# Patient Record
Sex: Male | Born: 1935 | Race: Black or African American | Hispanic: No | Smoking: Former smoker
Health system: Southern US, Community
[De-identification: ages and names within clinical notes are randomized; demographics above are authoritative.]

## PROBLEM LIST (undated history)

## (undated) DIAGNOSIS — R339 Retention of urine, unspecified: Secondary | ICD-10-CM

## (undated) DIAGNOSIS — M1990 Unspecified osteoarthritis, unspecified site: Secondary | ICD-10-CM

## (undated) DIAGNOSIS — I10 Essential (primary) hypertension: Secondary | ICD-10-CM

## (undated) DIAGNOSIS — E0590 Thyrotoxicosis, unspecified without thyrotoxic crisis or storm: Secondary | ICD-10-CM

## (undated) HISTORY — PX: NO PAST SURGERIES: SHX2092

---

## 2009-04-27 ENCOUNTER — Emergency Department (HOSPITAL_COMMUNITY): Admission: EM | Admit: 2009-04-27 | Discharge: 2009-04-27 | Payer: Self-pay | Admitting: Emergency Medicine

## 2009-05-14 ENCOUNTER — Inpatient Hospital Stay (HOSPITAL_COMMUNITY): Admission: AD | Admit: 2009-05-14 | Discharge: 2009-05-20 | Payer: Self-pay | Admitting: Family Medicine

## 2009-05-17 ENCOUNTER — Encounter (INDEPENDENT_AMBULATORY_CARE_PROVIDER_SITE_OTHER): Payer: Self-pay | Admitting: Urology

## 2011-01-03 LAB — BASIC METABOLIC PANEL
BUN: 27 mg/dL — ABNORMAL HIGH (ref 6–23)
CO2: 23 mEq/L (ref 19–32)
CO2: 24 mEq/L (ref 19–32)
Calcium: 8 mg/dL — ABNORMAL LOW (ref 8.4–10.5)
Chloride: 111 mEq/L (ref 96–112)
Chloride: 123 mEq/L — ABNORMAL HIGH (ref 96–112)
GFR calc Af Amer: 37 mL/min — ABNORMAL LOW (ref >60)
GFR calc Af Amer: >60 mL/min (ref >60)
GFR calc Af Amer: >60 mL/min (ref >60)
GFR calc non Af Amer: 5 mL/min — ABNORMAL LOW (ref >60)
GFR calc non Af Amer: 55 mL/min — ABNORMAL LOW (ref >60)
Glucose, Bld: 113 mg/dL — ABNORMAL HIGH (ref 70–99)
Glucose, Bld: 89 mg/dL (ref 70–99)
Glucose, Bld: 94 mg/dL (ref 70–99)
Potassium: 3.7 mEq/L (ref 3.5–5.1)
Potassium: 3.7 mEq/L (ref 3.5–5.1)
Potassium: 3.9 mEq/L (ref 3.5–5.1)
Potassium: 4.1 mEq/L (ref 3.5–5.1)
Sodium: 137 mEq/L (ref 135–145)
Sodium: 137 mEq/L (ref 135–145)
Sodium: 149 mEq/L — ABNORMAL HIGH (ref 135–145)
Sodium: 150 mEq/L — ABNORMAL HIGH (ref 135–145)

## 2011-01-03 LAB — URINE MICROSCOPIC-ADD ON

## 2011-01-03 LAB — URINALYSIS, ROUTINE W REFLEX MICROSCOPIC
Glucose, UA: NEGATIVE mg/dL
Glucose, UA: NEGATIVE mg/dL
Leukocytes, UA: NEGATIVE
Specific Gravity, Urine: 1.025 (ref 1.005–1.030)
pH: 6 (ref 5.0–8.0)
pH: 6 (ref 5.0–8.0)

## 2011-01-03 LAB — CBC
HCT: 31.2 % — ABNORMAL LOW (ref 39.0–52.0)
Hemoglobin: 10.6 g/dL — ABNORMAL LOW (ref 13.0–17.0)
MCHC: 34.1 g/dL (ref 30.0–36.0)
RBC: 3.63 MIL/uL — ABNORMAL LOW (ref 4.22–5.81)
RDW: 15.5 % (ref 11.5–15.5)

## 2011-01-03 LAB — COMPREHENSIVE METABOLIC PANEL
Albumin: 2.4 g/dL — ABNORMAL LOW (ref 3.5–5.2)
BUN: 11 mg/dL (ref 6–23)
Creatinine, Ser: 1.35 mg/dL (ref 0.4–1.5)
Total Protein: 5.8 g/dL — ABNORMAL LOW (ref 6.0–8.3)

## 2011-01-03 LAB — DIFFERENTIAL
Basophils Relative: 0 % (ref 0–1)
Eosinophils Absolute: 0.5 10*3/uL (ref 0.0–0.7)
Eosinophils Relative: 9 % — ABNORMAL HIGH (ref 0–5)
Lymphocytes Relative: 22 % (ref 12–46)
Neutro Abs: 3 10*3/uL (ref 1.7–7.7)

## 2011-01-03 LAB — URINE CULTURE: Culture: NO GROWTH

## 2011-01-04 LAB — URINALYSIS, ROUTINE W REFLEX MICROSCOPIC
Bilirubin Urine: NEGATIVE
Glucose, UA: NEGATIVE mg/dL
Specific Gravity, Urine: 1.015 (ref 1.005–1.030)
pH: 7 (ref 5.0–8.0)

## 2011-01-04 LAB — URINE MICROSCOPIC-ADD ON

## 2011-02-10 NOTE — Discharge Summary (Signed)
NAMELACEY, CHAUSSEE NO.:  [id number]   MEDICAL RECORD NO.:  [id number]          PATIENT TYPE:  INP   LOCATION:  A209                          FACILITY:  [facility]   PHYSICIAN:  Unk Lightning, MDDATE OF BIRTH:  December 28, 1932   DATE OF ADMISSION:  05/14/2009  DATE OF DISCHARGE:  08/23/2010LH                               DISCHARGE SUMMARY   The patient is a 77 year old black male with a history of elevated PSA,  bladder outlet obstruction, hypertension, who had difficulty urinating.  He had a labs drawn, had elevated BUN, and creatinine.  Creatinine was  14 and was essentially bladder outlet obstruction and post obstructive  acute renal failure and Foley was inserted.  He was seen in consultation  by renal and by Urology.  A decompressive renal function returned to  normal over 5 day.  He subsequently had a TURP done by Dr. Michela Pitcher,  hemodynamically stable.  His only problem was hypotension which was  controlled on Norvasc 10 and clonidine 0.2 mg p.o. b.i.d.  He continued  to do well.  He was continued on Flomax 0.4 mg p.o. daily.  He had some  UTI symptoms.  In addition, his lisinopril was continued to 10 mg p.o.  daily.  The patient's renal function returned to normal with BUN of 10  and creatinine of 1.34 on the day of discharge and was subsequently able  to follow up in the office in 4 days time with a Foley inserted.      Unk Lightning, MD  Electronically Signed     RMD/MEDQ  D:  05/20/2009  T:  05/21/2009  Job:  [id number]

## 2011-02-10 NOTE — Op Note (Signed)
NAMEZADE, NORDHOFF NO.:  [id number]   MEDICAL RECORD NO.:  [id number]          PATIENT TYPE:  INP   LOCATION:  A209                          FACILITY:  [facility]   PHYSICIAN:  Marissa Nestle, M.D.DATE OF BIRTH:  [birth year]   DATE OF PROCEDURE:  DATE OF DISCHARGE:                               OPERATIVE REPORT   PREOPERATIVE DIAGNOSIS:  Acute urinary retention.   POSTOPERATIVE DIAGNOSIS:  Benign prostatic hypertrophy with bladder neck  obstruction.   PROCEDURE:  Cystoscopy.   PROCEDURE:  The patient placed in lithotomy position.  After usual prep  and drape, Xylocaine Jelly instilled into the urethra.  After waiting  adequate time, flexible cystoscope was introduced under direct vision.  Anterior urethra looks normal.  Prostatic urethra was completely  obstructed with a lateral lobe hypertrophy.  There was a lot of  inflammatory reaction in the bladder, posterior bladder wall, and the  trigone, but there was no gross tumor in the bladder.  Cystoscope was  removed.  Foley catheter was reinserted.  The patient left the operating  room in satisfactory condition.      Marissa Nestle, M.D.  Electronically Signed     MIJ/MEDQ  D:  05/15/2009  T:  05/16/2009  Job:  [id number]   cc:   Lorriane Shire, MD

## 2011-02-10 NOTE — Group Therapy Note (Signed)
NAMEALMANZO, REDLER NO.:  [id number]   MEDICAL RECORD NO.:  [id number]          PATIENT TYPE:  INP   LOCATION:  A209                          FACILITY:  [facility]   PHYSICIAN:  Estill Bamberg. Karie Kirks, M.D.DATE OF BIRTH:  1933/09/25   DATE OF PROCEDURE:  DATE OF DISCHARGE:                                 PROGRESS NOTE   SUBJECTIVE:  He is generally feeling well.  Did have a TURP for BPH done  2 days ago.   CURRENT MEDICATIONS:  1. Amlodipine 10 mg daily.  2. Cipro 200 mg daily.  3. Clonidine 0.2 mg b.i.d.  4. Lovenox 30 mg daily.  5. Lisinopril 10 mg daily.  6. KCl 40 mEq daily.  7. Tamsulosin 0.4 mg daily.   Admission 4 days ago, his BUN 78, creatinine 11.2,  PSA of 28, and TSH  8.9.  he suppose to be on levothyroxine 15 mcg daily and Azor 10/40 mg  daily, but there is a question of compliant.  Today, the patient really  feels much better and he has not some time.   OBJECTIVE:  VITAL SIGNS:  Temperature is 99.4, pulse of 72, respiratory  rate 16, blood pressure 140/70.  GENERAL:  Sitting up in a chair, having just done a shaving.  He is in  no acute distress.  Well developed, oriented, and alert.  LUNGS:  Are clear throughout moving air well.  HEART:  Regular rhythm, rate of about 70.  ABDOMEN:  Soft without organomegaly or mass or tenderness.  He is no  edema of his ankles.   His BUN today is 10 and creatinine 1.34.  Renal function gradually  improved since admission.  Urine C and S has been negative.   ASSESSMENT:  1. Chronic renal failure secondary to obstructive uropathy.  2. Obstructive uropathy, status post transurethral resection of      prostate 2 days ago.  3. Benign essential hypertension.  4. Hypothyroidism.  5. PSA elevation, which could be secondary to benign prostatic      hypertrophy were to prostate carcinoma.   PLAN:  Continue current medications.  Add back levothyroxine 50 mcg  daily.  Hopefully, his Foley catheter will be able to be  removed either  today or tomorrow.  Probably will not need tamsulosin long term, now he  has had his TURP.      Estill Bamberg. Karie Kirks, M.D.  Electronically Signed     SDK/MEDQ  D:  05/19/2009  T:  05/20/2009  Job:  [id number]

## 2011-02-10 NOTE — Consult Note (Signed)
[name], [name] NO.:  [id number]   MEDICAL RECORD NO.:  [id number]          PATIENT TYPE:  INP   LOCATION:  A209                          FACILITY:  [facility]   PHYSICIAN:  Alison Murray, M.D.DATE OF BIRTH:  01-18-1933   DATE OF CONSULTATION:  05/16/2009  DATE OF DISCHARGE:                                 CONSULTATION   REASON FOR CONSULT:  Renal failure and hypernatremia.   ATTENDING PHYSICIAN:  Unk Lightning, MD   [name] is a 77 years old gentleman who has a history of  hypertension and hypothyroidism.  Initially, he was seen in July of this  year for some abdominal discomfort and unable to go to the bathroom.  During that time, he was found to have urinary tract infection and put  on antibiotics.  Presently, he was brought back because of similar  problem and since the patient was found to have elevated PSA and urinary  obstruction and Dr. Delfino Lovett was called, he will a cystoscopy and put a  catheter and his renal function at this moment seems to be improving.  [name] denies any previous history of renal failure.  He does not  have any nausea or vomiting.   PAST MEDICAL HISTORY:  1. History of hypertension.  He has been on Azor 10/40 mg p.o. daily.  2. History of hypothyroidism.  He was on Synthroid.  3. History of erectile dysfunction.  4. Elevated PSA.  5. History of noncompliance with medications.   ALLERGIES:  No known allergy.   SOCIAL HISTORY:  He does not have any history of smoking.  He quit about  20 years ago.   His medications at this moment consist of:  1. Norvasc 10 mg p.o. daily.  2. Catapres 0.1 mg p.o. b.i.d.  3. Lovenox 30 mg subcu once a day.  4. Prinivil 10 mg p.o. daily.  5. He is getting normal saline at 10-20 mL per hour.  6. He is also on Flomax 0.4 mg p.o. daily.   REVIEW OF SYSTEMS:  At this moment, he offers no complaints.  He does  not have any nausea.  No vomiting.  No fever, chills, or  sweating.  Overall, the patient offers no complaints and he denies any swelling of  the legs.  At this moment, he does not have any urgency or frequency.  He does have any problems outright.   PHYSICAL EXAMINATION:  VITAL SIGNS:  His temperature is 97.1, blood  pressure 153/87, and pulse of 74.  HEENT:  No conjunctival pallor.  Non icterus.  NECK:  Supple.  No JVD.  CHEST:  Clear to auscultation.  No rales or rhonchi.  No egophony.  HEART:  Regular rate and rhythm.  No murmur.  No S3.  ABDOMEN:  Soft.  No palpable organomegaly and he does not have any  tenderness.   His urine output in the last 24 hours is about 4 L.  His sodium today is  150.  His sodium yesterday was 149, potassium 3.7, potassium was 4.1.  His BUN is 27, creatinine 2.15 today and yesterday it  was 78, 11.0, and  calcium of 8.6.  He has the urine done on May 14, 2009, with specific  gravity of 1.01, pH of 6, blood large trace protein, positive nitrite,  small leukocyte, few squamous white blood cell count is 21-50, and few  bacteria.  His CMET and CBC at this moment is not done.  He has  ultrasound which was done on May 14, 2009, the right kidney is 11.5.  He has some increased cortical echogenicity.  Left kidney 11.2, still  with increased echogenicity.  No solid mass.  During that time, the  patient had already a Foley catheter.   ASSESSMENT:  1. Renal failure.  At this moment seems to be acute since he has      increased echogenicity probably might have underlying chronic renal      failure associated with his high blood pressure.  The present      increase in BUN and creatinine seems to be possibly from      obstructive uropathy and seems to be his renal function has      improved significantly.  2. 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  3. History of hypertension.  Presently, the patient is on Norvasc 10      mg p.o. daily, Catapres 0.1 mg p.o. daily,  and Prinivil 10 mg p.o.      daily.  Blood pressure seems to be controlled very well.  4. History of benign prostatic hypertrophy, status post catheter      placement.  He is on Flomax.  5. History of hypothyroidism.  He is on Synthroid.  6. Erectile dysfunction.  7. History of urinary tract infection.  Presently, he is afebrile.   RECOMMENDATIONS:  Because of increasing sodium, we change his IV fluid  to half-normal saline at 175 mL per hour.  We will give him KCl 10 mEq  p.o. daily.  I would repeat his UA and we will do also CBC.  If his  renal function improved at normal level, probably the patient does not  have any further workup.  We will continue his other treatment and we  will follow the patient with you.      Alison Murray, M.D.  Electronically Signed     BB/MEDQ  D:  05/16/2009  T:  05/16/2009  Job:  [id number]

## 2011-02-10 NOTE — Op Note (Signed)
NAMEREA, PARCHEM NO.:  [id number]   MEDICAL RECORD NO.:  [id number]          PATIENT TYPE:  INP   LOCATION:  A209                          FACILITY:  [facility]   PHYSICIAN:  Marissa Nestle, M.D.DATE OF BIRTH:  10/21/1932   DATE OF PROCEDURE:  05/17/2009  DATE OF DISCHARGE:                               OPERATIVE REPORT   PREOPERATIVE DIAGNOSES:  Acute urinary retention, benign prostatic  hypertrophy.   POSTOPERATIVE DIAGNOSES:  Acute urinary retention, benign prostatic  hypertrophy.   PROCEDURE:  Transurethral resection of prostate.   ANESTHESIA:  Spinal.   DESCRIPTION OF PROCEDURE:  The patient was given spinal anesthesia in  lithotomy position.  After usual prep and drape, #28 Iglesias  resectoscope was introduced into the bladder.  The bladder was  inspected.  Resectoscope was pulled back in mid prostatic urethra.  The  bladder neck was circumferentially dissected down to the circular  fibers.  Resectoscope was pulled back at the level of the verumontanum,  rotated to 11 o'clock position.  Resection of the right lobe was done  between 11 and 7 o'clock position.  Similarly, the left lobe was  resected between 1 and 5 o'clock position.  There was a large amount of  tissue in the anterior midline which was resected next.  At the end, I  resected the posterior midline tissue along with the apical tissue, not  to injure the sphincter or the verumontanum.  Prostatic urethra was wide  open.  The bleeders were coagulated.  Chips were evacuated.  Resectoscope was removed and 22 three-way Foley catheter left in for  drainage.  CBI was started, which was clear.  The patient left the  operating room in satisfactory condition.      Marissa Nestle, M.D.  Electronically Signed     MIJ/MEDQ  D:  05/17/2009  T:  05/17/2009  Job:  [id number]   cc:   Unk Lightning, MD  Fax: [phone]

## 2011-02-10 NOTE — H&P (Signed)
[name], [name] NO.:  [id number]   MEDICAL RECORD NO.:  [id number]          PATIENT TYPE:  INP   LOCATION:  A209                          FACILITY:  [facility]   PHYSICIAN:  Unk Lightning, MDDATE OF BIRTH:  [birth year]   DATE OF ADMISSION:  05/14/2009  DATE OF DISCHARGE:  LH                              HISTORY & PHYSICAL   HISTORY OF PRESENT ILLNESS:  The patient is a 77 year old black male who  has been noncompliant with history of hypertension.  Basically he  presented to my office feeling well over several days ago, states he had  some frequency and dysuria and some difficulty voiding.  CMP, CBC and  PSA were taken.  Essentially, he had a grossly elevated BUN and  creatinine of 140 and 36 respectively.  He had symptoms consistent with  bladder obstruction, hyperkalemia 5.70 and a PSA of 28 with elevated TSH  of 8.9, uncontrolled hypertension.  The patient admitted for possible  bladder outlet obstruction, possible post obstructive uropathy, renal  failure, generalized nephrosclerosis and he will have a Foley inserted,  monitor fluid via nose, renal consult for possible dialysis if the renal  function does not improve and adequate control of hypertension.  The  patient denies angina, orthopnea, PND, dizziness or syncope.  He  specifically denies hematuria.   PAST MEDICAL HISTORY:  Significant for hypertension,  erectile  dysfunction, elevated PSA and chronic noncompliance.   CURRENT MEDICATIONS:  Azor 10/40 milligrams p.o. daily and Synthroid 50  mcg p.o. daily of which there is questionable compliance.   ALLERGIES:  He has no known allergies.   SOCIAL HISTORY:  He does not smoke at present.  He smoked for 20 years,  a pack a day.  He is retired.   PHYSICAL EXAMINATION:  Blood pressure is 170/84 pulses 82 and regular.  He is afebrile, respiratory rate is 16.  HEENT:  Eyes: PERRLA.  Extraocular muscles are intact.  NECK:  No JVD, no carotid  bruits, no thyromegaly or thyroid bruits.  LUNGS:  Show prolonged expiratory phase, no rales, wheezes or rhonchi  appreciable.  HEART: Regular rhythm.  No murmurs, gallops, heaves, thrills or rubs.  ABDOMEN:  Soft, nontender.  Bowel sounds are normoactive.  No guarding,  rebound, mass or organomegaly.  EXTREMITIES:  Trace to 1+ pedal edema.  NEUROLOGIC:  There is some mild lethargy.  Cranial nerves 2-12 grossly  intact. Patient moves all extremities, toes are downgoing.   IMPRESSION:  1. Acute on chronic renal failure.  2. Consider postobstructive uropathy, bladder outlet obstruction.  3. Consider renal failure secondary to hypertension.  4. Elevated PSA of 28, consider prostate carcinoma.  5. Hypothyroidism with elevated TSH.  6. Uncontrolled hypertension.   PLAN:  1. The plan is to admit, get renal and urology consults.  2. Insert Foley catheter.  3. Monitor strict I and O's, monitor serial renal functions.  4. Continue Flomax 0.4 mg p.o. daily.  5. Continue Norvasc 10.  6. Will stop Benicar.  7. Will stop the ARB aspect.  8. Get renal ultrasound, urinalysis and I will make further  recommendations as the database expands.      Unk Lightning, MD  Electronically Signed     RMD/MEDQ  D:  05/15/2009  T:  05/15/2009  Job:  [id number]

## 2014-03-14 ENCOUNTER — Other Ambulatory Visit (HOSPITAL_COMMUNITY): Payer: Self-pay | Admitting: Urology

## 2014-03-14 DIAGNOSIS — R3129 Other microscopic hematuria: Secondary | ICD-10-CM

## 2014-03-14 DIAGNOSIS — R972 Elevated prostate specific antigen [PSA]: Secondary | ICD-10-CM

## 2014-03-21 ENCOUNTER — Ambulatory Visit (HOSPITAL_COMMUNITY)
Admission: RE | Admit: 2014-03-21 | Discharge: 2014-03-21 | Disposition: A | Discharge status: Home / Self Care | Payer: PRIVATE HEALTH INSURANCE | Source: Ambulatory Visit | Attending: Urology | Admitting: Urology

## 2014-03-21 DIAGNOSIS — R972 Elevated prostate specific antigen [PSA]: Secondary | ICD-10-CM | POA: Insufficient documentation

## 2014-03-21 DIAGNOSIS — R3129 Other microscopic hematuria: Secondary | ICD-10-CM | POA: Insufficient documentation

## 2014-03-21 DIAGNOSIS — R944 Abnormal results of kidney function studies: Secondary | ICD-10-CM | POA: Insufficient documentation

## 2014-03-21 LAB — POCT I-STAT CREATININE: Creatinine, Ser: 2.3 mg/dL — ABNORMAL HIGH (ref 0.50–1.35)

## 2014-03-21 MED ORDER — SODIUM CHLORIDE 0.9 % IV SOLN
INTRAVENOUS | Status: AC
  Filled 2014-03-21: qty 500

## 2014-03-21 MED ORDER — SODIUM CHLORIDE 0.9 % IJ SOLN
INTRAMUSCULAR | Status: AC
  Filled 2014-03-21: qty 500

## 2014-03-21 MED ORDER — IOHEXOL 300 MG/ML  SOLN
125.0000 mL | Freq: Once | INTRAMUSCULAR | Status: AC | PRN
  Administered 2014-03-21: 125 mL via INTRAVENOUS

## 2014-03-22 ENCOUNTER — Other Ambulatory Visit (HOSPITAL_COMMUNITY): Payer: Self-pay | Admitting: Urology

## 2014-03-22 DIAGNOSIS — R3129 Other microscopic hematuria: Secondary | ICD-10-CM

## 2014-03-27 ENCOUNTER — Ambulatory Visit (HOSPITAL_COMMUNITY): Payer: PRIVATE HEALTH INSURANCE | Attending: Urology

## 2014-04-12 ENCOUNTER — Other Ambulatory Visit (HOSPITAL_COMMUNITY): Payer: PRIVATE HEALTH INSURANCE

## 2014-04-17 ENCOUNTER — Ambulatory Visit (HOSPITAL_COMMUNITY)
Admission: RE | Admit: 2014-04-17 | Discharge: 2014-04-17 | Disposition: A | Discharge status: Home / Self Care | Payer: PRIVATE HEALTH INSURANCE | Source: Ambulatory Visit | Attending: Urology | Admitting: Urology

## 2014-04-17 DIAGNOSIS — N400 Enlarged prostate without lower urinary tract symptoms: Secondary | ICD-10-CM | POA: Insufficient documentation

## 2014-04-17 DIAGNOSIS — R3129 Other microscopic hematuria: Secondary | ICD-10-CM | POA: Diagnosis present

## 2014-04-17 DIAGNOSIS — Q619 Cystic kidney disease, unspecified: Secondary | ICD-10-CM | POA: Diagnosis not present

## 2015-05-31 ENCOUNTER — Ambulatory Visit: Payer: Medicare Other | Admitting: Urology

## 2015-06-12 ENCOUNTER — Ambulatory Visit (INDEPENDENT_AMBULATORY_CARE_PROVIDER_SITE_OTHER): Payer: Medicare Other | Admitting: Urology

## 2015-06-12 ENCOUNTER — Other Ambulatory Visit: Payer: Self-pay | Admitting: Urology

## 2015-06-12 ENCOUNTER — Encounter: Payer: Self-pay | Admitting: Emergency Medicine

## 2015-06-12 DIAGNOSIS — R972 Elevated prostate specific antigen [PSA]: Secondary | ICD-10-CM | POA: Diagnosis not present

## 2015-06-25 ENCOUNTER — Other Ambulatory Visit: Payer: Self-pay | Admitting: Urology

## 2015-06-25 DIAGNOSIS — R972 Elevated prostate specific antigen [PSA]: Secondary | ICD-10-CM

## 2015-07-03 ENCOUNTER — Ambulatory Visit (HOSPITAL_COMMUNITY): Admission: RE | Admit: 2015-07-03 | Payer: Medicare Other | Source: Ambulatory Visit

## 2015-07-24 ENCOUNTER — Ambulatory Visit (HOSPITAL_COMMUNITY)
Admission: RE | Admit: 2015-07-24 | Discharge: 2015-07-24 | Disposition: A | Discharge status: Home / Self Care | Payer: Medicare Other | Source: Ambulatory Visit | Attending: Urology | Admitting: Urology

## 2015-07-24 DIAGNOSIS — R972 Elevated prostate specific antigen [PSA]: Secondary | ICD-10-CM

## 2015-07-24 DIAGNOSIS — C61 Malignant neoplasm of prostate: Secondary | ICD-10-CM | POA: Insufficient documentation

## 2015-07-24 MED ORDER — LIDOCAINE HCL (PF) 2 % IJ SOLN
INTRAMUSCULAR | Status: AC
  Administered 2015-07-24: 10 mL
  Filled 2015-07-24: qty 10

## 2015-07-24 MED ORDER — GENTAMICIN SULFATE 40 MG/ML IJ SOLN
80.0000 mg | Freq: Once | INTRAMUSCULAR | Status: AC
  Administered 2015-07-24: 80 mg via INTRAMUSCULAR

## 2015-07-24 MED ORDER — GENTAMICIN SULFATE 40 MG/ML IJ SOLN
INTRAMUSCULAR | Status: AC
  Filled 2015-07-24: qty 2

## 2015-07-24 NOTE — Discharge Instructions (Signed)
Transrectal Ultrasound-Guided Biopsy A transrectal ultrasound-guided biopsy is a procedure to take samples of tissue from your prostate. Ultrasound images are used to guide the procedure. It is usually done to check the prostate gland for cancer. BEFORE THE PROCEDURE  Do not eat or drink after midnight on the night before your procedure.  Take medicines as your doctor tells you.  Your doctor may have you stop taking some medicines 5-7 days before the procedure.  You will be given an enema before your procedure. During an enema, a liquid is put into your butt (rectum) to clear out waste.  You may have lab tests the day of your procedure.  Make plans to have someone drive you home. PROCEDURE  You will be given medicine to help you relax before the procedure. An IV tube will be put into one of your veins. It will be used to give fluids and medicine.  You will be given medicine to reduce the risk of infection (antibiotic).  You will be placed on your side.  A probe with gel will be put in your butt. This is used to take pictures of your prostate and the area around it.  A medicine to numb the area is put into your prostate.  A biopsy needle is then inserted and guided to your prostate.  Samples of prostate tissue are taken. The needle is removed.  The samples are sent to a lab to be checked. Results are usually back in 2-3 days. AFTER THE PROCEDURE  You will be taken to a room where you will be watched until you are doing okay.  You may have some pain in the area around your butt. You will be given medicines for this.  You may be able to go home the same day. Sometimes, an overnight stay in the hospital is needed.   This information is not intended to replace advice given to you by your health care provider. Make sure you discuss any questions you have with your health care provider.   Document Released: 09/02/2009 Document Revised: 09/19/2013 Document Reviewed:  05/03/2013 Elsevier Interactive Patient Education [employer].

## 2015-08-21 ENCOUNTER — Other Ambulatory Visit: Payer: Self-pay | Admitting: Urology

## 2015-08-21 ENCOUNTER — Ambulatory Visit (INDEPENDENT_AMBULATORY_CARE_PROVIDER_SITE_OTHER): Payer: Medicare Other | Admitting: Urology

## 2015-08-21 DIAGNOSIS — C61 Malignant neoplasm of prostate: Secondary | ICD-10-CM

## 2015-08-21 DIAGNOSIS — R972 Elevated prostate specific antigen [PSA]: Secondary | ICD-10-CM | POA: Diagnosis not present

## 2015-08-29 ENCOUNTER — Encounter (HOSPITAL_COMMUNITY)
Admission: RE | Admit: 2015-08-29 | Discharge: 2015-08-29 | Disposition: A | Discharge status: Home / Self Care | Payer: Medicare Other | Source: Ambulatory Visit | Attending: Urology | Admitting: Urology

## 2015-08-29 DIAGNOSIS — C61 Malignant neoplasm of prostate: Secondary | ICD-10-CM | POA: Insufficient documentation

## 2015-08-29 MED ORDER — TECHNETIUM TC 99M MEDRONATE IV KIT
25.0000 | PACK | Freq: Once | INTRAVENOUS | Status: AC | PRN
  Administered 2015-08-29: 27 via INTRAVENOUS

## 2015-09-03 ENCOUNTER — Ambulatory Visit (HOSPITAL_COMMUNITY)
Admission: RE | Admit: 2015-09-03 | Discharge: 2015-09-03 | Disposition: A | Discharge status: Home / Self Care | Payer: Medicare Other | Source: Ambulatory Visit | Attending: Urology | Admitting: Urology

## 2015-09-03 DIAGNOSIS — C61 Malignant neoplasm of prostate: Secondary | ICD-10-CM | POA: Insufficient documentation

## 2015-09-04 ENCOUNTER — Ambulatory Visit (INDEPENDENT_AMBULATORY_CARE_PROVIDER_SITE_OTHER): Payer: Medicare Other | Admitting: Urology

## 2015-09-04 DIAGNOSIS — R972 Elevated prostate specific antigen [PSA]: Secondary | ICD-10-CM | POA: Diagnosis not present

## 2015-09-04 DIAGNOSIS — C61 Malignant neoplasm of prostate: Secondary | ICD-10-CM | POA: Diagnosis not present

## 2015-09-25 ENCOUNTER — Other Ambulatory Visit: Payer: Self-pay | Admitting: Urology

## 2015-09-25 DIAGNOSIS — C61 Malignant neoplasm of prostate: Secondary | ICD-10-CM

## 2015-10-16 ENCOUNTER — Ambulatory Visit: Payer: Medicare Other | Admitting: Urology

## 2015-11-06 ENCOUNTER — Ambulatory Visit (INDEPENDENT_AMBULATORY_CARE_PROVIDER_SITE_OTHER): Payer: Medicare Other | Admitting: Urology

## 2015-11-06 DIAGNOSIS — C61 Malignant neoplasm of prostate: Secondary | ICD-10-CM

## 2015-11-15 ENCOUNTER — Other Ambulatory Visit: Payer: Self-pay | Admitting: Urology

## 2015-11-15 DIAGNOSIS — C61 Malignant neoplasm of prostate: Secondary | ICD-10-CM

## 2015-11-27 ENCOUNTER — Ambulatory Visit (HOSPITAL_COMMUNITY)
Admission: RE | Admit: 2015-11-27 | Discharge: 2015-11-27 | Disposition: A | Discharge status: Home / Self Care | Payer: Medicare Other | Source: Ambulatory Visit | Attending: Urology | Admitting: Urology

## 2015-11-27 ENCOUNTER — Ambulatory Visit (HOSPITAL_COMMUNITY): Admission: RE | Admit: 2015-11-27 | Payer: Medicare Other | Source: Ambulatory Visit

## 2015-11-27 DIAGNOSIS — C61 Malignant neoplasm of prostate: Secondary | ICD-10-CM

## 2015-11-27 MED ORDER — LIDOCAINE HCL (PF) 2 % IJ SOLN
INTRAMUSCULAR | Status: AC
  Filled 2015-11-27: qty 10

## 2015-11-27 MED ORDER — GENTAMICIN SULFATE 40 MG/ML IJ SOLN
INTRAMUSCULAR | Status: AC
  Administered 2015-11-27: 80 mg via INTRAMUSCULAR
  Filled 2015-11-27: qty 2

## 2015-11-27 MED ORDER — GENTAMICIN SULFATE 40 MG/ML IJ SOLN
80.0000 mg | Freq: Once | INTRAMUSCULAR | Status: AC
  Administered 2015-11-27: 80 mg via INTRAMUSCULAR

## 2015-12-04 ENCOUNTER — Ambulatory Visit: Payer: Medicare Other | Admitting: Urology

## 2015-12-06 ENCOUNTER — Ambulatory Visit (INDEPENDENT_AMBULATORY_CARE_PROVIDER_SITE_OTHER): Payer: Medicare Other | Admitting: Urology

## 2015-12-06 DIAGNOSIS — C61 Malignant neoplasm of prostate: Secondary | ICD-10-CM

## 2016-01-03 ENCOUNTER — Ambulatory Visit (INDEPENDENT_AMBULATORY_CARE_PROVIDER_SITE_OTHER): Payer: Medicare Other | Admitting: Urology

## 2016-01-03 DIAGNOSIS — C61 Malignant neoplasm of prostate: Secondary | ICD-10-CM | POA: Diagnosis not present

## 2016-04-01 ENCOUNTER — Ambulatory Visit: Payer: Medicare Other | Admitting: Urology

## 2016-04-15 ENCOUNTER — Encounter (INDEPENDENT_AMBULATORY_CARE_PROVIDER_SITE_OTHER): Payer: Self-pay | Admitting: *Deleted

## 2016-04-29 ENCOUNTER — Ambulatory Visit: Payer: Medicare Other | Admitting: Urology

## 2016-05-06 ENCOUNTER — Ambulatory Visit (INDEPENDENT_AMBULATORY_CARE_PROVIDER_SITE_OTHER): Payer: Medicare Other | Admitting: Urology

## 2016-05-06 DIAGNOSIS — C61 Malignant neoplasm of prostate: Secondary | ICD-10-CM

## 2016-05-06 DIAGNOSIS — R351 Nocturia: Secondary | ICD-10-CM | POA: Diagnosis not present

## 2016-05-07 ENCOUNTER — Other Ambulatory Visit (INDEPENDENT_AMBULATORY_CARE_PROVIDER_SITE_OTHER): Payer: Self-pay | Admitting: *Deleted

## 2016-05-07 ENCOUNTER — Encounter (INDEPENDENT_AMBULATORY_CARE_PROVIDER_SITE_OTHER): Payer: Self-pay | Admitting: *Deleted

## 2016-05-07 DIAGNOSIS — Z1211 Encounter for screening for malignant neoplasm of colon: Secondary | ICD-10-CM

## 2016-07-30 ENCOUNTER — Telehealth (INDEPENDENT_AMBULATORY_CARE_PROVIDER_SITE_OTHER): Payer: Self-pay | Admitting: *Deleted

## 2016-07-30 ENCOUNTER — Encounter (INDEPENDENT_AMBULATORY_CARE_PROVIDER_SITE_OTHER): Payer: Self-pay | Admitting: *Deleted

## 2016-07-30 MED ORDER — PEG 3350-KCL-NA BICARB-NACL 420 G PO SOLR: 4000.0000 mL | Freq: Once | ORAL | 0 refills | Status: AC

## 2016-07-30 NOTE — Telephone Encounter (Signed)
Patient needs trilyte 

## 2016-08-05 ENCOUNTER — Ambulatory Visit: Payer: Medicare Other | Admitting: Urology

## 2016-08-06 ENCOUNTER — Telehealth (INDEPENDENT_AMBULATORY_CARE_PROVIDER_SITE_OTHER): Payer: Self-pay | Admitting: *Deleted

## 2016-08-06 NOTE — Telephone Encounter (Signed)
agree

## 2016-08-06 NOTE — Telephone Encounter (Signed)
Referring MD/PCP: dondigeo   Procedure: tcs  Reason/Indication:  Screening, fam hx colon ca  Has patient had this procedure before?  no  If so, when, by whom and where?    Is there a family history of colon cancer?  Yes, grandfather  Who?  What age when diagnosed?    Is patient diabetic?   no      Does patient have prosthetic heart valve or mechanical valve?  no  Do you have a pacemaker?  no  Has patient ever had endocarditis? no  Has patient had joint replacement within last 12 months?  no  Does patient tend to be constipated or take laxatives? no  Does patient have a history of alcohol/drug use?  no  Is patient on Coumadin, Plavix and/or Aspirin? yes  Medications: asa 81 mg daily, olmesartan/amlodipine/hctz 40/10/12.5 mg daily, naproxen 500 mg bid, synthroid 125 mcg daily  Allergies: nkda  Medication Adjustment: asa 2 days  Procedure date & time: 09/02/16 at 830

## 2016-09-02 ENCOUNTER — Ambulatory Visit (HOSPITAL_COMMUNITY)
Admission: RE | Admit: 2016-09-02 | Discharge: 2016-09-02 | Disposition: A | Discharge status: Home / Self Care | Payer: Medicare Other | Source: Ambulatory Visit | Attending: Internal Medicine | Admitting: Internal Medicine

## 2016-09-02 ENCOUNTER — Encounter (HOSPITAL_COMMUNITY): Payer: Self-pay

## 2016-09-02 ENCOUNTER — Encounter (HOSPITAL_COMMUNITY)
Admission: RE | Disposition: A | Discharge status: Home / Self Care | Payer: Self-pay | Source: Ambulatory Visit | Attending: Internal Medicine

## 2016-09-02 DIAGNOSIS — Z79899 Other long term (current) drug therapy: Secondary | ICD-10-CM | POA: Insufficient documentation

## 2016-09-02 DIAGNOSIS — D120 Benign neoplasm of cecum: Secondary | ICD-10-CM | POA: Insufficient documentation

## 2016-09-02 DIAGNOSIS — E0590 Thyrotoxicosis, unspecified without thyrotoxic crisis or storm: Secondary | ICD-10-CM | POA: Insufficient documentation

## 2016-09-02 DIAGNOSIS — K5730 Diverticulosis of large intestine without perforation or abscess without bleeding: Secondary | ICD-10-CM | POA: Diagnosis not present

## 2016-09-02 DIAGNOSIS — K648 Other hemorrhoids: Secondary | ICD-10-CM | POA: Insufficient documentation

## 2016-09-02 DIAGNOSIS — K644 Residual hemorrhoidal skin tags: Secondary | ICD-10-CM | POA: Diagnosis not present

## 2016-09-02 DIAGNOSIS — I10 Essential (primary) hypertension: Secondary | ICD-10-CM | POA: Insufficient documentation

## 2016-09-02 DIAGNOSIS — Z1211 Encounter for screening for malignant neoplasm of colon: Secondary | ICD-10-CM | POA: Diagnosis not present

## 2016-09-02 HISTORY — PX: POLYPECTOMY: SHX5525

## 2016-09-02 HISTORY — DX: Essential (primary) hypertension: I10

## 2016-09-02 HISTORY — DX: Thyrotoxicosis, unspecified without thyrotoxic crisis or storm: E05.90

## 2016-09-02 HISTORY — DX: Unspecified osteoarthritis, unspecified site: M19.90

## 2016-09-02 HISTORY — PX: COLONOSCOPY: SHX5424

## 2016-09-02 SURGERY — COLONOSCOPY
Anesthesia: Moderate Sedation

## 2016-09-02 MED ORDER — MIDAZOLAM HCL 5 MG/5ML IJ SOLN
INTRAMUSCULAR | Status: AC
  Filled 2016-09-02: qty 10

## 2016-09-02 MED ORDER — LIDOCAINE VISCOUS 2 % MT SOLN
OROMUCOSAL | Status: AC
  Filled 2016-09-02: qty 15

## 2016-09-02 MED ORDER — SODIUM CHLORIDE 0.9 % IV SOLN
INTRAVENOUS | Status: DC
  Administered 2016-09-02: 08:00:00 via INTRAVENOUS

## 2016-09-02 MED ORDER — MIDAZOLAM HCL 5 MG/5ML IJ SOLN
INTRAMUSCULAR | Status: DC | PRN
  Administered 2016-09-02 (×2): 2 mg via INTRAVENOUS

## 2016-09-02 MED ORDER — MEPERIDINE HCL 100 MG/ML IJ SOLN
INTRAMUSCULAR | Status: AC
  Filled 2016-09-02: qty 2

## 2016-09-02 MED ORDER — MEPERIDINE HCL 50 MG/ML IJ SOLN
INTRAMUSCULAR | Status: AC
  Filled 2016-09-02: qty 1

## 2016-09-02 MED ORDER — ONDANSETRON HCL 4 MG/2ML IJ SOLN
INTRAMUSCULAR | Status: AC
  Filled 2016-09-02: qty 2

## 2016-09-02 MED ORDER — MEPERIDINE HCL 50 MG/ML IJ SOLN
INTRAMUSCULAR | Status: DC | PRN
  Administered 2016-09-02 (×2): 25 mg via INTRAVENOUS

## 2016-09-02 MED ORDER — SIMETHICONE 40 MG/0.6ML PO SUSP
ORAL | Status: DC | PRN
  Administered 2016-09-02: 08:00:00

## 2016-09-02 NOTE — Op Note (Signed)
[facility] Patient Name: [name] Procedure Date: 09/02/2016 8:09 AM MRN: [id number] Date of Birth: [birth year] Attending MD: Hildred Laser , MD CSN: [id number] Age: 82 Admit Type: Outpatient Procedure:                Colonoscopy Indications:              Screening for colorectal malignant neoplasm Providers:                Hildred Laser, MD, Otis Peak B. Sharon Seller, RN, Randa Spike, Technician Referring MD:             Ralene Bathe. Dondiego, MD Medicines:                Meperidine 50 mg IV, Midazolam 4 mg IV Complications:            No immediate complications. Estimated Blood Loss:     Estimated blood loss was minimal. Procedure:                Pre-Anesthesia Assessment:                           - Prior to the procedure, a History and Physical                            was performed, and patient medications and                            allergies were reviewed. The patient's tolerance of                            previous anesthesia was also reviewed. The risks                            and benefits of the procedure and the sedation                            options and risks were discussed with the patient.                            All questions were answered, and informed consent                            was obtained. Prior Anticoagulants: The patient has                            taken no previous anticoagulant or antiplatelet                            agents. ASA Grade Assessment: II - A patient with                            mild systemic disease. After reviewing the risks  and benefits, the patient was deemed in                            satisfactory condition to undergo the procedure.                           After obtaining informed consent, the colonoscope                            was passed under direct vision. Throughout the                            procedure, the patient's blood pressure,  pulse, and                            oxygen saturations were monitored continuously. The                            EC-[id number] ([id number]) scope was introduced through                            the anus and advanced to the the cecum, identified                            by appendiceal orifice and ileocecal valve. The                            colonoscopy was performed without difficulty. The                            patient tolerated the procedure well. The quality                            of the bowel preparation was excellent. The                            ileocecal valve, appendiceal orifice, and rectum                            were photographed. Scope In: 8:25:48 AM Scope Out: 8:44:24 AM Scope Withdrawal Time: 0 hours 8 minutes 8 seconds  Total Procedure Duration: 0 hours 18 minutes 36 seconds  Findings:      The perianal and digital rectal examinations were normal.      A 3 mm polyp was found in the cecum. The polyp was sessile. Biopsies       were taken with a cold forceps for histology.      Scattered medium-mouthed diverticula were found in the hepatic flexure       and ascending colon.      Internal hemorrhoids were found during retroflexion. The hemorrhoids       were medium-sized. Impression:               - One 3 mm polyp in the cecum. Biopsied.                           -  Diverticulosis at the hepatic flexure and in the                            ascending colon.                           - Internal hemorrhoids. Moderate Sedation:      Moderate (conscious) sedation was administered by the endoscopy nurse       and supervised by the endoscopist. The following parameters were       monitored: oxygen saturation, heart rate, blood pressure, CO2       capnography and response to care. Total physician intraservice time was       24 minutes. Recommendation:           - Patient has a contact number available for                            emergencies. The signs and  symptoms of potential                            delayed complications were discussed with the                            patient. Return to normal activities tomorrow.                            Written discharge instructions were provided to the                            patient.                           - Resume previous diet today.                           - Continue present medications.                           - Await pathology results.                           - No repeat colonoscopy due to age. Procedure Code(s):        --- Professional ---                           [phone], Colonoscopy, flexible; with biopsy, single                            or multiple                           99152, Moderate sedation services provided by the                            same physician or other qualified health care  professional performing the diagnostic or                            therapeutic service that the sedation supports,                            requiring the presence of an independent trained                            observer to assist in the monitoring of the                            patient's level of consciousness and physiological                            status; initial 15 minutes of intraservice time,                            patient age 13 years or older                           [phone], Moderate sedation services; each additional                            15 minutes intraservice time Diagnosis Code(s):        --- Professional ---                           Z12.11, Encounter for screening for malignant                            neoplasm of colon                           D12.0, Benign neoplasm of cecum                           K64.8, Other hemorrhoids                           K57.30, Diverticulosis of large intestine without                            perforation or abscess without bleeding CPT copyright 2016 American Medical  Association. All rights reserved. The codes documented in this report are preliminary and upon coder review may  be revised to meet current compliance requirements. Hildred Laser, MD Hildred Laser, MD 09/02/2016 8:50:16 AM This report has been signed electronically. Number of Addenda: 0

## 2016-09-02 NOTE — H&P (Signed)
[name] is an 82 y.o. male.   Chief Complaint: Patient is here for colonoscopy. HPI: Patient is 82 year old African-American male who is here for screening colonoscopy. This is patient's first exam. He denies abdominal pain change in bowel habits or rectal bleeding. Family history significant for CRC in maternal grandfather with T was around 87 years old. Patient has never been screened for CRC before.  Past Medical History:  Diagnosis Date  . Arthritis   . Hypertension   . Hyperthyroidism     Past Surgical History:  Procedure Laterality Date  . NO PAST SURGERIES      Family History  Problem Relation Age of Onset  . Colon cancer Other    Social History:  reports that he has never smoked. He has never used smokeless tobacco. He reports that he does not drink alcohol or use drugs.  Allergies: No Known Allergies  Medications Prior to Admission  Medication Sig Dispense Refill  . Olmesartan-Amlodipine-HCTZ 40-10-12.5 MG TABS Take 1 tablet by mouth daily.    Marland Kitchen SYNTHROID 125 MCG tablet Take 125 mcg by mouth daily.      No results found for this or any previous visit (from the past 48 hour(s)). No results found.  ROS  Blood pressure (!) 174/86, pulse 72, temperature 98.4 F (36.9 C), temperature source Oral, resp. rate (!) 21, height 6' 2.5" (1.892 m), weight 159 lb (72.1 kg), SpO2 99 %. Physical Exam  Constitutional:  Well-developed thin African-American male in NAD.  HENT:  Mouth/Throat: Oropharynx is clear and moist.  Eyes: Conjunctivae are normal. No scleral icterus.  Neck: No thyromegaly present.  Cardiovascular: Normal rate, regular rhythm and normal heart sounds.   No murmur heard. Respiratory: Effort normal and breath sounds normal.  GI:  Abdomen is flat soft and nontender without organomegaly or masses.  Musculoskeletal: He exhibits no edema.  Lymphadenopathy:    He has no cervical adenopathy.  Neurological: He is alert.  Skin: Skin is warm and dry.      Assessment/Plan Average risk screening colonoscopy. First exam.  Hildred Laser, MD 09/02/2016, 8:15 AM

## 2016-09-02 NOTE — Discharge Instructions (Signed)
Resume usual medications and diet. No driving for 24 hours. Physician will call with biopsy results.    Colonoscopy, Adult, Care After This sheet gives you information about how to care for yourself after your procedure. Your health care provider may also give you more specific instructions. If you have problems or questions, contact your health care provider. What can I expect after the procedure? After the procedure, it is common to have:  A small amount of blood in your stool for 24 hours after the procedure.  Some gas.  Mild abdominal cramping or bloating. Follow these instructions at home: General instructions  For the first 24 hours after the procedure:  Do not drive or use machinery.  Do not sign important documents.  Do not drink alcohol.  Do your regular daily activities at a slower pace than normal.  Eat soft, easy-to-digest foods.  Rest often.  Take over-the-counter or prescription medicines only as told by your health care provider.  It is up to you to get the results of your procedure. Ask your health care provider, or the department performing the procedure, when your results will be ready. Relieving cramping and bloating  Try walking around when you have cramps or feel bloated.  Apply heat to your abdomen as told by your health care provider. Use a heat source that your health care provider recommends, such as a moist heat pack or a heating pad.  Place a towel between your skin and the heat source.  Leave the heat on for 20-30 minutes.  Remove the heat if your skin turns bright red. This is especially important if you are unable to feel pain, heat, or cold. You may have a greater risk of getting burned. Eating and drinking  Drink enough fluid to keep your urine clear or pale yellow.  Resume your normal diet as instructed by your health care provider. Avoid heavy or fried foods that are hard to digest.  Avoid drinking alcohol for as long as  instructed by your health care provider. Contact a health care provider if:  You have blood in your stool 2-3 days after the procedure. Get help right away if:  You have more than a small spotting of blood in your stool.  You pass large blood clots in your stool.  Your abdomen is swollen.  You have nausea or vomiting.  You have a fever.  You have increasing abdominal pain that is not relieved with medicine. This information is not intended to replace advice given to you by your health care provider. Make sure you discuss any questions you have with your health care provider. Document Released: 04/28/2004 Document Revised: 06/08/2016 Document Reviewed: 11/26/2015 Elsevier Interactive Patient Education  2017 [place].   Colon Polyps Introduction Polyps are tissue growths inside the body. Polyps can grow in many places, including the large intestine (colon). A polyp may be a round bump or a mushroom-shaped growth. You could have one polyp or several. Most colon polyps are noncancerous (benign). However, some colon polyps can become cancerous over time. What are the causes? The exact cause of colon polyps is not known. What increases the risk? This condition is more likely to develop in people who:  Have a family history of colon cancer or colon polyps.  Are older than 28 or older than 45 if they are African American.  Have inflammatory bowel disease, such as ulcerative colitis or Crohn disease.  Are overweight.  Smoke cigarettes.  Do not get enough exercise.  Drink too much alcohol.  Eat a diet that is:  High in fat and red meat.  Low in fiber.  Had childhood cancer that was treated with abdominal radiation. What are the signs or symptoms? Most polyps do not cause symptoms. If you have symptoms, they may include:  Blood coming from your rectum when having a bowel movement.  Blood in your stool.The stool may look dark red or black.  A change in bowel  habits, such as constipation or diarrhea. How is this diagnosed? This condition is diagnosed with a colonoscopy. This is a procedure that uses a lighted, flexible scope to look at the inside of your colon. How is this treated? Treatment for this condition involves removing any polyps that are found. Those polyps will then be tested for cancer. If cancer is found, your health care provider will talk to you about options for colon cancer treatment. Follow these instructions at home: Diet  Eat plenty of fiber, such as fruits, vegetables, and whole grains.  Eat foods that are high in calcium and vitamin D, such as milk, cheese, yogurt, eggs, liver, fish, and broccoli.  Limit foods high in fat, red meats, and processed meats, such as hot dogs, sausage, bacon, and lunch meats.  Maintain a healthy weight, or lose weight if recommended by your health care provider. General instructions  Do not smoke cigarettes.  Do not drink alcohol excessively.  Keep all follow-up visits as told by your health care provider. This is important. This includes keeping regularly scheduled colonoscopies. Talk to your health care provider about when you need a colonoscopy.  Exercise every day or as told by your health care provider. Contact a health care provider if:  You have new or worsening bleeding during a bowel movement.  You have new or increased blood in your stool.  You have a change in bowel habits.  You unexpectedly lose weight. This information is not intended to replace advice given to you by your health care provider. Make sure you discuss any questions you have with your health care provider. Document Released: 06/10/2004 Document Revised: 02/20/2016 Document Reviewed: 08/05/2015  2017 Elsevier

## 2016-09-08 ENCOUNTER — Encounter (HOSPITAL_COMMUNITY): Payer: Self-pay | Admitting: Internal Medicine

## 2016-11-03 ENCOUNTER — Ambulatory Visit: Payer: Medicare Other | Admitting: Urology

## 2016-11-11 ENCOUNTER — Ambulatory Visit: Payer: Medicare Other | Admitting: Urology

## 2016-12-09 ENCOUNTER — Ambulatory Visit (INDEPENDENT_AMBULATORY_CARE_PROVIDER_SITE_OTHER): Payer: Medicare Other | Admitting: Urology

## 2016-12-09 DIAGNOSIS — R351 Nocturia: Secondary | ICD-10-CM | POA: Diagnosis not present

## 2016-12-09 DIAGNOSIS — C61 Malignant neoplasm of prostate: Secondary | ICD-10-CM

## 2017-06-30 ENCOUNTER — Ambulatory Visit (INDEPENDENT_AMBULATORY_CARE_PROVIDER_SITE_OTHER): Payer: Medicare Other | Admitting: Urology

## 2017-06-30 DIAGNOSIS — C61 Malignant neoplasm of prostate: Secondary | ICD-10-CM | POA: Diagnosis not present

## 2017-06-30 DIAGNOSIS — R351 Nocturia: Secondary | ICD-10-CM | POA: Diagnosis not present

## 2018-01-12 ENCOUNTER — Ambulatory Visit (INDEPENDENT_AMBULATORY_CARE_PROVIDER_SITE_OTHER): Payer: Medicare Other | Admitting: Urology

## 2018-01-12 DIAGNOSIS — R351 Nocturia: Secondary | ICD-10-CM | POA: Diagnosis not present

## 2018-01-12 DIAGNOSIS — C61 Malignant neoplasm of prostate: Secondary | ICD-10-CM

## 2018-02-23 ENCOUNTER — Ambulatory Visit: Payer: Medicare Other | Admitting: Urology

## 2018-07-13 ENCOUNTER — Ambulatory Visit: Payer: Medicare Other | Admitting: Urology

## 2020-07-09 DIAGNOSIS — E039 Hypothyroidism, unspecified: Secondary | ICD-10-CM | POA: Diagnosis not present

## 2020-07-09 DIAGNOSIS — R5382 Chronic fatigue, unspecified: Secondary | ICD-10-CM | POA: Diagnosis not present

## 2020-07-09 DIAGNOSIS — I110 Hypertensive heart disease with heart failure: Secondary | ICD-10-CM | POA: Diagnosis not present

## 2020-07-13 DIAGNOSIS — Z87891 Personal history of nicotine dependence: Secondary | ICD-10-CM | POA: Diagnosis not present

## 2020-07-13 DIAGNOSIS — G3184 Mild cognitive impairment, so stated: Secondary | ICD-10-CM | POA: Diagnosis not present

## 2020-07-13 DIAGNOSIS — E039 Hypothyroidism, unspecified: Secondary | ICD-10-CM | POA: Diagnosis not present

## 2020-07-13 DIAGNOSIS — N529 Male erectile dysfunction, unspecified: Secondary | ICD-10-CM | POA: Diagnosis not present

## 2020-07-13 DIAGNOSIS — N400 Enlarged prostate without lower urinary tract symptoms: Secondary | ICD-10-CM | POA: Diagnosis not present

## 2020-07-13 DIAGNOSIS — Z791 Long term (current) use of non-steroidal anti-inflammatories (NSAID): Secondary | ICD-10-CM | POA: Diagnosis not present

## 2020-07-13 DIAGNOSIS — G8929 Other chronic pain: Secondary | ICD-10-CM | POA: Diagnosis not present

## 2020-07-13 DIAGNOSIS — I10 Essential (primary) hypertension: Secondary | ICD-10-CM | POA: Diagnosis not present

## 2020-08-06 DIAGNOSIS — E039 Hypothyroidism, unspecified: Secondary | ICD-10-CM | POA: Diagnosis not present

## 2020-08-06 DIAGNOSIS — E7849 Other hyperlipidemia: Secondary | ICD-10-CM | POA: Diagnosis not present

## 2020-08-06 DIAGNOSIS — K219 Gastro-esophageal reflux disease without esophagitis: Secondary | ICD-10-CM | POA: Diagnosis not present

## 2020-10-21 ENCOUNTER — Other Ambulatory Visit (HOSPITAL_COMMUNITY): Payer: Self-pay | Admitting: Family Medicine

## 2020-10-21 ENCOUNTER — Ambulatory Visit (HOSPITAL_COMMUNITY)
Admission: RE | Admit: 2020-10-21 | Discharge: 2020-10-21 | Disposition: A | Discharge status: Home / Self Care | Payer: Medicare HMO | Source: Ambulatory Visit | Attending: Family Medicine | Admitting: Family Medicine

## 2020-10-21 ENCOUNTER — Other Ambulatory Visit: Payer: Self-pay

## 2020-10-21 DIAGNOSIS — R634 Abnormal weight loss: Secondary | ICD-10-CM | POA: Insufficient documentation

## 2020-10-21 DIAGNOSIS — D510 Vitamin B12 deficiency anemia due to intrinsic factor deficiency: Secondary | ICD-10-CM | POA: Diagnosis not present

## 2020-10-21 DIAGNOSIS — I10 Essential (primary) hypertension: Secondary | ICD-10-CM | POA: Diagnosis not present

## 2020-10-21 DIAGNOSIS — J439 Emphysema, unspecified: Secondary | ICD-10-CM | POA: Diagnosis not present

## 2020-10-21 DIAGNOSIS — R5383 Other fatigue: Secondary | ICD-10-CM | POA: Diagnosis not present

## 2020-10-21 DIAGNOSIS — R222 Localized swelling, mass and lump, trunk: Secondary | ICD-10-CM

## 2020-10-21 DIAGNOSIS — E559 Vitamin D deficiency, unspecified: Secondary | ICD-10-CM | POA: Diagnosis not present

## 2020-10-21 DIAGNOSIS — I119 Hypertensive heart disease without heart failure: Secondary | ICD-10-CM | POA: Diagnosis not present

## 2020-10-21 DIAGNOSIS — R972 Elevated prostate specific antigen [PSA]: Secondary | ICD-10-CM | POA: Diagnosis not present

## 2020-10-21 DIAGNOSIS — R059 Cough, unspecified: Secondary | ICD-10-CM | POA: Diagnosis not present

## 2020-10-21 DIAGNOSIS — E7849 Other hyperlipidemia: Secondary | ICD-10-CM | POA: Diagnosis not present

## 2020-10-21 DIAGNOSIS — E039 Hypothyroidism, unspecified: Secondary | ICD-10-CM | POA: Diagnosis not present

## 2020-10-22 ENCOUNTER — Other Ambulatory Visit (HOSPITAL_COMMUNITY): Payer: Self-pay | Admitting: Family Medicine

## 2020-10-22 ENCOUNTER — Ambulatory Visit (HOSPITAL_COMMUNITY)
Admission: RE | Admit: 2020-10-22 | Discharge: 2020-10-22 | Disposition: A | Discharge status: Home / Self Care | Payer: Medicare HMO | Source: Ambulatory Visit | Attending: Family Medicine | Admitting: Family Medicine

## 2020-10-22 DIAGNOSIS — R222 Localized swelling, mass and lump, trunk: Secondary | ICD-10-CM | POA: Diagnosis not present

## 2020-10-22 LAB — POCT I-STAT CREATININE: Creatinine, Ser: 2.3 mg/dL — ABNORMAL HIGH (ref 0.61–1.24)

## 2020-10-22 MED ORDER — IOHEXOL 300 MG/ML  SOLN: 75.0000 mL | Freq: Once | INTRAMUSCULAR | Status: DC | PRN

## 2020-10-23 ENCOUNTER — Ambulatory Visit (HOSPITAL_COMMUNITY)
Admission: RE | Admit: 2020-10-23 | Discharge: 2020-10-23 | Disposition: A | Discharge status: Home / Self Care | Payer: Medicare HMO | Source: Ambulatory Visit | Attending: Family Medicine | Admitting: Family Medicine

## 2020-10-23 ENCOUNTER — Other Ambulatory Visit: Payer: Self-pay

## 2020-10-23 DIAGNOSIS — R222 Localized swelling, mass and lump, trunk: Secondary | ICD-10-CM | POA: Diagnosis not present

## 2020-10-23 DIAGNOSIS — R0789 Other chest pain: Secondary | ICD-10-CM | POA: Diagnosis not present

## 2020-10-25 DIAGNOSIS — R5383 Other fatigue: Secondary | ICD-10-CM | POA: Diagnosis not present

## 2020-10-25 DIAGNOSIS — I110 Hypertensive heart disease with heart failure: Secondary | ICD-10-CM | POA: Diagnosis not present

## 2020-10-25 DIAGNOSIS — N400 Enlarged prostate without lower urinary tract symptoms: Secondary | ICD-10-CM | POA: Diagnosis not present

## 2020-10-25 DIAGNOSIS — E559 Vitamin D deficiency, unspecified: Secondary | ICD-10-CM | POA: Diagnosis not present

## 2020-10-25 DIAGNOSIS — D513 Other dietary vitamin B12 deficiency anemia: Secondary | ICD-10-CM | POA: Diagnosis not present

## 2020-10-25 DIAGNOSIS — E039 Hypothyroidism, unspecified: Secondary | ICD-10-CM | POA: Diagnosis not present

## 2020-10-25 DIAGNOSIS — D500 Iron deficiency anemia secondary to blood loss (chronic): Secondary | ICD-10-CM | POA: Diagnosis not present

## 2020-10-25 DIAGNOSIS — D510 Vitamin B12 deficiency anemia due to intrinsic factor deficiency: Secondary | ICD-10-CM | POA: Diagnosis not present

## 2020-10-25 DIAGNOSIS — E7849 Other hyperlipidemia: Secondary | ICD-10-CM | POA: Diagnosis not present

## 2020-10-25 DIAGNOSIS — C3490 Malignant neoplasm of unspecified part of unspecified bronchus or lung: Secondary | ICD-10-CM | POA: Diagnosis not present

## 2020-10-25 DIAGNOSIS — R972 Elevated prostate specific antigen [PSA]: Secondary | ICD-10-CM | POA: Diagnosis not present

## 2020-10-29 DIAGNOSIS — C3490 Malignant neoplasm of unspecified part of unspecified bronchus or lung: Secondary | ICD-10-CM | POA: Diagnosis not present

## 2020-10-29 DIAGNOSIS — E039 Hypothyroidism, unspecified: Secondary | ICD-10-CM | POA: Diagnosis not present

## 2020-11-07 ENCOUNTER — Other Ambulatory Visit: Payer: Self-pay

## 2020-11-07 ENCOUNTER — Ambulatory Visit (INDEPENDENT_AMBULATORY_CARE_PROVIDER_SITE_OTHER): Payer: Medicare HMO | Admitting: Internal Medicine

## 2020-11-07 ENCOUNTER — Institutional Professional Consult (permissible substitution): Payer: Medicare HMO | Admitting: Internal Medicine

## 2020-11-07 ENCOUNTER — Encounter: Payer: Self-pay | Admitting: Internal Medicine

## 2020-11-07 DIAGNOSIS — R918 Other nonspecific abnormal finding of lung field: Secondary | ICD-10-CM | POA: Diagnosis not present

## 2020-11-07 DIAGNOSIS — C3411 Malignant neoplasm of upper lobe, right bronchus or lung: Secondary | ICD-10-CM | POA: Insufficient documentation

## 2020-11-07 NOTE — Progress Notes (Signed)
[name], male    DOB: [birth year],     MRN: [id number]   Brief patient profile:  47 yobm quit smoking 2000 s resp limitations then abruptly mid to late Jan 2022 noted pain under R arm sob/tired and wt loss x 10 lbs so eval by Dr Cindie Laroche with dx of R lung mass and referred to pulmonary clinic in [place] for further eval.      History of Present Illness  11/07/2020  Pulmonary/ 1st office eval/[name]  Chief Complaint  Patient presents with  . Consult    Shortness of breath when working or walking long distances   Dyspnea: stopped shopping x oxsxxn  Cough: clear mucus never bloody Sleep: can't lie on R side due to rib pain / prefers on back flat bed 2 pillows  SABA use: none   No obvious day to day or daytime variability or assoc excess/ purulent sputum or mucus plugs or hemoptysis  or chest tightness, subjective wheeze or overt sinus or hb symptoms.   Sleeping as above without nocturnal  or early am exacerbation  of respiratory  c/o's or need for noct saba. Also denies any obvious fluctuation of symptoms with weather or environmental changes or other aggravating or alleviating factors except as outlined above   No unusual exposure hx or h/o childhood pna/ asthma or knowledge of premature birth.  Current Allergies, Complete Past Medical History, Past Surgical History, Family History, and Social History were reviewed in [employer] record.  ROS  The following are not active complaints unless bolded Hoarseness, sore throat, dysphagia, dental problems, itching, sneezing,  nasal congestion or discharge of excess mucus or purulent secretions, ear ache,   fever, chills, sweats, unintended wt loss or wt gain, classically pleuritic or exertional cp,  orthopnea pnd or arm/hand swelling  or leg swelling, presyncope, palpitations, abdominal pain, anorexia, nausea, vomiting, diarrhea  or change in bowel habits or change in bladder habits, change in stools or change in  urine, dysuria, hematuria,  rash, arthralgias, visual complaints, headache, numbness, weakness or ataxia or problems with walking or coordination,  change in mood or  memory.           Past Medical History:  Diagnosis Date  . Arthritis   . Hypertension   . Hyperthyroidism     Outpatient Medications Prior to Visit  Medication Sig Dispense Refill  . labetalol (NORMODYNE) 200 MG tablet Take 200 mg by mouth 2 (two) times daily.    Marland Kitchen levothyroxine (SYNTHROID) 150 MCG tablet Take 150 mcg by mouth daily before breakfast.    . naproxen (NAPROSYN) 500 MG tablet Take 500 mg by mouth 2 (two) times daily as needed.    . Olmesartan-Amlodipine-HCTZ 40-10-12.5 MG TABS Take 1 tablet by mouth daily.    . tadalafil (CIALIS) 5 MG tablet Take 5 mg by mouth daily.    . tamsulosin (FLOMAX) 0.4 MG CAPS capsule Take 0.4 mg by mouth.    . SYNTHROID 125 MCG tablet Take 125 mcg by mouth daily.     No facility-administered medications prior to visit.     Objective:     BP 136/74 (BP Location: Left Arm, Cuff Size: Normal)   Pulse 67   Temp 97.6 F (36.4 C) (Temporal)   Ht 6\' 2"  (1.88 m)   Wt 149 lb 3.2 oz (67.7 kg)   SpO2 97% Comment: Room air  BMI 19.16 kg/m   SpO2: 97 % (Room air)   Wt Readings from Last  3 Encounters:  11/07/20 149 lb 3.2 oz (67.7 kg)  09/02/16 159 lb (72.1 kg)     General appearance:   Elderly thin bm nad  HEENT : pt wearing mask not removed for exam due to covid - 19 concerns.   NECK :  without JVD/Nodes/TM/ nl carotid upstrokes bilaterally   LUNGS: no acc muscle use,  Min barrel  contour chest wall with bilateral  slightly decreased bs s audible wheeze and  without cough on insp or exp maneuvers and min  Hyperresonant  to  percussion bilaterally     CV:  RRR  no s3 or murmur or increase in P2, and no edema   ABD:  soft and nontender with pos end  insp Hoover's  in the supine position. No bruits or organomegaly appreciated, bowel sounds nl  MS:   Nl gait/  ext warm  without deformities, calf tenderness, cyanosis or clubbing No obvious joint restrictions   SKIN: warm and dry without lesions    NEURO:  alert, approp, nl sensorium with  no motor or cerebellar deficits apparent.         I personally reviewed images and agree with radiology impression as follows:   Chest CT w/o contrast:  10/23/20   1. 5.5 by 7.5 by 5.4 cm invasive right upper lobe chest wall invasive mass with destruction of the right lateral third rib and questionable erosion of the right second rib. Appearance is most compatible with lung cancer with chest wall invasion. 2. Scattered small but asymmetric right axillary and subpectoral lymph nodes, nonspecific. 3. Nonspecific 3.1 cm hypodense lesion of the upper spleen. Possibilities include benign lesion such as hemangioma, lymphoma, or metastatic disease. 4. Heterogeneous enlarged thyroid gland.  5. Coronary, aortic arch, and branch vessel atherosclerotic vascular disease. 6. Severe centrilobular emphysema. 7. Hypodense right renal lesions are likely cysts but technically nonspecific. 8. Foraminal impingement bilaterally at L1-2 and L2-3 primarily due to intervertebral and facet spurring. 9. Emphysema and aortic atherosclerosis.       Assessment   No problem-specific Assessment & Plan notes found for this encounter.     Christinia Gully, MD 11/07/2020

## 2020-11-07 NOTE — Assessment & Plan Note (Signed)
See CT chest  6/43/53  Almost certainly bronchogenic ca ? All limited to RUL/ chest wall - if so he may be resectable though at age 87 likely to do better with less aggressive rx > either way fist step is PET then decide which way to go  Discussed in detail all the  indications, usual  risks and alternatives  relative to the benefits with patient/daughter  who agree  to proceed with w/u as outlined.             Each maintenance medication was reviewed in detail including emphasizing most importantly the difference between maintenance and prns and under what circumstances the prns are to be triggered using an action plan format where appropriate.  Total time for H and P, chart review, counseling, and generating customized AVS unique to this office visit / same day charting = 36 min

## 2020-11-07 NOTE — Patient Instructions (Addendum)
We will to schedule you a PET scan to determine where the easiest spot may be to offer you a biopsy at [facility].

## 2020-11-08 DIAGNOSIS — E039 Hypothyroidism, unspecified: Secondary | ICD-10-CM | POA: Diagnosis not present

## 2020-11-08 DIAGNOSIS — C3490 Malignant neoplasm of unspecified part of unspecified bronchus or lung: Secondary | ICD-10-CM | POA: Diagnosis not present

## 2020-11-08 DIAGNOSIS — I119 Hypertensive heart disease without heart failure: Secondary | ICD-10-CM | POA: Diagnosis not present

## 2020-11-25 ENCOUNTER — Other Ambulatory Visit: Payer: Self-pay

## 2020-11-25 ENCOUNTER — Ambulatory Visit (HOSPITAL_COMMUNITY)
Admission: RE | Admit: 2020-11-25 | Discharge: 2020-11-25 | Disposition: A | Discharge status: Home / Self Care | Payer: Medicare HMO | Source: Ambulatory Visit | Attending: Internal Medicine | Admitting: Internal Medicine

## 2020-11-25 DIAGNOSIS — R918 Other nonspecific abnormal finding of lung field: Secondary | ICD-10-CM

## 2020-11-25 MED ORDER — FLUDEOXYGLUCOSE F - 18 (FDG) INJECTION
7.8900 | Freq: Once | INTRAVENOUS | Status: AC | PRN
  Administered 2020-11-25: 7.89 via INTRAVENOUS

## 2020-11-26 DIAGNOSIS — C801 Malignant (primary) neoplasm, unspecified: Secondary | ICD-10-CM

## 2020-11-26 HISTORY — DX: Malignant (primary) neoplasm, unspecified: C80.1

## 2020-11-29 ENCOUNTER — Institutional Professional Consult (permissible substitution): Payer: Medicare HMO | Admitting: Pulmonary Disease

## 2020-11-29 ENCOUNTER — Other Ambulatory Visit: Payer: Self-pay

## 2020-11-29 DIAGNOSIS — R918 Other nonspecific abnormal finding of lung field: Secondary | ICD-10-CM

## 2020-11-29 NOTE — Progress Notes (Signed)
Called and spoke with patient about Dr Gustavus Bryant recommendation. Patient stated he had talked to his daughter [name] and aware of CT Biopsy being ordered. Order placed per Dr Melvyn Novas. Patient stated to writer to call daughter [name] to schedule. Writer spoke with daughter [name] per patient request, and she is aware of order being placed and someone will call to schedule this. Nothing further needed at this time.

## 2020-12-02 NOTE — Progress Notes (Signed)
Spoke with the pt and he is aware CT bx next step. Order already placed.

## 2020-12-03 ENCOUNTER — Encounter (HOSPITAL_COMMUNITY): Payer: Self-pay

## 2020-12-03 ENCOUNTER — Telehealth: Payer: Self-pay | Admitting: Internal Medicine

## 2020-12-03 NOTE — Telephone Encounter (Signed)
Spoke with the pt's daughter, Ethlyn Daniels She is calling to get details on ct biopsy  It looks like this is scheduled for 12/12/20  PCC's can you please call daughter and give appt details? I could not tell if it was [facility] or [facility] bc notes said both. Thanks!

## 2020-12-03 NOTE — Progress Notes (Unsigned)
HJ    1       [name] Male, 87 y.o., [birth year]  MRN:  [id number] Phone:  [phone] (H)       PCP:  [name], MD [facility] [employer] Hmo/Ppo  Next Appt With Radiology ([facility]-CT 3) 12/12/2020 at 11:00 AM           RE: Biopsy Received: Today  Message Details  Arne Cleveland, MD  Ernestene Mention   CT core bx  R lung/chest wall mass   DDH    Previous Messages  ----- Message -----  From: Lenore Cordia  Sent: 12/02/2020  3:42 PM EST  To: [facility] Procedure Requests  Subject: Biopsy                      Procedure Requested: CT Biopsy    Reason for Procedure: RIght lung mass    Provider Requesting: Dr Christinia Gully  Provider Telephone: [phone]    Other Info:

## 2020-12-03 NOTE — Telephone Encounter (Signed)
I have called the pts daughter and LM on VM for her to call back.

## 2020-12-03 NOTE — Telephone Encounter (Signed)
Unfortunately we don't know the details on this due to these being scheduled by the hospital

## 2020-12-09 ENCOUNTER — Ambulatory Visit (HOSPITAL_COMMUNITY)
Admission: RE | Admit: 2020-12-09 | Discharge: 2020-12-09 | Disposition: A | Discharge status: Home / Self Care | Payer: Medicare HMO | Source: Ambulatory Visit | Attending: Internal Medicine | Admitting: Internal Medicine

## 2020-12-09 ENCOUNTER — Other Ambulatory Visit: Payer: Self-pay

## 2020-12-09 DIAGNOSIS — Z01812 Encounter for preprocedural laboratory examination: Secondary | ICD-10-CM | POA: Diagnosis not present

## 2020-12-09 DIAGNOSIS — Z20822 Contact with and (suspected) exposure to covid-19: Secondary | ICD-10-CM | POA: Diagnosis not present

## 2020-12-09 LAB — SARS CORONAVIRUS 2 (TAT 6-24 HRS): SARS Coronavirus 2: NEGATIVE

## 2020-12-10 ENCOUNTER — Encounter: Payer: Self-pay | Admitting: *Deleted

## 2020-12-10 NOTE — Telephone Encounter (Signed)
Lmtcb for pt's daughter.  

## 2020-12-11 ENCOUNTER — Other Ambulatory Visit: Payer: Self-pay | Admitting: Student

## 2020-12-11 ENCOUNTER — Telehealth: Payer: Self-pay | Admitting: General Practice

## 2020-12-11 NOTE — Telephone Encounter (Signed)
Patients daughter, Isidore Moos, called to get fathers COVID results. Made her aware they were negative.

## 2020-12-11 NOTE — Telephone Encounter (Signed)
Closing per protocol and looks like daughter was called by the hospital according to appt notes.

## 2020-12-12 ENCOUNTER — Ambulatory Visit (HOSPITAL_COMMUNITY)
Admission: RE | Admit: 2020-12-12 | Discharge: 2020-12-12 | Disposition: A | Discharge status: Home / Self Care | Payer: Medicare HMO | Source: Ambulatory Visit | Attending: Internal Medicine | Admitting: Internal Medicine

## 2020-12-12 ENCOUNTER — Encounter (HOSPITAL_COMMUNITY): Payer: Self-pay

## 2020-12-12 ENCOUNTER — Other Ambulatory Visit: Payer: Self-pay

## 2020-12-12 ENCOUNTER — Ambulatory Visit (HOSPITAL_COMMUNITY)
Admission: RE | Admit: 2020-12-12 | Discharge: 2020-12-12 | Disposition: A | Discharge status: Home / Self Care | Payer: Medicare HMO | Source: Ambulatory Visit | Attending: Diagnostic Radiology | Admitting: Diagnostic Radiology

## 2020-12-12 DIAGNOSIS — Z87891 Personal history of nicotine dependence: Secondary | ICD-10-CM | POA: Insufficient documentation

## 2020-12-12 DIAGNOSIS — Z800 Family history of malignant neoplasm of digestive organs: Secondary | ICD-10-CM | POA: Insufficient documentation

## 2020-12-12 DIAGNOSIS — Z79899 Other long term (current) drug therapy: Secondary | ICD-10-CM | POA: Diagnosis not present

## 2020-12-12 DIAGNOSIS — Z79890 Hormone replacement therapy (postmenopausal): Secondary | ICD-10-CM | POA: Diagnosis not present

## 2020-12-12 DIAGNOSIS — E039 Hypothyroidism, unspecified: Secondary | ICD-10-CM | POA: Insufficient documentation

## 2020-12-12 DIAGNOSIS — C3411 Malignant neoplasm of upper lobe, right bronchus or lung: Secondary | ICD-10-CM | POA: Diagnosis not present

## 2020-12-12 DIAGNOSIS — C761 Malignant neoplasm of thorax: Secondary | ICD-10-CM | POA: Diagnosis not present

## 2020-12-12 DIAGNOSIS — R222 Localized swelling, mass and lump, trunk: Secondary | ICD-10-CM | POA: Diagnosis not present

## 2020-12-12 DIAGNOSIS — I10 Essential (primary) hypertension: Secondary | ICD-10-CM | POA: Insufficient documentation

## 2020-12-12 DIAGNOSIS — R918 Other nonspecific abnormal finding of lung field: Secondary | ICD-10-CM | POA: Diagnosis not present

## 2020-12-12 DIAGNOSIS — C3492 Malignant neoplasm of unspecified part of left bronchus or lung: Secondary | ICD-10-CM | POA: Diagnosis not present

## 2020-12-12 DIAGNOSIS — Z9889 Other specified postprocedural states: Secondary | ICD-10-CM

## 2020-12-12 DIAGNOSIS — J439 Emphysema, unspecified: Secondary | ICD-10-CM | POA: Diagnosis not present

## 2020-12-12 LAB — CBC
HCT: 33.3 % — ABNORMAL LOW (ref 39.0–52.0)
Hemoglobin: 10.5 g/dL — ABNORMAL LOW (ref 13.0–17.0)
MCH: 27.9 pg (ref 26.0–34.0)
MCHC: 31.5 g/dL (ref 30.0–36.0)
MCV: 88.3 fL (ref 80.0–100.0)
Platelets: 351 10*3/uL (ref 150–400)
RBC: 3.77 MIL/uL — ABNORMAL LOW (ref 4.22–5.81)
RDW: 15.1 % (ref 11.5–15.5)
WBC: 8.4 10*3/uL (ref 4.0–10.5)
nRBC: 0 % (ref 0.0–0.2)

## 2020-12-12 LAB — PROTIME-INR
INR: 1.1 (ref 0.8–1.2)
Prothrombin Time: 14.1 seconds (ref 11.4–15.2)

## 2020-12-12 MED ORDER — SODIUM CHLORIDE 0.9 % IV SOLN: INTRAVENOUS | Status: DC

## 2020-12-12 MED ORDER — LIDOCAINE HCL 1 % IJ SOLN
INTRAMUSCULAR | Status: AC
  Filled 2020-12-12: qty 20

## 2020-12-12 MED ORDER — FENTANYL CITRATE (PF) 100 MCG/2ML IJ SOLN
INTRAMUSCULAR | Status: AC
  Filled 2020-12-12: qty 2

## 2020-12-12 MED ORDER — MIDAZOLAM HCL 2 MG/2ML IJ SOLN
INTRAMUSCULAR | Status: AC
  Filled 2020-12-12: qty 2

## 2020-12-12 MED ORDER — FENTANYL CITRATE (PF) 100 MCG/2ML IJ SOLN
INTRAMUSCULAR | Status: AC | PRN
  Administered 2020-12-12: 25 ug via INTRAVENOUS

## 2020-12-12 MED ORDER — HYDROCODONE-ACETAMINOPHEN 5-325 MG PO TABS
1.0000 | ORAL_TABLET | ORAL | Status: DC | PRN
Start: 2020-12-12 — End: 2020-12-13

## 2020-12-12 MED ORDER — MIDAZOLAM HCL 2 MG/2ML IJ SOLN
INTRAMUSCULAR | Status: AC | PRN
  Administered 2020-12-12: 0.5 mg via INTRAVENOUS

## 2020-12-12 NOTE — Procedures (Signed)
Interventional Radiology Procedure:   Indications:  Right lung / chest wall mass  Procedure: CT guided chest wall mass biopsy   Findings: Right chest wall mass.  3 cores.  Complications: None     EBL: less than 10 ml  Plan: Discharge in 2 hours   Min Tunnell R. Anselm Pancoast, MD  Pager: [phone]

## 2020-12-12 NOTE — Discharge Instructions (Addendum)
Lung Biopsy, Care After This information will help you take care of yourself after your procedure. Your health care provider may also give you more specific instructions depending on the type of biopsy you had. If you have problems or questions, contact your health care provider. What can I expect after the procedure? After the procedure, it is common to have:  A cough.  A sore throat.  Pain where a needle or incision was used to collect a biopsy sample (biopsy site). Follow these instructions at home: Medicines  Take over-the-counter and prescription medicines only as told by your health care provider.  Talk to your health care provider before you take any medicines that contain aspirin or NSAIDS, such as ibuprofen. These medicines can increase your risk of bleeding.  Ask your health care provider if the medicine prescribed to you: ? Requires you to avoid driving or using machinery. ? Can cause constipation. You may need to take these actions to prevent or treat constipation:  Drink enough fluid to keep your urine pale yellow.  Take over-the-counter or prescription medicines.  Eat foods that are high in fiber, such as beans, whole grains, fresh fruits and vegetables.  Limit foods that are high in fat and processed sugars, such as fried or sweet foods. Biopsy site care  If you had a needle or open biopsy, follow instructions from your health care provider about how to take care of your biopsy site. Make sure you: ? Wash your hands with soap and water for at least 20 seconds before and after you change your bandage (dressing). If soap and water are not available, use hand sanitizer. ? Change your dressing as told by your health care provider. ? Leave stitches (sutures), skin glue, or adhesive strips in place for as long as you are told. If adhesive strip edges start to loosen and curl up, you may trim the loose edges. Do not remove adhesive strips completely unless your health care  provider tells you to do that.  Do not take baths, swim, or use a hot tub until your health care provider approves. Ask your health care provider if you may take showers. You may only be allowed to take sponge baths.  Check your biopsy site every day for signs of infection. Check for: ? Redness, swelling, or more pain. ? Fluid or blood. ? Warmth. ? Pus or a bad smell.   General instructions  Do not drink alcohol if your health care provider tells you not to drink.  If you were given a sedative during the procedure, it can affect you for several hours. Do not drive or operate machinery until your health care provider says that it is safe.  Return to your normal activities as told by your health care provider. Ask your health care provider what activities are safe for you.  It is up to you to get the results of your procedure. Ask your health care provider, or the department that is doing the procedure, when your results will be ready.  Keep all follow-up visits as told by your health care provider. This is important. Contact a health care provider if:  You have a fever.  You have redness, swelling, or more pain around your biopsy site.  You have fluid or blood coming from your biopsy site.  Your biopsy site feels warm to the touch.  You have pus or a bad smell coming from your biopsy site.  You have pain that does not get better with medicine.  Get help right away if:  You cough up blood.  You have trouble breathing.  You have chest pain.  You lose consciousness. These symptoms may represent a serious problem that is an emergency. Do not wait to see if the symptoms will go away. Get medical help right away. Call your local emergency services (911 in the [place] Do not drive yourself to the hospital. Summary  It is common to have some pain where a needle or incision was used to collect a biopsy sample (biopsy site).  Return to your normal activities as told by your health  care provider. Ask your health care provider what activities are safe for you.  Take over-the-counter and prescription medicines only as told by your health care provider.  Report any unusual symptoms to your health care provider. This information is not intended to replace advice given to you by your health care provider. Make sure you discuss any questions you have with your health care provider. Document Revised: 08/26/2019 Document Reviewed: 08/26/2019 Elsevier Patient Education  2021 [employer]. Moderate Conscious Sedation, Adult Sedation is the use of medicines to promote relaxation and to relieve discomfort and anxiety. Moderate conscious sedation is a type of sedation. Under moderate conscious sedation, you are less alert than normal, but you are still able to respond to instructions, touch, or both. Moderate conscious sedation is used during short medical and dental procedures. It is milder than deep sedation, which is a type of sedation under which you cannot be easily woken up. It is also milder than general anesthesia, which is the use of medicines to make you unconscious. Moderate conscious sedation allows you to return to your regular activities sooner. Tell a health care provider about:  Any allergies you have.  All medicines you are taking, including vitamins, herbs, eye drops, creams, and over-the-counter medicines.  Any use of steroids. This includes steroids taken by mouth or as a cream.  Any problems you or family members have had with sedatives and anesthetic medicines.  Any blood disorders you have.  Any surgeries you have had.  Any medical conditions you have, such as sleep apnea.  Whether you are pregnant or may be pregnant.  Any use of cigarettes, alcohol, marijuana, or drugs. What are the risks? Generally, this is a safe procedure. However, problems may occur, including:  Getting too much medicine (oversedation).  Nausea.  Allergic reaction to  medicines.  Trouble breathing. If this happens, a breathing tube may be used. It will be removed when you are awake and breathing on your own.  Heart trouble.  Lung trouble.  Confusion that gets better with time (emergence delirium). What happens before the procedure? Staying hydrated Follow instructions from your health care provider about hydration, which may include:  Up to 2 hours before the procedure - you may continue to drink clear liquids, such as water, clear fruit juice, black coffee, and plain tea. Eating and drinking restrictions Follow instructions from your health care provider about eating and drinking, which may include:  8 hours before the procedure - stop eating heavy meals or foods, such as meat, fried foods, or fatty foods.  6 hours before the procedure - stop eating light meals or foods, such as toast or cereal.  6 hours before the procedure - stop drinking milk or drinks that contain milk.  2 hours before the procedure - stop drinking clear liquids. Medicines Ask your health care provider about:  Changing or stopping your regular medicines. This is especially  important if you are taking diabetes medicines or blood thinners.  Taking medicines such as aspirin and ibuprofen. These medicines can thin your blood. Do not take these medicines unless your health care provider tells you to take them.  Taking over-the-counter medicines, vitamins, herbs, and supplements. Tests and exams  You will have a physical exam.  You may have blood tests done to show how well: ? Your kidneys and liver work. ? Your blood clots. General instructions  Plan to have a responsible adult take you home from the hospital or clinic.  If you will be going home right after the procedure, plan to have a responsible adult care for you for the time you are told. This is important. What happens during the procedure?  You will be given the sedative. The sedative may be given: ? As a  pill that you will swallow. It can also be inserted into the rectum. ? As a spray through the nose. ? As an injection into the muscle. ? As an injection into the vein through an IV.  You may be given oxygen as needed.  Your breathing, heart rate, and blood pressure will be monitored during the procedure.  The medical or dental procedure will be done. The procedure may vary among health care providers and hospitals.   What happens after the procedure?  Your blood pressure, heart rate, breathing rate, and blood oxygen level will be monitored until you leave the hospital or clinic.  You will get fluids through your IV if needed.  Do not drive or operate machinery until your health care provider says that it is safe. Summary  Sedation is the use of medicines to promote relaxation and to relieve discomfort and anxiety. Moderate conscious sedation is a type of sedation that is used during short medical and dental procedures.  Tell the health care provider about any medical conditions that you have and about all the medicines that you are taking.  You will be given the sedative as a pill, a spray through the nose, an injection into the muscle, or an injection into the vein through an IV. Vital signs are monitored during the sedation.  Moderate conscious sedation allows you to return to your regular activities sooner. This information is not intended to replace advice given to you by your health care provider. Make sure you discuss any questions you have with your health care provider. Document Revised: 01/12/2020 Document Reviewed: 08/10/2019 Elsevier Patient Education  2021 [employer].

## 2020-12-12 NOTE — Progress Notes (Signed)
Discharge instructions reviewed with patient and family. Verbalized understanding. 

## 2020-12-12 NOTE — H&P (Addendum)
Chief Complaint: Right lung/chest wall mass  Referring Physician(s): Wert  Supervising Physician: Markus Daft  Patient Status: [facility] - Out-pt  History of Present Illness: [name] is a 87 y.o. male with medical history including arthritis, hypertension, and hypothyroidism. He is a former smoker and stopped smoking in 2000.  He was seen by Dr. Christinia Gully on November 07, 2020 for evaluation of a right chest wall mass seen on CT scan.  CT scan was obtained after the patient noticed pain under the right arm.  He also complained of a 10 pound weight loss.  The CT scan done on October 23, 2020 revealed a 7.5 x 5.5 right upper lobe chest wall invasive mass with destruction of the right lateral third rib and questionable erosion of the right second rib.  Appearance is most compatible with lung cancer with chest wall invasion.    PET scan done on November 26, 2020 showed intensely hypermetabolic 8 cm mass involving the right upper lobe and chest wall.      He is here today for image guided biopsy of the mass.  He is n.p.o. he does not take any blood thinners. No nausea/vomiting. No Fever/chills. ROS negative.   Past Medical History:  Diagnosis Date  . Arthritis   . Hypertension   . Hyperthyroidism     Past Surgical History:  Procedure Laterality Date  . COLONOSCOPY N/A 09/02/2016   Procedure: COLONOSCOPY;  Surgeon: Rogene Houston, MD;  Location: [facility] ENDO SUITE;  Service: Endoscopy;  Laterality: N/A;  830  . NO PAST SURGERIES    . POLYPECTOMY  09/02/2016   Procedure: POLYPECTOMY;  Surgeon: Rogene Houston, MD;  Location: [facility] ENDO SUITE;  Service: Endoscopy;;    Allergies: Patient has no known allergies.  Medications: Prior to Admission medications   Medication Sig Start Date End Date Taking? Authorizing Provider  labetalol (NORMODYNE) 200 MG tablet Take 200 mg by mouth 2 (two) times daily.   Yes [provider]  levothyroxine (SYNTHROID) 150 MCG tablet  Take 150 mcg by mouth daily before breakfast.   Yes [provider]  naproxen (NAPROSYN) 500 MG tablet Take 500 mg by mouth 2 (two) times daily as needed for moderate pain.   Yes [provider]  Olmesartan-Amlodipine-HCTZ 40-10-12.5 MG TABS Take 1 tablet by mouth daily. 07/11/16  Yes [provider]  tadalafil (CIALIS) 5 MG tablet Take 5 mg by mouth daily.   Yes [provider]  tamsulosin (FLOMAX) 0.4 MG CAPS capsule Take 0.4 mg by mouth daily.   Yes [provider]     Family History  Problem Relation Age of Onset  . Colon cancer Other     Social History   Socioeconomic History  . Marital status: Divorced    Spouse name: Not on file  . Number of children: Not on file  . Years of education: Not on file  . Highest education level: Not on file  Occupational History  . Not on file  Tobacco Use  . Smoking status: Former Smoker    Packs/day: 1.50    Years: 40.00    Pack years: 60.00    Types: Cigarettes    Quit date: 2000    Years since quitting: 22.2  . Smokeless tobacco: Never Used  Substance and Sexual Activity  . Alcohol use: No  . Drug use: No  . Sexual activity: Not on file  Other Topics Concern  . Not on file  Social History  Narrative  . Not on file   Social Determinants of Health   Financial Resource Strain: Not on file  Food Insecurity: Not on file  Transportation Needs: Not on file  Physical Activity: Not on file  Stress: Not on file  Social Connections: Not on file     Review of Systems: A 12 point ROS discussed and pertinent positives are indicated in the HPI above.  All other systems are negative.  Review of Systems  Vital Signs: BP 135/65   Pulse (!) 59   Temp 97.7 F (36.5 C) (Oral)   Ht 6\' 2"  (1.88 m)   Wt 65.8 kg   SpO2 100%   BMI 18.62 kg/m   Physical Exam Vitals reviewed.  Constitutional:      Appearance: Normal appearance.  HENT:     Head: Normocephalic and atraumatic.  Eyes:      Extraocular Movements: Extraocular movements intact.  Cardiovascular:     Rate and Rhythm: Normal rate and regular rhythm.  Pulmonary:     Effort: Pulmonary effort is normal. No respiratory distress.     Breath sounds: Normal breath sounds.  Abdominal:     General: There is no distension.     Palpations: Abdomen is soft.     Tenderness: There is no abdominal tenderness.  Musculoskeletal:        General: Normal range of motion.     Cervical back: Normal range of motion.  Skin:    General: Skin is warm and dry.  Neurological:     General: No focal deficit present.     Mental Status: He is alert and oriented to person, place, and time.  Psychiatric:        Mood and Affect: Mood normal.        Behavior: Behavior normal.        Thought Content: Thought content normal.        Judgment: Judgment normal.     Imaging: NM PET Image Initial (PI) Skull Base To Thigh  Result Date: 11/26/2020 CLINICAL DATA:  Initial treatment strategy for right lung mass. EXAM: NUCLEAR MEDICINE PET SKULL BASE TO THIGH TECHNIQUE: 7.9 mCi F-18 FDG was injected intravenously. Full-ring PET imaging was performed from the skull base to thigh after the radiotracer. CT data was obtained and used for attenuation correction and anatomic localization. Fasting blood glucose: 80 mg/dl COMPARISON:  Chest CT on 10/23/2020 FINDINGS: Mediastinal blood-pool activity (background): SUV max = 1.9 Liver activity (reference): SUV max = N/A NECK: Mild FDG uptake is seen within a sub-cm right supraclavicular lymph node, with SUV max of 2.6. Moderately goiter with substernal extension is seen which shows diffuse hypermetabolic activity, with SUV max of 12.4. Incidental CT findings:  None. CHEST: A large hypermetabolic mass is seen which involves the lateral right upper lobe and right chest wall soft tissues. This measures 7.8 x 6.9 cm on image 90/3, with SUV max of 38.0. A 9 mm right subpectoral lymph node is seen on image 79/3, which is  hypermetabolic with SUV max of 8.3. Mild hypermetabolic right hilar lymphadenopathy is seen with SUV max of 3.8. Sub-cm hypermetabolic mediastinal lymph nodes are seen in AP window, low right paratracheal region, and paraesophageal space the level of the carina. SUV max of index lymph nodes in the AP window measures 3.6. Incidental CT findings: Moderate to severe paraseptal emphysema. No evidence of pleural effusion. ABDOMEN/PELVIS: No abnormal hypermetabolic activity within the liver, pancreas, adrenal glands, or spleen. No hypermetabolic lymph nodes in  the abdomen or pelvis. Incidental CT findings: Aortic atherosclerotic calcification noted. Mild to moderately enlarged prostate gland. Diffuse bladder wall thickening, consistent with chronic bladder outlet obstruction. SKELETON: No focal hypermetabolic bone lesions to suggest skeletal metastasis. Incidental CT findings:  None. IMPRESSION: 8 cm hypermetabolic mass involving the lateral right upper lobe and right chest wall soft tissues, most likely representing primary bronchogenic carcinoma with chest wall invasion. Mild hypermetabolic lymphadenopathy in right hilum, mediastinum, and right subpectoral and supraclavicular regions, consistent with metastatic disease. No evidence of metastatic disease within the abdomen or pelvis. Moderate multinodular goiter with diffuse hypermetabolic activity. Recommend correlation with thyroid function tests. Electronically Signed   By: Marlaine Hind M.D.   On: 11/26/2020 11:56    Labs:  CBC: Recent Labs    12/12/20 0917  WBC 8.4  HGB 10.5*  HCT 33.3*  PLT 351    COAGS: Recent Labs    12/12/20 0917  INR 1.1    BMP: Recent Labs    10/22/20 0948  CREATININE 2.30*    LIVER FUNCTION TESTS: No results for input(s): BILITOT, AST, ALT, ALKPHOS, PROT, ALBUMIN in the last 8760 hours.  TUMOR MARKERS: No results for input(s): AFPTM, CEA, CA199, CHROMGRNA in the last 8760 hours.  Assessment and Plan:  Right  upper lobe chest wall mass.  We will proceed with image guided biopsy of the mass today by Dr. Anselm Pancoast.  Risks and benefits of biopsy of chest wall mass was discussed with the patient and/or patient's family including, but not limited to bleeding, infection, damage to adjacent structures or low yield requiring additional tests.  All of the questions were answered and there is agreement to proceed.  Consent signed and in chart.  Thank you for this interesting consult.  I greatly enjoyed meeting [name] and look forward to participating in their care.  A copy of this report was sent to the requesting provider on this date.  Electronically Signed: Murrell Redden, PA-C   12/12/2020, 10:48 AM  Obviously not    I spent a total of  30 Minutes  in face to face in clinical consultation, greater than 50% of which was counseling/coordinating care for image guided biopsy of right lung/chest wall mass

## 2020-12-16 ENCOUNTER — Telehealth: Payer: Self-pay | Admitting: Internal Medicine

## 2020-12-16 DIAGNOSIS — R918 Other nonspecific abnormal finding of lung field: Secondary | ICD-10-CM

## 2020-12-16 LAB — SURGICAL PATHOLOGY

## 2020-12-16 NOTE — Telephone Encounter (Signed)
Spoke with the [name] She states Dr Melvyn Novas called her to go over results and she could not answer  She wants to be called tomorrow am after 9:30  Will forward to Dr Melvyn Novas to call her [phone] (M)

## 2020-12-17 NOTE — Telephone Encounter (Signed)
Pt returning call . She states between 9:30-10 am is the best time to reach her.  please advise call back  [phone]

## 2020-12-17 NOTE — Telephone Encounter (Signed)
lmom > let's go ahead and refer to [facility]

## 2020-12-17 NOTE — Telephone Encounter (Signed)
Daughter called back- 9:30 - 10 am best call back time. Will wait until MD speaks with her before placing referral per protocol.

## 2020-12-18 NOTE — Telephone Encounter (Signed)
Referral made, marked urgent.

## 2020-12-18 NOTE — Telephone Encounter (Signed)
Discussed with daughter > ok to refer to [facility]

## 2020-12-18 NOTE — Telephone Encounter (Signed)
Daughter called back again and states called her this afternoon before 2 pm if possible  Let me know once you spoke with her so [place] referral can be made

## 2020-12-19 ENCOUNTER — Telehealth: Payer: Self-pay | Admitting: Internal Medicine

## 2020-12-19 ENCOUNTER — Other Ambulatory Visit: Payer: Self-pay | Admitting: *Deleted

## 2020-12-19 ENCOUNTER — Encounter: Payer: Self-pay | Admitting: *Deleted

## 2020-12-19 ENCOUNTER — Telehealth: Payer: Self-pay | Admitting: *Deleted

## 2020-12-19 DIAGNOSIS — R918 Other nonspecific abnormal finding of lung field: Secondary | ICD-10-CM

## 2020-12-19 NOTE — Telephone Encounter (Signed)
Molecular[facility] and PDL 1 requested for pathology to send [facility].

## 2020-12-19 NOTE — Progress Notes (Signed)
I received referral on [name] today.  I updated new patient coordinator to call and schedule him to be seen on 12/24/20 labs at 1:15 and to see Cassie at 1:45.

## 2020-12-19 NOTE — Telephone Encounter (Signed)
Received an urgent new pt referral from Dr. Melvyn Novas for mass of upper lobe of right lung.[name] has been scheduled to see Dr. Julien Nordmann on 3/28 at 3:15pm w/labs at 245pm. Appt date and time has been given to the pt's daughter. Aware to arrive 15 minutes early.

## 2020-12-19 NOTE — Progress Notes (Signed)
The proposed treatment discussed in cancer conference is for discussion purpose only and is not a binding recommendation. The patient was not physically examined nor present for their treatment options. Therefore, final treatment plans cannot be decided.  ?

## 2020-12-23 ENCOUNTER — Encounter: Payer: Self-pay | Admitting: Emergency Medicine

## 2020-12-23 ENCOUNTER — Encounter: Payer: Self-pay | Admitting: Internal Medicine

## 2020-12-23 ENCOUNTER — Ambulatory Visit
Admission: RE | Admit: 2020-12-23 | Discharge: 2020-12-23 | Disposition: A | Discharge status: Home / Self Care | Payer: Medicare HMO | Source: Ambulatory Visit | Attending: Radiation Oncology | Admitting: Radiation Oncology

## 2020-12-23 ENCOUNTER — Inpatient Hospital Stay: Payer: Medicare HMO

## 2020-12-23 ENCOUNTER — Other Ambulatory Visit: Payer: Self-pay

## 2020-12-23 ENCOUNTER — Inpatient Hospital Stay: Payer: Medicare HMO | Attending: Internal Medicine | Admitting: Internal Medicine

## 2020-12-23 DIAGNOSIS — C778 Secondary and unspecified malignant neoplasm of lymph nodes of multiple regions: Secondary | ICD-10-CM | POA: Diagnosis not present

## 2020-12-23 DIAGNOSIS — Z809 Family history of malignant neoplasm, unspecified: Secondary | ICD-10-CM

## 2020-12-23 DIAGNOSIS — Z5111 Encounter for antineoplastic chemotherapy: Secondary | ICD-10-CM | POA: Insufficient documentation

## 2020-12-23 DIAGNOSIS — R52 Pain, unspecified: Secondary | ICD-10-CM | POA: Diagnosis not present

## 2020-12-23 DIAGNOSIS — Z87891 Personal history of nicotine dependence: Secondary | ICD-10-CM | POA: Diagnosis not present

## 2020-12-23 DIAGNOSIS — M8588 Other specified disorders of bone density and structure, other site: Secondary | ICD-10-CM

## 2020-12-23 DIAGNOSIS — C3490 Malignant neoplasm of unspecified part of unspecified bronchus or lung: Secondary | ICD-10-CM

## 2020-12-23 DIAGNOSIS — E039 Hypothyroidism, unspecified: Secondary | ICD-10-CM

## 2020-12-23 DIAGNOSIS — R918 Other nonspecific abnormal finding of lung field: Secondary | ICD-10-CM

## 2020-12-23 DIAGNOSIS — C7989 Secondary malignant neoplasm of other specified sites: Secondary | ICD-10-CM | POA: Diagnosis not present

## 2020-12-23 DIAGNOSIS — I10 Essential (primary) hypertension: Secondary | ICD-10-CM

## 2020-12-23 DIAGNOSIS — C3411 Malignant neoplasm of upper lobe, right bronchus or lung: Secondary | ICD-10-CM | POA: Diagnosis not present

## 2020-12-23 DIAGNOSIS — G893 Neoplasm related pain (acute) (chronic): Secondary | ICD-10-CM

## 2020-12-23 LAB — CBC WITH DIFFERENTIAL (CANCER CENTER ONLY)
Abs Immature Granulocytes: 0.03 10*3/uL (ref 0.00–0.07)
Basophils Absolute: 0.1 10*3/uL (ref 0.0–0.1)
Basophils Relative: 1 %
Eosinophils Absolute: 0.3 10*3/uL (ref 0.0–0.5)
Eosinophils Relative: 3 %
HCT: 34.2 % — ABNORMAL LOW (ref 39.0–52.0)
Hemoglobin: 10.7 g/dL — ABNORMAL LOW (ref 13.0–17.0)
Immature Granulocytes: 0 %
Lymphocytes Relative: 11 %
Lymphs Abs: 1 10*3/uL (ref 0.7–4.0)
MCH: 27.8 pg (ref 26.0–34.0)
MCHC: 31.3 g/dL (ref 30.0–36.0)
MCV: 88.8 fL (ref 80.0–100.0)
Monocytes Absolute: 0.6 10*3/uL (ref 0.1–1.0)
Monocytes Relative: 6 %
Neutro Abs: 7.8 10*3/uL — ABNORMAL HIGH (ref 1.7–7.7)
Neutrophils Relative %: 79 %
Platelet Count: 283 10*3/uL (ref 150–400)
RBC: 3.85 MIL/uL — ABNORMAL LOW (ref 4.22–5.81)
RDW: 15.2 % (ref 11.5–15.5)
WBC Count: 9.8 10*3/uL (ref 4.0–10.5)
nRBC: 0 % (ref 0.0–0.2)

## 2020-12-23 LAB — CMP (CANCER CENTER ONLY)
ALT: 13 U/L (ref 0–44)
AST: 7 U/L — ABNORMAL LOW (ref 15–41)
Albumin: 2.6 g/dL — ABNORMAL LOW (ref 3.5–5.0)
Alkaline Phosphatase: 78 U/L (ref 38–126)
Anion gap: 10 (ref 5–15)
BUN: 36 mg/dL — ABNORMAL HIGH (ref 8–23)
CO2: 27 mmol/L (ref 22–32)
Calcium: 9.1 mg/dL (ref 8.9–10.3)
Chloride: 102 mmol/L (ref 98–111)
Creatinine: 1.84 mg/dL — ABNORMAL HIGH (ref 0.61–1.24)
GFR, Estimated: 35 mL/min — ABNORMAL LOW (ref >60)
Glucose, Bld: 108 mg/dL — ABNORMAL HIGH (ref 70–99)
Potassium: 4.8 mmol/L (ref 3.5–5.1)
Sodium: 139 mmol/L (ref 135–145)
Total Bilirubin: 0.4 mg/dL (ref 0.3–1.2)
Total Protein: 7.6 g/dL (ref 6.5–8.1)

## 2020-12-23 MED ORDER — PROCHLORPERAZINE MALEATE 10 MG PO TABS: 10.0000 mg | ORAL_TABLET | Freq: Four times a day (QID) | ORAL | 0 refills | Status: DC | PRN

## 2020-12-23 MED ORDER — OXYCODONE-ACETAMINOPHEN 5-325 MG PO TABS: 1.0000 | ORAL_TABLET | Freq: Three times a day (TID) | ORAL | 0 refills | Status: DC | PRN

## 2020-12-23 NOTE — Progress Notes (Signed)
[place] Telephone:([phone]   [phone]  CONSULT NOTE  REFERRING PHYSICIAN: Dr. Christinia Gully  REASON FOR CONSULTATION:  87 years old African-American male recently diagnosed with lung cancer.  HPI [name] is a 87 y.o. male with long history of smoking and past medical history significant for osteoarthritis, hypertension and hypothyroidism.  The patient was seen by his primary care physician in early January 2022 complaining of weight loss and right-sided chest pain. He had chest x-ray performed on October 21, 2020 and it showed 5.3 x 3.3 cm masslike opacity in the right upper lobe suspicious for malignancy.  This was followed by CT scan of the chest on October 23, 2020 and it showed 5.5 x 7.5 x 5.4 cm invasive right upper lobe chest wall invasive mass with destruction of the right lateral third rib and questionable erosion of the right second rib.  The appearance is most compatible with lung cancer with chest wall invasion.  There was scattered small but symmetric right axillary and subpectoral lymph node that were nonspecific.  There was also nonspecific 3.1 cm hypodense lesion of the upper spleen likely benign such as hemangioma, lymphoma or metastatic disease.  The patient was referred to Dr. Melvyn Novas and on November 25, 2020 he had a PET scan performed and it showed 8.0 cm hypermetabolic mass involving the lateral right upper lobe and the right chest wall soft tissue most likely representing primary bronchogenic carcinoma with chest wall invasion.  There was mild hypermetabolic lymphadenopathy in the right hilum, mediastinum and right subpectoral and supraclavicular regions consistent with metastatic disease.  There was no evidence of metastatic disease within the abdomen or pelvis.  On December 12, 2020 the patient underwent CT-guided core biopsy of the right chest mass by interventional radiology. The final pathology [id number]) was consistent with  non-small cell carcinoma. The tumor is poorly differentiated and is positive for TTF-1, cytokeratin 5/6, and p40. The cells are negative for NapsinA. 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  The patient was referred to me today for evaluation and recommendation regarding treatment of his condition. When seen today he continues to complain of pain on the right shoulder and right chest wall with a lump in the right axilla.  He denied having any shortness of breath, cough or hemoptysis.  He lost around 15 pounds in the last 3 months.  He denied having any nausea, vomiting, diarrhea or constipation.  He denied having any headache or visual changes. Family history significant for mother who died from cancer of unknown type.  Father died from old age. The patient is a widow and has 3 children.  He was accompanied by his son-in-law Ernst Spell.  He is currently retired and used to work part-time in a [employer].  He has a history of smoking for around 40 years and quit 30 years ago.  He drinks alcohol on the weekend and no history of drug abuse.  HPI  Past Medical History:  Diagnosis Date  . Arthritis   . Hypertension   . Hyperthyroidism     Past Surgical History:  Procedure Laterality Date  . COLONOSCOPY N/A 09/02/2016   Procedure: COLONOSCOPY;  Surgeon: Rogene Houston, MD;  Location: [facility];  Service: Endoscopy;  Laterality: N/A;  830  . NO PAST SURGERIES    . POLYPECTOMY  09/02/2016   Procedure: POLYPECTOMY;  Surgeon: Rogene Houston, MD;  Location: [facility];  Service: Endoscopy;;  Family History  Problem Relation Age of Onset  . Colon cancer Other     Social History Social History   Tobacco Use  . Smoking status: Former Smoker    Packs/day: 1.50    Years: 40.00    Pack years: 60.00    Types: Cigarettes    Quit date: 2000    Years since quitting: 22.2  . Smokeless tobacco: Never Used  Substance Use Topics  . Alcohol use: No  . Drug use:  No    No Known Allergies  Current Outpatient Medications  Medication Sig Dispense Refill  . labetalol (NORMODYNE) 200 MG tablet Take 200 mg by mouth 2 (two) times daily.    Marland Kitchen levothyroxine (SYNTHROID) 150 MCG tablet Take 150 mcg by mouth daily before breakfast.    . naproxen (NAPROSYN) 500 MG tablet Take 500 mg by mouth 2 (two) times daily as needed for moderate pain.    . Olmesartan-Amlodipine-HCTZ 40-10-12.5 MG TABS Take 1 tablet by mouth daily.    . tadalafil (CIALIS) 5 MG tablet Take 5 mg by mouth daily.    . tamsulosin (FLOMAX) 0.4 MG CAPS capsule Take 0.4 mg by mouth daily.     No current facility-administered medications for this visit.    Review of Systems  Constitutional: positive for anorexia, fatigue and weight loss Eyes: negative Ears, nose, mouth, throat, and face: negative Respiratory: positive for pleurisy/chest pain Cardiovascular: negative Gastrointestinal: negative Genitourinary:negative Integument/breast: negative Hematologic/lymphatic: negative Musculoskeletal:negative Neurological: negative Behavioral/Psych: negative Endocrine: negative Allergic/Immunologic: negative  Physical Exam  JAS:NKNLZ, healthy, no distress, well nourished and well developed SKIN: skin color, texture, turgor are normal, no rashes or significant lesions HEAD: Normocephalic, No masses, lesions, tenderness or abnormalities EYES: normal, PERRLA, Conjunctiva are pink and non-injected EARS: External ears normal, Canals clear OROPHARYNX:no exudate, no erythema and lips, buccal mucosa, and tongue normal  NECK: supple, no adenopathy, no JVD LYMPH:  no palpable lymphadenopathy, no hepatosplenomegaly LUNGS: clear to auscultation , and palpation HEART: regular rate & rhythm, no murmurs and no gallops ABDOMEN:abdomen soft, non-tender, normal bowel sounds and no masses or organomegaly BACK: Back symmetric, no curvature., No CVA tenderness EXTREMITIES:no joint deformities, effusion, or  inflammation, no edema  NEURO: alert & oriented x 3 with fluent speech, no focal motor/sensory deficits  PERFORMANCE STATUS: ECOG 1  LABORATORY DATA: Lab Results  Component Value Date   WBC 9.8 12/23/2020   HGB 10.7 (L) 12/23/2020   HCT 34.2 (L) 12/23/2020   MCV 88.8 12/23/2020   PLT 283 12/23/2020      Chemistry      Component Value Date/Time   NA 137 05/19/2009 0503   K 3.9 05/19/2009 0503   CL 111 05/19/2009 0503   CO2 23 05/19/2009 0503   BUN 10 05/19/2009 0503   CREATININE 2.30 (H) 10/22/2020 0948      Component Value Date/Time   CALCIUM 8.0 (L) 05/19/2009 0503   ALKPHOS 52 05/17/2009 0410   AST 22 05/17/2009 0410   ALT 29 05/17/2009 0410   BILITOT 0.5 05/17/2009 0410       RADIOGRAPHIC STUDIES: NM PET Image Initial (PI) Skull Base To Thigh  Result Date: 11/26/2020 CLINICAL DATA:  Initial treatment strategy for right lung mass. EXAM: NUCLEAR MEDICINE PET SKULL BASE TO THIGH TECHNIQUE: 7.9 mCi F-18 FDG was injected intravenously. Full-ring PET imaging was performed from the skull base to thigh after the radiotracer. CT data was obtained and used for attenuation correction and anatomic localization. Fasting blood glucose: 80 mg/dl  COMPARISON:  Chest CT on 10/23/2020 FINDINGS: Mediastinal blood-pool activity (background): SUV max = 1.9 Liver activity (reference): SUV max = N/A NECK: Mild FDG uptake is seen within a sub-cm right supraclavicular lymph node, with SUV max of 2.6. Moderately goiter with substernal extension is seen which shows diffuse hypermetabolic activity, with SUV max of 12.4. Incidental CT findings:  None. CHEST: A large hypermetabolic mass is seen which involves the lateral right upper lobe and right chest wall soft tissues. This measures 7.8 x 6.9 cm on image 90/3, with SUV max of 38.0. A 9 mm right subpectoral lymph node is seen on image 79/3, which is hypermetabolic with SUV max of 8.3. Mild hypermetabolic right hilar lymphadenopathy is seen with SUV max of  3.8. Sub-cm hypermetabolic mediastinal lymph nodes are seen in AP window, low right paratracheal region, and paraesophageal space the level of the carina. SUV max of index lymph nodes in the AP window measures 3.6. Incidental CT findings: Moderate to severe paraseptal emphysema. No evidence of pleural effusion. ABDOMEN/PELVIS: No abnormal hypermetabolic activity within the liver, pancreas, adrenal glands, or spleen. No hypermetabolic lymph nodes in the abdomen or pelvis. Incidental CT findings: Aortic atherosclerotic calcification noted. Mild to moderately enlarged prostate gland. Diffuse bladder wall thickening, consistent with chronic bladder outlet obstruction. SKELETON: No focal hypermetabolic bone lesions to suggest skeletal metastasis. Incidental CT findings:  None. IMPRESSION: 8 cm hypermetabolic mass involving the lateral right upper lobe and right chest wall soft tissues, most likely representing primary bronchogenic carcinoma with chest wall invasion. Mild hypermetabolic lymphadenopathy in right hilum, mediastinum, and right subpectoral and supraclavicular regions, consistent with metastatic disease. No evidence of metastatic disease within the abdomen or pelvis. Moderate multinodular goiter with diffuse hypermetabolic activity. Recommend correlation with thyroid function tests. Electronically Signed   By: Marlaine Hind M.D.   On: 11/26/2020 11:56   CT Biopsy  Result Date: 12/12/2020 INDICATION: 87 year old with a large destructive mass involving the right upper lung and right chest wall. Tissue diagnosis is needed. EXAM: CT-GUIDED CORE BIOPSY OF RIGHT CHEST MASS MEDICATIONS: None. ANESTHESIA/SEDATION: Moderate (conscious) sedation was employed during this procedure. A total of Versed 0.5 mg and Fentanyl 25 mcg was administered intravenously. Moderate Sedation Time: 10 minutes. The patient's level of consciousness and vital signs were monitored continuously by radiology nursing throughout the procedure  under my direct supervision. FLUOROSCOPY TIME:  None COMPLICATIONS: None immediate. PROCEDURE: Informed written consent was obtained from the patient after a thorough discussion of the procedural risks, benefits and alternatives. All questions were addressed. A timeout was performed prior to the initiation of the procedure. Patient was placed supine. CT images through the chest were obtained. The large mass in the right chest was identified and targeted. The overlying skin was prepped with chlorhexidine and sterile field was created. Skin was anesthetized using 1% lidocaine. Small incision was made. 17 gauge coaxial needle was directed into the mass and position was confirmed with CT. Total of 3 core biopsies were obtained with an 18 gauge core device. Specimens placed in formalin. Needle was removed without complication. Bandage placed over the puncture site. FINDINGS: Large mass in the right upper lobe that extends through the right chest wall with bony destruction of the right third rib. Severe emphysematous changes. Needle was directed along the lateral aspect of the chest wall mass. Adequate specimens obtained. IMPRESSION: CT-guided core biopsy of the right chest wall mass. Electronically Signed   By: Markus Daft M.D.   On: 12/12/2020 17:56  DG Chest Port 1 View  Result Date: 12/12/2020 CLINICAL DATA:  RIGHT lung mass, post biopsy. EXAM: PORTABLE CHEST 1 VIEW COMPARISON:  Biopsy images from December 12, 2020 and CT of the chest from October 23, 2020 FINDINGS: Trachea midline. Cardiomediastinal contours and hilar structures are unremarkable. Mass destroying RIGHT-sided ribs 2 and 3 measuring approximately 8 x 5 cm as on biopsy images and prior PET images. No visible pneumothorax. No lobar consolidation. Background pulmonary emphysema. Rib destruction as above. IMPRESSION: Chest wall mass destroying RIGHT-sided ribs along the upper chest as seen on prior images. No sign of pneumothorax. Electronically Signed    By: Zetta Bills M.D.   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   PLAN: I had a lengthy discussion with the patient and his son-in-law about his current disease stage, prognosis and treatment options. I personally and independently reviewed the scan images and discussed the result and showed the images to the patient and his son-in-law today. I recommended for the patient to complete the staging work-up by ordering MRI of the brain to rule out brain metastasis. I will also ask pathology to send his tissue for molecular studies and PD-L1 expression. I recommended for the patient a course of concurrent chemoradiation with weekly carboplatin for AUC of 2 and paclitaxel 45 mg/M2 for 6-7 weeks.  I understand the patient has stage IV lung cancer but Dr. Tammi Klippel from radiation oncology expect to include the right subpectoral lymph node in the radiation field. I discussed with the patient the adverse effect of this treatment including but not limited to alopecia, myelosuppression, nausea and vomiting, peripheral neuropathy, liver or renal dysfunction. He is expected to start the first dose of this treatment on December 30, 2020. The patient is expected to see Dr. Tammi Klippel later today for discussion of the radiotherapy option. He will have a chemotherapy education class before the first dose of treatment. I will call his pharmacy with prescription of Compazine 10 mg p.o. every 6 hours as needed for nausea in addition to Percocet 5/325 mg p.o. every 8 hours as needed for pain. The patient will come back for follow-up visit in 2 weeks for evaluation  and management of any adverse effect of his treatment. He was advised to call immediately if he has any other concerning symptoms in the interval. The patient voices understanding of current disease status and treatment options and is in agreement with the current care plan.  All questions were answered. The patient knows to call the clinic with any problems, questions or concerns. We can certainly see the patient much sooner if necessary.  Thank you so much for allowing me to participate in the care of [employer]. I will continue to follow up the patient with you and assist in his care.  The total time spent in the appointment was 90 minutes.  Disclaimer: This note was dictated with voice recognition software. Similar sounding words can inadvertently be transcribed and may not be corrected upon review.   Eilleen Kempf December 23, 2020, 2:51 PM

## 2020-12-23 NOTE — Research (Signed)
[id number] - Customer service manager for the Discovery and Validation of Biomarkers for the Prediction, Diagnosis, and Management of Disease  12/23/20  Patient [name] was identified by this Research officer, political party as a potential candidate for the above listed study.  This Clinical Research Coordinator met with [name], [id number], on 12/23/20 in a manner and location that ensures patient privacy to discuss participation in the above listed research study.  Patient is Accompanied by his son-in-law.  A copy of the informed consent document and separate HIPAA Authorization was provided to the patient.  Patient reads, speaks, and understands [place].    Patient was provided with the business card of this Coordinator and encouraged to contact the research team with any questions.  Approximately 10 minutes were spent with the patient reviewing the informed consent documents.  Patient was provided the option of taking informed consent documents home to review and was encouraged to review at their convenience with their support network, including other care providers. Patient took the consent documents home to review.    Clabe Seal Clinical Research Coordinator I  12/23/20 3:39 PM

## 2020-12-23 NOTE — Progress Notes (Signed)
START ON PATHWAY REGIMEN - Non-Small Cell Lung     Administer weekly:     Paclitaxel      Carboplatin   **Always confirm dose/schedule in your pharmacy ordering system**  Patient Characteristics: Preoperative or Nonsurgical Candidate (Clinical Staging), Stage III - Nonsurgical Candidate (Nonsquamous and Squamous), PS = 0, 1 Therapeutic Status: Preoperative or Nonsurgical Candidate (Clinical Staging) AJCC T Category: cT4 AJCC N Category: cN3 AJCC M Category: cM0 AJCC 8 Stage Grouping: IIIC ECOG Performance Status: 1 Intent of Therapy: Non-Curative / Palliative Intent, Discussed with Patient

## 2020-12-23 NOTE — Progress Notes (Signed)
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 Primary cancer of right upper lobe of lung ([place])  C34.11     HISTORY OF PRESENT ILLNESS::Kaydon E Lagos is a 87 y.o. male who underwent CT scan when he noticed pain under the right arm.  He also complained of a 10 pound weight loss.  The CT scan done on October 23, 2020 revealed a 7.5 x 5.5 right upper lobe chest wall invasive mass with destruction of the right lateral third rib and questionable erosion of the right second rib.  Appearance is most compatible with lung cancer with chest wall invasion.    PET scan done on November 26, 2020 showed intensely hypermetabolic 8 cm mass involving the right upper lobe and chest wall.       CT-Biopsy was performed on 12/12/20:    Pathology showed non-small cell carcinoma.  The tumor is poorly differentiated and is positive for TTF-1, cytokeratin 5/6, and p40. The cells are negative for NapsinA. The immunoprofile is consistent with an adenosquamous lung carcinoma.  The patient was referred today after presentation in the multidisciplinary conference last week.  Radiology studies and pathology slides were presented there for review and discussion of treatment options.  A consensus was discussed regarding potential next steps.  PREVIOUS RADIATION THERAPY: No  PAST MEDICAL HISTORY:  has a past medical history of Arthritis, Hypertension, and Hyperthyroidism.    PAST SURGICAL HISTORY: Past Surgical History:  Procedure Laterality Date  . COLONOSCOPY N/A 09/02/2016   Procedure: COLONOSCOPY;   Surgeon: Rogene Houston, MD;  Location: [facility];  Service: Endoscopy;  Laterality: N/A;  830  . NO PAST SURGERIES    . POLYPECTOMY  09/02/2016   Procedure: POLYPECTOMY;  Surgeon: Rogene Houston, MD;  Location: [facility];  Service: Endoscopy;;    FAMILY HISTORY: family history includes Colon cancer in an other family member.  SOCIAL HISTORY:  reports that he quit smoking about 22 years ago. His smoking use included cigarettes. He has a 60.00 pack-year smoking history. He has never used smokeless tobacco. He reports that he does not drink alcohol and does not use drugs.  ALLERGIES: Patient has no known allergies.  MEDICATIONS:  Current Outpatient Medications  Medication Sig Dispense Refill  . labetalol (NORMODYNE) 200 MG tablet Take 200 mg by mouth 2 (two) times daily.    Marland Kitchen levothyroxine (SYNTHROID) 150 MCG tablet Take 150 mcg by mouth daily before breakfast.    . naproxen (NAPROSYN) 500 MG tablet Take 500 mg by mouth 2 (two) times daily as needed for moderate pain.    . Olmesartan-Amlodipine-HCTZ 40-10-12.5 MG TABS Take 1 tablet by mouth daily.    . tadalafil (CIALIS) 5 MG tablet Take 5 mg by mouth daily.    . tamsulosin (FLOMAX) 0.4 MG CAPS capsule Take 0.4 mg by mouth daily.     No current facility-administered medications for this visit.    REVIEW OF SYSTEMS:  A 15 point review of systems is documented in the electronic medical record. This was obtained by the nursing staff. However, I reviewed this with the  patient to discuss relevant findings and make appropriate changes.  Pertinent items are noted in HPI.   PHYSICAL EXAM:   Per Med-Onc: CXK:GYJEH, healthy, no distress, well nourished and well developed SKIN: skin color, texture, turgor are normal, no rashes or significant lesions HEAD: Normocephalic, No masses, lesions, tenderness or abnormalities EYES: normal, PERRLA, Conjunctiva are pink and non-injected EARS: External ears normal, Canals clear OROPHARYNX:no  exudate, no erythema and lips, buccal mucosa, and tongue normal  NECK: supple, no adenopathy, no JVD LYMPH:  no palpable lymphadenopathy, no hepatosplenomegaly LUNGS: clear to auscultation , and palpation HEART: regular rate & rhythm, no murmurs and no gallops ABDOMEN:abdomen soft, non-tender, normal bowel sounds and no masses or organomegaly BACK: Back symmetric, no curvature., No CVA tenderness EXTREMITIES:no joint deformities, effusion, or inflammation, no edema  NEURO: alert & oriented x 3 with fluent speech, no focal motor/sensory deficits  KPS = 90  100 - Normal; no complaints; no evidence of disease. 90   - Able to carry on normal activity; minor signs or symptoms of disease. 80   - Normal activity with effort; some signs or symptoms of disease. 1   - Cares for self; unable to carry on normal activity or to do active work. 60   - Requires occasional assistance, but is able to care for most of his personal needs. 50   - Requires considerable assistance and frequent medical care. 37   - Disabled; requires special care and assistance. 87   - Severely disabled; hospital admission is indicated although death not imminent. 23   - Very sick; hospital admission necessary; active supportive treatment necessary. 10   - Moribund; fatal processes progressing rapidly. 0     - Dead  Karnofsky DA, [facility], Craver LS and [facility] [phone]) The use of the nitrogen mustards in the palliative treatment of carcinoma: with particular reference to bronchogenic carcinoma Cancer 1 [id number]  LABORATORY DATA:  Lab Results  Component Value Date   WBC 8.4 12/12/2020   HGB 10.5 (L) 12/12/2020   HCT 33.3 (L) 12/12/2020   MCV 88.3 12/12/2020   PLT 351 12/12/2020   Lab Results  Component Value Date   NA 137 05/19/2009   K 3.9 05/19/2009   CL 111 05/19/2009   CO2 23 05/19/2009   Lab Results  Component Value Date   ALT 29 05/17/2009   AST 22 05/17/2009   ALKPHOS 52 05/17/2009   BILITOT 0.5  05/17/2009    PULMONARY FUNCTION TEST:   Recent Review Flowsheet Data   There is no flowsheet data to display.     RADIOGRAPHY: NM PET Image Initial (PI) Skull Base To Thigh  Result Date: 11/26/2020 CLINICAL DATA:  Initial treatment strategy for right lung mass. EXAM: NUCLEAR MEDICINE PET SKULL BASE TO THIGH TECHNIQUE: 7.9 mCi F-18 FDG was injected intravenously. Full-ring PET imaging was performed from the skull base to thigh after the radiotracer. CT data was obtained and used for attenuation correction and anatomic localization. Fasting blood glucose: 80 mg/dl COMPARISON:  Chest CT on 10/23/2020 FINDINGS: Mediastinal blood-pool activity (background): SUV max = 1.9 Liver activity (reference): SUV max = N/A NECK: Mild FDG uptake is seen within a sub-cm right supraclavicular lymph node, with SUV max of 2.6. Moderately goiter with substernal extension is seen which shows diffuse hypermetabolic activity, with SUV max of 12.4. Incidental CT findings:  None. CHEST: A large hypermetabolic mass is seen which involves the lateral right upper lobe and right chest wall soft tissues. This  measures 7.8 x 6.9 cm on image 90/3, with SUV max of 38.0. A 9 mm right subpectoral lymph node is seen on image 79/3, which is hypermetabolic with SUV max of 8.3. Mild hypermetabolic right hilar lymphadenopathy is seen with SUV max of 3.8. Sub-cm hypermetabolic mediastinal lymph nodes are seen in AP window, low right paratracheal region, and paraesophageal space the level of the carina. SUV max of index lymph nodes in the AP window measures 3.6. Incidental CT findings: Moderate to severe paraseptal emphysema. No evidence of pleural effusion. ABDOMEN/PELVIS: No abnormal hypermetabolic activity within the liver, pancreas, adrenal glands, or spleen. No hypermetabolic lymph nodes in the abdomen or pelvis. Incidental CT findings: Aortic atherosclerotic calcification noted. Mild to moderately enlarged prostate gland. Diffuse bladder wall  thickening, consistent with chronic bladder outlet obstruction. SKELETON: No focal hypermetabolic bone lesions to suggest skeletal metastasis. Incidental CT findings:  None. IMPRESSION: 8 cm hypermetabolic mass involving the lateral right upper lobe and right chest wall soft tissues, most likely representing primary bronchogenic carcinoma with chest wall invasion. Mild hypermetabolic lymphadenopathy in right hilum, mediastinum, and right subpectoral and supraclavicular regions, consistent with metastatic disease. No evidence of metastatic disease within the abdomen or pelvis. Moderate multinodular goiter with diffuse hypermetabolic activity. Recommend correlation with thyroid function tests. Electronically Signed   By: Marlaine Hind M.D.   On: 11/26/2020 11:56   CT Biopsy  Result Date: 12/12/2020 INDICATION: 87 year old with a large destructive mass involving the right upper lung and right chest wall. Tissue diagnosis is needed. EXAM: CT-GUIDED CORE BIOPSY OF RIGHT CHEST MASS MEDICATIONS: None. ANESTHESIA/SEDATION: Moderate (conscious) sedation was employed during this procedure. A total of Versed 0.5 mg and Fentanyl 25 mcg was administered intravenously. Moderate Sedation Time: 10 minutes. The patient's level of consciousness and vital signs were monitored continuously by radiology nursing throughout the procedure under my direct supervision. FLUOROSCOPY TIME:  None COMPLICATIONS: None immediate. PROCEDURE: Informed written consent was obtained from the patient after a thorough discussion of the procedural risks, benefits and alternatives. All questions were addressed. A timeout was performed prior to the initiation of the procedure. Patient was placed supine. CT images through the chest were obtained. The large mass in the right chest was identified and targeted. The overlying skin was prepped with chlorhexidine and sterile field was created. Skin was anesthetized using 1% lidocaine. Small incision was made. 17  gauge coaxial needle was directed into the mass and position was confirmed with CT. Total of 3 core biopsies were obtained with an 18 gauge core device. Specimens placed in formalin. Needle was removed without complication. Bandage placed over the puncture site. FINDINGS: Large mass in the right upper lobe that extends through the right chest wall with bony destruction of the right third rib. Severe emphysematous changes. Needle was directed along the lateral aspect of the chest wall mass. Adequate specimens obtained. IMPRESSION: CT-guided core biopsy of the right chest wall mass. Electronically Signed   By: Markus Daft M.D.   On: 12/12/2020 17:56   DG Chest Port 1 View  Result Date: 12/12/2020 CLINICAL DATA:  RIGHT lung mass, post biopsy. EXAM: PORTABLE CHEST 1 VIEW COMPARISON:  Biopsy images from December 12, 2020 and CT of the chest from October 23, 2020 FINDINGS: Trachea midline. Cardiomediastinal contours and hilar structures are unremarkable. Mass destroying RIGHT-sided ribs 2 and 3 measuring approximately 8 x 5 cm as on biopsy images and prior PET images. No visible pneumothorax. No lobar consolidation. Background pulmonary emphysema. Rib destruction as above.  IMPRESSION: Chest wall mass destroying RIGHT-sided ribs along the upper chest as seen on prior images. No sign of pneumothorax. Electronically Signed   By: Zetta Bills M.D.   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  His isolated metastatic deposit is represented by a single subpectoral node regional to the chest wall invasion potentially representing lymphatic spread rather than hematogenous metastasis.  As such, his disease could potentially be characterized as locally advanced.  The patient may benefit from local thoracic radiotherapy with concurrent chemotherapy.  PLAN:Today, I talked to the patient and family about the findings and work-up thus far.   We discussed the natural history of locally advanced non-small cell lung cancer and general treatment, highlighting the role of radiotherapy in the management.  We discussed the available radiation techniques, and focused on the details of logistics and delivery.  We reviewed the anticipated acute and late sequelae associated with radiation in this setting.  The patient was encouraged to ask questions that I answered to the best of my ability.    The patient would like to proceed with radiation and will be scheduled for CT simulation on Friday 12/27/20 at 9:30 am, with plan to start radiation on Monday 12/30/20.  I spent 60 minutes total in this encounter.   ------------------------------------------------  Sheral Apley Tammi Klippel, M.D.

## 2020-12-25 ENCOUNTER — Telehealth: Payer: Self-pay | Admitting: Emergency Medicine

## 2020-12-25 ENCOUNTER — Telehealth: Payer: Self-pay | Admitting: *Deleted

## 2020-12-25 NOTE — Telephone Encounter (Signed)
I followed up on [name] schedule. His MRI brain has not been scheduled yet but is authorized. I called central scheduling and was given an appt for scan. I called [name] daughter with an update. She verbalized understanding of appt

## 2020-12-25 NOTE — Telephone Encounter (Signed)
[facility] - Customer service manager for the Discovery and Validation of Biomarkers for the Prediction, Diagnosis, and Management of Disease  12/25/20  Called patient to follow up concerning the [facility] research study.  Patient states he is still interested.  Scheduled appointment to review consent on Friday, 12/27/20, after his chemo education and CT simulation appointments.  Clabe Seal Clinical Research Coordinator I  12/25/20  2:25 PM

## 2020-12-25 NOTE — Telephone Encounter (Signed)
Called patient to ask if could be here on 12-27-20 @ 10:30 am instead of 9:30 am, lvm for a return call

## 2020-12-26 ENCOUNTER — Encounter: Payer: Self-pay | Admitting: *Deleted

## 2020-12-26 ENCOUNTER — Telehealth: Payer: Self-pay | Admitting: *Deleted

## 2020-12-27 ENCOUNTER — Other Ambulatory Visit: Payer: Self-pay

## 2020-12-27 ENCOUNTER — Other Ambulatory Visit: Payer: Self-pay | Admitting: Emergency Medicine

## 2020-12-27 ENCOUNTER — Inpatient Hospital Stay: Payer: Medicare HMO | Attending: Internal Medicine

## 2020-12-27 ENCOUNTER — Encounter: Payer: Medicare HMO | Admitting: Emergency Medicine

## 2020-12-27 ENCOUNTER — Encounter: Payer: Self-pay | Admitting: Emergency Medicine

## 2020-12-27 ENCOUNTER — Ambulatory Visit
Admission: RE | Admit: 2020-12-27 | Discharge: 2020-12-27 | Disposition: A | Discharge status: Home / Self Care | Payer: Medicare HMO | Source: Ambulatory Visit | Attending: Radiation Oncology | Admitting: Radiation Oncology

## 2020-12-27 DIAGNOSIS — C3411 Malignant neoplasm of upper lobe, right bronchus or lung: Secondary | ICD-10-CM | POA: Insufficient documentation

## 2020-12-27 DIAGNOSIS — C778 Secondary and unspecified malignant neoplasm of lymph nodes of multiple regions: Secondary | ICD-10-CM | POA: Insufficient documentation

## 2020-12-27 DIAGNOSIS — Z5111 Encounter for antineoplastic chemotherapy: Secondary | ICD-10-CM | POA: Insufficient documentation

## 2020-12-27 DIAGNOSIS — C3490 Malignant neoplasm of unspecified part of unspecified bronchus or lung: Secondary | ICD-10-CM

## 2020-12-27 DIAGNOSIS — Z510 Encounter for antineoplastic radiation therapy: Secondary | ICD-10-CM | POA: Insufficient documentation

## 2020-12-27 DIAGNOSIS — C7989 Secondary malignant neoplasm of other specified sites: Secondary | ICD-10-CM | POA: Insufficient documentation

## 2020-12-27 NOTE — Progress Notes (Signed)
  Radiation Oncology         (336) [phone] ________________________________  Name: [name] MRN: [id number]  Date: 12/27/2020  DOB: September 16, 1933  SIMULATION AND TREATMENT PLANNING NOTE    ICD-10-CM   1. 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  Identity was confirmed.  All relevant records and images related to the planned course of therapy were reviewed.  The patient freely provided informed written consent to proceed with treatment after reviewing the details related to the planned course of therapy. The consent form was witnessed and verified by the simulation staff.  Then, the patient was set-up in a stable reproducible  supine position for radiation therapy.  CT images were obtained.  Surface markings were placed.  The CT images were loaded into the planning software.  Then the target and avoidance structures were contoured.  Treatment planning then occurred.  The radiation prescription was entered and confirmed.  Then, I designed and supervised the construction of a total of 6 medically necessary complex treatment devices, including a BodyFix immobilization mold custom fitted to the patient along with 5 multileaf collimators conformally shaped radiation around the treatment target while shielding critical structures such as the heart and spinal cord maximally.  I have requested : 3D Simulation  I have requested a DVH of the following structures: Left lung, right lung, spinal cord, heart, esophagus, and target.  I have ordered:Nutrition Consult  SPECIAL TREATMENT PROCEDURE:  The planned course of therapy using radiation constitutes a special treatment procedure. Special care is required in the management of this patient for the following reasons.  The patient will be receiving concurrent  chemotherapy requiring careful monitoring for increased toxicities of treatment including periodic laboratory values.  The special nature of the planned course of radiotherapy will require increased physician supervision and oversight to ensure patient's safety with optimal treatment outcomes.  PLAN:  The patient will receive 66 Gy in 33 fractions.  ________________________________  Sheral Apley Tammi Klippel, M.D.

## 2020-12-27 NOTE — Research (Signed)
Encounter opened in error

## 2020-12-27 NOTE — Research (Signed)
[facility] - Customer service manager for the Discovery and Validation of Biomarkers for the Prediction, Diagnosis, and Management of Disease  12/27/20  Consent: Patient [name] was identified by this Research officer, political party as a potential candidate for the above listed study.  This Clinical Research Coordinator met with JAGGAR BENKO, MRN [id number] on 12/27/20 in a manner and location that ensures patient privacy to discuss participation in the above listed research study.  Patient is Accompanied by his Daughter.  Patient was previously provided with informed consent documents.  As outlined in the informed consent form, this Coordinator and WINSON EICHORN discussed the purpose of the research study, the investigational nature of the study, study procedures and requirements for study participation, potential risks and benefits of study participation, as well as alternatives to participation.  The patient understands participation is voluntary and they may withdraw from study participation at any time.  Patient understands enrollment is pending full eligibility review.   Confidentiality and how the patient's information will be used as part of study participation were discussed.  Patient was informed there is reimbursement provided for their time and effort spent on trial participation.  The patient is encouraged to discuss research study participation with their insurance provider to determine what costs they may incur as part of study participation, including research related injury.    All questions were answered to patient's satisfaction.  The informed consent and separate HIPAA Authorization was reviewed page by page.  The patient's mental and emotional status is appropriate to provide informed consent, and the patient verbalizes an understanding of study participation.  Patient has agreed to participate in the above listed research study and has voluntarily signed the  informed consent version 2 and separate HIPAA Authorization, version 5 on 12/27/20 at 1145 AM.  The patient was provided with a copy of the signed informed consent form and separate HIPAA Authorization for their reference.  No study specific procedures were obtained prior to the signing of the informed consent document.  Approximately 20 minutes were spent with the patient reviewing the informed consent documents.  Eligibility:  This Coordinator has reviewed this patient's inclusion and exclusion criteria and confirmed [place] is eligible for study participation.  Patient will continue with enrollment.  Plan:  Plan to collect research blood specimen on Monday, 12/30/20 at already scheduled lab appointment.   Clabe Seal Clinical Research Coordinator I  12/27/20 12:26 PM

## 2020-12-30 ENCOUNTER — Inpatient Hospital Stay: Payer: Medicare HMO

## 2020-12-30 ENCOUNTER — Other Ambulatory Visit: Payer: Self-pay | Admitting: Medical Oncology

## 2020-12-30 ENCOUNTER — Ambulatory Visit
Admission: RE | Admit: 2020-12-30 | Discharge: 2020-12-30 | Disposition: A | Discharge status: Home / Self Care | Payer: Medicare HMO | Source: Ambulatory Visit | Attending: Radiation Oncology | Admitting: Radiation Oncology

## 2020-12-30 ENCOUNTER — Other Ambulatory Visit: Payer: Self-pay

## 2020-12-30 ENCOUNTER — Encounter: Payer: Self-pay | Admitting: Emergency Medicine

## 2020-12-30 VITALS — BP 159/68 | HR 70 | Temp 98.5°F | Resp 18

## 2020-12-30 DIAGNOSIS — C3490 Malignant neoplasm of unspecified part of unspecified bronchus or lung: Secondary | ICD-10-CM

## 2020-12-30 DIAGNOSIS — C7989 Secondary malignant neoplasm of other specified sites: Secondary | ICD-10-CM | POA: Diagnosis not present

## 2020-12-30 DIAGNOSIS — C3411 Malignant neoplasm of upper lobe, right bronchus or lung: Secondary | ICD-10-CM

## 2020-12-30 DIAGNOSIS — Z5111 Encounter for antineoplastic chemotherapy: Secondary | ICD-10-CM | POA: Diagnosis not present

## 2020-12-30 DIAGNOSIS — Z510 Encounter for antineoplastic radiation therapy: Secondary | ICD-10-CM | POA: Diagnosis not present

## 2020-12-30 DIAGNOSIS — C778 Secondary and unspecified malignant neoplasm of lymph nodes of multiple regions: Secondary | ICD-10-CM | POA: Diagnosis not present

## 2020-12-30 LAB — CMP (CANCER CENTER ONLY)
ALT: 10 U/L (ref 0–44)
AST: 7 U/L — ABNORMAL LOW (ref 15–41)
Albumin: 2.4 g/dL — ABNORMAL LOW (ref 3.5–5.0)
Alkaline Phosphatase: 70 U/L (ref 38–126)
Anion gap: 11 (ref 5–15)
BUN: 36 mg/dL — ABNORMAL HIGH (ref 8–23)
CO2: 24 mmol/L (ref 22–32)
Calcium: 8.7 mg/dL — ABNORMAL LOW (ref 8.9–10.3)
Chloride: 103 mmol/L (ref 98–111)
Creatinine: 2.01 mg/dL — ABNORMAL HIGH (ref 0.61–1.24)
GFR, Estimated: 32 mL/min — ABNORMAL LOW (ref >60)
Glucose, Bld: 109 mg/dL — ABNORMAL HIGH (ref 70–99)
Potassium: 4.5 mmol/L (ref 3.5–5.1)
Sodium: 138 mmol/L (ref 135–145)
Total Bilirubin: 0.3 mg/dL (ref 0.3–1.2)
Total Protein: 7 g/dL (ref 6.5–8.1)

## 2020-12-30 LAB — CBC WITH DIFFERENTIAL (CANCER CENTER ONLY)
Abs Immature Granulocytes: 0.04 10*3/uL (ref 0.00–0.07)
Basophils Absolute: 0 10*3/uL (ref 0.0–0.1)
Basophils Relative: 0 %
Eosinophils Absolute: 0.3 10*3/uL (ref 0.0–0.5)
Eosinophils Relative: 3 %
HCT: 30.7 % — ABNORMAL LOW (ref 39.0–52.0)
Hemoglobin: 9.7 g/dL — ABNORMAL LOW (ref 13.0–17.0)
Immature Granulocytes: 0 %
Lymphocytes Relative: 9 %
Lymphs Abs: 1 10*3/uL (ref 0.7–4.0)
MCH: 27.6 pg (ref 26.0–34.0)
MCHC: 31.6 g/dL (ref 30.0–36.0)
MCV: 87.2 fL (ref 80.0–100.0)
Monocytes Absolute: 0.7 10*3/uL (ref 0.1–1.0)
Monocytes Relative: 6 %
Neutro Abs: 8.9 10*3/uL — ABNORMAL HIGH (ref 1.7–7.7)
Neutrophils Relative %: 82 %
Platelet Count: 291 10*3/uL (ref 150–400)
RBC: 3.52 MIL/uL — ABNORMAL LOW (ref 4.22–5.81)
RDW: 15 % (ref 11.5–15.5)
WBC Count: 10.9 10*3/uL — ABNORMAL HIGH (ref 4.0–10.5)
nRBC: 0 % (ref 0.0–0.2)

## 2020-12-30 MED ORDER — SODIUM CHLORIDE 0.9 % IV SOLN
105.6000 mg | Freq: Once | INTRAVENOUS | Status: AC
  Administered 2020-12-30: 110 mg via INTRAVENOUS
  Filled 2020-12-30: qty 11

## 2020-12-30 MED ORDER — SODIUM CHLORIDE 0.9 % IV SOLN
45.0000 mg/m2 | Freq: Once | INTRAVENOUS | Status: AC
  Administered 2020-12-30: 84 mg via INTRAVENOUS
  Filled 2020-12-30: qty 14

## 2020-12-30 MED ORDER — PALONOSETRON HCL INJECTION 0.25 MG/5ML
INTRAVENOUS | Status: AC
  Filled 2020-12-30: qty 5

## 2020-12-30 MED ORDER — DIPHENHYDRAMINE HCL 50 MG/ML IJ SOLN
INTRAMUSCULAR | Status: AC
  Filled 2020-12-30: qty 1

## 2020-12-30 MED ORDER — FAMOTIDINE IN NACL 20-0.9 MG/50ML-% IV SOLN
INTRAVENOUS | Status: AC
  Filled 2020-12-30: qty 50

## 2020-12-30 MED ORDER — SODIUM CHLORIDE 0.9 % IV SOLN
Freq: Once | INTRAVENOUS | Status: AC
  Filled 2020-12-30: qty 250

## 2020-12-30 MED ORDER — PALONOSETRON HCL INJECTION 0.25 MG/5ML
0.2500 mg | Freq: Once | INTRAVENOUS | Status: AC
  Administered 2020-12-30: 0.25 mg via INTRAVENOUS

## 2020-12-30 MED ORDER — FAMOTIDINE IN NACL 20-0.9 MG/50ML-% IV SOLN
20.0000 mg | Freq: Once | INTRAVENOUS | Status: AC
  Administered 2020-12-30: 20 mg via INTRAVENOUS

## 2020-12-30 MED ORDER — SODIUM CHLORIDE 0.9 % IV SOLN
20.0000 mg | Freq: Once | INTRAVENOUS | Status: AC
  Administered 2020-12-30: 20 mg via INTRAVENOUS
  Filled 2020-12-30: qty 20

## 2020-12-30 MED ORDER — DIPHENHYDRAMINE HCL 50 MG/ML IJ SOLN
50.0000 mg | Freq: Once | INTRAMUSCULAR | Status: AC
  Administered 2020-12-30: 50 mg via INTRAVENOUS

## 2020-12-30 NOTE — Research (Signed)
[facility] - Customer service manager for the Discovery and Validation of Biomarkers for the Prediction, Diagnosis, and Management of Disease  12/30/20  Blood collection: The patient presented to the clinic for his lab and infusion appointment.  Research blood specimen was collected at 0831.  Gift Card: Patient was given $50 visa gift card for participation in the study.  Patient was thanked for his time and participation in the study.  Clabe Seal Clinical Research Coordinator I  12/30/20 9:04 AM

## 2020-12-30 NOTE — Progress Notes (Signed)
Per Dr. Julien Nordmann - okay to proceed with treatment with elevated serum creatinine levels.

## 2020-12-30 NOTE — Research (Signed)
[facility] - Customer service manager for the Discovery and Validation of Biomarkers for the Prediction, Diagnosis, and Management of Disease This Nurse has reviewed this patient's inclusion and exclusion criteria and confirmed [name] is eligible for study participation.  Patient will continue with enrollment. Foye Spurling, BSN, [employer]in [employer]

## 2020-12-30 NOTE — Patient Instructions (Addendum)
[facility] Discharge Instructions for Patients Receiving Chemotherapy  Today you received the following chemotherapy agents: Paclitaxel (Taxol) and Carboplatin  To help prevent nausea and vomiting after your treatment, we encourage you to take your nausea medication  as prescribed.    If you develop nausea and vomiting that is not controlled by your nausea medication, call the clinic.   BELOW ARE SYMPTOMS THAT SHOULD BE REPORTED IMMEDIATELY:  *FEVER GREATER THAN 100.5 F  *CHILLS WITH OR WITHOUT FEVER  NAUSEA AND VOMITING THAT IS NOT CONTROLLED WITH YOUR NAUSEA MEDICATION  *UNUSUAL SHORTNESS OF BREATH  *UNUSUAL BRUISING OR BLEEDING  TENDERNESS IN MOUTH AND THROAT WITH OR WITHOUT PRESENCE OF ULCERS  *URINARY PROBLEMS  *BOWEL PROBLEMS  UNUSUAL RASH Items with * indicate a potential emergency and should be followed up as soon as possible.  Feel free to call the clinic should you have any questions or concerns. The clinic phone number is [phone].  Please show the [facility] at check-in to the Emergency Department and triage nurse.  Paclitaxel injection What is this medicine? PACLITAXEL (PAK li TAX el) is a chemotherapy drug. It targets fast dividing cells, like cancer cells, and causes these cells to die. This medicine is used to treat ovarian cancer, breast cancer, lung cancer, Kaposi's sarcoma, and other cancers. This medicine may be used for other purposes; ask your health care provider or pharmacist if you have questions. COMMON BRAND NAME(S): Onxol, Taxol What should I tell my health care provider before I take this medicine? They need to know if you have any of these conditions:  history of irregular heartbeat  liver disease  low blood counts, like low white cell, platelet, or red cell counts  lung or breathing disease, like asthma  tingling of the fingers or toes, or other nerve disorder  an unusual or allergic reaction to paclitaxel,  alcohol, polyoxyethylated castor oil, other chemotherapy, other medicines, foods, dyes, or preservatives  pregnant or trying to get pregnant  breast-feeding How should I use this medicine? This drug is given as an infusion into a vein. It is administered in a hospital or clinic by a specially trained health care professional. Talk to your pediatrician regarding the use of this medicine in children. Special care may be needed. Overdosage: If you think you have taken too much of this medicine contact a poison control center or emergency room at once. NOTE: This medicine is only for you. Do not share this medicine with others. What if I miss a dose? It is important not to miss your dose. Call your doctor or health care professional if you are unable to keep an appointment. What may interact with this medicine? Do not take this medicine with any of the following medications:  live virus vaccines This medicine may also interact with the following medications:  antiviral medicines for hepatitis, HIV or AIDS  certain antibiotics like erythromycin and clarithromycin  certain medicines for fungal infections like ketoconazole and itraconazole  certain medicines for seizures like carbamazepine, phenobarbital, phenytoin  gemfibrozil  nefazodone  rifampin  St. John's wort This list may not describe all possible interactions. Give your health care provider a list of all the medicines, herbs, non-prescription drugs, or dietary supplements you use. Also tell them if you smoke, drink alcohol, or use illegal drugs. Some items may interact with your medicine. What should I watch for while using this medicine? Your condition will be monitored carefully while you are receiving this medicine. You  will need important blood work done while you are taking this medicine. This medicine can cause serious allergic reactions. To reduce your risk you will need to take other medicine(s) before treatment with  this medicine. If you experience allergic reactions like skin rash, itching or hives, swelling of the face, lips, or tongue, tell your doctor or health care professional right away. In some cases, you may be given additional medicines to help with side effects. Follow all directions for their use. This drug may make you feel generally unwell. This is not uncommon, as chemotherapy can affect healthy cells as well as cancer cells. Report any side effects. Continue your course of treatment even though you feel ill unless your doctor tells you to stop. Call your doctor or health care professional for advice if you get a fever, chills or sore throat, or other symptoms of a cold or flu. Do not treat yourself. This drug decreases your body's ability to fight infections. Try to avoid being around people who are sick. This medicine may increase your risk to bruise or bleed. Call your doctor or health care professional if you notice any unusual bleeding. Be careful brushing and flossing your teeth or using a toothpick because you may get an infection or bleed more easily. If you have any dental work done, tell your dentist you are receiving this medicine. Avoid taking products that contain aspirin, acetaminophen, ibuprofen, naproxen, or ketoprofen unless instructed by your doctor. These medicines may hide a fever. Do not become pregnant while taking this medicine. Women should inform their doctor if they wish to become pregnant or think they might be pregnant. There is a potential for serious side effects to an unborn child. Talk to your health care professional or pharmacist for more information. Do not breast-feed an infant while taking this medicine. Men are advised not to father a child while receiving this medicine. This product may contain alcohol. Ask your pharmacist or healthcare provider if this medicine contains alcohol. Be sure to tell all healthcare providers you are taking this medicine. Certain  medicines, like metronidazole and disulfiram, can cause an unpleasant reaction when taken with alcohol. The reaction includes flushing, headache, nausea, vomiting, sweating, and increased thirst. The reaction can last from 30 minutes to several hours. What side effects may I notice from receiving this medicine? Side effects that you should report to your doctor or health care professional as soon as possible:  allergic reactions like skin rash, itching or hives, swelling of the face, lips, or tongue  breathing problems  changes in vision  fast, irregular heartbeat  high or low blood pressure  mouth sores  pain, tingling, numbness in the hands or feet  signs of decreased platelets or bleeding - bruising, pinpoint red spots on the skin, black, tarry stools, blood in the urine  signs of decreased red blood cells - unusually weak or tired, feeling faint or lightheaded, falls  signs of infection - fever or chills, cough, sore throat, pain or difficulty passing urine  signs and symptoms of liver injury like dark yellow or brown urine; general ill feeling or flu-like symptoms; light-colored stools; loss of appetite; nausea; right upper belly pain; unusually weak or tired; yellowing of the eyes or skin  swelling of the ankles, feet, hands  unusually slow heartbeat Side effects that usually do not require medical attention (report to your doctor or health care professional if they continue or are bothersome):  diarrhea  hair loss  loss of appetite  muscle or joint pain  nausea, vomiting  pain, redness, or irritation at site where injected  tiredness This list may not describe all possible side effects. Call your doctor for medical advice about side effects. You may report side effects to FDA at 1-800-FDA-1088. Where should I keep my medicine? This drug is given in a hospital or clinic and will not be stored at home. NOTE: This sheet is a summary. It may not cover all possible  information. If you have questions about this medicine, talk to your doctor, pharmacist, or health care provider.  2021 [employer]/Gold Standard (2019-08-16 13:37:23)  Carboplatin injection What is this medicine? CARBOPLATIN (KAR boe pla tin) is a chemotherapy drug. It targets fast dividing cells, like cancer cells, and causes these cells to die. This medicine is used to treat ovarian cancer and many other cancers. This medicine may be used for other purposes; ask your health care provider or pharmacist if you have questions. COMMON BRAND NAME(S): Paraplatin What should I tell my health care provider before I take this medicine? They need to know if you have any of these conditions:  blood disorders  hearing problems  kidney disease  recent or ongoing radiation therapy  an unusual or allergic reaction to carboplatin, cisplatin, other chemotherapy, other medicines, foods, dyes, or preservatives  pregnant or trying to get pregnant  breast-feeding How should I use this medicine? This drug is usually given as an infusion into a vein. It is administered in a hospital or clinic by a specially trained health care professional. Talk to your pediatrician regarding the use of this medicine in children. Special care may be needed. Overdosage: If you think you have taken too much of this medicine contact a poison control center or emergency room at once. NOTE: This medicine is only for you. Do not share this medicine with others. What if I miss a dose? It is important not to miss a dose. Call your doctor or health care professional if you are unable to keep an appointment. What may interact with this medicine?  medicines for seizures  medicines to increase blood counts like filgrastim, pegfilgrastim, sargramostim  some antibiotics like amikacin, gentamicin, neomycin, streptomycin, tobramycin  vaccines Talk to your doctor or health care professional before taking any of these  medicines:  acetaminophen  aspirin  ibuprofen  ketoprofen  naproxen This list may not describe all possible interactions. Give your health care provider a list of all the medicines, herbs, non-prescription drugs, or dietary supplements you use. Also tell them if you smoke, drink alcohol, or use illegal drugs. Some items may interact with your medicine. What should I watch for while using this medicine? Your condition will be monitored carefully while you are receiving this medicine. You will need important blood work done while you are taking this medicine. This drug may make you feel generally unwell. This is not uncommon, as chemotherapy can affect healthy cells as well as cancer cells. Report any side effects. Continue your course of treatment even though you feel ill unless your doctor tells you to stop. In some cases, you may be given additional medicines to help with side effects. Follow all directions for their use. Call your doctor or health care professional for advice if you get a fever, chills or sore throat, or other symptoms of a cold or flu. Do not treat yourself. This drug decreases your body's ability to fight infections. Try to avoid being around people who are sick. This medicine may increase your  risk to bruise or bleed. Call your doctor or health care professional if you notice any unusual bleeding. Be careful brushing and flossing your teeth or using a toothpick because you may get an infection or bleed more easily. If you have any dental work done, tell your dentist you are receiving this medicine. Avoid taking products that contain aspirin, acetaminophen, ibuprofen, naproxen, or ketoprofen unless instructed by your doctor. These medicines may hide a fever. Do not become pregnant while taking this medicine. Women should inform their doctor if they wish to become pregnant or think they might be pregnant. There is a potential for serious side effects to an unborn child. Talk  to your health care professional or pharmacist for more information. Do not breast-feed an infant while taking this medicine. What side effects may I notice from receiving this medicine? Side effects that you should report to your doctor or health care professional as soon as possible:  allergic reactions like skin rash, itching or hives, swelling of the face, lips, or tongue  signs of infection - fever or chills, cough, sore throat, pain or difficulty passing urine  signs of decreased platelets or bleeding - bruising, pinpoint red spots on the skin, black, tarry stools, nosebleeds  signs of decreased red blood cells - unusually weak or tired, fainting spells, lightheadedness  breathing problems  changes in hearing  changes in vision  chest pain  high blood pressure  low blood counts - This drug may decrease the number of white blood cells, red blood cells and platelets. You may be at increased risk for infections and bleeding.  nausea and vomiting  pain, swelling, redness or irritation at the injection site  pain, tingling, numbness in the hands or feet  problems with balance, talking, walking  trouble passing urine or change in the amount of urine Side effects that usually do not require medical attention (report to your doctor or health care professional if they continue or are bothersome):  hair loss  loss of appetite  metallic taste in the mouth or changes in taste This list may not describe all possible side effects. Call your doctor for medical advice about side effects. You may report side effects to FDA at 1-800-FDA-1088. Where should I keep my medicine? This drug is given in a hospital or clinic and will not be stored at home. NOTE: This sheet is a summary. It may not cover all possible information. If you have questions about this medicine, talk to your doctor, pharmacist, or health care provider.  2021 Elsevier/Gold Standard (2007-12-20 14:38:05)

## 2020-12-31 ENCOUNTER — Encounter: Payer: Self-pay | Admitting: Emergency Medicine

## 2020-12-31 ENCOUNTER — Ambulatory Visit
Admission: RE | Admit: 2020-12-31 | Discharge: 2020-12-31 | Disposition: A | Discharge status: Home / Self Care | Payer: Medicare HMO | Source: Ambulatory Visit | Attending: Radiation Oncology | Admitting: Radiation Oncology

## 2020-12-31 ENCOUNTER — Telehealth: Payer: Self-pay | Admitting: Internal Medicine

## 2020-12-31 ENCOUNTER — Encounter (HOSPITAL_COMMUNITY): Payer: Self-pay | Admitting: Internal Medicine

## 2020-12-31 ENCOUNTER — Encounter: Payer: Self-pay | Admitting: *Deleted

## 2020-12-31 ENCOUNTER — Telehealth: Payer: Self-pay | Admitting: *Deleted

## 2020-12-31 DIAGNOSIS — C3411 Malignant neoplasm of upper lobe, right bronchus or lung: Secondary | ICD-10-CM | POA: Diagnosis not present

## 2020-12-31 DIAGNOSIS — Z510 Encounter for antineoplastic radiation therapy: Secondary | ICD-10-CM | POA: Diagnosis not present

## 2020-12-31 LAB — RESEARCH LABS

## 2020-12-31 NOTE — Telephone Encounter (Signed)
Fine with me - note patient has likely terminal lung cancer in the letter.

## 2020-12-31 NOTE — Telephone Encounter (Signed)
Letter has been written and has been faxed to pt's daughter [name] at the provided fax number by her.  Confirmation was received that the letter went through. Attempted to call [name] but unable to reach. Left detailed message on machine (okay per DPR) letting her know that the letter had been written and faxed to her. Nothing further needed.

## 2020-12-31 NOTE — Telephone Encounter (Signed)
Called pt to see how he did with his treatment yesterday & her reports doing well & denies any side effects & states he knows how to reach [place] for concerns.

## 2020-12-31 NOTE — Telephone Encounter (Signed)
Called and spoke to pt's daughter, Ethlyn Daniels (on [place]). She is requesting a letter stating she cares for pt and takes pt to all his appointments for the foreseeable future. Ethlyn Daniels states this is for her FMLA since she cares for her father.  She wants a call when the form is faxed. Fax number verified (in 'contacts' tab).   Dr. Melvyn Novas, please advise if ok to write/send letter. Thanks.

## 2020-12-31 NOTE — Telephone Encounter (Signed)
-----   Message from Jolaine Click, RN sent at 12/30/2020  9:48 AM EDT ----- Regarding: First Time Taxol and Carboplatin - Dr. Julien Nordmann Patient First Time Taxol and Carboplatin - Dr. Julien Nordmann Patient

## 2021-01-01 ENCOUNTER — Ambulatory Visit
Admission: RE | Admit: 2021-01-01 | Discharge: 2021-01-01 | Disposition: A | Discharge status: Home / Self Care | Payer: Medicare HMO | Source: Ambulatory Visit | Attending: Radiation Oncology | Admitting: Radiation Oncology

## 2021-01-01 DIAGNOSIS — Z510 Encounter for antineoplastic radiation therapy: Secondary | ICD-10-CM | POA: Diagnosis not present

## 2021-01-01 DIAGNOSIS — C3411 Malignant neoplasm of upper lobe, right bronchus or lung: Secondary | ICD-10-CM | POA: Diagnosis not present

## 2021-01-01 DIAGNOSIS — C3492 Malignant neoplasm of unspecified part of left bronchus or lung: Secondary | ICD-10-CM | POA: Diagnosis not present

## 2021-01-01 NOTE — Progress Notes (Signed)
[facility] OFFICE PROGRESS NOTE  [name], MD [address] 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  PDL1: 90%  PRIOR THERAPY: None  CURRENT THERAPY: Weekly concurrent chemoradiation with carboplatin for an AUC of 2 and paclitaxel 45 mg/m.  First dose on 12/30/2020.  Status post 1 cycle.  INTERVAL HISTORY: [name] 87 y.o. male returns to the clinic today for a follow-up visit accompanied by his daughter.  The patient is feeling fairly well today without any concerning complaints.  The patient started his first cycle of chemotherapy last week on 12/31/2018 showed he tolerated well without any concerning adverse side effects. The patient is also currently undergoing radiotherapy under the care of Dr. Tammi Klippel.  His last dose of radiation is scheduled for 02/12/2021.  The patient denies any recent fever, chills, or night sweats. He lost about 10 lbs since his last appointment. He states he doesn't have a taste for anything. Denies thrush. He denies odynophagia/dysphagia. He denies shortness of breath and cough. He denies chest pain or hemoptysis.   He denies any nausea, vomiting, diarrhea, or constipation.  The patient also recently had his staging MRI performed which was negative for metastatic disease to the brain.  The patient is here today for evaluation and repeat blood work before starting cycle #2.  MEDICAL HISTORY: Past Medical History:  Diagnosis Date  . Arthritis   . Hypertension   . Hyperthyroidism     ALLERGIES:  has No Known Allergies.  MEDICATIONS:  Current Outpatient Medications  Medication Sig Dispense Refill  . labetalol (NORMODYNE) 200 MG tablet Take 200 mg  by mouth 2 (two) times daily.    Marland Kitchen levothyroxine (SYNTHROID) 150 MCG tablet Take 150 mcg by mouth daily before breakfast.    . naproxen (NAPROSYN) 500 MG tablet Take 500 mg by mouth 2 (two) times daily as needed for moderate pain. (Patient not taking: Reported on 12/23/2020)    . Olmesartan-Amlodipine-HCTZ 40-10-12.5 MG TABS Take 1 tablet by mouth daily.    Marland Kitchen oxyCODONE-acetaminophen (PERCOCET/ROXICET) 5-325 MG tablet Take 1 tablet by mouth every 8 (eight) hours as needed for severe pain. 30 tablet 0  . prochlorperazine (COMPAZINE) 10 MG tablet Take 1 tablet (10 mg total) by mouth every 6 (six) hours as needed for nausea or vomiting. 30 tablet 0  . tadalafil (CIALIS) 5 MG tablet Take 5 mg by mouth daily.    . tamsulosin (FLOMAX) 0.4 MG CAPS capsule Take 0.4 mg by mouth daily.     No current facility-administered medications for this visit.    SURGICAL HISTORY:  Past Surgical History:  Procedure Laterality Date  . COLONOSCOPY N/A 09/02/2016   Procedure: COLONOSCOPY;  Surgeon: Rogene Houston, MD;  Location: [facility];  Service: Endoscopy;  Laterality: N/A;  830  . NO PAST SURGERIES    . POLYPECTOMY  09/02/2016   Procedure: POLYPECTOMY;  Surgeon: Rogene Houston, MD;  Location: [facility];  Service: Endoscopy;;    REVIEW OF SYSTEMS:   Review of Systems  Constitutional: Positive for decreased appetite and weight loss. Negative for chills, fatigue, and fever. HENT: Negative for mouth sores, nosebleeds, sore throat and trouble swallowing.   Eyes: Negative for eye problems and icterus.  Respiratory:  Negative for cough, hemoptysis, shortness of breath and wheezing.   Cardiovascular: Negative for chest pain and leg swelling.  Gastrointestinal: Negative for abdominal pain, constipation, diarrhea, nausea and vomiting.  Genitourinary: Negative for bladder incontinence, difficulty urinating, dysuria, frequency and hematuria.   Musculoskeletal: Negative for back pain, gait problem, neck pain and  neck stiffness.  Skin: Negative for itching and rash.  Neurological: Negative for dizziness, extremity weakness, gait problem, headaches, light-headedness and seizures.  Hematological: Negative for adenopathy. Does not bruise/bleed easily.  Psychiatric/Behavioral: Negative for confusion, depression and sleep disturbance. The patient is not nervous/anxious.     PHYSICAL EXAMINATION:  Blood pressure 125/60, pulse 66, temperature 97.8 F (36.6 C), temperature source Oral, resp. rate 17, height _0  (1.88 m), weight 137 lb 4.8 oz (62.3 kg), SpO2 97 %.  ECOG PERFORMANCE STATUS: 1 - Symptomatic but completely ambulatory  Physical Exam  Constitutional: Oriented to person, place, and time and thin appearing male and in no distress. HENT:  Head: Normocephalic and atraumatic.  Mouth/Throat: Oropharynx is clear and moist. No oropharyngeal exudate.  Eyes: Conjunctivae are normal. Right eye exhibits no discharge. Left eye exhibits no discharge. No scleral icterus.  Neck: Normal range of motion. Neck supple.  Cardiovascular: Normal rate, regular rhythm, normal heart sounds and intact distal pulses.   Pulmonary/Chest: Effort normal. Quiet breath sounds in all lung fields. No respiratory distress. No wheezes. No rales.  Abdominal: Soft. Bowel sounds are normal. Exhibits no distension and no mass. There is no tenderness.  Musculoskeletal: Normal range of motion. Exhibits no edema.  Lymphadenopathy:    No cervical adenopathy.  Neurological: Alert and oriented to person, place, and time. Exhibits normal muscle tone. Gait normal. Coordination normal.  Skin: Skin is warm and dry. No rash noted. Not diaphoretic. No erythema. No pallor.  Psychiatric: Mood, memory and judgment normal.  Vitals reviewed.  LABORATORY DATA: Lab Results  Component Value Date   WBC 10.5 01/06/2021   HGB 9.5 (L) 01/06/2021   HCT 29.3 (L) 01/06/2021   MCV 85.9 01/06/2021   PLT 299 01/06/2021      Chemistry      Component  Value Date/Time   NA 136 01/06/2021 0931   K 4.3 01/06/2021 0931   CL 101 01/06/2021 0931   CO2 24 01/06/2021 0931   BUN 40 (H) 01/06/2021 0931   CREATININE 2.14 (H) 01/06/2021 0931      Component Value Date/Time   CALCIUM 8.8 (L) 01/06/2021 0931   ALKPHOS 61 01/06/2021 0931   AST 8 (L) 01/06/2021 0931   ALT 9 01/06/2021 0931   BILITOT 0.5 01/06/2021 0931       RADIOGRAPHIC STUDIES:  MR BRAIN W WO CONTRAST  Result Date: 01/03/2021 CLINICAL DATA:  Non-small cell lung cancer staging EXAM: MRI HEAD WITHOUT AND WITH CONTRAST TECHNIQUE: Multiplanar, multiecho pulse sequences of the brain and surrounding structures were obtained without and with intravenous contrast. CONTRAST:  36m GADAVIST GADOBUTROL 1 MMOL/ML IV SOLN COMPARISON:  None. FINDINGS: Brain: No swelling or enhancement to suggest metastatic disease. Prominence of dural thickness but smooth and overall benign appearing. No brain sagging. FLAIR hyperintensity that is confluent in the deep cerebral white matter, consistent with chronic small vessel ischemia. No infarct, hemorrhage, or hydrocephalus. Vascular: Normal flow voids and vascular enhancements. Skull and upper cervical spine: Decreased T1 signal throughout the visualized cervical spine with heterogeneity of marrow signal at the clivus. No hematogenous osseous metastatic disease by recent PET-CT. Findings may relate to anemia and treatment. Sinuses/Orbits:  Negative IMPRESSION: 1. Negative for intracranial metastatic disease. 2. Chronic small vessel ischemia. Electronically Signed   By: Monte Fantasia M.D.   On: 01/03/2021 07:51   CT Biopsy  Result Date: 12/12/2020 INDICATION: 87 year old with a large destructive mass involving the right upper lung and right chest wall. Tissue diagnosis is needed. EXAM: CT-GUIDED CORE BIOPSY OF RIGHT CHEST MASS MEDICATIONS: None. ANESTHESIA/SEDATION: Moderate (conscious) sedation was employed during this procedure. A total of Versed 0.5 mg and  Fentanyl 25 mcg was administered intravenously. Moderate Sedation Time: 10 minutes. The patient's level of consciousness and vital signs were monitored continuously by radiology nursing throughout the procedure under my direct supervision. FLUOROSCOPY TIME:  None COMPLICATIONS: None immediate. PROCEDURE: Informed written consent was obtained from the patient after a thorough discussion of the procedural risks, benefits and alternatives. All questions were addressed. A timeout was performed prior to the initiation of the procedure. Patient was placed supine. CT images through the chest were obtained. The large mass in the right chest was identified and targeted. The overlying skin was prepped with chlorhexidine and sterile field was created. Skin was anesthetized using 1% lidocaine. Small incision was made. 17 gauge coaxial needle was directed into the mass and position was confirmed with CT. Total of 3 core biopsies were obtained with an 18 gauge core device. Specimens placed in formalin. Needle was removed without complication. Bandage placed over the puncture site. FINDINGS: Large mass in the right upper lobe that extends through the right chest wall with bony destruction of the right third rib. Severe emphysematous changes. Needle was directed along the lateral aspect of the chest wall mass. Adequate specimens obtained. IMPRESSION: CT-guided core biopsy of the right chest wall mass. Electronically Signed   By: Markus Daft M.D.   On: 12/12/2020 17:56   DG Chest Port 1 View  Result Date: 12/12/2020 CLINICAL DATA:  RIGHT lung mass, post biopsy. EXAM: PORTABLE CHEST 1 VIEW COMPARISON:  Biopsy images from December 12, 2020 and CT of the chest from October 23, 2020 FINDINGS: Trachea midline. Cardiomediastinal contours and hilar structures are unremarkable. Mass destroying RIGHT-sided ribs 2 and 3 measuring approximately 8 x 5 cm as on biopsy images and prior PET images. No visible pneumothorax. No lobar consolidation.  Background pulmonary emphysema. Rib destruction as above. IMPRESSION: Chest wall mass destroying RIGHT-sided ribs along the upper chest as seen on prior images. No sign of pneumothorax. Electronically Signed   By: Zetta Bills M.D.   On: 12/12/2020 13:31     ASSESSMENT/PLAN:  This is a very pleasant 87 years old African-American male recently diagnosed with stage IVA (T4, N3, M1b) non-small cell lung cancer, adenosquamous carcinoma in March 2022 and presented with large mass involving the lateral right upper lobe and right chest wall soft tissue in addition to right hilar, mediastinal and right subpectoral and supraclavicular lymphadenopathy.  His PDL 1 expression is 90%.    The patient is currently undergoing weekly concurrent chemoradiation with carboplatin for an AUC of 2 and paclitaxel 45 mg per metered squared.  He is status post 1 cycle and tolerated treatment well.  He is currently undergoing radiation under the care of Dr. Tammi Klippel.   Labs were reviewed.  Recommend that he proceed with cycle #2 she is scheduled. He has baseline CKD.  We will see the patient back for follow-up visit in 2 weeks for evaluation before starting cycle #4.  I discussed with the patient that I am concerned about his weight loss. Especially since  he may develop odynophagia and dysphagia in the coming few weeks due to his concurrent radiation. I reinforced how important it is to get good nutrition while he still does not have any issues swallowing. Encouraged high protein-calorie diet. Additionally, he has hypoalbuminemia on labs. He was instructed to take boost/ensure. I have placed a referral to the nutritionist as well.   The patient was advised to call immediately if he has any concerning symptoms in the interval. The patient voices understanding of current disease status and treatment options and is in agreement with the current care plan. All questions were answered. The patient knows to call the clinic with  any problems, questions or concerns. We can certainly see the patient much sooner if necessary     Orders Placed This Encounter  Procedures  . Ambulatory Referral to [facility] Nutrition    Referral Priority:   Routine    Referral Type:   Consultation    Referral Reason:   Specialty Services Required    Number of Visits Requested:   1     I spent 20-29 minutes in this encounter.   Nethra Mehlberg L Kaylor Simenson, PA-C 01/06/21

## 2021-01-02 ENCOUNTER — Ambulatory Visit (HOSPITAL_COMMUNITY)
Admission: RE | Admit: 2021-01-02 | Discharge: 2021-01-02 | Disposition: A | Discharge status: Home / Self Care | Payer: Medicare HMO | Source: Ambulatory Visit | Attending: Internal Medicine | Admitting: Internal Medicine

## 2021-01-02 ENCOUNTER — Encounter (HOSPITAL_COMMUNITY): Payer: Self-pay | Admitting: Internal Medicine

## 2021-01-02 ENCOUNTER — Ambulatory Visit
Admission: RE | Admit: 2021-01-02 | Discharge: 2021-01-02 | Disposition: A | Discharge status: Home / Self Care | Payer: Medicare HMO | Source: Ambulatory Visit | Attending: Radiation Oncology | Admitting: Radiation Oncology

## 2021-01-02 ENCOUNTER — Other Ambulatory Visit: Payer: Self-pay

## 2021-01-02 DIAGNOSIS — C3490 Malignant neoplasm of unspecified part of unspecified bronchus or lung: Secondary | ICD-10-CM | POA: Diagnosis not present

## 2021-01-02 DIAGNOSIS — I6782 Cerebral ischemia: Secondary | ICD-10-CM | POA: Diagnosis not present

## 2021-01-02 DIAGNOSIS — Z510 Encounter for antineoplastic radiation therapy: Secondary | ICD-10-CM | POA: Diagnosis not present

## 2021-01-02 DIAGNOSIS — C3411 Malignant neoplasm of upper lobe, right bronchus or lung: Secondary | ICD-10-CM | POA: Diagnosis not present

## 2021-01-02 MED ORDER — GADOBUTROL 1 MMOL/ML IV SOLN
6.0000 mL | Freq: Once | INTRAVENOUS | Status: AC | PRN
  Administered 2021-01-02: 6 mL via INTRAVENOUS

## 2021-01-03 ENCOUNTER — Other Ambulatory Visit: Payer: Self-pay | Admitting: Physician Assistant

## 2021-01-03 ENCOUNTER — Ambulatory Visit
Admission: RE | Admit: 2021-01-03 | Discharge: 2021-01-03 | Disposition: A | Discharge status: Home / Self Care | Payer: Medicare HMO | Source: Ambulatory Visit | Attending: Radiation Oncology | Admitting: Radiation Oncology

## 2021-01-03 DIAGNOSIS — C3411 Malignant neoplasm of upper lobe, right bronchus or lung: Secondary | ICD-10-CM | POA: Diagnosis not present

## 2021-01-03 DIAGNOSIS — Z510 Encounter for antineoplastic radiation therapy: Secondary | ICD-10-CM | POA: Diagnosis not present

## 2021-01-03 MED ORDER — SONAFINE EX EMUL
1.0000 | Freq: Two times a day (BID) | CUTANEOUS | Status: DC
Start: 2021-01-03 — End: 2021-01-04
  Administered 2021-01-03: 1 via TOPICAL

## 2021-01-06 ENCOUNTER — Inpatient Hospital Stay: Payer: Medicare HMO

## 2021-01-06 ENCOUNTER — Other Ambulatory Visit: Payer: Self-pay

## 2021-01-06 ENCOUNTER — Inpatient Hospital Stay (HOSPITAL_BASED_OUTPATIENT_CLINIC_OR_DEPARTMENT_OTHER): Payer: Medicare HMO | Admitting: Physician Assistant

## 2021-01-06 ENCOUNTER — Ambulatory Visit
Admission: RE | Admit: 2021-01-06 | Discharge: 2021-01-06 | Disposition: A | Discharge status: Home / Self Care | Payer: Medicare HMO | Source: Ambulatory Visit | Attending: Radiation Oncology | Admitting: Radiation Oncology

## 2021-01-06 VITALS — BP 125/60 | HR 66 | Temp 97.8°F | Resp 17 | Ht 74.0 in | Wt 137.3 lb

## 2021-01-06 DIAGNOSIS — C3411 Malignant neoplasm of upper lobe, right bronchus or lung: Secondary | ICD-10-CM | POA: Diagnosis not present

## 2021-01-06 DIAGNOSIS — C778 Secondary and unspecified malignant neoplasm of lymph nodes of multiple regions: Secondary | ICD-10-CM | POA: Diagnosis not present

## 2021-01-06 DIAGNOSIS — C7989 Secondary malignant neoplasm of other specified sites: Secondary | ICD-10-CM | POA: Diagnosis not present

## 2021-01-06 DIAGNOSIS — Z5111 Encounter for antineoplastic chemotherapy: Secondary | ICD-10-CM

## 2021-01-06 DIAGNOSIS — Z510 Encounter for antineoplastic radiation therapy: Secondary | ICD-10-CM | POA: Diagnosis not present

## 2021-01-06 LAB — CBC WITH DIFFERENTIAL (CANCER CENTER ONLY)
Abs Immature Granulocytes: 0.14 10*3/uL — ABNORMAL HIGH (ref 0.00–0.07)
Basophils Absolute: 0 10*3/uL (ref 0.0–0.1)
Basophils Relative: 0 %
Eosinophils Absolute: 0.1 10*3/uL (ref 0.0–0.5)
Eosinophils Relative: 1 %
HCT: 29.3 % — ABNORMAL LOW (ref 39.0–52.0)
Hemoglobin: 9.5 g/dL — ABNORMAL LOW (ref 13.0–17.0)
Immature Granulocytes: 1 %
Lymphocytes Relative: 4 %
Lymphs Abs: 0.4 10*3/uL — ABNORMAL LOW (ref 0.7–4.0)
MCH: 27.9 pg (ref 26.0–34.0)
MCHC: 32.4 g/dL (ref 30.0–36.0)
MCV: 85.9 fL (ref 80.0–100.0)
Monocytes Absolute: 0.4 10*3/uL (ref 0.1–1.0)
Monocytes Relative: 3 %
Neutro Abs: 9.4 10*3/uL — ABNORMAL HIGH (ref 1.7–7.7)
Neutrophils Relative %: 91 %
Platelet Count: 299 10*3/uL (ref 150–400)
RBC: 3.41 MIL/uL — ABNORMAL LOW (ref 4.22–5.81)
RDW: 14.6 % (ref 11.5–15.5)
WBC Count: 10.5 10*3/uL (ref 4.0–10.5)
nRBC: 0 % (ref 0.0–0.2)

## 2021-01-06 LAB — CMP (CANCER CENTER ONLY)
ALT: 9 U/L (ref 0–44)
AST: 8 U/L — ABNORMAL LOW (ref 15–41)
Albumin: 2.5 g/dL — ABNORMAL LOW (ref 3.5–5.0)
Alkaline Phosphatase: 61 U/L (ref 38–126)
Anion gap: 11 (ref 5–15)
BUN: 40 mg/dL — ABNORMAL HIGH (ref 8–23)
CO2: 24 mmol/L (ref 22–32)
Calcium: 8.8 mg/dL — ABNORMAL LOW (ref 8.9–10.3)
Chloride: 101 mmol/L (ref 98–111)
Creatinine: 2.14 mg/dL — ABNORMAL HIGH (ref 0.61–1.24)
GFR, Estimated: 30 mL/min — ABNORMAL LOW (ref >60)
Glucose, Bld: 107 mg/dL — ABNORMAL HIGH (ref 70–99)
Potassium: 4.3 mmol/L (ref 3.5–5.1)
Sodium: 136 mmol/L (ref 135–145)
Total Bilirubin: 0.5 mg/dL (ref 0.3–1.2)
Total Protein: 6.9 g/dL (ref 6.5–8.1)

## 2021-01-06 MED ORDER — FAMOTIDINE IN NACL 20-0.9 MG/50ML-% IV SOLN
INTRAVENOUS | Status: AC
  Filled 2021-01-06: qty 50

## 2021-01-06 MED ORDER — DIPHENHYDRAMINE HCL 50 MG/ML IJ SOLN
INTRAMUSCULAR | Status: AC
  Filled 2021-01-06: qty 1

## 2021-01-06 MED ORDER — SODIUM CHLORIDE 0.9 % IV SOLN
45.0000 mg/m2 | Freq: Once | INTRAVENOUS | Status: AC
  Administered 2021-01-06: 84 mg via INTRAVENOUS
  Filled 2021-01-06: qty 14

## 2021-01-06 MED ORDER — SODIUM CHLORIDE 0.9 % IV SOLN
Freq: Once | INTRAVENOUS | Status: AC
Start: 2021-01-06 — End: 2021-01-06
  Filled 2021-01-06: qty 250

## 2021-01-06 MED ORDER — PALONOSETRON HCL INJECTION 0.25 MG/5ML
INTRAVENOUS | Status: AC
  Filled 2021-01-06: qty 5

## 2021-01-06 MED ORDER — SODIUM CHLORIDE 0.9 % IV SOLN
20.0000 mg | Freq: Once | INTRAVENOUS | Status: AC
  Administered 2021-01-06: 20 mg via INTRAVENOUS
  Filled 2021-01-06: qty 20

## 2021-01-06 MED ORDER — SODIUM CHLORIDE 0.9 % IV SOLN
100.0000 mg | Freq: Once | INTRAVENOUS | Status: AC
  Administered 2021-01-06: 100 mg via INTRAVENOUS
  Filled 2021-01-06: qty 10

## 2021-01-06 MED ORDER — PALONOSETRON HCL INJECTION 0.25 MG/5ML
0.2500 mg | Freq: Once | INTRAVENOUS | Status: AC
  Administered 2021-01-06: 0.25 mg via INTRAVENOUS

## 2021-01-06 MED ORDER — FAMOTIDINE IN NACL 20-0.9 MG/50ML-% IV SOLN
20.0000 mg | Freq: Once | INTRAVENOUS | Status: AC
  Administered 2021-01-06: 20 mg via INTRAVENOUS

## 2021-01-06 MED ORDER — DIPHENHYDRAMINE HCL 50 MG/ML IJ SOLN
50.0000 mg | Freq: Once | INTRAMUSCULAR | Status: AC
  Administered 2021-01-06: 50 mg via INTRAVENOUS

## 2021-01-06 NOTE — Patient Instructions (Signed)
[facility] Discharge Instructions for Patients Receiving Chemotherapy  Today you received the following chemotherapy agents: Paclitaxel, Carboplatin  To help prevent nausea and vomiting after your treatment, we encourage you to take your nausea medication as directed.   If you develop nausea and vomiting that is not controlled by your nausea medication, call the clinic.   BELOW ARE SYMPTOMS THAT SHOULD BE REPORTED IMMEDIATELY:  *FEVER GREATER THAN 100.5 F  *CHILLS WITH OR WITHOUT FEVER  NAUSEA AND VOMITING THAT IS NOT CONTROLLED WITH YOUR NAUSEA MEDICATION  *UNUSUAL SHORTNESS OF BREATH  *UNUSUAL BRUISING OR BLEEDING  TENDERNESS IN MOUTH AND THROAT WITH OR WITHOUT PRESENCE OF ULCERS  *URINARY PROBLEMS  *BOWEL PROBLEMS  UNUSUAL RASH Items with * indicate a potential emergency and should be followed up as soon as possible.  Feel free to call the clinic should you have any questions or concerns. The clinic phone number is [phone].  Please show the [facility] at check-in to the Emergency Department and triage nurse.

## 2021-01-07 ENCOUNTER — Ambulatory Visit
Admission: RE | Admit: 2021-01-07 | Discharge: 2021-01-07 | Disposition: A | Discharge status: Home / Self Care | Payer: Medicare HMO | Source: Ambulatory Visit | Attending: Radiation Oncology | Admitting: Radiation Oncology

## 2021-01-07 ENCOUNTER — Encounter (HOSPITAL_COMMUNITY): Payer: Self-pay

## 2021-01-07 DIAGNOSIS — Z510 Encounter for antineoplastic radiation therapy: Secondary | ICD-10-CM | POA: Diagnosis not present

## 2021-01-07 DIAGNOSIS — C3411 Malignant neoplasm of upper lobe, right bronchus or lung: Secondary | ICD-10-CM | POA: Diagnosis not present

## 2021-01-08 ENCOUNTER — Other Ambulatory Visit: Payer: Self-pay

## 2021-01-08 ENCOUNTER — Ambulatory Visit
Admission: RE | Admit: 2021-01-08 | Discharge: 2021-01-08 | Disposition: A | Discharge status: Home / Self Care | Payer: Medicare HMO | Source: Ambulatory Visit | Attending: Radiation Oncology | Admitting: Radiation Oncology

## 2021-01-08 DIAGNOSIS — C3411 Malignant neoplasm of upper lobe, right bronchus or lung: Secondary | ICD-10-CM | POA: Diagnosis not present

## 2021-01-08 DIAGNOSIS — Z510 Encounter for antineoplastic radiation therapy: Secondary | ICD-10-CM | POA: Diagnosis not present

## 2021-01-09 ENCOUNTER — Ambulatory Visit
Admission: RE | Admit: 2021-01-09 | Discharge: 2021-01-09 | Disposition: A | Discharge status: Home / Self Care | Payer: Medicare HMO | Source: Ambulatory Visit | Attending: Radiation Oncology | Admitting: Radiation Oncology

## 2021-01-09 DIAGNOSIS — C3411 Malignant neoplasm of upper lobe, right bronchus or lung: Secondary | ICD-10-CM | POA: Diagnosis not present

## 2021-01-09 DIAGNOSIS — Z510 Encounter for antineoplastic radiation therapy: Secondary | ICD-10-CM | POA: Diagnosis not present

## 2021-01-10 ENCOUNTER — Other Ambulatory Visit: Payer: Self-pay

## 2021-01-10 ENCOUNTER — Ambulatory Visit
Admission: RE | Admit: 2021-01-10 | Discharge: 2021-01-10 | Disposition: A | Discharge status: Home / Self Care | Payer: Medicare HMO | Source: Ambulatory Visit | Attending: Radiation Oncology | Admitting: Radiation Oncology

## 2021-01-10 DIAGNOSIS — C3411 Malignant neoplasm of upper lobe, right bronchus or lung: Secondary | ICD-10-CM | POA: Diagnosis not present

## 2021-01-10 DIAGNOSIS — Z510 Encounter for antineoplastic radiation therapy: Secondary | ICD-10-CM | POA: Diagnosis not present

## 2021-01-13 ENCOUNTER — Inpatient Hospital Stay: Payer: Medicare HMO

## 2021-01-13 ENCOUNTER — Other Ambulatory Visit: Payer: Self-pay

## 2021-01-13 ENCOUNTER — Other Ambulatory Visit: Payer: Self-pay | Admitting: Medical

## 2021-01-13 ENCOUNTER — Encounter: Payer: Medicare HMO | Admitting: Nutrition

## 2021-01-13 ENCOUNTER — Ambulatory Visit
Admission: RE | Admit: 2021-01-13 | Discharge: 2021-01-13 | Disposition: A | Discharge status: Home / Self Care | Payer: Medicare HMO | Source: Ambulatory Visit | Attending: Radiation Oncology | Admitting: Radiation Oncology

## 2021-01-13 ENCOUNTER — Inpatient Hospital Stay: Payer: Medicare HMO | Admitting: Nutrition

## 2021-01-13 VITALS — BP 114/64 | HR 69 | Temp 97.6°F | Resp 18

## 2021-01-13 DIAGNOSIS — C3411 Malignant neoplasm of upper lobe, right bronchus or lung: Secondary | ICD-10-CM | POA: Diagnosis not present

## 2021-01-13 DIAGNOSIS — C7989 Secondary malignant neoplasm of other specified sites: Secondary | ICD-10-CM | POA: Diagnosis not present

## 2021-01-13 DIAGNOSIS — Z5111 Encounter for antineoplastic chemotherapy: Secondary | ICD-10-CM | POA: Diagnosis not present

## 2021-01-13 DIAGNOSIS — C778 Secondary and unspecified malignant neoplasm of lymph nodes of multiple regions: Secondary | ICD-10-CM | POA: Diagnosis not present

## 2021-01-13 DIAGNOSIS — Z510 Encounter for antineoplastic radiation therapy: Secondary | ICD-10-CM | POA: Diagnosis not present

## 2021-01-13 LAB — CBC WITH DIFFERENTIAL (CANCER CENTER ONLY)
Abs Immature Granulocytes: 0.09 10*3/uL — ABNORMAL HIGH (ref 0.00–0.07)
Basophils Absolute: 0 10*3/uL (ref 0.0–0.1)
Basophils Relative: 0 %
Eosinophils Absolute: 0.1 10*3/uL (ref 0.0–0.5)
Eosinophils Relative: 1 %
HCT: 30.9 % — ABNORMAL LOW (ref 39.0–52.0)
Hemoglobin: 9.6 g/dL — ABNORMAL LOW (ref 13.0–17.0)
Immature Granulocytes: 1 %
Lymphocytes Relative: 5 %
Lymphs Abs: 0.4 10*3/uL — ABNORMAL LOW (ref 0.7–4.0)
MCH: 27.4 pg (ref 26.0–34.0)
MCHC: 31.1 g/dL (ref 30.0–36.0)
MCV: 88 fL (ref 80.0–100.0)
Monocytes Absolute: 0.4 10*3/uL (ref 0.1–1.0)
Monocytes Relative: 5 %
Neutro Abs: 5.9 10*3/uL (ref 1.7–7.7)
Neutrophils Relative %: 88 %
Platelet Count: 249 10*3/uL (ref 150–400)
RBC: 3.51 MIL/uL — ABNORMAL LOW (ref 4.22–5.81)
RDW: 15 % (ref 11.5–15.5)
WBC Count: 6.8 10*3/uL (ref 4.0–10.5)
nRBC: 0 % (ref 0.0–0.2)

## 2021-01-13 LAB — CMP (CANCER CENTER ONLY)
ALT: 7 U/L (ref 0–44)
AST: 7 U/L — ABNORMAL LOW (ref 15–41)
Albumin: 2.5 g/dL — ABNORMAL LOW (ref 3.5–5.0)
Alkaline Phosphatase: 60 U/L (ref 38–126)
Anion gap: 10 (ref 5–15)
BUN: 36 mg/dL — ABNORMAL HIGH (ref 8–23)
CO2: 26 mmol/L (ref 22–32)
Calcium: 8.7 mg/dL — ABNORMAL LOW (ref 8.9–10.3)
Chloride: 99 mmol/L (ref 98–111)
Creatinine: 1.78 mg/dL — ABNORMAL HIGH (ref 0.61–1.24)
GFR, Estimated: 37 mL/min — ABNORMAL LOW (ref >60)
Glucose, Bld: 103 mg/dL — ABNORMAL HIGH (ref 70–99)
Potassium: 5.3 mmol/L — ABNORMAL HIGH (ref 3.5–5.1)
Sodium: 135 mmol/L (ref 135–145)
Total Bilirubin: 0.4 mg/dL (ref 0.3–1.2)
Total Protein: 6.9 g/dL (ref 6.5–8.1)

## 2021-01-13 MED ORDER — DIPHENHYDRAMINE HCL 50 MG/ML IJ SOLN
INTRAMUSCULAR | Status: AC
  Filled 2021-01-13: qty 1

## 2021-01-13 MED ORDER — SODIUM CHLORIDE 0.9 % IV SOLN
45.0000 mg/m2 | Freq: Once | INTRAVENOUS | Status: AC
  Administered 2021-01-13: 84 mg via INTRAVENOUS
  Filled 2021-01-13: qty 14

## 2021-01-13 MED ORDER — PALONOSETRON HCL INJECTION 0.25 MG/5ML
INTRAVENOUS | Status: AC
  Filled 2021-01-13: qty 5

## 2021-01-13 MED ORDER — SODIUM CHLORIDE 0.9 % IV SOLN
20.0000 mg | Freq: Once | INTRAVENOUS | Status: AC
  Administered 2021-01-13: 20 mg via INTRAVENOUS
  Filled 2021-01-13: qty 20

## 2021-01-13 MED ORDER — PALONOSETRON HCL INJECTION 0.25 MG/5ML
0.2500 mg | Freq: Once | INTRAVENOUS | Status: AC
Start: 2021-01-13 — End: 2021-01-13
  Administered 2021-01-13: 0.25 mg via INTRAVENOUS

## 2021-01-13 MED ORDER — FAMOTIDINE IN NACL 20-0.9 MG/50ML-% IV SOLN
20.0000 mg | Freq: Once | INTRAVENOUS | Status: AC
  Administered 2021-01-13: 20 mg via INTRAVENOUS

## 2021-01-13 MED ORDER — DIPHENHYDRAMINE HCL 50 MG/ML IJ SOLN
50.0000 mg | Freq: Once | INTRAMUSCULAR | Status: AC
  Administered 2021-01-13: 50 mg via INTRAVENOUS

## 2021-01-13 MED ORDER — FAMOTIDINE IN NACL 20-0.9 MG/50ML-% IV SOLN
INTRAVENOUS | Status: AC
  Filled 2021-01-13: qty 50

## 2021-01-13 MED ORDER — SODIUM CHLORIDE 0.9 % IV SOLN
Freq: Once | INTRAVENOUS | Status: AC
  Filled 2021-01-13: qty 250

## 2021-01-13 MED ORDER — SODIUM CHLORIDE 0.9 % IV SOLN
105.6000 mg | Freq: Once | INTRAVENOUS | Status: AC
  Administered 2021-01-13: 110 mg via INTRAVENOUS
  Filled 2021-01-13: qty 11

## 2021-01-13 MED ORDER — GABAPENTIN 250 MG/5ML PO SOLN: 150.0000 mg | Freq: Every day | ORAL | 2 refills | Status: DC

## 2021-01-13 NOTE — Progress Notes (Signed)
87 year old male diagnosed with lung cancer followed by Dr. Julien Nordmann.  He is receiving Taxol/carbo.  Past medical history includes hypothyroidism and hypertension.  Medications include Synthroid and Compazine.  Labs include potassium 5.3, glucose 103, BUN 36, creatinine 1.78, and albumin 2.5 on April 18.  Height: 6 feet 2 inches. Weight: 137.3 pounds April 11. Usual body weight: 149.2 pounds February 2022. BMI: 17.63. (Underweight)  Patient reports he thinks he is doing pretty well.  He denies poor appetite but says he drinks 3-4 cartons of Ensure or boost every day.  He prefers [employer].  He denies any problems eating at this time including chewing and swallowing problems nausea and vomiting or diarrhea and constipation.  Upon further questioning, patient appears to have a decreased appetite.  Nutrition diagnosis: Unintended weight loss related to inadequate oral intake as evidenced by 8% weight loss in less than 3 months.  Intervention: Patient educated to try to increase calories and protein by choosing smaller amounts of food more often. Reviewed nutrition fact sheet providing additional information about high-calorie, high-protein meals and snacks. Recommended patient continue 3-4 cartons of Ensure plus or equivalent daily.  I provided coupons. Educated on strategies for appetite improvement. Questions were answered and teach back method used.  Monitoring, evaluation, goals: Patient will tolerate increased calories and protein to minimize weight loss.  Next visit: Monday, May 2 during infusion.  **Disclaimer: This note was dictated with voice recognition software. Similar sounding words can inadvertently be transcribed and this note may contain transcription errors which may not have been corrected upon publication of note.**

## 2021-01-13 NOTE — Patient Instructions (Signed)
[facility] Discharge Instructions for Patients Receiving Chemotherapy  Today you received the following chemotherapy agents: Paclitaxel, Carboplatin  To help prevent nausea and vomiting after your treatment, we encourage you to take your nausea medication as directed.   If you develop nausea and vomiting that is not controlled by your nausea medication, call the clinic.   BELOW ARE SYMPTOMS THAT SHOULD BE REPORTED IMMEDIATELY:  *FEVER GREATER THAN 100.5 F  *CHILLS WITH OR WITHOUT FEVER  NAUSEA AND VOMITING THAT IS NOT CONTROLLED WITH YOUR NAUSEA MEDICATION  *UNUSUAL SHORTNESS OF BREATH  *UNUSUAL BRUISING OR BLEEDING  TENDERNESS IN MOUTH AND THROAT WITH OR WITHOUT PRESENCE OF ULCERS  *URINARY PROBLEMS  *BOWEL PROBLEMS  UNUSUAL RASH Items with * indicate a potential emergency and should be followed up as soon as possible.  Feel free to call the clinic should you have any questions or concerns. The clinic phone number is [phone].  Please show the [facility] at check-in to the Emergency Department and triage nurse.

## 2021-01-13 NOTE — Progress Notes (Signed)
Per Dr. Julien Nordmann, ok to treat with Scr 1.78.  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 States "my toes feel like I got gout."   Dr. Julien Nordmann notified, per Dr. Julien Nordmann via secure chat "proceed. thank you. too early for chemo to make his neuropathy worse. very low dose of taxol. thank you"  Sandi Mealy, PA also made aware of patient's neuropathy complaints and will see patient in infusion area.

## 2021-01-14 ENCOUNTER — Ambulatory Visit
Admission: RE | Admit: 2021-01-14 | Discharge: 2021-01-14 | Disposition: A | Discharge status: Home / Self Care | Payer: Medicare HMO | Source: Ambulatory Visit | Attending: Radiation Oncology | Admitting: Radiation Oncology

## 2021-01-14 DIAGNOSIS — C3411 Malignant neoplasm of upper lobe, right bronchus or lung: Secondary | ICD-10-CM | POA: Diagnosis not present

## 2021-01-14 DIAGNOSIS — Z510 Encounter for antineoplastic radiation therapy: Secondary | ICD-10-CM | POA: Diagnosis not present

## 2021-01-15 ENCOUNTER — Ambulatory Visit
Admission: RE | Admit: 2021-01-15 | Discharge: 2021-01-15 | Disposition: A | Discharge status: Home / Self Care | Payer: Medicare HMO | Source: Ambulatory Visit | Attending: Radiation Oncology | Admitting: Radiation Oncology

## 2021-01-15 ENCOUNTER — Other Ambulatory Visit: Payer: Self-pay

## 2021-01-15 DIAGNOSIS — C3411 Malignant neoplasm of upper lobe, right bronchus or lung: Secondary | ICD-10-CM | POA: Diagnosis not present

## 2021-01-15 DIAGNOSIS — Z510 Encounter for antineoplastic radiation therapy: Secondary | ICD-10-CM | POA: Diagnosis not present

## 2021-01-16 ENCOUNTER — Ambulatory Visit
Admission: RE | Admit: 2021-01-16 | Discharge: 2021-01-16 | Disposition: A | Discharge status: Home / Self Care | Payer: Medicare HMO | Source: Ambulatory Visit | Attending: Radiation Oncology | Admitting: Radiation Oncology

## 2021-01-16 DIAGNOSIS — Z510 Encounter for antineoplastic radiation therapy: Secondary | ICD-10-CM | POA: Diagnosis not present

## 2021-01-16 DIAGNOSIS — C3411 Malignant neoplasm of upper lobe, right bronchus or lung: Secondary | ICD-10-CM | POA: Diagnosis not present

## 2021-01-17 ENCOUNTER — Other Ambulatory Visit: Payer: Self-pay

## 2021-01-17 ENCOUNTER — Ambulatory Visit
Admission: RE | Admit: 2021-01-17 | Discharge: 2021-01-17 | Disposition: A | Discharge status: Home / Self Care | Payer: Medicare HMO | Source: Ambulatory Visit | Attending: Radiation Oncology | Admitting: Radiation Oncology

## 2021-01-17 DIAGNOSIS — Z510 Encounter for antineoplastic radiation therapy: Secondary | ICD-10-CM | POA: Diagnosis not present

## 2021-01-17 DIAGNOSIS — C3411 Malignant neoplasm of upper lobe, right bronchus or lung: Secondary | ICD-10-CM | POA: Diagnosis not present

## 2021-01-20 ENCOUNTER — Other Ambulatory Visit: Payer: Self-pay | Admitting: Physician Assistant

## 2021-01-20 ENCOUNTER — Inpatient Hospital Stay: Payer: Medicare HMO

## 2021-01-20 ENCOUNTER — Other Ambulatory Visit: Payer: Self-pay

## 2021-01-20 ENCOUNTER — Ambulatory Visit
Admission: RE | Admit: 2021-01-20 | Discharge: 2021-01-20 | Disposition: A | Discharge status: Home / Self Care | Payer: Medicare HMO | Source: Ambulatory Visit | Attending: Radiation Oncology | Admitting: Radiation Oncology

## 2021-01-20 ENCOUNTER — Other Ambulatory Visit: Payer: Medicare HMO

## 2021-01-20 ENCOUNTER — Inpatient Hospital Stay (HOSPITAL_BASED_OUTPATIENT_CLINIC_OR_DEPARTMENT_OTHER): Payer: Medicare HMO | Admitting: Internal Medicine

## 2021-01-20 VITALS — BP 122/57 | HR 76 | Temp 98.2°F | Resp 19 | Ht 74.0 in | Wt 135.5 lb

## 2021-01-20 DIAGNOSIS — C778 Secondary and unspecified malignant neoplasm of lymph nodes of multiple regions: Secondary | ICD-10-CM | POA: Diagnosis not present

## 2021-01-20 DIAGNOSIS — Z5111 Encounter for antineoplastic chemotherapy: Secondary | ICD-10-CM

## 2021-01-20 DIAGNOSIS — C3411 Malignant neoplasm of upper lobe, right bronchus or lung: Secondary | ICD-10-CM

## 2021-01-20 DIAGNOSIS — Z510 Encounter for antineoplastic radiation therapy: Secondary | ICD-10-CM | POA: Diagnosis not present

## 2021-01-20 DIAGNOSIS — C7989 Secondary malignant neoplasm of other specified sites: Secondary | ICD-10-CM | POA: Diagnosis not present

## 2021-01-20 DIAGNOSIS — D649 Anemia, unspecified: Secondary | ICD-10-CM

## 2021-01-20 LAB — CMP (CANCER CENTER ONLY)
ALT: 10 U/L (ref 0–44)
AST: 7 U/L — ABNORMAL LOW (ref 15–41)
Albumin: 2.4 g/dL — ABNORMAL LOW (ref 3.5–5.0)
Alkaline Phosphatase: 53 U/L (ref 38–126)
Anion gap: 10 (ref 5–15)
BUN: 44 mg/dL — ABNORMAL HIGH (ref 8–23)
CO2: 28 mmol/L (ref 22–32)
Calcium: 8.4 mg/dL — ABNORMAL LOW (ref 8.9–10.3)
Chloride: 100 mmol/L (ref 98–111)
Creatinine: 2.06 mg/dL — ABNORMAL HIGH (ref 0.61–1.24)
GFR, Estimated: 31 mL/min — ABNORMAL LOW (ref >60)
Glucose, Bld: 117 mg/dL — ABNORMAL HIGH (ref 70–99)
Potassium: 4.5 mmol/L (ref 3.5–5.1)
Sodium: 138 mmol/L (ref 135–145)
Total Bilirubin: 0.4 mg/dL (ref 0.3–1.2)
Total Protein: 6.4 g/dL — ABNORMAL LOW (ref 6.5–8.1)

## 2021-01-20 LAB — CBC WITH DIFFERENTIAL (CANCER CENTER ONLY)
Abs Immature Granulocytes: 0.04 10*3/uL (ref 0.00–0.07)
Basophils Absolute: 0 10*3/uL (ref 0.0–0.1)
Basophils Relative: 0 %
Eosinophils Absolute: 0 10*3/uL (ref 0.0–0.5)
Eosinophils Relative: 1 %
HCT: 27.6 % — ABNORMAL LOW (ref 39.0–52.0)
Hemoglobin: 8.7 g/dL — ABNORMAL LOW (ref 13.0–17.0)
Immature Granulocytes: 1 %
Lymphocytes Relative: 4 %
Lymphs Abs: 0.2 10*3/uL — ABNORMAL LOW (ref 0.7–4.0)
MCH: 28.1 pg (ref 26.0–34.0)
MCHC: 31.5 g/dL (ref 30.0–36.0)
MCV: 89 fL (ref 80.0–100.0)
Monocytes Absolute: 0.3 10*3/uL (ref 0.1–1.0)
Monocytes Relative: 5 %
Neutro Abs: 4.3 10*3/uL (ref 1.7–7.7)
Neutrophils Relative %: 89 %
Platelet Count: 144 10*3/uL — ABNORMAL LOW (ref 150–400)
RBC: 3.1 MIL/uL — ABNORMAL LOW (ref 4.22–5.81)
RDW: 15 % (ref 11.5–15.5)
WBC Count: 4.9 10*3/uL (ref 4.0–10.5)
nRBC: 0 % (ref 0.0–0.2)

## 2021-01-20 MED ORDER — PALONOSETRON HCL INJECTION 0.25 MG/5ML
0.2500 mg | Freq: Once | INTRAVENOUS | Status: AC
  Administered 2021-01-20: 0.25 mg via INTRAVENOUS

## 2021-01-20 MED ORDER — DIPHENHYDRAMINE HCL 50 MG/ML IJ SOLN
50.0000 mg | Freq: Once | INTRAMUSCULAR | Status: AC
  Administered 2021-01-20: 50 mg via INTRAVENOUS

## 2021-01-20 MED ORDER — SODIUM CHLORIDE 0.9 % IV SOLN
105.6000 mg | Freq: Once | INTRAVENOUS | Status: AC
  Administered 2021-01-20: 110 mg via INTRAVENOUS
  Filled 2021-01-20: qty 11

## 2021-01-20 MED ORDER — PALONOSETRON HCL INJECTION 0.25 MG/5ML
INTRAVENOUS | Status: AC
  Filled 2021-01-20: qty 5

## 2021-01-20 MED ORDER — FAMOTIDINE IN NACL 20-0.9 MG/50ML-% IV SOLN
20.0000 mg | Freq: Once | INTRAVENOUS | Status: AC
  Administered 2021-01-20: 20 mg via INTRAVENOUS

## 2021-01-20 MED ORDER — FAMOTIDINE IN NACL 20-0.9 MG/50ML-% IV SOLN
INTRAVENOUS | Status: AC
  Filled 2021-01-20: qty 50

## 2021-01-20 MED ORDER — SODIUM CHLORIDE 0.9 % IV SOLN
20.0000 mg | Freq: Once | INTRAVENOUS | Status: AC
  Administered 2021-01-20: 20 mg via INTRAVENOUS
  Filled 2021-01-20: qty 20

## 2021-01-20 MED ORDER — SODIUM CHLORIDE 0.9 % IV SOLN
Freq: Once | INTRAVENOUS | Status: AC
  Filled 2021-01-20: qty 250

## 2021-01-20 MED ORDER — DIPHENHYDRAMINE HCL 50 MG/ML IJ SOLN
INTRAMUSCULAR | Status: AC
  Filled 2021-01-20: qty 1

## 2021-01-20 MED ORDER — SODIUM CHLORIDE 0.9 % IV SOLN
45.0000 mg/m2 | Freq: Once | INTRAVENOUS | Status: AC
  Administered 2021-01-20: 84 mg via INTRAVENOUS
  Filled 2021-01-20: qty 14

## 2021-01-20 NOTE — Patient Instructions (Signed)
[facility] ONCOLOGY  Discharge Instructions: Thank you for choosing [facility] to provide your oncology and hematology care.   If you have a lab appointment with the [facility], please go directly to the [facility] and check in at the registration area.  We strive to give you quality time with your provider. You may need to reschedule your appointment if you arrive late (15 or more minutes).  Arriving late affects you and other patients whose appointments are after yours.  Also, if you miss three or more appointments without notifying the office, you may be dismissed from the clinic at the provider's discretion.      For prescription refill requests, have your pharmacy contact our office and allow 72 hours for refills to be completed.    Today you received the following chemotherapy and/or immunotherapy agents: paclitaxel and carboplatin    To help prevent nausea and vomiting after your treatment, we encourage you to take your nausea medication as directed.  BELOW ARE SYMPTOMS THAT SHOULD BE REPORTED IMMEDIATELY: . *FEVER GREATER THAN 100.4 F (38 C) OR HIGHER . *CHILLS OR SWEATING . *NAUSEA AND VOMITING THAT IS NOT CONTROLLED WITH YOUR NAUSEA MEDICATION . *UNUSUAL SHORTNESS OF BREATH . *UNUSUAL BRUISING OR BLEEDING . *URINARY PROBLEMS (pain or burning when urinating, or frequent urination) . *BOWEL PROBLEMS (unusual diarrhea, constipation, pain near the anus) . TENDERNESS IN MOUTH AND THROAT WITH OR WITHOUT PRESENCE OF ULCERS (sore throat, sores in mouth, or a toothache) . UNUSUAL RASH, SWELLING OR PAIN  . UNUSUAL VAGINAL DISCHARGE OR ITCHING   Items with * indicate a potential emergency and should be followed up as soon as possible or go to the Emergency Department if any problems should occur.  Please show the CHEMOTHERAPY ALERT CARD or IMMUNOTHERAPY ALERT CARD at check-in to the Emergency Department and triage nurse.  Should you have  questions after your visit or need to cancel or reschedule your appointment, please contact [facility]  Dept: [phone]  and follow the prompts.  Office hours are 8:00 a.m. to 4:30 p.m. Monday - Friday. Please note that voicemails left after 4:00 p.m. may not be returned until the following business day.  We are closed weekends and major holidays. You have access to a nurse at all times for urgent questions. Please call the main number to the clinic Dept: [phone] and follow the prompts.   For any non-urgent questions, you may also contact your provider using MyChart. We now offer e-Visits for anyone 82 and older to request care online for non-urgent symptoms. For details visit [email or web address].   Also download the MyChart app! Go to the app store, search "MyChart", open the app, select [facility], and log in with your MyChart username and password.  Due to Covid, a mask is required upon entering the hospital/clinic. If you do not have a mask, one will be given to you upon arrival. For doctor visits, patients may have 1 support person aged 71 or older with them. For treatment visits, patients cannot have anyone with them due to current Covid guidelines and our immunocompromised population.

## 2021-01-20 NOTE — Progress Notes (Signed)
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  PDL1: 90%  PRIOR THERAPY: None  CURRENT THERAPY: Weekly concurrent chemoradiation with carboplatin for an AUC of 2 and paclitaxel 45 mg/m.  First dose on 12/30/2020.  Status post 3 cycles.  INTERVAL HISTORY: [name] 87 y.o. male returns to the clinic today for follow-up visit accompanied by his son.  The patient is feeling fine today with no concerning complaints.  The patient is feeling fine today with no concerning complaints except for cough.  He denied having any chest pain, shortness of breath or hemoptysis.  He denied having any recent weight loss or night sweats.  He has no nausea, vomiting, diarrhea or constipation.  He has no headache or visual changes.  He is here today for evaluation before starting cycle #4 of his treatment.  MEDICAL HISTORY: Past Medical History:  Diagnosis Date  . Arthritis   . Hypertension   . Hyperthyroidism     ALLERGIES:  has No Known Allergies.  MEDICATIONS:  Current Outpatient Medications  Medication Sig Dispense Refill  . gabapentin (NEURONTIN) 250 MG/5ML solution Take 3 mLs (150 mg total) by mouth at bedtime. 90 mL 2  . labetalol (NORMODYNE) 200 MG tablet Take 200 mg by mouth 2 (two) times daily.    Marland Kitchen levothyroxine (SYNTHROID) 150 MCG tablet Take 150 mcg by mouth daily before breakfast.    . naproxen (NAPROSYN) 500 MG tablet Take 500 mg by mouth 2 (two) times daily as needed for moderate pain. (Patient not taking: Reported on 12/23/2020)    . Olmesartan-Amlodipine-HCTZ  40-10-12.5 MG TABS Take 1 tablet by mouth daily.    Marland Kitchen oxyCODONE-acetaminophen (PERCOCET/ROXICET) 5-325 MG tablet Take 1 tablet by mouth every 8 (eight) hours as needed for severe pain. 30 tablet 0  . prochlorperazine (COMPAZINE) 10 MG tablet Take 1 tablet (10 mg total) by mouth every 6 (six) hours as needed for nausea or vomiting. 30 tablet 0  . tadalafil (CIALIS) 5 MG tablet Take 5 mg by mouth daily.    . tamsulosin (FLOMAX) 0.4 MG CAPS capsule Take 0.4 mg by mouth daily.     No current facility-administered medications for this visit.    SURGICAL HISTORY:  Past Surgical History:  Procedure Laterality Date  . COLONOSCOPY N/A 09/02/2016   Procedure: COLONOSCOPY;  Surgeon: Rogene Houston, MD;  Location: [facility];  Service: Endoscopy;  Laterality: N/A;  830  . NO PAST SURGERIES    . POLYPECTOMY  09/02/2016   Procedure: POLYPECTOMY;  Surgeon: Rogene Houston, MD;  Location: [facility];  Service: Endoscopy;;    REVIEW OF SYSTEMS:  A comprehensive review of systems was negative except for: Constitutional: positive for fatigue Respiratory: positive for cough   PHYSICAL EXAMINATION: General appearance: alert, cooperative, fatigued and no distress Head: Normocephalic, without obvious abnormality, atraumatic Neck: no adenopathy, no JVD, supple, symmetrical, trachea midline and thyroid not enlarged, symmetric, no tenderness/mass/nodules Lymph nodes: Cervical, supraclavicular, and axillary nodes normal. Resp: clear to auscultation bilaterally Back: symmetric, no curvature. ROM normal. No CVA tenderness. Cardio: regular rate and rhythm, S1, S2 normal, no murmur,  click, rub or gallop GI: soft, non-tender; bowel sounds normal; no masses,  no organomegaly Extremities: extremities normal, atraumatic, no cyanosis or edema  ECOG PERFORMANCE STATUS: 1 - Symptomatic but completely ambulatory  Blood pressure (!) 122/57, pulse 76, temperature 98.2 F (36.8 C), temperature source Tympanic, resp.  rate 19, height _0  (1.88 m), weight 135 lb 8 oz (61.5 kg), SpO2 94 %.  LABORATORY DATA: Lab Results  Component Value Date   WBC 4.9 01/20/2021   HGB 8.7 (L) 01/20/2021   HCT 27.6 (L) 01/20/2021   MCV 89.0 01/20/2021   PLT 144 (L) 01/20/2021      Chemistry      Component Value Date/Time   NA 135 01/13/2021 1120   K 5.3 (H) 01/13/2021 1120   CL 99 01/13/2021 1120   CO2 26 01/13/2021 1120   BUN 36 (H) 01/13/2021 1120   CREATININE 1.78 (H) 01/13/2021 1120      Component Value Date/Time   CALCIUM 8.7 (L) 01/13/2021 1120   ALKPHOS 60 01/13/2021 1120   AST 7 (L) 01/13/2021 1120   ALT 7 01/13/2021 1120   BILITOT 0.4 01/13/2021 1120       RADIOGRAPHIC STUDIES: MR BRAIN W WO CONTRAST  Result Date: 01/03/2021 CLINICAL DATA:  Non-small cell lung cancer staging EXAM: MRI HEAD WITHOUT AND WITH CONTRAST TECHNIQUE: Multiplanar, multiecho pulse sequences of the brain and surrounding structures were obtained without and with intravenous contrast. CONTRAST:  39m GADAVIST GADOBUTROL 1 MMOL/ML IV SOLN COMPARISON:  None. FINDINGS: Brain: No swelling or enhancement to suggest metastatic disease. Prominence of dural thickness but smooth and overall benign appearing. No brain sagging. FLAIR hyperintensity that is confluent in the deep cerebral white matter, consistent with chronic small vessel ischemia. No infarct, hemorrhage, or hydrocephalus. Vascular: Normal flow voids and vascular enhancements. Skull and upper cervical spine: Decreased T1 signal throughout the visualized cervical spine with heterogeneity of marrow signal at the clivus. No hematogenous osseous metastatic disease by recent PET-CT. Findings may relate to anemia and treatment. Sinuses/Orbits: Negative IMPRESSION: 1. Negative for intracranial metastatic disease. 2. Chronic small vessel ischemia. Electronically Signed   By: JMonte FantasiaM.D.   On: 01/03/2021 07:51    ASSESSMENT AND PLAN: This is a very pleasant 87years old  African-American male recently diagnosed with a stage IVa (T4, N3, M1 B) non-small cell lung cancer, adenosquamous carcinoma in March 2022 and presented with large mass involving the lateral right upper lobe and right chest wall soft tissue in addition to right hilar, mediastinal and right subpectoral and supraclavicular lymphadenopathy with PD-L1 expression of 90%.  The patient is currently undergoing a course of concurrent chemoradiation with weekly carboplatin for AUC of 2 and paclitaxel 45 Mg/M2 status post 3 cycles.  He has been tolerating his treatment well with no concerning adverse effects. I recommended for the patient to proceed with cycle #4 today as planned. I will see him back for follow-up visit in 2 weeks for evaluation before starting cycle #6. For the cough, he will use Delsym over-the-counter for now. The patient was advised to call immediately if he has any other concerning symptoms in the interval. The patient voices understanding of current disease status and treatment options and is in agreement with the current care plan.  All questions were answered. The patient knows to call the clinic with any problems, questions or concerns. We can certainly see the patient much sooner if necessary.   Disclaimer: This note was dictated with voice recognition software.  Similar sounding words can inadvertently be transcribed and may not be corrected upon review.

## 2021-01-20 NOTE — Progress Notes (Signed)
Per Dr. Julien Nordmann, ok to treat with Scr 2.06.

## 2021-01-21 ENCOUNTER — Ambulatory Visit
Admission: RE | Admit: 2021-01-21 | Discharge: 2021-01-21 | Disposition: A | Discharge status: Home / Self Care | Payer: Medicare HMO | Source: Ambulatory Visit | Attending: Radiation Oncology | Admitting: Radiation Oncology

## 2021-01-21 DIAGNOSIS — Z510 Encounter for antineoplastic radiation therapy: Secondary | ICD-10-CM | POA: Diagnosis not present

## 2021-01-21 DIAGNOSIS — C3411 Malignant neoplasm of upper lobe, right bronchus or lung: Secondary | ICD-10-CM | POA: Diagnosis not present

## 2021-01-22 ENCOUNTER — Ambulatory Visit
Admission: RE | Admit: 2021-01-22 | Discharge: 2021-01-22 | Disposition: A | Discharge status: Home / Self Care | Payer: Medicare HMO | Source: Ambulatory Visit | Attending: Radiation Oncology | Admitting: Radiation Oncology

## 2021-01-22 ENCOUNTER — Other Ambulatory Visit: Payer: Self-pay

## 2021-01-22 DIAGNOSIS — Z510 Encounter for antineoplastic radiation therapy: Secondary | ICD-10-CM | POA: Diagnosis not present

## 2021-01-22 DIAGNOSIS — C3411 Malignant neoplasm of upper lobe, right bronchus or lung: Secondary | ICD-10-CM | POA: Diagnosis not present

## 2021-01-23 ENCOUNTER — Ambulatory Visit
Admission: RE | Admit: 2021-01-23 | Discharge: 2021-01-23 | Disposition: A | Discharge status: Home / Self Care | Payer: Medicare HMO | Source: Ambulatory Visit | Attending: Radiation Oncology | Admitting: Radiation Oncology

## 2021-01-23 DIAGNOSIS — Z510 Encounter for antineoplastic radiation therapy: Secondary | ICD-10-CM | POA: Diagnosis not present

## 2021-01-23 DIAGNOSIS — C3411 Malignant neoplasm of upper lobe, right bronchus or lung: Secondary | ICD-10-CM | POA: Diagnosis not present

## 2021-01-24 ENCOUNTER — Ambulatory Visit
Admission: RE | Admit: 2021-01-24 | Discharge: 2021-01-24 | Disposition: A | Discharge status: Home / Self Care | Payer: Medicare HMO | Source: Ambulatory Visit | Attending: Radiation Oncology | Admitting: Radiation Oncology

## 2021-01-24 ENCOUNTER — Other Ambulatory Visit: Payer: Self-pay

## 2021-01-24 DIAGNOSIS — Z510 Encounter for antineoplastic radiation therapy: Secondary | ICD-10-CM | POA: Diagnosis not present

## 2021-01-24 DIAGNOSIS — C3411 Malignant neoplasm of upper lobe, right bronchus or lung: Secondary | ICD-10-CM | POA: Diagnosis not present

## 2021-01-27 ENCOUNTER — Inpatient Hospital Stay: Payer: Medicare HMO | Admitting: Nutrition

## 2021-01-27 ENCOUNTER — Ambulatory Visit
Admission: RE | Admit: 2021-01-27 | Discharge: 2021-01-27 | Disposition: A | Discharge status: Home / Self Care | Payer: Medicare HMO | Source: Ambulatory Visit | Attending: Radiation Oncology | Admitting: Radiation Oncology

## 2021-01-27 ENCOUNTER — Ambulatory Visit: Payer: Medicare HMO | Admitting: Internal Medicine

## 2021-01-27 ENCOUNTER — Other Ambulatory Visit: Payer: Medicare HMO

## 2021-01-27 ENCOUNTER — Other Ambulatory Visit: Payer: Self-pay

## 2021-01-27 ENCOUNTER — Inpatient Hospital Stay: Payer: Medicare HMO | Attending: Internal Medicine

## 2021-01-27 ENCOUNTER — Inpatient Hospital Stay: Payer: Medicare HMO

## 2021-01-27 VITALS — BP 146/64 | HR 80 | Temp 97.9°F | Resp 18 | Wt 134.0 lb

## 2021-01-27 DIAGNOSIS — Z5111 Encounter for antineoplastic chemotherapy: Secondary | ICD-10-CM | POA: Diagnosis not present

## 2021-01-27 DIAGNOSIS — C3411 Malignant neoplasm of upper lobe, right bronchus or lung: Secondary | ICD-10-CM | POA: Diagnosis not present

## 2021-01-27 DIAGNOSIS — R197 Diarrhea, unspecified: Secondary | ICD-10-CM

## 2021-01-27 DIAGNOSIS — C7989 Secondary malignant neoplasm of other specified sites: Secondary | ICD-10-CM | POA: Insufficient documentation

## 2021-01-27 DIAGNOSIS — D6481 Anemia due to antineoplastic chemotherapy: Secondary | ICD-10-CM | POA: Diagnosis not present

## 2021-01-27 DIAGNOSIS — D649 Anemia, unspecified: Secondary | ICD-10-CM

## 2021-01-27 LAB — CBC WITH DIFFERENTIAL (CANCER CENTER ONLY)
Abs Immature Granulocytes: 0.01 10*3/uL (ref 0.00–0.07)
Basophils Absolute: 0 10*3/uL (ref 0.0–0.1)
Basophils Relative: 0 %
Eosinophils Absolute: 0 10*3/uL (ref 0.0–0.5)
Eosinophils Relative: 2 %
HCT: 26.3 % — ABNORMAL LOW (ref 39.0–52.0)
Hemoglobin: 8.2 g/dL — ABNORMAL LOW (ref 13.0–17.0)
Immature Granulocytes: 0 %
Lymphocytes Relative: 9 %
Lymphs Abs: 0.2 10*3/uL — ABNORMAL LOW (ref 0.7–4.0)
MCH: 27.4 pg (ref 26.0–34.0)
MCHC: 31.2 g/dL (ref 30.0–36.0)
MCV: 88 fL (ref 80.0–100.0)
Monocytes Absolute: 0.1 10*3/uL (ref 0.1–1.0)
Monocytes Relative: 4 %
Neutro Abs: 1.9 10*3/uL (ref 1.7–7.7)
Neutrophils Relative %: 85 %
Platelet Count: 96 10*3/uL — ABNORMAL LOW (ref 150–400)
RBC: 2.99 MIL/uL — ABNORMAL LOW (ref 4.22–5.81)
RDW: 15.4 % (ref 11.5–15.5)
WBC Count: 2.2 10*3/uL — ABNORMAL LOW (ref 4.0–10.5)
nRBC: 0 % (ref 0.0–0.2)

## 2021-01-27 LAB — CMP (CANCER CENTER ONLY)
ALT: 8 U/L (ref 0–44)
AST: 7 U/L — ABNORMAL LOW (ref 15–41)
Albumin: 2.3 g/dL — ABNORMAL LOW (ref 3.5–5.0)
Alkaline Phosphatase: 54 U/L (ref 38–126)
Anion gap: 9 (ref 5–15)
BUN: 38 mg/dL — ABNORMAL HIGH (ref 8–23)
CO2: 25 mmol/L (ref 22–32)
Calcium: 8.2 mg/dL — ABNORMAL LOW (ref 8.9–10.3)
Chloride: 104 mmol/L (ref 98–111)
Creatinine: 1.91 mg/dL — ABNORMAL HIGH (ref 0.61–1.24)
GFR, Estimated: 34 mL/min — ABNORMAL LOW (ref >60)
Glucose, Bld: 101 mg/dL — ABNORMAL HIGH (ref 70–99)
Potassium: 4.1 mmol/L (ref 3.5–5.1)
Sodium: 138 mmol/L (ref 135–145)
Total Bilirubin: 0.4 mg/dL (ref 0.3–1.2)
Total Protein: 6.1 g/dL — ABNORMAL LOW (ref 6.5–8.1)

## 2021-01-27 LAB — SAMPLE TO BLOOD BANK

## 2021-01-27 MED ORDER — PALONOSETRON HCL INJECTION 0.25 MG/5ML
INTRAVENOUS | Status: AC
  Filled 2021-01-27: qty 5

## 2021-01-27 MED ORDER — FAMOTIDINE IN NACL 20-0.9 MG/50ML-% IV SOLN
20.0000 mg | Freq: Once | INTRAVENOUS | Status: AC
Start: 2021-01-27 — End: 2021-01-27
  Administered 2021-01-27: 20 mg via INTRAVENOUS

## 2021-01-27 MED ORDER — SODIUM CHLORIDE 0.9 % IV SOLN
105.6000 mg | Freq: Once | INTRAVENOUS | Status: AC
  Administered 2021-01-27: 110 mg via INTRAVENOUS
  Filled 2021-01-27: qty 11

## 2021-01-27 MED ORDER — DIPHENOXYLATE-ATROPINE 2.5-0.025 MG PO TABS
ORAL_TABLET | ORAL | Status: AC
  Filled 2021-01-27: qty 1

## 2021-01-27 MED ORDER — DIPHENOXYLATE-ATROPINE 2.5-0.025 MG PO TABS
1.0000 | ORAL_TABLET | Freq: Once | ORAL | Status: AC
Start: 2021-01-27 — End: 2021-01-27
  Administered 2021-01-27: 1 via ORAL

## 2021-01-27 MED ORDER — DIPHENHYDRAMINE HCL 50 MG/ML IJ SOLN
50.0000 mg | Freq: Once | INTRAMUSCULAR | Status: AC
  Administered 2021-01-27: 50 mg via INTRAVENOUS

## 2021-01-27 MED ORDER — SODIUM CHLORIDE 0.9 % IV SOLN
20.0000 mg | Freq: Once | INTRAVENOUS | Status: AC
  Administered 2021-01-27: 20 mg via INTRAVENOUS
  Filled 2021-01-27: qty 20

## 2021-01-27 MED ORDER — DIPHENHYDRAMINE HCL 50 MG/ML IJ SOLN
INTRAMUSCULAR | Status: AC
  Filled 2021-01-27: qty 1

## 2021-01-27 MED ORDER — SODIUM CHLORIDE 0.9 % IV SOLN
INTRAVENOUS | Status: AC
  Filled 2021-01-27: qty 250

## 2021-01-27 MED ORDER — FAMOTIDINE IN NACL 20-0.9 MG/50ML-% IV SOLN
INTRAVENOUS | Status: AC
  Filled 2021-01-27: qty 50

## 2021-01-27 MED ORDER — SODIUM CHLORIDE 0.9% FLUSH
10.0000 mL | INTRAVENOUS | Status: DC | PRN
  Filled 2021-01-27: qty 10

## 2021-01-27 MED ORDER — SODIUM CHLORIDE 0.9 % IV SOLN
45.0000 mg/m2 | Freq: Once | INTRAVENOUS | Status: AC
  Administered 2021-01-27: 84 mg via INTRAVENOUS
  Filled 2021-01-27: qty 14

## 2021-01-27 MED ORDER — SODIUM CHLORIDE 0.9 % IV SOLN
INTRAVENOUS | Status: DC
  Filled 2021-01-27: qty 250

## 2021-01-27 MED ORDER — HEPARIN SOD (PORK) LOCK FLUSH 100 UNIT/ML IV SOLN
500.0000 [IU] | Freq: Once | INTRAVENOUS | Status: DC | PRN
  Filled 2021-01-27: qty 5

## 2021-01-27 MED ORDER — PALONOSETRON HCL INJECTION 0.25 MG/5ML
0.2500 mg | Freq: Once | INTRAVENOUS | Status: AC
Start: 2021-01-27 — End: 2021-01-27
  Administered 2021-01-27: 0.25 mg via INTRAVENOUS

## 2021-01-27 MED ORDER — SODIUM CHLORIDE 0.9 % IV SOLN
Freq: Once | INTRAVENOUS | Status: DC
  Filled 2021-01-27: qty 250

## 2021-01-27 NOTE — Progress Notes (Signed)
Per Dr. Mohamed OK to treat with today's labs 

## 2021-01-27 NOTE — Progress Notes (Signed)
Nutrition follow-up completed with patient during infusion for lung cancer. Weight decreased and documented as 134.8 pounds April 29 down from 149.2 pounds February 2022.  This is a 10% weight loss in less than 3 months which is significant. Patient states he is having increased loose stools, 3-4 daily.  He has reported this to RN and she provided him with Lomotil today.  She plans on doing additional education with his son. Patient is unable to provide detailed nutrition information. He does report that he drinks oral nutrition supplements but cannot specify how many.  He would like to have coupons today.  Nutrition diagnosis: Unintended weight loss continues.  Intervention: Educated patient on importance of increasing small frequent meals and snacks. Recommended 4 cartons of oral nutrition supplements daily.  Provided coupons. Brief strategies provided to help manage diet diarrhea.  Monitoring, evaluation, goals: Patient will tolerate adequate calories and protein to minimize further weight loss.  Next visit: Monday, May 16 during infusion.  **Disclaimer: This note was dictated with voice recognition software. Similar sounding words can inadvertently be transcribed and this note may contain transcription errors which may not have been corrected upon publication of note.**

## 2021-01-27 NOTE — Patient Instructions (Signed)
[facility]COL[facility]  Discharge Instructions: Thank you for choosing [facility] to provide your oncology and hematology care.   If you have a lab appointment with the [facility], please go directly to the [facility] and check in at the registration area.  We strive to give you quality time with your provider. You may need to reschedule your appointment if you arrive late (15 or more minutes).  Arriving late affects you and other patients whose appointments are after yours.  Also, if you miss three or more appointments without notifying the office, you may be dismissed from the clinic at the provider's discretion.      For prescription refill requests, have your pharmacy contact our office and allow 72 hours for refills to be completed.    Today you received the following chemotherapy and/or immunotherapy agents: paclitaxel and carboplatin    To help prevent nausea and vomiting after your treatment, we encourage you to take your nausea medication as directed.  BELOW ARE SYMPTOMS THAT SHOULD BE REPORTED IMMEDIATELY: . *FEVER GREATER THAN 100.4 F (38 C) OR HIGHER . *CHILLS OR SWEATING . *NAUSEA AND VOMITING THAT IS NOT CONTROLLED WITH YOUR NAUSEA MEDICATION . *UNUSUAL SHORTNESS OF BREATH . *UNUSUAL BRUISING OR BLEEDING . *URINARY PROBLEMS (pain or burning when urinating, or frequent urination) . *BOWEL PROBLEMS (unusual diarrhea, constipation, pain near the anus) . TENDERNESS IN MOUTH AND THROAT WITH OR WITHOUT PRESENCE OF ULCERS (sore throat, sores in mouth, or a toothache) . UNUSUAL RASH, SWELLING OR PAIN  . UNUSUAL VAGINAL DISCHARGE OR ITCHING   Items with * indicate a potential emergency and should be followed up as soon as possible or go to the Emergency Department if any problems should occur.  Please show the CHEMOTHERAPY ALERT CARD or IMMUNOTHERAPY ALERT CARD at check-in to the Emergency Department and triage nurse.  Should you have  questions after your visit or need to cancel or reschedule your appointment, please contact [facility]  Dept: [phone]  and follow the prompts.  Office hours are 8:00 a.m. to 4:30 p.m. Monday - Friday. Please note that voicemails left after 4:00 p.m. may not be returned until the following business day.  We are closed weekends and major holidays. You have access to a nurse at all times for urgent questions. Please call the main number to the clinic Dept: [phone] and follow the prompts.   For any non-urgent questions, you may also contact your provider using MyChart. We now offer e-Visits for anyone 45 and older to request care online for non-urgent symptoms. For details visit [email or web address].   Also download the MyChart app! Go to the app store, search "MyChart", open the app, select [facility], and log in with your MyChart username and password.  Due to Covid, a mask is required upon entering the hospital/clinic. If you do not have a mask, one will be given to you upon arrival. For doctor visits, patients may have 1 support person aged 67 or older with them. For treatment visits, patients cannot have anyone with them due to current Covid guidelines and our immunocompromised population.

## 2021-01-28 ENCOUNTER — Ambulatory Visit
Admission: RE | Admit: 2021-01-28 | Discharge: 2021-01-28 | Disposition: A | Discharge status: Home / Self Care | Payer: Medicare HMO | Source: Ambulatory Visit | Attending: Radiation Oncology | Admitting: Radiation Oncology

## 2021-01-28 ENCOUNTER — Other Ambulatory Visit: Payer: Self-pay

## 2021-01-28 DIAGNOSIS — C3411 Malignant neoplasm of upper lobe, right bronchus or lung: Secondary | ICD-10-CM | POA: Diagnosis not present

## 2021-01-29 ENCOUNTER — Ambulatory Visit
Admission: RE | Admit: 2021-01-29 | Discharge: 2021-01-29 | Disposition: A | Discharge status: Home / Self Care | Payer: Medicare HMO | Source: Ambulatory Visit | Attending: Radiation Oncology | Admitting: Radiation Oncology

## 2021-01-29 DIAGNOSIS — C3411 Malignant neoplasm of upper lobe, right bronchus or lung: Secondary | ICD-10-CM | POA: Diagnosis not present

## 2021-01-30 ENCOUNTER — Other Ambulatory Visit: Payer: Self-pay

## 2021-01-30 ENCOUNTER — Ambulatory Visit
Admission: RE | Admit: 2021-01-30 | Discharge: 2021-01-30 | Disposition: A | Discharge status: Home / Self Care | Payer: Medicare HMO | Source: Ambulatory Visit | Attending: Radiation Oncology | Admitting: Radiation Oncology

## 2021-01-30 DIAGNOSIS — C3411 Malignant neoplasm of upper lobe, right bronchus or lung: Secondary | ICD-10-CM | POA: Diagnosis not present

## 2021-01-31 ENCOUNTER — Ambulatory Visit
Admission: RE | Admit: 2021-01-31 | Discharge: 2021-01-31 | Disposition: A | Discharge status: Home / Self Care | Payer: Medicare HMO | Source: Ambulatory Visit | Attending: Radiation Oncology | Admitting: Radiation Oncology

## 2021-01-31 DIAGNOSIS — C3411 Malignant neoplasm of upper lobe, right bronchus or lung: Secondary | ICD-10-CM | POA: Diagnosis not present

## 2021-02-03 ENCOUNTER — Inpatient Hospital Stay: Payer: Medicare HMO

## 2021-02-03 ENCOUNTER — Other Ambulatory Visit: Payer: Self-pay

## 2021-02-03 ENCOUNTER — Other Ambulatory Visit: Payer: Self-pay | Admitting: Physician Assistant

## 2021-02-03 ENCOUNTER — Ambulatory Visit
Admission: RE | Admit: 2021-02-03 | Discharge: 2021-02-03 | Disposition: A | Discharge status: Home / Self Care | Payer: Medicare HMO | Source: Ambulatory Visit | Attending: Radiation Oncology | Admitting: Radiation Oncology

## 2021-02-03 VITALS — BP 144/70 | HR 78 | Temp 98.2°F | Resp 18

## 2021-02-03 DIAGNOSIS — C3411 Malignant neoplasm of upper lobe, right bronchus or lung: Secondary | ICD-10-CM

## 2021-02-03 DIAGNOSIS — Z5111 Encounter for antineoplastic chemotherapy: Secondary | ICD-10-CM | POA: Diagnosis not present

## 2021-02-03 DIAGNOSIS — C7989 Secondary malignant neoplasm of other specified sites: Secondary | ICD-10-CM | POA: Diagnosis not present

## 2021-02-03 DIAGNOSIS — D6481 Anemia due to antineoplastic chemotherapy: Secondary | ICD-10-CM | POA: Diagnosis not present

## 2021-02-03 DIAGNOSIS — D649 Anemia, unspecified: Secondary | ICD-10-CM

## 2021-02-03 LAB — CMP (CANCER CENTER ONLY)
ALT: 11 U/L (ref 0–44)
AST: 8 U/L — ABNORMAL LOW (ref 15–41)
Albumin: 2.5 g/dL — ABNORMAL LOW (ref 3.5–5.0)
Alkaline Phosphatase: 61 U/L (ref 38–126)
Anion gap: 11 (ref 5–15)
BUN: 35 mg/dL — ABNORMAL HIGH (ref 8–23)
CO2: 25 mmol/L (ref 22–32)
Calcium: 8.4 mg/dL — ABNORMAL LOW (ref 8.9–10.3)
Chloride: 101 mmol/L (ref 98–111)
Creatinine: 1.88 mg/dL — ABNORMAL HIGH (ref 0.61–1.24)
GFR, Estimated: 35 mL/min — ABNORMAL LOW (ref >60)
Glucose, Bld: 123 mg/dL — ABNORMAL HIGH (ref 70–99)
Potassium: 4.6 mmol/L (ref 3.5–5.1)
Sodium: 137 mmol/L (ref 135–145)
Total Bilirubin: 0.5 mg/dL (ref 0.3–1.2)
Total Protein: 6.6 g/dL (ref 6.5–8.1)

## 2021-02-03 LAB — CBC WITH DIFFERENTIAL (CANCER CENTER ONLY)
Abs Immature Granulocytes: 0.02 10*3/uL (ref 0.00–0.07)
Basophils Absolute: 0 10*3/uL (ref 0.0–0.1)
Basophils Relative: 1 %
Eosinophils Absolute: 0 10*3/uL (ref 0.0–0.5)
Eosinophils Relative: 1 %
HCT: 26.8 % — ABNORMAL LOW (ref 39.0–52.0)
Hemoglobin: 8.3 g/dL — ABNORMAL LOW (ref 13.0–17.0)
Immature Granulocytes: 1 %
Lymphocytes Relative: 10 %
Lymphs Abs: 0.2 10*3/uL — ABNORMAL LOW (ref 0.7–4.0)
MCH: 27.7 pg (ref 26.0–34.0)
MCHC: 31 g/dL (ref 30.0–36.0)
MCV: 89.3 fL (ref 80.0–100.0)
Monocytes Absolute: 0.1 10*3/uL (ref 0.1–1.0)
Monocytes Relative: 6 %
Neutro Abs: 1.5 10*3/uL — ABNORMAL LOW (ref 1.7–7.7)
Neutrophils Relative %: 81 %
Platelet Count: 97 10*3/uL — ABNORMAL LOW (ref 150–400)
RBC: 3 MIL/uL — ABNORMAL LOW (ref 4.22–5.81)
RDW: 15.9 % — ABNORMAL HIGH (ref 11.5–15.5)
WBC Count: 1.8 10*3/uL — ABNORMAL LOW (ref 4.0–10.5)
nRBC: 0 % (ref 0.0–0.2)

## 2021-02-03 MED ORDER — DIPHENHYDRAMINE HCL 50 MG/ML IJ SOLN
INTRAMUSCULAR | Status: AC
  Filled 2021-02-03: qty 1

## 2021-02-03 MED ORDER — SODIUM CHLORIDE 0.9 % IV SOLN
20.0000 mg | Freq: Once | INTRAVENOUS | Status: DC
  Filled 2021-02-03: qty 2

## 2021-02-03 MED ORDER — PALONOSETRON HCL INJECTION 0.25 MG/5ML
INTRAVENOUS | Status: AC
  Filled 2021-02-03: qty 5

## 2021-02-03 MED ORDER — SODIUM CHLORIDE 0.9 % IV SOLN
20.0000 mg | Freq: Once | INTRAVENOUS | Status: AC
  Administered 2021-02-03: 20 mg via INTRAVENOUS
  Filled 2021-02-03: qty 20

## 2021-02-03 MED ORDER — SODIUM CHLORIDE 0.9 % IV SOLN
Freq: Once | INTRAVENOUS | Status: AC
Start: 2021-02-03 — End: 2021-02-03
  Filled 2021-02-03: qty 250

## 2021-02-03 MED ORDER — PALONOSETRON HCL INJECTION 0.25 MG/5ML
0.2500 mg | Freq: Once | INTRAVENOUS | Status: AC
  Administered 2021-02-03: 0.25 mg via INTRAVENOUS

## 2021-02-03 MED ORDER — SODIUM CHLORIDE 0.9 % IV SOLN
100.0000 mg | Freq: Once | INTRAVENOUS | Status: AC
  Administered 2021-02-03: 100 mg via INTRAVENOUS
  Filled 2021-02-03: qty 10

## 2021-02-03 MED ORDER — FAMOTIDINE 20 MG IN NS 100 ML IVPB
INTRAVENOUS | Status: AC
  Filled 2021-02-03: qty 100

## 2021-02-03 MED ORDER — PACLITAXEL CHEMO INJECTION 300 MG/50ML
45.0000 mg/m2 | Freq: Once | INTRAVENOUS | Status: AC
  Administered 2021-02-03: 84 mg via INTRAVENOUS
  Filled 2021-02-03: qty 14

## 2021-02-03 MED ORDER — FAMOTIDINE 20 MG IN NS 100 ML IVPB
20.0000 mg | Freq: Once | INTRAVENOUS | Status: AC
Start: 2021-02-03 — End: 2021-02-03
  Administered 2021-02-03: 20 mg via INTRAVENOUS

## 2021-02-03 MED ORDER — DIPHENHYDRAMINE HCL 50 MG/ML IJ SOLN
50.0000 mg | Freq: Once | INTRAMUSCULAR | Status: AC
Start: 2021-02-03 — End: 2021-02-03
  Administered 2021-02-03: 50 mg via INTRAVENOUS

## 2021-02-03 NOTE — Progress Notes (Signed)
Ok to treat today with current labwork per Dr. Julien Nordmann

## 2021-02-03 NOTE — Patient Instructions (Signed)
[facility]  Discharge Instructions: Thank you for choosing [facility] to provide your oncology and hematology care.   If you have a lab appointment with the [facility], please go directly to the [facility] and check in at the registration area.  We strive to give you quality time with your provider. You may need to reschedule your appointment if you arrive late (15 or more minutes).  Arriving late affects you and other patients whose appointments are after yours.  Also, if you miss three or more appointments without notifying the office, you may be dismissed from the clinic at the provider's discretion.      For prescription refill requests, have your pharmacy contact our office and allow 72 hours for refills to be completed.    Today you received the following chemotherapy and/or immunotherapy agents: paclitaxel and carboplatin    To help prevent nausea and vomiting after your treatment, we encourage you to take your nausea medication as directed.  BELOW ARE SYMPTOMS THAT SHOULD BE REPORTED IMMEDIATELY: . *FEVER GREATER THAN 100.4 F (38 C) OR HIGHER . *CHILLS OR SWEATING . *NAUSEA AND VOMITING THAT IS NOT CONTROLLED WITH YOUR NAUSEA MEDICATION . *UNUSUAL SHORTNESS OF BREATH . *UNUSUAL BRUISING OR BLEEDING . *URINARY PROBLEMS (pain or burning when urinating, or frequent urination) . *BOWEL PROBLEMS (unusual diarrhea, constipation, pain near the anus) . TENDERNESS IN MOUTH AND THROAT WITH OR WITHOUT PRESENCE OF ULCERS (sore throat, sores in mouth, or a toothache) . UNUSUAL RASH, SWELLING OR PAIN  . UNUSUAL VAGINAL DISCHARGE OR ITCHING   Items with * indicate a potential emergency and should be followed up as soon as possible or go to the Emergency Department if any problems should occur.  Please show the CHEMOTHERAPY ALERT CARD or IMMUNOTHERAPY ALERT CARD at check-in to the Emergency Department and triage nurse.  Should you have  questions after your visit or need to cancel or reschedule your appointment, please contact [facility]  Dept: [phone]  and follow the prompts.  Office hours are 8:00 a.m. to 4:30 p.m. Monday - Friday. Please note that voicemails left after 4:00 p.m. may not be returned until the following business day.  We are closed weekends and major holidays. You have access to a nurse at all times for urgent questions. Please call the main number to the clinic Dept: [phone] and follow the prompts.   For any non-urgent questions, you may also contact your provider using MyChart. We now offer e-Visits for anyone 42 and older to request care online for non-urgent symptoms. For details visit [email or web address].   Also download the MyChart app! Go to the app store, search "MyChart", open the app, select [facility], and log in with your MyChart username and password.  Due to Covid, a mask is required upon entering the hospital/clinic. If you do not have a mask, one will be given to you upon arrival. For doctor visits, patients may have 1 support person aged 69 or older with them. For treatment visits, patients cannot have anyone with them due to current Covid guidelines and our immunocompromised population.

## 2021-02-04 ENCOUNTER — Ambulatory Visit
Admission: RE | Admit: 2021-02-04 | Discharge: 2021-02-04 | Disposition: A | Discharge status: Home / Self Care | Payer: Medicare HMO | Source: Ambulatory Visit | Attending: Radiation Oncology | Admitting: Radiation Oncology

## 2021-02-04 DIAGNOSIS — C3411 Malignant neoplasm of upper lobe, right bronchus or lung: Secondary | ICD-10-CM | POA: Diagnosis not present

## 2021-02-05 ENCOUNTER — Other Ambulatory Visit: Payer: Self-pay

## 2021-02-05 ENCOUNTER — Ambulatory Visit
Admission: RE | Admit: 2021-02-05 | Discharge: 2021-02-05 | Disposition: A | Discharge status: Home / Self Care | Payer: Medicare HMO | Source: Ambulatory Visit | Attending: Radiation Oncology | Admitting: Radiation Oncology

## 2021-02-05 DIAGNOSIS — C3411 Malignant neoplasm of upper lobe, right bronchus or lung: Secondary | ICD-10-CM | POA: Diagnosis not present

## 2021-02-06 ENCOUNTER — Ambulatory Visit
Admission: RE | Admit: 2021-02-06 | Discharge: 2021-02-06 | Disposition: A | Discharge status: Home / Self Care | Payer: Medicare HMO | Source: Ambulatory Visit | Attending: Radiation Oncology | Admitting: Radiation Oncology

## 2021-02-06 DIAGNOSIS — C3411 Malignant neoplasm of upper lobe, right bronchus or lung: Secondary | ICD-10-CM | POA: Diagnosis not present

## 2021-02-07 ENCOUNTER — Ambulatory Visit
Admission: RE | Admit: 2021-02-07 | Discharge: 2021-02-07 | Disposition: A | Discharge status: Home / Self Care | Payer: Medicare HMO | Source: Ambulatory Visit | Attending: Radiation Oncology | Admitting: Radiation Oncology

## 2021-02-07 ENCOUNTER — Other Ambulatory Visit: Payer: Self-pay

## 2021-02-07 DIAGNOSIS — C3411 Malignant neoplasm of upper lobe, right bronchus or lung: Secondary | ICD-10-CM | POA: Diagnosis not present

## 2021-02-07 MED FILL — Dexamethasone Sodium Phosphate Inj 100 MG/10ML: INTRAMUSCULAR | Qty: 2 | Status: AC

## 2021-02-10 ENCOUNTER — Other Ambulatory Visit: Payer: Self-pay | Admitting: Medical Oncology

## 2021-02-10 ENCOUNTER — Ambulatory Visit
Admission: RE | Admit: 2021-02-10 | Discharge: 2021-02-10 | Disposition: A | Discharge status: Home / Self Care | Payer: Medicare HMO | Source: Ambulatory Visit | Attending: Radiation Oncology | Admitting: Radiation Oncology

## 2021-02-10 ENCOUNTER — Inpatient Hospital Stay: Payer: Medicare HMO | Admitting: Nutrition

## 2021-02-10 ENCOUNTER — Inpatient Hospital Stay: Payer: Medicare HMO

## 2021-02-10 ENCOUNTER — Inpatient Hospital Stay (HOSPITAL_BASED_OUTPATIENT_CLINIC_OR_DEPARTMENT_OTHER): Payer: Medicare HMO | Admitting: Internal Medicine

## 2021-02-10 ENCOUNTER — Encounter: Payer: Self-pay | Admitting: Internal Medicine

## 2021-02-10 ENCOUNTER — Other Ambulatory Visit: Payer: Self-pay

## 2021-02-10 VITALS — BP 132/72 | HR 97 | Temp 98.0°F | Resp 19 | Ht 74.0 in | Wt 127.9 lb

## 2021-02-10 DIAGNOSIS — C3411 Malignant neoplasm of upper lobe, right bronchus or lung: Secondary | ICD-10-CM

## 2021-02-10 DIAGNOSIS — C7989 Secondary malignant neoplasm of other specified sites: Secondary | ICD-10-CM | POA: Diagnosis not present

## 2021-02-10 DIAGNOSIS — C3490 Malignant neoplasm of unspecified part of unspecified bronchus or lung: Secondary | ICD-10-CM

## 2021-02-10 DIAGNOSIS — D638 Anemia in other chronic diseases classified elsewhere: Secondary | ICD-10-CM | POA: Diagnosis not present

## 2021-02-10 DIAGNOSIS — Z5111 Encounter for antineoplastic chemotherapy: Secondary | ICD-10-CM | POA: Diagnosis not present

## 2021-02-10 DIAGNOSIS — D649 Anemia, unspecified: Secondary | ICD-10-CM

## 2021-02-10 DIAGNOSIS — D6481 Anemia due to antineoplastic chemotherapy: Secondary | ICD-10-CM | POA: Diagnosis not present

## 2021-02-10 LAB — CBC WITH DIFFERENTIAL (CANCER CENTER ONLY)
Abs Immature Granulocytes: 0.01 10*3/uL (ref 0.00–0.07)
Basophils Absolute: 0 10*3/uL (ref 0.0–0.1)
Basophils Relative: 1 %
Eosinophils Absolute: 0 10*3/uL (ref 0.0–0.5)
Eosinophils Relative: 1 %
HCT: 24.8 % — ABNORMAL LOW (ref 39.0–52.0)
Hemoglobin: 7.8 g/dL — ABNORMAL LOW (ref 13.0–17.0)
Immature Granulocytes: 1 %
Lymphocytes Relative: 13 %
Lymphs Abs: 0.2 10*3/uL — ABNORMAL LOW (ref 0.7–4.0)
MCH: 27.9 pg (ref 26.0–34.0)
MCHC: 31.5 g/dL (ref 30.0–36.0)
MCV: 88.6 fL (ref 80.0–100.0)
Monocytes Absolute: 0.1 10*3/uL (ref 0.1–1.0)
Monocytes Relative: 9 %
Neutro Abs: 1.2 10*3/uL — ABNORMAL LOW (ref 1.7–7.7)
Neutrophils Relative %: 75 %
Platelet Count: 128 10*3/uL — ABNORMAL LOW (ref 150–400)
RBC: 2.8 MIL/uL — ABNORMAL LOW (ref 4.22–5.81)
RDW: 16.4 % — ABNORMAL HIGH (ref 11.5–15.5)
WBC Count: 1.6 10*3/uL — ABNORMAL LOW (ref 4.0–10.5)
nRBC: 0 % (ref 0.0–0.2)

## 2021-02-10 LAB — CMP (CANCER CENTER ONLY)
ALT: 20 U/L (ref 0–44)
AST: 15 U/L (ref 15–41)
Albumin: 2.4 g/dL — ABNORMAL LOW (ref 3.5–5.0)
Alkaline Phosphatase: 56 U/L (ref 38–126)
Anion gap: 7 (ref 5–15)
BUN: 36 mg/dL — ABNORMAL HIGH (ref 8–23)
CO2: 28 mmol/L (ref 22–32)
Calcium: 8.5 mg/dL — ABNORMAL LOW (ref 8.9–10.3)
Chloride: 101 mmol/L (ref 98–111)
Creatinine: 1.91 mg/dL — ABNORMAL HIGH (ref 0.61–1.24)
GFR, Estimated: 34 mL/min — ABNORMAL LOW (ref >60)
Glucose, Bld: 97 mg/dL (ref 70–99)
Potassium: 4.8 mmol/L (ref 3.5–5.1)
Sodium: 136 mmol/L (ref 135–145)
Total Bilirubin: 0.5 mg/dL (ref 0.3–1.2)
Total Protein: 6.3 g/dL — ABNORMAL LOW (ref 6.5–8.1)

## 2021-02-10 LAB — SAMPLE TO BLOOD BANK

## 2021-02-10 LAB — ABO/RH: ABO/RH(D): O POS

## 2021-02-10 LAB — PREPARE RBC (CROSSMATCH)

## 2021-02-10 MED ORDER — DIPHENHYDRAMINE HCL 25 MG PO CAPS
ORAL_CAPSULE | ORAL | Status: AC
  Filled 2021-02-10: qty 1

## 2021-02-10 MED ORDER — ACETAMINOPHEN 325 MG PO TABS
ORAL_TABLET | ORAL | Status: AC
  Filled 2021-02-10: qty 2

## 2021-02-10 MED ORDER — DIPHENHYDRAMINE HCL 25 MG PO CAPS
25.0000 mg | ORAL_CAPSULE | Freq: Once | ORAL | Status: AC
  Administered 2021-02-10: 25 mg via ORAL

## 2021-02-10 MED ORDER — SODIUM CHLORIDE 0.9% IV SOLUTION
250.0000 mL | Freq: Once | INTRAVENOUS | Status: AC
  Administered 2021-02-10: 250 mL via INTRAVENOUS
  Filled 2021-02-10: qty 250

## 2021-02-10 MED ORDER — ACETAMINOPHEN 325 MG PO TABS
650.0000 mg | ORAL_TABLET | Freq: Once | ORAL | Status: AC
  Administered 2021-02-10: 650 mg via ORAL

## 2021-02-10 NOTE — Patient Instructions (Signed)

## 2021-02-10 NOTE — Progress Notes (Signed)
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  PDL1: 90%  PRIOR THERAPY:  Weekly concurrent chemoradiation with carboplatin for an AUC of 2 and paclitaxel 45 mg/m.  First dose on 12/30/2020.  Status post 6 cycles.  CURRENT THERAPY: None  INTERVAL HISTORY: [name] 87 y.o. male returns to the clinic today for follow-up visit accompanied by his son-in-law.  The patient is feeling fine today with no concerning complaints except for mild fatigue.  He completed the course of radiotherapy earlier today.  He denied having any current chest pain, shortness of breath except with exertion with no cough or hemoptysis.  He denied having any fever or chills.  He has no nausea, vomiting, diarrhea or constipation.  He has no headache or visual changes.  He tolerated the previous course of concurrent chemoradiation fairly well.  The patient is here today for evaluation and repeat blood work before starting cycle #7.  MEDICAL HISTORY: Past Medical History:  Diagnosis Date  . Arthritis   . Hypertension   . Hyperthyroidism     ALLERGIES:  has No Known Allergies.  MEDICATIONS:  Current Outpatient Medications  Medication Sig Dispense Refill  . gabapentin (NEURONTIN) 250 MG/5ML solution Take 3 mLs (150 mg total) by mouth at bedtime. 90 mL 2  . labetalol (NORMODYNE) 200 MG tablet Take 200 mg by mouth 2 (two) times daily.    Marland Kitchen levothyroxine (SYNTHROID) 150 MCG tablet Take 150 mcg by mouth daily before breakfast.    . Olmesartan-Amlodipine-HCTZ 40-10-12.5 MG TABS Take 1 tablet by  mouth daily.    . tamsulosin (FLOMAX) 0.4 MG CAPS capsule Take 0.4 mg by mouth daily.    . naproxen (NAPROSYN) 500 MG tablet Take 500 mg by mouth 2 (two) times daily as needed for moderate pain. (Patient not taking: No sig reported)    . oxyCODONE-acetaminophen (PERCOCET/ROXICET) 5-325 MG tablet Take 1 tablet by mouth every 8 (eight) hours as needed for severe pain. (Patient not taking: Reported on 02/10/2021) 30 tablet 0  . prochlorperazine (COMPAZINE) 10 MG tablet Take 1 tablet (10 mg total) by mouth every 6 (six) hours as needed for nausea or vomiting. (Patient not taking: Reported on 02/10/2021) 30 tablet 0  . tadalafil (CIALIS) 5 MG tablet Take 5 mg by mouth daily. (Patient not taking: No sig reported)     No current facility-administered medications for this visit.    SURGICAL HISTORY:  Past Surgical History:  Procedure Laterality Date  . COLONOSCOPY N/A 09/02/2016   Procedure: COLONOSCOPY;  Surgeon: Rogene Houston, MD;  [facility];  Service: Endoscopy;  Laterality: N/A;  830  . NO PAST SURGERIES    . POLYPECTOMY  09/02/2016   Procedure: POLYPECTOMY;  Surgeon: Rogene Houston, MD;  [facility];  Service: Endoscopy;;    REVIEW OF SYSTEMS:  Constitutional: positive for fatigue Eyes: negative Ears, nose, mouth, throat, and face: negative Respiratory: negative Cardiovascular: negative Gastrointestinal: negative Genitourinary:negative Integument/breast: negative Hematologic/lymphatic: negative Musculoskeletal:negative Neurological: negative Behavioral/Psych: negative Endocrine: negative Allergic/Immunologic: negative   PHYSICAL EXAMINATION: General appearance: alert, cooperative, fatigued and no distress Head: Normocephalic, without  obvious abnormality, atraumatic Neck: no adenopathy, no JVD, supple, symmetrical, trachea midline and thyroid not enlarged, symmetric, no tenderness/mass/nodules Lymph nodes: Cervical, supraclavicular, and axillary nodes  normal. Resp: clear to auscultation bilaterally Back: symmetric, no curvature. ROM normal. No CVA tenderness. Cardio: regular rate and rhythm, S1, S2 normal, no murmur, click, rub or gallop GI: soft, non-tender; bowel sounds normal; no masses,  no organomegaly Extremities: extremities normal, atraumatic, no cyanosis or edema Neurologic: Alert and oriented X 3, normal strength and tone. Normal symmetric reflexes. Normal coordination and gait  ECOG PERFORMANCE STATUS: 1 - Symptomatic but completely ambulatory  Blood pressure 132/72, pulse 97, temperature 98 F (36.7 C), temperature source Tympanic, resp. rate 19, height 6' 2" (1.88 m), weight 127 lb 14.4 oz (58 kg), SpO2 98 %.  LABORATORY DATA: Lab Results  Component Value Date   WBC 1.6 (L) 02/10/2021   HGB 7.8 (L) 02/10/2021   HCT 24.8 (L) 02/10/2021   MCV 88.6 02/10/2021   PLT 128 (L) 02/10/2021      Chemistry      Component Value Date/Time   NA 136 02/10/2021 0839   K 4.8 02/10/2021 0839   CL 101 02/10/2021 0839   CO2 28 02/10/2021 0839   BUN 36 (H) 02/10/2021 0839   CREATININE 1.91 (H) 02/10/2021 0839      Component Value Date/Time   CALCIUM 8.5 (L) 02/10/2021 0839   ALKPHOS 56 02/10/2021 0839   AST 15 02/10/2021 0839   ALT 20 02/10/2021 0839   BILITOT 0.5 02/10/2021 0839       RADIOGRAPHIC STUDIES: No results found.  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  The patient is currently undergoing a course of concurrent chemoradiation with weekly carboplatin for AUC of 2 and paclitaxel 45 Mg/M2 status post 6 cycles. The patient tolerated  this treatment fairly well with no concerning adverse effect except for fatigue. He completed the concurrent radiotherapy earlier today. His total white blood count and absolute neutrophil count are low. I recommended for the patient to discontinue his chemotherapy at this point. I will see him back for follow-up visit in 3 weeks for evaluation and repeat CT scan of the chest for restaging of his disease. For the chemotherapy-induced anemia, I will arrange for the patient to receive at least 1 unit of PRBCs transfusion today. The patient was advised to call immediately if he has any concerning symptoms in the interval. The patient voices understanding of current disease status and treatment options and is in agreement with the current care plan.  All questions were answered. The patient knows to call the clinic with any problems, questions or concerns. We can certainly see the patient much sooner if necessary.   Disclaimer: This note was dictated with voice recognition software. Similar sounding words can inadvertently be transcribed and may not be corrected upon review.

## 2021-02-10 NOTE — Progress Notes (Signed)
Nutrition follow-up completed with patient during infusion for lung cancer. Weight decreased and documented as 127 pounds 14 ounces down from 128.2 pounds on May 12. Noted labs: BUN 36, creatinine 1.91, albumin 2.4, and hemoglobin 7.8. Patient reports his appetite is poor.   States he tries to drink 2 Ensure plus a day. He currently denies constipation, diarrhea, nausea or vomiting.  Nutrition diagnosis: Unintended weight loss continues.  Intervention: Provided additional education on high-calorie, high-protein foods. Made several suggestions on easy to tolerate foods. Recommended patient increase oral nutrition supplements 3 times daily between meals but continue with small amounts of food at mealtime. Encouraged increased fluid intake.  Monitoring, evaluation, goals: Patient will tolerate increased calories and protein to minimize further weight loss.  Next visit: To be scheduled with upcoming treatment as needed.  **Disclaimer: This note was dictated with voice recognition software. Similar sounding words can inadvertently be transcribed and this note may contain transcription errors which may not have been corrected upon publication of note.**

## 2021-02-11 ENCOUNTER — Ambulatory Visit: Payer: Medicare HMO

## 2021-02-11 LAB — TYPE AND SCREEN
ABO/RH(D): O POS
Antibody Screen: NEGATIVE
Unit division: 0

## 2021-02-11 LAB — BPAM RBC
Blood Product Expiration Date: 202206122359
ISSUE DATE / TIME: 202205161202
Unit Type and Rh: 5100

## 2021-02-12 ENCOUNTER — Other Ambulatory Visit: Payer: Self-pay

## 2021-02-12 ENCOUNTER — Ambulatory Visit
Admission: RE | Admit: 2021-02-12 | Discharge: 2021-02-12 | Disposition: A | Discharge status: Home / Self Care | Payer: Medicare HMO | Source: Ambulatory Visit | Attending: Radiation Oncology | Admitting: Radiation Oncology

## 2021-02-12 ENCOUNTER — Ambulatory Visit: Payer: Medicare HMO

## 2021-02-12 DIAGNOSIS — C3411 Malignant neoplasm of upper lobe, right bronchus or lung: Secondary | ICD-10-CM | POA: Diagnosis not present

## 2021-02-13 ENCOUNTER — Encounter: Payer: Self-pay | Admitting: Urology

## 2021-02-13 ENCOUNTER — Ambulatory Visit
Admission: RE | Admit: 2021-02-13 | Discharge: 2021-02-13 | Disposition: A | Discharge status: Home / Self Care | Payer: Medicare HMO | Source: Ambulatory Visit | Attending: Radiation Oncology | Admitting: Radiation Oncology

## 2021-02-13 ENCOUNTER — Telehealth: Payer: Self-pay | Admitting: Internal Medicine

## 2021-02-13 DIAGNOSIS — C3411 Malignant neoplasm of upper lobe, right bronchus or lung: Secondary | ICD-10-CM

## 2021-02-13 MED ORDER — RADIAPLEXRX EX GEL: Freq: Once | CUTANEOUS | Status: AC

## 2021-02-13 NOTE — Telephone Encounter (Signed)
Scheduled per los. Called and spoke with patients daughter. Confirmed appts

## 2021-02-17 ENCOUNTER — Ambulatory Visit: Payer: Medicare HMO

## 2021-02-17 ENCOUNTER — Other Ambulatory Visit: Payer: Medicare HMO

## 2021-03-11 ENCOUNTER — Inpatient Hospital Stay: Payer: Medicare HMO | Attending: Internal Medicine

## 2021-03-11 ENCOUNTER — Other Ambulatory Visit: Payer: Self-pay

## 2021-03-11 ENCOUNTER — Telehealth: Payer: Self-pay | Admitting: Internal Medicine

## 2021-03-11 ENCOUNTER — Other Ambulatory Visit: Payer: Self-pay | Admitting: Medical Oncology

## 2021-03-11 ENCOUNTER — Telehealth: Payer: Self-pay | Admitting: Medical Oncology

## 2021-03-11 DIAGNOSIS — Z5112 Encounter for antineoplastic immunotherapy: Secondary | ICD-10-CM | POA: Insufficient documentation

## 2021-03-11 DIAGNOSIS — Z79899 Other long term (current) drug therapy: Secondary | ICD-10-CM | POA: Diagnosis not present

## 2021-03-11 DIAGNOSIS — C3490 Malignant neoplasm of unspecified part of unspecified bronchus or lung: Secondary | ICD-10-CM

## 2021-03-11 DIAGNOSIS — D649 Anemia, unspecified: Secondary | ICD-10-CM

## 2021-03-11 DIAGNOSIS — C778 Secondary and unspecified malignant neoplasm of lymph nodes of multiple regions: Secondary | ICD-10-CM | POA: Insufficient documentation

## 2021-03-11 DIAGNOSIS — D638 Anemia in other chronic diseases classified elsewhere: Secondary | ICD-10-CM

## 2021-03-11 DIAGNOSIS — C3411 Malignant neoplasm of upper lobe, right bronchus or lung: Secondary | ICD-10-CM | POA: Insufficient documentation

## 2021-03-11 LAB — CMP (CANCER CENTER ONLY)
ALT: 20 U/L (ref 0–44)
AST: 12 U/L — ABNORMAL LOW (ref 15–41)
Albumin: 2.3 g/dL — ABNORMAL LOW (ref 3.5–5.0)
Alkaline Phosphatase: 68 U/L (ref 38–126)
Anion gap: 8 (ref 5–15)
BUN: 62 mg/dL — ABNORMAL HIGH (ref 8–23)
CO2: 27 mmol/L (ref 22–32)
Calcium: 8.6 mg/dL — ABNORMAL LOW (ref 8.9–10.3)
Chloride: 104 mmol/L (ref 98–111)
Creatinine: 2.07 mg/dL — ABNORMAL HIGH (ref 0.61–1.24)
GFR, Estimated: 31 mL/min — ABNORMAL LOW (ref >60)
Glucose, Bld: 94 mg/dL (ref 70–99)
Potassium: 4.9 mmol/L (ref 3.5–5.1)
Sodium: 139 mmol/L (ref 135–145)
Total Bilirubin: 0.3 mg/dL (ref 0.3–1.2)
Total Protein: 7 g/dL (ref 6.5–8.1)

## 2021-03-11 LAB — CBC WITH DIFFERENTIAL (CANCER CENTER ONLY)
Abs Immature Granulocytes: 0.01 10*3/uL (ref 0.00–0.07)
Basophils Absolute: 0 10*3/uL (ref 0.0–0.1)
Basophils Relative: 0 %
Eosinophils Absolute: 0.1 10*3/uL (ref 0.0–0.5)
Eosinophils Relative: 3 %
HCT: 23.9 % — ABNORMAL LOW (ref 39.0–52.0)
Hemoglobin: 7.5 g/dL — ABNORMAL LOW (ref 13.0–17.0)
Immature Granulocytes: 0 %
Lymphocytes Relative: 28 %
Lymphs Abs: 0.9 10*3/uL (ref 0.7–4.0)
MCH: 29.6 pg (ref 26.0–34.0)
MCHC: 31.4 g/dL (ref 30.0–36.0)
MCV: 94.5 fL (ref 80.0–100.0)
Monocytes Absolute: 0.4 10*3/uL (ref 0.1–1.0)
Monocytes Relative: 13 %
Neutro Abs: 1.8 10*3/uL (ref 1.7–7.7)
Neutrophils Relative %: 56 %
Platelet Count: 100 10*3/uL — ABNORMAL LOW (ref 150–400)
RBC: 2.53 MIL/uL — ABNORMAL LOW (ref 4.22–5.81)
RDW: 23.2 % — ABNORMAL HIGH (ref 11.5–15.5)
WBC Count: 3.2 10*3/uL — ABNORMAL LOW (ref 4.0–10.5)
nRBC: 0 % (ref 0.0–0.2)

## 2021-03-11 LAB — SAMPLE TO BLOOD BANK

## 2021-03-11 NOTE — Telephone Encounter (Signed)
Blood transfusion Appt-  scheduled for thursday and dtr notified. I emphasized to her to make sure pt keeps on blue armband.  Scan tomorrow night    Resent [facility] LOS (from 05/4 ) to make a  f/u appt.this week.

## 2021-03-11 NOTE — Progress Notes (Signed)
Dtr aware of transfusion appt .

## 2021-03-11 NOTE — Telephone Encounter (Signed)
Scheduled appointment per 06/14 sch msg. Left deta

## 2021-03-12 ENCOUNTER — Telehealth: Payer: Self-pay | Admitting: Internal Medicine

## 2021-03-12 ENCOUNTER — Ambulatory Visit (HOSPITAL_COMMUNITY)
Admission: RE | Admit: 2021-03-12 | Discharge: 2021-03-12 | Disposition: A | Discharge status: Home / Self Care | Payer: Medicare HMO | Source: Ambulatory Visit | Attending: Internal Medicine | Admitting: Internal Medicine

## 2021-03-12 ENCOUNTER — Telehealth: Payer: Self-pay | Admitting: Nutrition

## 2021-03-12 ENCOUNTER — Encounter: Payer: Medicare HMO | Admitting: Nutrition

## 2021-03-12 DIAGNOSIS — I2510 Atherosclerotic heart disease of native coronary artery without angina pectoris: Secondary | ICD-10-CM | POA: Diagnosis not present

## 2021-03-12 DIAGNOSIS — C3490 Malignant neoplasm of unspecified part of unspecified bronchus or lung: Secondary | ICD-10-CM | POA: Diagnosis not present

## 2021-03-12 DIAGNOSIS — J432 Centrilobular emphysema: Secondary | ICD-10-CM | POA: Diagnosis not present

## 2021-03-12 DIAGNOSIS — I700 Atherosclerosis of aorta: Secondary | ICD-10-CM | POA: Diagnosis not present

## 2021-03-12 NOTE — Telephone Encounter (Signed)
Nutrition follow-up completed with patient's daughter.  Patient status post treatment for lung cancer.  Daughter reports patient is eating really well.  He is drinking a combination of Ensure and boost and consumes approximately 3 a day.  She does not know his current weight.  She denies any type of nutrition impact symptoms but says he is complaining about swollen feet.  I notified provider today.  Patient is scheduled for blood and provider appointment tomorrow.  Encouraged daughter to communicate with provider regarding patient's symptoms.  Daughter will contact RD for further questions or concerns or nutrition follow-up as needed.  **Disclaimer: This note was dictated with voice recognition software. Similar sounding words can inadvertently be transcribed and this note may contain transcription errors which may not have been corrected upon publication of note.**

## 2021-03-12 NOTE — Telephone Encounter (Signed)
R/s per sch msg 6/15, pt aware

## 2021-03-13 ENCOUNTER — Inpatient Hospital Stay: Payer: Medicare HMO

## 2021-03-13 ENCOUNTER — Encounter: Payer: Self-pay | Admitting: Internal Medicine

## 2021-03-13 ENCOUNTER — Other Ambulatory Visit: Payer: Self-pay

## 2021-03-13 ENCOUNTER — Inpatient Hospital Stay (HOSPITAL_BASED_OUTPATIENT_CLINIC_OR_DEPARTMENT_OTHER): Payer: Medicare HMO | Admitting: Internal Medicine

## 2021-03-13 VITALS — BP 140/68 | HR 86 | Temp 98.0°F | Resp 19 | Ht 74.0 in | Wt 143.3 lb

## 2021-03-13 DIAGNOSIS — G893 Neoplasm related pain (acute) (chronic): Secondary | ICD-10-CM

## 2021-03-13 DIAGNOSIS — C3411 Malignant neoplasm of upper lobe, right bronchus or lung: Secondary | ICD-10-CM | POA: Diagnosis not present

## 2021-03-13 DIAGNOSIS — Z79899 Other long term (current) drug therapy: Secondary | ICD-10-CM | POA: Diagnosis not present

## 2021-03-13 DIAGNOSIS — D649 Anemia, unspecified: Secondary | ICD-10-CM

## 2021-03-13 DIAGNOSIS — Z5112 Encounter for antineoplastic immunotherapy: Secondary | ICD-10-CM

## 2021-03-13 DIAGNOSIS — C778 Secondary and unspecified malignant neoplasm of lymph nodes of multiple regions: Secondary | ICD-10-CM | POA: Diagnosis not present

## 2021-03-13 DIAGNOSIS — D638 Anemia in other chronic diseases classified elsewhere: Secondary | ICD-10-CM | POA: Diagnosis not present

## 2021-03-13 LAB — PREPARE RBC (CROSSMATCH)

## 2021-03-13 MED ORDER — ACETAMINOPHEN 325 MG PO TABS
ORAL_TABLET | ORAL | Status: AC
  Filled 2021-03-13: qty 2

## 2021-03-13 MED ORDER — DIPHENHYDRAMINE HCL 25 MG PO CAPS
25.0000 mg | ORAL_CAPSULE | Freq: Once | ORAL | Status: AC
  Administered 2021-03-13: 25 mg via ORAL

## 2021-03-13 MED ORDER — SODIUM CHLORIDE 0.9% IV SOLUTION
250.0000 mL | Freq: Once | INTRAVENOUS | Status: AC
  Administered 2021-03-13: 250 mL via INTRAVENOUS
  Filled 2021-03-13: qty 250

## 2021-03-13 MED ORDER — DIPHENHYDRAMINE HCL 25 MG PO CAPS
ORAL_CAPSULE | ORAL | Status: AC
  Filled 2021-03-13: qty 2

## 2021-03-13 MED ORDER — ACETAMINOPHEN 325 MG PO TABS
650.0000 mg | ORAL_TABLET | Freq: Once | ORAL | Status: AC
Start: 2021-03-13 — End: 2021-03-13
  Administered 2021-03-13: 650 mg via ORAL

## 2021-03-13 NOTE — Patient Instructions (Signed)
Blood Transfusion, Adult, Care After This sheet gives you information about how to care for yourself after your procedure. Your doctor may also give you more specific instructions. If youhave problems or questions, contact your doctor. What can I expect after the procedure? After the procedure, it is common to have: Bruising and soreness at the IV site. A fever or chills on the day of the procedure. This may be your body's response to the new blood cells received. A headache. Follow these instructions at home: Insertion site care     Follow instructions from your doctor about how to take care of your insertion site. This is where an IV tube was put into your vein. Make sure you: Wash your hands with soap and water before and after you change your bandage (dressing). If you cannot use soap and water, use hand sanitizer. Change your bandage as told by your doctor. Check your insertion site every day for signs of infection. Check for: Redness, swelling, or pain. Bleeding from the site. Warmth. Pus or a bad smell. General instructions Take over-the-counter and prescription medicines only as told by your doctor. Rest as told by your doctor. Go back to your normal activities as told by your doctor. Keep all follow-up visits as told by your doctor. This is important. Contact a doctor if: You have itching or red, swollen areas of skin (hives). You feel worried or nervous (anxious). You feel weak after doing your normal activities. You have redness, swelling, warmth, or pain around the insertion site. You have blood coming from the insertion site, and the blood does not stop with pressure. You have pus or a bad smell coming from the insertion site. Get help right away if: You have signs of a serious reaction. This may be coming from an allergy or the body's defense system (immune system). Signs include: Trouble breathing or shortness of breath. Swelling of the face or feeling warm  (flushed). Fever or chills. Head, chest, or back pain. Dark pee (urine) or blood in the pee. Widespread rash. Fast heartbeat. Feeling dizzy or light-headed. You may receive your blood transfusion in an outpatient setting. If so, youwill be told whom to contact to report any reactions. These symptoms may be an emergency. Do not wait to see if the symptoms will go away. Get medical help right away. Call your local emergency services (911 in the [place] Do not drive yourself to the hospital. Summary Bruising and soreness at the IV site are common. Check your insertion site every day for signs of infection. Rest as told by your doctor. Go back to your normal activities as told by your doctor. Get help right away if you have signs of a serious reaction. This information is not intended to replace advice given to you by your health care provider. Make sure you discuss any questions you have with your healthcare provider. Document Revised: 03/09/2019 Document Reviewed: 03/09/2019 Elsevier Patient Education  2022 Elsevier Inc.  

## 2021-03-13 NOTE — Progress Notes (Signed)
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  Biomarker Findings Tumor Mutational Burden - 10 Muts/Mb Microsatellite status - MS-Stable Genomic Findings For a complete list of the genes assayed, please refer to the Appendix. KRAS G12C NFKBIA amplification NKX2-1 amplification RBM10 D668f*80 7 Disease relevant genes with no reportable alterations: ALK, BRAF, EGFR, ERBB2, MET, RET, ROS1   PDL1: 90%   PRIOR THERAPY:  Weekly concurrent chemoradiation with carboplatin for an AUC of 2 and paclitaxel 45 mg/m.  First dose on 12/30/2020.  Status post 6 cycles.   CURRENT THERAPY: First-line treatment with immunotherapy with Libtayo (Cempilimab) 350 Mg IV every 3 weeks.  INTERVAL HISTORY: Brian KIRKER89y.o. male returns to the clinic today for follow-up visit accompanied by his son.  The patient is feeling fine today with no concerning complaints except for fatigue and feeling cold.  He also has mild pain on the right side of the chest with no significant shortness of breath, cough or hemoptysis.  He denied having any fever or chills.  He has no nausea, vomiting, diarrhea or constipation.  He has no headache or visual changes.  He completed a course of concurrent chemoradiation with weekly carboplatin and paclitaxel and tolerated his treatment well except for the chemotherapy-induced anemia and he required PRBCs transfusion regularly.  The patient had repeat CT scan of the chest performed yesterday and he is here for evaluation and discussion of his scan results and treatment  options.  MEDICAL HISTORY: Past Medical History:  Diagnosis Date   Arthritis    Hypertension    Hyperthyroidism     ALLERGIES:  has No Known Allergies.  MEDICATIONS:  Current Outpatient Medications  Medication Sig Dispense Refill   gabapentin (NEURONTIN) 250 MG/5ML solution Take 3 mLs (150 mg total) by mouth at bedtime. 90 mL 2   labetalol (NORMODYNE) 200 MG tablet Take 200 mg by mouth 2 (two) times daily.     levothyroxine (SYNTHROID) 150 MCG tablet Take 150 mcg by mouth daily before breakfast.     naproxen (NAPROSYN) 500 MG tablet Take 500 mg by mouth 2 (two) times daily as needed for moderate pain. (Patient not taking: No sig reported)     Olmesartan-Amlodipine-HCTZ 40-10-12.5 MG TABS Take 1 tablet by mouth daily.     oxyCODONE-acetaminophen (PERCOCET/ROXICET) 5-325 MG tablet Take 1 tablet by mouth every 8 (eight) hours as needed for severe pain. (Patient not taking: Reported on 02/10/2021) 30 tablet 0   prochlorperazine (COMPAZINE) 10 MG tablet Take 1 tablet (10 mg total) by mouth every 6 (six) hours as needed for nausea or vomiting. (Patient not taking: Reported on 02/10/2021) 30 tablet 0   tadalafil (CIALIS) 5 MG tablet Take 5 mg by mouth daily. (Patient not taking: No sig reported)     tamsulosin (FLOMAX) 0.4 MG CAPS capsule Take 0.4 mg by mouth daily.     No current facility-administered medications for this visit.    SURGICAL HISTORY:  Past Surgical History:  Procedure Laterality Date   COLONOSCOPY N/A 09/02/2016   Procedure: COLONOSCOPY;  Surgeon: NRogene Houston MD;  Location: [facility];  Service: Endoscopy;  Laterality: N/A;  830   NO PAST SURGERIES     POLYPECTOMY  09/02/2016   Procedure: POLYPECTOMY;  Surgeon: Rogene Houston, MD;  Location: [facility];  Service: Endoscopy;;    REVIEW OF SYSTEMS:  Constitutional: positive for fatigue and weight loss Eyes: negative Ears, nose, mouth, throat, and face: negative Respiratory: positive for dyspnea on exertion and  pleurisy/chest pain Cardiovascular: negative Gastrointestinal: negative Genitourinary:negative Integument/breast: negative Hematologic/lymphatic: negative Musculoskeletal:negative Neurological: negative Behavioral/Psych: negative Endocrine: negative Allergic/Immunologic: negative   PHYSICAL EXAMINATION: General appearance: alert, cooperative, fatigued, and no distress Head: Normocephalic, without obvious abnormality, atraumatic Neck: no adenopathy, no JVD, supple, symmetrical, trachea midline, and thyroid not enlarged, symmetric, no tenderness/mass/nodules Lymph nodes: Cervical, supraclavicular, and axillary nodes normal. Resp: clear to auscultation bilaterally Back: symmetric, no curvature. ROM normal. No CVA tenderness. Cardio: regular rate and rhythm, S1, S2 normal, no murmur, click, rub or gallop GI: soft, non-tender; bowel sounds normal; no masses,  no organomegaly Extremities: extremities normal, atraumatic, no cyanosis or edema Neurologic: Alert and oriented X 3, normal strength and tone. Normal symmetric reflexes. Normal coordination and gait  ECOG PERFORMANCE STATUS: 1 - Symptomatic but completely ambulatory  Blood pressure 140/68, pulse 86, temperature 98 F (36.7 C), temperature source Tympanic, resp. rate 19, height 6' 2"  (1.88 m), weight 143 lb 4.8 oz (65 kg), SpO2 93 %.  LABORATORY DATA: Lab Results  Component Value Date   WBC 3.2 (L) 03/11/2021   HGB 7.5 (L) 03/11/2021   HCT 23.9 (L) 03/11/2021   MCV 94.5 03/11/2021   PLT 100 (L) 03/11/2021      Chemistry      Component Value Date/Time   NA 139 03/11/2021 1108   K 4.9 03/11/2021 1108   CL 104 03/11/2021 1108   CO2 27 03/11/2021 1108   BUN 62 (H) 03/11/2021 1108   CREATININE 2.07 (H) 03/11/2021 1108      Component Value Date/Time   CALCIUM 8.6 (L) 03/11/2021 1108   ALKPHOS 68 03/11/2021 1108   AST 12 (L) 03/11/2021 1108   ALT 20 03/11/2021 1108   BILITOT 0.3 03/11/2021 1108       RADIOGRAPHIC  STUDIES: No results found.  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  The patient underwent a course of concurrent chemoradiation with weekly carboplatin for AUC of 2 and paclitaxel 45 Mg/M2 status post 6 cycles. He tolerated the previous course of his concurrent chemoradiation fairly well except for fatigue. He had repeat CT scan of the chest performed recently.  I personally and independently reviewed the scan images and discussed the results with the patient and his son today. Unfortunately his CT scan of the chest showed interval enlargement of a large mass centered at the periphery of the right upper lobe and extending into the adjacent chest wall measuring 8.4 x 6.7 cm with bony destruction of the right third and fourth ribs substantially increased compared to the prior examination and the findings are consistent with worsened malignancy. I had a lengthy discussion with the patient and his son about his condition. The patient has PD-L1 expression of 90% and I felt he will be  a good candidate for treatment with first-line single agent immunotherapy. I recommended for him treatment with Libtayo (Cempilimab) 350 Mg IV every 3 weeks. I discussed with the patient the adverse effect of this treatment including but not limited to immunotherapy mediated skin rash, diarrhea, inflammation of the lung, kidney, liver, thyroid or other endocrine dysfunction. The patient is expected to start the first cycle of this treatment next week.  He was also given the option of palliative care but he would like to proceed with treatment. He  will come back for follow-up visit in 4 weeks for evaluation with the start of cycle #2. For the anemia of neoplastic disease, will arrange for the patient to receive 2 units of PRBCs transfusion today. The patient was advised to call immediately if he has any other concerning symptoms in the interval. The patient voices understanding of current disease status and treatment options and is in agreement with the current care plan.  All questions were answered. The patient knows to call the clinic with any problems, questions or concerns. We can certainly see the patient much sooner if necessary.   Disclaimer: This note was dictated with voice recognition software. Similar sounding words can inadvertently be transcribed and may not be corrected upon review.

## 2021-03-13 NOTE — Progress Notes (Signed)
DISCONTINUE ON PATHWAY REGIMEN - Non-Small Cell Lung     Administer weekly:     Paclitaxel      Carboplatin   **Always confirm dose/schedule in your pharmacy ordering system**  REASON: Disease Progression PRIOR TREATMENT: [id number]: Carboplatin AUC=2 + Paclitaxel 45 mg/m2 Weekly During Radiation TREATMENT RESPONSE: Progressive Disease (PD)  START OFF PATHWAY REGIMEN - Non-Small Cell Lung   [id number]:Cemiplimab 350 mg IV D1 q21 Days:   A cycle is every 21 days:     Cemiplimab-rwlc   **Always confirm dose/schedule in your pharmacy ordering system**  Patient Characteristics: Stage IV Metastatic, Nonsquamous, Molecular Analysis Completed, Molecular Alteration Present and Targeted Therapy Exhausted OR EGFR Exon 20+ or KRAS G12C+ Present and No Prior Chemo/Immunotherapy OR No Alteration Present, Initial  Chemotherapy/Immunotherapy, PS = 0, 1, BRAF/MET/KRAS Mutation Positive, Candidate for Immunotherapy, PD-L1 Expression Positive  ? 50% (TPS) and Immunotherapy Candidate Therapeutic Status: Stage IV Metastatic Histology: Nonsquamous Cell Broad Molecular Profiling Status: Molecular Analysis Completed Molecular Analysis Results: EGFR Exon 20 Insertion Present or KRAS G12C Present, and No Prior Chemo/Immunotherapy ECOG Performance Status: 1 Chemotherapy/Immunotherapy Line of Therapy: Initial Chemotherapy/Immunotherapy Immunotherapy Candidate Status: Candidate for Immunotherapy PD-L1 Expression Status: PD-L1 Positive ? 50% (TPS) Intent of Therapy: Non-Curative / Palliative Intent, Discussed with Patient

## 2021-03-14 ENCOUNTER — Ambulatory Visit: Payer: Medicare HMO | Admitting: Physician Assistant

## 2021-03-14 LAB — TYPE AND SCREEN
ABO/RH(D): O POS
Antibody Screen: NEGATIVE
Unit division: 0

## 2021-03-14 LAB — BPAM RBC
Blood Product Expiration Date: 202207182359
ISSUE DATE / TIME: 202206161300
Unit Type and Rh: 5100

## 2021-03-19 ENCOUNTER — Inpatient Hospital Stay: Payer: Medicare HMO

## 2021-03-19 ENCOUNTER — Other Ambulatory Visit: Payer: Self-pay

## 2021-03-19 VITALS — BP 152/76 | HR 74 | Temp 98.4°F | Resp 18

## 2021-03-19 DIAGNOSIS — C778 Secondary and unspecified malignant neoplasm of lymph nodes of multiple regions: Secondary | ICD-10-CM | POA: Diagnosis not present

## 2021-03-19 DIAGNOSIS — C3411 Malignant neoplasm of upper lobe, right bronchus or lung: Secondary | ICD-10-CM

## 2021-03-19 DIAGNOSIS — Z79899 Other long term (current) drug therapy: Secondary | ICD-10-CM | POA: Diagnosis not present

## 2021-03-19 DIAGNOSIS — Z5112 Encounter for antineoplastic immunotherapy: Secondary | ICD-10-CM | POA: Diagnosis not present

## 2021-03-19 LAB — CMP (CANCER CENTER ONLY)
ALT: 26 U/L (ref 0–44)
AST: 19 U/L (ref 15–41)
Albumin: 2.2 g/dL — ABNORMAL LOW (ref 3.5–5.0)
Alkaline Phosphatase: 64 U/L (ref 38–126)
Anion gap: 8 (ref 5–15)
BUN: 36 mg/dL — ABNORMAL HIGH (ref 8–23)
CO2: 25 mmol/L (ref 22–32)
Calcium: 8.4 mg/dL — ABNORMAL LOW (ref 8.9–10.3)
Chloride: 103 mmol/L (ref 98–111)
Creatinine: 1.8 mg/dL — ABNORMAL HIGH (ref 0.61–1.24)
GFR, Estimated: 36 mL/min — ABNORMAL LOW (ref >60)
Glucose, Bld: 95 mg/dL (ref 70–99)
Potassium: 4.5 mmol/L (ref 3.5–5.1)
Sodium: 136 mmol/L (ref 135–145)
Total Bilirubin: 0.5 mg/dL (ref 0.3–1.2)
Total Protein: 7.2 g/dL (ref 6.5–8.1)

## 2021-03-19 LAB — CBC WITH DIFFERENTIAL (CANCER CENTER ONLY)
Abs Immature Granulocytes: 0.04 10*3/uL (ref 0.00–0.07)
Basophils Absolute: 0 10*3/uL (ref 0.0–0.1)
Basophils Relative: 0 %
Eosinophils Absolute: 0.2 10*3/uL (ref 0.0–0.5)
Eosinophils Relative: 5 %
HCT: 28.4 % — ABNORMAL LOW (ref 39.0–52.0)
Hemoglobin: 9.3 g/dL — ABNORMAL LOW (ref 13.0–17.0)
Immature Granulocytes: 1 %
Lymphocytes Relative: 16 %
Lymphs Abs: 0.6 10*3/uL — ABNORMAL LOW (ref 0.7–4.0)
MCH: 30.5 pg (ref 26.0–34.0)
MCHC: 32.7 g/dL (ref 30.0–36.0)
MCV: 93.1 fL (ref 80.0–100.0)
Monocytes Absolute: 0.4 10*3/uL (ref 0.1–1.0)
Monocytes Relative: 11 %
Neutro Abs: 2.5 10*3/uL (ref 1.7–7.7)
Neutrophils Relative %: 67 %
Platelet Count: 90 10*3/uL — ABNORMAL LOW (ref 150–400)
RBC: 3.05 MIL/uL — ABNORMAL LOW (ref 4.22–5.81)
RDW: 20.4 % — ABNORMAL HIGH (ref 11.5–15.5)
WBC Count: 3.8 10*3/uL — ABNORMAL LOW (ref 4.0–10.5)
nRBC: 0 % (ref 0.0–0.2)

## 2021-03-19 LAB — TSH: TSH: 2.117 u[IU]/mL (ref 0.320–4.118)

## 2021-03-19 MED ORDER — SODIUM CHLORIDE 0.9 % IV SOLN
Freq: Once | INTRAVENOUS | Status: AC
  Filled 2021-03-19: qty 250

## 2021-03-19 MED ORDER — SODIUM CHLORIDE 0.9 % IV SOLN
350.0000 mg | Freq: Once | INTRAVENOUS | Status: AC
  Administered 2021-03-19: 350 mg via INTRAVENOUS
  Filled 2021-03-19: qty 7

## 2021-03-19 NOTE — Progress Notes (Signed)
Per Dr. Julien Nordmann ok to treat with Plt 90 and Creat 1.8

## 2021-03-19 NOTE — Patient Instructions (Signed)
[facility]  Discharge Instructions: Thank you for choosing [facility] to provide your oncology and hematology care.   If you have a lab appointment with the [facility], please go directly to the [facility] and check in at the registration area.   Wear comfortable clothing and clothing appropriate for easy access to any Portacath or PICC line.   We strive to give you quality time with your provider. You may need to reschedule your appointment if you arrive late (15 or more minutes).  Arriving late affects you and other patients whose appointments are after yours.  Also, if you miss three or more appointments without notifying the office, you may be dismissed from the clinic at the provider's discretion.      For prescription refill requests, have your pharmacy contact our office and allow 72 hours for refills to be completed.    Today you received the following chemotherapy and/or immunotherapy agents opdivo      To help prevent nausea and vomiting after your treatment, we encourage you to take your nausea medication as directed.  BELOW ARE SYMPTOMS THAT SHOULD BE REPORTED IMMEDIATELY: *FEVER GREATER THAN 100.4 F (38 C) OR HIGHER *CHILLS OR SWEATING *NAUSEA AND VOMITING THAT IS NOT CONTROLLED WITH YOUR NAUSEA MEDICATION *UNUSUAL SHORTNESS OF BREATH *UNUSUAL BRUISING OR BLEEDING *URINARY PROBLEMS (pain or burning when urinating, or frequent urination) *BOWEL PROBLEMS (unusual diarrhea, constipation, pain near the anus) TENDERNESS IN MOUTH AND THROAT WITH OR WITHOUT PRESENCE OF ULCERS (sore throat, sores in mouth, or a toothache) UNUSUAL RASH, SWELLING OR PAIN  UNUSUAL VAGINAL DISCHARGE OR ITCHING   Items with * indicate a potential emergency and should be followed up as soon as possible or go to the Emergency Department if any problems should occur.  Please show the CHEMOTHERAPY ALERT CARD or IMMUNOTHERAPY ALERT CARD at check-in to the  Emergency Department and triage nurse.  Should you have questions after your visit or need to cancel or reschedule your appointment, please contact [facility]  Dept: [phone]  and follow the prompts.  Office hours are 8:00 a.m. to 4:30 p.m. Monday - Friday. Please note that voicemails left after 4:00 p.m. may not be returned until the following business day.  We are closed weekends and major holidays. You have access to a nurse at all times for urgent questions. Please call the main number to the clinic Dept: [phone] and follow the prompts.   For any non-urgent questions, you may also contact your provider using MyChart. We now offer e-Visits for anyone 85 and older to request care online for non-urgent symptoms. For details visit [email or web address].   Also download the MyChart app! Go to the app store, search "MyChart", open the app, select [facility], and log in with your MyChart username and password.  Due to Covid, a mask is required upon entering the hospital/clinic. If you do not have a mask, one will be given to you upon arrival. For doctor visits, patients may have 1 support person aged 6 or older with them. For treatment visits, patients cannot have anyone with them due to current Covid guidelines and our immunocompromised population.

## 2021-03-27 ENCOUNTER — Ambulatory Visit
Admission: RE | Admit: 2021-03-27 | Discharge: 2021-03-27 | Disposition: A | Discharge status: Home / Self Care | Payer: Medicare HMO | Source: Ambulatory Visit | Attending: Urology | Admitting: Urology

## 2021-03-27 ENCOUNTER — Other Ambulatory Visit: Payer: Self-pay

## 2021-03-27 ENCOUNTER — Encounter: Payer: Self-pay | Admitting: Urology

## 2021-03-27 DIAGNOSIS — C3411 Malignant neoplasm of upper lobe, right bronchus or lung: Secondary | ICD-10-CM

## 2021-03-27 NOTE — Progress Notes (Signed)
  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  Beams/energy:   A five field 3D conformal treatment arrangement was used delivering 6 and 10 MV photons.  Daily image-guidance CT was used to align the treatment with the targeted volume  Narrative: The patient tolerated radiation treatment relatively well.  The patient experienced some occasional productive cough with whitish sputum but denied hemoptysis, esophagitis or dysphagia.  The patient also noted fatigue.  Plan: The patient has completed radiation treatment. The patient will return to radiation oncology clinic for routine followup in one month. I advised him to call or return sooner if he has any questions or concerns related to his recovery or treatment.  ________________________________  Sheral Apley. Tammi Klippel, M.D.

## 2021-03-27 NOTE — Progress Notes (Signed)
No cough denies any pain. Chest hurts sometimes. Better than it was but still hurts some times. Is concerned that he still has to use and walker and is so weak discussed the fatigue and weakness that is part of having chemotherapy and radiation and that it may take a while to recover fully from this.

## 2021-03-27 NOTE — Progress Notes (Signed)
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  Narrative:  I spoke with the patient to conduct his routine scheduled 1 month follow up visit via telephone to spare the patient unnecessary potential exposure in the healthcare setting during the current COVID-19 pandemic.  The patient was notified in advance and gave permission to proceed with this visit format. He tolerated radiation treatment relatively well.  The patient experienced some occasional productive cough with whitish sputum but denied hemoptysis, esophagitis or dysphagia.  The patient also noted fatigue.                              On review of systems, the patient states that he is doing well in general and is without complaints aside from some residual fatigue.  He specifically denies increased shortness of breath, productive cough, hemoptysis, chest pain or dysphagia.  He reports occasional pain in the right chest wall that comes and goes spontaneously. He feels that his fatigue is gradually improving but he still has to rely on assistance of a walker for support and safety with ambulation. Overall, he is pleased with his progress to date. He had a recent follow-up visit with Dr. Earlie Server on 03/13/2021 with a repeat restaging CT chest performed on 03/12/2021.  Unfortunately, the scan showed disease progression with enlargement of the right upper lobe mass with increased bony destruction of the right third and fourth ribs.  The  current plan is to proceed with immunotherapy with Libtayo (Cempilimab) 350 Mg IV every 3 weeks, to begin on 04/10/2021.  ALLERGIES:  has No Known Allergies.  Meds: Current Outpatient Medications  Medication Sig Dispense Refill   gabapentin (NEURONTIN) 250 MG/5ML solution Take 3 mLs (150 mg total) by mouth at bedtime. 90 mL 2   labetalol (NORMODYNE) 200 MG tablet Take 200 mg by mouth 2 (two) times daily.     levothyroxine (SYNTHROID) 150 MCG tablet Take 150 mcg by mouth daily before breakfast.     naproxen (NAPROSYN) 500 MG tablet Take 500 mg by mouth 2 (two) times daily as needed for moderate pain.     Olmesartan-Amlodipine-HCTZ 40-10-12.5 MG TABS Take 1 tablet by mouth daily.     oxyCODONE-acetaminophen (PERCOCET/ROXICET) 5-325 MG tablet Take 1 tablet by mouth every 8 (eight) hours as needed for severe pain. 30 tablet 0   prochlorperazine (COMPAZINE) 10 MG tablet Take 1 tablet (10 mg total) by mouth every 6 (six) hours as needed for nausea or vomiting. 30 tablet 0   tadalafil (CIALIS) 5 MG tablet Take 5 mg by mouth daily.     tamsulosin (FLOMAX) 0.4 MG CAPS capsule Take 0.4 mg by mouth daily.     No current facility-administered medications for this encounter.    Physical Findings:  vitals were not taken for this visit.  Pain Assessment Pain Score: 0-No pain/ Unable to assess due to telephone follow-up visit format.  Lab Findings: Lab Results  Component Value Date   WBC 3.8 (L) 03/19/2021   HGB 9.3 (L) 03/19/2021   HCT 28.4 (L) 03/19/2021   MCV 93.1 03/19/2021   PLT 90 (L) 03/19/2021     Radiographic Findings: CT Chest Wo Contrast  Result Date: 03/13/2021 CLINICAL DATA:  Lung cancer restaging, chemo radiation EXAM: CT CHEST WITHOUT CONTRAST TECHNIQUE: Multidetector CT imaging of the chest was performed following the standard protocol without IV contrast. COMPARISON:  CT chest, 10/23/2020, PET-CT, 11/25/2020 FINDINGS: Cardiovascular: Aortic atherosclerosis. Normal heart size.  Scattered left and right coronary artery calcifications. No pericardial effusion. Mediastinum/Nodes: No enlarged mediastinal, hilar, or axillary lymph nodes. Heterogeneous, multinodular thyroid. Trachea, and esophagus demonstrate no significant findings. Lungs/Pleura: Interval enlargement of a large mass centered at the periphery of the right upper lobe, now measuring at least 8.4 x 6.7 cm, previously 7.5 x 5.5 cm when measured similarly (series 2, image 51). Severe centrilobular emphysema with large bullae at the lung basis. No pleural effusion or pneumothorax. Upper Abdomen: No acute abnormality. Musculoskeletal: Bony destruction of the right third and fourth ribs, substantially increased compared to prior examination, corresponding to the enlarged mass. IMPRESSION: 1. Interval enlargement of a large mass centered at the periphery of the right upper lobe and extending into the adjacent chest wall, now measuring at least 8.4 x 6.7 cm, previously 7.5 x 5.5 cm when measured similarly. Bony destruction of the right third and fourth ribs, substantially increased compared to prior examination, corresponding to the enlarged mass. Findings are consistent with worsened malignancy. 2. Severe emphysema. 3. Coronary artery disease. 4. Enlarged, multinodular thyroid. In the setting of significant comorbidities or limited life expectancy, no follow-up recommended (ref: J Am Coll Radiol. 2015 Feb;12(2): 143-50). Aortic Atherosclerosis (ICD10-I70.0) and Emphysema (ICD10-J43.9). Electronically Signed   By: Eddie Candle M.D.   On: 03/13/2021 12:04    Impression/Plan: 1. 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 He appears to have recovered well from the effects of radiation and is currently without complaints. His recent restaging CT chest performed on 03/12/2021 showed disease progression with enlargement of the right upper lobe mass with increased bony destruction of the  right third and fourth ribs.  The current plan is to proceed with immunotherapy with Libtayo (Cempilimab) 350 Mg IV every 3 weeks, to begin on 04/10/2021. We discussed that while we are happy to continue to participate in his care if clinically indicated, at this point, we will plan to follow up on an as needed basis. He will continue in routine follow up under the care and direction of Dr. Julien Nordmann for continued management of his systemic disease going forward. He knows that he is welcome to call with any questions or concerns related to his prior radiation and is comfortable and in agreement with the stated plan.    Nicholos Johns, PA-C

## 2021-04-09 NOTE — Progress Notes (Signed)
[facility] OFFICE PROGRESS NOTE  [name], MD [address] [place] [address]  DIAGNOSIS: Stage IVA (T4, N3, M1b) non-small cell lung cancer, adenosquamous carcinoma in March 2022 and presented with large mass involving the lateral right upper lobe and right chest wall soft tissue in addition to right hilar, mediastinal and right subpectoral and supraclavicular lymphadenopathy.   Biomarker Findings Tumor Mutational Burden - 10 Muts/Mb Microsatellite status - MS-Stable Genomic Findings For a complete list of the genes assayed, please refer to the Appendix. KRAS G12C NFKBIA amplification NKX2-1 amplification RBM10 D611f*80 7 Disease relevant genes with no reportable alterations: ALK, BRAF, EGFR, ERBB2, MET, RET, ROS1   PDL1: 90%  PRIOR THERAPY: Weekly concurrent chemoradiation with carboplatin for an AUC of 2 and paclitaxel 45 mg/m.  First dose on 12/30/2020.  Status post 6 cycles.  CURRENT THERAPY: First-line treatment with immunotherapy with Libtayo (Cempilimab) 350 Mg IV every 3 weeks. First dose on 03/19/21.  INTERVAL HISTORY: HRYELAN [email or web address].[email or web address]. male returns to the clinic today for a follow-up visit accompanied by his son in law.  The patient is feeling fatigued today. He continues to have a poor appetite secondary to taste alterations. He lost a significant amount of weight since his last appointment. He lost about 15 lbs. He drinks 3 boost/ensures per day. From a description of what he eats on a typical day, it sounds like he only eats one meal a day and a few smaller snacks during the day. He also feels like solids occasionally are getting stuck in his throat. He denies pain or burning with swallowing. Denies thrush. His dysphagia waxes and wanes but overall is not improved since completing radiation. After the patient completed a course of concurrent chemoradiation.  He had evidence of disease progression after this treatment and recently was  started on single agent immunotherapy with Libtayo due to his PD-L1 expression of 90%.  The patient is status post his first cycle of immunotherapy and tolerated it fairly well without any concerning adverse side effects.  He continues to report some fatigue and cold i intolerance. The patient is also had some trouble with anemia requiring red blood cell transfusions. He denies any night sweats.  He denies any nausea, vomiting, diarrhea, or constipation.  He denies any headache or visual changes.  He denies any shortness of breath, hemoptysis, or cough. He denies rashes or skin changes. The patient is here today for evaluation before starting cycle #2.     MEDICAL HISTORY: Past Medical History:  Diagnosis Date   Arthritis    Hypertension    Hyperthyroidism     ALLERGIES:  has No Known Allergies.  MEDICATIONS:  Current Outpatient Medications  Medication Sig Dispense Refill   gabapentin (NEURONTIN) 250 MG/5ML solution Take 3 mLs (150 mg total) by mouth at bedtime. 90 mL 2   labetalol (NORMODYNE) 200 MG tablet Take 200 mg by mouth 2 (two) times daily.     levothyroxine (SYNTHROID) 150 MCG tablet Take 150 mcg by mouth daily before breakfast.     naproxen (NAPROSYN) 500 MG tablet Take 500 mg by mouth 2 (two) times daily as needed for moderate pain.     Olmesartan-Amlodipine-HCTZ 40-10-12.5 MG TABS Take 1 tablet by mouth daily.     oxyCODONE-acetaminophen (PERCOCET/ROXICET) 5-325 MG tablet Take 1 tablet by mouth every 8 (eight) hours as needed for severe pain. 30 tablet 0   prochlorperazine (COMPAZINE) 10 MG tablet Take 1 tablet (10 mg total) by  mouth every 6 (six) hours as needed for nausea or vomiting. 30 tablet 0   tadalafil (CIALIS) 5 MG tablet Take 5 mg by mouth daily.     tamsulosin (FLOMAX) 0.4 MG CAPS capsule Take 0.4 mg by mouth daily.     No current facility-administered medications for this visit.   Facility-Administered Medications Ordered in Other Visits  Medication Dose Route  Frequency Provider Last Rate Last Admin   0.9 %  sodium chloride infusion   Intravenous Once Curt Bears, MD       cemiplimab-rwlc (LIBTAYO) 350 mg in sodium chloride 0.9 % 100 mL chemo infusion  350 mg Intravenous Once Curt Bears, MD 214 mL/hr at 04/10/21 1248 350 mg at 04/10/21 1248    SURGICAL HISTORY:  Past Surgical History:  Procedure Laterality Date   COLONOSCOPY N/A 09/02/2016   Procedure: COLONOSCOPY;  Surgeon: Rogene Houston, MD;  Location: [facility];  Service: Endoscopy;  Laterality: N/A;  830   NO PAST SURGERIES     POLYPECTOMY  09/02/2016   Procedure: POLYPECTOMY;  Surgeon: Rogene Houston, MD;  Location: [facility];  Service: Endoscopy;;    REVIEW OF SYSTEMS:   Review of Systems  Constitutional: Positive for fatigue, appetite change, weight loss.  Positive for cold intolerance.  Negative for chills and fever. HENT: Positive for dysphagia.  Positive for taste alterations.  Negative for mouth sores, nosebleeds, or sore throat. Eyes: Negative for eye problems and icterus.  Respiratory: Negative for cough, hemoptysis, shortness of breath and wheezing.   Cardiovascular: Negative for chest pain and leg swelling.  Gastrointestinal: Negative for abdominal pain, constipation, diarrhea, nausea and vomiting.  Genitourinary: Negative for bladder incontinence, difficulty urinating, dysuria, frequency and hematuria.   Musculoskeletal: Negative for back pain, gait problem, neck pain and neck stiffness.  Skin: Negative for itching and rash.  Neurological: Negative for dizziness, extremity weakness, gait problem, headaches, light-headedness and seizures.  Hematological: Negative for adenopathy. Does not bruise/bleed easily.  Psychiatric/Behavioral: Negative for confusion, depression and sleep disturbance. The patient is not nervous/anxious.     PHYSICAL EXAMINATION:  Blood pressure 106/74, pulse 74, temperature (!) 97 F (36.1 C), temperature source Tympanic, resp. rate  18, height '6\' 2"'  (1.88 m), weight 128 lb 3.2 oz (58.2 kg), SpO2 95 %.  ECOG PERFORMANCE STATUS: 2  Physical Exam  Constitutional: Oriented to person, place, and time and thin appearing male and in no distress.  HENT:  Head: Normocephalic and atraumatic.  Mouth/Throat: Oropharynx is clear and moist. No oropharyngeal exudate.  No evidence of thrush Eyes: Conjunctivae are normal. Right eye exhibits no discharge. Left eye exhibits no discharge. No scleral icterus.  Neck: Normal range of motion. Neck supple.  Cardiovascular: Normal rate, regular rhythm, normal heart sounds and intact distal pulses.   Pulmonary/Chest: Effort normal and breath sounds normal. No respiratory distress. No wheezes. No rales.  Abdominal: Soft. Bowel sounds are normal. Exhibits no distension and no mass. There is no tenderness.  Musculoskeletal: Normal range of motion. Exhibits no edema.  Lymphadenopathy:    No cervical adenopathy.  Neurological: Alert and oriented to person, place, and time. Exhibits muscle wasting.  The patient was examined in the wheelchair. Skin: Skin is warm and dry. No rash noted. Not diaphoretic. No erythema. No pallor.  Psychiatric: Mood, memory and judgment normal.  Vitals reviewed.  LABORATORY DATA: Lab Results  Component Value Date   WBC 3.2 (L) 04/10/2021   HGB 9.2 (L) 04/10/2021   HCT 29.1 (L) 04/10/2021  MCV 97.7 04/10/2021   PLT 100 (L) 04/10/2021      Chemistry      Component Value Date/Time   NA 136 04/10/2021 1050   K 5.0 04/10/2021 1050   CL 102 04/10/2021 1050   CO2 27 04/10/2021 1050   BUN 55 (H) 04/10/2021 1050   CREATININE 2.23 (H) 04/10/2021 1050      Component Value Date/Time   CALCIUM 8.9 04/10/2021 1050   ALKPHOS 71 04/10/2021 1050   AST 23 04/10/2021 1050   ALT 47 (H) 04/10/2021 1050   BILITOT 0.3 04/10/2021 1050       RADIOGRAPHIC STUDIES:  CT Chest Wo Contrast  Result Date: 03/13/2021 CLINICAL DATA:  Lung cancer restaging, chemo radiation  EXAM: CT CHEST WITHOUT CONTRAST TECHNIQUE: Multidetector CT imaging of the chest was performed following the standard protocol without IV contrast. COMPARISON:  CT chest, 10/23/2020, PET-CT, 11/25/2020 FINDINGS: Cardiovascular: Aortic atherosclerosis. Normal heart size. Scattered left and right coronary artery calcifications. No pericardial effusion. Mediastinum/Nodes: No enlarged mediastinal, hilar, or axillary lymph nodes. Heterogeneous, multinodular thyroid. Trachea, and esophagus demonstrate no significant findings. Lungs/Pleura: Interval enlargement of a large mass centered at the periphery of the right upper lobe, now measuring at least 8.4 x 6.7 cm, previously 7.5 x 5.5 cm when measured similarly (series 2, image 51). Severe centrilobular emphysema with large bullae at the lung basis. No pleural effusion or pneumothorax. Upper Abdomen: No acute abnormality. Musculoskeletal: Bony destruction of the right third and fourth ribs, substantially increased compared to prior examination, corresponding to the enlarged mass. IMPRESSION: 1. Interval enlargement of a large mass centered at the periphery of the right upper lobe and extending into the adjacent chest wall, now measuring at least 8.4 x 6.7 cm, previously 7.5 x 5.5 cm when measured similarly. Bony destruction of the right third and fourth ribs, substantially increased compared to prior examination, corresponding to the enlarged mass. Findings are consistent with worsened malignancy. 2. Severe emphysema. 3. Coronary artery disease. 4. Enlarged, multinodular thyroid. In the setting of significant comorbidities or limited life expectancy, no follow-up recommended (ref: J Am Coll Radiol. 2015 Feb;12(2): 143-50). Aortic Atherosclerosis (ICD10-I70.0) and Emphysema (ICD10-J43.9). Electronically Signed   By: Eddie Candle M.D.   On: 03/13/2021 12:04     ASSESSMENT/PLAN:  This is a very pleasant 87 year old African-American male diagnosed with a stage IVa (T4, N3,  M1 B) non-small cell lung cancer, adenosquamous carcinoma in March 2022 and presented with large mass involving the lateral right upper lobe and right chest wall soft tissue in addition to right hilar, mediastinal and right subpectoral and supraclavicular lymphadenopathy with PD-L1 expression of 90%.  The patient is status post a course of concurrent chemoradiation with carboplatin for an AUC of 2 and paclitaxel 45 mg per metered square.  He is status post 6 cycles.  The patient had evidence of disease progression following concurrent chemoradiation.  The patient is currently undergoing single agent immunotherapy with Libtayo IV every 3 weeks due to his PD-L1 expression of 90%.  The patient is status post his first cycle and tolerated it fairly well.   Labs were reviewed.  He has baseline CKD.  However, his creatinine is slightly elevated compared to his baseline.  The patient has poor oral intake and has lost a significant amount of weight.  We will arrange for the patient receive additional IV fluids while in the infusion room today.  Otherwise, the patient will proceed with cycle #2 today scheduled.  Discussed the  importance of good nutrition with the patient and strongly encouraged to increase his calorie intake.  I also recommend for him to drink supplemental drinks.  I will place a referral to member the nutritionist team for evaluation as 15 pounds is a significant amount of weight loss for this patient. He reports taste alterations. I encouraged him to use biotene and salt water rinse.   The patient is having some dysphagia.  No significant thrush on exam today.  I reviewed this with Dr. Julien Nordmann. Recommend a referral to gastroenterology for further evaluation and recommendation for his dysphagia.   We will see him back for follow-up visit in 3 weeks for evaluation before starting cycle #3.  The patient was advised to call immediately if he has any concerning symptoms in the interval. The  patient voices understanding of current disease status and treatment options and is in agreement with the current care plan. All questions were answered. The patient knows to call the clinic with any problems, questions or concerns. We can certainly see the patient much sooner if necessary    Orders Placed This Encounter  Procedures   Ambulatory Referral to [facility] Nutrition    Referral Priority:   Routine    Referral Type:   Consultation    Referral Reason:   Specialty Services Required    Number of Visits Requested:   1   Ambulatory referral to Gastroenterology    Referral Priority:   Routine    Referral Type:   Consultation    Referral Reason:   Specialty Services Required    Number of Visits Requested:   1     The total time spent in the appointment was 30-39 minutes.   Catheleen Langhorne L Shahrukh Pasch, PA-C 04/10/21

## 2021-04-10 ENCOUNTER — Inpatient Hospital Stay: Payer: Medicare HMO

## 2021-04-10 ENCOUNTER — Other Ambulatory Visit: Payer: Self-pay

## 2021-04-10 ENCOUNTER — Encounter: Payer: Self-pay | Admitting: Physician Assistant

## 2021-04-10 ENCOUNTER — Inpatient Hospital Stay: Payer: Medicare HMO | Attending: Internal Medicine | Admitting: Physician Assistant

## 2021-04-10 VITALS — BP 106/74 | HR 74 | Temp 97.0°F | Resp 18 | Ht 74.0 in | Wt 128.2 lb

## 2021-04-10 DIAGNOSIS — Z79899 Other long term (current) drug therapy: Secondary | ICD-10-CM | POA: Diagnosis not present

## 2021-04-10 DIAGNOSIS — C778 Secondary and unspecified malignant neoplasm of lymph nodes of multiple regions: Secondary | ICD-10-CM | POA: Insufficient documentation

## 2021-04-10 DIAGNOSIS — C7989 Secondary malignant neoplasm of other specified sites: Secondary | ICD-10-CM | POA: Diagnosis not present

## 2021-04-10 DIAGNOSIS — C3411 Malignant neoplasm of upper lobe, right bronchus or lung: Secondary | ICD-10-CM

## 2021-04-10 DIAGNOSIS — Z5112 Encounter for antineoplastic immunotherapy: Secondary | ICD-10-CM

## 2021-04-10 DIAGNOSIS — E860 Dehydration: Secondary | ICD-10-CM | POA: Diagnosis not present

## 2021-04-10 DIAGNOSIS — R1310 Dysphagia, unspecified: Secondary | ICD-10-CM | POA: Diagnosis not present

## 2021-04-10 LAB — CMP (CANCER CENTER ONLY)
ALT: 47 U/L — ABNORMAL HIGH (ref 0–44)
AST: 23 U/L (ref 15–41)
Albumin: 2.2 g/dL — ABNORMAL LOW (ref 3.5–5.0)
Alkaline Phosphatase: 71 U/L (ref 38–126)
Anion gap: 7 (ref 5–15)
BUN: 55 mg/dL — ABNORMAL HIGH (ref 8–23)
CO2: 27 mmol/L (ref 22–32)
Calcium: 8.9 mg/dL (ref 8.9–10.3)
Chloride: 102 mmol/L (ref 98–111)
Creatinine: 2.23 mg/dL — ABNORMAL HIGH (ref 0.61–1.24)
GFR, Estimated: 28 mL/min — ABNORMAL LOW (ref >60)
Glucose, Bld: 95 mg/dL (ref 70–99)
Potassium: 5 mmol/L (ref 3.5–5.1)
Sodium: 136 mmol/L (ref 135–145)
Total Bilirubin: 0.3 mg/dL (ref 0.3–1.2)
Total Protein: 7.7 g/dL (ref 6.5–8.1)

## 2021-04-10 LAB — CBC WITH DIFFERENTIAL (CANCER CENTER ONLY)
Abs Immature Granulocytes: 0.01 10*3/uL (ref 0.00–0.07)
Basophils Absolute: 0 10*3/uL (ref 0.0–0.1)
Basophils Relative: 1 %
Eosinophils Absolute: 0.1 10*3/uL (ref 0.0–0.5)
Eosinophils Relative: 3 %
HCT: 29.1 % — ABNORMAL LOW (ref 39.0–52.0)
Hemoglobin: 9.2 g/dL — ABNORMAL LOW (ref 13.0–17.0)
Immature Granulocytes: 0 %
Lymphocytes Relative: 20 %
Lymphs Abs: 0.6 10*3/uL — ABNORMAL LOW (ref 0.7–4.0)
MCH: 30.9 pg (ref 26.0–34.0)
MCHC: 31.6 g/dL (ref 30.0–36.0)
MCV: 97.7 fL (ref 80.0–100.0)
Monocytes Absolute: 0.3 10*3/uL (ref 0.1–1.0)
Monocytes Relative: 9 %
Neutro Abs: 2.1 10*3/uL (ref 1.7–7.7)
Neutrophils Relative %: 67 %
Platelet Count: 100 10*3/uL — ABNORMAL LOW (ref 150–400)
RBC: 2.98 MIL/uL — ABNORMAL LOW (ref 4.22–5.81)
RDW: 17.5 % — ABNORMAL HIGH (ref 11.5–15.5)
WBC Count: 3.2 10*3/uL — ABNORMAL LOW (ref 4.0–10.5)
nRBC: 0 % (ref 0.0–0.2)

## 2021-04-10 LAB — TSH: TSH: 2.859 u[IU]/mL (ref 0.320–4.118)

## 2021-04-10 MED ORDER — SODIUM CHLORIDE 0.9 % IV SOLN
350.0000 mg | Freq: Once | INTRAVENOUS | Status: AC
  Administered 2021-04-10: 350 mg via INTRAVENOUS
  Filled 2021-04-10: qty 7

## 2021-04-10 MED ORDER — SODIUM CHLORIDE 0.9 % IV SOLN
Freq: Once | INTRAVENOUS | Status: DC
  Filled 2021-04-10: qty 250

## 2021-04-10 MED ORDER — SODIUM CHLORIDE 0.9 % IV SOLN
Freq: Once | INTRAVENOUS | Status: AC
Start: 2021-04-10 — End: 2021-04-10
  Filled 2021-04-10: qty 250

## 2021-04-10 NOTE — Patient Instructions (Signed)
[facility] ONCOL[facility]  Discharge Instructions: Thank you for choosing [facility] to provide your oncology and hematology care.   If you have a lab appointment with the [facility], please go directly to the [facility] and check in at the registration area.   Wear comfortable clothing and clothing appropriate for easy access to any Portacath or PICC line.   We strive to give you quality time with your provider. You may need to reschedule your appointment if you arrive late (15 or more minutes).  Arriving late affects you and other patients whose appointments are after yours.  Also, if you miss three or more appointments without notifying the office, you may be dismissed from the clinic at the provider's discretion.      For prescription refill requests, have your pharmacy contact our office and allow 72 hours for refills to be completed.    Today you received the following chemotherapy and/or immunotherapy agents : Libtayo   To help prevent nausea and vomiting after your treatment, we encourage you to take your nausea medication as directed.  BELOW ARE SYMPTOMS THAT SHOULD BE REPORTED IMMEDIATELY: *FEVER GREATER THAN 100.4 F (38 C) OR HIGHER *CHILLS OR SWEATING *NAUSEA AND VOMITING THAT IS NOT CONTROLLED WITH YOUR NAUSEA MEDICATION *UNUSUAL SHORTNESS OF BREATH *UNUSUAL BRUISING OR BLEEDING *URINARY PROBLEMS (pain or burning when urinating, or frequent urination) *BOWEL PROBLEMS (unusual diarrhea, constipation, pain near the anus) TENDERNESS IN MOUTH AND THROAT WITH OR WITHOUT PRESENCE OF ULCERS (sore throat, sores in mouth, or a toothache) UNUSUAL RASH, SWELLING OR PAIN  UNUSUAL VAGINAL DISCHARGE OR ITCHING   Items with * indicate a potential emergency and should be followed up as soon as possible or go to the Emergency Department if any problems should occur.  Please show the CHEMOTHERAPY ALERT CARD or IMMUNOTHERAPY ALERT CARD at check-in to the  Emergency Department and triage nurse.  Should you have questions after your visit or need to cancel or reschedule your appointment, please contact [facility]  Dept: [phone]  and follow the prompts.  Office hours are 8:00 a.m. to 4:30 p.m. Monday - Friday. Please note that voicemails left after 4:00 p.m. may not be returned until the following business day.  We are closed weekends and major holidays. You have access to a nurse at all times for urgent questions. Please call the main number to the clinic Dept: [phone] and follow the prompts.   For any non-urgent questions, you may also contact your provider using MyChart. We now offer e-Visits for anyone 85 and older to request care online for non-urgent symptoms. For details visit [email or web address].   Also download the MyChart app! Go to the app store, search "MyChart", open the app, select [facility], and log in with your MyChart username and password.  Due to Covid, a mask is required upon entering the hospital/clinic. If you do not have a mask, one will be given to you upon arrival. For doctor visits, patients may have 1 support person aged 25 or older with them. For treatment visits, patients cannot have anyone with them due to current Covid guidelines and our immunocompromised population.

## 2021-04-10 NOTE — Progress Notes (Signed)
Okay to treat with elevated creat per Cassie PA

## 2021-04-28 DIAGNOSIS — I10 Essential (primary) hypertension: Secondary | ICD-10-CM | POA: Insufficient documentation

## 2021-04-28 DIAGNOSIS — N400 Enlarged prostate without lower urinary tract symptoms: Secondary | ICD-10-CM | POA: Insufficient documentation

## 2021-04-28 DIAGNOSIS — M170 Bilateral primary osteoarthritis of knee: Secondary | ICD-10-CM | POA: Diagnosis not present

## 2021-04-28 DIAGNOSIS — E039 Hypothyroidism, unspecified: Secondary | ICD-10-CM | POA: Insufficient documentation

## 2021-04-28 DIAGNOSIS — C3490 Malignant neoplasm of unspecified part of unspecified bronchus or lung: Secondary | ICD-10-CM | POA: Diagnosis not present

## 2021-04-28 DIAGNOSIS — N401 Enlarged prostate with lower urinary tract symptoms: Secondary | ICD-10-CM | POA: Diagnosis not present

## 2021-04-28 DIAGNOSIS — R636 Underweight: Secondary | ICD-10-CM | POA: Diagnosis not present

## 2021-04-28 DIAGNOSIS — Z0189 Encounter for other specified special examinations: Secondary | ICD-10-CM | POA: Diagnosis not present

## 2021-04-28 DIAGNOSIS — Z742 Need for assistance at home and no other household member able to render care: Secondary | ICD-10-CM | POA: Diagnosis not present

## 2021-04-28 DIAGNOSIS — M7989 Other specified soft tissue disorders: Secondary | ICD-10-CM | POA: Diagnosis not present

## 2021-04-28 DIAGNOSIS — C61 Malignant neoplasm of prostate: Secondary | ICD-10-CM | POA: Insufficient documentation

## 2021-04-28 DIAGNOSIS — G894 Chronic pain syndrome: Secondary | ICD-10-CM | POA: Insufficient documentation

## 2021-05-01 ENCOUNTER — Inpatient Hospital Stay: Payer: Medicare HMO | Attending: Internal Medicine | Admitting: Internal Medicine

## 2021-05-01 ENCOUNTER — Other Ambulatory Visit: Payer: Self-pay

## 2021-05-01 ENCOUNTER — Inpatient Hospital Stay: Payer: Medicare HMO

## 2021-05-01 ENCOUNTER — Ambulatory Visit: Payer: Medicare HMO | Admitting: Nutrition

## 2021-05-01 ENCOUNTER — Encounter: Payer: Self-pay | Admitting: Internal Medicine

## 2021-05-01 VITALS — BP 129/66 | HR 73 | Temp 97.9°F | Resp 19 | Ht 74.0 in | Wt 134.3 lb

## 2021-05-01 DIAGNOSIS — C778 Secondary and unspecified malignant neoplasm of lymph nodes of multiple regions: Secondary | ICD-10-CM | POA: Insufficient documentation

## 2021-05-01 DIAGNOSIS — C3490 Malignant neoplasm of unspecified part of unspecified bronchus or lung: Secondary | ICD-10-CM

## 2021-05-01 DIAGNOSIS — C7989 Secondary malignant neoplasm of other specified sites: Secondary | ICD-10-CM | POA: Diagnosis not present

## 2021-05-01 DIAGNOSIS — Z5112 Encounter for antineoplastic immunotherapy: Secondary | ICD-10-CM | POA: Insufficient documentation

## 2021-05-01 DIAGNOSIS — C3411 Malignant neoplasm of upper lobe, right bronchus or lung: Secondary | ICD-10-CM

## 2021-05-01 DIAGNOSIS — Z79899 Other long term (current) drug therapy: Secondary | ICD-10-CM | POA: Diagnosis not present

## 2021-05-01 LAB — CMP (CANCER CENTER ONLY)
ALT: 24 U/L (ref 0–44)
AST: 15 U/L (ref 15–41)
Albumin: 2.4 g/dL — ABNORMAL LOW (ref 3.5–5.0)
Alkaline Phosphatase: 74 U/L (ref 38–126)
Anion gap: 8 (ref 5–15)
BUN: 47 mg/dL — ABNORMAL HIGH (ref 8–23)
CO2: 25 mmol/L (ref 22–32)
Calcium: 8.7 mg/dL — ABNORMAL LOW (ref 8.9–10.3)
Chloride: 106 mmol/L (ref 98–111)
Creatinine: 2.14 mg/dL — ABNORMAL HIGH (ref 0.61–1.24)
GFR, Estimated: 30 mL/min — ABNORMAL LOW (ref >60)
Glucose, Bld: 98 mg/dL (ref 70–99)
Potassium: 4.8 mmol/L (ref 3.5–5.1)
Sodium: 139 mmol/L (ref 135–145)
Total Bilirubin: 0.3 mg/dL (ref 0.3–1.2)
Total Protein: 7 g/dL (ref 6.5–8.1)

## 2021-05-01 LAB — CBC WITH DIFFERENTIAL (CANCER CENTER ONLY)
Abs Immature Granulocytes: 0.01 10*3/uL (ref 0.00–0.07)
Basophils Absolute: 0 10*3/uL (ref 0.0–0.1)
Basophils Relative: 0 %
Eosinophils Absolute: 0.1 10*3/uL (ref 0.0–0.5)
Eosinophils Relative: 3 %
HCT: 29.7 % — ABNORMAL LOW (ref 39.0–52.0)
Hemoglobin: 9.4 g/dL — ABNORMAL LOW (ref 13.0–17.0)
Immature Granulocytes: 0 %
Lymphocytes Relative: 15 %
Lymphs Abs: 0.5 10*3/uL — ABNORMAL LOW (ref 0.7–4.0)
MCH: 32.5 pg (ref 26.0–34.0)
MCHC: 31.6 g/dL (ref 30.0–36.0)
MCV: 102.8 fL — ABNORMAL HIGH (ref 80.0–100.0)
Monocytes Absolute: 0.3 10*3/uL (ref 0.1–1.0)
Monocytes Relative: 10 %
Neutro Abs: 2.3 10*3/uL (ref 1.7–7.7)
Neutrophils Relative %: 72 %
Platelet Count: 118 10*3/uL — ABNORMAL LOW (ref 150–400)
RBC: 2.89 MIL/uL — ABNORMAL LOW (ref 4.22–5.81)
RDW: 16.6 % — ABNORMAL HIGH (ref 11.5–15.5)
WBC Count: 3.2 10*3/uL — ABNORMAL LOW (ref 4.0–10.5)
nRBC: 0 % (ref 0.0–0.2)

## 2021-05-01 LAB — TSH: TSH: 2.012 u[IU]/mL (ref 0.320–4.118)

## 2021-05-01 MED ORDER — SODIUM CHLORIDE 0.9 % IV SOLN
350.0000 mg | Freq: Once | INTRAVENOUS | Status: AC
Start: 2021-05-01 — End: 2021-05-01
  Administered 2021-05-01: 350 mg via INTRAVENOUS
  Filled 2021-05-01: qty 7

## 2021-05-01 MED ORDER — SODIUM CHLORIDE 0.9 % IV SOLN
Freq: Once | INTRAVENOUS | Status: AC
  Filled 2021-05-01: qty 250

## 2021-05-01 NOTE — Progress Notes (Signed)
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  Biomarker Findings Tumor Mutational Burden - 10 Muts/Mb Microsatellite status - MS-Stable Genomic Findings For a complete list of the genes assayed, please refer to the Appendix. KRAS G12C NFKBIA amplification NKX2-1 amplification RBM10 D658f*80 7 Disease relevant genes with no reportable alterations: ALK, BRAF, EGFR, ERBB2, MET, RET, ROS1   PDL1: 90%   PRIOR THERAPY:  Weekly concurrent chemoradiation with carboplatin for an AUC of 2 and paclitaxel 45 mg/m.  First dose on 12/30/2020.  Status post 6 cycles.   CURRENT THERAPY: First-line treatment with immunotherapy with Libtayo (Cempilimab) 350 Mg IV every 3 weeks.  Status post 2 cycles.  INTERVAL HISTORY: Brian [id number]. male returns to the clinic today for follow-up visit accompanied by his son.  The patient is feeling fine today with no concerning complaints except for arthralgia of the knees as well as shortness of breath with exertion.  He denied having any current chest pain, cough or hemoptysis.  He denied having any fever or chills.  He has no nausea, vomiting, diarrhea or constipation.  He denied having any significant weight loss or night sweats.  He continues to tolerate his treatment with Libtayo (Cempilimab) fairly well.  He denied having any headache or visual changes.  He is here today for evaluation before starting cycle #3.  MEDICAL HISTORY: Past Medical History:  Diagnosis Date   Arthritis    Hypertension    Hyperthyroidism     ALLERGIES:   has No Known Allergies.  MEDICATIONS:  Current Outpatient Medications  Medication Sig Dispense Refill   gabapentin (NEURONTIN) 250 MG/5ML solution Take 3 mLs (150 mg total) by mouth at bedtime. 90 mL 2   labetalol (NORMODYNE) 200 MG tablet Take 200 mg by mouth 2 (two) times daily.     levothyroxine (SYNTHROID) 150 MCG tablet Take 150 mcg by mouth daily before breakfast.     naproxen (NAPROSYN) 500 MG tablet Take 500 mg by mouth 2 (two) times daily as needed for moderate pain.     Olmesartan-Amlodipine-HCTZ 40-10-12.5 MG TABS Take 1 tablet by mouth daily.     oxyCODONE-acetaminophen (PERCOCET/ROXICET) 5-325 MG tablet Take 1 tablet by mouth every 8 (eight) hours as needed for severe pain. 30 tablet 0   prochlorperazine (COMPAZINE) 10 MG tablet Take 1 tablet (10 mg total) by mouth every 6 (six) hours as needed for nausea or vomiting. 30 tablet 0   tadalafil (CIALIS) 5 MG tablet Take 5 mg by mouth daily.     tamsulosin (FLOMAX) 0.4 MG CAPS capsule Take 0.4 mg by mouth daily.     No current facility-administered medications for this visit.    SURGICAL HISTORY:  Past Surgical History:  Procedure Laterality Date   COLONOSCOPY N/A 09/02/2016   Procedure: COLONOSCOPY;  Surgeon: NRogene Houston MD;  [facility]: [facility];  Service: Endoscopy;  Laterality: N/A;  830   NO PAST SURGERIES     POLYPECTOMY  09/02/2016   Procedure: POLYPECTOMY;  Surgeon: NRogene Houston MD;  Location: [facility];  Service: Endoscopy;;    REVIEW OF SYSTEMS:  A comprehensive review of systems was negative except for: Constitutional: positive for fatigue Respiratory: positive for dyspnea on exertion Musculoskeletal: positive for arthralgias   PHYSICAL EXAMINATION: General appearance: alert, cooperative, fatigued, and no distress Head: Normocephalic, without obvious abnormality, atraumatic Neck: no adenopathy, no JVD, supple, symmetrical, trachea midline, and thyroid not enlarged, symmetric, no  tenderness/mass/nodules Lymph nodes: Cervical, supraclavicular, and axillary nodes normal. Resp: clear to auscultation bilaterally Back: symmetric, no curvature. ROM normal. No CVA tenderness. Cardio: regular rate and rhythm, S1, S2 normal, no murmur, click, rub or gallop GI: soft, non-tender; bowel sounds normal; no masses,  no organomegaly Extremities: extremities normal, atraumatic, no cyanosis or edema  ECOG PERFORMANCE STATUS: 1 - Symptomatic but completely ambulatory  Blood pressure 129/66, pulse 73, temperature 97.9 F (36.6 C), temperature source Tympanic, resp. rate 19, height _0  (1.88 m), weight 134 lb 4.8 oz (60.9 kg), SpO2 95 %.  LABORATORY DATA: Lab Results  Component Value Date   WBC 3.2 (L) 05/01/2021   HGB 9.4 (L) 05/01/2021   HCT 29.7 (L) 05/01/2021   MCV 102.8 (H) 05/01/2021   PLT 118 (L) 05/01/2021      Chemistry      Component Value Date/Time   NA 136 04/10/2021 1050   K 5.0 04/10/2021 1050   CL 102 04/10/2021 1050   CO2 27 04/10/2021 1050   BUN 55 (H) 04/10/2021 1050   CREATININE 2.23 (H) 04/10/2021 1050      Component Value Date/Time   CALCIUM 8.9 04/10/2021 1050   ALKPHOS 71 04/10/2021 1050   AST 23 04/10/2021 1050   ALT 47 (H) 04/10/2021 1050   BILITOT 0.3 04/10/2021 1050       RADIOGRAPHIC STUDIES: No results found.  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  The patient underwent a course of concurrent chemoradiation with weekly carboplatin for AUC of 2 and paclitaxel 45 Mg/M2 status post 6 cycles. He tolerated the previous course of his concurrent chemoradiation  fairly well except for fatigue. Unfortunately his CT scan of the chest after the induction phase showed interval enlargement of a large mass centered at the periphery of the right upper lobe and extending into the adjacent chest wall measuring 8.4 x 6.7 cm with bony destruction of the right third and fourth ribs substantially increased compared to the prior examination and the findings are consistent with worsened malignancy. The patient has PD-L1 expression of 90% and I felt he will be a good candidate for treatment with first-line single agent immunotherapy. I recommended for him treatment with Libtayo (Cempilimab) 350 Mg IV every 3 weeks.  He is status post 2 cycles of treatment. The patient has been tolerating this treatment well with no concerning adverse effects. I recommended for him to proceed with cycle #3 today as planned. I will see him back for follow-up visit in 3 weeks for evaluation with repeat CT scan of the chest for restaging of his disease. He was advised to call immediately if he has any other concerning symptoms in the interval. The patient voices understanding of current disease status and treatment options and is in agreement with the current care plan.  All  questions were answered. The patient knows to call the clinic with any problems, questions or concerns. We can certainly see the patient much sooner if necessary.   Disclaimer: This note was dictated with voice recognition software. Similar sounding words can inadvertently be transcribed and may not be corrected upon review.

## 2021-05-01 NOTE — Progress Notes (Signed)
Nutrition follow up completed with patient during infusion. Weight improved to 134.3 pounds from 128.2 pounds on July 14 but decreased from 143.3 pounds on June 16.  Patient reports swollen feet which impact weight. He is receiving immunotherapy. He denies nutrition impact symptoms and says he is eating better than he was last visit. He drinks ONS and likes chocolate and strawberry flavors.  Provided samples and coupons. Patient declines nutrition education needs at this time. If status changes, please refer back to RD.

## 2021-05-01 NOTE — Patient Instructions (Signed)
[facility]  Discharge Instructions: Thank you for choosing [facility] to provide your oncology and hematology care.   If you have a lab appointment with the [facility], please go directly to the [facility] and check in at the registration area.   Wear comfortable clothing and clothing appropriate for easy access to any Portacath or PICC line.   We strive to give you quality time with your provider. You may need to reschedule your appointment if you arrive late (15 or more minutes).  Arriving late affects you and other patients whose appointments are after yours.  Also, if you miss three or more appointments without notifying the office, you may be dismissed from the clinic at the provider's discretion.      For prescription refill requests, have your pharmacy contact our office and allow 72 hours for refills to be completed.    Today you received the following chemotherapy and/or immunotherapy agents Cemiplimab-rwlc (Libtayo)      To help prevent nausea and vomiting after your treatment, we encourage you to take your nausea medication as directed.  BELOW ARE SYMPTOMS THAT SHOULD BE REPORTED IMMEDIATELY: *FEVER GREATER THAN 100.4 F (38 C) OR HIGHER *CHILLS OR SWEATING *NAUSEA AND VOMITING THAT IS NOT CONTROLLED WITH YOUR NAUSEA MEDICATION *UNUSUAL SHORTNESS OF BREATH *UNUSUAL BRUISING OR BLEEDING *URINARY PROBLEMS (pain or burning when urinating, or frequent urination) *BOWEL PROBLEMS (unusual diarrhea, constipation, pain near the anus) TENDERNESS IN MOUTH AND THROAT WITH OR WITHOUT PRESENCE OF ULCERS (sore throat, sores in mouth, or a toothache) UNUSUAL RASH, SWELLING OR PAIN  UNUSUAL VAGINAL DISCHARGE OR ITCHING   Items with * indicate a potential emergency and should be followed up as soon as possible or go to the Emergency Department if any problems should occur.  Please show the CHEMOTHERAPY ALERT CARD or IMMUNOTHERAPY ALERT CARD  at check-in to the Emergency Department and triage nurse.  Should you have questions after your visit or need to cancel or reschedule your appointment, please contact [facility]  Dept: [phone]  and follow the prompts.  Office hours are 8:00 a.m. to 4:30 p.m. Monday - Friday. Please note that voicemails left after 4:00 p.m. may not be returned until the following business day.  We are closed weekends and major holidays. You have access to a nurse at all times for urgent questions. Please call the main number to the clinic Dept: [phone] and follow the prompts.   For any non-urgent questions, you may also contact your provider using MyChart. We now offer e-Visits for anyone 39 and older to request care online for non-urgent symptoms. For details visit [email or web address].   Also download the MyChart app! Go to the app store, search "MyChart", open the app, select [facility], and log in with your MyChart username and password.  Due to Covid, a mask is required upon entering the hospital/clinic. If you do not have a mask, one will be given to you upon arrival. For doctor visits, patients may have 1 support person aged 51 or older with them. For treatment visits, patients cannot have anyone with them due to current Covid guidelines and our immunocompromised population.

## 2021-05-01 NOTE — Progress Notes (Signed)
Per Dr. Julien Nordmann, "OK To Treat with elevated Cr+2.14 and BUN of 47".

## 2021-05-05 DIAGNOSIS — C3490 Malignant neoplasm of unspecified part of unspecified bronchus or lung: Secondary | ICD-10-CM | POA: Diagnosis not present

## 2021-05-05 DIAGNOSIS — Z681 Body mass index (BMI) 19 or less, adult: Secondary | ICD-10-CM | POA: Diagnosis not present

## 2021-05-05 DIAGNOSIS — C3492 Malignant neoplasm of unspecified part of left bronchus or lung: Secondary | ICD-10-CM | POA: Diagnosis not present

## 2021-05-05 DIAGNOSIS — N401 Enlarged prostate with lower urinary tract symptoms: Secondary | ICD-10-CM | POA: Diagnosis not present

## 2021-05-05 DIAGNOSIS — E039 Hypothyroidism, unspecified: Secondary | ICD-10-CM | POA: Diagnosis not present

## 2021-05-05 DIAGNOSIS — R636 Underweight: Secondary | ICD-10-CM | POA: Diagnosis not present

## 2021-05-05 DIAGNOSIS — Z8546 Personal history of malignant neoplasm of prostate: Secondary | ICD-10-CM | POA: Diagnosis not present

## 2021-05-05 DIAGNOSIS — G894 Chronic pain syndrome: Secondary | ICD-10-CM | POA: Diagnosis not present

## 2021-05-05 DIAGNOSIS — M170 Bilateral primary osteoarthritis of knee: Secondary | ICD-10-CM | POA: Diagnosis not present

## 2021-05-05 DIAGNOSIS — I10 Essential (primary) hypertension: Secondary | ICD-10-CM | POA: Diagnosis not present

## 2021-05-20 ENCOUNTER — Encounter (HOSPITAL_COMMUNITY): Payer: Self-pay

## 2021-05-20 ENCOUNTER — Ambulatory Visit (HOSPITAL_COMMUNITY)
Admission: RE | Admit: 2021-05-20 | Discharge: 2021-05-20 | Disposition: A | Discharge status: Home / Self Care | Payer: Medicare HMO | Source: Ambulatory Visit | Attending: Internal Medicine | Admitting: Internal Medicine

## 2021-05-20 ENCOUNTER — Other Ambulatory Visit: Payer: Self-pay

## 2021-05-20 DIAGNOSIS — C3490 Malignant neoplasm of unspecified part of unspecified bronchus or lung: Secondary | ICD-10-CM | POA: Insufficient documentation

## 2021-05-20 DIAGNOSIS — I700 Atherosclerosis of aorta: Secondary | ICD-10-CM | POA: Diagnosis not present

## 2021-05-20 DIAGNOSIS — J90 Pleural effusion, not elsewhere classified: Secondary | ICD-10-CM | POA: Diagnosis not present

## 2021-05-21 ENCOUNTER — Other Ambulatory Visit: Payer: Self-pay | Admitting: Internal Medicine

## 2021-05-21 NOTE — Progress Notes (Signed)
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   Biomarker Findings Tumor Mutational Burden - 10 Muts/Mb Microsatellite status - MS-Stable Genomic Findings For a complete list of the genes assayed, please refer to the Appendix. KRAS G12C NFKBIA amplification NKX2-1 amplification RBM10 D668f*80 7 Disease relevant genes with no reportable alterations: ALK, BRAF, EGFR, ERBB2, MET, RET, ROS1   PDL1: 90%  PRIOR THERAPY: Weekly concurrent chemoradiation with carboplatin for an AUC of 2 and paclitaxel 45 mg/m.  First dose on 12/30/2020.  Status post 6 cycles.  CURRENT THERAPY: First-line treatment with immunotherapy with Libtayo (Cempilimab) 350 Mg IV every 3 weeks. First dose on 03/19/21. Status post 3 cycles.   INTERVAL HISTORY: [name].o. male returns to the clinic today for a follow-up visit accompanied by his son in law.  The patient is status 3 cycles of immunotherapy and tolerated it fairly well without any concerning adverse side effects. He denies fevers, chills, or night sweats. He denies any night sweats.  He saw nutrition at his last appointment due to a previous significant weight loss. He declined nutrition education. He states his appetite has gotten better compared to prior. He gained a few pounds. He denies any nausea, vomiting, or constipation.  He sometimes has diarrhea "once in awhile". He reports stable fatigue and dyspnea on exertion. He has some right sided chest discomfort in the morning that resolves with coughing up phlegm and then resolves spontaneously. Of note, he does have some right sided chest  wall invasion from his malignancy. He denies any headache or visual changes.  He denies any hemoptysis. As previously mentioned, he has a cough just in the morning when he gets up. He also notes some post nasal drainage. He denies rashes or skin changes. He recently had a restaging CT scan performed. The patient is here today for evaluation and to review his scan before starting cycle #3.   MEDICAL HISTORY: Past Medical History:  Diagnosis Date   Arthritis    Hypertension    Hyperthyroidism    lung ca 11/2020    ALLERGIES:  has No Known Allergies.  MEDICATIONS:  Current Outpatient Medications  Medication Sig Dispense Refill   gabapentin (NEURONTIN) 250 MG/5ML solution Take 3 mLs (150 mg total) by mouth at bedtime. 90 mL 2   labetalol (NORMODYNE) 200 MG tablet Take 200 mg by mouth 2 (two) times daily.     levothyroxine (SYNTHROID) 150 MCG tablet Take 150 mcg by mouth daily before breakfast.     naproxen (NAPROSYN) 500 MG tablet Take 500 mg by mouth 2 (two) times daily as needed for moderate pain.     Olmesartan-Amlodipine-HCTZ 40-10-12.5 MG TABS Take 1 tablet by mouth daily.     oxyCODONE-acetaminophen (PERCOCET/ROXICET) 5-325 MG tablet Take 1 tablet by mouth every 8 (eight) hours as needed for severe pain. 30 tablet 0   prochlorperazine (COMPAZINE) 10 MG tablet Take 1 tablet (10 mg total) by mouth every 6 (six) hours as needed for nausea or vomiting. 30 tablet 0   tadalafil (CIALIS) 5 MG tablet Take 5 mg by mouth daily.     tamsulosin (FLOMAX) 0.4 MG CAPS capsule Take  0.4 mg by mouth daily.     No current facility-administered medications for this visit.   Facility-Administered Medications Ordered in Other Visits  Medication Dose Route Frequency Provider Last Rate Last Admin   cemiplimab-rwlc (LIBTAYO) 350 mg in sodium chloride 0.9 % 100 mL chemo infusion  350 mg Intravenous Once Curt Bears, MD        SURGICAL HISTORY:  Past Surgical History:  Procedure Laterality Date    COLONOSCOPY N/A 09/02/2016   Procedure: COLONOSCOPY;  Surgeon: Rogene Houston, MD;  [facility];  Service: Endoscopy;  Laterality: N/A;  830   NO PAST SURGERIES     POLYPECTOMY  09/02/2016   Procedure: POLYPECTOMY;  Surgeon: Rogene Houston, MD;  [facility]: [facility];  Service: Endoscopy;;    REVIEW OF SYSTEMS:   Review of Systems  Constitutional: Positive for stable fatigue. Negative for chills, decrease appetite, weight loss, and fever. HENT:  Negative for mouth sores, nosebleeds, or sore throat. Eyes: Negative for eye problems and icterus.  Respiratory: Positive for stable to improved shortness of breath with exertion. Cough in the morning. Negative for hemoptysis and wheezing.   Cardiovascular: Positive for right sided chest discomfort in the AM that resolves with coughing. Negative for leg swelling.  Gastrointestinal: Positive for mild diarrhea. Negative for abdominal pain, constipation, nausea and vomiting.  Genitourinary: Negative for bladder incontinence, difficulty urinating, dysuria, frequency and hematuria.   Musculoskeletal: Negative for back pain, gait problem, neck pain and neck stiffness.  Skin: Negative for itching and rash.  Neurological: Negative for dizziness, extremity weakness, gait problem, headaches, light-headedness and seizures.  Hematological: Negative for adenopathy. Does not bruise/bleed easily.  Psychiatric/Behavioral: Negative for confusion, depression and sleep disturbance. The patient is not nervous/anxious.     PHYSICAL EXAMINATION:  Blood pressure 119/74, pulse 67, temperature (!) 96.5 F (35.8 C), temperature source Oral, resp. rate 18, weight 137 lb 1.6 oz (62.2 kg), SpO2 97 %.  ECOG PERFORMANCE STATUS: 2  Physical Exam  Constitutional: Oriented to person, place, and time and thin appearing male and in no distress.  HENT:  Head: Normocephalic and atraumatic.  Mouth/Throat: Oropharynx is clear and moist. No oropharyngeal exudate.  No  evidence of thrush Eyes: Conjunctivae are normal. Right eye exhibits no discharge. Left eye exhibits no discharge. No scleral icterus.  Neck: Normal range of motion. Neck supple.  Cardiovascular: Normal rate, regular rhythm, normal heart sounds and intact distal pulses.   Pulmonary/Chest: Effort normal and breath sounds normal. No respiratory distress. No wheezes. No rales.  Abdominal: Soft. Bowel sounds are normal. Exhibits no distension and no mass. There is no tenderness.  Musculoskeletal: Normal range of motion. Exhibits no edema.  Lymphadenopathy:    No cervical adenopathy.  Neurological: Alert and oriented to person, place, and time. Exhibits muscle wasting.  The patient was examined in the wheelchair. Skin: Skin is warm and dry. No rash noted. Not diaphoretic. No erythema. No pallor.  Psychiatric: Mood, memory and judgment normal.  Vitals reviewed.  LABORATORY DATA: Lab Results  Component Value Date   WBC 2.7 (L) 05/22/2021   HGB 9.4 (L) 05/22/2021   HCT 28.9 (L) 05/22/2021   MCV 98.6 05/22/2021   PLT 121 (L) 05/22/2021      Chemistry      Component Value Date/Time   NA 135 05/22/2021 1407   K 4.6 05/22/2021 1407   CL 105 05/22/2021 1407   CO2 24 05/22/2021 1407   BUN 46 (H) 05/22/2021 1407  CREATININE 2.27 (H) 05/22/2021 1407      Component Value Date/Time   CALCIUM 8.6 (L) 05/22/2021 1407   ALKPHOS 78 05/22/2021 1407   AST 19 05/22/2021 1407   ALT 32 05/22/2021 1407   BILITOT 0.3 05/22/2021 1407       RADIOGRAPHIC STUDIES:  CT Chest Wo Contrast  Result Date: 05/21/2021 CLINICAL DATA:  Non-small-cell lung cancer.  Restaging. EXAM: CT CHEST WITHOUT CONTRAST TECHNIQUE: Multidetector CT imaging of the chest was performed following the standard protocol without IV contrast. COMPARISON:  03/12/2021 FINDINGS: Cardiovascular: The heart size is normal. No substantial pericardial effusion. Coronary artery calcification is evident. Moderate atherosclerotic calcification  is noted in the wall of the thoracic aorta. Mediastinum/Nodes: Stable multinodular thyroid enlargement upper normal mediastinal lymph nodes are similar to prior. Hilar regions not well evaluated on this noncontrast study. The esophagus has normal imaging features. There is no axillary lymphadenopathy. Lungs/Pleura: Peripheral right upper lobe pulmonary mass has decreased substantially in the interval measuring 6.9 x 4.2 cm on image 54/2 today compared to 8.4 x 6.7 cm previously. Interval increase in consolidative airspace disease in the right lung and right parahilar region is presumably treatment related although infection and recurrent disease could also have this appearance. Tiny bilateral pleural effusions noted. Upper Abdomen: Small hypoattenuating splenic lesion is stable. Probable cysts in both kidneys are similar to prior. Musculoskeletal: No worrisome lytic or sclerotic osseous abnormality. Destruction of the right third rib with involvement of the right fourth rib similar to prior. IMPRESSION: 1. Marked interval decrease in size of the peripheral right upper lobe pulmonary mass. 2. Interval increase in consolidative airspace disease in the right lung and right parahilar region, presumably treatment related although infection and recurrence could also have this appearance. 3. Similar appearance of bony destruction of the right third and fourth ribs. 4. Aortic Atherosclerosis (ICD10-I70.0). Electronically Signed   By: Misty Stanley M.D.   On: 05/21/2021 11:55     ASSESSMENT/PLAN:  This is a very pleasant 87 year old African-American male diagnosed with a stage IVa (T4, N3, M1 B) non-small cell lung cancer, adenosquamous carcinoma in March 2022 and presented with large mass involving the lateral right upper lobe and right chest wall soft tissue in addition to right hilar, mediastinal and right subpectoral and supraclavicular lymphadenopathy with PD-L1 expression of 90%.   The patient is status post a  course of concurrent chemoradiation with carboplatin for an AUC of 2 and paclitaxel 45 mg per metered square.  He is status post 6 cycles.   The patient had evidence of disease progression following concurrent chemoradiation.  The patient is currently undergoing single agent immunotherapy with Libtayo IV every 3 weeks due to his PD-L1 expression of 90%.  The patient is status post 3 cycles.   Labs were reviewed.  He has baseline CKD.  I reviewed his restaging CT scan with Dr. Julien Nordmann. I reviewed the results with the patient. The scan showed improvement in his disease. He will proceed with cycle #4 today as scheduled. consolidative airspace disease in the right lung and right parahilar region, presumably treatment related from his prior radiation.   We will see him back for a follow up visit in 3 weeks for evaluation before starting cycle #4.   For the right sided chest discomfort that resolves with coughing, he does have some rib invasion that is stable in this area. If needed, discussed he can use tylenol. The pain is non-exertional and is right sided which is the  location of the large right lung mass.   The patient was advised to call immediately if he has any concerning symptoms in the interval. The patient voices understanding of current disease status and treatment options and is in agreement with the current care plan. All questions were answered. The patient knows to call the clinic with any problems, questions or concerns. We can certainly see the patient much sooner if necessary       No orders of the defined types were placed in this encounter.     The total time spent in the appointment was 30-39 minutes in this encounter.   Kyera Felan L Scottie Metayer, PA-C 05/22/21

## 2021-05-21 NOTE — Progress Notes (Deleted)
[facility] OFFICE PROGRESS NOTE  [name], MD [address] [place] [address]  DIAGNOSIS: ***  PRIOR THERAPY:  CURRENT THERAPY:  INTERVAL HISTORY: [name] 87 y.o. male returns for *** regular *** visit for followup of ***   MEDICAL HISTORY: Past Medical History:  Diagnosis Date   Arthritis    Hypertension    Hyperthyroidism    lung ca 11/2020    ALLERGIES:  has No Known Allergies.  MEDICATIONS:  Current Outpatient Medications  Medication Sig Dispense Refill   gabapentin (NEURONTIN) 250 MG/5ML solution Take 3 mLs (150 mg total) by mouth at bedtime. 90 mL 2   labetalol (NORMODYNE) 200 MG tablet Take 200 mg by mouth 2 (two) times daily.     levothyroxine (SYNTHROID) 150 MCG tablet Take 150 mcg by mouth daily before breakfast.     naproxen (NAPROSYN) 500 MG tablet Take 500 mg by mouth 2 (two) times daily as needed for moderate pain.     Olmesartan-Amlodipine-HCTZ 40-10-12.5 MG TABS Take 1 tablet by mouth daily.     oxyCODONE-acetaminophen (PERCOCET/ROXICET) 5-325 MG tablet Take 1 tablet by mouth every 8 (eight) hours as needed for severe pain. 30 tablet 0   prochlorperazine (COMPAZINE) 10 MG tablet Take 1 tablet (10 mg total) by mouth every 6 (six) hours as needed for nausea or vomiting. 30 tablet 0   tadalafil (CIALIS) 5 MG tablet Take 5 mg by mouth daily.     tamsulosin (FLOMAX) 0.4 MG CAPS capsule Take 0.4 mg by mouth daily.     No current facility-administered medications for this visit.    SURGICAL HISTORY:  Past Surgical History:  Procedure Laterality Date   COLONOSCOPY N/A 09/02/2016   Procedure: COLONOSCOPY;  Surgeon: Rogene Houston, MD;  [facility];  Service: Endoscopy;  Laterality: N/A;  830   NO PAST SURGERIES     POLYPECTOMY  09/02/2016   Procedure: POLYPECTOMY;  Surgeon: Rogene Houston, MD;  Location: [facility];  Service: Endoscopy;;    REVIEW OF SYSTEMS:   Review of Systems  Constitutional: Negative  for appetite change, chills, fatigue, fever and unexpected weight change.  HENT:   Negative for mouth sores, nosebleeds, sore throat and trouble swallowing.   Eyes: Negative for eye problems and icterus.  Respiratory: Negative for cough, hemoptysis, shortness of breath and wheezing.   Cardiovascular: Negative for chest pain and leg swelling.  Gastrointestinal: Negative for abdominal pain, constipation, diarrhea, nausea and vomiting.  Genitourinary: Negative for bladder incontinence, difficulty urinating, dysuria, frequency and hematuria.   Musculoskeletal: Negative for back pain, gait problem, neck pain and neck stiffness.  Skin: Negative for itching and rash.  Neurological: Negative for dizziness, extremity weakness, gait problem, headaches, light-headedness and seizures.  Hematological: Negative for adenopathy. Does not bruise/bleed easily.  Psychiatric/Behavioral: Negative for confusion, depression and sleep disturbance. The patient is not nervous/anxious.     PHYSICAL EXAMINATION:  There were no vitals taken for this visit.  ECOG PERFORMANCE STATUS: {CHL ONC ECOG [id number]  Physical Exam  Constitutional: Oriented to person, place, and time and well-developed, well-nourished, and in no distress. No distress.  HENT:  Head: Normocephalic and atraumatic.  Mouth/Throat: Oropharynx is clear and moist. No oropharyngeal exudate.  Eyes: Conjunctivae are normal. Right eye exhibits no discharge. Left eye exhibits no discharge. No scleral icterus.  Neck: Normal range of motion. Neck supple.  Cardiovascular: Normal rate, regular rhythm, normal heart sounds and intact distal pulses.  Pulmonary/Chest: Effort normal and breath sounds normal. No respiratory distress. No wheezes. No rales.  Abdominal: Soft. Bowel sounds are normal. Exhibits no distension and no mass. There is no tenderness.  Musculoskeletal: Normal range of motion. Exhibits no edema.  Lymphadenopathy:    No cervical  adenopathy.  Neurological: Alert and oriented to person, place, and time. Exhibits normal muscle tone. Gait normal. Coordination normal.  Skin: Skin is warm and dry. No rash noted. Not diaphoretic. No erythema. No pallor.  Psychiatric: Mood, memory and judgment normal.  Vitals reviewed.  LABORATORY DATA: Lab Results  Component Value Date   WBC 3.2 (L) 05/01/2021   HGB 9.4 (L) 05/01/2021   HCT 29.7 (L) 05/01/2021   MCV 102.8 (H) 05/01/2021   PLT 118 (L) 05/01/2021      Chemistry      Component Value Date/Time   NA 139 05/01/2021 1053   K 4.8 05/01/2021 1053   CL 106 05/01/2021 1053   CO2 25 05/01/2021 1053   BUN 47 (H) 05/01/2021 1053   CREATININE 2.14 (H) 05/01/2021 1053      Component Value Date/Time   CALCIUM 8.7 (L) 05/01/2021 1053   ALKPHOS 74 05/01/2021 1053   AST 15 05/01/2021 1053   ALT 24 05/01/2021 1053   BILITOT 0.3 05/01/2021 1053       RADIOGRAPHIC STUDIES:  No results found.   ASSESSMENT/PLAN:  No problem-specific Assessment & Plan notes found for this encounter.   No orders of the defined types were placed in this encounter.    I spent {CHL ONC TIME VISIT - MQKMM:[id number]} counseling the patient face to face. The total time spent in the appointment was {CHL ONC TIME VISIT - XUXYB:[id number]}.  Chevella Pearce L Elice Crigger, PA-C 05/21/21

## 2021-05-22 ENCOUNTER — Ambulatory Visit: Payer: Medicare HMO | Admitting: Physician Assistant

## 2021-05-22 ENCOUNTER — Inpatient Hospital Stay (HOSPITAL_BASED_OUTPATIENT_CLINIC_OR_DEPARTMENT_OTHER): Payer: Medicare HMO | Admitting: Physician Assistant

## 2021-05-22 ENCOUNTER — Other Ambulatory Visit: Payer: Medicare HMO

## 2021-05-22 ENCOUNTER — Other Ambulatory Visit: Payer: Self-pay

## 2021-05-22 ENCOUNTER — Ambulatory Visit: Payer: Medicare HMO

## 2021-05-22 ENCOUNTER — Inpatient Hospital Stay: Payer: Medicare HMO

## 2021-05-22 VITALS — BP 119/74 | HR 67 | Temp 96.5°F | Resp 18 | Wt 137.1 lb

## 2021-05-22 DIAGNOSIS — C3411 Malignant neoplasm of upper lobe, right bronchus or lung: Secondary | ICD-10-CM | POA: Diagnosis not present

## 2021-05-22 DIAGNOSIS — Z5112 Encounter for antineoplastic immunotherapy: Secondary | ICD-10-CM

## 2021-05-22 DIAGNOSIS — Z79899 Other long term (current) drug therapy: Secondary | ICD-10-CM | POA: Diagnosis not present

## 2021-05-22 DIAGNOSIS — C7989 Secondary malignant neoplasm of other specified sites: Secondary | ICD-10-CM | POA: Diagnosis not present

## 2021-05-22 DIAGNOSIS — C778 Secondary and unspecified malignant neoplasm of lymph nodes of multiple regions: Secondary | ICD-10-CM | POA: Diagnosis not present

## 2021-05-22 LAB — CBC WITH DIFFERENTIAL (CANCER CENTER ONLY)
Abs Immature Granulocytes: 0.01 10*3/uL (ref 0.00–0.07)
Basophils Absolute: 0 10*3/uL (ref 0.0–0.1)
Basophils Relative: 0 %
Eosinophils Absolute: 0.1 10*3/uL (ref 0.0–0.5)
Eosinophils Relative: 3 %
HCT: 28.9 % — ABNORMAL LOW (ref 39.0–52.0)
Hemoglobin: 9.4 g/dL — ABNORMAL LOW (ref 13.0–17.0)
Immature Granulocytes: 0 %
Lymphocytes Relative: 19 %
Lymphs Abs: 0.5 10*3/uL — ABNORMAL LOW (ref 0.7–4.0)
MCH: 32.1 pg (ref 26.0–34.0)
MCHC: 32.5 g/dL (ref 30.0–36.0)
MCV: 98.6 fL (ref 80.0–100.0)
Monocytes Absolute: 0.4 10*3/uL (ref 0.1–1.0)
Monocytes Relative: 13 %
Neutro Abs: 1.8 10*3/uL (ref 1.7–7.7)
Neutrophils Relative %: 65 %
Platelet Count: 121 10*3/uL — ABNORMAL LOW (ref 150–400)
RBC: 2.93 MIL/uL — ABNORMAL LOW (ref 4.22–5.81)
RDW: 15 % (ref 11.5–15.5)
WBC Count: 2.7 10*3/uL — ABNORMAL LOW (ref 4.0–10.5)
nRBC: 0 % (ref 0.0–0.2)

## 2021-05-22 LAB — CMP (CANCER CENTER ONLY)
ALT: 32 U/L (ref 0–44)
AST: 19 U/L (ref 15–41)
Albumin: 2.5 g/dL — ABNORMAL LOW (ref 3.5–5.0)
Alkaline Phosphatase: 78 U/L (ref 38–126)
Anion gap: 6 (ref 5–15)
BUN: 46 mg/dL — ABNORMAL HIGH (ref 8–23)
CO2: 24 mmol/L (ref 22–32)
Calcium: 8.6 mg/dL — ABNORMAL LOW (ref 8.9–10.3)
Chloride: 105 mmol/L (ref 98–111)
Creatinine: 2.27 mg/dL — ABNORMAL HIGH (ref 0.61–1.24)
GFR, Estimated: 28 mL/min — ABNORMAL LOW (ref >60)
Glucose, Bld: 93 mg/dL (ref 70–99)
Potassium: 4.6 mmol/L (ref 3.5–5.1)
Sodium: 135 mmol/L (ref 135–145)
Total Bilirubin: 0.3 mg/dL (ref 0.3–1.2)
Total Protein: 7.3 g/dL (ref 6.5–8.1)

## 2021-05-22 LAB — TSH: TSH: 2.827 u[IU]/mL (ref 0.320–4.118)

## 2021-05-22 MED ORDER — SODIUM CHLORIDE 0.9 % IV SOLN
350.0000 mg | Freq: Once | INTRAVENOUS | Status: AC
  Administered 2021-05-22: 350 mg via INTRAVENOUS
  Filled 2021-05-22: qty 7

## 2021-05-22 MED ORDER — SODIUM CHLORIDE 0.9 % IV SOLN: Freq: Once | INTRAVENOUS | Status: AC

## 2021-05-22 NOTE — Patient Instructions (Signed)
[facility] ONCOLOGY   Discharge Instructions: Thank you for choosing [facility] to provide your oncology and hematology care.   If you have a lab appointment with the [facility], please go directly to the [facility] and check in at the registration area.   Wear comfortable clothing and clothing appropriate for easy access to any Portacath or PICC line.   We strive to give you quality time with your provider. You may need to reschedule your appointment if you arrive late (15 or more minutes).  Arriving late affects you and other patients whose appointments are after yours.  Also, if you miss three or more appointments without notifying the office, you may be dismissed from the clinic at the provider's discretion.      For prescription refill requests, have your pharmacy contact our office and allow 72 hours for refills to be completed.    Today you received the following chemotherapy and/or immunotherapy agents: Cemiplimab (Libtayo)      To help prevent nausea and vomiting after your treatment, we encourage you to take your nausea medication as directed.  BELOW ARE SYMPTOMS THAT SHOULD BE REPORTED IMMEDIATELY: *FEVER GREATER THAN 100.4 F (38 C) OR HIGHER *CHILLS OR SWEATING *NAUSEA AND VOMITING THAT IS NOT CONTROLLED WITH YOUR NAUSEA MEDICATION *UNUSUAL SHORTNESS OF BREATH *UNUSUAL BRUISING OR BLEEDING *URINARY PROBLEMS (pain or burning when urinating, or frequent urination) *BOWEL PROBLEMS (unusual diarrhea, constipation, pain near the anus) TENDERNESS IN MOUTH AND THROAT WITH OR WITHOUT PRESENCE OF ULCERS (sore throat, sores in mouth, or a toothache) UNUSUAL RASH, SWELLING OR PAIN  UNUSUAL VAGINAL DISCHARGE OR ITCHING   Items with * indicate a potential emergency and should be followed up as soon as possible or go to the Emergency Department if any problems should occur.  Please show the CHEMOTHERAPY ALERT CARD or IMMUNOTHERAPY ALERT CARD at  check-in to the Emergency Department and triage nurse.  Should you have questions after your visit or need to cancel or reschedule your appointment, please contact [facility]  Dept: [phone]  and follow the prompts.  Office hours are 8:00 a.m. to 4:30 p.m. Monday - Friday. Please note that voicemails left after 4:00 p.m. may not be returned until the following business day.  We are closed weekends and major holidays. You have access to a nurse at all times for urgent questions. Please call the main number to the clinic Dept: [phone] and follow the prompts.   For any non-urgent questions, you may also contact your provider using MyChart. We now offer e-Visits for anyone 41 and older to request care online for non-urgent symptoms. For details visit [email or web address].   Also download the MyChart app! Go to the app store, search "MyChart", open the app, select [facility], and log in with your MyChart username and password.  Due to Covid, a mask is required upon entering the hospital/clinic. If you do not have a mask, one will be given to you upon arrival. For doctor visits, patients may have 1 support person aged 53 or older with them. For treatment visits, patients cannot have anyone with them due to current Covid guidelines and our immunocompromised population.

## 2021-06-06 DIAGNOSIS — N529 Male erectile dysfunction, unspecified: Secondary | ICD-10-CM | POA: Diagnosis not present

## 2021-06-06 DIAGNOSIS — R636 Underweight: Secondary | ICD-10-CM | POA: Diagnosis not present

## 2021-06-06 DIAGNOSIS — N400 Enlarged prostate without lower urinary tract symptoms: Secondary | ICD-10-CM | POA: Diagnosis not present

## 2021-06-06 DIAGNOSIS — Z87891 Personal history of nicotine dependence: Secondary | ICD-10-CM | POA: Diagnosis not present

## 2021-06-06 DIAGNOSIS — Z809 Family history of malignant neoplasm, unspecified: Secondary | ICD-10-CM | POA: Diagnosis not present

## 2021-06-06 DIAGNOSIS — E039 Hypothyroidism, unspecified: Secondary | ICD-10-CM | POA: Diagnosis not present

## 2021-06-06 DIAGNOSIS — C3490 Malignant neoplasm of unspecified part of unspecified bronchus or lung: Secondary | ICD-10-CM | POA: Diagnosis not present

## 2021-06-06 DIAGNOSIS — I10 Essential (primary) hypertension: Secondary | ICD-10-CM | POA: Diagnosis not present

## 2021-06-12 ENCOUNTER — Inpatient Hospital Stay: Payer: Medicare HMO

## 2021-06-12 ENCOUNTER — Encounter: Payer: Self-pay | Admitting: Internal Medicine

## 2021-06-12 ENCOUNTER — Other Ambulatory Visit: Payer: Self-pay

## 2021-06-12 ENCOUNTER — Telehealth: Payer: Self-pay | Admitting: Internal Medicine

## 2021-06-12 ENCOUNTER — Inpatient Hospital Stay: Payer: Medicare HMO | Attending: Internal Medicine | Admitting: Internal Medicine

## 2021-06-12 VITALS — BP 127/68 | HR 72 | Temp 97.4°F | Resp 18 | Ht 74.0 in | Wt 139.8 lb

## 2021-06-12 DIAGNOSIS — C3411 Malignant neoplasm of upper lobe, right bronchus or lung: Secondary | ICD-10-CM

## 2021-06-12 DIAGNOSIS — Z79899 Other long term (current) drug therapy: Secondary | ICD-10-CM | POA: Insufficient documentation

## 2021-06-12 DIAGNOSIS — C7989 Secondary malignant neoplasm of other specified sites: Secondary | ICD-10-CM | POA: Diagnosis not present

## 2021-06-12 DIAGNOSIS — C778 Secondary and unspecified malignant neoplasm of lymph nodes of multiple regions: Secondary | ICD-10-CM | POA: Diagnosis not present

## 2021-06-12 DIAGNOSIS — Z5112 Encounter for antineoplastic immunotherapy: Secondary | ICD-10-CM | POA: Insufficient documentation

## 2021-06-12 LAB — CMP (CANCER CENTER ONLY)
ALT: 24 U/L (ref 0–44)
AST: 11 U/L — ABNORMAL LOW (ref 15–41)
Albumin: 2.7 g/dL — ABNORMAL LOW (ref 3.5–5.0)
Alkaline Phosphatase: 87 U/L (ref 38–126)
Anion gap: 7 (ref 5–15)
BUN: 42 mg/dL — ABNORMAL HIGH (ref 8–23)
CO2: 25 mmol/L (ref 22–32)
Calcium: 8.9 mg/dL (ref 8.9–10.3)
Chloride: 105 mmol/L (ref 98–111)
Creatinine: 2.1 mg/dL — ABNORMAL HIGH (ref 0.61–1.24)
GFR, Estimated: 30 mL/min — ABNORMAL LOW (ref >60)
Glucose, Bld: 85 mg/dL (ref 70–99)
Potassium: 4.6 mmol/L (ref 3.5–5.1)
Sodium: 137 mmol/L (ref 135–145)
Total Bilirubin: 0.4 mg/dL (ref 0.3–1.2)
Total Protein: 7.6 g/dL (ref 6.5–8.1)

## 2021-06-12 LAB — CBC WITH DIFFERENTIAL (CANCER CENTER ONLY)
Abs Immature Granulocytes: 0.01 10*3/uL (ref 0.00–0.07)
Basophils Absolute: 0 10*3/uL (ref 0.0–0.1)
Basophils Relative: 0 %
Eosinophils Absolute: 0 10*3/uL (ref 0.0–0.5)
Eosinophils Relative: 1 %
HCT: 31.7 % — ABNORMAL LOW (ref 39.0–52.0)
Hemoglobin: 10.3 g/dL — ABNORMAL LOW (ref 13.0–17.0)
Immature Granulocytes: 0 %
Lymphocytes Relative: 11 %
Lymphs Abs: 0.5 10*3/uL — ABNORMAL LOW (ref 0.7–4.0)
MCH: 31.9 pg (ref 26.0–34.0)
MCHC: 32.5 g/dL (ref 30.0–36.0)
MCV: 98.1 fL (ref 80.0–100.0)
Monocytes Absolute: 0.4 10*3/uL (ref 0.1–1.0)
Monocytes Relative: 9 %
Neutro Abs: 3.4 10*3/uL (ref 1.7–7.7)
Neutrophils Relative %: 79 %
Platelet Count: 134 10*3/uL — ABNORMAL LOW (ref 150–400)
RBC: 3.23 MIL/uL — ABNORMAL LOW (ref 4.22–5.81)
RDW: 14.8 % (ref 11.5–15.5)
WBC Count: 4.3 10*3/uL (ref 4.0–10.5)
nRBC: 0 % (ref 0.0–0.2)

## 2021-06-12 LAB — TSH: TSH: 4.229 u[IU]/mL — ABNORMAL HIGH (ref 0.320–4.118)

## 2021-06-12 MED ORDER — SODIUM CHLORIDE 0.9 % IV SOLN
350.0000 mg | Freq: Once | INTRAVENOUS | Status: AC
  Administered 2021-06-12: 350 mg via INTRAVENOUS
  Filled 2021-06-12: qty 7

## 2021-06-12 MED ORDER — SODIUM CHLORIDE 0.9 % IV SOLN: Freq: Once | INTRAVENOUS | Status: AC

## 2021-06-12 NOTE — Telephone Encounter (Signed)
No 9/15 LOS

## 2021-06-12 NOTE — Patient Instructions (Signed)
[facility] ONCOLOGY   Discharge Instructions: Thank you for choosing [facility] to provide your oncology and hematology care.   If you have a lab appointment with the [facility], please go directly to the [facility] and check in at the registration area.   Wear comfortable clothing and clothing appropriate for easy access to any Portacath or PICC line.   We strive to give you quality time with your provider. You may need to reschedule your appointment if you arrive late (15 or more minutes).  Arriving late affects you and other patients whose appointments are after yours.  Also, if you miss three or more appointments without notifying the office, you may be dismissed from the clinic at the provider's discretion.      For prescription refill requests, have your pharmacy contact our office and allow 72 hours for refills to be completed.    Today you received the following chemotherapy and/or immunotherapy agents: cemiplimab-rwlc.      To help prevent nausea and vomiting after your treatment, we encourage you to take your nausea medication as directed.  BELOW ARE SYMPTOMS THAT SHOULD BE REPORTED IMMEDIATELY: *FEVER GREATER THAN 100.4 F (38 C) OR HIGHER *CHILLS OR SWEATING *NAUSEA AND VOMITING THAT IS NOT CONTROLLED WITH YOUR NAUSEA MEDICATION *UNUSUAL SHORTNESS OF BREATH *UNUSUAL BRUISING OR BLEEDING *URINARY PROBLEMS (pain or burning when urinating, or frequent urination) *BOWEL PROBLEMS (unusual diarrhea, constipation, pain near the anus) TENDERNESS IN MOUTH AND THROAT WITH OR WITHOUT PRESENCE OF ULCERS (sore throat, sores in mouth, or a toothache) UNUSUAL RASH, SWELLING OR PAIN  UNUSUAL VAGINAL DISCHARGE OR ITCHING   Items with * indicate a potential emergency and should be followed up as soon as possible or go to the Emergency Department if any problems should occur.  Please show the CHEMOTHERAPY ALERT CARD or IMMUNOTHERAPY ALERT CARD at  check-in to the Emergency Department and triage nurse.  Should you have questions after your visit or need to cancel or reschedule your appointment, please contact [facility]  Dept: [phone]  and follow the prompts.  Office hours are 8:00 a.m. to 4:30 p.m. Monday - Friday. Please note that voicemails left after 4:00 p.m. may not be returned until the following business day.  We are closed weekends and major holidays. You have access to a nurse at all times for urgent questions. Please call the main number to the clinic Dept: [phone] and follow the prompts.   For any non-urgent questions, you may also contact your provider using MyChart. We now offer e-Visits for anyone 60 and older to request care online for non-urgent symptoms. For details visit [email or web address].   Also download the MyChart app! Go to the app store, search "MyChart", open the app, select [facility], and log in with your MyChart username and password.  Due to Covid, a mask is required upon entering the hospital/clinic. If you do not have a mask, one will be given to you upon arrival. For doctor visits, patients may have 1 support person aged 32 or older with them. For treatment visits, patients cannot have anyone with them due to current Covid guidelines and our immunocompromised population.

## 2021-06-12 NOTE — Progress Notes (Signed)
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  Biomarker Findings Tumor Mutational Burden - 10 Muts/Mb Microsatellite status - MS-Stable Genomic Findings For a complete list of the genes assayed, please refer to the Appendix. KRAS G12C NFKBIA amplification NKX2-1 amplification RBM10 D676f*80 7 Disease relevant genes with no reportable alterations: ALK, BRAF, EGFR, ERBB2, MET, RET, ROS1   PDL1: 90%   PRIOR THERAPY:  Weekly concurrent chemoradiation with carboplatin for an AUC of 2 and paclitaxel 45 mg/m.  First dose on 12/30/2020.  Status post 6 cycles.   CURRENT THERAPY: First-line treatment with immunotherapy with Libtayo (Cempilimab) 350 Mg IV every 3 weeks.  Status post 4 cycles.  INTERVAL HISTORY: Brian BRAWLEY87y.o. male returns to the clinic today for follow-up visit.  The patient is feeling fine today with no concerning complaints except for intermittent pain in the left hip area and he takes Tylenol with improvement of his symptoms.  He denied having any current chest pain, shortness of breath, cough or hemoptysis.  He denied having any fever or chills.  He has no nausea, vomiting, diarrhea or constipation.  He has no headache or visual changes.  He has no recent weight loss or night sweats.  He continues to tolerate his treatment with Libtayo (Cempilimab) fairly well.  The patient is here today for evaluation before starting cycle #5 of his treatment.  MEDICAL HISTORY: Past Medical History:  Diagnosis Date   Arthritis    Hypertension    Hyperthyroidism     lung ca 11/2020    ALLERGIES:  has No Known Allergies.  MEDICATIONS:  Current Outpatient Medications  Medication Sig Dispense Refill   gabapentin (NEURONTIN) 250 MG/5ML solution Take 3 mLs (150 mg total) by mouth at bedtime. 90 mL 2   labetalol (NORMODYNE) 200 MG tablet Take 200 mg by mouth 2 (two) times daily.     levothyroxine (SYNTHROID) 150 MCG tablet Take 150 mcg by mouth daily before breakfast.     naproxen (NAPROSYN) 500 MG tablet Take 500 mg by mouth 2 (two) times daily as needed for moderate pain.     Olmesartan-Amlodipine-HCTZ 40-10-12.5 MG TABS Take 1 tablet by mouth daily.     oxyCODONE-acetaminophen (PERCOCET/ROXICET) 5-325 MG tablet Take 1 tablet by mouth every 8 (eight) hours as needed for severe pain. 30 tablet 0   prochlorperazine (COMPAZINE) 10 MG tablet Take 1 tablet (10 mg total) by mouth every 6 (six) hours as needed for nausea or vomiting. 30 tablet 0   tadalafil (CIALIS) 5 MG tablet Take 5 mg by mouth daily.     tamsulosin (FLOMAX) 0.4 MG CAPS capsule Take 0.4 mg by mouth daily.     No current facility-administered medications for this visit.    SURGICAL HISTORY:  Past Surgical History:  Procedure Laterality Date   COLONOSCOPY N/A 09/02/2016   Procedure: COLONOSCOPY;  Surgeon: NRogene Houston MD;  [facility];  Service: Endoscopy;  Laterality: N/A;  830   NO PAST SURGERIES     POLYPECTOMY  09/02/2016   Procedure: POLYPECTOMY;  Surgeon: NRogene Houston MD;  [facility];  Service: Endoscopy;;    REVIEW OF SYSTEMS:  A comprehensive review of systems was negative except for: Musculoskeletal: positive for arthralgias   PHYSICAL EXAMINATION: General appearance: alert, cooperative, and no distress Head: Normocephalic, without obvious abnormality, atraumatic Neck: no adenopathy, no JVD, supple, symmetrical, trachea midline, and thyroid not enlarged, symmetric, no tenderness/mass/nodules Lymph nodes: Cervical, supraclavicular, and axillary  nodes normal. Resp: clear to auscultation bilaterally Back: symmetric, no curvature. ROM normal. No CVA tenderness. Cardio: regular rate and rhythm, S1, S2 normal, no murmur, click, rub or gallop GI: soft, non-tender; bowel sounds normal; no masses,  no organomegaly Extremities: extremities normal, atraumatic, no cyanosis or edema  ECOG PERFORMANCE STATUS: 1 - Symptomatic but completely ambulatory  Blood pressure 127/68, pulse 72, temperature (!) 97.4 F (36.3 C), temperature source Tympanic, resp. rate 18, height 6' 2"  (1.88 m), weight 139 lb 12.8 oz (63.4 kg), SpO2 99 %.  LABORATORY DATA: Lab Results  Component Value Date   WBC 2.7 (L) 05/22/2021   HGB 9.4 (L) 05/22/2021   HCT 28.9 (L) 05/22/2021   MCV 98.6 05/22/2021   PLT 121 (L) 05/22/2021      Chemistry      Component Value Date/Time   NA 135 05/22/2021 1407   K 4.6 05/22/2021 1407   CL 105 05/22/2021 1407   CO2 24 05/22/2021 1407   BUN 46 (H) 05/22/2021 1407   CREATININE 2.27 (H) 05/22/2021 1407      Component Value Date/Time   CALCIUM 8.6 (L) 05/22/2021 1407   ALKPHOS 78 05/22/2021 1407   AST 19 05/22/2021 1407   ALT 32 05/22/2021 1407   BILITOT 0.3 05/22/2021 1407       RADIOGRAPHIC STUDIES: CT Chest Wo Contrast  Result Date: 05/21/2021 CLINICAL DATA:  Non-small-cell lung cancer.  Restaging. EXAM: CT CHEST WITHOUT CONTRAST TECHNIQUE: Multidetector CT imaging of the chest was performed following the standard protocol without IV contrast. COMPARISON:  03/12/2021 FINDINGS: Cardiovascular: The heart size is normal. No substantial pericardial effusion. Coronary artery calcification is evident. Moderate atherosclerotic calcification is noted in the wall of the thoracic aorta. Mediastinum/Nodes: Stable multinodular thyroid enlargement upper normal mediastinal lymph nodes are similar to prior. Hilar regions not well evaluated on this noncontrast study. The esophagus has normal imaging features. There is no axillary  lymphadenopathy. Lungs/Pleura: Peripheral right upper lobe pulmonary mass has decreased substantially in the interval measuring 6.9 x 4.2 cm on image 54/2 today compared to 8.4 x 6.7 cm previously. Interval increase in consolidative airspace disease in the right lung and right parahilar region is presumably treatment related although infection and recurrent disease could also have this appearance. Tiny bilateral pleural effusions noted. Upper Abdomen: Small hypoattenuating splenic lesion is stable. Probable cysts in both kidneys are similar to prior. Musculoskeletal: No worrisome lytic or sclerotic osseous abnormality. Destruction of the right third rib with involvement of the right fourth rib similar to prior. IMPRESSION: 1. Marked interval decrease in size of the peripheral right upper lobe pulmonary mass. 2. Interval increase in consolidative airspace disease in the right lung and right parahilar region, presumably treatment related although infection and recurrence could also have this appearance. 3. Similar appearance of bony destruction of the right third and fourth ribs. 4. Aortic Atherosclerosis (ICD10-I70.0). Electronically Signed   By: Misty Stanley M.D.   On: 05/21/2021 11:55    ASSESSMENT AND PLAN: This is a very pleasant 87 years old African-American male recently diagnosed with a stage IVA (T4, N3, M1b) non-small  cell lung cancer, adenosquamous carcinoma in March 2022 and presented with large mass involving the lateral right upper lobe and right chest wall soft tissue in addition to right hilar, mediastinal and right subpectoral and supraclavicular lymphadenopathy with PD-L1 expression of 90%.  The patient underwent a course of concurrent chemoradiation with weekly carboplatin for AUC of 2 and paclitaxel 45 Mg/M2 status post 6 cycles. He tolerated the previous course of his concurrent chemoradiation fairly well except for fatigue. Unfortunately his CT scan of the chest after the induction phase  showed interval enlargement of a large mass centered at the periphery of the right upper lobe and extending into the adjacent chest wall measuring 8.4 x 6.7 cm with bony destruction of the right third and fourth ribs substantially increased compared to the prior examination and the findings are consistent with worsened malignancy. The patient has PD-L1 expression of 90% and I felt he will be a good candidate for treatment with first-line single agent immunotherapy. I recommended for him treatment with Libtayo (Cempilimab) 350 Mg IV every 3 weeks.  He is status post 4 cycles of treatment. The patient continues to tolerate this treatment well with no concerning adverse effects. I recommended for him to proceed with cycle #5 today as planned. I will see him back for follow-up visit in 3 weeks for evaluation before the next cycle of his treatment. The patient was advised to call immediately if he has any concerning symptoms in the interval. The patient voices understanding of current disease status and treatment options and is in agreement with the current care plan.  All questions were answered. The patient knows to call the clinic with any problems, questions or concerns. We can certainly see the patient much sooner if necessary.   Disclaimer: This note was dictated with voice recognition software. Similar sounding words can inadvertently be transcribed and may not be corrected upon review.

## 2021-06-12 NOTE — Progress Notes (Signed)
Per Dr. Julien Nordmann it is ok to treat pt today with Libtayo and creatinine 2.1.

## 2021-06-16 DIAGNOSIS — I10 Essential (primary) hypertension: Secondary | ICD-10-CM | POA: Diagnosis not present

## 2021-06-16 DIAGNOSIS — Z681 Body mass index (BMI) 19 or less, adult: Secondary | ICD-10-CM | POA: Diagnosis not present

## 2021-06-16 DIAGNOSIS — R636 Underweight: Secondary | ICD-10-CM | POA: Diagnosis not present

## 2021-06-16 DIAGNOSIS — Z8546 Personal history of malignant neoplasm of prostate: Secondary | ICD-10-CM | POA: Diagnosis not present

## 2021-06-16 DIAGNOSIS — N401 Enlarged prostate with lower urinary tract symptoms: Secondary | ICD-10-CM | POA: Diagnosis not present

## 2021-06-16 DIAGNOSIS — M170 Bilateral primary osteoarthritis of knee: Secondary | ICD-10-CM | POA: Diagnosis not present

## 2021-06-16 DIAGNOSIS — C3492 Malignant neoplasm of unspecified part of left bronchus or lung: Secondary | ICD-10-CM | POA: Diagnosis not present

## 2021-06-16 DIAGNOSIS — G894 Chronic pain syndrome: Secondary | ICD-10-CM | POA: Diagnosis not present

## 2021-06-16 DIAGNOSIS — E039 Hypothyroidism, unspecified: Secondary | ICD-10-CM | POA: Diagnosis not present

## 2021-06-30 DIAGNOSIS — R636 Underweight: Secondary | ICD-10-CM | POA: Diagnosis not present

## 2021-06-30 DIAGNOSIS — M170 Bilateral primary osteoarthritis of knee: Secondary | ICD-10-CM | POA: Diagnosis not present

## 2021-06-30 DIAGNOSIS — I10 Essential (primary) hypertension: Secondary | ICD-10-CM | POA: Diagnosis not present

## 2021-06-30 DIAGNOSIS — N401 Enlarged prostate with lower urinary tract symptoms: Secondary | ICD-10-CM | POA: Diagnosis not present

## 2021-06-30 DIAGNOSIS — Z681 Body mass index (BMI) 19 or less, adult: Secondary | ICD-10-CM | POA: Diagnosis not present

## 2021-06-30 DIAGNOSIS — Z8546 Personal history of malignant neoplasm of prostate: Secondary | ICD-10-CM | POA: Diagnosis not present

## 2021-06-30 DIAGNOSIS — C3492 Malignant neoplasm of unspecified part of left bronchus or lung: Secondary | ICD-10-CM | POA: Diagnosis not present

## 2021-06-30 DIAGNOSIS — G894 Chronic pain syndrome: Secondary | ICD-10-CM | POA: Diagnosis not present

## 2021-06-30 DIAGNOSIS — E039 Hypothyroidism, unspecified: Secondary | ICD-10-CM | POA: Diagnosis not present

## 2021-07-03 ENCOUNTER — Inpatient Hospital Stay: Payer: Medicare HMO

## 2021-07-03 ENCOUNTER — Ambulatory Visit: Payer: Medicare HMO | Admitting: Nutrition

## 2021-07-03 ENCOUNTER — Encounter: Payer: Self-pay | Admitting: Internal Medicine

## 2021-07-03 ENCOUNTER — Inpatient Hospital Stay: Payer: Medicare HMO | Attending: Internal Medicine | Admitting: Internal Medicine

## 2021-07-03 ENCOUNTER — Other Ambulatory Visit: Payer: Self-pay

## 2021-07-03 VITALS — BP 111/59 | HR 70 | Temp 98.0°F | Resp 20 | Ht 74.0 in | Wt 147.6 lb

## 2021-07-03 DIAGNOSIS — C770 Secondary and unspecified malignant neoplasm of lymph nodes of head, face and neck: Secondary | ICD-10-CM | POA: Diagnosis not present

## 2021-07-03 DIAGNOSIS — Z5112 Encounter for antineoplastic immunotherapy: Secondary | ICD-10-CM | POA: Insufficient documentation

## 2021-07-03 DIAGNOSIS — Z79899 Other long term (current) drug therapy: Secondary | ICD-10-CM | POA: Diagnosis not present

## 2021-07-03 DIAGNOSIS — C3411 Malignant neoplasm of upper lobe, right bronchus or lung: Secondary | ICD-10-CM | POA: Diagnosis not present

## 2021-07-03 DIAGNOSIS — C7989 Secondary malignant neoplasm of other specified sites: Secondary | ICD-10-CM | POA: Insufficient documentation

## 2021-07-03 LAB — CBC WITH DIFFERENTIAL (CANCER CENTER ONLY)
Abs Immature Granulocytes: 0.01 10*3/uL (ref 0.00–0.07)
Basophils Absolute: 0 10*3/uL (ref 0.0–0.1)
Basophils Relative: 0 %
Eosinophils Absolute: 0.1 10*3/uL (ref 0.0–0.5)
Eosinophils Relative: 3 %
HCT: 29.1 % — ABNORMAL LOW (ref 39.0–52.0)
Hemoglobin: 9.5 g/dL — ABNORMAL LOW (ref 13.0–17.0)
Immature Granulocytes: 0 %
Lymphocytes Relative: 13 %
Lymphs Abs: 0.4 10*3/uL — ABNORMAL LOW (ref 0.7–4.0)
MCH: 32.2 pg (ref 26.0–34.0)
MCHC: 32.6 g/dL (ref 30.0–36.0)
MCV: 98.6 fL (ref 80.0–100.0)
Monocytes Absolute: 0.4 10*3/uL (ref 0.1–1.0)
Monocytes Relative: 11 %
Neutro Abs: 2.3 10*3/uL (ref 1.7–7.7)
Neutrophils Relative %: 73 %
Platelet Count: 135 10*3/uL — ABNORMAL LOW (ref 150–400)
RBC: 2.95 MIL/uL — ABNORMAL LOW (ref 4.22–5.81)
RDW: 15.2 % (ref 11.5–15.5)
WBC Count: 3.2 10*3/uL — ABNORMAL LOW (ref 4.0–10.5)
nRBC: 0 % (ref 0.0–0.2)

## 2021-07-03 LAB — CMP (CANCER CENTER ONLY)
ALT: 21 U/L (ref 0–44)
AST: 13 U/L — ABNORMAL LOW (ref 15–41)
Albumin: 2.6 g/dL — ABNORMAL LOW (ref 3.5–5.0)
Alkaline Phosphatase: 88 U/L (ref 38–126)
Anion gap: 8 (ref 5–15)
BUN: 43 mg/dL — ABNORMAL HIGH (ref 8–23)
CO2: 24 mmol/L (ref 22–32)
Calcium: 8.5 mg/dL — ABNORMAL LOW (ref 8.9–10.3)
Chloride: 109 mmol/L (ref 98–111)
Creatinine: 2.36 mg/dL — ABNORMAL HIGH (ref 0.61–1.24)
GFR, Estimated: 26 mL/min — ABNORMAL LOW (ref >60)
Glucose, Bld: 108 mg/dL — ABNORMAL HIGH (ref 70–99)
Potassium: 4.2 mmol/L (ref 3.5–5.1)
Sodium: 141 mmol/L (ref 135–145)
Total Bilirubin: 0.3 mg/dL (ref 0.3–1.2)
Total Protein: 7.1 g/dL (ref 6.5–8.1)

## 2021-07-03 LAB — TSH: TSH: 6.545 u[IU]/mL — ABNORMAL HIGH (ref 0.320–4.118)

## 2021-07-03 MED ORDER — SODIUM CHLORIDE 0.9 % IV SOLN
Freq: Once | INTRAVENOUS | Status: AC
Start: 2021-07-03 — End: 2021-07-03

## 2021-07-03 MED ORDER — SODIUM CHLORIDE 0.9 % IV SOLN
350.0000 mg | Freq: Once | INTRAVENOUS | Status: AC
  Administered 2021-07-03: 350 mg via INTRAVENOUS
  Filled 2021-07-03: qty 7

## 2021-07-03 NOTE — Progress Notes (Signed)
Brief follow up with patient during infusion. He reported diarrhea every time he urinates. When asked in infusion, he denied diarrhea with ensure. He does not have any more Immodium and asked me for a "pill" to help loose stools. Weight improved to 147.6 pounds on Oct 6 from 139.8 pounds Sept 15. RN contacted to clarify Immodium instructions. She verbalized she will write out directions for patient. Provided patient with samples of clear oral nutrition supplements as an option. Patient verbalized understanding and appreciation.

## 2021-07-03 NOTE — Progress Notes (Signed)
Per Dr Julien Nordmann , it is ok to treat pt today with Libtayo and serum creatinine of 2.36.

## 2021-07-03 NOTE — Progress Notes (Signed)
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  Biomarker Findings Tumor Mutational Burden - 10 Muts/Mb Microsatellite status - MS-Stable Genomic Findings For a complete list of the genes assayed, please refer to the Appendix. KRAS G12C NFKBIA amplification NKX2-1 amplification RBM10 D690f*80 7 Disease relevant genes with no reportable alterations: ALK, BRAF, EGFR, ERBB2, MET, RET, ROS1   PDL1: 90%   PRIOR THERAPY:  Weekly concurrent chemoradiation with carboplatin for an AUC of 2 and paclitaxel 45 mg/m.  First dose on 12/30/2020.  Status post 6 cycles.   CURRENT THERAPY: First-line treatment with immunotherapy with Libtayo (Cempilimab) 350 Mg IV every 3 weeks.  Status post 5 cycles.  INTERVAL HISTORY: Brian [age]ALLIN89y.o. male returns to the clinic today for follow-up visit accompanied by his nephew.  The patient is feeling fine today with no concerning complaints except for diarrhea every time he urinated.  He was on Imodium with improvement of the diarrhea but he has not been taking it recently.  He denied having any current chest pain, shortness of breath, cough or hemoptysis.  He denied having any fever or chills.  He has no current nausea, vomiting, abdominal pain or constipation.  He has no headache or visual changes.  He continues to tolerate his treatment with Libtayo (Cempilimab) fairly well.  He is here for evaluation before starting cycle #6.  MEDICAL HISTORY: Past Medical History:  Diagnosis Date   Arthritis    Hypertension    Hyperthyroidism    lung ca  11/2020    ALLERGIES:  has No Known Allergies.  MEDICATIONS:  Current Outpatient Medications  Medication Sig Dispense Refill   gabapentin (NEURONTIN) 250 MG/5ML solution Take 3 mLs (150 mg total) by mouth at bedtime. 90 mL 2   labetalol (NORMODYNE) 200 MG tablet Take 200 mg by mouth 2 (two) times daily.     levothyroxine (SYNTHROID) 150 MCG tablet Take 150 mcg by mouth daily before breakfast.     naproxen (NAPROSYN) 500 MG tablet Take 500 mg by mouth 2 (two) times daily as needed for moderate pain.     Olmesartan-Amlodipine-HCTZ 40-10-12.5 MG TABS Take 1 tablet by mouth daily.     oxyCODONE-acetaminophen (PERCOCET/ROXICET) 5-325 MG tablet Take 1 tablet by mouth every 8 (eight) hours as needed for severe pain. 30 tablet 0   prochlorperazine (COMPAZINE) 10 MG tablet Take 1 tablet (10 mg total) by mouth every 6 (six) hours as needed for nausea or vomiting. 30 tablet 0   tadalafil (CIALIS) 5 MG tablet Take 5 mg by mouth daily.     tamsulosin (FLOMAX) 0.4 MG CAPS capsule Take 0.4 mg by mouth daily.     No current facility-administered medications for this visit.    SURGICAL HISTORY:  Past Surgical History:  Procedure Laterality Date   COLONOSCOPY N/A 09/02/2016   Procedure: COLONOSCOPY;  Surgeon: NRogene Houston MD;  [facility];  Service: Endoscopy;  Laterality: N/A;  830   NO PAST SURGERIES     POLYPECTOMY  09/02/2016   Procedure: POLYPECTOMY;  Surgeon: NRogene Houston MD;  Location[facility];  Service: Endoscopy;;    REVIEW OF SYSTEMS:  A comprehensive review of systems was negative except for: Gastrointestinal: positive for diarrhea Musculoskeletal: positive for arthralgias   PHYSICAL EXAMINATION: General appearance: alert, cooperative, and no distress Head: Normocephalic, without obvious abnormality, atraumatic Neck: no adenopathy, no JVD, supple, symmetrical, trachea midline, and thyroid not enlarged, symmetric, no tenderness/mass/nodules Lymph nodes: Cervical,  supraclavicular, and axillary nodes normal. Resp: clear to auscultation bilaterally Back: symmetric, no curvature. ROM normal. No CVA tenderness. Cardio: regular rate and rhythm, S1, S2 normal, no murmur, click, rub or gallop GI: soft, non-tender; bowel sounds normal; no masses,  no organomegaly Extremities: extremities normal, atraumatic, no cyanosis or edema  ECOG PERFORMANCE STATUS: 1 - Symptomatic but completely ambulatory  Blood pressure (!) 111/59, pulse 70, temperature 98 F (36.7 C), temperature source Oral, resp. rate 20, height '6\' 2"'  (1.88 m), weight 147 lb 9.6 oz (67 kg), SpO2 91 %.  LABORATORY DATA: Lab Results  Component Value Date   WBC 3.2 (L) 07/03/2021   HGB 9.5 (L) 07/03/2021   HCT 29.1 (L) 07/03/2021   MCV 98.6 07/03/2021   PLT 135 (L) 07/03/2021      Chemistry      Component Value Date/Time   NA 137 06/12/2021 0939   K 4.6 06/12/2021 0939   CL 105 06/12/2021 0939   CO2 25 06/12/2021 0939   BUN 42 (H) 06/12/2021 0939   CREATININE 2.10 (H) 06/12/2021 0939      Component Value Date/Time   CALCIUM 8.9 06/12/2021 0939   ALKPHOS 87 06/12/2021 0939   AST 11 (L) 06/12/2021 0939   ALT 24 06/12/2021 0939   BILITOT 0.4 06/12/2021 0939       RADIOGRAPHIC STUDIES: No results found.  ASSESSMENT AND PLAN: This is a very pleasant 87 years old African-American male recently diagnosed with a stage IVA (T4, N3, M1b) non-small cell lung cancer, adenosquamous carcinoma in March 2022 and presented with large mass involving the lateral right upper lobe and right chest wall soft tissue in addition to right hilar, mediastinal and right subpectoral and supraclavicular lymphadenopathy with PD-L1 expression of 90%.  The patient underwent a course of concurrent chemoradiation with weekly carboplatin for AUC of 2 and paclitaxel 45 Mg/M2 status post 6 cycles. He tolerated the previous course of his concurrent chemoradiation fairly well except for fatigue. Unfortunately his CT scan  of the chest after the induction phase showed interval enlargement of a large mass centered at the periphery of the right upper lobe and extending into the adjacent chest wall measuring 8.4 x 6.7 cm with bony destruction of the right third and fourth ribs substantially increased compared to the prior examination and the findings are consistent with worsened malignancy. The patient has PD-L1 expression of 90% and I felt he will be a good candidate for treatment with first-line single agent immunotherapy. I recommended for him treatment with Libtayo (Cempilimab) 350 Mg IV every 3 weeks.  He is status post 5 cycles of treatment. The patient continues to tolerate this treatment well with no concerning adverse effects. I recommended for him to proceed with cycle #6 today as planned. I will see the patient back for follow-up visit in 3 weeks for evaluation with repeat CT scan of the chest for restaging of his disease. The patient was advised to call immediately if he has any other concerning symptoms in the interval. The patient voices understanding of current disease status and treatment options and is in agreement with the  current care plan.  All questions were answered. The patient knows to call the clinic with any problems, questions or concerns. We can certainly see the patient much sooner if necessary.   Disclaimer: This note was dictated with voice recognition software. Similar sounding words can inadvertently be transcribed and may not be corrected upon review.

## 2021-07-03 NOTE — Patient Instructions (Signed)
[facility]   Discharge Instructions: Thank you for choosing [facility] to provide your oncology and hematology care.   If you have a lab appointment with the [facility], please go directly to the [facility] and check in at the registration area.   Wear comfortable clothing and clothing appropriate for easy access to any Portacath or PICC line.   We strive to give you quality time with your provider. You may need to reschedule your appointment if you arrive late (15 or more minutes).  Arriving late affects you and other patients whose appointments are after yours.  Also, if you miss three or more appointments without notifying the office, you may be dismissed from the clinic at the provider's discretion.      For prescription refill requests, have your pharmacy contact our office and allow 72 hours for refills to be completed.    Today you received the following chemotherapy and/or immunotherapy agents: Cemiplimab (Libtayo).      To help prevent nausea and vomiting after your treatment, we encourage you to take your nausea medication as directed.  BELOW ARE SYMPTOMS THAT SHOULD BE REPORTED IMMEDIATELY: *FEVER GREATER THAN 100.4 F (38 C) OR HIGHER *CHILLS OR SWEATING *NAUSEA AND VOMITING THAT IS NOT CONTROLLED WITH YOUR NAUSEA MEDICATION *UNUSUAL SHORTNESS OF BREATH *UNUSUAL BRUISING OR BLEEDING *URINARY PROBLEMS (pain or burning when urinating, or frequent urination) *BOWEL PROBLEMS (unusual diarrhea, constipation, pain near the anus) TENDERNESS IN MOUTH AND THROAT WITH OR WITHOUT PRESENCE OF ULCERS (sore throat, sores in mouth, or a toothache) UNUSUAL RASH, SWELLING OR PAIN  UNUSUAL VAGINAL DISCHARGE OR ITCHING   Items with * indicate a potential emergency and should be followed up as soon as possible or go to the Emergency Department if any problems should occur.  Please show the CHEMOTHERAPY ALERT CARD or IMMUNOTHERAPY ALERT CARD  at check-in to the Emergency Department and triage nurse.  Should you have questions after your visit or need to cancel or reschedule your appointment, please contact [facility]  Dept: [phone]  and follow the prompts.  Office hours are 8:00 a.m. to 4:30 p.m. Monday - Friday. Please note that voicemails left after 4:00 p.m. may not be returned until the following business day.  We are closed weekends and major holidays. You have access to a nurse at all times for urgent questions. Please call the main number to the clinic Dept: [phone] and follow the prompts.   For any non-urgent questions, you may also contact your provider using MyChart. We now offer e-Visits for anyone 33 and older to request care online for non-urgent symptoms. For details visit [email or web address].   Also download the MyChart app! Go to the app store, search "MyChart", open the app, select [facility], and log in with your MyChart username and password.  Due to Covid, a mask is required upon entering the hospital/clinic. If you do not have a mask, one will be given to you upon arrival. For doctor visits, patients may have 1 support person aged 55 or older with them. For treatment visits, patients cannot have anyone with them due to current Covid guidelines and our immunocompromised population.

## 2021-07-16 DIAGNOSIS — I10 Essential (primary) hypertension: Secondary | ICD-10-CM | POA: Diagnosis not present

## 2021-07-16 DIAGNOSIS — M7989 Other specified soft tissue disorders: Secondary | ICD-10-CM | POA: Diagnosis not present

## 2021-07-16 DIAGNOSIS — C3490 Malignant neoplasm of unspecified part of unspecified bronchus or lung: Secondary | ICD-10-CM | POA: Diagnosis not present

## 2021-07-16 DIAGNOSIS — E039 Hypothyroidism, unspecified: Secondary | ICD-10-CM | POA: Diagnosis not present

## 2021-07-16 DIAGNOSIS — M170 Bilateral primary osteoarthritis of knee: Secondary | ICD-10-CM | POA: Diagnosis not present

## 2021-07-16 DIAGNOSIS — R636 Underweight: Secondary | ICD-10-CM | POA: Diagnosis not present

## 2021-07-16 DIAGNOSIS — N401 Enlarged prostate with lower urinary tract symptoms: Secondary | ICD-10-CM | POA: Diagnosis not present

## 2021-07-16 DIAGNOSIS — Z0001 Encounter for general adult medical examination with abnormal findings: Secondary | ICD-10-CM | POA: Diagnosis not present

## 2021-07-16 DIAGNOSIS — R0981 Nasal congestion: Secondary | ICD-10-CM | POA: Diagnosis not present

## 2021-07-17 ENCOUNTER — Other Ambulatory Visit: Payer: Self-pay | Admitting: Physician Assistant

## 2021-07-17 DIAGNOSIS — C3411 Malignant neoplasm of upper lobe, right bronchus or lung: Secondary | ICD-10-CM

## 2021-07-17 NOTE — Progress Notes (Signed)
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   Biomarker Findings Tumor Mutational Burden - 10 Muts/Mb Microsatellite status - MS-Stable Genomic Findings For a complete list of the genes assayed, please refer to the Appendix. KRAS G12C NFKBIA amplification NKX2-1 amplification RBM10 D630f*80 7 Disease relevant genes with no reportable alterations: ALK, BRAF, EGFR, ERBB2, MET, RET, ROS1   PDL1: 90%  PRIOR THERAPY:  Weekly concurrent chemoradiation with carboplatin for an AUC of 2 and paclitaxel 45 mg/m.  First dose on 12/30/2020.  Status post 6 cycles.  CURRENT THERAPY:  First-line treatment with immunotherapy with Libtayo (Cempilimab) 350 Mg IV every 3 weeks. First dose on 03/19/21. Status post 6 cycles.   INTERVAL HISTORY: Brian [age]NONE89y.o. male returns to the clinic today for a follow-up visit accompanied by his son in law. The patient is feeling fairly well today without any concerning complaints. He is tolerating his immunotherapy well without any concerning adverse side effects, although he sometimes has diarrhea 2-3 times per day. He states this is not daily and his diarrhea does respond to imodium; however, he ran out of imodium and is planning on picking more up on the way home. Denies abdominal pain or blood in the stool.  Denies nausea or vomiting. His last bowel movement was last night. Denies any fevers, chills, or night sweats.  He is followed by nutrition for his weight loss.  He has been gaining weight at his last few appointments. He is drinking boost as well as ensure 3x per day. He denies any nausea,  vomiting, or constipation.   He reports stable fatigue and dyspnea on exertion. He actually noticed he is able to walk farther without feeling short of breath compared to prior. He has a mild cold right now that is improving so he has a mild cough. Denies any headache or visual changes.  Denies any hemoptysis. Denies any rashes or skin changes.  The patient was supposed have a restaging CT scan performed today but it is not authorized by insurance yet.  The patient is here today for evaluation to review his scan results before starting cycle #7.    MEDICAL HISTORY: Past Medical History:  Diagnosis Date   Arthritis    Hypertension    Hyperthyroidism    lung ca 11/2020    ALLERGIES:  has No Known Allergies.  MEDICATIONS:  Current Outpatient Medications  Medication Sig Dispense Refill   gabapentin (NEURONTIN) 250 MG/5ML solution Take 3 mLs (150 mg total) by mouth at bedtime. 90 mL 2   labetalol (NORMODYNE) 200 MG tablet Take 200 mg by mouth 2 (two) times daily.     levothyroxine (SYNTHROID) 150 MCG tablet Take 150 mcg by mouth daily before breakfast.     loperamide (IMODIUM) 2 MG capsule Take by mouth as needed for diarrhea or loose stools.     naproxen (NAPROSYN) 500 MG tablet Take 500 mg by mouth 2 (two) times daily as needed for moderate pain.     Olmesartan-Amlodipine-HCTZ 40-10-12.5 MG TABS Take 1 tablet by mouth daily.     oxyCODONE-acetaminophen (PERCOCET/ROXICET) 5-325 MG tablet Take 1 tablet by mouth every 8 (eight) hours  as needed for severe pain. 30 tablet 0   prochlorperazine (COMPAZINE) 10 MG tablet Take 1 tablet (10 mg total) by mouth every 6 (six) hours as needed for nausea or vomiting. 30 tablet 0   tadalafil (CIALIS) 5 MG tablet Take 5 mg by mouth daily.     tamsulosin (FLOMAX) 0.4 MG CAPS capsule Take 0.4 mg by mouth daily.     No current facility-administered medications for this visit.    SURGICAL HISTORY:  Past Surgical History:  Procedure Laterality Date    COLONOSCOPY N/A 09/02/2016   Procedure: COLONOSCOPY;  Surgeon: Rogene Houston, MD;  [facility];  Service: Endoscopy;  Laterality: N/A;  830   NO PAST SURGERIES     POLYPECTOMY  09/02/2016   Procedure: POLYPECTOMY;  Surgeon: Rogene Houston, MD;  [facility];  Service: Endoscopy;;    REVIEW OF SYSTEMS:   Constitutional: Positive for stable fatigue. Negative for chills, decrease appetite, weight loss, and fever. HENT: Positive for nasal congestion. Negative for mouth sores, nosebleeds, or sore throat. Eyes: Negative for eye problems and icterus.  Respiratory: Positive for stable to improved shortness of breath with exertion. Positive for mild cough. Negative for hemoptysis and wheezing.   Cardiovascular:  Negative for chest pain and leg swelling.  Gastrointestinal: Positive for intermittent diarrhea. Negative for abdominal pain, constipation, nausea and vomiting.  Genitourinary: Negative for bladder incontinence, difficulty urinating, dysuria, frequency and hematuria.   Musculoskeletal: Negative for back pain, gait problem, neck pain and neck stiffness.  Skin: Negative for itching and rash.  Neurological: Negative for dizziness, extremity weakness, gait problem, headaches, light-headedness and seizures.  Hematological: Negative for adenopathy. Does not bruise/bleed easily.  Psychiatric/Behavioral: Negative for confusion, depression and sleep disturbance. The patient is not nervous/anxious.     PHYSICAL EXAMINATION:  Blood pressure (!) 146/67, pulse 77, temperature (!) 97.3 F (36.3 C), temperature source Tympanic, resp. rate 20, height _0  (1.88 m), weight 149 lb (67.6 kg), SpO2 96 %.  ECOG PERFORMANCE STATUS: 2  Physical Exam  Constitutional: Oriented to person, place, and time and thin appearing male and in no distress.  HENT:  Head: Normocephalic and atraumatic.  Mouth/Throat: Oropharynx is clear and moist. No oropharyngeal exudate.  No evidence of  thrush Eyes: Conjunctivae are normal. Right eye exhibits no discharge. Left eye exhibits no discharge. No scleral icterus.  Neck: Normal range of motion. Neck supple.  Cardiovascular: Normal rate, regular rhythm, normal heart sounds and intact distal pulses.   Pulmonary/Chest: Effort normal and breath sounds normal. No respiratory distress. No wheezes. No rales.  Abdominal: Soft. Bowel sounds are normal. Exhibits no distension and no mass. There is no tenderness.  Musculoskeletal: Normal range of motion. Exhibits no edema.  Lymphadenopathy:    No cervical adenopathy.  Neurological: Alert and oriented to person, place, and time. Exhibits muscle wasting.  The patient was examined in the wheelchair. Skin: Skin is warm and dry. No rash noted. Not diaphoretic. No erythema. No pallor.  Psychiatric: Mood, memory and judgment normal.  Vitals reviewed.  LABORATORY DATA: Lab Results  Component Value Date   WBC 3.3 (L) 07/24/2021   HGB 10.0 (L) 07/24/2021   HCT 30.8 (L) 07/24/2021   MCV 98.1 07/24/2021   PLT 147 (L) 07/24/2021      Chemistry      Component Value Date/Time   NA 139 07/24/2021 1013   K 4.5 07/24/2021 1013   CL 107 07/24/2021 1013   CO2 22 07/24/2021 1013  BUN 50 (H) 07/24/2021 1013   CREATININE 2.25 (H) 07/24/2021 1013      Component Value Date/Time   CALCIUM 8.8 (L) 07/24/2021 1013   ALKPHOS 87 07/24/2021 1013   AST 15 07/24/2021 1013   ALT 19 07/24/2021 1013   BILITOT 0.4 07/24/2021 1013       RADIOGRAPHIC STUDIES:  No results found.   ASSESSMENT/PLAN:  This is a very pleasant 87 year old African-American male diagnosed with a stage IVa (T4, N3, M1 B) non-small cell lung cancer, adenosquamous carcinoma in March 2022 and presented with large mass involving the lateral right upper lobe and right chest wall soft tissue in addition to right hilar, mediastinal and right subpectoral and supraclavicular lymphadenopathy with PD-L1 expression of 90%.   The patient is  status post a course of concurrent chemoradiation with carboplatin for an AUC of 2 and paclitaxel 45 mg per metered square.  He is status post 6 cycles.    The patient had evidence of disease progression following concurrent chemoradiation.  The patient is currently undergoing single agent immunotherapy with Libtayo IV every 3 weeks due to his PD-L1 expression of 90%.  The patient is status post 6 cycles.    Labs were reviewed.  He has baseline CKD. His creatinine is stable at 2.25. Recommend he proceed with cycle #7 as scheduled.   Advised to get his CT scan as soon as authorized by insurance to reassess his malignancy. We will review it at his next appointment, but of course, if any emergent findings, we would call the patient back in to the clinic to be re-evaluated sooner.   Regarding his diarrhea, he states it is responsive to imodium and occurs 2-3x per day on days it occurs. Advised to pick up imodium. If he develops diarrhea >5-6x per day not responsive to imodium or any associated symptoms such as fevers, abdominal pain, nausea, vomiting, blood in the stool, etc, then he was advised to call us for further instructions.   We will see him back for follow-up visit in 3 weeks for evaluation before starting cycle #8.   The patient was advised to call immediately if she has any concerning symptoms in the interval. The patient voices understanding of current disease status and treatment options and is in agreement with the current care plan. All questions were answered. The patient knows to call the clinic with any problems, questions or concerns. We can certainly see the patient much sooner if necessary         No orders of the defined types were placed in this encounter.     The total time spent in the appointment was 20-29 minutes  [name] L [facility]bot [facility], PA-C 07/24/21

## 2021-07-18 DIAGNOSIS — N184 Chronic kidney disease, stage 4 (severe): Secondary | ICD-10-CM | POA: Insufficient documentation

## 2021-07-21 ENCOUNTER — Encounter: Payer: Self-pay | Admitting: Internal Medicine

## 2021-07-23 ENCOUNTER — Ambulatory Visit (HOSPITAL_COMMUNITY): Payer: Medicare HMO

## 2021-07-23 ENCOUNTER — Other Ambulatory Visit: Payer: Self-pay | Admitting: Physician Assistant

## 2021-07-23 DIAGNOSIS — C3411 Malignant neoplasm of upper lobe, right bronchus or lung: Secondary | ICD-10-CM

## 2021-07-24 ENCOUNTER — Inpatient Hospital Stay: Payer: Medicare HMO

## 2021-07-24 ENCOUNTER — Other Ambulatory Visit: Payer: Self-pay

## 2021-07-24 ENCOUNTER — Inpatient Hospital Stay (HOSPITAL_BASED_OUTPATIENT_CLINIC_OR_DEPARTMENT_OTHER): Payer: Medicare HMO | Admitting: Physician Assistant

## 2021-07-24 VITALS — BP 146/67 | HR 77 | Temp 97.3°F | Resp 20 | Ht 74.0 in | Wt 149.0 lb

## 2021-07-24 DIAGNOSIS — C3411 Malignant neoplasm of upper lobe, right bronchus or lung: Secondary | ICD-10-CM

## 2021-07-24 DIAGNOSIS — Z79899 Other long term (current) drug therapy: Secondary | ICD-10-CM | POA: Diagnosis not present

## 2021-07-24 DIAGNOSIS — Z5112 Encounter for antineoplastic immunotherapy: Secondary | ICD-10-CM

## 2021-07-24 DIAGNOSIS — C770 Secondary and unspecified malignant neoplasm of lymph nodes of head, face and neck: Secondary | ICD-10-CM | POA: Diagnosis not present

## 2021-07-24 DIAGNOSIS — C7989 Secondary malignant neoplasm of other specified sites: Secondary | ICD-10-CM | POA: Diagnosis not present

## 2021-07-24 LAB — CBC WITH DIFFERENTIAL (CANCER CENTER ONLY)
Abs Immature Granulocytes: 0.02 10*3/uL (ref 0.00–0.07)
Basophils Absolute: 0 10*3/uL (ref 0.0–0.1)
Basophils Relative: 0 %
Eosinophils Absolute: 0.1 10*3/uL (ref 0.0–0.5)
Eosinophils Relative: 4 %
HCT: 30.8 % — ABNORMAL LOW (ref 39.0–52.0)
Hemoglobin: 10 g/dL — ABNORMAL LOW (ref 13.0–17.0)
Immature Granulocytes: 1 %
Lymphocytes Relative: 19 %
Lymphs Abs: 0.6 10*3/uL — ABNORMAL LOW (ref 0.7–4.0)
MCH: 31.8 pg (ref 26.0–34.0)
MCHC: 32.5 g/dL (ref 30.0–36.0)
MCV: 98.1 fL (ref 80.0–100.0)
Monocytes Absolute: 0.3 10*3/uL (ref 0.1–1.0)
Monocytes Relative: 8 %
Neutro Abs: 2.2 10*3/uL (ref 1.7–7.7)
Neutrophils Relative %: 68 %
Platelet Count: 147 10*3/uL — ABNORMAL LOW (ref 150–400)
RBC: 3.14 MIL/uL — ABNORMAL LOW (ref 4.22–5.81)
RDW: 15.1 % (ref 11.5–15.5)
WBC Count: 3.3 10*3/uL — ABNORMAL LOW (ref 4.0–10.5)
nRBC: 0 % (ref 0.0–0.2)

## 2021-07-24 LAB — CMP (CANCER CENTER ONLY)
ALT: 19 U/L (ref 0–44)
AST: 15 U/L (ref 15–41)
Albumin: 2.8 g/dL — ABNORMAL LOW (ref 3.5–5.0)
Alkaline Phosphatase: 87 U/L (ref 38–126)
Anion gap: 10 (ref 5–15)
BUN: 50 mg/dL — ABNORMAL HIGH (ref 8–23)
CO2: 22 mmol/L (ref 22–32)
Calcium: 8.8 mg/dL — ABNORMAL LOW (ref 8.9–10.3)
Chloride: 107 mmol/L (ref 98–111)
Creatinine: 2.25 mg/dL — ABNORMAL HIGH (ref 0.61–1.24)
GFR, Estimated: 28 mL/min — ABNORMAL LOW (ref >60)
Glucose, Bld: 120 mg/dL — ABNORMAL HIGH (ref 70–99)
Potassium: 4.5 mmol/L (ref 3.5–5.1)
Sodium: 139 mmol/L (ref 135–145)
Total Bilirubin: 0.4 mg/dL (ref 0.3–1.2)
Total Protein: 7.5 g/dL (ref 6.5–8.1)

## 2021-07-24 LAB — TSH: TSH: 4.59 u[IU]/mL — ABNORMAL HIGH (ref 0.320–4.118)

## 2021-07-24 MED ORDER — SODIUM CHLORIDE 0.9 % IV SOLN
350.0000 mg | Freq: Once | INTRAVENOUS | Status: AC
  Administered 2021-07-24: 350 mg via INTRAVENOUS
  Filled 2021-07-24: qty 7

## 2021-07-24 MED ORDER — SODIUM CHLORIDE 0.9 % IV SOLN: Freq: Once | INTRAVENOUS | Status: AC

## 2021-07-24 NOTE — Patient Instructions (Signed)
[facility]   Discharge Instructions: Thank you for choosing [facility] to provide your oncology and hematology care.   If you have a lab appointment with the [facility], please go directly to the [facility] and check in at the registration area.   Wear comfortable clothing and clothing appropriate for easy access to any Portacath or PICC line.   We strive to give you quality time with your provider. You may need to reschedule your appointment if you arrive late (15 or more minutes).  Arriving late affects you and other patients whose appointments are after yours.  Also, if you miss three or more appointments without notifying the office, you may be dismissed from the clinic at the provider's discretion.      For prescription refill requests, have your pharmacy contact our office and allow 72 hours for refills to be completed.    Today you received the following chemotherapy and/or immunotherapy agents: [id number].      To help prevent nausea and vomiting after your treatment, we encourage you to take your nausea medication as directed.  BELOW ARE SYMPTOMS THAT SHOULD BE REPORTED IMMEDIATELY: *FEVER GREATER THAN 100.4 F (38 C) OR HIGHER *CHILLS OR SWEATING *NAUSEA AND VOMITING THAT IS NOT CONTROLLED WITH YOUR NAUSEA MEDICATION *UNUSUAL SHORTNESS OF BREATH *UNUSUAL BRUISING OR BLEEDING *URINARY PROBLEMS (pain or burning when urinating, or frequent urination) *BOWEL PROBLEMS (unusual diarrhea, constipation, pain near the anus) TENDERNESS IN MOUTH AND THROAT WITH OR WITHOUT PRESENCE OF ULCERS (sore throat, sores in mouth, or a toothache) UNUSUAL RASH, SWELLING OR PAIN  UNUSUAL VAGINAL DISCHARGE OR ITCHING   Items with * indicate a potential emergency and should be followed up as soon as possible or go to the Emergency Department if any problems should occur.  Please show the CHEMOTHERAPY ALERT CARD or IMMUNOTHERAPY ALERT CARD at  check-in to the Emergency Department and triage nurse.  Should you have questions after your visit or need to cancel or reschedule your appointment, please contact [facility]  Dept: [phone]  and follow the prompts.  Office hours are 8:00 a.m. to 4:30 p.m. Monday - Friday. Please note that voicemails left after 4:00 p.m. may not be returned until the following business day.  We are closed weekends and major holidays. You have access to a nurse at all times for urgent questions. Please call the main number to the clinic Dept: [phone] and follow the prompts.   For any non-urgent questions, you may also contact your provider using MyChart. We now offer e-Visits for anyone 28 and older to request care online for non-urgent symptoms. For details visit [email or web address].   Also download the MyChart app! Go to the app store, search "MyChart", open the app, select [facility], and log in with your MyChart username and password.  Due to Covid, a mask is required upon entering the hospital/clinic. If you do not have a mask, one will be given to you upon arrival. For doctor visits, patients may have 1 support person aged 36 or older with them. For treatment visits, patients cannot have anyone with them due to current Covid guidelines and our immunocompromised population.

## 2021-07-24 NOTE — Progress Notes (Signed)
Per Cassie, PA, ok to treat with elevated scr.

## 2021-07-31 ENCOUNTER — Ambulatory Visit (HOSPITAL_COMMUNITY)
Admission: RE | Admit: 2021-07-31 | Discharge: 2021-07-31 | Disposition: A | Discharge status: Home / Self Care | Payer: Medicare HMO | Source: Ambulatory Visit | Attending: Physician Assistant | Admitting: Physician Assistant

## 2021-07-31 ENCOUNTER — Other Ambulatory Visit: Payer: Self-pay

## 2021-07-31 ENCOUNTER — Telehealth: Payer: Self-pay | Admitting: Physician Assistant

## 2021-07-31 DIAGNOSIS — J439 Emphysema, unspecified: Secondary | ICD-10-CM | POA: Diagnosis not present

## 2021-07-31 DIAGNOSIS — C3411 Malignant neoplasm of upper lobe, right bronchus or lung: Secondary | ICD-10-CM | POA: Insufficient documentation

## 2021-07-31 DIAGNOSIS — D7389 Other diseases of spleen: Secondary | ICD-10-CM | POA: Diagnosis not present

## 2021-07-31 DIAGNOSIS — I700 Atherosclerosis of aorta: Secondary | ICD-10-CM | POA: Diagnosis not present

## 2021-07-31 DIAGNOSIS — C3490 Malignant neoplasm of unspecified part of unspecified bronchus or lung: Secondary | ICD-10-CM | POA: Diagnosis not present

## 2021-07-31 DIAGNOSIS — N281 Cyst of kidney, acquired: Secondary | ICD-10-CM | POA: Diagnosis not present

## 2021-07-31 NOTE — Telephone Encounter (Signed)
I called the patient to review his scan since his next follow up is not for another 2 weeks. His daughter/emergency contact answered. She stated the best way to reach her father was through her. I let her know his scan showed improvement in his disease and there was no new or progressive disease. We will discuss in more detail when we see him for follow up but I didn't want him to worry waiting two weeks for the results. She was appreciative for the call.

## 2021-08-09 NOTE — Progress Notes (Deleted)
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  Biomarker Findings Tumor Mutational Burden - 10 Muts/Mb Microsatellite status - MS-Stable Genomic Findings For a complete list of the genes assayed, please refer to the Appendix. KRAS G12C NFKBIA amplification NKX2-1 amplification RBM10 [id number]*80 7 Disease relevant genes with no reportable alterations: ALK, BRAF, EGFR, ERBB2, MET, RET, ROS1   PDL1: 90%   PRIOR THERAPY: Weekly concurrent chemoradiation with carboplatin for an AUC of 2 and paclitaxel 45 mg/m.  First dose on 12/30/2020.  Status post 6 cycles.  CURRENT THERAPY: First-line treatment with immunotherapy with Libtayo (Cempilimab) 350 Mg IV every 3 weeks. First dose on 03/19/21. Status post 7 cycles.   INTERVAL HISTORY: Brian [id number]. male returns  to the clinic today for a follow-up visit accompanied by his son in law. The patient is feeling fairly well today without any concerning complaints. He is tolerating his immunotherapy well without any concerning adverse side effects, although he sometimes has diarrhea 2-3 times per day. ***. Denies abdominal pain or blood in the stool.  Denies nausea or vomiting. His last bowel movement was last night. Denies any fevers, chills, or night sweats.  He is followed by nutrition for his weight loss.  He has been gaining weight at his last few appointments. He is drinking boost as well as ensure 3x per day. He denies any nausea, vomiting, or constipation.   He reports stable fatigue and dyspnea on exertion. He actually noticed he is able to walk farther without feeling short of  breath compared to prior. He has a mild cold right now that is improving so he has a mild cough. Denies any headache or visual changes.  Denies any hemoptysis. Denies any rashes or skin changes. The patient had a restaging CT scan in the interval since his last appointment. He is here for evaluation and to review his scan results before starting cycle #8.   MEDICAL HISTORY: Past Medical History:  Diagnosis Date   Arthritis    Hypertension    Hyperthyroidism    lung ca 11/2020    ALLERGIES:  has No Known Allergies.  MEDICATIONS:  Current Outpatient Medications  Medication Sig Dispense Refill   gabapentin (NEURONTIN) 250 MG/5ML solution Take 3 mLs (150 mg total) by mouth at bedtime. 90 mL 2   labetalol (NORMODYNE) 200 MG tablet Take 200 mg by mouth 2 (two) times daily.     levothyroxine (SYNTHROID) 150 MCG tablet Take 150 mcg by mouth daily before breakfast.     loperamide (IMODIUM) 2 MG capsule Take by mouth as needed for diarrhea or loose stools.     naproxen (NAPROSYN) 500 MG tablet Take 500 mg by mouth 2 (two) times daily as needed for moderate pain.     Olmesartan-Amlodipine-HCTZ 40-10-12.5 MG TABS Take 1 tablet by mouth daily.     oxyCODONE-acetaminophen (PERCOCET/ROXICET) 5-325 MG tablet Take 1 tablet by mouth every 8 (eight) hours as needed for severe pain. 30 tablet 0   prochlorperazine (COMPAZINE) 10 MG tablet Take 1 tablet (10 mg total) by mouth every 6 (six) hours as needed for nausea or vomiting. 30 tablet 0   tadalafil (  CIALIS) 5 MG tablet Take 5 mg by mouth daily.     tamsulosin (FLOMAX) 0.4 MG CAPS capsule Take 0.4 mg by mouth daily.     No current facility-administered medications for this visit.    SURGICAL HISTORY:  Past Surgical History:  Procedure Laterality Date   COLONOSCOPY N/A 09/02/2016   Procedure: COLONOSCOPY;  Surgeon: Rogene Houston, MD;  Location: [facility];  Service: Endoscopy;  Laterality: N/A;  830   NO PAST SURGERIES     POLYPECTOMY  09/02/2016    Procedure: POLYPECTOMY;  Surgeon: Rogene Houston, MD;  Location: [facility];  Service: Endoscopy;;    REVIEW OF SYSTEMS:   Review of Systems  Constitutional: Negative for appetite change, chills, fatigue, fever and unexpected weight change.  HENT:   Negative for mouth sores, nosebleeds, sore throat and trouble swallowing.   Eyes: Negative for eye problems and icterus.  Respiratory: Negative for cough, hemoptysis, shortness of breath and wheezing.   Cardiovascular: Negative for chest pain and leg swelling.  Gastrointestinal: Negative for abdominal pain, constipation, diarrhea, nausea and vomiting.  Genitourinary: Negative for bladder incontinence, difficulty urinating, dysuria, frequency and hematuria.   Musculoskeletal: Negative for back pain, gait problem, neck pain and neck stiffness.  Skin: Negative for itching and rash.  Neurological: Negative for dizziness, extremity weakness, gait problem, headaches, light-headedness and seizures.  Hematological: Negative for adenopathy. Does not bruise/bleed easily.  Psychiatric/Behavioral: Negative for confusion, depression and sleep disturbance. The patient is not nervous/anxious.     PHYSICAL EXAMINATION:  There were no vitals taken for this visit.  ECOG PERFORMANCE STATUS: {CHL ONC ECOG [id number]  Physical Exam  Constitutional: Oriented to person, place, and time and well-developed, well-nourished, and in no distress. No distress.  HENT:  Head: Normocephalic and atraumatic.  Mouth/Throat: Oropharynx is clear and moist. No oropharyngeal exudate.  Eyes: Conjunctivae are normal. Right eye exhibits no discharge. Left eye exhibits no discharge. No scleral icterus.  Neck: Normal range of motion. Neck supple.  Cardiovascular: Normal rate, regular rhythm, normal heart sounds and intact distal pulses.   Pulmonary/Chest: Effort normal and breath sounds normal. No respiratory distress. No wheezes. No rales.  Abdominal: Soft. Bowel sounds  are normal. Exhibits no distension and no mass. There is no tenderness.  Musculoskeletal: Normal range of motion. Exhibits no edema.  Lymphadenopathy:    No cervical adenopathy.  Neurological: Alert and oriented to person, place, and time. Exhibits normal muscle tone. Gait normal. Coordination normal.  Skin: Skin is warm and dry. No rash noted. Not diaphoretic. No erythema. No pallor.  Psychiatric: Mood, memory and judgment normal.  Vitals reviewed.  LABORATORY DATA: Lab Results  Component Value Date   WBC 3.3 (L) 07/24/2021   HGB 10.0 (L) 07/24/2021   HCT 30.8 (L) 07/24/2021   MCV 98.1 07/24/2021   PLT 147 (L) 07/24/2021      Chemistry      Component Value Date/Time   NA 139 07/24/2021 1013   K 4.5 07/24/2021 1013   CL 107 07/24/2021 1013   CO2 22 07/24/2021 1013   BUN 50 (H) 07/24/2021 1013   CREATININE 2.25 (H) 07/24/2021 1013      Component Value Date/Time   CALCIUM 8.8 (L) 07/24/2021 1013   ALKPHOS 87 07/24/2021 1013   AST 15 07/24/2021 1013   ALT 19 07/24/2021 1013   BILITOT 0.4 07/24/2021 1013       RADIOGRAPHIC STUDIES:  CT Abdomen Wo Contrast  Result Date: 07/31/2021 CLINICAL DATA:  Primary Cancer Type: Lung Imaging Indication: Assess response to therapy Interval therapy since last imaging? Yes Initial Cancer Diagnosis Date: 12/12/2020; Established by: Biopsy-proven Detailed Pathology: Stage IVA non-small cell lung cancer, adenosquamous carcinoma. Primary Tumor location: Lateral right upper lobe and right chest wall. Surgeries: No. Chemotherapy: Yes; Ongoing? No; Most recent administration: 01/20/2021 Immunotherapy?  Yes; Type: Libtayo; Ongoing? Yes Radiation therapy? Yes; Date Range: 12/30/2020 - 02/13/2021; Target: Right chest EXAM: CT CHEST AND ABDOMEN WITHOUT CONTRAST TECHNIQUE: Multidetector CT imaging of the chest and abdomen was performed following the standard protocol without intravenous contrast. COMPARISON:  Most recent CT chest 05/20/2021.  11/25/2020  PET-CT. FINDINGS: CT CHEST FINDINGS WITHOUT CONTRAST Cardiovascular: Atherosclerotic calcification of the aorta. Mediastinum/Nodes: Several small mediastinal lymph nodes are not changed in size. For example LEFT lower paratracheal node measuring 9 mm. No RIGHT axillary adenopathy or sub pectoralis adenopathy. RIGHT lobe of thyroid gland is enlarged and extends into the upper RIGHT mediastinum posteriorly. No change Lungs/Pleura: The peripheral RIGHT chest wall mass measures 6.0 x 2.5 cm (image 18/series 504) decreased from 6.7 x 3.3 cm. There is again noted destruction of the adjacent third and fourth RIGHT ribs. Coarse thickened fibrotic change in consolidation with air bronchograms in the RIGHT upper lobe is similar comparison exam. There is decrease in loculated fluid in the RIGHT lung apex compared to prior. No new nodularity. The LEFT lung there is extensive centrilobular emphysema and paraseptal emphysema. No new nodularity. Musculoskeletal: No aggressive osseous lesion. CT ABDOMEN FINDINGS WITHOUT CONTRAST Hepatobiliary: No focal hepatic lesion on noncontrast exam. Pancreas: Normal pancreatic parenchymal intensity. No ductal dilatation or inflammation. Spleen: Low-attenuation lesion in the spleen measuring 19 mm is unchanged. Adrenals/urinary tract: Adrenal glands normal. Bilateral low-attenuation renal cystic lesions not changed. For Stomach/Bowel: Stomach and limited of the small bowel is unremarkable Vascular/Lymphatic: Abdominal aortic normal caliber. No retroperitoneal periportal lymphadenopathy. Musculoskeletal: RIGHT third fourth rib destruction unchanged. No new sites of new skeletal metastasis IMPRESSION: Chest Impression: 1. Interval decrease in size of RIGHT chest wall mass with associated rib destruction. 2. Stable fibrotic and consolidative pattern in the RIGHT upper lobe with air bronchograms. No evidence of new disease in the RIGHT lung. 3. Extensive bilateral centrilobular and paraseptal  emphysema again noted. 4. Interval decrease in loculated effusions in the RIGHT hemithorax. 5. Stable RIGHT thyroid goiter. 6.  Aortic Atherosclerosis (ICD10-I70.0). Abdomen / Pelvis Impression: 1. No evidence metastasis on noncontrast exam. 2. Low-density lesion the spleen is not changed. This lesion was not hypermetabolic on comparison PET-CT scan. Electronically Signed   By: Suzy Bouchard M.D.   On: 07/31/2021 11:28   CT Chest Wo Contrast  Result Date: 07/31/2021 CLINICAL DATA:  Primary Cancer Type: Lung Imaging Indication: Assess response to therapy Interval therapy since last imaging? Yes Initial Cancer Diagnosis Date: 12/12/2020; Established by: Biopsy-proven Detailed Pathology: Stage IVA non-small cell lung cancer, adenosquamous carcinoma. Primary Tumor location: Lateral right upper lobe and right chest wall. Surgeries: No. Chemotherapy: Yes; Ongoing? No; Most recent administration: 01/20/2021 Immunotherapy?  Yes; Type: Libtayo; Ongoing? Yes Radiation therapy? Yes; Date Range: 12/30/2020 - 02/13/2021; Target: Right chest EXAM: CT CHEST AND ABDOMEN WITHOUT CONTRAST TECHNIQUE: Multidetector CT imaging of the chest and abdomen was performed following the standard protocol without intravenous contrast. COMPARISON:  Most recent CT chest 05/20/2021.  11/25/2020 PET-CT. FINDINGS: CT CHEST FINDINGS WITHOUT CONTRAST Cardiovascular: Atherosclerotic calcification of the aorta. Mediastinum/Nodes: Several small mediastinal lymph nodes are not changed in size. For example LEFT lower paratracheal node measuring 9 mm. No  RIGHT axillary adenopathy or sub pectoralis adenopathy. RIGHT lobe of thyroid gland is enlarged and extends into the upper RIGHT mediastinum posteriorly. No change Lungs/Pleura: The peripheral RIGHT chest wall mass measures 6.0 x 2.5 cm (image 18/series 504) decreased from 6.7 x 3.3 cm. There is again noted destruction of the adjacent third and fourth RIGHT ribs. Coarse thickened fibrotic change in  consolidation with air bronchograms in the RIGHT upper lobe is similar comparison exam. There is decrease in loculated fluid in the RIGHT lung apex compared to prior. No new nodularity. The LEFT lung there is extensive centrilobular emphysema and paraseptal emphysema. No new nodularity. Musculoskeletal: No aggressive osseous lesion. CT ABDOMEN FINDINGS WITHOUT CONTRAST Hepatobiliary: No focal hepatic lesion on noncontrast exam. Pancreas: Normal pancreatic parenchymal intensity. No ductal dilatation or inflammation. Spleen: Low-attenuation lesion in the spleen measuring 19 mm is unchanged. Adrenals/urinary tract: Adrenal glands normal. Bilateral low-attenuation renal cystic lesions not changed. For Stomach/Bowel: Stomach and limited of the small bowel is unremarkable Vascular/Lymphatic: Abdominal aortic normal caliber. No retroperitoneal periportal lymphadenopathy. Musculoskeletal: RIGHT third fourth rib destruction unchanged. No new sites of new skeletal metastasis IMPRESSION: Chest Impression: 1. Interval decrease in size of RIGHT chest wall mass with associated rib destruction. 2. Stable fibrotic and consolidative pattern in the RIGHT upper lobe with air bronchograms. No evidence of new disease in the RIGHT lung. 3. Extensive bilateral centrilobular and paraseptal emphysema again noted. 4. Interval decrease in loculated effusions in the RIGHT hemithorax. 5. Stable RIGHT thyroid goiter. 6.  Aortic Atherosclerosis (ICD10-I70.0). Abdomen / Pelvis Impression: 1. No evidence metastasis on noncontrast exam. 2. Low-density lesion the spleen is not changed. This lesion was not hypermetabolic on comparison PET-CT scan. Electronically Signed   By: Suzy Bouchard M.D.   On: 07/31/2021 11:28     ASSESSMENT/PLAN:  This is a very pleasant 87 year old African-American male diagnosed with a stage IVa (T4, N3, M1 B) non-small cell lung cancer, adenosquamous carcinoma in March 2022 and presented with large mass involving the  lateral right upper lobe and right chest wall soft tissue in addition to right hilar, mediastinal and right subpectoral and supraclavicular lymphadenopathy with PD-L1 expression of 90%.  The patient is status post a course of concurrent chemoradiation with carboplatin for an AUC of 2 and paclitaxel 45 mg per metered square.  He is status post 6 cycles.    The patient had evidence of disease progression following concurrent chemoradiation.  The patient is currently undergoing single agent immunotherapy with Libtayo IV every 3 weeks due to his PD-L1 expression of 90%.  The patient is status post 7 cycles.   The patient was seen with Dr. Julien Nordmann today. Dr. Julien Nordmann personally and independently reviewed his scan and discussed the results with the patient. The scan showed no evidence of disease progression.    Labs were reviewed.  He has baseline CKD. His creatinine is stable at ***. Recommend he proceed with cycle #8 as scheduled.   Regarding his diarrhea, he states it is responsive to imodium and occurs 2-3x per day on days it occurs. Advised to pick up imodium. If he develops diarrhea >5-6x per day not responsive to imodium or any associated symptoms such as fevers, abdominal pain, nausea, vomiting, blood in the stool, etc, then he was advised to call us for further instructions.   We will see him back for follow-up visit in 3 weeks for evaluation before starting cycle #9.    The patient was advised to call immediately if he  has any concerning symptoms in the interval. The patient voices understanding of current disease status and treatment options and is in agreement with the current care plan. All questions were answered. The patient knows to call the clinic with any problems, questions or concerns. We can certainly see the patient much sooner if necessary        No orders of the defined types were placed in this encounter.    I spent {CHL ONC TIME VISIT - WCHEN:[id number]} counseling the  patient face to face. The total time spent in the appointment was {CHL ONC TIME VISIT - IRWER:[id number]}.  Leeah Politano L Findley Blankenbaker, PA-C 08/09/21

## 2021-08-14 ENCOUNTER — Inpatient Hospital Stay (HOSPITAL_BASED_OUTPATIENT_CLINIC_OR_DEPARTMENT_OTHER): Payer: Medicare HMO | Admitting: Internal Medicine

## 2021-08-14 ENCOUNTER — Other Ambulatory Visit: Payer: Self-pay

## 2021-08-14 ENCOUNTER — Inpatient Hospital Stay: Payer: Medicare HMO

## 2021-08-14 ENCOUNTER — Inpatient Hospital Stay: Payer: Medicare HMO | Attending: Internal Medicine

## 2021-08-14 VITALS — BP 145/77 | HR 64 | Temp 97.5°F | Resp 18 | Ht 74.0 in | Wt 152.2 lb

## 2021-08-14 DIAGNOSIS — Z5112 Encounter for antineoplastic immunotherapy: Secondary | ICD-10-CM | POA: Diagnosis not present

## 2021-08-14 DIAGNOSIS — C7989 Secondary malignant neoplasm of other specified sites: Secondary | ICD-10-CM | POA: Diagnosis not present

## 2021-08-14 DIAGNOSIS — Z79899 Other long term (current) drug therapy: Secondary | ICD-10-CM | POA: Diagnosis not present

## 2021-08-14 DIAGNOSIS — C493 Malignant neoplasm of connective and soft tissue of thorax: Secondary | ICD-10-CM | POA: Insufficient documentation

## 2021-08-14 DIAGNOSIS — C3411 Malignant neoplasm of upper lobe, right bronchus or lung: Secondary | ICD-10-CM

## 2021-08-14 LAB — CMP (CANCER CENTER ONLY)
ALT: 41 U/L (ref 0–44)
AST: 22 U/L (ref 15–41)
Albumin: 3 g/dL — ABNORMAL LOW (ref 3.5–5.0)
Alkaline Phosphatase: 92 U/L (ref 38–126)
Anion gap: 9 (ref 5–15)
BUN: 55 mg/dL — ABNORMAL HIGH (ref 8–23)
CO2: 22 mmol/L (ref 22–32)
Calcium: 8.4 mg/dL — ABNORMAL LOW (ref 8.9–10.3)
Chloride: 108 mmol/L (ref 98–111)
Creatinine: 2.51 mg/dL — ABNORMAL HIGH (ref 0.61–1.24)
GFR, Estimated: 24 mL/min — ABNORMAL LOW (ref >60)
Glucose, Bld: 83 mg/dL (ref 70–99)
Potassium: 4.3 mmol/L (ref 3.5–5.1)
Sodium: 139 mmol/L (ref 135–145)
Total Bilirubin: 0.3 mg/dL (ref 0.3–1.2)
Total Protein: 7.4 g/dL (ref 6.5–8.1)

## 2021-08-14 LAB — CBC WITH DIFFERENTIAL (CANCER CENTER ONLY)
Abs Immature Granulocytes: 0.01 10*3/uL (ref 0.00–0.07)
Basophils Absolute: 0 10*3/uL (ref 0.0–0.1)
Basophils Relative: 0 %
Eosinophils Absolute: 0.1 10*3/uL (ref 0.0–0.5)
Eosinophils Relative: 4 %
HCT: 30.7 % — ABNORMAL LOW (ref 39.0–52.0)
Hemoglobin: 10 g/dL — ABNORMAL LOW (ref 13.0–17.0)
Immature Granulocytes: 0 %
Lymphocytes Relative: 21 %
Lymphs Abs: 0.6 10*3/uL — ABNORMAL LOW (ref 0.7–4.0)
MCH: 31.5 pg (ref 26.0–34.0)
MCHC: 32.6 g/dL (ref 30.0–36.0)
MCV: 96.8 fL (ref 80.0–100.0)
Monocytes Absolute: 0.4 10*3/uL (ref 0.1–1.0)
Monocytes Relative: 14 %
Neutro Abs: 1.6 10*3/uL — ABNORMAL LOW (ref 1.7–7.7)
Neutrophils Relative %: 61 %
Platelet Count: 142 10*3/uL — ABNORMAL LOW (ref 150–400)
RBC: 3.17 MIL/uL — ABNORMAL LOW (ref 4.22–5.81)
RDW: 15.2 % (ref 11.5–15.5)
WBC Count: 2.6 10*3/uL — ABNORMAL LOW (ref 4.0–10.5)
nRBC: 0 % (ref 0.0–0.2)

## 2021-08-14 MED ORDER — SODIUM CHLORIDE 0.9 % IV SOLN
350.0000 mg | Freq: Once | INTRAVENOUS | Status: AC
  Administered 2021-08-14: 350 mg via INTRAVENOUS
  Filled 2021-08-14: qty 7

## 2021-08-14 MED ORDER — SODIUM CHLORIDE 0.9 % IV SOLN: Freq: Once | INTRAVENOUS | Status: AC

## 2021-08-14 NOTE — Progress Notes (Signed)
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  Biomarker Findings Tumor Mutational Burden - 10 Muts/Mb Microsatellite status - MS-Stable Genomic Findings For a complete list of the genes assayed, please refer to the Appendix. KRAS G12C NFKBIA amplification NKX2-1 amplification RBM10 D613f*80 7 Disease relevant genes with no reportable alterations: ALK, BRAF, EGFR, ERBB2, MET, RET, ROS1   PDL1: 90%   PRIOR THERAPY:  Weekly concurrent chemoradiation with carboplatin for an AUC of 2 and paclitaxel 45 mg/m.  First dose on 12/30/2020.  Status post 6 cycles.   CURRENT THERAPY: First-line treatment with immunotherapy with Libtayo (Cempilimab) 350 Mg IV every 3 weeks.  Status post 7 cycles.  INTERVAL HISTORY: [name] returns to the clinic today for follow-up visit accompanied by his son.  The patient is feeling fine today with no concerning complaints.  He denied having any current chest pain, shortness of breath, cough or hemoptysis.  He denied having any fever or chills.  He has no nausea, vomiting, diarrhea or constipation.  He has no headache or visual changes.  He denied having any significant weight loss or night sweats.  He continues to tolerate his treatment with Libtayo (Cempilimab) fairly well.  The patient had repeat CT scan of the chest, abdomen and pelvis recently and he is here for evaluation and discussion of his scan results and treatment options.  MEDICAL HISTORY: Past Medical History:  Diagnosis Date   Arthritis    Hypertension     Hyperthyroidism    lung ca 11/2020    ALLERGIES:  has No Known Allergies.  MEDICATIONS:  Current Outpatient Medications  Medication Sig Dispense Refill   gabapentin (NEURONTIN) 250 MG/5ML solution Take 3 mLs (150 mg total) by mouth at bedtime. 90 mL 2   labetalol (NORMODYNE) 200 MG tablet Take 200 mg by mouth 2 (two) times daily.     levothyroxine (SYNTHROID) 150 MCG tablet Take 150 mcg by mouth daily before breakfast.     loperamide (IMODIUM) 2 MG capsule Take by mouth as needed for diarrhea or loose stools.     naproxen (NAPROSYN) 500 MG tablet Take 500 mg by mouth 2 (two) times daily as needed for moderate pain.     Olmesartan-Amlodipine-HCTZ 40-10-12.5 MG TABS Take 1 tablet by mouth daily.     oxyCODONE-acetaminophen (PERCOCET/ROXICET) 5-325 MG tablet Take 1 tablet by mouth every 8 (eight) hours as needed for severe pain. 30 tablet 0   prochlorperazine (COMPAZINE) 10 MG tablet Take 1 tablet (10 mg total) by mouth every 6 (six) hours as needed for nausea or vomiting. 30 tablet 0   tadalafil (CIALIS) 5 MG tablet Take 5 mg by mouth daily.     tamsulosin (FLOMAX) 0.4 MG CAPS capsule Take 0.4 mg by mouth daily.     No current facility-administered medications for this visit.    SURGICAL HISTORY:  Past Surgical History:  Procedure Laterality Date   COLONOSCOPY N/A 09/02/2016   Procedure: COLONOSCOPY;  Surgeon: NRogene Houston MD;  Location: [facility];  Service: Endoscopy;  Laterality: N/A;  830   NO PAST  SURGERIES     POLYPECTOMY  09/02/2016   Procedure: POLYPECTOMY;  Surgeon: Rogene Houston, MD;  Location: [facility];  Service: Endoscopy;;    REVIEW OF SYSTEMS:  Constitutional: positive for fatigue Eyes: negative Ears, nose, mouth, throat, and face: negative Respiratory: negative Cardiovascular: negative Gastrointestinal: negative Genitourinary:negative Integument/breast: negative Hematologic/lymphatic: negative Musculoskeletal:positive for muscle  weakness Neurological: negative Behavioral/Psych: negative Endocrine: negative Allergic/Immunologic: negative   PHYSICAL EXAMINATION: General appearance: alert, cooperative, and no distress Head: Normocephalic, without obvious abnormality, atraumatic Neck: no adenopathy, no JVD, supple, symmetrical, trachea midline, and thyroid not enlarged, symmetric, no tenderness/mass/nodules Lymph nodes: Cervical, supraclavicular, and axillary nodes normal. Resp: clear to auscultation bilaterally Back: symmetric, no curvature. ROM normal. No CVA tenderness. Cardio: regular rate and rhythm, S1, S2 normal, no murmur, click, rub or gallop GI: soft, non-tender; bowel sounds normal; no masses,  no organomegaly Extremities: extremities normal, atraumatic, no cyanosis or edema Neurologic: Alert and oriented X 3, normal strength and tone. Normal symmetric reflexes. Normal coordination and gait  ECOG PERFORMANCE STATUS: 1 - Symptomatic but completely ambulatory  Blood pressure (!) 145/77, pulse 64, temperature (!) 97.5 F (36.4 C), temperature source Tympanic, resp. rate 18, height '6\' 2"'  (1.88 m), weight 69 kg, SpO2 96 %.  LABORATORY DATA: Lab Results  Component Value Date   WBC 2.6 (L) 08/14/2021   HGB 10.0 (L) 08/14/2021   HCT 30.7 (L) 08/14/2021   MCV 96.8 08/14/2021   PLT 142 (L) 08/14/2021      Chemistry      Component Value Date/Time   NA 139 08/14/2021 1400   K 4.3 08/14/2021 1400   CL 108 08/14/2021 1400   CO2 22 08/14/2021 1400   BUN 55 (H) 08/14/2021 1400   CREATININE 2.51 (H) 08/14/2021 1400      Component Value Date/Time   CALCIUM 8.4 (L) 08/14/2021 1400   ALKPHOS 92 08/14/2021 1400   AST 22 08/14/2021 1400   ALT 41 08/14/2021 1400   BILITOT 0.3 08/14/2021 1400       RADIOGRAPHIC STUDIES: CT Abdomen Wo Contrast  Result Date: 07/31/2021 CLINICAL DATA:  Primary Cancer Type: Lung Imaging Indication: Assess response to therapy Interval therapy since last imaging? Yes Initial  Cancer Diagnosis Date: 12/12/2020; Established by: Biopsy-proven Detailed Pathology: Stage IVA non-small cell lung cancer, adenosquamous carcinoma. Primary Tumor location: Lateral right upper lobe and right chest wall. Surgeries: No. Chemotherapy: Yes; Ongoing? No; Most recent administration: 01/20/2021 Immunotherapy?  Yes; Type: Libtayo; Ongoing? Yes Radiation therapy? Yes; Date Range: 12/30/2020 - 02/13/2021; Target: Right chest EXAM: CT CHEST AND ABDOMEN WITHOUT CONTRAST TECHNIQUE: Multidetector CT imaging of the chest and abdomen was performed following the standard protocol without intravenous contrast. COMPARISON:  Most recent CT chest 05/20/2021.  11/25/2020 PET-CT. FINDINGS: CT CHEST FINDINGS WITHOUT CONTRAST Cardiovascular: Atherosclerotic calcification of the aorta. Mediastinum/Nodes: Several small mediastinal lymph nodes are not changed in size. For example LEFT lower paratracheal node measuring 9 mm. No RIGHT axillary adenopathy or sub pectoralis adenopathy. RIGHT lobe of thyroid gland is enlarged and extends into the upper RIGHT mediastinum posteriorly. No change Lungs/Pleura: The peripheral RIGHT chest wall mass measures 6.0 x 2.5 cm (image 18/series 504) decreased from 6.7 x 3.3 cm. There is again noted destruction of the adjacent third and fourth RIGHT ribs. Coarse thickened fibrotic change in consolidation with air bronchograms in the RIGHT upper lobe is similar comparison exam. There is decrease in loculated fluid in the RIGHT lung apex compared to prior. No new nodularity. The LEFT  lung there is extensive centrilobular emphysema and paraseptal emphysema. No new nodularity. Musculoskeletal: No aggressive osseous lesion. CT ABDOMEN FINDINGS WITHOUT CONTRAST Hepatobiliary: No focal hepatic lesion on noncontrast exam. Pancreas: Normal pancreatic parenchymal intensity. No ductal dilatation or inflammation. Spleen: Low-attenuation lesion in the spleen measuring 19 mm is unchanged. Adrenals/urinary  tract: Adrenal glands normal. Bilateral low-attenuation renal cystic lesions not changed. For Stomach/Bowel: Stomach and limited of the small bowel is unremarkable Vascular/Lymphatic: Abdominal aortic normal caliber. No retroperitoneal periportal lymphadenopathy. Musculoskeletal: RIGHT third fourth rib destruction unchanged. No new sites of new skeletal metastasis IMPRESSION: Chest Impression: 1. Interval decrease in size of RIGHT chest wall mass with associated rib destruction. 2. Stable fibrotic and consolidative pattern in the RIGHT upper lobe with air bronchograms. No evidence of new disease in the RIGHT lung. 3. Extensive bilateral centrilobular and paraseptal emphysema again noted. 4. Interval decrease in loculated effusions in the RIGHT hemithorax. 5. Stable RIGHT thyroid goiter. 6.  Aortic Atherosclerosis (ICD10-I70.0). Abdomen / Pelvis Impression: 1. No evidence metastasis on noncontrast exam. 2. Low-density lesion the spleen is not changed. This lesion was not hypermetabolic on comparison PET-CT scan. Electronically Signed   By: Suzy Bouchard M.D.   On: 07/31/2021 11:28   CT Chest Wo Contrast  Result Date: 07/31/2021 CLINICAL DATA:  Primary Cancer Type: Lung Imaging Indication: Assess response to therapy Interval therapy since last imaging? Yes Initial Cancer Diagnosis Date: 12/12/2020; Established by: Biopsy-proven Detailed Pathology: Stage IVA non-small cell lung cancer, adenosquamous carcinoma. Primary Tumor location: Lateral right upper lobe and right chest wall. Surgeries: No. Chemotherapy: Yes; Ongoing? No; Most recent administration: 01/20/2021 Immunotherapy?  Yes; Type: Libtayo; Ongoing? Yes Radiation therapy? Yes; Date Range: 12/30/2020 - 02/13/2021; Target: Right chest EXAM: CT CHEST AND ABDOMEN WITHOUT CONTRAST TECHNIQUE: Multidetector CT imaging of the chest and abdomen was performed following the standard protocol without intravenous contrast. COMPARISON:  Most recent CT chest  05/20/2021.  11/25/2020 PET-CT. FINDINGS: CT CHEST FINDINGS WITHOUT CONTRAST Cardiovascular: Atherosclerotic calcification of the aorta. Mediastinum/Nodes: Several small mediastinal lymph nodes are not changed in size. For example LEFT lower paratracheal node measuring 9 mm. No RIGHT axillary adenopathy or sub pectoralis adenopathy. RIGHT lobe of thyroid gland is enlarged and extends into the upper RIGHT mediastinum posteriorly. No change Lungs/Pleura: The peripheral RIGHT chest wall mass measures 6.0 x 2.5 cm (image 18/series 504) decreased from 6.7 x 3.3 cm. There is again noted destruction of the adjacent third and fourth RIGHT ribs. Coarse thickened fibrotic change in consolidation with air bronchograms in the RIGHT upper lobe is similar comparison exam. There is decrease in loculated fluid in the RIGHT lung apex compared to prior. No new nodularity. The LEFT lung there is extensive centrilobular emphysema and paraseptal emphysema. No new nodularity. Musculoskeletal: No aggressive osseous lesion. CT ABDOMEN FINDINGS WITHOUT CONTRAST Hepatobiliary: No focal hepatic lesion on noncontrast exam. Pancreas: Normal pancreatic parenchymal intensity. No ductal dilatation or inflammation. Spleen: Low-attenuation lesion in the spleen measuring 19 mm is unchanged. Adrenals/urinary tract: Adrenal glands normal. Bilateral low-attenuation renal cystic lesions not changed. For Stomach/Bowel: Stomach and limited of the small bowel is unremarkable Vascular/Lymphatic: Abdominal aortic normal caliber. No retroperitoneal periportal lymphadenopathy. Musculoskeletal: RIGHT third fourth rib destruction unchanged. No new sites of new skeletal metastasis IMPRESSION: Chest Impression: 1. Interval decrease in size of RIGHT chest wall mass with associated rib destruction. 2. Stable fibrotic and consolidative pattern in the RIGHT upper lobe with air bronchograms. No evidence of new disease in the RIGHT lung. 3. Extensive bilateral  centrilobular and paraseptal emphysema again noted. 4. Interval decrease in loculated effusions in the RIGHT hemithorax. 5. Stable RIGHT thyroid goiter. 6.  Aortic Atherosclerosis (ICD10-I70.0). Abdomen / Pelvis Impression: 1. No evidence metastasis on noncontrast exam. 2. Low-density lesion the spleen is not changed. This lesion was not hypermetabolic on comparison PET-CT scan. Electronically Signed   By: Suzy Bouchard M.D.   On: 07/31/2021 11:28    ASSESSMENT AND PLAN: This is a very pleasant 87 years old African-American [name] recently diagnosed with a stage IVA (T4, N3, M1b) non-small cell lung cancer, adenosquamous carcinoma in March 2022 and presented with large mass involving the lateral right upper lobe and right chest wall soft tissue in addition to right hilar, mediastinal and right subpectoral and supraclavicular lymphadenopathy with PD-L1 expression of 90%.  The patient underwent a course of concurrent chemoradiation with weekly carboplatin for AUC of 2 and paclitaxel 45 Mg/M2 status post 6 cycles. He tolerated the previous course of his concurrent chemoradiation fairly well except for fatigue. Unfortunately his CT scan of the chest after the induction phase showed interval enlargement of a large mass centered at the periphery of the right upper lobe and extending into the adjacent chest wall measuring 8.4 x 6.7 cm with bony destruction of the right third and fourth ribs substantially increased compared to the prior examination and the findings are consistent with worsened malignancy. The patient has PD-L1 expression of 90% and I felt he will be a good candidate for treatment with first-line single agent immunotherapy. I recommended for him treatment with Libtayo (Cempilimab) 350 Mg IV every 3 weeks.  He is status post 7 cycles of treatment. The patient continues to tolerate his treatment with Libtayo (Cempilimab) fairly well with no concerning complaints. He had repeat CT scan of the chest,  abdomen pelvis performed recently.  I personally and independently reviewed the scan and discussed the results with the patient and his son. His scan showed no concerning findings for disease recurrence or metastasis. I recommended for him to continue his current treatment with Libtayo (Cempilimab) and he will proceed with cycle #8 today. I will see him back for follow-up visit in 3 weeks for evaluation before the next cycle of his treatment. The patient was advised to call immediately if he has any other concerning symptoms in the interval. The patient voices understanding of current disease status and treatment options and is in agreement with the current care plan.  All questions were answered. The patient knows to call the clinic with any problems, questions or concerns. We can certainly see the patient much sooner if necessary.   Disclaimer: This note was dictated with voice recognition software. Similar sounding words can inadvertently be transcribed and may not be corrected upon review.

## 2021-08-14 NOTE — Patient Instructions (Signed)
[facility]  Discharge Instructions: Thank you for choosing [facility] to provide your oncology and hematology care.   If you have a lab appointment with the [facility], please go directly to the [facility] and check in at the registration area.   Wear comfortable clothing and clothing appropriate for easy access to any Portacath or PICC line.   We strive to give you quality time with your provider. You may need to reschedule your appointment if you arrive late (15 or more minutes).  Arriving late affects you and other patients whose appointments are after yours.  Also, if you miss three or more appointments without notifying the office, you may be dismissed from the clinic at the provider's discretion.      For prescription refill requests, have your pharmacy contact our office and allow 72 hours for refills to be completed.    Today you received the following chemotherapy and/or immunotherapy agents: Libtayo   To help prevent nausea and vomiting after your treatment, we encourage you to take your nausea medication as directed.  BELOW ARE SYMPTOMS THAT SHOULD BE REPORTED IMMEDIATELY: *FEVER GREATER THAN 100.4 F (38 C) OR HIGHER *CHILLS OR SWEATING *NAUSEA AND VOMITING THAT IS NOT CONTROLLED WITH YOUR NAUSEA MEDICATION *UNUSUAL SHORTNESS OF BREATH *UNUSUAL BRUISING OR BLEEDING *URINARY PROBLEMS (pain or burning when urinating, or frequent urination) *BOWEL PROBLEMS (unusual diarrhea, constipation, pain near the anus) TENDERNESS IN MOUTH AND THROAT WITH OR WITHOUT PRESENCE OF ULCERS (sore throat, sores in mouth, or a toothache) UNUSUAL RASH, SWELLING OR PAIN  UNUSUAL VAGINAL DISCHARGE OR ITCHING   Items with * indicate a potential emergency and should be followed up as soon as possible or go to the Emergency Department if any problems should occur.  Please show the CHEMOTHERAPY ALERT CARD or IMMUNOTHERAPY ALERT CARD at check-in to the  Emergency Department and triage nurse.  Should you have questions after your visit or need to cancel or reschedule your appointment, please contact [facility]  Dept: [phone]  and follow the prompts.  Office hours are 8:00 a.m. to 4:30 p.m. Monday - Friday. Please note that voicemails left after 4:00 p.m. may not be returned until the following business day.  We are closed weekends and major holidays. You have access to a nurse at all times for urgent questions. Please call the main number to the clinic Dept: [phone] and follow the prompts.   For any non-urgent questions, you may also contact your provider using MyChart. We now offer e-Visits for anyone 73 and older to request care online for non-urgent symptoms. For details visit [email or web address].   Also download the MyChart app! Go to the app store, search "MyChart", open the app, select [facility], and log in with your MyChart username and password.  Due to Covid, a mask is required upon entering the hospital/clinic. If you do not have a mask, one will be given to you upon arrival. For doctor visits, patients may have 1 support person aged 14 or older with them. For treatment visits, patients cannot have anyone with them due to current Covid guidelines and our immunocompromised population.

## 2021-08-14 NOTE — Progress Notes (Signed)
OK to trt w/ elevated SrCr per MD

## 2021-08-15 LAB — TSH: TSH: 7.359 u[IU]/mL — ABNORMAL HIGH (ref 0.320–4.118)

## 2021-09-01 NOTE — Progress Notes (Signed)
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   Biomarker Findings Tumor Mutational Burden - 10 Muts/Mb Microsatellite status - MS-Stable Genomic Findings For a complete list of the genes assayed, please refer to the Appendix. KRAS G12C NFKBIA amplification NKX2-1 amplification RBM10 D653f*80 7 Disease relevant genes with no reportable alterations: ALK, BRAF, EGFR, ERBB2, MET, RET, ROS1   PDL1: 90%  PRIOR THERAPY: Weekly concurrent chemoradiation with carboplatin for an AUC of 2 and paclitaxel 45 mg/m.  First dose on 12/30/2020.  Status post 6 cycles.  CURRENT THERAPY: First-line treatment with immunotherapy with Libtayo (Cempilimab) 350 Mg IV every 3 weeks. First dose on 03/19/21. Status post 8 cycles.   INTERVAL HISTORY: Brian GALLARDO87y.o. male returns to the clinic today for a follow-up visit accompanied by his son in law. The patient is feeling fairly well today without any concerning complaints except he has some areas of hypopigmentation on his face. Denies pain or itching. Denies every having this before. He has mild/minimal itching. Reports some dry skin. He is tolerating his immunotherapy well without any concerning adverse side effects, although he sometimes has diarrhea 2-3 times per day. He takes imodium with improvement in his symptoms. Denies abdominal pain or blood in the stool. His last restaging CT scan did not show any inflammatory changes in the bowel. Denies nausea or vomiting. Denies any fevers, chills, or night sweats.  He is followed by nutrition for his weight loss.  He has been gaining weight at his  last few appointments. He reports stable fatigue and dyspnea on exertion. He denies coughing. Denies any headache or visual changes.  Denies any hemoptysis or chest pain. He is here for evaluation before starting cycle #9   MEDICAL HISTORY: Past Medical History:  Diagnosis Date   Arthritis    Hypertension    Hyperthyroidism    lung ca 11/2020    ALLERGIES:  has No Known Allergies.  MEDICATIONS:  Current Outpatient Medications  Medication Sig Dispense Refill   gabapentin (NEURONTIN) 250 MG/5ML solution Take 3 mLs (150 mg total) by mouth at bedtime. 90 mL 2   labetalol (NORMODYNE) 200 MG tablet Take 200 mg by mouth 2 (two) times daily.     levothyroxine (SYNTHROID) 150 MCG tablet Take 150 mcg by mouth daily before breakfast.     loperamide (IMODIUM) 2 MG capsule Take by mouth as needed for diarrhea or loose stools.     naproxen (NAPROSYN) 500 MG tablet Take 500 mg by mouth 2 (two) times daily as needed for moderate pain.     Olmesartan-Amlodipine-HCTZ 40-10-12.5 MG TABS Take 1 tablet by mouth daily.     oxyCODONE-acetaminophen (PERCOCET/ROXICET) 5-325 MG tablet Take 1 tablet by mouth every 8 (eight) hours as needed for severe pain. 30 tablet 0   prochlorperazine (COMPAZINE) 10 MG tablet Take 1 tablet (10 mg total) by mouth every 6 (six) hours as needed for nausea or vomiting. 30 tablet 0   tadalafil (CIALIS) 5 MG tablet Take 5 mg by mouth daily.     tamsulosin (FLOMAX) 0.4 MG CAPS capsule Take 0.4 mg by mouth daily.     No current facility-administered medications for  this visit.    SURGICAL HISTORY:  Past Surgical History:  Procedure Laterality Date   COLONOSCOPY N/A 09/02/2016   Procedure: COLONOSCOPY;  Surgeon: Rogene Houston, MD;  [facility];  Service: Endoscopy;  Laterality: N/A;  830   NO PAST SURGERIES     POLYPECTOMY  09/02/2016   Procedure: POLYPECTOMY;  Surgeon: Rogene Houston, MD;  Location: [facility];  Service: Endoscopy;;    REVIEW OF SYSTEMS:    Constitutional: Positive for stable fatigue. Negative for chills, decrease appetite, weight loss, and fever. HENT:  Negative for mouth sores, nosebleeds, or sore throat. Eyes: Negative for eye problems and icterus.  Respiratory: Positive for stable shortness of breath with exertion. Negative for hemoptysis, cough, and wheezing.   Cardiovascular:  Negative for chest pain and leg swelling.  Gastrointestinal: Positive for intermittent diarrhea. Negative for abdominal pain, constipation, nausea and vomiting.  Genitourinary: Negative for bladder incontinence, difficulty urinating, dysuria, frequency and hematuria.   Musculoskeletal: Negative for back pain, gait problem, neck pain and neck stiffness.  Skin: Negative for itching and rash. Positive for hypopigmentation on face.  Neurological: Negative for dizziness, extremity weakness, gait problem, headaches, light-headedness and seizures.  Hematological: Negative for adenopathy. Does not bruise/bleed easily.  Psychiatric/Behavioral: Negative for confusion, depression and sleep disturbance. The patient is not nervous/anxious.     PHYSICAL EXAMINATION:  Blood pressure (!) 153/80, pulse 71, temperature 98.1 F (36.7 C), temperature source Temporal, resp. rate 18, height '6\' 2"'  (1.88 m), weight 155 lb 12.8 oz (70.7 kg), SpO2 97 %.  ECOG PERFORMANCE STATUS: 2  Physical Exam  Constitutional: Oriented to person, place, and time and thin appearing male and in no distress.  HENT:  Head: Normocephalic and atraumatic.  Mouth/Throat: Oropharynx is clear and moist. No oropharyngeal exudate.  No evidence of thrush Eyes: Conjunctivae are normal. Right eye exhibits no discharge. Left eye exhibits no discharge. No scleral icterus.  Neck: Normal range of motion. Neck supple.  Cardiovascular: Normal rate, regular rhythm, normal heart sounds and intact distal pulses.   Pulmonary/Chest: Effort normal and breath sounds normal. No respiratory distress. No wheezes.  No rales.  Abdominal: Soft. Bowel sounds are normal. Exhibits no distension and no mass. There is no tenderness.  Musculoskeletal: Normal range of motion. Exhibits no edema.  Lymphadenopathy:    No cervical adenopathy.  Neurological: Alert and oriented to person, place, and time. Exhibits muscle wasting.  The patient was examined in the wheelchair. Skin: Hypopigmentation on his face. Skin is warm and dry. No rash noted. Not diaphoretic. No erythema. No pallor.  Psychiatric: Mood, memory and judgment normal.  Vitals reviewed.  LABORATORY DATA: Lab Results  Component Value Date   WBC 2.7 (L) 09/04/2021   HGB 10.3 (L) 09/04/2021   HCT 31.7 (L) 09/04/2021   MCV 96.9 09/04/2021   PLT 136 (L) 09/04/2021      Chemistry      Component Value Date/Time   NA 140 09/04/2021 1404   K 4.5 09/04/2021 1404   CL 110 09/04/2021 1404   CO2 22 09/04/2021 1404   BUN 52 (H) 09/04/2021 1404   CREATININE 2.34 (H) 09/04/2021 1404      Component Value Date/Time   CALCIUM 8.4 (L) 09/04/2021 1404   ALKPHOS 91 09/04/2021 1404   AST 24 09/04/2021 1404   ALT 33 09/04/2021 1404   BILITOT 0.4 09/04/2021 1404       RADIOGRAPHIC STUDIES:  No results found.   ASSESSMENT/PLAN:  This is a  very pleasant 88 year old African-American male diagnosed with a stage IVa (T4, N3, M1 B) non-small cell lung cancer, adenosquamous carcinoma in March 2022 and presented with large mass involving the lateral right upper lobe and right chest wall soft tissue in addition to right hilar, mediastinal and right subpectoral and supraclavicular lymphadenopathy with PD-L1 expression of 90%.   The patient is status post a course of concurrent chemoradiation with carboplatin for an AUC of 2 and paclitaxel 45 mg per metered square.  He is status post 6 cycles.    The patient had evidence of disease progression following concurrent chemoradiation.   The patient is currently undergoing single agent immunotherapy with Libtayo IV  every 3 weeks due to his PD-L1 expression of 90%.  The patient is status post 8 cycles.    Labs were reviewed.  He has baseline CKD with creatinine at 2.34. Recommend he proceed with cycle #9 as scheduled.    Regarding his diarrhea, he states it is responsive to imodium and occurs 2-3x per day on days it occurs. Advised to pick up imodium. If he develops diarrhea >5-6x per day not responsive to imodium or any associated symptoms such as fevers, abdominal pain, nausea, vomiting, blood in the stool, etc, then he was advised to call us for further instructions.   We will see him back for follow-up visit in 3 weeks for evaluation before starting cycle #10.    For the hypopigmentation, unclear but maybe related to immunotherapy. The patient is not symptomatic. Advised to monitor for now. He is going to try to use more lotion. If every itching, can use hydrocortisone cream.   The patient was advised to call immediately if she has any concerning symptoms in the interval. The patient voices understanding of current disease status and treatment options and is in agreement with the current care plan. All questions were answered. The patient knows to call the clinic with any problems, questions or concerns. We can certainly see the patient much sooner if necessary   No orders of the defined types were placed in this encounter.    The total time spent in the appointment was 20-29 minutes.   [name] L Vicky Schleich, PA-C 09/04/21

## 2021-09-04 ENCOUNTER — Inpatient Hospital Stay: Payer: Medicare HMO | Attending: Internal Medicine

## 2021-09-04 ENCOUNTER — Inpatient Hospital Stay (HOSPITAL_BASED_OUTPATIENT_CLINIC_OR_DEPARTMENT_OTHER): Payer: Medicare HMO | Admitting: Physician Assistant

## 2021-09-04 ENCOUNTER — Inpatient Hospital Stay: Payer: Medicare HMO

## 2021-09-04 ENCOUNTER — Encounter: Payer: Self-pay | Admitting: Internal Medicine

## 2021-09-04 ENCOUNTER — Other Ambulatory Visit: Payer: Self-pay

## 2021-09-04 VITALS — BP 153/80 | HR 71 | Temp 98.1°F | Resp 18 | Ht 74.0 in | Wt 155.8 lb

## 2021-09-04 DIAGNOSIS — Z5112 Encounter for antineoplastic immunotherapy: Secondary | ICD-10-CM | POA: Diagnosis not present

## 2021-09-04 DIAGNOSIS — C3411 Malignant neoplasm of upper lobe, right bronchus or lung: Secondary | ICD-10-CM

## 2021-09-04 DIAGNOSIS — C7989 Secondary malignant neoplasm of other specified sites: Secondary | ICD-10-CM | POA: Diagnosis not present

## 2021-09-04 DIAGNOSIS — C778 Secondary and unspecified malignant neoplasm of lymph nodes of multiple regions: Secondary | ICD-10-CM | POA: Insufficient documentation

## 2021-09-04 DIAGNOSIS — Z79899 Other long term (current) drug therapy: Secondary | ICD-10-CM | POA: Diagnosis not present

## 2021-09-04 LAB — CMP (CANCER CENTER ONLY)
ALT: 33 U/L (ref 0–44)
AST: 24 U/L (ref 15–41)
Albumin: 3.2 g/dL — ABNORMAL LOW (ref 3.5–5.0)
Alkaline Phosphatase: 91 U/L (ref 38–126)
Anion gap: 8 (ref 5–15)
BUN: 52 mg/dL — ABNORMAL HIGH (ref 8–23)
CO2: 22 mmol/L (ref 22–32)
Calcium: 8.4 mg/dL — ABNORMAL LOW (ref 8.9–10.3)
Chloride: 110 mmol/L (ref 98–111)
Creatinine: 2.34 mg/dL — ABNORMAL HIGH (ref 0.61–1.24)
GFR, Estimated: 27 mL/min — ABNORMAL LOW (ref >60)
Glucose, Bld: 83 mg/dL (ref 70–99)
Potassium: 4.5 mmol/L (ref 3.5–5.1)
Sodium: 140 mmol/L (ref 135–145)
Total Bilirubin: 0.4 mg/dL (ref 0.3–1.2)
Total Protein: 7.4 g/dL (ref 6.5–8.1)

## 2021-09-04 LAB — CBC WITH DIFFERENTIAL (CANCER CENTER ONLY)
Abs Immature Granulocytes: 0.01 10*3/uL (ref 0.00–0.07)
Basophils Absolute: 0 10*3/uL (ref 0.0–0.1)
Basophils Relative: 1 %
Eosinophils Absolute: 0.1 10*3/uL (ref 0.0–0.5)
Eosinophils Relative: 3 %
HCT: 31.7 % — ABNORMAL LOW (ref 39.0–52.0)
Hemoglobin: 10.3 g/dL — ABNORMAL LOW (ref 13.0–17.0)
Immature Granulocytes: 0 %
Lymphocytes Relative: 20 %
Lymphs Abs: 0.6 10*3/uL — ABNORMAL LOW (ref 0.7–4.0)
MCH: 31.5 pg (ref 26.0–34.0)
MCHC: 32.5 g/dL (ref 30.0–36.0)
MCV: 96.9 fL (ref 80.0–100.0)
Monocytes Absolute: 0.3 10*3/uL (ref 0.1–1.0)
Monocytes Relative: 13 %
Neutro Abs: 1.7 10*3/uL (ref 1.7–7.7)
Neutrophils Relative %: 63 %
Platelet Count: 136 10*3/uL — ABNORMAL LOW (ref 150–400)
RBC: 3.27 MIL/uL — ABNORMAL LOW (ref 4.22–5.81)
RDW: 15.2 % (ref 11.5–15.5)
WBC Count: 2.7 10*3/uL — ABNORMAL LOW (ref 4.0–10.5)
nRBC: 0 % (ref 0.0–0.2)

## 2021-09-04 LAB — TSH: TSH: 6.874 u[IU]/mL — ABNORMAL HIGH (ref 0.320–4.118)

## 2021-09-04 MED ORDER — SODIUM CHLORIDE 0.9 % IV SOLN
350.0000 mg | Freq: Once | INTRAVENOUS | Status: AC
  Administered 2021-09-04: 350 mg via INTRAVENOUS
  Filled 2021-09-04: qty 7

## 2021-09-04 MED ORDER — SODIUM CHLORIDE 0.9 % IV SOLN: Freq: Once | INTRAVENOUS | Status: AC

## 2021-09-04 NOTE — Progress Notes (Signed)
Creatinine 2.34.  Per MD ok to proceed with treatment.

## 2021-09-04 NOTE — Patient Instructions (Signed)
[facility]  Discharge Instructions: Thank you for choosing [facility] to provide your oncology and hematology care.   If you have a lab appointment with the [facility], please go directly to the [facility] and check in at the registration area.   Wear comfortable clothing and clothing appropriate for easy access to any Portacath or PICC line.   We strive to give you quality time with your provider. You may need to reschedule your appointment if you arrive late (15 or more minutes).  Arriving late affects you and other patients whose appointments are after yours.  Also, if you miss three or more appointments without notifying the office, you may be dismissed from the clinic at the provider's discretion.      For prescription refill requests, have your pharmacy contact our office and allow 72 hours for refills to be completed.    Today you received the following chemotherapy and/or immunotherapy agents Libtayo      To help prevent nausea and vomiting after your treatment, we encourage you to take your nausea medication as directed.  BELOW ARE SYMPTOMS THAT SHOULD BE REPORTED IMMEDIATELY: *FEVER GREATER THAN 100.4 F (38 C) OR HIGHER *CHILLS OR SWEATING *NAUSEA AND VOMITING THAT IS NOT CONTROLLED WITH YOUR NAUSEA MEDICATION *UNUSUAL SHORTNESS OF BREATH *UNUSUAL BRUISING OR BLEEDING *URINARY PROBLEMS (pain or burning when urinating, or frequent urination) *BOWEL PROBLEMS (unusual diarrhea, constipation, pain near the anus) TENDERNESS IN MOUTH AND THROAT WITH OR WITHOUT PRESENCE OF ULCERS (sore throat, sores in mouth, or a toothache) UNUSUAL RASH, SWELLING OR PAIN  UNUSUAL VAGINAL DISCHARGE OR ITCHING   Items with * indicate a potential emergency and should be followed up as soon as possible or go to the Emergency Department if any problems should occur.  Please show the CHEMOTHERAPY ALERT CARD or IMMUNOTHERAPY ALERT CARD at check-in to the  Emergency Department and triage nurse.  Should you have questions after your visit or need to cancel or reschedule your appointment, please contact [facility]  Dept: [phone]  and follow the prompts.  Office hours are 8:00 a.m. to 4:30 p.m. Monday - Friday. Please note that voicemails left after 4:00 p.m. may not be returned until the following business day.  We are closed weekends and major holidays. You have access to a nurse at all times for urgent questions. Please call the main number to the clinic Dept: [phone] and follow the prompts.   For any non-urgent questions, you may also contact your provider using MyChart. We now offer e-Visits for anyone 61 and older to request care online for non-urgent symptoms. For details visit [email or web address].   Also download the MyChart app! Go to the app store, search "MyChart", open the app, select [facility], and log in with your MyChart username and password.  Due to Covid, a mask is required upon entering the hospital/clinic. If you do not have a mask, one will be given to you upon arrival. For doctor visits, patients may have 1 support person aged 75 or older with them. For treatment visits, patients cannot have anyone with them due to current Covid guidelines and our immunocompromised population.

## 2021-09-18 NOTE — Progress Notes (Signed)
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   Biomarker Findings Tumor Mutational Burden - 10 Muts/Mb Microsatellite status - MS-Stable Genomic Findings For a complete list of the genes assayed, please refer to the Appendix. KRAS G12C NFKBIA amplification NKX2-1 amplification RBM10 D680f*80 7 Disease relevant genes with no reportable alterations: ALK, BRAF, EGFR, ERBB2, MET, RET, ROS1   PDL1: 90%  PRIOR THERAPY: Weekly concurrent chemoradiation with carboplatin for an AUC of 2 and paclitaxel 45 mg/m.  First dose on 12/30/2020.  Status post 6 cycles  CURRENT THERAPY: First-line treatment with immunotherapy with Libtayo (Cempilimab) 350 Mg IV every 3 weeks. First dose on 03/19/21. Status post 9 cycles.  INTERVAL HISTORY: Brian HAWES87y.o. male returns to the clinic today for a follow-up visit accompanied by his son-in-law.  The patient is feeling fairly well today without any concern complaints.  The patient is currently undergoing immunotherapy with Libtayo.  He is tolerating treatment well without any concerning adverse side effects. He denies any fever, chills, or night sweats.  He is followed by member the nutritionist team for his weight loss although he has been gaining weight and his last few appointments.  He reports stable fatigue. He reports dyspnea on exertion which is stable to slightly worse progressively over the last few months. Denies any cough or hemoptysis. He mentions when he gets a "cold or something" he may develop right sided chest discomfort that is non-exertional. He does not have any pain at this time.  He denies nausea, vomiting, or diarrhea. He has some mild constipation and will take stool softener if needed. Denies any headache or visual changes. He still has some areas of skin hypopigmentation on his skin.  He is here today for evaluation and repeat blood work before his next cycle of treatment with cycle #10.    MEDICAL HISTORY: Past Medical History:  Diagnosis Date   Arthritis    Hypertension    Hyperthyroidism    lung ca 11/2020    ALLERGIES:  has No Known Allergies.  MEDICATIONS:  Current Outpatient Medications  Medication Sig Dispense Refill   gabapentin (NEURONTIN) 250 MG/5ML solution Take 3 mLs (150 mg total) by mouth at bedtime. 90 mL 2   labetalol (NORMODYNE) 200 MG tablet Take 200 mg by mouth 2 (two) times daily.     levothyroxine (SYNTHROID) 150 MCG tablet Take 150 mcg by mouth daily before breakfast.     loperamide (IMODIUM) 2 MG capsule Take by mouth as needed for diarrhea or loose stools.     naproxen (NAPROSYN) 500 MG tablet Take 500 mg by mouth 2 (two) times daily as needed for moderate pain.     Olmesartan-Amlodipine-HCTZ 40-10-12.5 MG TABS Take 1 tablet by mouth daily.     oxyCODONE-acetaminophen (PERCOCET/ROXICET) 5-325 MG tablet Take 1 tablet by mouth every 8 (eight) hours as needed for severe pain. 30 tablet 0   prochlorperazine (COMPAZINE) 10 MG tablet Take 1 tablet (10 mg total) by mouth every 6 (six) hours as needed for nausea or vomiting. 30 tablet 0   tadalafil (CIALIS) 5 MG tablet Take 5 mg by mouth daily.     tamsulosin (FLOMAX)  0.4 MG CAPS capsule Take 0.4 mg by mouth daily.     No current facility-administered medications for this visit.    SURGICAL HISTORY:  Past Surgical History:  Procedure Laterality Date   COLONOSCOPY N/A 09/02/2016   Procedure: COLONOSCOPY;  Surgeon: Rogene Houston, MD;  [facility];  Service: Endoscopy;  Laterality: N/A;  830   NO PAST SURGERIES     POLYPECTOMY  09/02/2016   Procedure: POLYPECTOMY;  Surgeon:  Rogene Houston, MD;  [facility];  Service: Endoscopy;;    REVIEW OF SYSTEMS:   Constitutional: Positive for stable fatigue. Negative for chills, decrease appetite, weight loss, and fever. HENT:  Negative for mouth sores, nosebleeds, or sore throat. Eyes: Negative for eye problems and icterus.  Respiratory: Positive for stable shortness of breath with exertion. Negative for hemoptysis, cough, and wheezing.  Cardiovascular: Negative for chest pain and leg swelling.  Gastrointestinal: Positive for mild constipation. Negative for abdominal pain, diarrhea, nausea and vomiting.  Genitourinary: Negative for bladder incontinence, difficulty urinating, dysuria, frequency and hematuria.   Musculoskeletal: Negative for back pain, gait problem, neck pain and neck stiffness.  Skin: Negative for itching and rash.  Neurological: Negative for dizziness, extremity weakness, gait problem, headaches, light-headedness and seizures.  Hematological: Negative for adenopathy. Does not bruise/bleed easily.  Psychiatric/Behavioral: Negative for confusion, depression and sleep disturbance. The patient is not nervous/anxious.     PHYSICAL EXAMINATION:  Blood pressure (!) 144/72, pulse 70, temperature (!) 97.4 F (36.3 C), temperature source Tympanic, resp. rate 19, height 6' 2"  (1.88 m), weight 156 lb 14.4 oz (71.2 kg), SpO2 94 %.  ECOG PERFORMANCE STATUS: 2  Physical Exam  Constitutional: Oriented to person, place, and time and thin appearing male and in no distress.  HENT:  Head: Normocephalic and atraumatic.  Mouth/Throat: Oropharynx is clear and moist. No oropharyngeal exudate.  No evidence of thrush Eyes: Conjunctivae are normal. Right eye exhibits no discharge. Left eye exhibits no discharge. No scleral icterus.  Neck: Normal range of motion. Neck supple.  Cardiovascular: Normal rate, regular rhythm, normal heart sounds and intact distal pulses.   Pulmonary/Chest: Effort normal and breath  sounds normal. No respiratory distress. No wheezes. No rales.  Abdominal: Soft. Bowel sounds are normal. Exhibits no distension and no mass. There is no tenderness.  Musculoskeletal: Normal range of motion. Exhibits no edema.  Lymphadenopathy:    No cervical adenopathy.  Neurological: Alert and oriented to person, place, and time. Exhibits muscle wasting.  The patient was examined in the wheelchair. Skin: Hypopigmentation on his face. Skin is warm and dry. No rash noted. Not diaphoretic. No erythema. No pallor.  Psychiatric: Mood, memory and judgment normal.  Vitals reviewed.  LABORATORY DATA: Lab Results  Component Value Date   WBC 2.4 (L) 09/25/2021   HGB 10.6 (L) 09/25/2021   HCT 33.3 (L) 09/25/2021   MCV 96.2 09/25/2021   PLT 152 09/25/2021      Chemistry      Component Value Date/Time   NA 139 09/25/2021 1050   K 4.4 09/25/2021 1050   CL 108 09/25/2021 1050   CO2 25 09/25/2021 1050   BUN 50 (H) 09/25/2021 1050   CREATININE 2.66 (H) 09/25/2021 1050      Component Value Date/Time   CALCIUM 8.9 09/25/2021 1050   ALKPHOS 79 09/25/2021 1050   AST 19 09/25/2021 1050   ALT 24 09/25/2021 1050   BILITOT 0.3 09/25/2021 1050       RADIOGRAPHIC STUDIES:  No results found.   ASSESSMENT/PLAN:  This is a very pleasant 87 year old African-American male diagnosed with a stage IVa (T4, N3, M1 B) non-small cell lung cancer, adenosquamous carcinoma in March 2022 and presented with large mass involving the lateral right upper lobe and right chest wall soft tissue in addition to right hilar, mediastinal and right subpectoral and supraclavicular lymphadenopathy with PD-L1 expression of 90%.   The patient is status post a course of concurrent chemoradiation with carboplatin for an AUC of 2 and paclitaxel 45 mg per metered square.  He is status post 6 cycles.    The patient had evidence of disease progression following concurrent chemoradiation.  The patient is currently undergoing  single agent immunotherapy with Libtayo IV every 3 weeks due to his PD-L1 expression of 90%.  The patient is status post 9 cycles.   Labs were reviewed.  He has baseline CKD with creatinine of 2.66, although it is slighly worse today. Encouraged the patient to hydrate. ANC is slightly low at 1.4 but adequate to proceed with immunotherapy today. Recommend he proceed with cycle #10 as scheduled.   We will arrange for restaging CT scan the chest, abdomen, pelvis prior to starting his next cycle of treatment in 3 weeks. This will be ordered without contrast due to his renal insuffiencey.   We will see him back for follow-up visit in 3 weeks for evaluation before starting cycle #11.  We will re-evaluate his lungs/breathing and response to treatment on his upcoming scan.   The patient was advised to call immediately if he has any concerning symptoms in the interval. The patient voices understanding of current disease status and treatment options and is in agreement with the current care plan. All questions were answered. The patient knows to call the clinic with any problems, questions or concerns. We can certainly see the patient much sooner if necessary           Orders Placed This Encounter  Procedures   CT Chest Wo Contrast    Standing Status:   Future    Standing Expiration Date:   09/25/2022    Order Specific Question:   Preferred imaging location?    Answer:   [facility]   CT Abdomen Pelvis Wo Contrast    Standing Status:   Future    Standing Expiration Date:   09/25/2022    Order Specific Question:   Preferred imaging location?    Answer:   [facility]    Order Specific Question:   Is Oral Contrast requested for this exam?    Answer:   Yes, Per Radiology protocol     The total time spent in the appointment was 20-29 minutes.   Latriece Anstine L Brian Seeling, PA-C 09/25/21

## 2021-09-25 ENCOUNTER — Encounter: Payer: Self-pay | Admitting: Physician Assistant

## 2021-09-25 ENCOUNTER — Inpatient Hospital Stay: Payer: Medicare HMO

## 2021-09-25 ENCOUNTER — Other Ambulatory Visit: Payer: Self-pay

## 2021-09-25 ENCOUNTER — Inpatient Hospital Stay (HOSPITAL_BASED_OUTPATIENT_CLINIC_OR_DEPARTMENT_OTHER): Payer: Medicare HMO | Admitting: Physician Assistant

## 2021-09-25 VITALS — BP 144/72 | HR 70 | Temp 97.4°F | Resp 19 | Ht 74.0 in | Wt 156.9 lb

## 2021-09-25 DIAGNOSIS — C3411 Malignant neoplasm of upper lobe, right bronchus or lung: Secondary | ICD-10-CM

## 2021-09-25 DIAGNOSIS — C7989 Secondary malignant neoplasm of other specified sites: Secondary | ICD-10-CM | POA: Diagnosis not present

## 2021-09-25 DIAGNOSIS — Z5112 Encounter for antineoplastic immunotherapy: Secondary | ICD-10-CM | POA: Diagnosis not present

## 2021-09-25 DIAGNOSIS — Z79899 Other long term (current) drug therapy: Secondary | ICD-10-CM | POA: Diagnosis not present

## 2021-09-25 DIAGNOSIS — C778 Secondary and unspecified malignant neoplasm of lymph nodes of multiple regions: Secondary | ICD-10-CM | POA: Diagnosis not present

## 2021-09-25 LAB — CMP (CANCER CENTER ONLY)
ALT: 24 U/L (ref 0–44)
AST: 19 U/L (ref 15–41)
Albumin: 3.3 g/dL — ABNORMAL LOW (ref 3.5–5.0)
Alkaline Phosphatase: 79 U/L (ref 38–126)
Anion gap: 6 (ref 5–15)
BUN: 50 mg/dL — ABNORMAL HIGH (ref 8–23)
CO2: 25 mmol/L (ref 22–32)
Calcium: 8.9 mg/dL (ref 8.9–10.3)
Chloride: 108 mmol/L (ref 98–111)
Creatinine: 2.66 mg/dL — ABNORMAL HIGH (ref 0.61–1.24)
GFR, Estimated: 23 mL/min — ABNORMAL LOW (ref >60)
Glucose, Bld: 88 mg/dL (ref 70–99)
Potassium: 4.4 mmol/L (ref 3.5–5.1)
Sodium: 139 mmol/L (ref 135–145)
Total Bilirubin: 0.3 mg/dL (ref 0.3–1.2)
Total Protein: 7.4 g/dL (ref 6.5–8.1)

## 2021-09-25 LAB — CBC WITH DIFFERENTIAL (CANCER CENTER ONLY)
Abs Immature Granulocytes: 0.01 10*3/uL (ref 0.00–0.07)
Basophils Absolute: 0 10*3/uL (ref 0.0–0.1)
Basophils Relative: 0 %
Eosinophils Absolute: 0.1 10*3/uL (ref 0.0–0.5)
Eosinophils Relative: 4 %
HCT: 33.3 % — ABNORMAL LOW (ref 39.0–52.0)
Hemoglobin: 10.6 g/dL — ABNORMAL LOW (ref 13.0–17.0)
Immature Granulocytes: 0 %
Lymphocytes Relative: 23 %
Lymphs Abs: 0.5 10*3/uL — ABNORMAL LOW (ref 0.7–4.0)
MCH: 30.6 pg (ref 26.0–34.0)
MCHC: 31.8 g/dL (ref 30.0–36.0)
MCV: 96.2 fL (ref 80.0–100.0)
Monocytes Absolute: 0.3 10*3/uL (ref 0.1–1.0)
Monocytes Relative: 13 %
Neutro Abs: 1.4 10*3/uL — ABNORMAL LOW (ref 1.7–7.7)
Neutrophils Relative %: 60 %
Platelet Count: 152 10*3/uL (ref 150–400)
RBC: 3.46 MIL/uL — ABNORMAL LOW (ref 4.22–5.81)
RDW: 14.7 % (ref 11.5–15.5)
WBC Count: 2.4 10*3/uL — ABNORMAL LOW (ref 4.0–10.5)
nRBC: 0 % (ref 0.0–0.2)

## 2021-09-25 LAB — TSH: TSH: 6.842 u[IU]/mL — ABNORMAL HIGH (ref 0.320–4.118)

## 2021-09-25 MED ORDER — SODIUM CHLORIDE 0.9 % IV SOLN
350.0000 mg | Freq: Once | INTRAVENOUS | Status: AC
  Administered 2021-09-25: 13:00:00 350 mg via INTRAVENOUS
  Filled 2021-09-25: qty 7

## 2021-09-25 MED ORDER — SODIUM CHLORIDE 0.9 % IV SOLN: Freq: Once | INTRAVENOUS | Status: AC

## 2021-09-25 NOTE — Progress Notes (Signed)
Per Cassie H., PA okay to proceed with treatment today with ANC 1.4 and creatine 2.66.

## 2021-09-25 NOTE — Patient Instructions (Signed)
[facility]  Discharge Instructions: Thank you for choosing [facility] to provide your oncology and hematology care.   If you have a lab appointment with the [facility], please go directly to the [facility] and check in at the registration area.   Wear comfortable clothing and clothing appropriate for easy access to any Portacath or PICC line.   We strive to give you quality time with your provider. You may need to reschedule your appointment if you arrive late (15 or more minutes).  Arriving late affects you and other patients whose appointments are after yours.  Also, if you miss three or more appointments without notifying the office, you may be dismissed from the clinic at the providers discretion.      For prescription refill requests, have your pharmacy contact our office and allow 72 hours for refills to be completed.    Today you received the following chemotherapy and/or immunotherapy agents Libtayo      To help prevent nausea and vomiting after your treatment, we encourage you to take your nausea medication as directed.  BELOW ARE SYMPTOMS THAT SHOULD BE REPORTED IMMEDIATELY: *FEVER GREATER THAN 100.4 F (38 C) OR HIGHER *CHILLS OR SWEATING *NAUSEA AND VOMITING THAT IS NOT CONTROLLED WITH YOUR NAUSEA MEDICATION *UNUSUAL SHORTNESS OF BREATH *UNUSUAL BRUISING OR BLEEDING *URINARY PROBLEMS (pain or burning when urinating, or frequent urination) *BOWEL PROBLEMS (unusual diarrhea, constipation, pain near the anus) TENDERNESS IN MOUTH AND THROAT WITH OR WITHOUT PRESENCE OF ULCERS (sore throat, sores in mouth, or a toothache) UNUSUAL RASH, SWELLING OR PAIN  UNUSUAL VAGINAL DISCHARGE OR ITCHING   Items with * indicate a potential emergency and should be followed up as soon as possible or go to the Emergency Department if any problems should occur.  Please show the CHEMOTHERAPY ALERT CARD or IMMUNOTHERAPY ALERT CARD at check-in to the  Emergency Department and triage nurse.  Should you have questions after your visit or need to cancel or reschedule your appointment, please contact [facility]  Dept: [phone]  and follow the prompts.  Office hours are 8:00 a.m. to 4:30 p.m. Monday - Friday. Please note that voicemails left after 4:00 p.m. may not be returned until the following business day.  We are closed weekends and major holidays. You have access to a nurse at all times for urgent questions. Please call the main number to the clinic Dept: [phone] and follow the prompts.   For any non-urgent questions, you may also contact your provider using MyChart. We now offer e-Visits for anyone 85 and older to request care online for non-urgent symptoms. For details visit [email or web address].   Also download the MyChart app! Go to the app store, search "MyChart", open the app, select [facility], and log in with your MyChart username and password.  Due to Covid, a mask is required upon entering the hospital/clinic. If you do not have a mask, one will be given to you upon arrival. For doctor visits, patients may have 1 support person aged 15 or older with them. For treatment visits, patients cannot have anyone with them due to current Covid guidelines and our immunocompromised population.

## 2021-10-03 ENCOUNTER — Telehealth: Payer: Self-pay | Admitting: Internal Medicine

## 2021-10-03 NOTE — Telephone Encounter (Signed)
Sch per 1/3 los,pt daughter aware

## 2021-10-13 ENCOUNTER — Ambulatory Visit (HOSPITAL_COMMUNITY)
Admission: RE | Admit: 2021-10-13 | Discharge: 2021-10-13 | Disposition: A | Discharge status: Home / Self Care | Payer: Medicare HMO | Source: Ambulatory Visit | Attending: Physician Assistant | Admitting: Physician Assistant

## 2021-10-13 DIAGNOSIS — N3289 Other specified disorders of bladder: Secondary | ICD-10-CM | POA: Diagnosis not present

## 2021-10-13 DIAGNOSIS — J439 Emphysema, unspecified: Secondary | ICD-10-CM | POA: Diagnosis not present

## 2021-10-13 DIAGNOSIS — Z85110 Personal history of malignant carcinoid tumor of bronchus and lung: Secondary | ICD-10-CM | POA: Diagnosis not present

## 2021-10-13 DIAGNOSIS — N2889 Other specified disorders of kidney and ureter: Secondary | ICD-10-CM | POA: Diagnosis not present

## 2021-10-13 DIAGNOSIS — J90 Pleural effusion, not elsewhere classified: Secondary | ICD-10-CM | POA: Diagnosis not present

## 2021-10-13 DIAGNOSIS — C3411 Malignant neoplasm of upper lobe, right bronchus or lung: Secondary | ICD-10-CM | POA: Insufficient documentation

## 2021-10-13 DIAGNOSIS — I700 Atherosclerosis of aorta: Secondary | ICD-10-CM | POA: Diagnosis not present

## 2021-10-13 DIAGNOSIS — R918 Other nonspecific abnormal finding of lung field: Secondary | ICD-10-CM | POA: Diagnosis not present

## 2021-10-13 DIAGNOSIS — D7389 Other diseases of spleen: Secondary | ICD-10-CM | POA: Diagnosis not present

## 2021-10-13 NOTE — Progress Notes (Signed)
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   Biomarker Findings Tumor Mutational Burden - 10 Muts/Mb Microsatellite status - MS-Stable Genomic Findings For a complete list of the genes assayed, please refer to the Appendix. KRAS G12C NFKBIA amplification NKX2-1 amplification RBM10 D636f*80 7 Disease relevant genes with no reportable alterations: ALK, BRAF, EGFR, ERBB2, MET, RET, ROS1   PDL1: 90%  PRIOR THERAPY: Weekly concurrent chemoradiation with carboplatin for an AUC of 2 and paclitaxel 45 mg/m.  First dose on 12/30/2020.  Status post 6 cycles  CURRENT THERAPY: First-line treatment with immunotherapy with Libtayo (Cempilimab) 350 Mg IV every 3 weeks. First dose on 03/19/21. Status post 10 cycles.  INTERVAL HISTORY: Brian [id number].o. male returns to the clinic today for a follow-up visit accompanied by his son-in-law.  The patient is feeling fairly well today without any concern complaints. The patient is currently undergoing immunotherapy with Libtayo.  He is tolerating treatment well without any concerning adverse side effects. He denies any fever, chills, or night sweats.  He is followed by member the nutritionist team for his weight loss although he has been gaining weight and his last few appointments or staying stable.  He reports stable fatigue. He reports dyspnea on exertion which is stable. The patient denies sick contacts. He thinks he may have coughed a little more yesterday. He states he produces white phlegm. Denies any chest pain or hemoptysis. No sore throat or nasal congestion. He notes he has chronic  arthritis in his right knee. He is wondering what his non-invasive options are. He denies nausea, vomiting, or diarrhea. If he has some mild constipation, he will take stool softener if needed. Denies any headache or visual changes. He still has some areas of skin hypopigmentation on his skin. He recently had a restaging CT scan.  He is here today for evaluation and to review his scan before his next cycle of treatment with cycle #11.  MEDICAL HISTORY: Past Medical History:  Diagnosis Date   Arthritis    Hypertension    Hyperthyroidism    lung ca 11/2020    ALLERGIES:  has No Known Allergies.  MEDICATIONS:  Current Outpatient Medications  Medication Sig Dispense Refill   gabapentin (NEURONTIN) 250 MG/5ML solution Take 3 mLs (150 mg total) by mouth at bedtime. 90 mL 2   labetalol (NORMODYNE) 200 MG tablet Take 200 mg by mouth 2 (two) times daily.     levothyroxine (SYNTHROID) 150 MCG tablet Take 150 mcg by mouth daily before breakfast.     loperamide (IMODIUM) 2 MG capsule Take by mouth as needed for diarrhea or loose stools.     naproxen (NAPROSYN) 500 MG tablet Take 500 mg by mouth 2 (two) times daily as needed for moderate pain.     Olmesartan-Amlodipine-HCTZ 40-10-12.5 MG TABS Take 1 tablet by mouth daily.     oxyCODONE-acetaminophen (PERCOCET/ROXICET) 5-325 MG tablet Take 1 tablet by mouth every 8 (eight) hours as needed for severe pain. 30 tablet 0   prochlorperazine (COMPAZINE) 10 MG tablet Take 1 tablet (10 mg total) by mouth every 6 (six) hours as needed for nausea or vomiting. 30 tablet 0  tadalafil (CIALIS) 5 MG tablet Take 5 mg by mouth daily.     tamsulosin (FLOMAX) 0.4 MG CAPS capsule Take 0.4 mg by mouth daily.     No current facility-administered medications for this visit.    SURGICAL HISTORY:  Past Surgical History:  Procedure Laterality Date   COLONOSCOPY N/A 09/02/2016   Procedure: COLONOSCOPY;  Surgeon: Rogene Houston, MD;  Location: [facility];  Service:  Endoscopy;  Laterality: N/A;  830   NO PAST SURGERIES     POLYPECTOMY  09/02/2016   Procedure: POLYPECTOMY;  Surgeon: Rogene Houston, MD;  Location: [facility] SUITE;  Service: Endoscopy;;    REVIEW OF SYSTEMS:   Constitutional: Positive for stable fatigue. Negative for chills, decrease appetite, weight loss, and fever. HENT:  Negative for mouth sores, nosebleeds, or sore throat. Eyes: Negative for eye problems and icterus.  Respiratory: Positive for stable shortness of breath with exertion. Positive for mild cough. Negative for hemoptysis and wheezing.  Cardiovascular: Negative for chest pain and leg swelling.  Gastrointestinal: Negative for abdominal pain, constipation, diarrhea, nausea and vomiting.  Genitourinary: Negative for bladder incontinence, difficulty urinating, dysuria, frequency and hematuria.   Musculoskeletal: Positive for chronic right knee pain. Negative for back pain, gait problem, neck pain and neck stiffness.  Skin: Positive for hypopigmentation on face.  Neurological: Negative for dizziness, extremity weakness, gait problem, headaches, light-headedness and seizures.  Hematological: Negative for adenopathy. Does not bruise/bleed easily.  Psychiatric/Behavioral: Negative for confusion, depression and sleep disturbance. The patient is not nervous/anxious.     PHYSICAL EXAMINATION:  Blood pressure (!) 142/74, pulse 71, temperature 97.9 F (36.6 C), temperature source Axillary, resp. rate 17, height _0  (1.88 m), weight 156 lb 14.4 oz (71.2 kg), SpO2 96 %.  ECOG PERFORMANCE STATUS: 2  Physical Exam  Constitutional: Oriented to person, place, and time and thin appearing male and in no distress.  HENT:  Head: Normocephalic and atraumatic.  Mouth/Throat: Oropharynx is clear and moist. No oropharyngeal exudate.  No evidence of thrush Eyes: Conjunctivae are normal. Right eye exhibits no discharge. Left eye exhibits no discharge. No scleral icterus.  Neck: Normal range of  motion. Neck supple.  Cardiovascular: Normal rate, regular rhythm, normal heart sounds and intact distal pulses.   Pulmonary/Chest: Effort normal and breath sounds normal. No respiratory distress. No wheezes. No rales.  Abdominal: Soft. Bowel sounds are normal. Exhibits no distension and no mass. There is no tenderness.  Musculoskeletal: Normal range of motion. Exhibits no edema.  Lymphadenopathy:    No cervical adenopathy.  Neurological: Alert and oriented to person, place, and time. Exhibits muscle wasting.  The patient was examined in the wheelchair. Skin: Hypopigmentation on his face. Skin is warm and dry. No rash noted. Not diaphoretic. No erythema. No pallor.  Psychiatric: Mood, memory and judgment normal.  Vitals reviewed.    LABORATORY DATA: Lab Results  Component Value Date   WBC 3.2 (L) 10/15/2021   HGB 11.2 (L) 10/15/2021   HCT 33.8 (L) 10/15/2021   MCV 93.6 10/15/2021   PLT 150 10/15/2021      Chemistry      Component Value Date/Time   NA 137 10/15/2021 1405   K 4.3 10/15/2021 1405   CL 107 10/15/2021 1405   CO2 24 10/15/2021 1405   BUN 51 (H) 10/15/2021 1405   CREATININE 2.52 (H) 10/15/2021 1405      Component Value Date/Time   CALCIUM 8.8 (L) 10/15/2021 1405   ALKPHOS 78 10/15/2021 1405  AST 19 10/15/2021 1405   ALT 28 10/15/2021 1405   BILITOT 0.5 10/15/2021 1405       RADIOGRAPHIC STUDIES:  CT Abdomen Pelvis Wo Contrast  Result Date: 10/13/2021 CLINICAL DATA:  Patient with history of lung cancer. Follow-up exam. EXAM: CT CHEST, ABDOMEN AND PELVIS WITHOUT CONTRAST TECHNIQUE: Multidetector CT imaging of the chest, abdomen and pelvis was performed following the standard protocol without IV contrast. RADIATION DOSE REDUCTION: This exam was performed according to the departmental dose-optimization program which includes automated exposure control, adjustment of the mA and/or kV according to patient size and/or use of iterative reconstruction technique.  COMPARISON:  CT C [facility] 07/31/2021 FINDINGS: CT CHEST FINDINGS Cardiovascular: Heart is mildly enlarged. Trace fluid superior pericardial recess. Thoracic aortic vascular calcifications. Mediastinum/Nodes: Redemonstrated thyroid goiter. No enlarged axillary, mediastinal or hilar lymphadenopathy. Stable 9 mm left lower paratracheal lymph node (image 24; series 2). Lungs/Pleura: Small amount of dependent mucus within the left mainstem bronchus. Redemonstrated extensive centrilobular and paraseptal emphysematous changes. Similar-appearing large areas of coarse consolidation within the right upper lobe. Similar-appearing small loculated right pleural effusion. No pneumothorax. Slight interval decrease in size of 5.3 x 2.6 cm right chest wall mass previously 6.0 x 2.5 cm. Redemonstrated destruction of the adjacent third and fourth ribs. Musculoskeletal: Redemonstrated osseous destruction of the right third and fourth ribs. CT ABDOMEN PELVIS FINDINGS Hepatobiliary: Liver is normal in size and contour. Gallbladder is unremarkable. Pancreas: Unremarkable Spleen: Similar-appearing low-attenuation lesion within the spleen measuring approximally 1.9 cm (image 53; series 2). Adrenals/Urinary Tract: Normal adrenal glands. Multiple low-attenuation lesions within the kidneys bilaterally, unchanged. Mild wall thickening of the urinary bladder. Stomach/Bowel: Oral contrast material within the small bowel and proximal colon. No evidence for bowel obstruction. No free fluid or free intraperitoneal air. Vascular/Lymphatic: Normal caliber abdominal aorta. Peripheral calcified atherosclerotic plaque. No retroperitoneal lymphadenopathy. Reproductive: Heterogeneous prostate. Other: None. Musculoskeletal: Lumbar spine degenerative changes. No aggressive or acute appearing osseous lesions. IMPRESSION: 1. Similar to mild interval decrease in size right chest wall mass with associated rib destruction. 2. Similar-appearing fibrotic and  consolidative changes within the right upper lobe. 3. Similar appearing loculated right pleural effusion. 4. Thyroid goiter, similar to prior. 5. No evidence for metastasis within the abdomen or pelvis on noncontrast exam. 6. Unchanged low-density within the spleen. 7. Aortic atherosclerosis. 8. Emphysematous change. Electronically Signed   By: Lovey Newcomer M.D.   On: 10/13/2021 11:12   CT Chest Wo Contrast  Result Date: 10/13/2021 CLINICAL DATA:  Patient with history of lung cancer. Follow-up exam. EXAM: CT CHEST, ABDOMEN AND PELVIS WITHOUT CONTRAST TECHNIQUE: Multidetector CT imaging of the chest, abdomen and pelvis was performed following the standard protocol without IV contrast. RADIATION DOSE REDUCTION: This exam was performed according to the departmental dose-optimization program which includes automated exposure control, adjustment of the mA and/or kV according to patient size and/or use of iterative reconstruction technique. COMPARISON:  CT C [facility] 07/31/2021 FINDINGS: CT CHEST FINDINGS Cardiovascular: Heart is mildly enlarged. Trace fluid superior pericardial recess. Thoracic aortic vascular calcifications. Mediastinum/Nodes: Redemonstrated thyroid goiter. No enlarged axillary, mediastinal or hilar lymphadenopathy. Stable 9 mm left lower paratracheal lymph node (image 24; series 2). Lungs/Pleura: Small amount of dependent mucus within the left mainstem bronchus. Redemonstrated extensive centrilobular and paraseptal emphysematous changes. Similar-appearing large areas of coarse consolidation within the right upper lobe. Similar-appearing small loculated right pleural effusion. No pneumothorax. Slight interval decrease in size of 5.3 x 2.6 cm right chest wall mass previously 6.0 x 2.5 cm.  Redemonstrated destruction of the adjacent third and fourth ribs. Musculoskeletal: Redemonstrated osseous destruction of the right third and fourth ribs. CT ABDOMEN PELVIS FINDINGS Hepatobiliary: Liver is normal in size  and contour. Gallbladder is unremarkable. Pancreas: Unremarkable Spleen: Similar-appearing low-attenuation lesion within the spleen measuring approximally 1.9 cm (image 53; series 2). Adrenals/Urinary Tract: Normal adrenal glands. Multiple low-attenuation lesions within the kidneys bilaterally, unchanged. Mild wall thickening of the urinary bladder. Stomach/Bowel: Oral contrast material within the small bowel and proximal colon. No evidence for bowel obstruction. No free fluid or free intraperitoneal air. Vascular/Lymphatic: Normal caliber abdominal aorta. Peripheral calcified atherosclerotic plaque. No retroperitoneal lymphadenopathy. Reproductive: Heterogeneous prostate. Other: None. Musculoskeletal: Lumbar spine degenerative changes. No aggressive or acute appearing osseous lesions. IMPRESSION: 1. Similar to mild interval decrease in size right chest wall mass with associated rib destruction. 2. Similar-appearing fibrotic and consolidative changes within the right upper lobe. 3. Similar appearing loculated right pleural effusion. 4. Thyroid goiter, similar to prior. 5. No evidence for metastasis within the abdomen or pelvis on noncontrast exam. 6. Unchanged low-density within the spleen. 7. Aortic atherosclerosis. 8. Emphysematous change. Electronically Signed   By: Lovey Newcomer M.D.   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   The patient is status post a course of concurrent chemoradiation with carboplatin for an AUC of 2 and paclitaxel 45 mg per metered square.  He is status post 6 cycles.    The patient had  evidence of disease progression following concurrent chemoradiation.  The patient is currently undergoing single agent immunotherapy with Libtayo IV every 3 weeks due to his PD-L1 expression of 90%.  The patient is status post 10 cycles.   Labs were reviewed.  He has baseline CKD with creatinine of 2.52. Encouraged the patient to hydrate. Recommend he proceed with cycle #11 as scheduled.    The patient recently had a restaging CT scan performed.  The patient was seen with Dr. Julien Nordmann today.  Dr. Julien Nordmann personally and independently reviewed the scan discussed results with the patient today.  The scan showed no evidence of disease progression.  We will proceed with cycle #11 today as scheduled.  We will see him back for follow-up visit in 3 weeks for evaluation before starting cycle #12.  Advised him to take tylenol for his knee. If ever interested, he can follow up with an orthopedic provider for consideration of joint steroid injection. The patient is not interested in this at this time.   The patient was advised to call immediately if he has any concerning symptoms in the interval. The patient voices understanding of current disease status and treatment options and is in agreement with the current care plan. All questions were answered. The patient knows to call the clinic with any problems, questions or concerns. We can certainly see the patient much sooner if necessary  No orders of the defined types were placed in this encounter.    Derry Arbogast L Elijah Michaelis, PA-C 10/15/21'  ADDENDUM: Hematology/Oncology Attending: I had a face-to-face encounter with the patient today.  I reviewed his record, lab, scan and recommended his care plan.  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  The patient initially started a  course of concurrent chemoradiation with weekly carboplatin and paclitaxel to the locally advanced disease and has a chest.  This was followed by treatment with single agent immunotherapy with Libtayo (Cempilimab) every 3 weeks.  He is status post 10 cycles.  The patient has been tolerating his treatment fairly well with no concerning adverse effects.  He denied having any fatigue or weakness.  He has no skin rash or diarrhea. He had repeat CT scan of the chest, abdomen pelvis performed recently.  I personally and independently reviewed the scans and discussed the results with the patient today. His scan showed no concerning findings for disease progression. I recommended for the patient to proceed with cycle #11 today as planned. He will come back for follow-up visit in 3 weeks for evaluation before starting cycle #12. The patient was advised to call immediately if he has any other concerning symptoms in the interval.  The total time spent in the appointment was 32 minutes. Disclaimer: This note was dictated with voice recognition software. Similar sounding words can inadvertently be transcribed and may be missed upon review. Eilleen Kempf, MD 10/15/21

## 2021-10-15 ENCOUNTER — Inpatient Hospital Stay: Payer: Medicare HMO

## 2021-10-15 ENCOUNTER — Other Ambulatory Visit: Payer: Self-pay

## 2021-10-15 ENCOUNTER — Encounter: Payer: Self-pay | Admitting: Physician Assistant

## 2021-10-15 ENCOUNTER — Inpatient Hospital Stay: Payer: Medicare HMO | Attending: Internal Medicine

## 2021-10-15 ENCOUNTER — Inpatient Hospital Stay: Payer: Medicare HMO | Admitting: Physician Assistant

## 2021-10-15 VITALS — BP 142/74 | HR 71 | Temp 97.9°F | Resp 17 | Ht 74.0 in | Wt 156.9 lb

## 2021-10-15 DIAGNOSIS — Z79899 Other long term (current) drug therapy: Secondary | ICD-10-CM | POA: Diagnosis not present

## 2021-10-15 DIAGNOSIS — Z5112 Encounter for antineoplastic immunotherapy: Secondary | ICD-10-CM | POA: Insufficient documentation

## 2021-10-15 DIAGNOSIS — C778 Secondary and unspecified malignant neoplasm of lymph nodes of multiple regions: Secondary | ICD-10-CM | POA: Diagnosis not present

## 2021-10-15 DIAGNOSIS — C3411 Malignant neoplasm of upper lobe, right bronchus or lung: Secondary | ICD-10-CM

## 2021-10-15 LAB — CBC WITH DIFFERENTIAL (CANCER CENTER ONLY)
Abs Immature Granulocytes: 0.01 10*3/uL (ref 0.00–0.07)
Basophils Absolute: 0 10*3/uL (ref 0.0–0.1)
Basophils Relative: 0 %
Eosinophils Absolute: 0.1 10*3/uL (ref 0.0–0.5)
Eosinophils Relative: 2 %
HCT: 33.8 % — ABNORMAL LOW (ref 39.0–52.0)
Hemoglobin: 11.2 g/dL — ABNORMAL LOW (ref 13.0–17.0)
Immature Granulocytes: 0 %
Lymphocytes Relative: 21 %
Lymphs Abs: 0.7 10*3/uL (ref 0.7–4.0)
MCH: 31 pg (ref 26.0–34.0)
MCHC: 33.1 g/dL (ref 30.0–36.0)
MCV: 93.6 fL (ref 80.0–100.0)
Monocytes Absolute: 0.3 10*3/uL (ref 0.1–1.0)
Monocytes Relative: 10 %
Neutro Abs: 2.1 10*3/uL (ref 1.7–7.7)
Neutrophils Relative %: 67 %
Platelet Count: 150 10*3/uL (ref 150–400)
RBC: 3.61 MIL/uL — ABNORMAL LOW (ref 4.22–5.81)
RDW: 14.4 % (ref 11.5–15.5)
WBC Count: 3.2 10*3/uL — ABNORMAL LOW (ref 4.0–10.5)
nRBC: 0 % (ref 0.0–0.2)

## 2021-10-15 LAB — CMP (CANCER CENTER ONLY)
ALT: 28 U/L (ref 0–44)
AST: 19 U/L (ref 15–41)
Albumin: 3.3 g/dL — ABNORMAL LOW (ref 3.5–5.0)
Alkaline Phosphatase: 78 U/L (ref 38–126)
Anion gap: 6 (ref 5–15)
BUN: 51 mg/dL — ABNORMAL HIGH (ref 8–23)
CO2: 24 mmol/L (ref 22–32)
Calcium: 8.8 mg/dL — ABNORMAL LOW (ref 8.9–10.3)
Chloride: 107 mmol/L (ref 98–111)
Creatinine: 2.52 mg/dL — ABNORMAL HIGH (ref 0.61–1.24)
GFR, Estimated: 24 mL/min — ABNORMAL LOW (ref >60)
Glucose, Bld: 97 mg/dL (ref 70–99)
Potassium: 4.3 mmol/L (ref 3.5–5.1)
Sodium: 137 mmol/L (ref 135–145)
Total Bilirubin: 0.5 mg/dL (ref 0.3–1.2)
Total Protein: 7.8 g/dL (ref 6.5–8.1)

## 2021-10-15 LAB — TSH: TSH: 7.347 u[IU]/mL — ABNORMAL HIGH (ref 0.320–4.118)

## 2021-10-15 MED ORDER — SODIUM CHLORIDE 0.9 % IV SOLN: Freq: Once | INTRAVENOUS | Status: AC

## 2021-10-15 MED ORDER — SODIUM CHLORIDE 0.9 % IV SOLN
350.0000 mg | Freq: Once | INTRAVENOUS | Status: AC
  Administered 2021-10-15: 350 mg via INTRAVENOUS
  Filled 2021-10-15: qty 7

## 2021-10-15 NOTE — Patient Instructions (Signed)
[facility]COLOG[facility]  Discharge Instructions: Thank you for choosing [facility] to provide your oncology and hematology care.   If you have a lab appointment with the [facility], please go directly to the [facility] and check in at the registration area.   Wear comfortable clothing and clothing appropriate for easy access to any Portacath or PICC line.   We strive to give you quality time with your provider. You may need to reschedule your appointment if you arrive late (15 or more minutes).  Arriving late affects you and other patients whose appointments are after yours.  Also, if you miss three or more appointments without notifying the office, you may be dismissed from the clinic at the providers discretion.      For prescription refill requests, have your pharmacy contact our office and allow 72 hours for refills to be completed.    Today you received the following chemotherapy and/or immunotherapy agents Libtayo      To help prevent nausea and vomiting after your treatment, we encourage you to take your nausea medication as directed.  BELOW ARE SYMPTOMS THAT SHOULD BE REPORTED IMMEDIATELY: *FEVER GREATER THAN 100.4 F (38 C) OR HIGHER *CHILLS OR SWEATING *NAUSEA AND VOMITING THAT IS NOT CONTROLLED WITH YOUR NAUSEA MEDICATION *UNUSUAL SHORTNESS OF BREATH *UNUSUAL BRUISING OR BLEEDING *URINARY PROBLEMS (pain or burning when urinating, or frequent urination) *BOWEL PROBLEMS (unusual diarrhea, constipation, pain near the anus) TENDERNESS IN MOUTH AND THROAT WITH OR WITHOUT PRESENCE OF ULCERS (sore throat, sores in mouth, or a toothache) UNUSUAL RASH, SWELLING OR PAIN  UNUSUAL VAGINAL DISCHARGE OR ITCHING   Items with * indicate a potential emergency and should be followed up as soon as possible or go to the Emergency Department if any problems should occur.  Please show the CHEMOTHERAPY ALERT CARD or IMMUNOTHERAPY ALERT CARD at check-in to the  Emergency Department and triage nurse.  Should you have questions after your visit or need to cancel or reschedule your appointment, please contact [facility]  Dept: [phone]  and follow the prompts.  Office hours are 8:00 a.m. to 4:30 p.m. Monday - Friday. Please note that voicemails left after 4:00 p.m. may not be returned until the following business day.  We are closed weekends and major holidays. You have access to a nurse at all times for urgent questions. Please call the main number to the clinic Dept: [phone] and follow the prompts.   For any non-urgent questions, you may also contact your provider using MyChart. We now offer e-Visits for anyone 86 and older to request care online for non-urgent symptoms. For details visit [email or web address].   Also download the MyChart app! Go to the app store, search "MyChart", open the app, select [facility], and log in with your MyChart username and password.  Due to Covid, a mask is required upon entering the hospital/clinic. If you do not have a mask, one will be given to you upon arrival. For doctor visits, patients may have 1 support person aged 86 or older with them. For treatment visits, patients cannot have anyone with them due to current Covid guidelines and our immunocompromised population.

## 2021-10-16 ENCOUNTER — Ambulatory Visit: Payer: Medicare HMO

## 2021-10-16 ENCOUNTER — Other Ambulatory Visit: Payer: Self-pay | Admitting: Physician Assistant

## 2021-10-16 ENCOUNTER — Other Ambulatory Visit: Payer: Medicare HMO

## 2021-10-16 ENCOUNTER — Ambulatory Visit: Payer: Medicare HMO | Admitting: Internal Medicine

## 2021-10-16 DIAGNOSIS — E039 Hypothyroidism, unspecified: Secondary | ICD-10-CM

## 2021-10-16 MED ORDER — LEVOTHYROXINE SODIUM 175 MCG PO TABS: 175.0000 ug | ORAL_TABLET | Freq: Every day | ORAL | 1 refills | Status: DC

## 2021-10-16 NOTE — Progress Notes (Signed)
Attempted to call the patient to confirm the dose of synthroid he is taking and if he is compliant. I spoke to his daughter who confirmed that he is taking 150 mcg and is compliant with this. I have adjusted his dose to 150 mcg per Dr. Julien Nordmann. His daughter is aware.

## 2021-10-27 DIAGNOSIS — M1990 Unspecified osteoarthritis, unspecified site: Secondary | ICD-10-CM | POA: Diagnosis not present

## 2021-10-27 DIAGNOSIS — N400 Enlarged prostate without lower urinary tract symptoms: Secondary | ICD-10-CM | POA: Diagnosis not present

## 2021-10-27 DIAGNOSIS — N184 Chronic kidney disease, stage 4 (severe): Secondary | ICD-10-CM | POA: Diagnosis not present

## 2021-10-27 DIAGNOSIS — C3411 Malignant neoplasm of upper lobe, right bronchus or lung: Secondary | ICD-10-CM | POA: Diagnosis not present

## 2021-10-27 DIAGNOSIS — N390 Urinary tract infection, site not specified: Secondary | ICD-10-CM | POA: Diagnosis not present

## 2021-10-27 DIAGNOSIS — I129 Hypertensive chronic kidney disease with stage 1 through stage 4 chronic kidney disease, or unspecified chronic kidney disease: Secondary | ICD-10-CM | POA: Diagnosis not present

## 2021-10-27 DIAGNOSIS — D631 Anemia in chronic kidney disease: Secondary | ICD-10-CM | POA: Diagnosis not present

## 2021-10-28 ENCOUNTER — Other Ambulatory Visit (HOSPITAL_COMMUNITY): Payer: Self-pay | Admitting: Nephrology

## 2021-10-28 ENCOUNTER — Other Ambulatory Visit: Payer: Self-pay | Admitting: Nephrology

## 2021-10-28 DIAGNOSIS — N184 Chronic kidney disease, stage 4 (severe): Secondary | ICD-10-CM

## 2021-11-03 ENCOUNTER — Other Ambulatory Visit: Payer: Self-pay | Admitting: Physician Assistant

## 2021-11-03 DIAGNOSIS — E039 Hypothyroidism, unspecified: Secondary | ICD-10-CM

## 2021-11-04 NOTE — Progress Notes (Signed)
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   Biomarker Findings Tumor Mutational Burden - 10 Muts/Mb Microsatellite status - MS-Stable Genomic Findings For a complete list of the genes assayed, please refer to the Appendix. KRAS G12C NFKBIA amplification NKX2-1 amplification RBM10 D661f*80 7 Disease relevant genes with no reportable alterations: ALK, BRAF, EGFR, ERBB2, MET, RET, ROS1   PDL1: 90%  PRIOR THERAPY: Weekly concurrent chemoradiation with carboplatin for an AUC of 2 and paclitaxel 45 mg/m.  First dose on 12/30/2020.  Status post 6 cycles  CURRENT THERAPY:  First-line treatment with immunotherapy with Libtayo (Cempilimab) 350 Mg IV every 3 weeks. First dose on 03/19/21. Status post 11 cycles.  INTERVAL HISTORY: HJAI STEIL88y.o. male returns to the clinic today for a follow-up visit accompanied by his son-in-law.  The patient is feeling fairly well today without any concern complaints. [place] looks like he is scheduled for a renal ultrasound next week on 11/10/21. He was told he has stage IV CKD.The patient is currently undergoing immunotherapy with Libtayo.  He is tolerating treatment well without any concerning adverse side effects. He denies any fever, chills, or night sweats.  He is followed by member the nutritionist team for his weight loss although he has been gaining weight and his last few appointments or staying stable.  He reports stable fatigue. He reports dyspnea on exertion which is stable. he denies cough. Denies any chest pain or hemoptysis. He denies  nausea, vomiting, constipation, or diarrhea. Denies any headache or visual changes. He still has some areas of skin hypopigmentation on his skin. He is here today for evaluation  before his next cycle of treatment with cycle #12.  MEDICAL HISTORY: Past Medical History:  Diagnosis Date   Arthritis    Hypertension    Hyperthyroidism    lung ca 11/2020    ALLERGIES:  has No Known Allergies.  MEDICATIONS:  Current Outpatient Medications  Medication Sig Dispense Refill   gabapentin (NEURONTIN) 250 MG/5ML solution Take 3 mLs (150 mg total) by mouth at bedtime. 90 mL 2   labetalol (NORMODYNE) 200 MG tablet Take 200 mg by mouth 2 (two) times daily.     levothyroxine (SYNTHROID) 175 MCG tablet TAKE 1 TABLET BY MOUTH DAILY BEFORE BREAKFAST. 30 tablet 0   loperamide (IMODIUM) 2 MG capsule Take by mouth as needed for diarrhea or loose stools.     naproxen (NAPROSYN) 500 MG tablet Take 500 mg by mouth 2 (two) times daily as needed for moderate pain.     Olmesartan-Amlodipine-HCTZ 40-10-12.5 MG TABS Take 1 tablet by mouth daily.     oxyCODONE-acetaminophen (PERCOCET/ROXICET) 5-325 MG tablet Take 1 tablet by mouth every 8 (eight) hours as needed for severe pain. 30 tablet 0   prochlorperazine (COMPAZINE) 10 MG tablet Take 1 tablet (10 mg total) by mouth every 6 (six) hours as needed for nausea or vomiting. 30 tablet 0   tadalafil (CIALIS) 5 MG tablet Take 5 mg by mouth daily.     tamsulosin (FLOMAX) 0.4 MG CAPS capsule Take 0.4 mg by mouth daily.     No current facility-administered  medications for this visit.    SURGICAL HISTORY:  Past Surgical History:  Procedure Laterality Date   COLONOSCOPY N/A 09/02/2016   Procedure: COLONOSCOPY;  Surgeon: Rogene Houston, MD;  Location: [facility];  Service: Endoscopy;  Laterality: N/A;  830   NO PAST SURGERIES     POLYPECTOMY  09/02/2016   Procedure: POLYPECTOMY;  Surgeon: Rogene Houston, MD;  Location: [facility];  Service: Endoscopy;;    REVIEW  OF SYSTEMS:   Constitutional: Positive for stable fatigue. Negative for chills, decrease appetite, weight loss, and fever. HENT:  Negative for mouth sores, nosebleeds, or sore throat. Eyes: Negative for eye problems and icterus.  Respiratory: Positive for stable shortness of breath with exertion. Negative for hemoptysis, cough, and wheezing.  Cardiovascular: Negative for chest pain and leg swelling.  Gastrointestinal: Negative for abdominal pain, constipation, diarrhea, nausea and vomiting.  Genitourinary: Negative for bladder incontinence, difficulty urinating, dysuria, frequency and hematuria.   Musculoskeletal: Negative for back pain, gait problem, neck pain and neck stiffness.  Skin: Positive for hypopigmentation on face.  Neurological: Negative for dizziness, extremity weakness, gait problem, headaches, light-headedness and seizures.  Hematological: Negative for adenopathy. Does not bruise/bleed easily.  Psychiatric/Behavioral: Negative for confusion, depression and sleep disturbance. The patient is not nervous/anxious.     PHYSICAL EXAMINATION:  There were no vitals taken for this visit.  ECOG PERFORMANCE STATUS: 2  Physical Exam  Constitutional: Oriented to person, place, and time and thin appearing male and in no distress.  HENT:  Head: Normocephalic and atraumatic.  Mouth/Throat: Oropharynx is clear and moist. No oropharyngeal exudate.  No evidence of thrush Eyes: Conjunctivae are normal. Right eye exhibits no discharge. Left eye exhibits no discharge. No scleral icterus.  Neck: Normal range of motion. Neck supple.  Cardiovascular: Normal rate, regular rhythm, normal heart sounds and intact distal pulses.   Pulmonary/Chest: Effort normal and breath sounds normal. No respiratory distress. No wheezes. No rales.  Abdominal: Soft. Bowel sounds are normal. Exhibits no distension and no mass. There is no tenderness.  Musculoskeletal: Normal range of motion. Exhibits no edema.   Lymphadenopathy:    No cervical adenopathy.  Neurological: Alert and oriented to person, place, and time. Exhibits muscle wasting.  The patient was examined in the wheelchair. Skin: Hypopigmentation on his face. Skin is warm and dry. No rash noted. Not diaphoretic. No erythema. No pallor.  Psychiatric: Mood, memory and judgment normal.  Vitals reviewed.  LABORATORY DATA: Lab Results  Component Value Date   WBC 3.2 (L) 10/15/2021   HGB 11.2 (L) 10/15/2021   HCT 33.8 (L) 10/15/2021   MCV 93.6 10/15/2021   PLT 150 10/15/2021      Chemistry      Component Value Date/Time   NA 137 10/15/2021 1405   K 4.3 10/15/2021 1405   CL 107 10/15/2021 1405   CO2 24 10/15/2021 1405   BUN 51 (H) 10/15/2021 1405   CREATININE 2.52 (H) 10/15/2021 1405      Component Value Date/Time   CALCIUM 8.8 (L) 10/15/2021 1405   ALKPHOS 78 10/15/2021 1405   AST 19 10/15/2021 1405   ALT 28 10/15/2021 1405   BILITOT 0.5 10/15/2021 1405       RADIOGRAPHIC STUDIES:  CT Abdomen Pelvis Wo Contrast  Result Date: 10/13/2021 CLINICAL DATA:  Patient with history of lung cancer. Follow-up exam. EXAM: CT CHEST, ABDOMEN AND PELVIS WITHOUT CONTRAST TECHNIQUE: Multidetector CT imaging of the chest, abdomen and pelvis was performed following the standard  protocol without IV contrast. RADIATION DOSE REDUCTION: This exam was performed according to the departmental dose-optimization program which includes automated exposure control, adjustment of the mA and/or kV according to patient size and/or use of iterative reconstruction technique. COMPARISON:  CT C [facility] 07/31/2021 FINDINGS: CT CHEST FINDINGS Cardiovascular: Heart is mildly enlarged. Trace fluid superior pericardial recess. Thoracic aortic vascular calcifications. Mediastinum/Nodes: Redemonstrated thyroid goiter. No enlarged axillary, mediastinal or hilar lymphadenopathy. Stable 9 mm left lower paratracheal lymph node (image 24; series 2). Lungs/Pleura: Small amount of  dependent mucus within the left mainstem bronchus. Redemonstrated extensive centrilobular and paraseptal emphysematous changes. Similar-appearing large areas of coarse consolidation within the right upper lobe. Similar-appearing small loculated right pleural effusion. No pneumothorax. Slight interval decrease in size of 5.3 x 2.6 cm right chest wall mass previously 6.0 x 2.5 cm. Redemonstrated destruction of the adjacent third and fourth ribs. Musculoskeletal: Redemonstrated osseous destruction of the right third and fourth ribs. CT ABDOMEN PELVIS FINDINGS Hepatobiliary: Liver is normal in size and contour. Gallbladder is unremarkable. Pancreas: Unremarkable Spleen: Similar-appearing low-attenuation lesion within the spleen measuring approximally 1.9 cm (image 53; series 2). Adrenals/Urinary Tract: Normal adrenal glands. Multiple low-attenuation lesions within the kidneys bilaterally, unchanged. Mild wall thickening of the urinary bladder. Stomach/Bowel: Oral contrast material within the small bowel and proximal colon. No evidence for bowel obstruction. No free fluid or free intraperitoneal air. Vascular/Lymphatic: Normal caliber abdominal aorta. Peripheral calcified atherosclerotic plaque. No retroperitoneal lymphadenopathy. Reproductive: Heterogeneous prostate. Other: None. Musculoskeletal: Lumbar spine degenerative changes. No aggressive or acute appearing osseous lesions. IMPRESSION: 1. Similar to mild interval decrease in size right chest wall mass with associated rib destruction. 2. Similar-appearing fibrotic and consolidative changes within the right upper lobe. 3. Similar appearing loculated right pleural effusion. 4. Thyroid goiter, similar to prior. 5. No evidence for metastasis within the abdomen or pelvis on noncontrast exam. 6. Unchanged low-density within the spleen. 7. Aortic atherosclerosis. 8. Emphysematous change. Electronically Signed   By: Lovey Newcomer M.D.   On: 10/13/2021 11:12   CT Chest Wo  Contrast  Result Date: 10/13/2021 CLINICAL DATA:  Patient with history of lung cancer. Follow-up exam. EXAM: CT CHEST, ABDOMEN AND PELVIS WITHOUT CONTRAST TECHNIQUE: Multidetector CT imaging of the chest, abdomen and pelvis was performed following the standard protocol without IV contrast. RADIATION DOSE REDUCTION: This exam was performed according to the departmental dose-optimization program which includes automated exposure control, adjustment of the mA and/or kV according to patient size and/or use of iterative reconstruction technique. COMPARISON:  CT C [facility] 07/31/2021 FINDINGS: CT CHEST FINDINGS Cardiovascular: Heart is mildly enlarged. Trace fluid superior pericardial recess. Thoracic aortic vascular calcifications. Mediastinum/Nodes: Redemonstrated thyroid goiter. No enlarged axillary, mediastinal or hilar lymphadenopathy. Stable 9 mm left lower paratracheal lymph node (image 24; series 2). Lungs/Pleura: Small amount of dependent mucus within the left mainstem bronchus. Redemonstrated extensive centrilobular and paraseptal emphysematous changes. Similar-appearing large areas of coarse consolidation within the right upper lobe. Similar-appearing small loculated right pleural effusion. No pneumothorax. Slight interval decrease in size of 5.3 x 2.6 cm right chest wall mass previously 6.0 x 2.5 cm. Redemonstrated destruction of the adjacent third and fourth ribs. Musculoskeletal: Redemonstrated osseous destruction of the right third and fourth ribs. CT ABDOMEN PELVIS FINDINGS Hepatobiliary: Liver is normal in size and contour. Gallbladder is unremarkable. Pancreas: Unremarkable Spleen: Similar-appearing low-attenuation lesion within the spleen measuring approximally 1.9 cm (image 53; series 2). Adrenals/Urinary Tract: Normal adrenal glands. Multiple low-attenuation lesions within the kidneys bilaterally, unchanged. Mild wall thickening  of the urinary bladder. Stomach/Bowel: Oral contrast material within the  small bowel and proximal colon. No evidence for bowel obstruction. No free fluid or free intraperitoneal air. Vascular/Lymphatic: Normal caliber abdominal aorta. Peripheral calcified atherosclerotic plaque. No retroperitoneal lymphadenopathy. Reproductive: Heterogeneous prostate. Other: None. Musculoskeletal: Lumbar spine degenerative changes. No aggressive or acute appearing osseous lesions. IMPRESSION: 1. Similar to mild interval decrease in size right chest wall mass with associated rib destruction. 2. Similar-appearing fibrotic and consolidative changes within the right upper lobe. 3. Similar appearing loculated right pleural effusion. 4. Thyroid goiter, similar to prior. 5. No evidence for metastasis within the abdomen or pelvis on noncontrast exam. 6. Unchanged low-density within the spleen. 7. Aortic atherosclerosis. 8. Emphysematous change. Electronically Signed   By: Lovey Newcomer M.D.   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   The patient is status post a course of concurrent chemoradiation with carboplatin for an AUC of 2 and paclitaxel 45 mg per metered square.  He is status post 6 cycles.   The patient had evidence of disease progression following concurrent chemoradiation.   The patient is currently undergoing single agent immunotherapy with Libtayo IV every 3 weeks due to his PD-L1 expression of 90%.  The patient is status post 11 cycles.   Labs were reviewed.  He has baseline CKD with creatinine of 2.41. He sees nephrology in [place], [place]. Recommend he proceed with cycle #12 as scheduled.   We  will see him back for follow-up visit in 3 weeks for evaluation before starting cycle #13.  The patient was advised to call immediately if he has any concerning symptoms in the interval. The patient voices understanding of current disease status and treatment options and is in agreement with the current care plan. All questions were answered. The patient knows to call the clinic with any problems, questions or concerns. We can certainly see the patient much sooner if necessary       No orders of the defined types were placed in this encounter.    The total time spent in the appointment was 20-29 minutes.   Breeanne Oblinger L Yochanan Eddleman, PA-C 11/04/21

## 2021-11-06 ENCOUNTER — Inpatient Hospital Stay: Payer: Medicare HMO

## 2021-11-06 ENCOUNTER — Inpatient Hospital Stay: Payer: Medicare HMO | Attending: Physician Assistant

## 2021-11-06 ENCOUNTER — Inpatient Hospital Stay (HOSPITAL_BASED_OUTPATIENT_CLINIC_OR_DEPARTMENT_OTHER): Payer: Medicare HMO | Admitting: Physician Assistant

## 2021-11-06 ENCOUNTER — Other Ambulatory Visit: Payer: Self-pay

## 2021-11-06 VITALS — BP 123/72 | HR 69 | Temp 96.8°F | Resp 17 | Ht 74.0 in | Wt 158.1 lb

## 2021-11-06 DIAGNOSIS — Z79899 Other long term (current) drug therapy: Secondary | ICD-10-CM | POA: Diagnosis not present

## 2021-11-06 DIAGNOSIS — C3411 Malignant neoplasm of upper lobe, right bronchus or lung: Secondary | ICD-10-CM | POA: Diagnosis not present

## 2021-11-06 DIAGNOSIS — Z5112 Encounter for antineoplastic immunotherapy: Secondary | ICD-10-CM

## 2021-11-06 LAB — CBC WITH DIFFERENTIAL (CANCER CENTER ONLY)
Abs Immature Granulocytes: 0.01 10*3/uL (ref 0.00–0.07)
Basophils Absolute: 0 10*3/uL (ref 0.0–0.1)
Basophils Relative: 0 %
Eosinophils Absolute: 0.1 10*3/uL (ref 0.0–0.5)
Eosinophils Relative: 3 %
HCT: 32.8 % — ABNORMAL LOW (ref 39.0–52.0)
Hemoglobin: 10.8 g/dL — ABNORMAL LOW (ref 13.0–17.0)
Immature Granulocytes: 0 %
Lymphocytes Relative: 26 %
Lymphs Abs: 0.7 10*3/uL (ref 0.7–4.0)
MCH: 30.9 pg (ref 26.0–34.0)
MCHC: 32.9 g/dL (ref 30.0–36.0)
MCV: 93.7 fL (ref 80.0–100.0)
Monocytes Absolute: 0.3 10*3/uL (ref 0.1–1.0)
Monocytes Relative: 12 %
Neutro Abs: 1.5 10*3/uL — ABNORMAL LOW (ref 1.7–7.7)
Neutrophils Relative %: 59 %
Platelet Count: 150 10*3/uL (ref 150–400)
RBC: 3.5 MIL/uL — ABNORMAL LOW (ref 4.22–5.81)
RDW: 14.6 % (ref 11.5–15.5)
WBC Count: 2.6 10*3/uL — ABNORMAL LOW (ref 4.0–10.5)
nRBC: 0 % (ref 0.0–0.2)

## 2021-11-06 LAB — CMP (CANCER CENTER ONLY)
ALT: 30 U/L (ref 0–44)
AST: 18 U/L (ref 15–41)
Albumin: 3.3 g/dL — ABNORMAL LOW (ref 3.5–5.0)
Alkaline Phosphatase: 92 U/L (ref 38–126)
Anion gap: 5 (ref 5–15)
BUN: 46 mg/dL — ABNORMAL HIGH (ref 8–23)
CO2: 26 mmol/L (ref 22–32)
Calcium: 8.8 mg/dL — ABNORMAL LOW (ref 8.9–10.3)
Chloride: 108 mmol/L (ref 98–111)
Creatinine: 2.41 mg/dL — ABNORMAL HIGH (ref 0.61–1.24)
GFR, Estimated: 26 mL/min — ABNORMAL LOW (ref >60)
Glucose, Bld: 75 mg/dL (ref 70–99)
Potassium: 4.3 mmol/L (ref 3.5–5.1)
Sodium: 139 mmol/L (ref 135–145)
Total Bilirubin: 0.3 mg/dL (ref 0.3–1.2)
Total Protein: 7.3 g/dL (ref 6.5–8.1)

## 2021-11-06 LAB — TSH: TSH: 6.893 u[IU]/mL — ABNORMAL HIGH (ref 0.320–4.118)

## 2021-11-06 MED ORDER — SODIUM CHLORIDE 0.9 % IV SOLN
350.0000 mg | Freq: Once | INTRAVENOUS | Status: AC
  Administered 2021-11-06: 350 mg via INTRAVENOUS
  Filled 2021-11-06: qty 7

## 2021-11-06 MED ORDER — SODIUM CHLORIDE 0.9 % IV SOLN: Freq: Once | INTRAVENOUS | Status: AC

## 2021-11-06 NOTE — Patient Instructions (Signed)
[facility]  Discharge Instructions: Thank you for choosing [facility] to provide your oncology and hematology care.   If you have a lab appointment with the [facility], please go directly to the [facility] and check in at the registration area.   Wear comfortable clothing and clothing appropriate for easy access to any Portacath or PICC line.   We strive to give you quality time with your provider. You may need to reschedule your appointment if you arrive late (15 or more minutes).  Arriving late affects you and other patients whose appointments are after yours.  Also, if you miss three or more appointments without notifying the office, you may be dismissed from the clinic at the providers discretion.      For prescription refill requests, have your pharmacy contact our office and allow 72 hours for refills to be completed.    Today you received the following chemotherapy and/or immunotherapy agents Libtayo      To help prevent nausea and vomiting after your treatment, we encourage you to take your nausea medication as directed.  BELOW ARE SYMPTOMS THAT SHOULD BE REPORTED IMMEDIATELY: *FEVER GREATER THAN 100.4 F (38 C) OR HIGHER *CHILLS OR SWEATING *NAUSEA AND VOMITING THAT IS NOT CONTROLLED WITH YOUR NAUSEA MEDICATION *UNUSUAL SHORTNESS OF BREATH *UNUSUAL BRUISING OR BLEEDING *URINARY PROBLEMS (pain or burning when urinating, or frequent urination) *BOWEL PROBLEMS (unusual diarrhea, constipation, pain near the anus) TENDERNESS IN MOUTH AND THROAT WITH OR WITHOUT PRESENCE OF ULCERS (sore throat, sores in mouth, or a toothache) UNUSUAL RASH, SWELLING OR PAIN  UNUSUAL VAGINAL DISCHARGE OR ITCHING   Items with * indicate a potential emergency and should be followed up as soon as possible or go to the Emergency Department if any problems should occur.  Please show the CHEMOTHERAPY ALERT CARD or IMMUNOTHERAPY ALERT CARD at check-in to the  Emergency Department and triage nurse.  Should you have questions after your visit or need to cancel or reschedule your appointment, please contact [facility]  Dept: [phone]  and follow the prompts.  Office hours are 8:00 a.m. to 4:30 p.m. Monday - Friday. Please note that voicemails left after 4:00 p.m. may not be returned until the following business day.  We are closed weekends and major holidays. You have access to a nurse at all times for urgent questions. Please call the main number to the clinic Dept: [phone] and follow the prompts.   For any non-urgent questions, you may also contact your provider using MyChart. We now offer e-Visits for anyone 59 and older to request care online for non-urgent symptoms. For details visit [email or web address].   Also download the MyChart app! Go to the app store, search "MyChart", open the app, select [facility], and log in with your MyChart username and password.  Due to Covid, a mask is required upon entering the hospital/clinic. If you do not have a mask, one will be given to you upon arrival. For doctor visits, patients may have 1 support person aged 31 or older with them. For treatment visits, patients cannot have anyone with them due to current Covid guidelines and our immunocompromised population.

## 2021-11-10 ENCOUNTER — Other Ambulatory Visit: Payer: Self-pay

## 2021-11-10 ENCOUNTER — Ambulatory Visit (HOSPITAL_COMMUNITY)
Admission: RE | Admit: 2021-11-10 | Discharge: 2021-11-10 | Disposition: A | Discharge status: Home / Self Care | Payer: Medicare HMO | Source: Ambulatory Visit | Attending: Nephrology | Admitting: Nephrology

## 2021-11-10 DIAGNOSIS — N184 Chronic kidney disease, stage 4 (severe): Secondary | ICD-10-CM | POA: Diagnosis not present

## 2021-11-10 DIAGNOSIS — N281 Cyst of kidney, acquired: Secondary | ICD-10-CM | POA: Diagnosis not present

## 2021-11-10 IMAGING — CT CT CHEST W/O CM
2 of 4 series · 15 of 36 positions shown, 18 images · non-contrast
Comparison: CT chest, 10/23/2020, PET-CT, 11/25/2020

CLINICAL DATA: Lung cancer restaging, chemo radiation

EXAM:
CT CHEST WITHOUT CONTRAST
TECHNIQUE: Multidetector CT imaging of the chest was performed following the
standard protocol without IV contrast.

[Series 2: routine chest without · axial · non-contrast · 0.62mm/px · z∈[+1236,+1508]mm · 12 of 162 slices shown, 15 images]
[im 13/162  mediastinal]
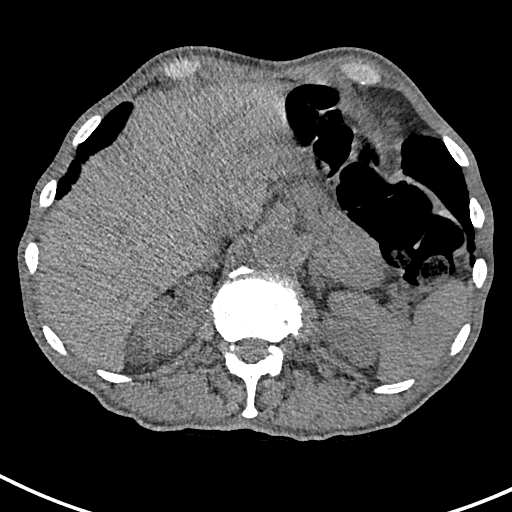
[im 13/162  lung]
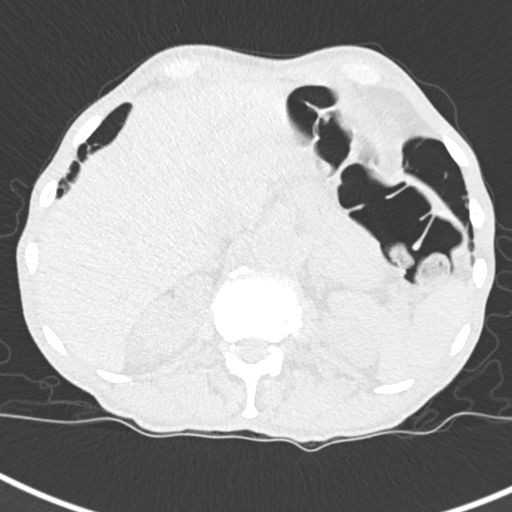
[im 25/162  lung]
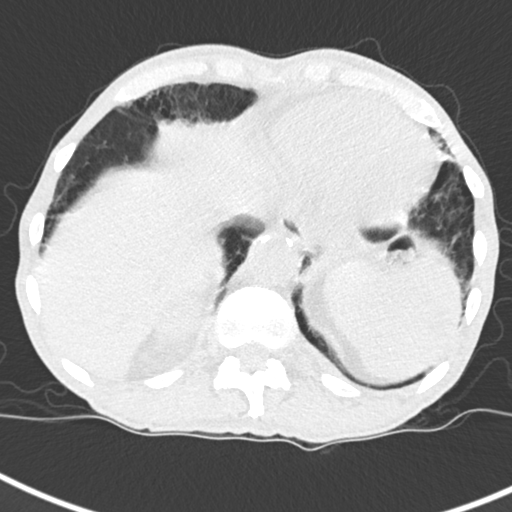
[im 38/162  lung]
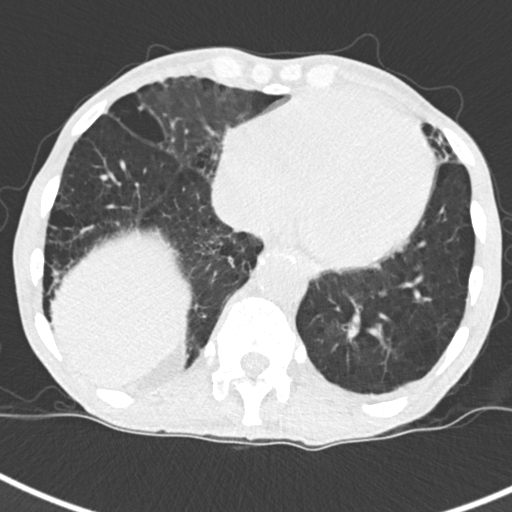
[im 50/162  lung]
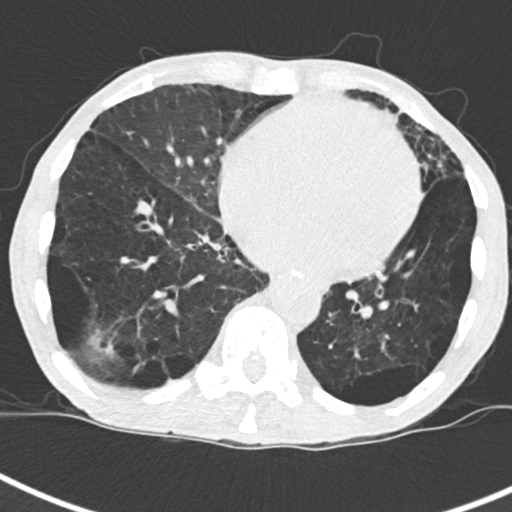
[im 62/162  mediastinal]
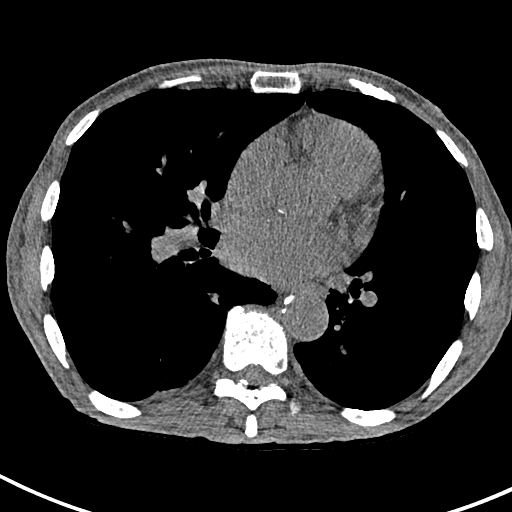
[im 62/162  lung]
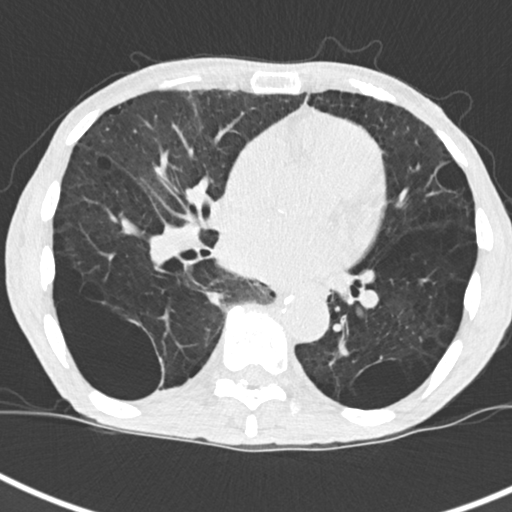
[im 75/162  lung]
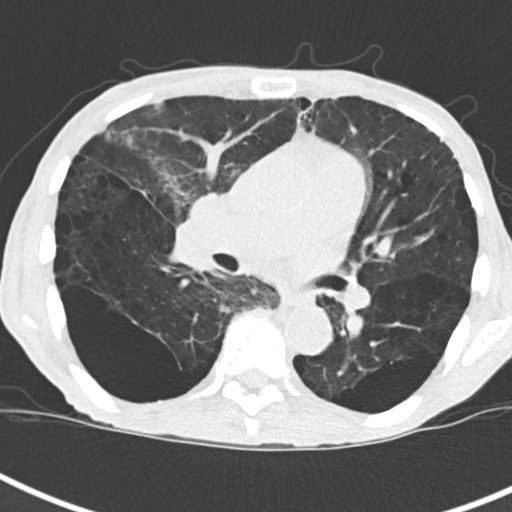
[im 87/162  lung]
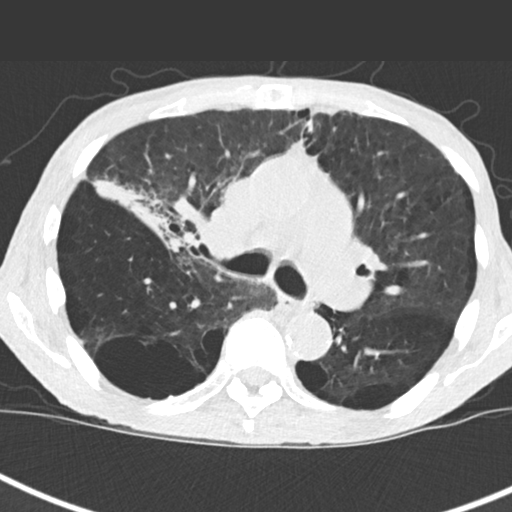
[im 100/162  lung]
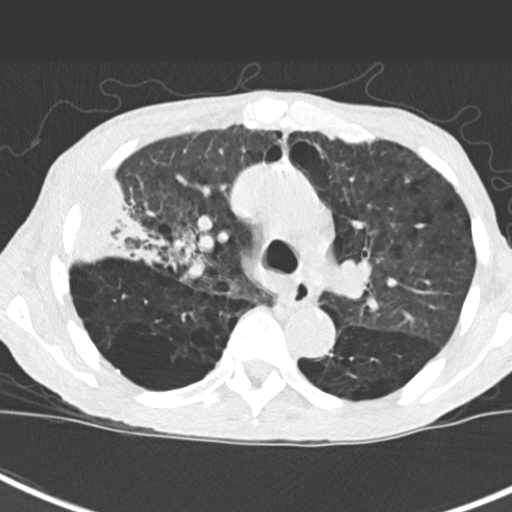
[im 112/162  mediastinal]
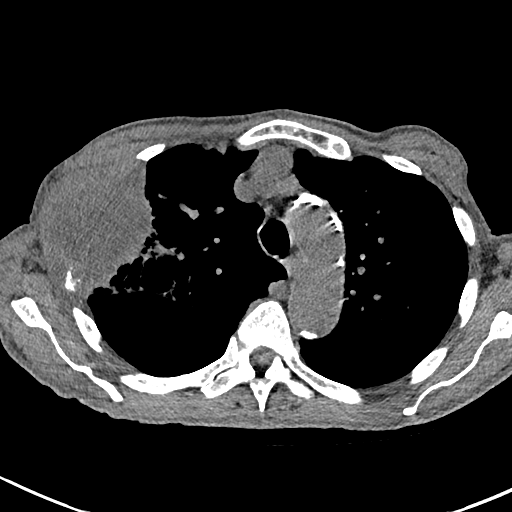
[im 112/162  lung]
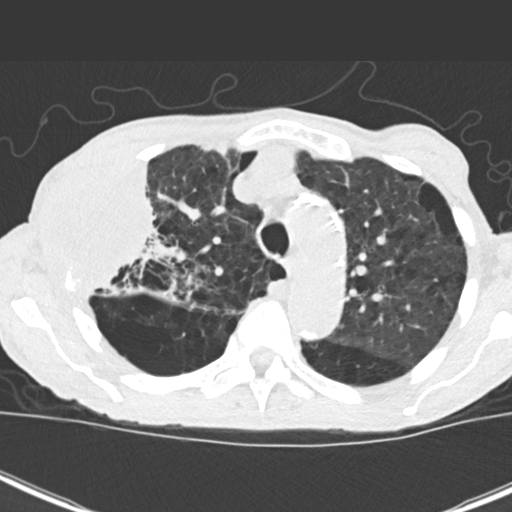
[im 124/162  lung]
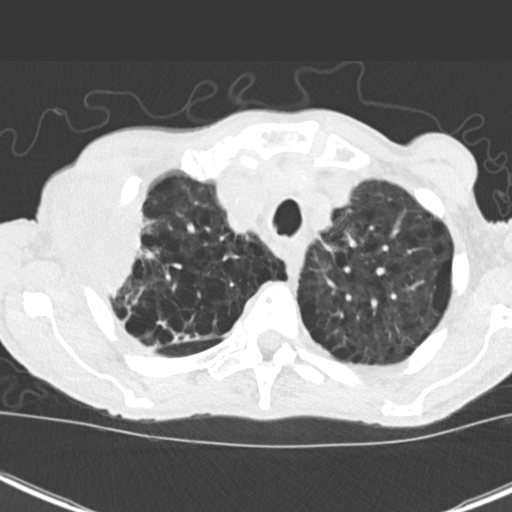
[im 137/162  lung]
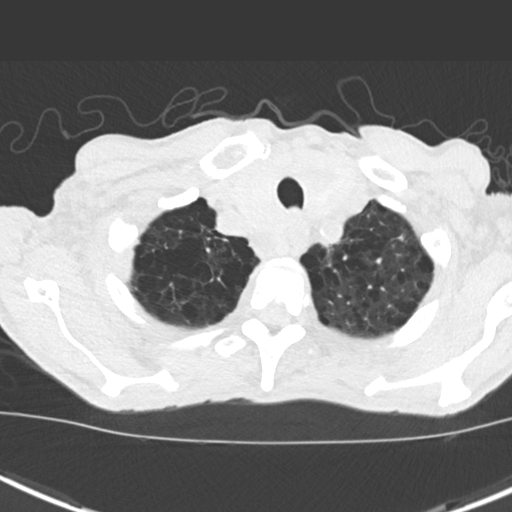
[im 149/162  lung]
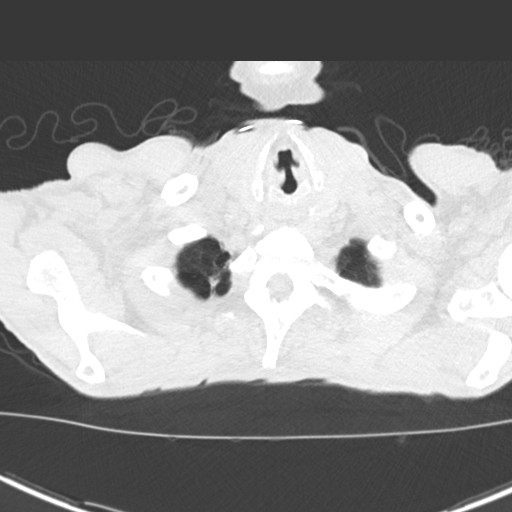

[Series 5: coronal · coronal · 0.63mm/px · 3 of 141 slices shown]
[im 29/141  lung]
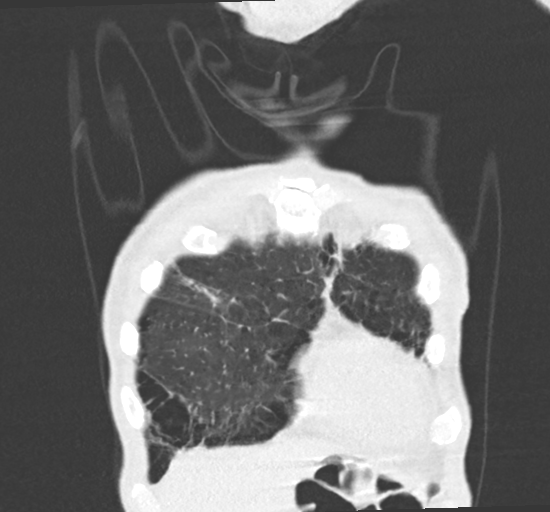
[im 57/141  lung]
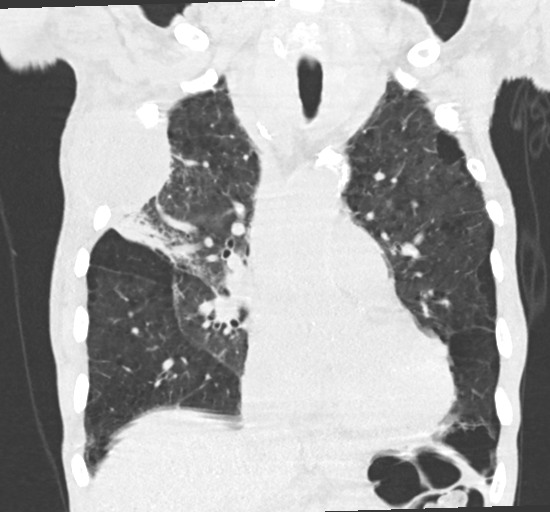
[im 85/141  lung]
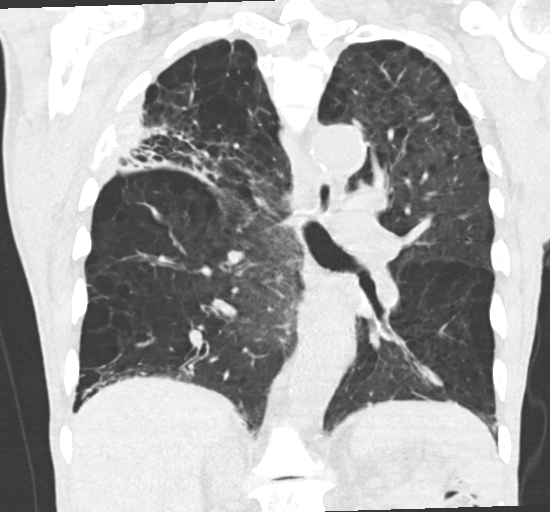

[15 of 36 positions shown; findings below may reference images not displayed]

FINDINGS: Cardiovascular: Aortic atherosclerosis. Normal heart size. Scattered
left and right coronary artery calcifications. No pericardial
effusion.

Mediastinum/Nodes: No enlarged mediastinal, hilar, or axillary lymph
nodes. Heterogeneous, multinodular thyroid. Trachea, and esophagus
demonstrate no significant findings.

Lungs/Pleura: Interval enlargement of a large mass centered at the
periphery of the right upper lobe, now measuring at least 8.4 x
cm, previously 7.5 x 5.5 cm when measured similarly (series 2, image
51). Severe centrilobular emphysema with large bullae at the lung
basis. No pleural effusion or pneumothorax.

Upper Abdomen: No acute abnormality.

Musculoskeletal: Bony destruction of the right third and fourth
ribs, substantially increased compared to prior examination,
corresponding to the enlarged mass.
IMPRESSION: 1. Interval enlargement of a large mass centered at the periphery of
the right upper lobe and extending into the adjacent chest wall, now
measuring at least 8.4 x 6.7 cm, previously 7.5 x 5.5 cm when
measured similarly. Bony destruction of the right third and fourth
ribs, substantially increased compared to prior examination,
corresponding to the enlarged mass. Findings are consistent with
worsened malignancy.
2. Severe emphysema.
3. Coronary artery disease.
4. Enlarged, multinodular thyroid. In the setting of significant
comorbidities or limited life expectancy, no follow-up recommended
(ref: [HOSPITAL]. [DATE]): 143-50).

Aortic Atherosclerosis (4AJR4-QUB.B) and Emphysema (4AJR4-40Q.Y).

## 2021-11-15 ENCOUNTER — Other Ambulatory Visit: Payer: Self-pay | Admitting: Physician Assistant

## 2021-11-15 DIAGNOSIS — E039 Hypothyroidism, unspecified: Secondary | ICD-10-CM

## 2021-11-20 ENCOUNTER — Ambulatory Visit: Payer: Medicare HMO | Admitting: Internal Medicine

## 2021-11-20 ENCOUNTER — Ambulatory Visit: Payer: Medicare HMO

## 2021-11-20 ENCOUNTER — Other Ambulatory Visit: Payer: Medicare HMO

## 2021-11-26 ENCOUNTER — Inpatient Hospital Stay: Payer: Medicare HMO

## 2021-11-26 ENCOUNTER — Telehealth: Payer: Self-pay | Admitting: Internal Medicine

## 2021-11-26 ENCOUNTER — Inpatient Hospital Stay: Payer: Medicare HMO | Admitting: Internal Medicine

## 2021-11-26 NOTE — Progress Notes (Signed)
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   Biomarker Findings Tumor Mutational Burden - 10 Muts/Mb Microsatellite status - MS-Stable Genomic Findings For a complete list of the genes assayed, please refer to the Appendix. KRAS G12C NFKBIA amplification NKX2-1 amplification RBM10 D666f*80 7 Disease relevant genes with no reportable alterations: ALK, BRAF, EGFR, ERBB2, MET, RET, ROS1   PDL1: 90%  PRIOR THERAPY: Weekly concurrent chemoradiation with carboplatin for an AUC of 2 and paclitaxel 45 mg/m.  First dose on 12/30/2020.  Status post 6 cycles  CURRENT THERAPY:   First-line treatment with immunotherapy with Libtayo (Cempilimab) 350 Mg IV every 3 weeks. First dose on 03/19/21. Status post 12 cycles.  INTERVAL HISTORY: Brian HYMAS88y.o. male returns to the clinic today for a follow-up visit accompanied by his son-in-law.  The patient is feeling fairly well today without any concern complaints except he has been having areas of hypopigmentation on his face for a few months but it has gotten worse recently.  He denies any history of any dermatologic conditions.  Denies any history of tinea.  It is mildly pruritic.  Denies any pain.  Denies any hypotension.  Denies any unusual weakness.  He was supposed to come in yesterday for an appointment but he had 1 day of nausea and vomiting x2 and 2 episodes of diarrhea.  Denies any blood in the stool.  When asked about abdominal pain he reports" not much".  He denies any unusual food.  He reports his bowel habits have returned to normal today and he feels at his baseline.  Denied  any other similar symptoms in the interval since his last appointment.   [place]The patient is currently undergoing immunotherapy with Libtayo.  He is tolerating treatment well without any concerning adverse side effects except for the possible skin changes. He denies any fever, chills, or night sweats.  He is followed by member the nutritionist team for his weight loss.  He lost 5 pounds since his last appointment.  He reports stable fatigue. He reports dyspnea on exertion which is stable. He denies cough. Denies any chest pain or hemoptysis. Denies any headache or visual changes. He is here today for evaluation  before his next cycle of treatment with cycle #13.    MEDICAL HISTORY: Past Medical History:  Diagnosis Date   Arthritis    Hypertension    Hyperthyroidism    lung ca 11/2020    ALLERGIES:  has No Known Allergies.  MEDICATIONS:  Current Outpatient Medications  Medication Sig Dispense Refill   gabapentin (NEURONTIN) 250 MG/5ML solution Take 3 mLs (150 mg total) by mouth at bedtime. 90 mL 2   labetalol (NORMODYNE) 200 MG tablet Take 200 mg by mouth 2 (two) times daily.     levothyroxine (SYNTHROID) 175 MCG tablet TAKE 1 TABLET BY MOUTH DAILY BEFORE BREAKFAST. 30 tablet 0   loperamide (IMODIUM) 2 MG capsule Take by mouth as needed for diarrhea or loose stools.     naproxen (NAPROSYN) 500 MG tablet Take 500 mg by mouth 2 (two) times daily as needed for moderate pain.  Olmesartan-Amlodipine-HCTZ 40-10-12.5 MG TABS Take 1 tablet by mouth daily.     oxyCODONE-acetaminophen (PERCOCET/ROXICET) 5-325 MG tablet Take 1 tablet by mouth every 8 (eight) hours as needed for severe pain. 30 tablet 0   tadalafil (CIALIS) 5 MG tablet Take 5 mg by mouth daily.     tamsulosin (FLOMAX) 0.4 MG CAPS capsule Take 0.4 mg by mouth daily.     prochlorperazine (COMPAZINE) 10 MG tablet Take 1 tablet (10 mg total) by mouth every 6 (six) hours as  needed for nausea or vomiting. 30 tablet 2   No current facility-administered medications for this visit.   Facility-Administered Medications Ordered in Other Visits  Medication Dose Route Frequency Provider Last Rate Last Admin   cemiplimab-rwlc (LIBTAYO) 350 mg in sodium chloride 0.9 % 100 mL chemo infusion  350 mg Intravenous Once Curt Bears, MD        SURGICAL HISTORY:  Past Surgical History:  Procedure Laterality Date   COLONOSCOPY N/A 09/02/2016   Procedure: COLONOSCOPY;  Surgeon: Rogene Houston, MD;  [facility];  Service: Endoscopy;  Laterality: N/A;  830   NO PAST SURGERIES     POLYPECTOMY  09/02/2016   Procedure: POLYPECTOMY;  Surgeon: Rogene Houston, MD;  Location: [facility];  Service: Endoscopy;;    REVIEW OF SYSTEMS:   Constitutional: Positive for stable fatigue.  Positive for weight loss.  Negative for chills, decrease appetite, and fever. HENT:  Negative for mouth sores, nosebleeds, or sore throat. Eyes: Negative for eye problems and icterus.  Respiratory: Positive for stable shortness of breath with exertion. Negative for hemoptysis, cough, and wheezing.  Cardiovascular: Negative for chest pain and leg swelling.  Gastrointestinal: Positive for nausea, vomiting, diarrhea yesterday.  Negative for abdominal pain and constipation. Genitourinary: Negative for bladder incontinence, difficulty urinating, dysuria, frequency and hematuria.   Musculoskeletal: Negative for back pain, gait problem, neck pain and neck stiffness.  Skin: Positive for hypopigmentation on face.  Neurological: Negative for dizziness, extremity weakness, gait problem, headaches, light-headedness and seizures.  Hematological: Negative for adenopathy. Does not bruise/bleed easily.  Psychiatric/Behavioral: Negative for confusion, depression and sleep disturbance. The patient is not nervous/anxious.      PHYSICAL EXAMINATION:  Blood pressure 129/69, pulse 61, weight 153 lb 3.2 oz  (69.5 kg), SpO2 97 %.  ECOG PERFORMANCE STATUS: 1-2  Physical Exam  Constitutional: Oriented to person, place, and time and thin appearing male and in no distress.  HENT:  Head: Normocephalic and atraumatic.  Mouth/Throat: Oropharynx is clear and moist. No oropharyngeal exudate.  No evidence of thrush Eyes: Conjunctivae are normal. Right eye exhibits no discharge. Left eye exhibits no discharge. No scleral icterus.  Neck: Normal range of motion. Neck supple.  Cardiovascular: Normal rate, regular rhythm, normal heart sounds and intact distal pulses.   Pulmonary/Chest: Effort normal and breath sounds normal. No respiratory distress. No wheezes. No rales.  Abdominal: Soft. Bowel sounds are normal. Exhibits no distension and no mass. There is no tenderness.  Musculoskeletal: Normal range of motion. Exhibits no edema.  Lymphadenopathy:    No cervical adenopathy.  Neurological: Alert and oriented to person, place, and time. Exhibits muscle wasting.  The patient was examined in the wheelchair. Skin: Hypopigmentation on his face and upper torso. Mild skin peeling. Skin is warm and dry. Not diaphoretic. No erythema. No pallor.  Psychiatric: Mood, memory and judgment normal.  Vitals reviewed.  LABORATORY DATA: Lab Results  Component Value Date   WBC 2.4 (L) 11/27/2021  HGB 10.8 (L) 11/27/2021   HCT 34.4 (L) 11/27/2021   MCV 95.0 11/27/2021   PLT 145 (L) 11/27/2021      Chemistry      Component Value Date/Time   NA 140 11/27/2021 1037   K 4.3 11/27/2021 1037   CL 109 11/27/2021 1037   CO2 26 11/27/2021 1037   BUN 48 (H) 11/27/2021 1037   CREATININE 2.57 (H) 11/27/2021 1037      Component Value Date/Time   CALCIUM 9.1 11/27/2021 1037   ALKPHOS 83 11/27/2021 1037   AST 34 11/27/2021 1037   ALT 59 (H) 11/27/2021 1037   BILITOT 0.5 11/27/2021 1037       RADIOGRAPHIC STUDIES:  US RENAL  Result Date: 2021-11-23 CLINICAL DATA:  Chronic kidney disease stage 4. EXAM: RENAL /  URINARY TRACT ULTRASOUND COMPLETE COMPARISON:  04/17/2014 FINDINGS: Right Kidney: Renal measurements: 8.0 x 3.7 x 4.0 cm = volume: 62 mL. Moderate increased parenchymal echogenicity. Three small cysts present with the largest measuring 2 cm over the upper pole. Left Kidney: Renal measurements: 9.6 x 4.8 x 5.5 cm = volume: 131 mL. Moderate increased parenchymal echogenicity. 3.6 cm cyst over the mid pole with minimal dependent debris/calcification unchanged from 2015. Bladder: Appears normal for degree of bladder distention. Other: None. IMPRESSION: Normal size kidneys without hydronephrosis. Moderate increased cortical echogenicity which can be seen in medical renal disease. Small right renal cysts unchanged and 3.6 cm left renal cyst stable since 2015. Electronically Signed   By: Marin Olp M.D.   On: 11/23/2021 15:36     ASSESSMENT/PLAN:  This is a very pleasant 88 year old African-American male diagnosed with a stage IVa (T4, N3, M1 B) non-small cell lung cancer, adenosquamous carcinoma in March 2022 and presented with large mass involving the lateral right upper lobe and right chest wall soft tissue in addition to right hilar, mediastinal and right subpectoral and supraclavicular lymphadenopathy with PD-L1 expression of 90%.   The patient is status post a course of concurrent chemoradiation with carboplatin for an AUC of 2 and paclitaxel 45 mg per metered square.  He is status post 6 cycles.    The patient had evidence of disease progression following concurrent chemoradiation.   The patient is currently undergoing single agent immunotherapy with Libtayo IV every 3 weeks due to his PD-L1 expression of 90%.  The patient is status post 12 cycles.    Labs were reviewed.  I reviewed the patient's hypopigmentation with Dr. Julien Nordmann who also came to see the patient.  The hypopigmentation is worsening.  Unclear and this may be a checkpoint inhibitor mediated.  I will place referral to dermatology for  further evaluation of his hypopigmentation.  Dr. Julien Nordmann recommends that we test for cortisol and ACTH.  No significant hypotension or signs and symptoms of adrenal insufficiency.  We will arrange for him to have his cortisol and ACT H tested in the morning tomorrow.  He has baseline CKD with creatinine of 2.57. He sees nephrology in [place], [place]. Recommend he proceed with cycle #12 as scheduled.   I will arrange for a restaging CT scan of the chest, abdomen, and pelvis prior to starting his next cycle of treatment.    We will see him back for follow-up visit in 3 weeks for evaluation before starting cycle #14.  The patient was advised to call immediately if he has any concerning symptoms in the interval. The patient voices understanding of current disease status and treatment options and is  in agreement with the current care plan. All questions were answered. The patient knows to call the clinic with any problems, questions or concerns. We can certainly see the patient much sooner if necessary   Orders Placed This Encounter  Procedures   CT Chest W Contrast    Standing Status:   Future    Standing Expiration Date:   11/27/2022    Order Specific Question:   Informed consent is required to be obtained if the patient has a GFR less than 66m/min (consider nephrology consult)    Answer:   Informed consent obtained    Order Specific Question:   If indicated for the ordered procedure, I authorize the administration of contrast media per Radiology protocol    Answer:   Yes    Order Specific Question:   [facility]    [facility]  [facility]dom[facility] Contrast    Standing Status:   Future    Standing Expiration Date:   11/27/2022    Order Specific Question:   Informed consent is required to be obtained if the patient has a GFR less than 357mmin (consider nephrology consult)    Answer:   Informed consent obtained    Order Specific Question:   If indicated for the  ordered procedure, I authorize the administration of contrast media per Radiology protocol    Answer:   Yes    Order Specific Question:   [facility]    [facility]  Order Specific Question:   Is Oral Contrast requested for this exam?    Answer:   Yes, Per Radiology protocol   Cortisol    Standing Status:   Future    Standing Expiration Date:   11/27/2022   [facility]    Standing Status:   Future    Standing Expiration Date:   11/27/2022   Ambulatory referral to Dermatology    Referral Priority:   Urgent    Referral Type:   Consultation    Referral Reason:   Specialty Services Required    Requested Specialty:   Dermatology    [place] of Visits Requested:   [place]  ADDENDUM: Hematology/Oncology Attending: I had a face-to-face encounter with the patient today.  I reviewed his record, lab and recommended his care plan.  This is a very pleasant [age]ears old African-American male with a stage IV (T4, N3, M1b) non-small cell lung cancer, adenosquamous carcinoma diagnosed in May 2022 with PD-L1 expression of 90%.  The patient initially underwent a course of concurrent chemoradiation with weekly carboplatin and paclitaxel for 6 cycles and he had evidence for disease progression after this course of treatment. He is currently on treatment with single agent immunotherapy with Libtayo (Cempilimab) 350 Mg IV every 3 weeks status post 12 cycles.  The patient has been tolerating this treatment well with no concerning adverse effect except for changes in his skin color with hypopigmentation mainly on the face that could be immunotherapy related. We will order a.m. cortisol level as well as ACTH.  We will also consider referring the patient to dermatology for evaluation and biopsy of 1 of these lesions. The patient will continue his current treatment with Libtayo (Cempilimab) with the same dose for now. He will come back for follow-up  visit in 3 weeks for evaluation before the next cycle of his treatment. He was advised to call  immediately if he has any other concerning symptoms in the interval. Disclaimer: This note was dictated with voice recognition software. Similar sounding words can inadvertently be transcribed and may be missed upon review.  The total time spent in the appointment was 30 minutes.

## 2021-11-26 NOTE — Telephone Encounter (Signed)
Sch per 3/1 inbasket, pt  aware ?

## 2021-11-27 ENCOUNTER — Inpatient Hospital Stay (HOSPITAL_BASED_OUTPATIENT_CLINIC_OR_DEPARTMENT_OTHER): Payer: Medicare HMO | Admitting: Physician Assistant

## 2021-11-27 ENCOUNTER — Other Ambulatory Visit: Payer: Self-pay

## 2021-11-27 ENCOUNTER — Inpatient Hospital Stay: Payer: Medicare HMO | Attending: Physician Assistant

## 2021-11-27 ENCOUNTER — Inpatient Hospital Stay: Payer: Medicare HMO

## 2021-11-27 ENCOUNTER — Encounter: Payer: Self-pay | Admitting: Physician Assistant

## 2021-11-27 VITALS — Temp 98.2°F | Resp 17

## 2021-11-27 VITALS — BP 129/69 | HR 61 | Wt 153.2 lb

## 2021-11-27 DIAGNOSIS — Z5112 Encounter for antineoplastic immunotherapy: Secondary | ICD-10-CM | POA: Diagnosis not present

## 2021-11-27 DIAGNOSIS — C770 Secondary and unspecified malignant neoplasm of lymph nodes of head, face and neck: Secondary | ICD-10-CM | POA: Insufficient documentation

## 2021-11-27 DIAGNOSIS — L819 Disorder of pigmentation, unspecified: Secondary | ICD-10-CM | POA: Diagnosis not present

## 2021-11-27 DIAGNOSIS — C3411 Malignant neoplasm of upper lobe, right bronchus or lung: Secondary | ICD-10-CM

## 2021-11-27 DIAGNOSIS — C7989 Secondary malignant neoplasm of other specified sites: Secondary | ICD-10-CM | POA: Insufficient documentation

## 2021-11-27 DIAGNOSIS — Z79899 Other long term (current) drug therapy: Secondary | ICD-10-CM | POA: Diagnosis not present

## 2021-11-27 DIAGNOSIS — C781 Secondary malignant neoplasm of mediastinum: Secondary | ICD-10-CM | POA: Diagnosis not present

## 2021-11-27 LAB — CBC WITH DIFFERENTIAL (CANCER CENTER ONLY)
Abs Immature Granulocytes: 0 10*3/uL (ref 0.00–0.07)
Basophils Absolute: 0 10*3/uL (ref 0.0–0.1)
Basophils Relative: 1 %
Eosinophils Absolute: 0.1 10*3/uL (ref 0.0–0.5)
Eosinophils Relative: 3 %
HCT: 34.4 % — ABNORMAL LOW (ref 39.0–52.0)
Hemoglobin: 10.8 g/dL — ABNORMAL LOW (ref 13.0–17.0)
Immature Granulocytes: 0 %
Lymphocytes Relative: 22 %
Lymphs Abs: 0.5 10*3/uL — ABNORMAL LOW (ref 0.7–4.0)
MCH: 29.8 pg (ref 26.0–34.0)
MCHC: 31.4 g/dL (ref 30.0–36.0)
MCV: 95 fL (ref 80.0–100.0)
Monocytes Absolute: 0.4 10*3/uL (ref 0.1–1.0)
Monocytes Relative: 15 %
Neutro Abs: 1.4 10*3/uL — ABNORMAL LOW (ref 1.7–7.7)
Neutrophils Relative %: 59 %
Platelet Count: 145 10*3/uL — ABNORMAL LOW (ref 150–400)
RBC: 3.62 MIL/uL — ABNORMAL LOW (ref 4.22–5.81)
RDW: 15 % (ref 11.5–15.5)
WBC Count: 2.4 10*3/uL — ABNORMAL LOW (ref 4.0–10.5)
nRBC: 0 % (ref 0.0–0.2)

## 2021-11-27 LAB — CMP (CANCER CENTER ONLY)
ALT: 59 U/L — ABNORMAL HIGH (ref 0–44)
AST: 34 U/L (ref 15–41)
Albumin: 3.3 g/dL — ABNORMAL LOW (ref 3.5–5.0)
Alkaline Phosphatase: 83 U/L (ref 38–126)
Anion gap: 5 (ref 5–15)
BUN: 48 mg/dL — ABNORMAL HIGH (ref 8–23)
CO2: 26 mmol/L (ref 22–32)
Calcium: 9.1 mg/dL (ref 8.9–10.3)
Chloride: 109 mmol/L (ref 98–111)
Creatinine: 2.57 mg/dL — ABNORMAL HIGH (ref 0.61–1.24)
GFR, Estimated: 24 mL/min — ABNORMAL LOW (ref >60)
Glucose, Bld: 90 mg/dL (ref 70–99)
Potassium: 4.3 mmol/L (ref 3.5–5.1)
Sodium: 140 mmol/L (ref 135–145)
Total Bilirubin: 0.5 mg/dL (ref 0.3–1.2)
Total Protein: 7.4 g/dL (ref 6.5–8.1)

## 2021-11-27 LAB — TSH: TSH: 5.05 u[IU]/mL — ABNORMAL HIGH (ref 0.320–4.118)

## 2021-11-27 MED ORDER — SODIUM CHLORIDE 0.9 % IV SOLN
350.0000 mg | Freq: Once | INTRAVENOUS | Status: AC
  Administered 2021-11-27: 350 mg via INTRAVENOUS
  Filled 2021-11-27: qty 7

## 2021-11-27 MED ORDER — SODIUM CHLORIDE 0.9 % IV SOLN: Freq: Once | INTRAVENOUS | Status: AC

## 2021-11-27 MED ORDER — PROCHLORPERAZINE MALEATE 10 MG PO TABS: 10.0000 mg | ORAL_TABLET | Freq: Four times a day (QID) | ORAL | 2 refills | Status: DC | PRN

## 2021-11-27 NOTE — Progress Notes (Signed)
Per Cassandra PA-C, pt ok to treat today with ANC 1.4 and creat 2.57. ?

## 2021-11-27 NOTE — Patient Instructions (Signed)
[facility]  Discharge Instructions: ?Thank you for choosing [facility] to provide your oncology and hematology care.  ? ?If you have a lab appointment with the [facility], please go directly to the [facility] and check in at the registration area. ?  ?Wear comfortable clothing and clothing appropriate for easy access to any Portacath or PICC line.  ? ?We strive to give you quality time with your provider. You may need to reschedule your appointment if you arrive late (15 or more minutes).  Arriving late affects you and other patients whose appointments are after yours.  Also, if you miss three or more appointments without notifying the office, you may be dismissed from the clinic at the provider?s discretion.    ?  ?For prescription refill requests, have your pharmacy contact our office and allow 72 hours for refills to be completed.   ? ?Today you received the following chemotherapy and/or immunotherapy agents: Libtayo    ?  ?To help prevent nausea and vomiting after your treatment, we encourage you to take your nausea medication as directed. ? ?BELOW ARE SYMPTOMS THAT SHOULD BE REPORTED IMMEDIATELY: ?*FEVER GREATER THAN 100.4 F (38 ?C) OR HIGHER ?*CHILLS OR SWEATING ?*NAUSEA AND VOMITING THAT IS NOT CONTROLLED WITH YOUR NAUSEA MEDICATION ?*UNUSUAL SHORTNESS OF BREATH ?*UNUSUAL BRUISING OR BLEEDING ?*URINARY PROBLEMS (pain or burning when urinating, or frequent urination) ?*BOWEL PROBLEMS (unusual diarrhea, constipation, pain near the anus) ?TENDERNESS IN MOUTH AND THROAT WITH OR WITHOUT PRESENCE OF ULCERS (sore throat, sores in mouth, or a toothache) ?UNUSUAL RASH, SWELLING OR PAIN  ?UNUSUAL VAGINAL DISCHARGE OR ITCHING  ? ?Items with * indicate a potential emergency and should be followed up as soon as possible or go to the Emergency Department if any problems should occur. ? ?Please show the CHEMOTHERAPY ALERT CARD or IMMUNOTHERAPY ALERT CARD at check-in to  the Emergency Department and triage nurse. ? ?Should you have questions after your visit or need to cancel or reschedule your appointment, please contact [facility]  Dept: [phone]  and follow the prompts.  Office hours are 8:00 a.m. to 4:30 p.m. Monday - Friday. Please note that voicemails left after 4:00 p.m. may not be returned until the following business day.  We are closed weekends and major holidays. You have access to a nurse at all times for urgent questions. Please call the main number to the clinic Dept: [phone] and follow the prompts. ? ? ?For any non-urgent questions, you may also contact your provider using MyChart. We now offer e-Visits for anyone 49 and older to request care online for non-urgent symptoms. For details visit [email or web address]. ?  ?Also download the MyChart app! Go to the app store, search "MyChart", open the app, select [facility], and log in with your MyChart username and password. ? ?Due to Covid, a mask is required upon entering the hospital/clinic. If you do not have a mask, one will be given to you upon arrival. For doctor visits, patients may have 1 support person aged 74 or older with them. For treatment visits, patients cannot have anyone with them due to current Covid guidelines and our immunocompromised population.  ? ?

## 2021-11-28 ENCOUNTER — Inpatient Hospital Stay: Payer: Medicare HMO

## 2021-11-28 DIAGNOSIS — C7989 Secondary malignant neoplasm of other specified sites: Secondary | ICD-10-CM | POA: Diagnosis not present

## 2021-11-28 DIAGNOSIS — C770 Secondary and unspecified malignant neoplasm of lymph nodes of head, face and neck: Secondary | ICD-10-CM | POA: Diagnosis not present

## 2021-11-28 DIAGNOSIS — C3411 Malignant neoplasm of upper lobe, right bronchus or lung: Secondary | ICD-10-CM | POA: Diagnosis not present

## 2021-11-28 DIAGNOSIS — Z79899 Other long term (current) drug therapy: Secondary | ICD-10-CM | POA: Diagnosis not present

## 2021-11-28 DIAGNOSIS — Z5112 Encounter for antineoplastic immunotherapy: Secondary | ICD-10-CM | POA: Diagnosis not present

## 2021-11-28 DIAGNOSIS — C781 Secondary malignant neoplasm of mediastinum: Secondary | ICD-10-CM | POA: Diagnosis not present

## 2021-11-28 LAB — CORTISOL: Cortisol, Plasma: 13.5 ug/dL

## 2021-11-29 ENCOUNTER — Encounter: Payer: Self-pay | Admitting: Internal Medicine

## 2021-11-29 LAB — ACTH: C206 ACTH: 29.1 pg/mL (ref 7.2–63.3)

## 2021-12-04 ENCOUNTER — Encounter: Payer: Self-pay | Admitting: Dermatology

## 2021-12-04 ENCOUNTER — Ambulatory Visit: Payer: Medicare HMO | Admitting: Dermatology

## 2021-12-04 ENCOUNTER — Other Ambulatory Visit: Payer: Self-pay

## 2021-12-04 DIAGNOSIS — L80 Vitiligo: Secondary | ICD-10-CM

## 2021-12-04 NOTE — Patient Instructions (Addendum)
ZANDERM: VITILIGO COVER UP  ?

## 2021-12-04 NOTE — Progress Notes (Unsigned)
° °  New Patient   Subjective  [name] is a 88 y.o. male who presents for the following: New Patient (Initial Visit) (Pt, here with Son in law, Fonnie Jarvis,  has been getting light patches on the face and hands x59yr. Pt gets IV chemo for lung cancer and thinks that is what is causing).  *** Location:  Duration:  Quality:  Associated Signs/Symptoms: Modifying Factors:  Severity:  Timing: Context:    The following portions of the chart were reviewed this encounter and updated as appropriate:  Tobacco   Allergies   Meds   Problems   Med Hx   Surg Hx   Fam Hx       Objective  Well appearing patient in no apparent distress; mood and affect are within normal limits. Head - Anterior (Face), Left Hand - Posterior, Right Hand - Posterior Drug induced from IV Chemo         {Exam:[id number]::"A full examination was performed including scalp, head, eyes, ears, nose, lips, neck, chest, axillae, abdomen, back, buttocks, bilateral upper extremities, bilateral lower extremities, hands, feet, fingers, toes, fingernails, and toenails. All findings within normal limits unless otherwise noted below."}   Assessment & Plan  Vitiligo Head - Anterior (Face); Left Hand - Posterior; Right Hand - Posterior  OVER THE COUNTER ZANDERM VITILIGO COVER UP   Vitiligo Vitiligo is a spontaneous autoimmune disorder causing the loss of functional melanocytes in the epidermis. ICI-induced vitiligo results in depigmented macules developing into plaques on photo-exposed skin (Fig. 5). Anti-PD-1 ICIs activate CD8+ T cells against melanoma-associated antigens shared by normal melanocytes and melanomas.25,26 It has been reported that anti-CTLA-4 ICIs also induce MelanA-specific CD8+ T cells around apoptotic melanocytes.16 The occurrence of vitiligo has been shown to be delayed more than 7 weeks after the initial administration and associated with favorable progression of melanoma.25,26 Because ICI-induced and  spontaneous vitiligo rarely resolve, no specific treatments are applied to patients presenting with this type of cutaneous irAE.Immune-related adverse events in various organs caused by immune checkpoint inhibitors Author links open overlay panelNaoko Okiyama a b, Ryota Baldomero Lamy a Show more Add to [employer] Cite [email or web address]

## 2021-12-15 ENCOUNTER — Other Ambulatory Visit: Payer: Self-pay | Admitting: Physician Assistant

## 2021-12-15 DIAGNOSIS — C3411 Malignant neoplasm of upper lobe, right bronchus or lung: Secondary | ICD-10-CM

## 2021-12-15 NOTE — Progress Notes (Signed)
[place] [facility] ?OFFICE PROGRESS NOTE ? ?Brian [name] Squibb, MD ?Brian [name] [name] ?[place] [address] ? 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 ?  ?Biomarker Findings ?Tumor Mutational Burden - 10 Muts/Mb ?Microsatellite status - MS-Stable ?Genomic Findings ?For a complete list of Brian [name] genes assayed, please refer to Brian [name] Appendix. ?KRAS G12C ?NFKBIA amplification ?NKX2-1 amplification ?RBM10 D610f*80 ?7 Disease relevant genes with no reportable ?alterations: ALK, BRAF, EGFR, ERBB2, MET, RET, ROS1 ?  ?PDL1: 90% ? ?PRIOR THERAPY: Weekly concurrent chemoradiation with carboplatin for an AUC of 2 and paclitaxel 45 mg/m?.Marland Kitchen First dose on 12/30/2020.  Status post 6 cycles ? ?CURRENT THERAPY:  First-line treatment with immunotherapy with Libtayo (Cempilimab) 350 Mg IV every 3 weeks. First dose on 03/19/21. Status post 13 cycles. ? ?INTERVAL HISTORY: ?Brian [name] [name] returns to [name] clinic today for a follow-up visit accompanied by his son-in-law.  Brian [name] [name] is feeling fairly well today without any concern complaints except he has been having areas of hypopigmentation on his face for a few months but it has gotten worse recently.  Brian [name] appearance is similar to vitiligo and Brian [name] [name] was referred to dermatology for evaluation.  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  They did not recommend any specific treatment as spontaneous vitiligo rarely resolves and there is no specific treatment to be applied.  Brian [name] [name] has chronic kidney disease and would likely have a challenging time with changing treatment to chemotherapy given his age and kidney dysfunction.He does not have any other signs of any other  checkpoint inhibitor toxicities. ? ?Otherwise, he feels well. He denies any fever, chills, or night sweats.  He is followed by member Brian [name] nutritionist team for his weight loss. He gained 5 lbs since his last appointment. He reports stable fatigue. He reports dyspnea on exertion which is stable. He has an intermittent cough. Denies any chest pain or hemoptysis. Denies any headache or visual changes.  Brian [name] [name] recently had a restaging CT scan performed.  He is here today for evaluation and repeat blood work before his next cycle of treatment with cycle #14. ? ? ? ? ?MEDICAL HISTORY: ?Past Medical History:  ?Diagnosis Date  ? Arthritis   ? Hypertension   ? Hyperthyroidism   ? lung ca 11/2020  ? ? ?ALLERGIES:  has No Known Allergies. ? ?MEDICATIONS:  ?Current Outpatient Medications  ?Medication Sig Dispense Refill  ? gabapentin (NEURONTIN) 250 MG/5ML solution Take 3 mLs (150 mg total) by mouth at bedtime. 90 mL 2  ? labetalol (NORMODYNE) 200 MG tablet Take 200 mg by mouth 2 (two) times daily.    ? levothyroxine (SYNTHROID) 175 MCG tablet TAKE 1 TABLET BY MOUTH DAILY BEFORE BREAKFAST. 30 tablet 0  ? loperamide (IMODIUM) 2 MG capsule Take by mouth as needed for diarrhea or loose stools.    ? naproxen (NAPROSYN) 500 MG tablet Take 500 mg by mouth 2 (two) times daily as needed for moderate pain.    ? Olmesartan-Amlodipine-HCTZ 40-10-12.5 MG TABS Take 1 tablet by mouth daily.    ? oxyCODONE-acetaminophen (PERCOCET/ROXICET) 5-325 MG tablet Take 1 tablet by mouth every 8 (eight) hours as needed for severe pain. 30 tablet 0  ? prochlorperazine (COMPAZINE) 10 MG  tablet Take 1 tablet (10 mg total) by mouth every 6 (six) hours as needed for nausea or vomiting. 30 tablet 2  ? tadalafil (CIALIS) 5 MG tablet Take 5 mg by mouth daily.    ? tamsulosin (FLOMAX) 0.4 MG CAPS capsule Take 0.4 mg by mouth daily.    ? ?No current facility-administered medications for this visit.  ? ? ?SURGICAL HISTORY:  ?Past Surgical History:  ?Procedure  Laterality Date  ? COLONOSCOPY N/A 09/02/2016  ? Procedure: COLONOSCOPY;  Surgeon: Rogene Houston, MD;  [facility];  Service: Endoscopy;  Laterality: N/A;  830  ? NO PAST SURGERIES    ? POLYPECTOMY  09/02/2016  ? Procedure: POLYPECTOMY;  Surgeon: Rogene Houston, MD;  Location: [facility];  Service: Endoscopy;;  ? ? ?REVIEW OF SYSTEMS:   ?Constitutional: Positive for stable fatigue.  Positive for weight loss.  Negative for chills, decrease appetite, and fever. ?HENT:  Negative for mouth sores, nosebleeds, or sore throat. ?Eyes: Negative for eye problems and icterus.  ?Respiratory: Positive for stable shortness of breath with exertion and intermittent cough. Negative for hemoptysis and wheezing.  ?Cardiovascular: Negative for chest pain and leg swelling.  ?Gastrointestinal: Negative for abdominal pain, nausea, vomiting, and diarrhea, and constipation. ?Genitourinary: Negative for bladder incontinence, difficulty urinating, dysuria, frequency and hematuria.   ?Musculoskeletal: Negative for back pain, gait problem, neck pain and neck stiffness.  ?Skin: Positive for hypopigmentation on face.  ?Neurological: Negative for dizziness, extremity weakness, gait problem, headaches, light-headedness and seizures.  ?Hematological: Negative for adenopathy. Does not bruise/bleed easily.  ?Psychiatric/Behavioral: Negative for confusion, depression and sleep disturbance. Brian [name] [name] is not nervous/anxious.    ?  ? ? ?PHYSICAL EXAMINATION:  ?Blood pressure 135/74, pulse 61, temperature (!) 97.1 ?[name] (36.2 ?C), temperature source Tympanic, resp. rate 16, weight 158 lb 3.2 oz (71.8 kg), SpO2 99 %. ? ?ECOG PERFORMANCE STATUS: 1-2 ? ?Physical Exam  ?Constitutional: Oriented to person, place, and time and thin appearing [name] and in no distress.  ?HENT:  ?Head: Normocephalic and atraumatic.  ?Mouth/Throat: Oropharynx is clear and moist. No oropharyngeal exudate.  No evidence of thrush ?Eyes: Conjunctivae are normal. Right eye  exhibits no discharge. Left eye exhibits no discharge. No scleral icterus.  ?Neck: Normal range of motion. Neck supple.  ?Cardiovascular: Normal rate, regular rhythm, normal heart sounds and intact distal pulses.   ?Pulmonary/Chest: Effort normal and breath sounds normal. No respiratory distress. No wheezes. No rales.  ?Abdominal: Soft. Bowel sounds are normal. Exhibits no distension and no mass. There is no tenderness.  ?Musculoskeletal: Normal range of motion. Exhibits no edema.  ?Lymphadenopathy:  ?  No cervical adenopathy.  ?Neurological: Alert and oriented to person, place, and time. Exhibits muscle wasting.  Brian [name] [name] was examined in Brian [name] wheelchair. ?Skin: Hypopigmentation on his face and upper torso. Mild skin peeling. Skin is warm and dry. Not diaphoretic. No erythema. No pallor.  ?Psychiatric: Mood, memory and judgment normal.  ?Vitals reviewed. ? ?Psychiatric: Mood, memory and judgment normal.  ?Vitals reviewed. ? ?LABORATORY DATA: ?Lab Results  ?Component Value Date  ? WBC 3.0 (L) 12/17/2021  ? HGB 10.4 (L) 12/17/2021  ? HCT 32.4 (L) 12/17/2021  ? MCV 93.9 12/17/2021  ? PLT 155 12/17/2021  ? ? ?  Chemistry   ?   ?Component Value Date/Time  ? NA 139 12/17/2021 0920  ? K 4.6 12/17/2021 0920  ? CL 108 12/17/2021 0920  ? CO2 25 12/17/2021 0920  ? BUN 43 (H) 12/17/2021  0920  ? CREATININE 2.64 (H) 12/17/2021 0920  ?    ?Component Value Date/Time  ? CALCIUM 9.2 12/17/2021 0920  ? ALKPHOS 78 12/17/2021 0920  ? AST 14 (L) 12/17/2021 0920  ? ALT 16 12/17/2021 0920  ? BILITOT 0.4 12/17/2021 0920  ?  ? ? ? ?RADIOGRAPHIC STUDIES: ? ?CT Abdomen Pelvis Wo Contrast ? ?Result Date: 12/17/2021 ?CLINICAL DATA:  Primary Cancer Type: Lung * Tracking Code: BO * Imaging Indication: Assess response to therapy Interval therapy since last imaging? Yes Initial Cancer Diagnosis Date: 12/12/2020; Established by: Biopsy-proven Detailed Pathology: Stage IVA non-small cell lung cancer, adenosquamous carcinoma. Primary Tumor location:  Lateral right upper lobe and right chest wall. Surgeries: No. Chemotherapy: Yes; Ongoing? No; Most recent administration: 01/20/2021 Immunotherapy?  Yes; Type: Libtayo; Ongoing? Yes Radiation therapy? Yes;

## 2021-12-16 ENCOUNTER — Other Ambulatory Visit: Payer: Self-pay

## 2021-12-16 ENCOUNTER — Encounter (HOSPITAL_COMMUNITY): Payer: Self-pay

## 2021-12-16 ENCOUNTER — Ambulatory Visit (HOSPITAL_COMMUNITY)
Admission: RE | Admit: 2021-12-16 | Discharge: 2021-12-16 | Disposition: A | Discharge status: Home / Self Care | Payer: Medicare HMO | Source: Ambulatory Visit | Attending: Physician Assistant | Admitting: Physician Assistant

## 2021-12-16 DIAGNOSIS — C3411 Malignant neoplasm of upper lobe, right bronchus or lung: Secondary | ICD-10-CM | POA: Diagnosis not present

## 2021-12-16 DIAGNOSIS — D7389 Other diseases of spleen: Secondary | ICD-10-CM | POA: Diagnosis not present

## 2021-12-16 DIAGNOSIS — K6389 Other specified diseases of intestine: Secondary | ICD-10-CM | POA: Diagnosis not present

## 2021-12-16 DIAGNOSIS — J90 Pleural effusion, not elsewhere classified: Secondary | ICD-10-CM | POA: Diagnosis not present

## 2021-12-16 DIAGNOSIS — J479 Bronchiectasis, uncomplicated: Secondary | ICD-10-CM | POA: Diagnosis not present

## 2021-12-16 DIAGNOSIS — J432 Centrilobular emphysema: Secondary | ICD-10-CM | POA: Diagnosis not present

## 2021-12-16 DIAGNOSIS — J181 Lobar pneumonia, unspecified organism: Secondary | ICD-10-CM | POA: Diagnosis not present

## 2021-12-16 DIAGNOSIS — R188 Other ascites: Secondary | ICD-10-CM | POA: Diagnosis not present

## 2021-12-16 DIAGNOSIS — N3289 Other specified disorders of bladder: Secondary | ICD-10-CM | POA: Diagnosis not present

## 2021-12-17 ENCOUNTER — Inpatient Hospital Stay: Payer: Medicare HMO

## 2021-12-17 ENCOUNTER — Inpatient Hospital Stay (HOSPITAL_BASED_OUTPATIENT_CLINIC_OR_DEPARTMENT_OTHER): Payer: Medicare HMO | Admitting: Physician Assistant

## 2021-12-17 VITALS — BP 135/74 | HR 61 | Temp 97.1°F | Resp 16 | Wt 158.2 lb

## 2021-12-17 DIAGNOSIS — C3411 Malignant neoplasm of upper lobe, right bronchus or lung: Secondary | ICD-10-CM | POA: Diagnosis not present

## 2021-12-17 DIAGNOSIS — L80 Vitiligo: Secondary | ICD-10-CM

## 2021-12-17 DIAGNOSIS — C770 Secondary and unspecified malignant neoplasm of lymph nodes of head, face and neck: Secondary | ICD-10-CM | POA: Diagnosis not present

## 2021-12-17 DIAGNOSIS — C781 Secondary malignant neoplasm of mediastinum: Secondary | ICD-10-CM | POA: Diagnosis not present

## 2021-12-17 DIAGNOSIS — C7989 Secondary malignant neoplasm of other specified sites: Secondary | ICD-10-CM | POA: Diagnosis not present

## 2021-12-17 DIAGNOSIS — Z5112 Encounter for antineoplastic immunotherapy: Secondary | ICD-10-CM | POA: Diagnosis not present

## 2021-12-17 DIAGNOSIS — Z79899 Other long term (current) drug therapy: Secondary | ICD-10-CM | POA: Diagnosis not present

## 2021-12-17 DIAGNOSIS — E039 Hypothyroidism, unspecified: Secondary | ICD-10-CM

## 2021-12-17 LAB — CBC WITH DIFFERENTIAL (CANCER CENTER ONLY)
Abs Immature Granulocytes: 0.01 10*3/uL (ref 0.00–0.07)
Basophils Absolute: 0 10*3/uL (ref 0.0–0.1)
Basophils Relative: 0 %
Eosinophils Absolute: 0.2 10*3/uL (ref 0.0–0.5)
Eosinophils Relative: 6 %
HCT: 32.4 % — ABNORMAL LOW (ref 39.0–52.0)
Hemoglobin: 10.4 g/dL — ABNORMAL LOW (ref 13.0–17.0)
Immature Granulocytes: 0 %
Lymphocytes Relative: 20 %
Lymphs Abs: 0.6 10*3/uL — ABNORMAL LOW (ref 0.7–4.0)
MCH: 30.1 pg (ref 26.0–34.0)
MCHC: 32.1 g/dL (ref 30.0–36.0)
MCV: 93.9 fL (ref 80.0–100.0)
Monocytes Absolute: 0.3 10*3/uL (ref 0.1–1.0)
Monocytes Relative: 9 %
Neutro Abs: 1.9 10*3/uL (ref 1.7–7.7)
Neutrophils Relative %: 65 %
Platelet Count: 155 10*3/uL (ref 150–400)
RBC: 3.45 MIL/uL — ABNORMAL LOW (ref 4.22–5.81)
RDW: 14.9 % (ref 11.5–15.5)
WBC Count: 3 10*3/uL — ABNORMAL LOW (ref 4.0–10.5)
nRBC: 0 % (ref 0.0–0.2)

## 2021-12-17 LAB — CMP (CANCER CENTER ONLY)
ALT: 16 U/L (ref 0–44)
AST: 14 U/L — ABNORMAL LOW (ref 15–41)
Albumin: 3.3 g/dL — ABNORMAL LOW (ref 3.5–5.0)
Alkaline Phosphatase: 78 U/L (ref 38–126)
Anion gap: 6 (ref 5–15)
BUN: 43 mg/dL — ABNORMAL HIGH (ref 8–23)
CO2: 25 mmol/L (ref 22–32)
Calcium: 9.2 mg/dL (ref 8.9–10.3)
Chloride: 108 mmol/L (ref 98–111)
Creatinine: 2.64 mg/dL — ABNORMAL HIGH (ref 0.61–1.24)
GFR, Estimated: 23 mL/min — ABNORMAL LOW (ref >60)
Glucose, Bld: 99 mg/dL (ref 70–99)
Potassium: 4.6 mmol/L (ref 3.5–5.1)
Sodium: 139 mmol/L (ref 135–145)
Total Bilirubin: 0.4 mg/dL (ref 0.3–1.2)
Total Protein: 7.5 g/dL (ref 6.5–8.1)

## 2021-12-17 LAB — TSH: TSH: 10.974 u[IU]/mL — ABNORMAL HIGH (ref 0.320–4.118)

## 2021-12-17 MED ORDER — SODIUM CHLORIDE 0.9 % IV SOLN: Freq: Once | INTRAVENOUS | Status: AC

## 2021-12-17 MED ORDER — SODIUM CHLORIDE 0.9 % IV SOLN
350.0000 mg | Freq: Once | INTRAVENOUS | Status: AC
  Administered 2021-12-17: 350 mg via INTRAVENOUS
  Filled 2021-12-17: qty 7

## 2021-12-17 MED ORDER — LEVOTHYROXINE SODIUM 200 MCG PO TABS: 200.0000 ug | ORAL_TABLET | Freq: Every day | ORAL | 2 refills | Status: DC

## 2021-12-17 NOTE — Progress Notes (Signed)
Per Cassie, PA OK to trt w/ SCR 2.64 today ?

## 2021-12-17 NOTE — Patient Instructions (Signed)
[facility]  Discharge Instructions: ?Thank you for choosing [facility] to provide your oncology and hematology care.  ? ?If you have a lab appointment with the [facility], please go directly to the [facility] and check in at the registration area. ?  ?Wear comfortable clothing and clothing appropriate for easy access to any Portacath or PICC line.  ? ?We strive to give you quality time with your provider. You may need to reschedule your appointment if you arrive late (15 or more minutes).  Arriving late affects you and other patients whose appointments are after yours.  Also, if you miss three or more appointments without notifying the office, you may be dismissed from the clinic at the provider?s discretion.    ?  ?For prescription refill requests, have your pharmacy contact our office and allow 72 hours for refills to be completed.   ? ?Today you received the following chemotherapy and/or immunotherapy agents: Libtayo    ?  ?To help prevent nausea and vomiting after your treatment, we encourage you to take your nausea medication as directed. ? ?BELOW ARE SYMPTOMS THAT SHOULD BE REPORTED IMMEDIATELY: ?*FEVER GREATER THAN 100.4 F (38 ?C) OR HIGHER ?*CHILLS OR SWEATING ?*NAUSEA AND VOMITING THAT IS NOT CONTROLLED WITH YOUR NAUSEA MEDICATION ?*UNUSUAL SHORTNESS OF BREATH ?*UNUSUAL BRUISING OR BLEEDING ?*URINARY PROBLEMS (pain or burning when urinating, or frequent urination) ?*BOWEL PROBLEMS (unusual diarrhea, constipation, pain near the anus) ?TENDERNESS IN MOUTH AND THROAT WITH OR WITHOUT PRESENCE OF ULCERS (sore throat, sores in mouth, or a toothache) ?UNUSUAL RASH, SWELLING OR PAIN  ?UNUSUAL VAGINAL DISCHARGE OR ITCHING  ? ?Items with * indicate a potential emergency and should be followed up as soon as possible or go to the Emergency Department if any problems should occur. ? ?Please show the CHEMOTHERAPY ALERT CARD or IMMUNOTHERAPY ALERT CARD at check-in to  the Emergency Department and triage nurse. ? ?Should you have questions after your visit or need to cancel or reschedule your appointment, please contact [facility]  Dept: [phone]  and follow the prompts.  Office hours are 8:00 a.m. to 4:30 p.m. Monday - Friday. Please note that voicemails left after 4:00 p.m. may not be returned until the following business day.  We are closed weekends and major holidays. You have access to a nurse at all times for urgent questions. Please call the main number to the clinic Dept: [phone] and follow the prompts. ? ? ?For any non-urgent questions, you may also contact your provider using MyChart. We now offer e-Visits for anyone 49 and older to request care online for non-urgent symptoms. For details visit [email or web address]. ?  ?Also download the MyChart app! Go to the app store, search "MyChart", open the app, select [facility], and log in with your MyChart username and password. ? ?Due to Covid, a mask is required upon entering the hospital/clinic. If you do not have a mask, one will be given to you upon arrival. For doctor visits, patients may have 1 support person aged 53 or older with them. For treatment visits, patients cannot have anyone with them due to current Covid guidelines and our immunocompromised population.  ? ?

## 2021-12-18 ENCOUNTER — Encounter: Payer: Self-pay | Admitting: Internal Medicine

## 2021-12-22 DIAGNOSIS — N184 Chronic kidney disease, stage 4 (severe): Secondary | ICD-10-CM | POA: Diagnosis not present

## 2021-12-22 DIAGNOSIS — I129 Hypertensive chronic kidney disease with stage 1 through stage 4 chronic kidney disease, or unspecified chronic kidney disease: Secondary | ICD-10-CM | POA: Diagnosis not present

## 2021-12-22 DIAGNOSIS — M1990 Unspecified osteoarthritis, unspecified site: Secondary | ICD-10-CM | POA: Diagnosis not present

## 2021-12-22 DIAGNOSIS — C3411 Malignant neoplasm of upper lobe, right bronchus or lung: Secondary | ICD-10-CM | POA: Diagnosis not present

## 2021-12-22 DIAGNOSIS — N400 Enlarged prostate without lower urinary tract symptoms: Secondary | ICD-10-CM | POA: Diagnosis not present

## 2021-12-22 DIAGNOSIS — D631 Anemia in chronic kidney disease: Secondary | ICD-10-CM | POA: Diagnosis not present

## 2022-01-06 DIAGNOSIS — Z809 Family history of malignant neoplasm, unspecified: Secondary | ICD-10-CM | POA: Diagnosis not present

## 2022-01-06 DIAGNOSIS — Z85118 Personal history of other malignant neoplasm of bronchus and lung: Secondary | ICD-10-CM | POA: Diagnosis not present

## 2022-01-06 DIAGNOSIS — Z008 Encounter for other general examination: Secondary | ICD-10-CM | POA: Diagnosis not present

## 2022-01-06 DIAGNOSIS — E039 Hypothyroidism, unspecified: Secondary | ICD-10-CM | POA: Diagnosis not present

## 2022-01-06 DIAGNOSIS — L309 Dermatitis, unspecified: Secondary | ICD-10-CM | POA: Diagnosis not present

## 2022-01-06 DIAGNOSIS — M1990 Unspecified osteoarthritis, unspecified site: Secondary | ICD-10-CM | POA: Diagnosis not present

## 2022-01-06 DIAGNOSIS — N400 Enlarged prostate without lower urinary tract symptoms: Secondary | ICD-10-CM | POA: Diagnosis not present

## 2022-01-06 DIAGNOSIS — N189 Chronic kidney disease, unspecified: Secondary | ICD-10-CM | POA: Diagnosis not present

## 2022-01-06 DIAGNOSIS — R062 Wheezing: Secondary | ICD-10-CM | POA: Diagnosis not present

## 2022-01-06 DIAGNOSIS — I739 Peripheral vascular disease, unspecified: Secondary | ICD-10-CM | POA: Diagnosis not present

## 2022-01-06 DIAGNOSIS — I129 Hypertensive chronic kidney disease with stage 1 through stage 4 chronic kidney disease, or unspecified chronic kidney disease: Secondary | ICD-10-CM | POA: Diagnosis not present

## 2022-01-07 ENCOUNTER — Inpatient Hospital Stay: Payer: Medicare HMO

## 2022-01-07 ENCOUNTER — Encounter: Payer: Self-pay | Admitting: *Deleted

## 2022-01-07 ENCOUNTER — Inpatient Hospital Stay: Payer: Medicare HMO | Attending: Physician Assistant | Admitting: Internal Medicine

## 2022-01-07 ENCOUNTER — Other Ambulatory Visit: Payer: Self-pay

## 2022-01-07 VITALS — BP 122/62 | HR 58 | Temp 96.1°F | Resp 17 | Ht 74.0 in | Wt 158.9 lb

## 2022-01-07 DIAGNOSIS — Z5112 Encounter for antineoplastic immunotherapy: Secondary | ICD-10-CM | POA: Insufficient documentation

## 2022-01-07 DIAGNOSIS — C7951 Secondary malignant neoplasm of bone: Secondary | ICD-10-CM | POA: Insufficient documentation

## 2022-01-07 DIAGNOSIS — C3411 Malignant neoplasm of upper lobe, right bronchus or lung: Secondary | ICD-10-CM | POA: Insufficient documentation

## 2022-01-07 DIAGNOSIS — Z79899 Other long term (current) drug therapy: Secondary | ICD-10-CM | POA: Insufficient documentation

## 2022-01-07 LAB — CMP (CANCER CENTER ONLY)
ALT: 12 U/L (ref 0–44)
AST: 13 U/L — ABNORMAL LOW (ref 15–41)
Albumin: 3.1 g/dL — ABNORMAL LOW (ref 3.5–5.0)
Alkaline Phosphatase: 68 U/L (ref 38–126)
Anion gap: 5 (ref 5–15)
BUN: 38 mg/dL — ABNORMAL HIGH (ref 8–23)
CO2: 24 mmol/L (ref 22–32)
Calcium: 8.8 mg/dL — ABNORMAL LOW (ref 8.9–10.3)
Chloride: 110 mmol/L (ref 98–111)
Creatinine: 2.45 mg/dL — ABNORMAL HIGH (ref 0.61–1.24)
GFR, Estimated: 25 mL/min — ABNORMAL LOW (ref >60)
Glucose, Bld: 92 mg/dL (ref 70–99)
Potassium: 4.5 mmol/L (ref 3.5–5.1)
Sodium: 139 mmol/L (ref 135–145)
Total Bilirubin: 0.3 mg/dL (ref 0.3–1.2)
Total Protein: 7.5 g/dL (ref 6.5–8.1)

## 2022-01-07 LAB — CBC WITH DIFFERENTIAL (CANCER CENTER ONLY)
Abs Immature Granulocytes: 0 10*3/uL (ref 0.00–0.07)
Basophils Absolute: 0 10*3/uL (ref 0.0–0.1)
Basophils Relative: 1 %
Eosinophils Absolute: 0.1 10*3/uL (ref 0.0–0.5)
Eosinophils Relative: 4 %
HCT: 31 % — ABNORMAL LOW (ref 39.0–52.0)
Hemoglobin: 10 g/dL — ABNORMAL LOW (ref 13.0–17.0)
Immature Granulocytes: 0 %
Lymphocytes Relative: 16 %
Lymphs Abs: 0.6 10*3/uL — ABNORMAL LOW (ref 0.7–4.0)
MCH: 30.4 pg (ref 26.0–34.0)
MCHC: 32.3 g/dL (ref 30.0–36.0)
MCV: 94.2 fL (ref 80.0–100.0)
Monocytes Absolute: 0.4 10*3/uL (ref 0.1–1.0)
Monocytes Relative: 10 %
Neutro Abs: 2.5 10*3/uL (ref 1.7–7.7)
Neutrophils Relative %: 69 %
Platelet Count: 174 10*3/uL (ref 150–400)
RBC: 3.29 MIL/uL — ABNORMAL LOW (ref 4.22–5.81)
RDW: 15.6 % — ABNORMAL HIGH (ref 11.5–15.5)
WBC Count: 3.6 10*3/uL — ABNORMAL LOW (ref 4.0–10.5)
nRBC: 0 % (ref 0.0–0.2)

## 2022-01-07 LAB — TSH: TSH: 8.222 u[IU]/mL — ABNORMAL HIGH (ref 0.320–4.118)

## 2022-01-07 MED ORDER — SODIUM CHLORIDE 0.9 % IV SOLN
350.0000 mg | Freq: Once | INTRAVENOUS | Status: AC
  Administered 2022-01-07: 350 mg via INTRAVENOUS
  Filled 2022-01-07: qty 7

## 2022-01-07 MED ORDER — SODIUM CHLORIDE 0.9 % IV SOLN: Freq: Once | INTRAVENOUS | Status: AC

## 2022-01-07 NOTE — Patient Instructions (Signed)
[facility]  Discharge Instructions: ?Thank you for choosing [facility] to provide your oncology and hematology care.  ? ?If you have a lab appointment with the [facility], please go directly to the [facility] and check in at the registration area. ?  ?Wear comfortable clothing and clothing appropriate for easy access to any Portacath or PICC line.  ? ?We strive to give you quality time with your provider. You may need to reschedule your appointment if you arrive late (15 or more minutes).  Arriving late affects you and other patients whose appointments are after yours.  Also, if you miss three or more appointments without notifying the office, you may be dismissed from the clinic at the provider?s discretion.    ?  ?For prescription refill requests, have your pharmacy contact our office and allow 72 hours for refills to be completed.   ? ?Today you received the following chemotherapy and/or immunotherapy agents: Libtayo  ?  ?To help prevent nausea and vomiting after your treatment, we encourage you to take your nausea medication as directed. ? ?BELOW ARE SYMPTOMS THAT SHOULD BE REPORTED IMMEDIATELY: ?*FEVER GREATER THAN 100.4 F (38 ?C) OR HIGHER ?*CHILLS OR SWEATING ?*NAUSEA AND VOMITING THAT IS NOT CONTROLLED WITH YOUR NAUSEA MEDICATION ?*UNUSUAL SHORTNESS OF BREATH ?*UNUSUAL BRUISING OR BLEEDING ?*URINARY PROBLEMS (pain or burning when urinating, or frequent urination) ?*BOWEL PROBLEMS (unusual diarrhea, constipation, pain near the anus) ?TENDERNESS IN MOUTH AND THROAT WITH OR WITHOUT PRESENCE OF ULCERS (sore throat, sores in mouth, or a toothache) ?UNUSUAL RASH, SWELLING OR PAIN  ?UNUSUAL VAGINAL DISCHARGE OR ITCHING  ? ?Items with * indicate a potential emergency and should be followed up as soon as possible or go to the Emergency Department if any problems should occur. ? ?Please show the CHEMOTHERAPY ALERT CARD or IMMUNOTHERAPY ALERT CARD at check-in to the  Emergency Department and triage nurse. ? ?Should you have questions after your visit or need to cancel or reschedule your appointment, please contact [facility]  Dept: [phone]  and follow the prompts.  Office hours are 8:00 a.m. to 4:30 p.m. Monday - Friday. Please note that voicemails left after 4:00 p.m. may not be returned until the following business day.  We are closed weekends and major holidays. You have access to a nurse at all times for urgent questions. Please call the main number to the clinic Dept: [phone] and follow the prompts. ? ? ?For any non-urgent questions, you may also contact your provider using MyChart. We now offer e-Visits for anyone 95 and older to request care online for non-urgent symptoms. For details visit [email or web address]. ?  ?Also download the MyChart app! Go to the app store, search "MyChart", open the app, select [facility], and log in with your MyChart username and password. ? ?Due to Covid, a mask is required upon entering the hospital/clinic. If you do not have a mask, one will be given to you upon arrival. For doctor visits, patients may have 1 support person aged 80 or older with them. For treatment visits, patients cannot have anyone with them due to current Covid guidelines and our immunocompromised population.  ? ?

## 2022-01-07 NOTE — Progress Notes (Signed)
Per Dr. Julien Nordmann OK to trt today w/ SCR 2.45 ?

## 2022-01-07 NOTE — Progress Notes (Signed)
Oncology Nurse Navigator Documentation ? ? ?  01/07/2022  ? 10:00 AM 12/26/2020  ?  9:00 AM  ?Oncology Nurse Navigator Flowsheets  ?Navigator Location [facility]  ?Navigator Encounter Type Clinic/MDC   ?Patient Visit Type MedOnc   ?Treatment Phase Treatment   ?Interventions Psycho-Social Support/I spoke with Mr. Marchio and his son in law today.  Mr. Juncaj is very nice and is doing well with tx. He has skin changes and Dr. Julien Nordmann addressed with patient.  No barrier identified at this time.    ?Acuity Level 1-No Barriers   ?Time Spent with Patient 15   ?  ?

## 2022-01-07 NOTE — Progress Notes (Signed)
?    [facility] ?[phone]   [phone] ? ?OFFICE PROGRESS NOTE ? ?[name], MD ?[address] ?[place] [address] ? 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 ? ?Biomarker Findings ?Tumor Mutational Burden - 10 Muts/Mb ?Microsatellite status - MS-Stable ?Genomic Findings ?For a complete list of the genes assayed, please refer to the Appendix. ?KRAS G12C ?NFKBIA amplification ?NKX2-1 amplification ?RBM10 D681f*80 ?7 Disease relevant genes with no reportable ?alterations: ALK, BRAF, EGFR, ERBB2, MET, RET, ROS1 ?  ?PDL1: 90% ?  ?PRIOR THERAPY:  Weekly concurrent chemoradiation with carboplatin for an AUC of 2 and paclitaxel 45 mg/m?.Marland Kitchen First dose on 12/30/2020.  Status post 6 cycles. ?  ?CURRENT THERAPY: First-line treatment with immunotherapy with Libtayo (Cempilimab) 350 Mg IV every 3 weeks.  Status post 14 cycles. ? ?INTERVAL HISTORY: ?Brian DOOLING84y.o. male returns to the clinic today for follow-up visit accompanied by his son-in-law.  The patient is feeling fine today with no concerning complaints.  He continues to have the vitiligo likely secondary to immunotherapy.  He denied having any current chest pain, shortness of breath, cough or hemoptysis.  He denied having any fever or chills.  He has no nausea, vomiting, diarrhea or constipation.  He has no headache or visual changes.  He has no significant weight loss or night sweats.  The patient is here today for evaluation before starting cycle #15 of his treatment. ? ? ?MEDICAL HISTORY: ?Past Medical History:  ?Diagnosis Date  ? Arthritis   ? Hypertension   ? Hyperthyroidism   ? lung ca 11/2020  ? ? ?ALLERGIES:  has No Known Allergies. ? ?MEDICATIONS:   ?Current Outpatient Medications  ?Medication Sig Dispense Refill  ? gabapentin (NEURONTIN) 250 MG/5ML solution Take 3 mLs (150 mg total) by mouth at bedtime. 90 mL 2  ? labetalol (NORMODYNE) 200 MG tablet Take 200 mg by mouth 2 (two) times daily.    ? levothyroxine (SYNTHROID) 200 MCG tablet Take 1 tablet (200 mcg total) by mouth daily before breakfast. 30 tablet 2  ? loperamide (IMODIUM) 2 MG capsule Take by mouth as needed for diarrhea or loose stools.    ? naproxen (NAPROSYN) 500 MG tablet Take 500 mg by mouth 2 (two) times daily as needed for moderate pain.    ? Olmesartan-Amlodipine-HCTZ 40-10-12.5 MG TABS Take 1 tablet by mouth daily.    ? oxyCODONE-acetaminophen (PERCOCET/ROXICET) 5-325 MG tablet Take 1 tablet by mouth every 8 (eight) hours as needed for severe pain. 30 tablet 0  ? prochlorperazine (COMPAZINE) 10 MG tablet Take 1 tablet (10 mg total) by mouth every 6 (six) hours as needed for nausea or vomiting. 30 tablet 2  ? tadalafil (CIALIS) 5 MG tablet Take 5 mg by mouth daily.    ? tamsulosin (FLOMAX) 0.4 MG CAPS capsule Take 0.4 mg by mouth daily.    ? ?No current facility-administered medications for this visit.  ? ? ?SURGICAL HISTORY:  ?Past Surgical History:  ?Procedure Laterality Date  ? COLONOSCOPY N/A 09/02/2016  ? Procedure: COLONOSCOPY;  Surgeon: NRogene Houston MD;  [facility];  Service: Endoscopy;  Laterality: N/A;  830  ? NO PAST SURGERIES    ? POLYPECTOMY  09/02/2016  ? Procedure:  POLYPECTOMY;  Surgeon: Rogene Houston, MD;  [facility];  Service: Endoscopy;;  ? ? ?REVIEW OF SYSTEMS:  A comprehensive review of systems was negative.  ? ?PHYSICAL EXAMINATION: General appearance: alert, cooperative, and no distress ?Head: Normocephalic, without obvious abnormality, atraumatic ?Neck: no adenopathy, no JVD, supple, symmetrical, trachea midline, and thyroid not enlarged, symmetric, no tenderness/mass/nodules ?Lymph nodes: Cervical, supraclavicular, and axillary nodes  normal. ?Resp: clear to auscultation bilaterally ?Back: symmetric, no curvature. ROM normal. No CVA tenderness. ?Cardio: regular rate and rhythm, S1, S2 normal, no murmur, click, rub or gallop ?GI: soft, non-tender; bowel sounds normal; no masses,  no organomegaly ?Extremities: extremities normal, atraumatic, no cyanosis or edema ? ?ECOG PERFORMANCE STATUS: 1 - Symptomatic but completely ambulatory ? ?Blood pressure 122/62, pulse (!) 58, temperature (!) 96.1 ?F (35.6 ?C), temperature source Tympanic, resp. rate 17, height _0  (1.88 m), weight 158 lb 14.4 oz (72.1 kg), SpO2 100 %. ? ?LABORATORY DATA: ?Lab Results  ?Component Value Date  ? WBC 3.6 (L) 01/07/2022  ? HGB 10.0 (L) 01/07/2022  ? HCT 31.0 (L) 01/07/2022  ? MCV 94.2 01/07/2022  ? PLT 174 01/07/2022  ? ? ?  Chemistry   ?   ?Component Value Date/Time  ? NA 139 12/17/2021 0920  ? K 4.6 12/17/2021 0920  ? CL 108 12/17/2021 0920  ? CO2 25 12/17/2021 0920  ? BUN 43 (H) 12/17/2021 0920  ? CREATININE 2.64 (H) 12/17/2021 0920  ?    ?Component Value Date/Time  ? CALCIUM 9.2 12/17/2021 0920  ? ALKPHOS 78 12/17/2021 0920  ? AST 14 (L) 12/17/2021 0920  ? ALT 16 12/17/2021 0920  ? BILITOT 0.4 12/17/2021 0920  ?  ? ? ? ?RADIOGRAPHIC STUDIES: ?CT Abdomen Pelvis Wo Contrast ? ?Result Date: 12/17/2021 ?CLINICAL DATA:  Primary Cancer Type: Lung * Tracking Code: BO * Imaging Indication: Assess response to therapy Interval therapy since last imaging? Yes Initial Cancer Diagnosis Date: 12/12/2020; Established by: Biopsy-proven Detailed Pathology: Stage IVA non-small cell lung cancer, adenosquamous carcinoma. Primary Tumor location: Lateral right upper lobe and right chest wall. Surgeries: No. Chemotherapy: Yes; Ongoing? No; Most recent administration: 01/20/2021 Immunotherapy?  Yes; Type: Libtayo; Ongoing? Yes Radiation therapy? Yes; Date Range: 12/30/2020 - 02/13/2021; Target: Right chest EXAM: CT CHEST, ABDOMEN AND PELVIS WITHOUT CONTRAST TECHNIQUE: Multidetector CT imaging  of the chest, abdomen and pelvis was performed following the standard protocol without IV contrast. RADIATION DOSE REDUCTION: This exam was performed according to the departmental dose-optimization program which includes automated exposure control, adjustment of the mA and/or kV according to patient size and/or use of iterative reconstruction technique. COMPARISON:  10/13/2021.  11/25/2020 PET-CT. FINDINGS: CT CHEST FINDINGS Cardiovascular: Atherosclerotic calcification of the aorta, aortic valve and coronary arteries. Enlarged pulmonic trunk. Heart is at the upper limits of normal in size to mildly enlarged. No pericardial effusion. Mediastinum/Nodes: Thyroid is enlarged and heterogeneous with a 2.3 cm low-attenuation nodule in the right. Mediastinal lymph nodes measure up to 12 mm in the AP window, similar. Hilar regions are difficult to evaluate without IV contrast. Esophagus is grossly unremarkable. Lungs/Pleura: Centrilobular and paraseptal emphysema. Post radiation masslike consolidation in the right upper lobe with pulmonary retraction, architectural distortion and bronchiectasis in the upper and medial right hemithorax. Tiny right pleural effusion, unchanged. Lungs are otherwise clear. No left pleural fluid. Narrowing of the right lower lobe bronchus, similar. Debris in the airway. Musculoskeletal: Degenerative changes in the spine. Lytic destruction of the right third anterolateral rib. Sclerosis and  old fracture of the right fourth lateral rib. No new worrisome lytic or sclerotic lesions. CT ABDOMEN PELVIS FINDINGS Hepatobiliary: Liver and gallbladder are unremarkable. No biliary ductal dilatation. Pancreas: Negative. Spleen: Vague 1.6 cm hypodense lesion in the spleen, unchanged. Adrenals/Urinary Tract: Adrenal glands are unremarkable. Kidneys appear non rotated. Low-attenuation lesions in the kidneys measure up to 3.3 cm on the left and are likely cysts. Kidneys are otherwise unremarkable. Ureters are  decompressed. Bladder wall is thickened, as before. Stomach/Bowel: Stomach and small bowel are grossly unremarkable. Appendix is poorly visualized. Cecal/ascending colonic dilatation and wall thickening with

## 2022-01-26 NOTE — Progress Notes (Deleted)
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   Biomarker Findings Tumor Mutational Burden - 10 Muts/Mb Microsatellite status - MS-Stable Genomic Findings For a complete list of the genes assayed, please refer to the Appendix. KRAS G12C NFKBIA amplification NKX2-1 amplification RBM10 D628f*80 7 Disease relevant genes with no reportable alterations: ALK, BRAF, EGFR, ERBB2, MET, RET, ROS1   PDL1: 90%  PRIOR THERAPY:  Weekly concurrent chemoradiation with carboplatin for an AUC of 2 and paclitaxel 45 mg/m.  First dose on 12/30/2020.  Status post 6 cycles  CURRENT THERAPY: First-line treatment with immunotherapy with Libtayo (Cempilimab) 350 Mg IV every 3 weeks. First dose on 03/19/21. Status post 15 cycles.  INTERVAL HISTORY: Brian MEZERA88y.[id number]. male returns to the clinic today for a follow-up visit accompanied by his son-in-law.  The patient is feeling fairly well today without any concern complaints except he has been having areas of hypopigmentation on his face for a few months but it has gotten worse recently.  The appearance is similar to vitiligo and the patient was referred to dermatology for evaluation.  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  They did not recommend any specific treatment as spontaneous vitiligo rarely resolves and there is no specific treatment to be applied.  The patient has chronic kidney disease and would likely have a challenging time with changing treatment to chemotherapy given his age and kidney dysfunction.He does not have any other signs of any other  checkpoint inhibitor toxicities.  Otherwise, he feels well. He denies any fever, chills, or night sweats.  He is followed by member the nutritionist team for his weight loss. He gained 5 lbs since his last appointment. He reports stable fatigue. He reports dyspnea on exertion which is stable. He has an intermittent cough. Denies any chest pain or hemoptysis. Denies any headache or visual changes. He is here today for evaluation and repeat blood work before his next cycle of treatment with cycle #16.  MEDICAL HISTORY: Past Medical History:  Diagnosis Date   Arthritis    Hypertension    Hyperthyroidism    lung ca 11/2020    ALLERGIES:  has No Known Allergies.  MEDICATIONS:  Current Outpatient Medications  Medication Sig Dispense Refill   gabapentin (NEURONTIN) 250 MG/5ML solution Take 3 mLs (150 mg total) by mouth at bedtime. 90 mL 2   labetalol (NORMODYNE) 200 MG tablet Take 200 mg by mouth 2 (two) times daily.     levothyroxine (SYNTHROID) 200 MCG tablet Take 1 tablet (200 mcg total) by mouth daily before breakfast. 30 tablet 2   loperamide (IMODIUM) 2 MG capsule Take by mouth as needed for diarrhea or loose stools.     naproxen (NAPROSYN) 500 MG tablet Take 500 mg by mouth 2 (two) times daily as needed for moderate pain.     Olmesartan-Amlodipine-HCTZ 40-10-12.5 MG TABS Take 1 tablet by mouth daily.     oxyCODONE-acetaminophen (PERCOCET/ROXICET) 5-325 MG tablet Take 1 tablet by mouth every 8 (eight) hours as needed for severe pain. 30 tablet 0   prochlorperazine (COMPAZINE) 10 MG tablet Take 1 tablet (10 mg total) by mouth every 6 (  six) hours as needed for nausea or vomiting. 30 tablet 2   tadalafil (CIALIS) 5 MG tablet Take 5 mg by mouth daily.     tamsulosin (FLOMAX) 0.4 MG CAPS capsule Take 0.4 mg by mouth daily.     No current facility-administered medications for this visit.    SURGICAL HISTORY:  Past Surgical History:  Procedure Laterality Date   COLONOSCOPY N/A 09/02/2016    Procedure: COLONOSCOPY;  Surgeon: Najeeb U Rehman, MD;  [facility]: [facility];  Service: Endoscopy;  Laterality: N/A;  830   NO PAST SURGERIES     POLYPECTOMY  09/02/2016   Procedure: POLYPECTOMY;  Surgeon: Najeeb U Rehman, MD;  Location: [facility];  Service: Endoscopy;;    REVIEW OF SYSTEMS:   Review of Systems  Constitutional: Negative for appetite change, chills, fatigue, fever and unexpected weight change.  HENT:   Negative for mouth sores, nosebleeds, sore throat and trouble swallowing.   Eyes: Negative for eye problems and icterus.  Respiratory: Negative for cough, hemoptysis, shortness of breath and wheezing.   Cardiovascular: Negative for chest pain and leg swelling.  Gastrointestinal: Negative for abdominal pain, constipation, diarrhea, nausea and vomiting.  Genitourinary: Negative for bladder incontinence, difficulty urinating, dysuria, frequency and hematuria.   Musculoskeletal: Negative for back pain, gait problem, neck pain and neck stiffness.  Skin: Negative for itching and rash.  Neurological: Negative for dizziness, extremity weakness, gait problem, headaches, light-headedness and seizures.  Hematological: Negative for adenopathy. Does not bruise/bleed easily.  Psychiatric/Behavioral: Negative for confusion, depression and sleep disturbance. The patient is not nervous/anxious.     PHYSICAL EXAMINATION:  There were no vitals taken for this visit.  ECOG PERFORMANCE STATUS: {CHL ONC ECOG PS:1154000200}  Physical Exam  Constitutional: Oriented to person, place, and time and well-developed, well-nourished, and in no distress. No distress.  HENT:  Head: Normocephalic and atraumatic.  Mouth/Throat: Oropharynx is clear and moist. No oropharyngeal exudate.  Eyes: Conjunctivae are normal. Right eye exhibits no discharge. Left eye exhibits no discharge. No scleral icterus.  Neck: Normal range of motion. Neck supple.  Cardiovascular: Normal rate, regular rhythm, normal  heart sounds and intact distal pulses.   Pulmonary/Chest: Effort normal and breath sounds normal. No respiratory distress. No wheezes. No rales.  Abdominal: Soft. Bowel sounds are normal. Exhibits no distension and no mass. There is no tenderness.  Musculoskeletal: Normal range of motion. Exhibits no edema.  Lymphadenopathy:    No cervical adenopathy.  Neurological: Alert and oriented to person, place, and time. Exhibits normal muscle tone. Gait normal. Coordination normal.  Skin: Skin is warm and dry. No rash noted. Not diaphoretic. No erythema. No pallor.  Psychiatric: Mood, memory and judgment normal.  Vitals reviewed.  LABORATORY DATA: Lab Results  Component Value Date   WBC 3.6 (L) 01/07/2022   HGB 10.0 (L) 01/07/2022   HCT 31.0 (L) 01/07/2022   MCV 94.2 01/07/2022   PLT 174 01/07/2022      Chemistry      Component Value Date/Time   NA 139 01/07/2022 1018   K 4.5 01/07/2022 1018   CL 110 01/07/2022 1018   CO2 24 01/07/2022 1018   BUN 38 (H) 01/07/2022 1018   CREATININE 2.45 (H) 01/07/2022 1018      Component Value Date/Time   CALCIUM 8.8 (L) 01/07/2022 1018   ALKPHOS 68 01/07/2022 1018   AST 13 (L) 01/07/2022 1018   ALT 12 01/07/2022 1018   BILITOT 0.3 01/07/2022 1018         RADIOGRAPHIC STUDIES:  No results found.   ASSESSMENT/PLAN:  This is a very pleasant 88 year old African-American male diagnosed with a stage IVa (T4, N3, M1 B) non-small cell lung cancer, adenosquamous carcinoma in March 2022 and presented with large mass involving the lateral right upper lobe and right chest wall soft tissue in addition to right hilar, mediastinal and right subpectoral and supraclavicular lymphadenopathy with PD-L1 expression of 90%.   The patient is status post a course of concurrent chemoradiation with carboplatin for an AUC of 2 and paclitaxel 45 mg per metered square.  He is status post 6 cycles.    The patient had evidence of disease progression following concurrent  chemoradiation.   The patient is currently undergoing single agent immunotherapy with Libtayo IV every 3 weeks due to his PD-L1 expression of 90%.  The patient is status post 15 cycles.  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  He was seen by Dr. Tafeen by dermatology.   The patient was seen with Dr. Mohamed. Labs were reviewed. Recommend that he *** with cycle #16 today as scheduled.    We will see him back for a follow up visit in 3 weeks for evaluation before starting cycle #17.   Scan?  TSH?   The patient was advised to call immediately if he has any concerning symptoms in the interval. The patient voices understanding of current disease status and treatment options and is in agreement with the current care plan. All questions were answered. The patient knows to call the clinic with any problems, questions or concerns. We can certainly see the patient much sooner if necessary      No orders of the defined types were placed in this encounter.    I spent {CHL ONC TIME VISIT - SWIFT:1154000130} counseling the patient face to face. The total time spent in the appointment was {CHL ONC TIME VISIT - SWIFT:1154000130}.   L , PA-C 01/26/22  

## 2022-01-27 ENCOUNTER — Telehealth: Payer: Self-pay | Admitting: Internal Medicine

## 2022-01-27 NOTE — Telephone Encounter (Signed)
Called patient multiple times regarding rescheduled upcoming appointments, left a voicemail. ?

## 2022-01-28 ENCOUNTER — Other Ambulatory Visit: Payer: Medicare HMO

## 2022-01-28 ENCOUNTER — Encounter: Payer: Self-pay | Admitting: Internal Medicine

## 2022-01-28 ENCOUNTER — Other Ambulatory Visit: Payer: Self-pay

## 2022-01-28 ENCOUNTER — Inpatient Hospital Stay: Payer: Medicare HMO

## 2022-01-28 ENCOUNTER — Ambulatory Visit: Payer: Medicare HMO | Admitting: Physician Assistant

## 2022-01-28 ENCOUNTER — Inpatient Hospital Stay: Payer: Medicare HMO | Admitting: Physician Assistant

## 2022-01-28 ENCOUNTER — Inpatient Hospital Stay (HOSPITAL_BASED_OUTPATIENT_CLINIC_OR_DEPARTMENT_OTHER): Payer: Medicare HMO | Admitting: Internal Medicine

## 2022-01-28 ENCOUNTER — Inpatient Hospital Stay: Payer: Medicare HMO | Attending: Physician Assistant

## 2022-01-28 VITALS — BP 136/74 | HR 70 | Temp 97.1°F | Resp 18 | Wt 161.2 lb

## 2022-01-28 DIAGNOSIS — C3411 Malignant neoplasm of upper lobe, right bronchus or lung: Secondary | ICD-10-CM | POA: Insufficient documentation

## 2022-01-28 DIAGNOSIS — Z5112 Encounter for antineoplastic immunotherapy: Secondary | ICD-10-CM | POA: Insufficient documentation

## 2022-01-28 DIAGNOSIS — C7951 Secondary malignant neoplasm of bone: Secondary | ICD-10-CM | POA: Insufficient documentation

## 2022-01-28 DIAGNOSIS — Z79899 Other long term (current) drug therapy: Secondary | ICD-10-CM | POA: Insufficient documentation

## 2022-01-28 LAB — CMP (CANCER CENTER ONLY)
ALT: 21 U/L (ref 0–44)
AST: 17 U/L (ref 15–41)
Albumin: 3.2 g/dL — ABNORMAL LOW (ref 3.5–5.0)
Alkaline Phosphatase: 78 U/L (ref 38–126)
Anion gap: 5 (ref 5–15)
BUN: 41 mg/dL — ABNORMAL HIGH (ref 8–23)
CO2: 25 mmol/L (ref 22–32)
Calcium: 8.7 mg/dL — ABNORMAL LOW (ref 8.9–10.3)
Chloride: 110 mmol/L (ref 98–111)
Creatinine: 2.5 mg/dL — ABNORMAL HIGH (ref 0.61–1.24)
GFR, Estimated: 24 mL/min — ABNORMAL LOW (ref >60)
Glucose, Bld: 96 mg/dL (ref 70–99)
Potassium: 4.5 mmol/L (ref 3.5–5.1)
Sodium: 140 mmol/L (ref 135–145)
Total Bilirubin: 0.4 mg/dL (ref 0.3–1.2)
Total Protein: 7.2 g/dL (ref 6.5–8.1)

## 2022-01-28 LAB — CBC WITH DIFFERENTIAL (CANCER CENTER ONLY)
Abs Immature Granulocytes: 0.01 10*3/uL (ref 0.00–0.07)
Basophils Absolute: 0 10*3/uL (ref 0.0–0.1)
Basophils Relative: 1 %
Eosinophils Absolute: 0.4 10*3/uL (ref 0.0–0.5)
Eosinophils Relative: 12 %
HCT: 32.2 % — ABNORMAL LOW (ref 39.0–52.0)
Hemoglobin: 10.1 g/dL — ABNORMAL LOW (ref 13.0–17.0)
Immature Granulocytes: 0 %
Lymphocytes Relative: 19 %
Lymphs Abs: 0.7 10*3/uL (ref 0.7–4.0)
MCH: 30.1 pg (ref 26.0–34.0)
MCHC: 31.4 g/dL (ref 30.0–36.0)
MCV: 95.8 fL (ref 80.0–100.0)
Monocytes Absolute: 0.3 10*3/uL (ref 0.1–1.0)
Monocytes Relative: 8 %
Neutro Abs: 2.1 10*3/uL (ref 1.7–7.7)
Neutrophils Relative %: 60 %
Platelet Count: 160 10*3/uL (ref 150–400)
RBC: 3.36 MIL/uL — ABNORMAL LOW (ref 4.22–5.81)
RDW: 16.2 % — ABNORMAL HIGH (ref 11.5–15.5)
WBC Count: 3.5 10*3/uL — ABNORMAL LOW (ref 4.0–10.5)
nRBC: 0 % (ref 0.0–0.2)

## 2022-01-28 LAB — TSH: TSH: 6.028 u[IU]/mL — ABNORMAL HIGH (ref 0.350–4.500)

## 2022-01-28 MED ORDER — SODIUM CHLORIDE 0.9 % IV SOLN: Freq: Once | INTRAVENOUS | Status: AC

## 2022-01-28 MED ORDER — SODIUM CHLORIDE 0.9 % IV SOLN
350.0000 mg | Freq: Once | INTRAVENOUS | Status: AC
  Administered 2022-01-28: 350 mg via INTRAVENOUS
  Filled 2022-01-28: qty 7

## 2022-01-28 NOTE — Patient Instructions (Signed)
[facility]  Discharge Instructions: ?Thank you for choosing [facility] to provide your oncology and hematology care.  ? ?If you have a lab appointment with the [facility], please go directly to the [facility] and check in at the registration area. ?  ?Wear comfortable clothing and clothing appropriate for easy access to any Portacath or PICC line.  ? ?We strive to give you quality time with your provider. You may need to reschedule your appointment if you arrive late (15 or more minutes).  Arriving late affects you and other patients whose appointments are after yours.  Also, if you miss three or more appointments without notifying the office, you may be dismissed from the clinic at the provider?s discretion.    ?  ?For prescription refill requests, have your pharmacy contact our office and allow 72 hours for refills to be completed.   ? ?Today you received the following chemotherapy and/or immunotherapy agents: Libtayo    ?  ?To help prevent nausea and vomiting after your treatment, we encourage you to take your nausea medication as directed. ? ?BELOW ARE SYMPTOMS THAT SHOULD BE REPORTED IMMEDIATELY: ?*FEVER GREATER THAN 100.4 F (38 ?C) OR HIGHER ?*CHILLS OR SWEATING ?*NAUSEA AND VOMITING THAT IS NOT CONTROLLED WITH YOUR NAUSEA MEDICATION ?*UNUSUAL SHORTNESS OF BREATH ?*UNUSUAL BRUISING OR BLEEDING ?*URINARY PROBLEMS (pain or burning when urinating, or frequent urination) ?*BOWEL PROBLEMS (unusual diarrhea, constipation, pain near the anus) ?TENDERNESS IN MOUTH AND THROAT WITH OR WITHOUT PRESENCE OF ULCERS (sore throat, sores in mouth, or a toothache) ?UNUSUAL RASH, SWELLING OR PAIN  ?UNUSUAL VAGINAL DISCHARGE OR ITCHING  ? ?Items with * indicate a potential emergency and should be followed up as soon as possible or go to the Emergency Department if any problems should occur. ? ?Please show the CHEMOTHERAPY ALERT CARD or IMMUNOTHERAPY ALERT CARD at check-in to  the Emergency Department and triage nurse. ? ?Should you have questions after your visit or need to cancel or reschedule your appointment, please contact [facility]  Dept: [phone]  and follow the prompts.  Office hours are 8:00 a.m. to 4:30 p.m. Monday - Friday. Please note that voicemails left after 4:00 p.m. may not be returned until the following business day.  We are closed weekends and major holidays. You have access to a nurse at all times for urgent questions. Please call the main number to the clinic Dept: [phone] and follow the prompts. ? ? ?For any non-urgent questions, you may also contact your provider using MyChart. We now offer e-Visits for anyone 81 and older to request care online for non-urgent symptoms. For details visit [email or web address]. ?  ?Also download the MyChart app! Go to the app store, search "MyChart", open the app, select [facility], and log in with your MyChart username and password. ? ?Due to Covid, a mask is required upon entering the hospital/clinic. If you do not have a mask, one will be given to you upon arrival. For doctor visits, patients may have 1 support person aged 9 or older with them. For treatment visits, patients cannot have anyone with them due to current Covid guidelines and our immunocompromised population.  ? ?

## 2022-01-28 NOTE — Progress Notes (Signed)
Per Dr. Julien Nordmann pt okay to treat with creatinine of 2.6 today 01/28/22. ?

## 2022-01-28 NOTE — Progress Notes (Signed)
?    [name] ?[phone]   [phone] ? ?OFFICE PROGRESS NOTE ? ?[name], MD ?[address] ?[place] [address] ? 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 ? ?Biomarker Findings ?Tumor Mutational Burden - 10 Muts/Mb ?Microsatellite status - MS-Stable ?Genomic Findings ?For a complete list of the genes assayed, please refer to the Appendix. ?KRAS G12C ?NFKBIA amplification ?NKX2-1 amplification ?RBM10 D640f*80 ?7 Disease relevant genes with no reportable ?alterations: ALK, BRAF, EGFR, ERBB2, MET, RET, ROS1 ?  ?PDL1: 90% ?  ?PRIOR THERAPY:  Weekly concurrent chemoradiation with carboplatin for an AUC of 2 and paclitaxel 45 mg/m?.Marland Kitchen First dose on 12/30/2020.  Status post 6 cycles. ?  ?CURRENT THERAPY: First-line treatment with immunotherapy with Libtayo (Cempilimab) 350 Mg IV every 3 weeks.  Status post 15 cycles. ? ?INTERVAL HISTORY: ?HCALAHAN PAK88y.o. male returns to the clinic today for follow-up visit accompanied by his son-in-law.  The patient is feeling fine today with no concerning complaints except for the skin rash changes consistent with vitiligo.  He denied having any chest pain, shortness of breath, cough or hemoptysis.  He has no nausea, vomiting, diarrhea or constipation.  He has no headache or visual changes.  He denied having any significant weight loss or night sweats.  He is here today for evaluation before starting cycle #16. ? ?MEDICAL HISTORY: ?Past Medical History:  ?Diagnosis Date  ? Arthritis   ? Hypertension   ? Hyperthyroidism   ? lung ca 11/2020  ? ? ?ALLERGIES:  has No Known Allergies. ? ?MEDICATIONS:  ?Current Outpatient Medications  ?Medication Sig Dispense Refill  ?  gabapentin (NEURONTIN) 250 MG/5ML solution Take 3 mLs (150 mg total) by mouth at bedtime. 90 mL 2  ? labetalol (NORMODYNE) 200 MG tablet Take 200 mg by mouth 2 (two) times daily.    ? levothyroxine (SYNTHROID) 200 MCG tablet Take 1 tablet (200 mcg total) by mouth daily before breakfast. 30 tablet 2  ? loperamide (IMODIUM) 2 MG capsule Take by mouth as needed for diarrhea or loose stools.    ? naproxen (NAPROSYN) 500 MG tablet Take 500 mg by mouth 2 (two) times daily as needed for moderate pain.    ? Olmesartan-Amlodipine-HCTZ 40-10-12.5 MG TABS Take 1 tablet by mouth daily.    ? oxyCODONE-acetaminophen (PERCOCET/ROXICET) 5-325 MG tablet Take 1 tablet by mouth every 8 (eight) hours as needed for severe pain. 30 tablet 0  ? prochlorperazine (COMPAZINE) 10 MG tablet Take 1 tablet (10 mg total) by mouth every 6 (six) hours as needed for nausea or vomiting. 30 tablet 2  ? tadalafil (CIALIS) 5 MG tablet Take 5 mg by mouth daily.    ? tamsulosin (FLOMAX) 0.4 MG CAPS capsule Take 0.4 mg by mouth daily.    ? ?No current facility-administered medications for this visit.  ? ? ?SURGICAL HISTORY:  ?Past Surgical History:  ?Procedure Laterality Date  ? COLONOSCOPY N/A 09/02/2016  ? Procedure: COLONOSCOPY;  Surgeon: NRogene Houston MD;  [facility];  Service: Endoscopy;  Laterality: N/A;  830  ? NO PAST SURGERIES    ? POLYPECTOMY  09/02/2016  ? Procedure: POLYPECTOMY;  Surgeon: NRogene Houston MD;  [facility];  Service: Endoscopy;;  ? ? ?  REVIEW OF SYSTEMS:  A comprehensive review of systems was negative.  ? ?PHYSICAL EXAMINATION: General appearance: alert, cooperative, and no distress ?Head: Normocephalic, without obvious abnormality, atraumatic ?Neck: no adenopathy, no JVD, supple, symmetrical, trachea midline, and thyroid not enlarged, symmetric, no tenderness/mass/nodules ?Lymph nodes: Cervical, supraclavicular, and axillary nodes normal. ?Resp: clear to auscultation bilaterally ?Back: symmetric, no  curvature. ROM normal. No CVA tenderness. ?Cardio: regular rate and rhythm, S1, S2 normal, no murmur, click, rub or gallop ?GI: soft, non-tender; bowel sounds normal; no masses,  no organomegaly ?Extremities: extremities normal, atraumatic, no cyanosis or edema ? ?ECOG PERFORMANCE STATUS: 1 - Symptomatic but completely ambulatory ? ?Blood pressure 136/74, pulse 70, temperature (!) 97.1 ?F (36.2 ?C), temperature source Tympanic, resp. rate 18, weight 161 lb 3 oz (73.1 kg), SpO2 92 %. ? ?LABORATORY DATA: ?Lab Results  ?Component Value Date  ? WBC 3.5 (L) 01/28/2022  ? HGB 10.1 (L) 01/28/2022  ? HCT 32.2 (L) 01/28/2022  ? MCV 95.8 01/28/2022  ? PLT 160 01/28/2022  ? ? ?  Chemistry   ?   ?Component Value Date/Time  ? NA 139 01/07/2022 1018  ? K 4.5 01/07/2022 1018  ? CL 110 01/07/2022 1018  ? CO2 24 01/07/2022 1018  ? BUN 38 (H) 01/07/2022 1018  ? CREATININE 2.45 (H) 01/07/2022 1018  ?    ?Component Value Date/Time  ? CALCIUM 8.8 (L) 01/07/2022 1018  ? ALKPHOS 68 01/07/2022 1018  ? AST 13 (L) 01/07/2022 1018  ? ALT 12 01/07/2022 1018  ? BILITOT 0.3 01/07/2022 1018  ?  ? ? ? ?RADIOGRAPHIC STUDIES: ?No results found. ? ?ASSESSMENT AND PLAN: This is a very pleasant 88 years old African-American male recently diagnosed with a stage IVA (T4, N3, M1b) non-small cell lung cancer, adenosquamous carcinoma in March 2022 and presented with large mass involving the lateral right upper lobe and right chest wall soft tissue in addition to right hilar, mediastinal and right subpectoral and supraclavicular lymphadenopathy with PD-L1 expression of 90%.  The patient underwent a course of concurrent chemoradiation with weekly carboplatin for AUC of 2 and paclitaxel 45 Mg/M2 status post 6 cycles. ?He tolerated the previous course of his concurrent chemoradiation fairly well except for fatigue. ?Unfortunately his CT scan of the chest after the induction phase showed interval enlargement of a large mass centered at the periphery of the right  upper lobe and extending into the adjacent chest wall measuring 8.4 x 6.7 cm with bony destruction of the right third and fourth ribs substantially increased compared to the prior examination and the findings are consistent with worsened malignancy. ?The patient has PD-L1 expression of 90% and I felt he will be a good candidate for treatment with first-line single agent immunotherapy. ?I recommended for him treatment with Libtayo (Cempilimab) 350 Mg IV every 3 weeks.  He is status post 15 cycles of treatment. ?The patient has been doing fine with no concerning complaints. ?I recommended for him to proceed with cycle #16 today as planned. ?I will see him back for follow-up visit in 3 weeks for evaluation before starting cycle #17.  I will consider repeating his imaging studies after the next cycle of his treatment. ?He was advised to call immediately if he has any other concerning symptoms in the interval. ?The patient voices understanding of current disease status and treatment options and is in agreement with the current care plan. ? ?All questions were answered. The patient knows to call the clinic with any problems, questions or  concerns. We can certainly see the patient much sooner if necessary. ? ? ?Disclaimer: This note was dictated with voice recognition software. Similar sounding words can inadvertently be transcribed and may not be corrected upon review. ? ? ?  ?  ?

## 2022-02-16 DIAGNOSIS — N184 Chronic kidney disease, stage 4 (severe): Secondary | ICD-10-CM | POA: Diagnosis not present

## 2022-02-16 DIAGNOSIS — C3411 Malignant neoplasm of upper lobe, right bronchus or lung: Secondary | ICD-10-CM | POA: Diagnosis not present

## 2022-02-16 DIAGNOSIS — N400 Enlarged prostate without lower urinary tract symptoms: Secondary | ICD-10-CM | POA: Diagnosis not present

## 2022-02-16 DIAGNOSIS — I129 Hypertensive chronic kidney disease with stage 1 through stage 4 chronic kidney disease, or unspecified chronic kidney disease: Secondary | ICD-10-CM | POA: Diagnosis not present

## 2022-02-16 DIAGNOSIS — D631 Anemia in chronic kidney disease: Secondary | ICD-10-CM | POA: Diagnosis not present

## 2022-02-16 DIAGNOSIS — M1990 Unspecified osteoarthritis, unspecified site: Secondary | ICD-10-CM | POA: Diagnosis not present

## 2022-02-18 ENCOUNTER — Inpatient Hospital Stay: Payer: Medicare HMO

## 2022-02-18 ENCOUNTER — Other Ambulatory Visit: Payer: Medicare HMO

## 2022-02-18 ENCOUNTER — Other Ambulatory Visit: Payer: Self-pay

## 2022-02-18 ENCOUNTER — Inpatient Hospital Stay (HOSPITAL_BASED_OUTPATIENT_CLINIC_OR_DEPARTMENT_OTHER): Payer: Medicare HMO | Admitting: Internal Medicine

## 2022-02-18 ENCOUNTER — Encounter: Payer: Self-pay | Admitting: Internal Medicine

## 2022-02-18 VITALS — BP 114/62 | HR 59 | Temp 96.9°F | Resp 18 | Wt 157.0 lb

## 2022-02-18 DIAGNOSIS — Z5112 Encounter for antineoplastic immunotherapy: Secondary | ICD-10-CM | POA: Diagnosis not present

## 2022-02-18 DIAGNOSIS — C3411 Malignant neoplasm of upper lobe, right bronchus or lung: Secondary | ICD-10-CM | POA: Diagnosis not present

## 2022-02-18 DIAGNOSIS — C3490 Malignant neoplasm of unspecified part of unspecified bronchus or lung: Secondary | ICD-10-CM

## 2022-02-18 DIAGNOSIS — Z79899 Other long term (current) drug therapy: Secondary | ICD-10-CM | POA: Diagnosis not present

## 2022-02-18 DIAGNOSIS — C7951 Secondary malignant neoplasm of bone: Secondary | ICD-10-CM | POA: Diagnosis not present

## 2022-02-18 LAB — CBC WITH DIFFERENTIAL (CANCER CENTER ONLY)
Abs Immature Granulocytes: 0.01 10*3/uL (ref 0.00–0.07)
Basophils Absolute: 0 10*3/uL (ref 0.0–0.1)
Basophils Relative: 0 %
Eosinophils Absolute: 0.2 10*3/uL (ref 0.0–0.5)
Eosinophils Relative: 5 %
HCT: 31 % — ABNORMAL LOW (ref 39.0–52.0)
Hemoglobin: 10.3 g/dL — ABNORMAL LOW (ref 13.0–17.0)
Immature Granulocytes: 0 %
Lymphocytes Relative: 26 %
Lymphs Abs: 0.8 10*3/uL (ref 0.7–4.0)
MCH: 31.1 pg (ref 26.0–34.0)
MCHC: 33.2 g/dL (ref 30.0–36.0)
MCV: 93.7 fL (ref 80.0–100.0)
Monocytes Absolute: 0.3 10*3/uL (ref 0.1–1.0)
Monocytes Relative: 9 %
Neutro Abs: 1.9 10*3/uL (ref 1.7–7.7)
Neutrophils Relative %: 60 %
Platelet Count: 152 10*3/uL (ref 150–400)
RBC: 3.31 MIL/uL — ABNORMAL LOW (ref 4.22–5.81)
RDW: 15.5 % (ref 11.5–15.5)
WBC Count: 3.1 10*3/uL — ABNORMAL LOW (ref 4.0–10.5)
nRBC: 0 % (ref 0.0–0.2)

## 2022-02-18 LAB — TSH: TSH: 3.443 u[IU]/mL (ref 0.350–4.500)

## 2022-02-18 LAB — CMP (CANCER CENTER ONLY)
ALT: 14 U/L (ref 0–44)
AST: 14 U/L — ABNORMAL LOW (ref 15–41)
Albumin: 3.3 g/dL — ABNORMAL LOW (ref 3.5–5.0)
Alkaline Phosphatase: 73 U/L (ref 38–126)
Anion gap: 4 — ABNORMAL LOW (ref 5–15)
BUN: 36 mg/dL — ABNORMAL HIGH (ref 8–23)
CO2: 24 mmol/L (ref 22–32)
Calcium: 8.6 mg/dL — ABNORMAL LOW (ref 8.9–10.3)
Chloride: 109 mmol/L (ref 98–111)
Creatinine: 2.32 mg/dL — ABNORMAL HIGH (ref 0.61–1.24)
GFR, Estimated: 27 mL/min — ABNORMAL LOW (ref >60)
Glucose, Bld: 107 mg/dL — ABNORMAL HIGH (ref 70–99)
Potassium: 4.4 mmol/L (ref 3.5–5.1)
Sodium: 137 mmol/L (ref 135–145)
Total Bilirubin: 0.4 mg/dL (ref 0.3–1.2)
Total Protein: 7.2 g/dL (ref 6.5–8.1)

## 2022-02-18 MED ORDER — SODIUM CHLORIDE 0.9 % IV SOLN
350.0000 mg | Freq: Once | INTRAVENOUS | Status: AC
  Administered 2022-02-18: 350 mg via INTRAVENOUS
  Filled 2022-02-18: qty 7

## 2022-02-18 MED ORDER — SODIUM CHLORIDE 0.9 % IV SOLN: Freq: Once | INTRAVENOUS | Status: AC

## 2022-02-18 NOTE — Progress Notes (Signed)
Per Dr. Julien Nordmann , it is ok to treat pt today with cimiplimab creatinine of 2.3 .

## 2022-02-18 NOTE — Progress Notes (Signed)
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  Biomarker Findings Tumor Mutational Burden - 10 Muts/Mb Microsatellite status - MS-Stable Genomic Findings For a complete list of the genes assayed, please refer to the Appendix. KRAS G12C NFKBIA amplification NKX2-1 amplification RBM10 D653f*80 7 Disease relevant genes with no reportable alterations: ALK, BRAF, EGFR, ERBB2, MET, RET, ROS1   PDL1: 90%   PRIOR THERAPY:  Weekly concurrent chemoradiation with carboplatin for an AUC of 2 and paclitaxel 45 mg/m.  First dose on 12/30/2020.  Status post 6 cycles.   CURRENT THERAPY: First-line treatment with immunotherapy with Libtayo (Cempilimab) 350 Mg IV every 3 weeks.  Status post 16 cycles.  INTERVAL HISTORY: Brian HOSHINO88y.[email or web address]. male returns to the clinic today for follow-up visit.  He was accompanied by his son-in-law.  The patient is feeling fine today with no concerning complaints except for the arthralgia of his knees.  He denied having any chest pain, shortness of breath, cough or hemoptysis.  He denied having any nausea, vomiting, diarrhea or constipation.  He has no headache or visual changes.  He denied having any recent weight loss or night sweats.  He continues to tolerate his treatment with immunotherapy with Libtayo (Cempilimab) fairly well.  The patient is here today for evaluation before starting cycle #17.  MEDICAL HISTORY: Past Medical History:  Diagnosis Date   Arthritis    Hypertension    Hyperthyroidism    lung ca 11/2020    ALLERGIES:  has No Known  Allergies.  MEDICATIONS:  Current Outpatient Medications  Medication Sig Dispense Refill   amLODipine (NORVASC) 10 MG tablet Take 10 mg by mouth daily.     gabapentin (NEURONTIN) 250 MG/5ML solution Take 3 mLs (150 mg total) by mouth at bedtime. 90 mL 2   hydrochlorothiazide (MICROZIDE) 12.5 MG capsule Take 12.5 mg by mouth daily.     labetalol (NORMODYNE) 200 MG tablet Take 200 mg by mouth 2 (two) times daily.     levothyroxine (SYNTHROID) 200 MCG tablet Take 1 tablet (200 mcg total) by mouth daily before breakfast. 30 tablet 2   loperamide (IMODIUM) 2 MG capsule Take by mouth as needed for diarrhea or loose stools.     naproxen (NAPROSYN) 500 MG tablet Take 500 mg by mouth 2 (two) times daily as needed for moderate pain.     oxyCODONE-acetaminophen (PERCOCET/ROXICET) 5-325 MG tablet Take 1 tablet by mouth every 8 (eight) hours as needed for severe pain. 30 tablet 0   prochlorperazine (COMPAZINE) 10 MG tablet Take 1 tablet (10 mg total) by mouth every 6 (six) hours as needed for nausea or vomiting. 30 tablet 2   tadalafil (CIALIS) 5 MG tablet Take 5 mg by mouth daily.     tamsulosin (FLOMAX) 0.4 MG CAPS capsule Take 0.4 mg by mouth daily.     No current facility-administered medications for this visit.    SURGICAL HISTORY:  Past Surgical History:  Procedure Laterality Date   COLONOSCOPY N/A 09/02/2016   Procedure: COLONOSCOPY;  Surgeon: NRogene Houston MD;  Location: [facility];  Service: Endoscopy;  Laterality:  N/A;  830   NO PAST SURGERIES     POLYPECTOMY  09/02/2016   Procedure: POLYPECTOMY;  Surgeon: Rogene Houston, MD;  Location: [facility];  Service: Endoscopy;;    REVIEW OF SYSTEMS:  A comprehensive review of systems was negative except for: Musculoskeletal: positive for arthralgias   PHYSICAL EXAMINATION: General appearance: alert, cooperative, and no distress Head: Normocephalic, without obvious abnormality, atraumatic Neck: no adenopathy, no JVD, supple,  symmetrical, trachea midline, and thyroid not enlarged, symmetric, no tenderness/mass/nodules Lymph nodes: Cervical, supraclavicular, and axillary nodes normal. Resp: clear to auscultation bilaterally Back: symmetric, no curvature. ROM normal. No CVA tenderness. Cardio: regular rate and rhythm, S1, S2 normal, no murmur, click, rub or gallop GI: soft, non-tender; bowel sounds normal; no masses,  no organomegaly Extremities: extremities normal, atraumatic, no cyanosis or edema  ECOG PERFORMANCE STATUS: 1 - Symptomatic but completely ambulatory  Blood pressure 114/62, pulse (!) 59, temperature (!) 96.9 F (36.1 C), temperature source Tympanic, resp. rate 18, weight 157 lb (71.2 kg), SpO2 96 %.  LABORATORY DATA: Lab Results  Component Value Date   WBC 3.1 (L) 02/18/2022   HGB 10.3 (L) 02/18/2022   HCT 31.0 (L) 02/18/2022   MCV 93.7 02/18/2022   PLT 152 02/18/2022      Chemistry      Component Value Date/Time   NA 137 02/18/2022 0948   K 4.4 02/18/2022 0948   CL 109 02/18/2022 0948   CO2 24 02/18/2022 0948   BUN 36 (H) 02/18/2022 0948   CREATININE 2.32 (H) 02/18/2022 0948      Component Value Date/Time   CALCIUM 8.6 (L) 02/18/2022 0948   ALKPHOS 73 02/18/2022 0948   AST 14 (L) 02/18/2022 0948   ALT 14 02/18/2022 0948   BILITOT 0.4 02/18/2022 0948       RADIOGRAPHIC STUDIES: No results found.  ASSESSMENT AND PLAN: This is a very pleasant 88 years old African-American male recently diagnosed with a stage IVA (T4, N3, M1b) non-small cell lung cancer, adenosquamous carcinoma in March 2022 and presented with large mass involving the lateral right upper lobe and right chest wall soft tissue in addition to right hilar, mediastinal and right subpectoral and supraclavicular lymphadenopathy with PD-L1 expression of 90%.  The patient underwent a course of concurrent chemoradiation with weekly carboplatin for AUC of 2 and paclitaxel 45 Mg/M2 status post 6 cycles. He tolerated the  previous course of his concurrent chemoradiation fairly well except for fatigue. Unfortunately his CT scan of the chest after the induction phase showed interval enlargement of a large mass centered at the periphery of the right upper lobe and extending into the adjacent chest wall measuring 8.4 x 6.7 cm with bony destruction of the right third and fourth ribs substantially increased compared to the prior examination and the findings are consistent with worsened malignancy. The patient has PD-L1 expression of 90% and I felt he will be a good candidate for treatment with first-line single agent immunotherapy. I recommended for him treatment with Libtayo (Cempilimab) 350 Mg IV every 3 weeks.  He is status post 16 cycles of treatment. The patient has been tolerating this treatment well with no concerning adverse effects. I recommended for him to proceed with cycle #17 today. I will see him back for follow-up visit in 3 weeks for evaluation with repeat CT scan of the chest, abdomen and pelvis for restaging of his disease. The patient was advised to call immediately if he has any other concerning symptoms in  the interval. The patient voices understanding of current disease status and treatment options and is in agreement with the current care plan.  All questions were answered. The patient knows to call the clinic with any problems, questions or concerns. We can certainly see the patient much sooner if necessary.   Disclaimer: This note was dictated with voice recognition software. Similar sounding words can inadvertently be transcribed and may not be corrected upon review.

## 2022-02-18 NOTE — Patient Instructions (Signed)
[facility]  Discharge Instructions: Thank you for choosing [facility] to provide your oncology and hematology care.   If you have a lab appointment with the [facility], please go directly to the [facility] and check in at the registration area.   Wear comfortable clothing and clothing appropriate for easy access to any Portacath or PICC line.   We strive to give you quality time with your provider. You may need to reschedule your appointment if you arrive late (15 or more minutes).  Arriving late affects you and other patients whose appointments are after yours.  Also, if you miss three or more appointments without notifying the office, you may be dismissed from the clinic at the provider's discretion.      For prescription refill requests, have your pharmacy contact our office and allow 72 hours for refills to be completed.    Today you received the following chemotherapy and/or immunotherapy agents: Libtayo      To help prevent nausea and vomiting after your treatment, we encourage you to take your nausea medication as directed.  BELOW ARE SYMPTOMS THAT SHOULD BE REPORTED IMMEDIATELY: *FEVER GREATER THAN 100.4 F (38 C) OR HIGHER *CHILLS OR SWEATING *NAUSEA AND VOMITING THAT IS NOT CONTROLLED WITH YOUR NAUSEA MEDICATION *UNUSUAL SHORTNESS OF BREATH *UNUSUAL BRUISING OR BLEEDING *URINARY PROBLEMS (pain or burning when urinating, or frequent urination) *BOWEL PROBLEMS (unusual diarrhea, constipation, pain near the anus) TENDERNESS IN MOUTH AND THROAT WITH OR WITHOUT PRESENCE OF ULCERS (sore throat, sores in mouth, or a toothache) UNUSUAL RASH, SWELLING OR PAIN  UNUSUAL VAGINAL DISCHARGE OR ITCHING   Items with * indicate a potential emergency and should be followed up as soon as possible or go to the Emergency Department if any problems should occur.  Please show the CHEMOTHERAPY ALERT CARD or IMMUNOTHERAPY ALERT CARD at check-in to  the Emergency Department and triage nurse.  Should you have questions after your visit or need to cancel or reschedule your appointment, please contact [facility]  Dept: [phone]  and follow the prompts.  Office hours are 8:00 a.m. to 4:30 p.m. Monday - Friday. Please note that voicemails left after 4:00 p.m. may not be returned until the following business day.  We are closed weekends and major holidays. You have access to a nurse at all times for urgent questions. Please call the main number to the clinic Dept: [phone] and follow the prompts.   For any non-urgent questions, you may also contact your provider using MyChart. We now offer e-Visits for anyone 56 and older to request care online for non-urgent symptoms. For details visit [email or web address].   Also download the MyChart app! Go to the app store, search "MyChart", open the app, select [facility], and log in with your MyChart username and password.  Due to Covid, a mask is required upon entering the hospital/clinic. If you do not have a mask, one will be given to you upon arrival. For doctor visits, patients may have 1 support person aged 79 or older with them. For treatment visits, patients cannot have anyone with them due to current Covid guidelines and our immunocompromised population.

## 2022-03-04 NOTE — Progress Notes (Signed)
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   Biomarker Findings Tumor Mutational Burden - 10 Muts/Mb Microsatellite status - MS-Stable Genomic Findings For a complete list of the genes assayed, please refer to the Appendix. KRAS G12C NFKBIA amplification NKX2-1 amplification RBM10 [id number]*80 7 Disease relevant genes with no reportable alterations: ALK, BRAF, EGFR, ERBB2, MET, RET, ROS1   PDL1: 90%  PRIOR THERAPY: Weekly concurrent chemoradiation with carboplatin for an AUC of 2 and paclitaxel 45 mg/m.  First dose on 12/30/2020.  Status post 6 cycles  CURRENT THERAPY: First-line treatment with immunotherapy with Libtayo (Cempilimab) 350 Mg IV every 3 weeks. First dose on 03/19/21. Status post 17 cycles.  INTERVAL HISTORY: HDOY [id number]. male returns to the clinic today for a follow-up visit accompanied by his son-in-law.  The patient is feeling fairly well today without any concern complaints. He is tolerating his treatment well except he developed immunotherapy mediated vitiligo, which is getting worse. He also has arthritis in his left knee. Otherwise, he feels well. He denies any fever, chills, or night sweats.  He is followed by member the nutritionist team for his weight loss. He lost 2 lbs since his last appointment. He reports stable fatigue. He reports dyspnea on exertion which is stable. He denies significant cough. Denies any chest pain or hemoptysis. Denies any headache or visual changes.  The patient recently had a restaging CT scan performed.  He is here today for evaluation and repeat blood work before his  next cycle of treatment with cycle #18.     MEDICAL HISTORY: Past Medical History:  Diagnosis Date   Arthritis    Hypertension    Hyperthyroidism    lung ca 11/2020    ALLERGIES:  has No Known Allergies.  MEDICATIONS:  Current Outpatient Medications  Medication Sig Dispense Refill   amLODipine (NORVASC) 10 MG tablet Take 10 mg by mouth daily.     gabapentin (NEURONTIN) 250 MG/5ML solution Take 3 mLs (150 mg total) by mouth at bedtime. 90 mL 2   hydrochlorothiazide (MICROZIDE) 12.5 MG capsule Take 12.5 mg by mouth daily.     labetalol (NORMODYNE) 200 MG tablet Take 200 mg by mouth 2 (two) times daily.     levothyroxine (SYNTHROID) 200 MCG tablet Take 1 tablet (200 mcg total) by mouth daily before breakfast. 30 tablet 2   loperamide (IMODIUM) 2 MG capsule Take by mouth as needed for diarrhea or loose stools.     naproxen (NAPROSYN) 500 MG tablet Take 500 mg by mouth 2 (two) times daily as needed for moderate pain.     oxyCODONE-acetaminophen (PERCOCET/ROXICET) 5-325 MG tablet Take 1 tablet by mouth every 8 (eight) hours as needed for severe pain. 30 tablet 0   prochlorperazine (COMPAZINE) 10 MG tablet Take 1 tablet (10 mg total) by mouth every 6 (six) hours as needed for nausea or vomiting. 30 tablet 2   tadalafil (CIALIS) 5 MG tablet Take 5 mg by mouth daily.     tamsulosin (FLOMAX) 0.4 MG CAPS capsule Take 0.4 mg by mouth daily.     No current facility-administered medications for this visit.    SURGICAL HISTORY:  Past Surgical History:  Procedure Laterality Date   COLONOSCOPY N/A 09/02/2016   Procedure: COLONOSCOPY;  Surgeon: Rogene Houston, MD;  [facility]: [facility];  Service: Endoscopy;  Laterality: N/A;  830   NO PAST SURGERIES     POLYPECTOMY  09/02/2016   Procedure: POLYPECTOMY;  Surgeon: Rogene Houston, MD;  Location: [facility];  Service: Endoscopy;;    REVIEW OF SYSTEMS:   Constitutional: Positive for stable fatigue.  Positive for weight loss.  Negative for  chills, decrease appetite, and fever. HENT:  Negative for mouth sores, nosebleeds, or sore throat. Eyes: Negative for eye problems and icterus.  Respiratory: Positive for stable shortness of breath with exertion. Negative for hemoptysis, cough and wheezing.  Cardiovascular: Negative for chest pain and leg swelling.  Gastrointestinal: Negative for abdominal pain, nausea, vomiting, and diarrhea, and constipation. Genitourinary: Negative for bladder incontinence, difficulty urinating, dysuria, frequency and hematuria.   Musculoskeletal: Negative for back pain, gait problem, neck pain and neck stiffness.  Skin: Positive for hypopigmentation on face/vitiligo .  Neurological: Negative for dizziness, extremity weakness, gait problem, headaches, light-headedness and seizures.  Hematological: Negative for adenopathy. Does not bruise/bleed easily.  Psychiatric/Behavioral: Negative for confusion, depression and sleep disturbance. The patient is not nervous/anxious.        PHYSICAL EXAMINATION:  Blood pressure 123/65, pulse 63, temperature 98.1 F (36.7 C), temperature source Oral, resp. rate 18, height _0  (1.88 m), weight 155 lb 4.8 oz (70.4 kg), SpO2 95 %.  ECOG PERFORMANCE STATUS: 1-2  Physical Exam  Constitutional: Oriented to person, place, and time and thin appearing male and in no distress.  HENT:  Head: Normocephalic and atraumatic.  Mouth/Throat: Oropharynx is clear and moist. No oropharyngeal exudate.  No evidence of thrush Eyes: Conjunctivae are normal. Right eye exhibits no discharge. Left eye exhibits no discharge. No scleral icterus.  Neck: Normal range of motion. Neck supple.  Cardiovascular: Normal rate, regular rhythm, normal heart sounds and intact distal pulses.   Pulmonary/Chest: Effort normal and breath sounds normal. No respiratory distress. No wheezes. No rales.  Abdominal: Soft. Bowel sounds are normal. Exhibits no distension and no mass. There is no tenderness.   Musculoskeletal: Normal range of motion. Exhibits no edema.  Lymphadenopathy:    No cervical adenopathy.  Neurological: Alert and oriented to person, place, and time. Exhibits muscle wasting.  The patient was examined in the wheelchair. Skin: Hypopigmentation on his face and upper torso and hands. Skin is warm and dry. Not diaphoretic. No erythema. No pallor.  Psychiatric: Mood, memory and judgment normal.  Vitals reviewed.  LABORATORY DATA: Lab Results  Component Value Date   WBC 3.1 (L) 03/11/2022   HGB 10.1 (L) 03/11/2022   HCT 30.4 (L) 03/11/2022   MCV 92.7 03/11/2022   PLT 184 03/11/2022      Chemistry      Component Value Date/Time   NA 139 03/11/2022 1002   K 4.2 03/11/2022 1002   CL 108 03/11/2022 1002   CO2 26 03/11/2022 1002   BUN 33 (H) 03/11/2022 1002   CREATININE 2.65 (H) 03/11/2022 1002      Component Value Date/Time   CALCIUM 8.9 03/11/2022 1002   ALKPHOS 67 03/11/2022 1002   AST 9 (L) 03/11/2022 1002   ALT 9 03/11/2022 1002   BILITOT 0.3 03/11/2022 1002       RADIOGRAPHIC STUDIES:  CT Chest Wo Contrast  Result Date: 03/10/2022 CLINICAL DATA:  Primary Cancer Type: Lung Imaging Indication: Assess response to  therapy Interval therapy since last imaging? Yes Initial Cancer Diagnosis Date: 12/12/2020; Established by: Biopsy-proven Detailed Pathology: Stage IVA non-small cell lung cancer, adenosquamous carcinoma. Primary Tumor location: Lateral right upper lobe and right chest wall. Surgeries: No. Chemotherapy: Yes; Ongoing? No; Most recent administration: 01/20/2021 Immunotherapy?  Yes; Type: Libtayo; Ongoing? Yes Radiation therapy? Yes; Date Range: 12/30/2020 - 02/13/2021; Target: Right chest. * Tracking Code: BO * EXAM: CT CHEST, ABDOMEN AND PELVIS WITHOUT CONTRAST TECHNIQUE: Multidetector CT imaging of the chest, abdomen and pelvis was performed following the standard protocol without IV contrast. RADIATION DOSE REDUCTION: This exam was performed according to  the departmental dose-optimization program which includes automated exposure control, adjustment of the mA and/or kV according to patient size and/or use of iterative reconstruction technique. COMPARISON:  Most recent CT chest, abdomen and pelvis 12/16/2021. 11/25/2020 PET-CT. FINDINGS: CT CHEST FINDINGS Cardiovascular: Coronary, aortic arch, and branch vessel atherosclerotic vascular disease. Mediastinum/Nodes: Hypodense left posterior thyroid nodule 2.2 cm in long axis on image 13 series 2, previously measured 2.3 cm. Multinodular and lobular enlarged thyroid gland. AP window lymph node 1.0 cm in short axis on image 25 series 2, previously measured at 1.2 cm. Lungs/Pleura: Severe centrilobular emphysema. Stable peripheral scarring and volume loss in the right lung laterally and posteriorly. Stable confluent bronchiectasis and scarring anteriorly in the right upper lobe. Stable right lower lobe perihilar radiation therapy related findings and scarring. Stable scarring in the posterior basal segment right lower lobe. No new or progressive lung findings. Musculoskeletal: Stable absent segment of the right third rib potentially related to bony destruction. Scalloping of portion of the right fourth rib is unchanged on image 47 series 4. CT ABDOMEN PELVIS FINDINGS Hepatobiliary: Unremarkable Pancreas: Unremarkable Spleen: 2.3 cm hypodense lesion in the lateral spleen is not substantially changed. Adrenals/Urinary Tract: Bilateral renal fluid density lesions favoring benign cysts. Some of the hypodense lesions are technically too small to characterize. Exaggerated anterior-posterior long axis of the right kidney, as before. Substantial diffuse urinary bladder wall thickening, possibly related to bladder outlet obstruction or cystitis. Adrenal glands unremarkable. Stomach/Bowel: Previous cecal and ascending colon wall thickening has resolved. Scattered colonic diverticula. Mild prominence of gas and stool in the distal  colon. No dilated small bowel. Vascular/Lymphatic: Atherosclerosis is present, including aortoiliac atherosclerotic disease. No pathologic adenopathy identified in the abdomen/pelvis. Reproductive: Mild prostatomegaly. Fiducial markers noted along the prostate gland. Other: No supplemental non-categorized findings. Musculoskeletal: Degenerative arthropathy of the hips, right greater than left. Lumbar spondylosis and degenerative disc disease causing substantial impingement at all levels between L1 and S1. Grade 1 degenerative retrolisthesis at L1-2. Prominent loss of intervertebral disc height at L5-S1. IMPRESSION: 1. Stable post therapy related findings in the right lung without findings of progression or new lesion. 2. Stable nonspecific hypodense splenic lesion. 3. Stable lobular enlargement of the thyroid gland which has an index 2.2 cm in long axis nodule of the right lobe. In the setting of significant comorbidities or limited life expectancy, no follow-up recommended (ref: J Am Coll Radiol. 2015 Feb;12(2): 143-50). 4. Stable appearance of the absent segment of the right third rib and scalloping of the right fourth rib compatible with prior lytic destruction. 5. Previous wall thickening in the proximal colon has resolved. 6. Urinary bladder wall thickening, possibly from cystitis and/or bladder outlet obstruction. 7. Other imaging findings of potential clinical significance: Aortic Atherosclerosis (ICD10-I70.0) and Emphysema (ICD10-J43.9). Coronary atherosclerosis. Systemic atherosclerosis. Bilateral likely benign renal cysts. Prostatomegaly. Impingement at all lumbar levels. Degenerative hip arthropathy bilaterally. Electronically  Signed   By: Van Clines M.D.   On: 03/10/2022 11:14   CT Abdomen Pelvis Wo Contrast  Result Date: 03/10/2022 CLINICAL DATA:  Primary Cancer Type: Lung Imaging Indication: Assess response to therapy Interval therapy since last imaging? Yes Initial Cancer Diagnosis Date:  12/12/2020; Established by: Biopsy-proven Detailed Pathology: Stage IVA non-small cell lung cancer, adenosquamous carcinoma. Primary Tumor location: Lateral right upper lobe and right chest wall. Surgeries: No. Chemotherapy: Yes; Ongoing? No; Most recent administration: 01/20/2021 Immunotherapy?  Yes; Type: Libtayo; Ongoing? Yes Radiation therapy? Yes; Date Range: 12/30/2020 - 02/13/2021; Target: Right chest. * Tracking Code: BO * EXAM: CT CHEST, ABDOMEN AND PELVIS WITHOUT CONTRAST TECHNIQUE: Multidetector CT imaging of the chest, abdomen and pelvis was performed following the standard protocol without IV contrast. RADIATION DOSE REDUCTION: This exam was performed according to the departmental dose-optimization program which includes automated exposure control, adjustment of the mA and/or kV according to patient size and/or use of iterative reconstruction technique. COMPARISON:  Most recent CT chest, abdomen and pelvis 12/16/2021. 11/25/2020 PET-CT. FINDINGS: CT CHEST FINDINGS Cardiovascular: Coronary, aortic arch, and branch vessel atherosclerotic vascular disease. Mediastinum/Nodes: Hypodense left posterior thyroid nodule 2.2 cm in long axis on image 13 series 2, previously measured 2.3 cm. Multinodular and lobular enlarged thyroid gland. AP window lymph node 1.0 cm in short axis on image 25 series 2, previously measured at 1.2 cm. Lungs/Pleura: Severe centrilobular emphysema. Stable peripheral scarring and volume loss in the right lung laterally and posteriorly. Stable confluent bronchiectasis and scarring anteriorly in the right upper lobe. Stable right lower lobe perihilar radiation therapy related findings and scarring. Stable scarring in the posterior basal segment right lower lobe. No new or progressive lung findings. Musculoskeletal: Stable absent segment of the right third rib potentially related to bony destruction. Scalloping of portion of the right fourth rib is unchanged on image 47 series 4. CT  ABDOMEN PELVIS FINDINGS Hepatobiliary: Unremarkable Pancreas: Unremarkable Spleen: 2.3 cm hypodense lesion in the lateral spleen is not substantially changed. Adrenals/Urinary Tract: Bilateral renal fluid density lesions favoring benign cysts. Some of the hypodense lesions are technically too small to characterize. Exaggerated anterior-posterior long axis of the right kidney, as before. Substantial diffuse urinary bladder wall thickening, possibly related to bladder outlet obstruction or cystitis. Adrenal glands unremarkable. Stomach/Bowel: Previous cecal and ascending colon wall thickening has resolved. Scattered colonic diverticula. Mild prominence of gas and stool in the distal colon. No dilated small bowel. Vascular/Lymphatic: Atherosclerosis is present, including aortoiliac atherosclerotic disease. No pathologic adenopathy identified in the abdomen/pelvis. Reproductive: Mild prostatomegaly. Fiducial markers noted along the prostate gland. Other: No supplemental non-categorized findings. Musculoskeletal: Degenerative arthropathy of the hips, right greater than left. Lumbar spondylosis and degenerative disc disease causing substantial impingement at all levels between L1 and S1. Grade 1 degenerative retrolisthesis at L1-2. Prominent loss of intervertebral disc height at L5-S1. IMPRESSION: 1. Stable post therapy related findings in the right lung without findings of progression or new lesion. 2. Stable nonspecific hypodense splenic lesion. 3. Stable lobular enlargement of the thyroid gland which has an index 2.2 cm in long axis nodule of the right lobe. In the setting of significant comorbidities or limited life expectancy, no follow-up recommended (ref: J Am Coll Radiol. 2015 Feb;12(2): 143-50). 4. Stable appearance of the absent segment of the right third rib and scalloping of the right fourth rib compatible with prior lytic destruction. 5. Previous wall thickening in the proximal colon has resolved. 6. Urinary  bladder wall thickening, possibly from cystitis and/or  bladder outlet obstruction. 7. Other imaging findings of potential clinical significance: Aortic Atherosclerosis (ICD10-I70.0) and Emphysema (ICD10-J43.9). Coronary atherosclerosis. Systemic atherosclerosis. Bilateral likely benign renal cysts. Prostatomegaly. Impingement at all lumbar levels. Degenerative hip arthropathy bilaterally. Electronically Signed   By: Van Clines M.D.   On: 03/10/2022 11:14     ASSESSMENT/PLAN:  This is a very pleasant 88 year old African-American male diagnosed with a stage IVa (T4, N3, M1 B) non-small cell lung cancer, adenosquamous carcinoma in March 2022 and presented with large mass involving the lateral right upper lobe and right chest wall soft tissue in addition to right hilar, mediastinal and right subpectoral and supraclavicular lymphadenopathy with PD-L1 expression of 90%.   The patient is status post a course of concurrent chemoradiation with carboplatin for an AUC of 2 and paclitaxel 45 mg per metered square.  He is status post 6 cycles.    The patient had evidence of disease progression following concurrent chemoradiation.  The patient is currently undergoing single agent immunotherapy with Libtayo IV every 3 weeks due to his PD-L1 expression of 90%.  The patient is status post 17 cycles.  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  He was seen by Dr. Denna Haggard by dermatology.  The patient recently had a restaging CT scan performed.  Dr. Julien Nordmann personally and independently reviewed the scan and discussed the results with the patient today.  The scan showed no evidence of disease progression. Dr. Julien Nordmann re-discussed that the vitiligo, while not life threatening is likely a side effect of his treatment. Dr. Julien Nordmann gave the patient the option to continue treatment vs stopping treatment; however, there is always concern with disease progression  with stopping treatment, which has been working well for him. He would like to continue on treatment at this time. He will proceed with cycle #18 today as scheduled.   He has baseline CKD. Ok to treat with creatinine today. We will see the patient back for follow-up visit in 3 weeks for evaluation and repeat blood work before starting cycle #19.  Recommend he reach out to his PCP regarding the arthritis in the knee about possible treatment options besides tylenol.   The patient was advised to call immediately if he has any concerning symptoms in the interval. The patient voices understanding of current disease status and treatment options and is in agreement with the current care plan. All questions were answered. The patient knows to call the clinic with any problems, questions or concerns. We can certainly see the patient much sooner if necessary  No orders of the defined types were placed in this encounter.   Margorie Renner L Yuritzy Zehring, PA-C 03/11/22  ADDENDUM: Hematology/Oncology Attending: I had a face-to-face encounter with the patient today.  I reviewed his record, lab, scan and recommended his care plan.  This is a very pleasant 88 years old African-American male with a stage IV non-small cell lung cancer, adenosquamous carcinoma diagnosed in March 2022 with positive PD-L1 expression of 90%.  He was initially treated with a course of concurrent chemoradiation with weekly carboplatin and paclitaxel for the locally advanced disease and he is currently undergoing treatment with immunotherapy with Libtayo (Cempilimab) 350 Mg IV every 3 weeks status post 17 cycles. The patient has been tolerating this treatment fairly well except for the immunotherapy mediated vitiligo. He had repeat CT scan of the chest, abdomen and pelvis performed recently.  I personally and independently reviewed the scan images and discussed the  result with the patient today. His scan showed no concerning findings for  disease progression. I recommended for the patient to continue his current treatment with immunotherapy for now unless he would like to stop the treatment because of the vitiligo. The patient understands that his vitiligo is not a life-threatening condition and he would like to continue with his treatment for the lung cancer. I will see him back for follow-up visit in 3 weeks for evaluation before the next cycle of his treatment. He was advised to call immediately if he has any other concerning symptoms in the interval. The total time spent in the appointment was 30 minutes. Disclaimer: This note was dictated with voice recognition software. Similar sounding words can inadvertently be transcribed and may be missed upon review. Eilleen Kempf, MD

## 2022-03-09 ENCOUNTER — Ambulatory Visit (HOSPITAL_COMMUNITY)
Admission: RE | Admit: 2022-03-09 | Discharge: 2022-03-09 | Disposition: A | Discharge status: Home / Self Care | Payer: Medicare HMO | Source: Ambulatory Visit | Attending: Internal Medicine | Admitting: Internal Medicine

## 2022-03-09 DIAGNOSIS — K5730 Diverticulosis of large intestine without perforation or abscess without bleeding: Secondary | ICD-10-CM | POA: Diagnosis not present

## 2022-03-09 DIAGNOSIS — C3490 Malignant neoplasm of unspecified part of unspecified bronchus or lung: Secondary | ICD-10-CM | POA: Insufficient documentation

## 2022-03-09 DIAGNOSIS — N400 Enlarged prostate without lower urinary tract symptoms: Secondary | ICD-10-CM | POA: Diagnosis not present

## 2022-03-09 DIAGNOSIS — N3289 Other specified disorders of bladder: Secondary | ICD-10-CM | POA: Diagnosis not present

## 2022-03-09 DIAGNOSIS — J439 Emphysema, unspecified: Secondary | ICD-10-CM | POA: Diagnosis not present

## 2022-03-11 ENCOUNTER — Inpatient Hospital Stay: Payer: Medicare HMO

## 2022-03-11 ENCOUNTER — Other Ambulatory Visit: Payer: Medicare HMO

## 2022-03-11 ENCOUNTER — Inpatient Hospital Stay: Payer: Medicare HMO | Attending: Physician Assistant | Admitting: Physician Assistant

## 2022-03-11 ENCOUNTER — Other Ambulatory Visit: Payer: Self-pay

## 2022-03-11 ENCOUNTER — Other Ambulatory Visit: Payer: Self-pay | Admitting: Lab

## 2022-03-11 VITALS — BP 123/65 | HR 63 | Temp 98.1°F | Resp 18 | Ht 74.0 in | Wt 155.3 lb

## 2022-03-11 DIAGNOSIS — Z5112 Encounter for antineoplastic immunotherapy: Secondary | ICD-10-CM | POA: Insufficient documentation

## 2022-03-11 DIAGNOSIS — C3411 Malignant neoplasm of upper lobe, right bronchus or lung: Secondary | ICD-10-CM | POA: Insufficient documentation

## 2022-03-11 DIAGNOSIS — Z79899 Other long term (current) drug therapy: Secondary | ICD-10-CM | POA: Insufficient documentation

## 2022-03-11 LAB — CBC WITH DIFFERENTIAL (CANCER CENTER ONLY)
Abs Immature Granulocytes: 0 10*3/uL (ref 0.00–0.07)
Basophils Absolute: 0 10*3/uL (ref 0.0–0.1)
Basophils Relative: 1 %
Eosinophils Absolute: 0.1 10*3/uL (ref 0.0–0.5)
Eosinophils Relative: 3 %
HCT: 30.4 % — ABNORMAL LOW (ref 39.0–52.0)
Hemoglobin: 10.1 g/dL — ABNORMAL LOW (ref 13.0–17.0)
Immature Granulocytes: 0 %
Lymphocytes Relative: 26 %
Lymphs Abs: 0.8 10*3/uL (ref 0.7–4.0)
MCH: 30.8 pg (ref 26.0–34.0)
MCHC: 33.2 g/dL (ref 30.0–36.0)
MCV: 92.7 fL (ref 80.0–100.0)
Monocytes Absolute: 0.3 10*3/uL (ref 0.1–1.0)
Monocytes Relative: 10 %
Neutro Abs: 1.9 10*3/uL (ref 1.7–7.7)
Neutrophils Relative %: 60 %
Platelet Count: 184 10*3/uL (ref 150–400)
RBC: 3.28 MIL/uL — ABNORMAL LOW (ref 4.22–5.81)
RDW: 14.7 % (ref 11.5–15.5)
WBC Count: 3.1 10*3/uL — ABNORMAL LOW (ref 4.0–10.5)
nRBC: 0 % (ref 0.0–0.2)

## 2022-03-11 LAB — CMP (CANCER CENTER ONLY)
ALT: 9 U/L (ref 0–44)
AST: 9 U/L — ABNORMAL LOW (ref 15–41)
Albumin: 3.1 g/dL — ABNORMAL LOW (ref 3.5–5.0)
Alkaline Phosphatase: 67 U/L (ref 38–126)
Anion gap: 5 (ref 5–15)
BUN: 33 mg/dL — ABNORMAL HIGH (ref 8–23)
CO2: 26 mmol/L (ref 22–32)
Calcium: 8.9 mg/dL (ref 8.9–10.3)
Chloride: 108 mmol/L (ref 98–111)
Creatinine: 2.65 mg/dL — ABNORMAL HIGH (ref 0.61–1.24)
GFR, Estimated: 23 mL/min — ABNORMAL LOW (ref >60)
Glucose, Bld: 99 mg/dL (ref 70–99)
Potassium: 4.2 mmol/L (ref 3.5–5.1)
Sodium: 139 mmol/L (ref 135–145)
Total Bilirubin: 0.3 mg/dL (ref 0.3–1.2)
Total Protein: 7.1 g/dL (ref 6.5–8.1)

## 2022-03-11 LAB — TSH: TSH: 0.21 u[IU]/mL — ABNORMAL LOW (ref 0.350–4.500)

## 2022-03-11 MED ORDER — SODIUM CHLORIDE 0.9 % IV SOLN
350.0000 mg | Freq: Once | INTRAVENOUS | Status: AC
  Administered 2022-03-11: 350 mg via INTRAVENOUS
  Filled 2022-03-11: qty 7

## 2022-03-11 MED ORDER — SODIUM CHLORIDE 0.9 % IV SOLN: Freq: Once | INTRAVENOUS | Status: AC

## 2022-03-11 NOTE — Progress Notes (Signed)
Per Cassie, PA ok to treat with creatinine of 2.65 today.

## 2022-03-11 NOTE — Patient Instructions (Signed)
[facility]  Discharge Instructions: Thank you for choosing [facility] to provide your oncology and hematology care.   If you have a lab appointment with the [facility], please go directly to the [facility] and check in at the registration area.   Wear comfortable clothing and clothing appropriate for easy access to any Portacath or PICC line.   We strive to give you quality time with your provider. You may need to reschedule your appointment if you arrive late (15 or more minutes).  Arriving late affects you and other patients whose appointments are after yours.  Also, if you miss three or more appointments without notifying the office, you may be dismissed from the clinic at the provider's discretion.      For prescription refill requests, have your pharmacy contact our office and allow 72 hours for refills to be completed.    Today you received the following chemotherapy and/or immunotherapy agents: Libtayo      To help prevent nausea and vomiting after your treatment, we encourage you to take your nausea medication as directed.  BELOW ARE SYMPTOMS THAT SHOULD BE REPORTED IMMEDIATELY: *FEVER GREATER THAN 100.4 F (38 C) OR HIGHER *CHILLS OR SWEATING *NAUSEA AND VOMITING THAT IS NOT CONTROLLED WITH YOUR NAUSEA MEDICATION *UNUSUAL SHORTNESS OF BREATH *UNUSUAL BRUISING OR BLEEDING *URINARY PROBLEMS (pain or burning when urinating, or frequent urination) *BOWEL PROBLEMS (unusual diarrhea, constipation, pain near the anus) TENDERNESS IN MOUTH AND THROAT WITH OR WITHOUT PRESENCE OF ULCERS (sore throat, sores in mouth, or a toothache) UNUSUAL RASH, SWELLING OR PAIN  UNUSUAL VAGINAL DISCHARGE OR ITCHING   Items with * indicate a potential emergency and should be followed up as soon as possible or go to the Emergency Department if any problems should occur.  Please show the CHEMOTHERAPY ALERT CARD or IMMUNOTHERAPY ALERT CARD at check-in to  the Emergency Department and triage nurse.  Should you have questions after your visit or need to cancel or reschedule your appointment, please contact [facility]  Dept: [phone]  and follow the prompts.  Office hours are 8:00 a.m. to 4:30 p.m. Monday - Friday. Please note that voicemails left after 4:00 p.m. may not be returned until the following business day.  We are closed weekends and major holidays. You have access to a nurse at all times for urgent questions. Please call the main number to the clinic Dept: [phone] and follow the prompts.   For any non-urgent questions, you may also contact your provider using MyChart. We now offer e-Visits for anyone 10 and older to request care online for non-urgent symptoms. For details visit [email or web address].   Also download the MyChart app! Go to the app store, search "MyChart", open the app, select [facility], and log in with your MyChart username and password.  Due to Covid, a mask is required upon entering the hospital/clinic. If you do not have a mask, one will be given to you upon arrival. For doctor visits, patients may have 1 support person aged 36 or older with them. For treatment visits, patients cannot have anyone with them due to current Covid guidelines and our immunocompromised population.

## 2022-03-12 ENCOUNTER — Telehealth: Payer: Self-pay

## 2022-03-12 ENCOUNTER — Other Ambulatory Visit: Payer: Self-pay | Admitting: Physician Assistant

## 2022-03-12 DIAGNOSIS — E039 Hypothyroidism, unspecified: Secondary | ICD-10-CM

## 2022-03-12 MED ORDER — LEVOTHYROXINE SODIUM 175 MCG PO TABS: 175.0000 ug | ORAL_TABLET | Freq: Every day | ORAL | 2 refills | Status: DC

## 2022-03-12 NOTE — Telephone Encounter (Signed)
Called and spoke with the patient's daughter regarding the following:  "TSH is very low.  We may need to to reduce his dose of levothyroxine probably to 175 mcg and then monitor before any further reduction."  I informed the daughter that a new prescription was called in. She had no questions or concerns.

## 2022-03-30 ENCOUNTER — Other Ambulatory Visit: Payer: Self-pay

## 2022-03-30 DIAGNOSIS — E039 Hypothyroidism, unspecified: Secondary | ICD-10-CM

## 2022-03-30 DIAGNOSIS — C3411 Malignant neoplasm of upper lobe, right bronchus or lung: Secondary | ICD-10-CM

## 2022-03-31 IMAGING — CT CT CHEST W/O CM
2 of 4 series · 11 of 36 positions shown, 13 images · non-contrast
Comparison: Most recent CT chest 05/20/2021.  11/25/2020 PET-CT.

CLINICAL DATA: Primary Cancer Type: Lung
TECHNIQUE: Multidetector CT imaging of the chest and abdomen was performed
following the standard protocol without intravenous contrast.

[Series 2: cap w/o · axial · non-contrast · 0.90mm/px · z∈[+1454,+1789]mm · 8 of 87 slices shown, 10 images]
[im 10/87  mediastinal]
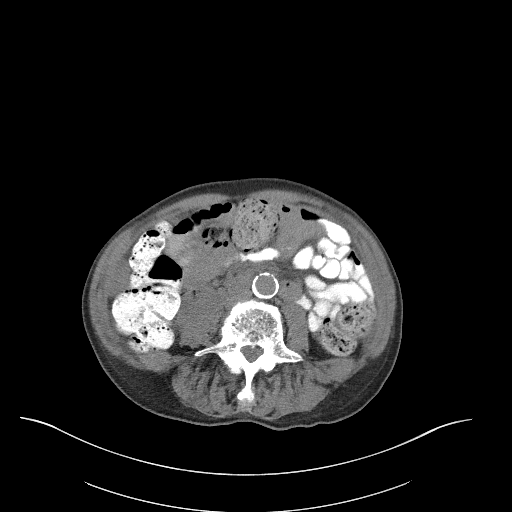
[im 10/87  lung]
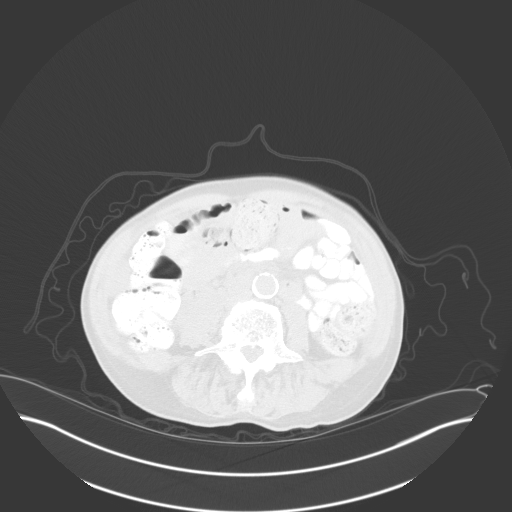
[im 20/87  lung]
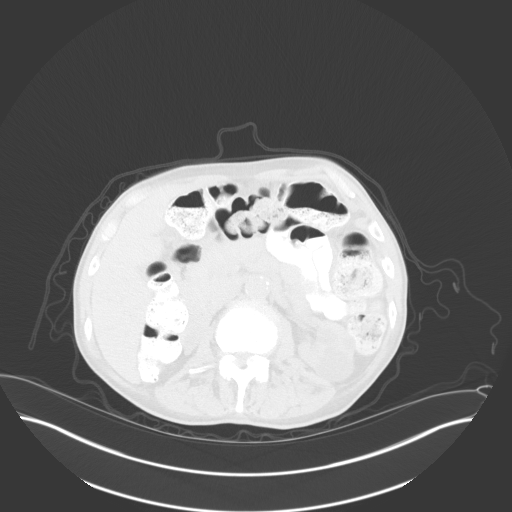
[im 29/87  lung]
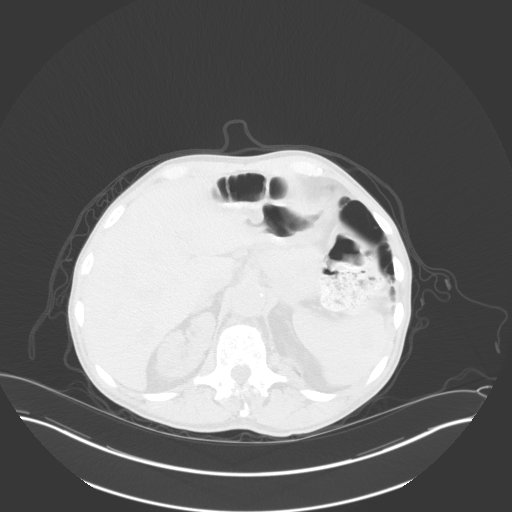
[im 39/87  lung]
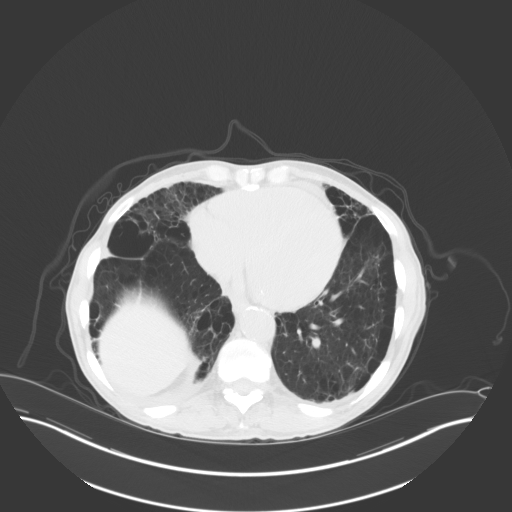
[im 48/87  mediastinal]
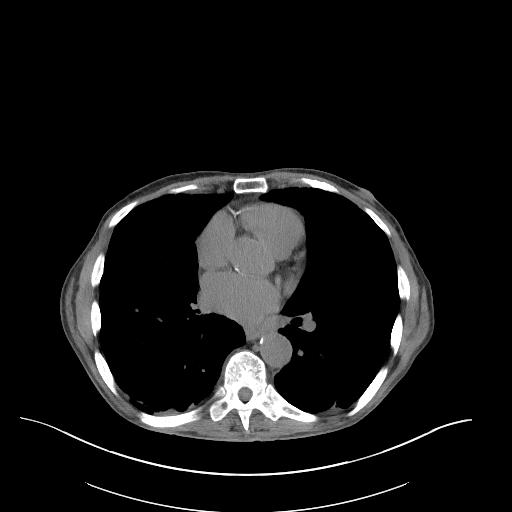
[im 48/87  lung]
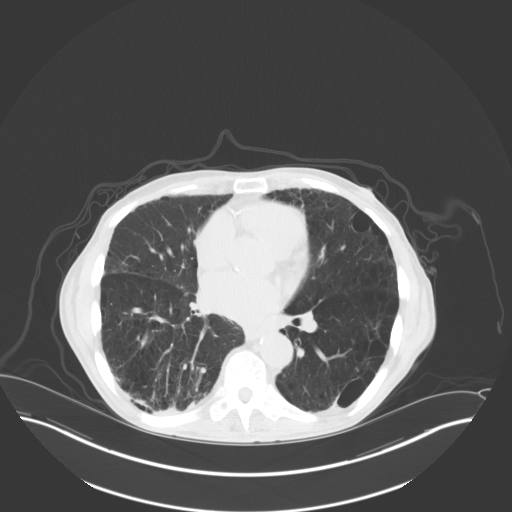
[im 58/87  lung]
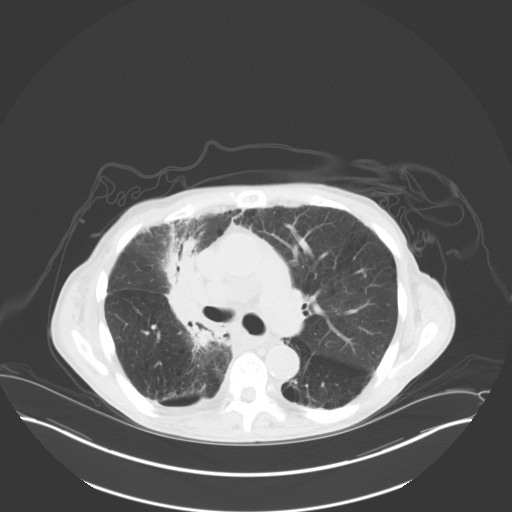
[im 67/87  lung]
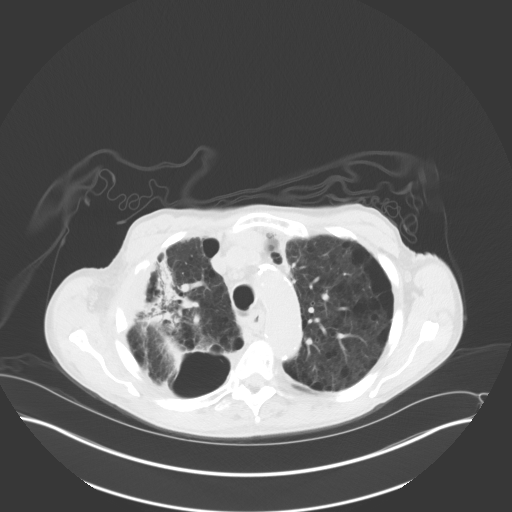
[im 77/87  lung]
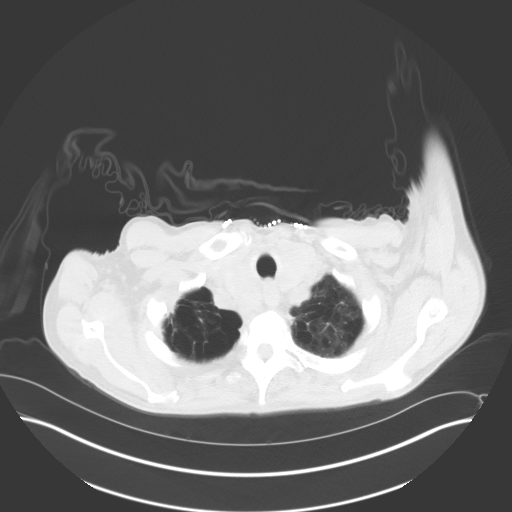

[Series 5: coronals · coronal · 0.82mm/px · 3 of 160 slices shown]
[im 32/160  lung]
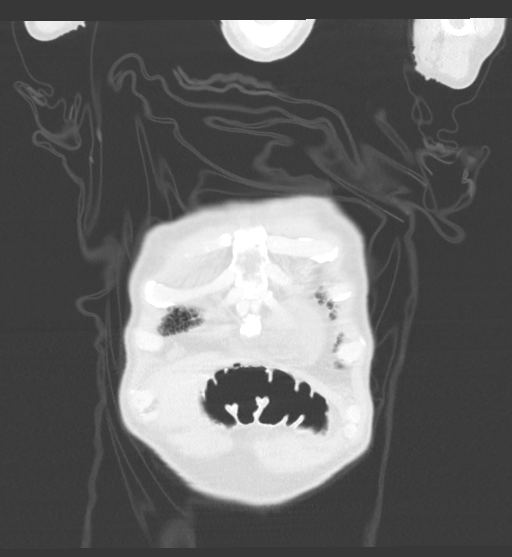
[im 64/160  lung]
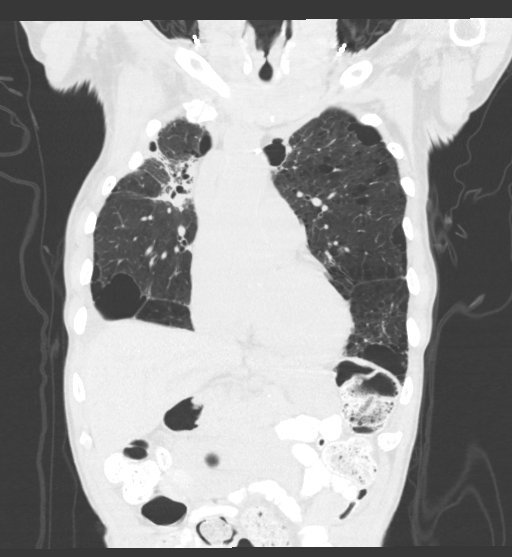
[im 96/160  lung]
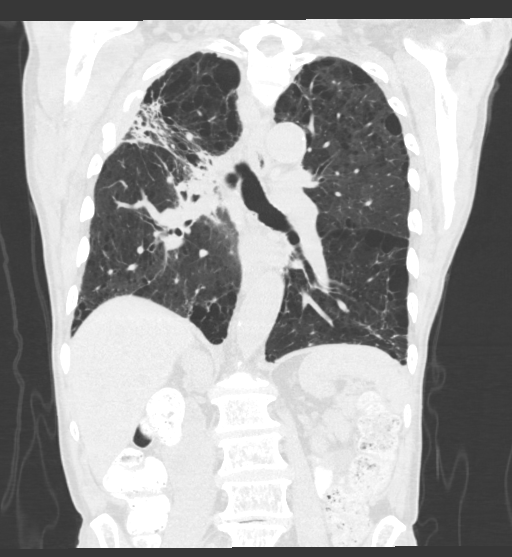

[11 of 36 positions shown; findings below may reference images not displayed]

Imaging Indication: Assess response to therapy

Interval therapy since last imaging? Yes

Initial Cancer Diagnosis

Date: 12/12/2020; Established by: Biopsy-proven

Detailed Pathology: Stage Mohammad Hosien Arsalan cell lung cancer,
adenosquamous carcinoma.

Primary Tumor location: Lateral right upper lobe and right chest
wall.

Surgeries: No.

Chemotherapy: Yes; Ongoing? No; Most recent administration:
01/20/2021

Immunotherapy?  Yes; Type: Libtayo; Ongoing? Yes

Radiation therapy? Yes; Date Range: 12/30/2020 - 02/13/2021; Target:
Right chest

EXAM:
CT CHEST AND ABDOMEN WITHOUT CONTRAST
FINDINGS: CT CHEST FINDINGS WITHOUT CONTRAST

Cardiovascular: Atherosclerotic calcification of the aorta.

Mediastinum/Nodes: Several small mediastinal lymph nodes are not
changed in size. For example LEFT lower paratracheal node measuring
9 mm. No RIGHT axillary adenopathy or sub pectoralis adenopathy.

RIGHT lobe of thyroid gland is enlarged and extends into the upper
RIGHT mediastinum posteriorly. No change

Lungs/Pleura: The peripheral RIGHT chest wall mass measures 6.0 x
2.5 cm (image 18/series 504) decreased from 6.7 x 3.3 cm. There is
again noted destruction of the adjacent third and fourth RIGHT ribs.

Coarse thickened fibrotic change in consolidation with air
bronchograms in the RIGHT upper lobe is similar comparison exam.
There is decrease in loculated fluid in the RIGHT lung apex compared
to prior. No new nodularity.

The LEFT lung there is extensive centrilobular emphysema and
paraseptal emphysema. No new nodularity.

Musculoskeletal: No aggressive osseous lesion.

CT ABDOMEN FINDINGS WITHOUT CONTRAST

Hepatobiliary: No focal hepatic lesion on noncontrast exam.

Pancreas: Normal pancreatic parenchymal intensity. No ductal
dilatation or inflammation.

Spleen: Low-attenuation lesion in the spleen measuring 19 mm is
unchanged.

Adrenals/urinary tract: Adrenal glands normal. Bilateral
low-attenuation renal cystic lesions not changed.

For

Stomach/Bowel: Stomach and limited of the small bowel is
unremarkable

Vascular/Lymphatic: Abdominal aortic normal caliber. No
retroperitoneal periportal lymphadenopathy.

Musculoskeletal: RIGHT third fourth rib destruction unchanged. No
new sites of new skeletal metastasis
IMPRESSION: Chest Impression:

1. Interval decrease in size of RIGHT chest wall mass with
associated rib destruction.
2. Stable fibrotic and consolidative pattern in the RIGHT upper lobe
with air bronchograms. No evidence of new disease in the RIGHT lung.
3. Extensive bilateral centrilobular and paraseptal emphysema again
noted.
4. Interval decrease in loculated effusions in the RIGHT hemithorax.
5. Stable RIGHT thyroid goiter.
6.  Aortic Atherosclerosis ([id number].I).

Abdomen / Pelvis Impression:

1. No evidence metastasis on noncontrast exam.
2. Low-density lesion the spleen is not changed. This lesion was not
hypermetabolic on comparison PET-CT scan.

## 2022-03-31 IMAGING — CT CT ABDOMEN W/O CM
3 of 4 series · 15 of 46 positions shown, 17 images · non-contrast
Comparison: Most recent CT chest 05/20/2021.  11/25/2020 PET-CT.

CLINICAL DATA: Primary Cancer Type: Lung
TECHNIQUE: Multidetector CT imaging of the chest and abdomen was performed
following the standard protocol without intravenous contrast.

[Series 504: cap w/o · axial · non-contrast · 0.90mm/px · z∈[+1449,+1794]mm · 9 of 87 slices shown, 11 images]
[im 9/87  soft-tissue]
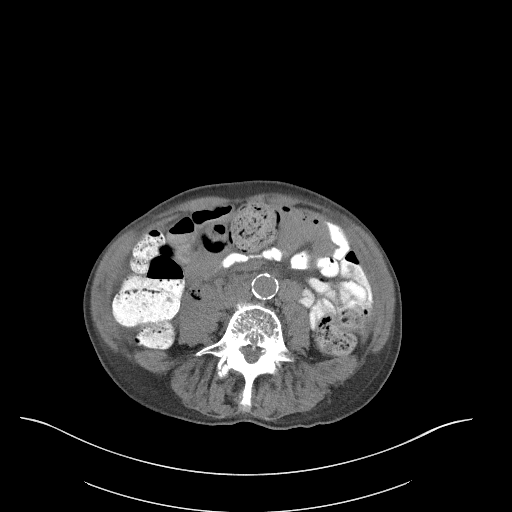
[im 9/87  bone]
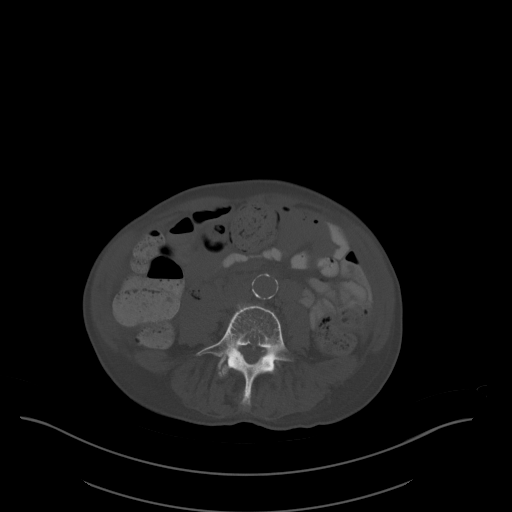
[im 18/87  soft-tissue]
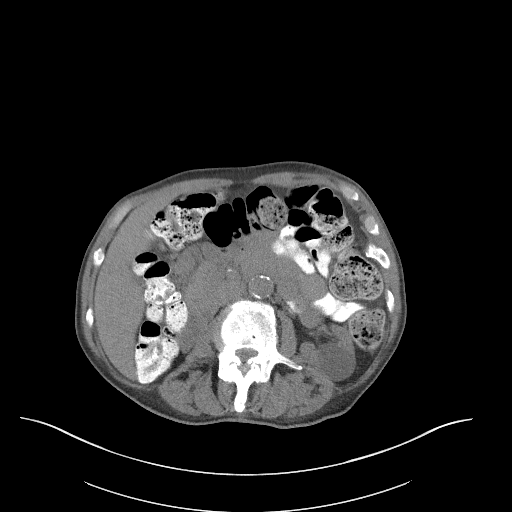
[im 26/87  soft-tissue]
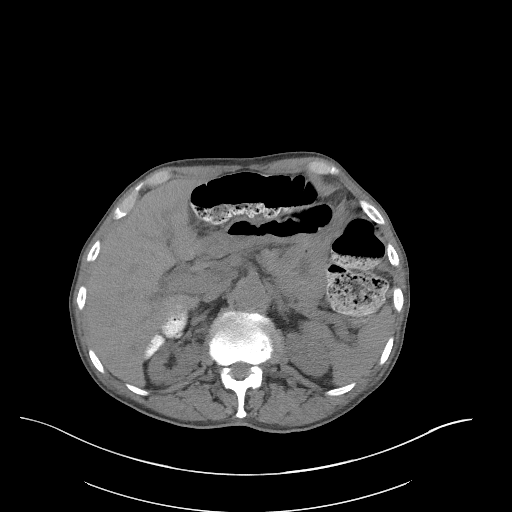
[im 35/87  soft-tissue]
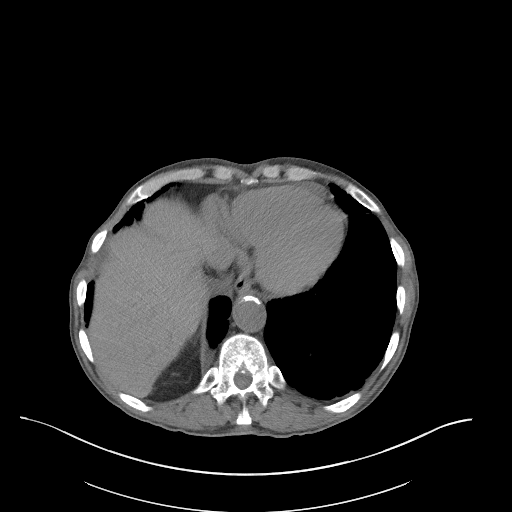
[im 44/87  soft-tissue]
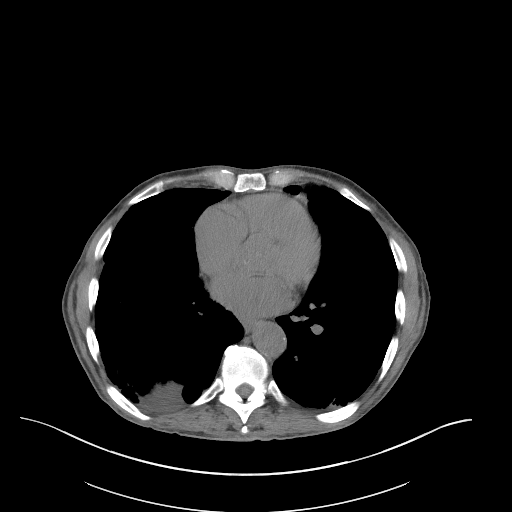
[im 52/87  soft-tissue]
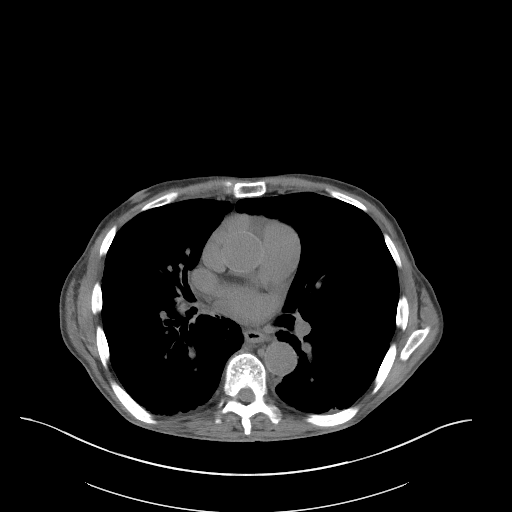
[im 61/87  soft-tissue]
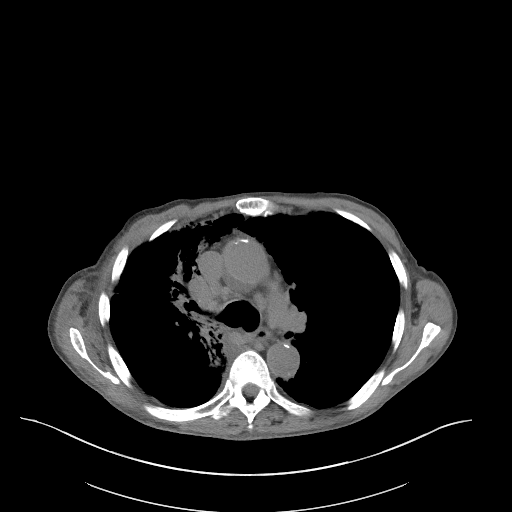
[im 69/87  soft-tissue]
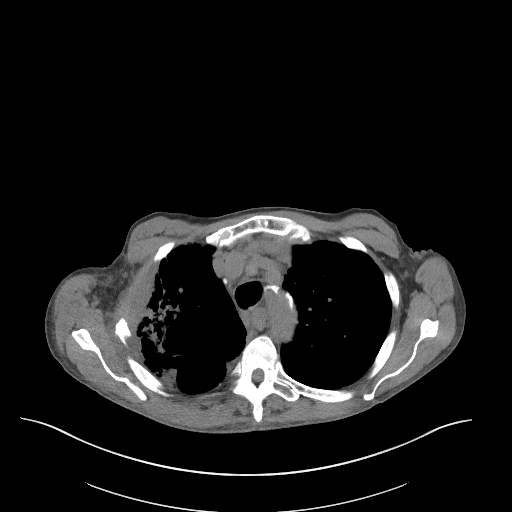
[im 78/87  soft-tissue]
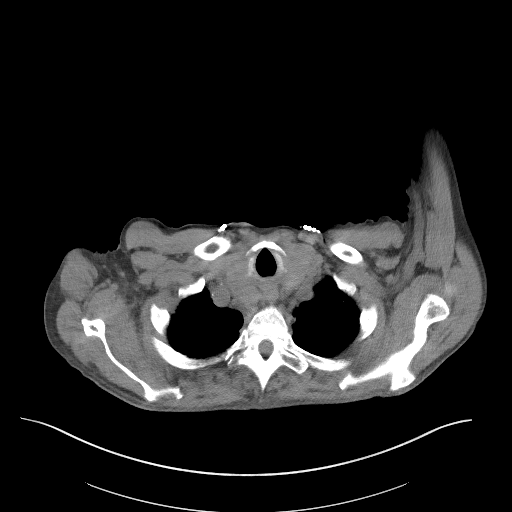
[im 78/87  bone]
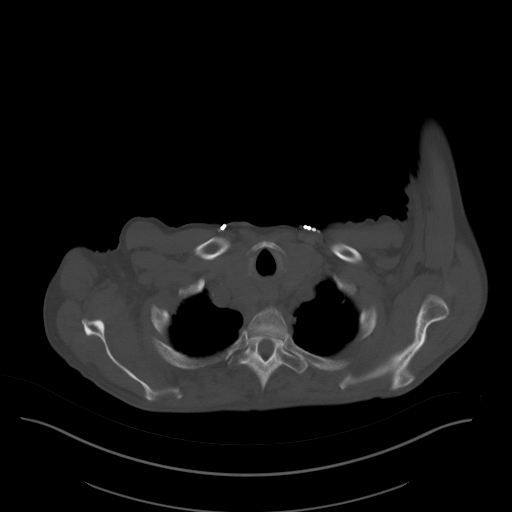

[Series 505: lung · axial · 0.90mm/px · z∈[+1543,+1609]mm · 3 of 165 slices shown]
[im 17/165  bone]
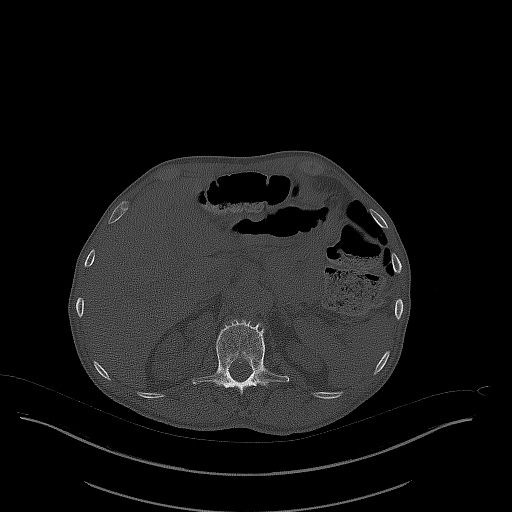
[im 33/165  bone]
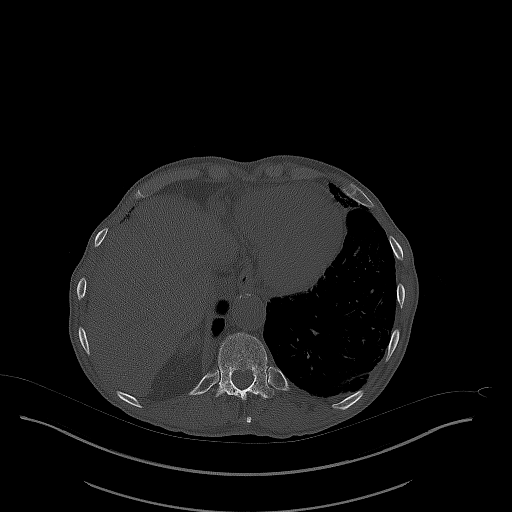
[im 50/165  bone]
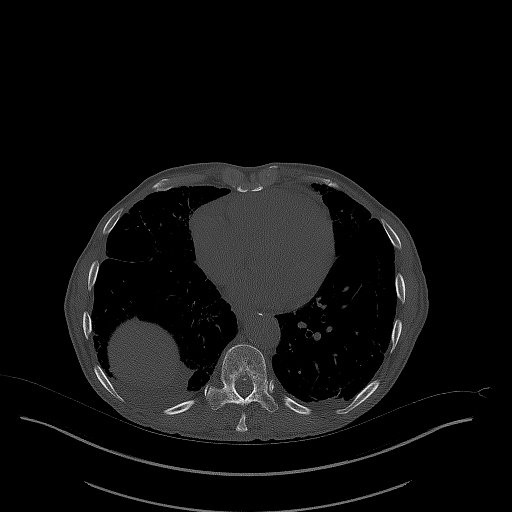

[Series 506: coronals · coronal · 0.82mm/px · 3 of 160 slices shown]
[im 54/160  soft-tissue]
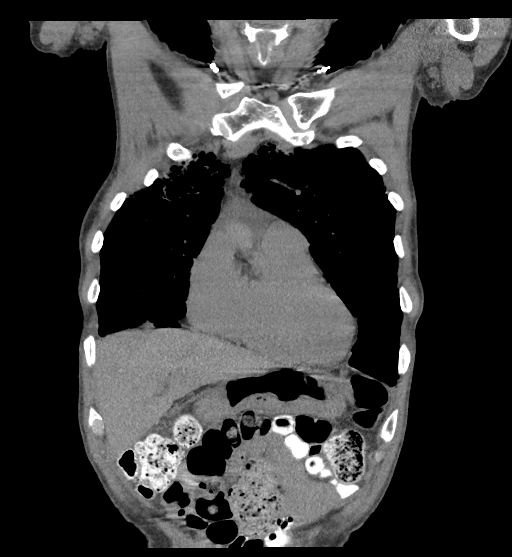
[im 71/160  soft-tissue]
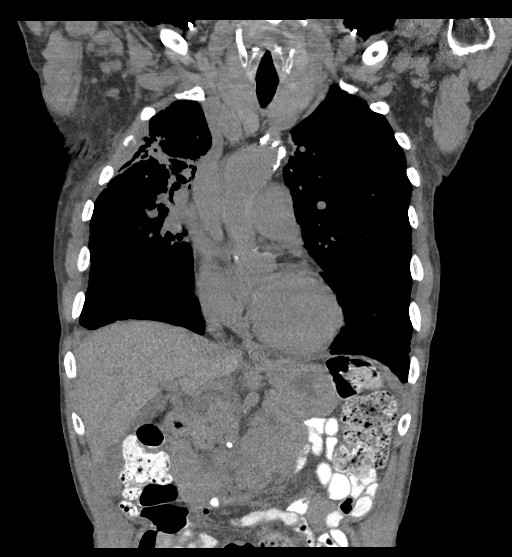
[im 89/160  soft-tissue]
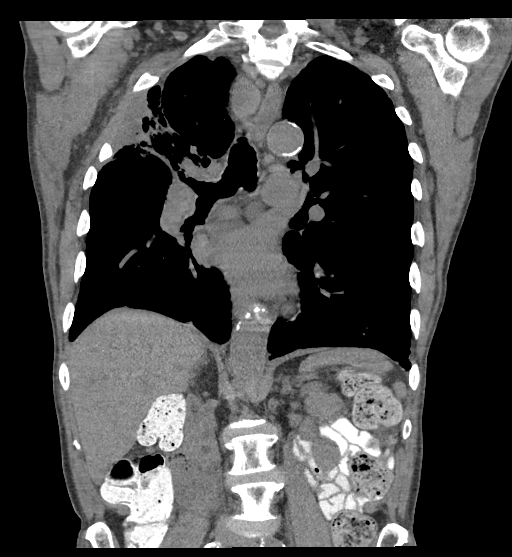

[15 of 46 positions shown; findings below may reference images not displayed]

Imaging Indication: Assess response to therapy

Interval therapy since last imaging? Yes

Initial Cancer Diagnosis

Date: 12/12/2020; Established by: Biopsy-proven

Detailed Pathology: Stage Mohammad Hosien Arsalan cell lung cancer,
adenosquamous carcinoma.

Primary Tumor location: Lateral right upper lobe and right chest
wall.

Surgeries: No.

Chemotherapy: Yes; Ongoing? No; Most recent administration:
01/20/2021

Immunotherapy?  Yes; Type: Libtayo; Ongoing? Yes

Radiation therapy? Yes; Date Range: 12/30/2020 - 02/13/2021; Target:
Right chest

EXAM:
CT CHEST AND ABDOMEN WITHOUT CONTRAST
FINDINGS: CT CHEST FINDINGS WITHOUT CONTRAST

Cardiovascular: Atherosclerotic calcification of the aorta.

Mediastinum/Nodes: Several small mediastinal lymph nodes are not
changed in size. For example LEFT lower paratracheal node measuring
9 mm. No RIGHT axillary adenopathy or sub pectoralis adenopathy.

RIGHT lobe of thyroid gland is enlarged and extends into the upper
RIGHT mediastinum posteriorly. No change

Lungs/Pleura: The peripheral RIGHT chest wall mass measures 6.0 x
2.5 cm (image 18/series 504) decreased from 6.7 x 3.3 cm. There is
again noted destruction of the adjacent third and fourth RIGHT ribs.

Coarse thickened fibrotic change in consolidation with air
bronchograms in the RIGHT upper lobe is similar comparison exam.
There is decrease in loculated fluid in the RIGHT lung apex compared
to prior. No new nodularity.

The LEFT lung there is extensive centrilobular emphysema and
paraseptal emphysema. No new nodularity.

Musculoskeletal: No aggressive osseous lesion.

CT ABDOMEN FINDINGS WITHOUT CONTRAST

Hepatobiliary: No focal hepatic lesion on noncontrast exam.

Pancreas: Normal pancreatic parenchymal intensity. No ductal
dilatation or inflammation.

Spleen: Low-attenuation lesion in the spleen measuring 19 mm is
unchanged.

Adrenals/urinary tract: Adrenal glands normal. Bilateral
low-attenuation renal cystic lesions not changed.

For

Stomach/Bowel: Stomach and limited of the small bowel is
unremarkable

Vascular/Lymphatic: Abdominal aortic normal caliber. No
retroperitoneal periportal lymphadenopathy.

Musculoskeletal: RIGHT third fourth rib destruction unchanged. No
new sites of new skeletal metastasis
IMPRESSION: Chest Impression:

1. Interval decrease in size of RIGHT chest wall mass with
associated rib destruction.
2. Stable fibrotic and consolidative pattern in the RIGHT upper lobe
with air bronchograms. No evidence of new disease in the RIGHT lung.
3. Extensive bilateral centrilobular and paraseptal emphysema again
noted.
4. Interval decrease in loculated effusions in the RIGHT hemithorax.
5. Stable RIGHT thyroid goiter.
6.  Aortic Atherosclerosis ([id number]-Y2I.I).

Abdomen / Pelvis Impression:

1. No evidence metastasis on noncontrast exam.
2. Low-density lesion the spleen is not changed. This lesion was not
hypermetabolic on comparison PET-CT scan.

## 2022-04-01 ENCOUNTER — Telehealth: Payer: Self-pay

## 2022-04-01 ENCOUNTER — Encounter: Payer: Self-pay | Admitting: Internal Medicine

## 2022-04-01 ENCOUNTER — Inpatient Hospital Stay: Payer: Medicare HMO | Admitting: Internal Medicine

## 2022-04-01 ENCOUNTER — Inpatient Hospital Stay: Payer: Medicare HMO

## 2022-04-01 NOTE — Telephone Encounter (Signed)
Patient no show appointments today. Attempted to call pt but his VM is full.

## 2022-04-02 ENCOUNTER — Telehealth: Payer: Self-pay

## 2022-04-02 NOTE — Telephone Encounter (Signed)
Called pt's friend to advise of upcoming appointments.

## 2022-04-07 DIAGNOSIS — N401 Enlarged prostate with lower urinary tract symptoms: Secondary | ICD-10-CM | POA: Diagnosis not present

## 2022-04-07 DIAGNOSIS — C3490 Malignant neoplasm of unspecified part of unspecified bronchus or lung: Secondary | ICD-10-CM | POA: Diagnosis not present

## 2022-04-07 DIAGNOSIS — M7989 Other specified soft tissue disorders: Secondary | ICD-10-CM | POA: Diagnosis not present

## 2022-04-07 DIAGNOSIS — R0609 Other forms of dyspnea: Secondary | ICD-10-CM | POA: Diagnosis not present

## 2022-04-07 DIAGNOSIS — Z79899 Other long term (current) drug therapy: Secondary | ICD-10-CM | POA: Diagnosis not present

## 2022-04-07 DIAGNOSIS — E039 Hypothyroidism, unspecified: Secondary | ICD-10-CM | POA: Diagnosis not present

## 2022-04-07 DIAGNOSIS — L84 Corns and callosities: Secondary | ICD-10-CM | POA: Diagnosis not present

## 2022-04-07 DIAGNOSIS — I10 Essential (primary) hypertension: Secondary | ICD-10-CM | POA: Diagnosis not present

## 2022-04-07 DIAGNOSIS — M170 Bilateral primary osteoarthritis of knee: Secondary | ICD-10-CM | POA: Diagnosis not present

## 2022-04-13 ENCOUNTER — Other Ambulatory Visit: Payer: Self-pay

## 2022-04-13 ENCOUNTER — Inpatient Hospital Stay: Payer: Medicare HMO | Attending: Physician Assistant

## 2022-04-13 ENCOUNTER — Ambulatory Visit: Payer: Medicare HMO

## 2022-04-13 ENCOUNTER — Inpatient Hospital Stay (HOSPITAL_BASED_OUTPATIENT_CLINIC_OR_DEPARTMENT_OTHER): Payer: Medicare HMO | Admitting: Internal Medicine

## 2022-04-13 ENCOUNTER — Telehealth: Payer: Self-pay | Admitting: *Deleted

## 2022-04-13 VITALS — BP 141/66 | HR 59 | Temp 98.8°F | Resp 15 | Wt 155.8 lb

## 2022-04-13 DIAGNOSIS — C7989 Secondary malignant neoplasm of other specified sites: Secondary | ICD-10-CM | POA: Diagnosis not present

## 2022-04-13 DIAGNOSIS — C3411 Malignant neoplasm of upper lobe, right bronchus or lung: Secondary | ICD-10-CM | POA: Insufficient documentation

## 2022-04-13 DIAGNOSIS — C7951 Secondary malignant neoplasm of bone: Secondary | ICD-10-CM | POA: Insufficient documentation

## 2022-04-13 DIAGNOSIS — Z79899 Other long term (current) drug therapy: Secondary | ICD-10-CM | POA: Insufficient documentation

## 2022-04-13 DIAGNOSIS — Z5112 Encounter for antineoplastic immunotherapy: Secondary | ICD-10-CM | POA: Diagnosis not present

## 2022-04-13 DIAGNOSIS — Z87891 Personal history of nicotine dependence: Secondary | ICD-10-CM | POA: Diagnosis not present

## 2022-04-13 DIAGNOSIS — E039 Hypothyroidism, unspecified: Secondary | ICD-10-CM

## 2022-04-13 LAB — CBC WITH DIFFERENTIAL (CANCER CENTER ONLY)
Abs Immature Granulocytes: 0 10*3/uL (ref 0.00–0.07)
Basophils Absolute: 0 10*3/uL (ref 0.0–0.1)
Basophils Relative: 0 %
Eosinophils Absolute: 0.1 10*3/uL (ref 0.0–0.5)
Eosinophils Relative: 4 %
HCT: 30.5 % — ABNORMAL LOW (ref 39.0–52.0)
Hemoglobin: 10.1 g/dL — ABNORMAL LOW (ref 13.0–17.0)
Immature Granulocytes: 0 %
Lymphocytes Relative: 27 %
Lymphs Abs: 0.9 10*3/uL (ref 0.7–4.0)
MCH: 30.1 pg (ref 26.0–34.0)
MCHC: 33.1 g/dL (ref 30.0–36.0)
MCV: 90.8 fL (ref 80.0–100.0)
Monocytes Absolute: 0.3 10*3/uL (ref 0.1–1.0)
Monocytes Relative: 10 %
Neutro Abs: 1.9 10*3/uL (ref 1.7–7.7)
Neutrophils Relative %: 59 %
Platelet Count: 163 10*3/uL (ref 150–400)
RBC: 3.36 MIL/uL — ABNORMAL LOW (ref 4.22–5.81)
RDW: 14.7 % (ref 11.5–15.5)
WBC Count: 3.2 10*3/uL — ABNORMAL LOW (ref 4.0–10.5)
nRBC: 0 % (ref 0.0–0.2)

## 2022-04-13 LAB — CMP (CANCER CENTER ONLY)
ALT: 8 U/L (ref 0–44)
AST: 11 U/L — ABNORMAL LOW (ref 15–41)
Albumin: 3.3 g/dL — ABNORMAL LOW (ref 3.5–5.0)
Alkaline Phosphatase: 66 U/L (ref 38–126)
Anion gap: 5 (ref 5–15)
BUN: 29 mg/dL — ABNORMAL HIGH (ref 8–23)
CO2: 25 mmol/L (ref 22–32)
Calcium: 8.9 mg/dL (ref 8.9–10.3)
Chloride: 109 mmol/L (ref 98–111)
Creatinine: 2.34 mg/dL — ABNORMAL HIGH (ref 0.61–1.24)
GFR, Estimated: 26 mL/min — ABNORMAL LOW (ref >60)
Glucose, Bld: 92 mg/dL (ref 70–99)
Potassium: 4.4 mmol/L (ref 3.5–5.1)
Sodium: 139 mmol/L (ref 135–145)
Total Bilirubin: 0.4 mg/dL (ref 0.3–1.2)
Total Protein: 7.1 g/dL (ref 6.5–8.1)

## 2022-04-13 LAB — TSH: TSH: 0.352 u[IU]/mL (ref 0.350–4.500)

## 2022-04-13 NOTE — Telephone Encounter (Signed)
Spoke with son in-law in lobby, and informed him of pt's chemo appt for 3 pm  Tuesday  7/18.   Son in-law voiced understanding.

## 2022-04-13 NOTE — Progress Notes (Signed)
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  Biomarker Findings Tumor Mutational Burden - 10 Muts/Mb Microsatellite status - MS-Stable Genomic Findings For a complete list of the genes assayed, please refer to the Appendix. KRAS G12C NFKBIA amplification NKX2-1 amplification RBM10 D676f*80 7 Disease relevant genes with no reportable alterations: ALK, BRAF, EGFR, ERBB2, MET, RET, ROS1   PDL1: 90%   PRIOR THERAPY:  Weekly concurrent chemoradiation with carboplatin for an AUC of 2 and paclitaxel 45 mg/m.  First dose on 12/30/2020.  Status post 6 cycles.   CURRENT THERAPY: First-line treatment with immunotherapy with Libtayo (Cempilimab) 350 Mg IV every 3 weeks.  Status post 18 cycles.  INTERVAL HISTORY: Brian DAIGLER89y.o. male returns to the clinic today for follow-up visit accompanied by his son-in-law.  The patient is feeling fine today with no concerning complaints except for shortness of breath with exertion.  He denied having any current chest pain, cough or hemoptysis.  He denied having any fever or chills.  He has no nausea, vomiting, diarrhea or constipation.  He has no headache or visual changes.  He denied having any significant weight loss or night sweats.  He had repeat CT scan of the chest, abdomen and pelvis performed few weeks ago and he is here for evaluation before starting cycle #19.   MEDICAL HISTORY: Past Medical History:  Diagnosis Date   Arthritis    Hypertension    Hyperthyroidism    lung ca 11/2020    ALLERGIES:  has No Known  Allergies.  MEDICATIONS:  Current Outpatient Medications  Medication Sig Dispense Refill   amLODipine (NORVASC) 10 MG tablet Take 10 mg by mouth daily.     gabapentin (NEURONTIN) 250 MG/5ML solution Take 3 mLs (150 mg total) by mouth at bedtime. 90 mL 2   hydrochlorothiazide (MICROZIDE) 12.5 MG capsule Take 12.5 mg by mouth daily.     labetalol (NORMODYNE) 200 MG tablet Take 200 mg by mouth 2 (two) times daily.     levothyroxine (SYNTHROID) 175 MCG tablet Take 1 tablet (175 mcg total) by mouth daily before breakfast. 30 tablet 2   loperamide (IMODIUM) 2 MG capsule Take by mouth as needed for diarrhea or loose stools.     naproxen (NAPROSYN) 500 MG tablet Take 500 mg by mouth 2 (two) times daily as needed for moderate pain.     oxyCODONE-acetaminophen (PERCOCET/ROXICET) 5-325 MG tablet Take 1 tablet by mouth every 8 (eight) hours as needed for severe pain. 30 tablet 0   prochlorperazine (COMPAZINE) 10 MG tablet Take 1 tablet (10 mg total) by mouth every 6 (six) hours as needed for nausea or vomiting. 30 tablet 2   tadalafil (CIALIS) 5 MG tablet Take 5 mg by mouth daily.     tamsulosin (FLOMAX) 0.4 MG CAPS capsule Take 0.4 mg by mouth daily.     No current facility-administered medications for this visit.    SURGICAL HISTORY:  Past Surgical History:  Procedure Laterality Date   COLONOSCOPY N/A 09/02/2016   Procedure: COLONOSCOPY;  Surgeon: NRogene Houston MD;  Location: [facility];  Service:  Endoscopy;  Laterality: N/A;  830   NO PAST SURGERIES     POLYPECTOMY  09/02/2016   Procedure: POLYPECTOMY;  Surgeon: Rogene Houston, MD;  Location: [facility];  Service: Endoscopy;;    REVIEW OF SYSTEMS:  Constitutional: positive for fatigue Eyes: negative Ears, nose, mouth, throat, and face: negative Respiratory: positive for dyspnea on exertion Cardiovascular: negative Gastrointestinal: negative Genitourinary:negative Integument/breast: negative Hematologic/lymphatic:  negative Musculoskeletal:negative Neurological: negative Behavioral/Psych: negative Endocrine: negative Allergic/Immunologic: negative   PHYSICAL EXAMINATION: General appearance: alert, cooperative, and no distress Head: Normocephalic, without obvious abnormality, atraumatic Neck: no adenopathy, no JVD, supple, symmetrical, trachea midline, and thyroid not enlarged, symmetric, no tenderness/mass/nodules Lymph nodes: Cervical, supraclavicular, and axillary nodes normal. Resp: clear to auscultation bilaterally Back: symmetric, no curvature. ROM normal. No CVA tenderness. Cardio: regular rate and rhythm, S1, S2 normal, no murmur, click, rub or gallop GI: soft, non-tender; bowel sounds normal; no masses,  no organomegaly Extremities: extremities normal, atraumatic, no cyanosis or edema Neurologic: Alert and oriented X 3, normal strength and tone. Normal symmetric reflexes. Normal coordination and gait  ECOG PERFORMANCE STATUS: 1 - Symptomatic but completely ambulatory  Blood pressure (!) 141/66, pulse (!) 59, temperature 98.8 F (37.1 C), temperature source Oral, resp. rate 15, weight 155 lb 12.8 oz (70.7 kg), SpO2 94 %.  LABORATORY DATA: Lab Results  Component Value Date   WBC 3.1 (L) 03/11/2022   HGB 10.1 (L) 03/11/2022   HCT 30.4 (L) 03/11/2022   MCV 92.7 03/11/2022   PLT 184 03/11/2022      Chemistry      Component Value Date/Time   NA 139 03/11/2022 1002   K 4.2 03/11/2022 1002   CL 108 03/11/2022 1002   CO2 26 03/11/2022 1002   BUN 33 (H) 03/11/2022 1002   CREATININE 2.65 (H) 03/11/2022 1002      Component Value Date/Time   CALCIUM 8.9 03/11/2022 1002   ALKPHOS 67 03/11/2022 1002   AST 9 (L) 03/11/2022 1002   ALT 9 03/11/2022 1002   BILITOT 0.3 03/11/2022 1002       RADIOGRAPHIC STUDIES: No results found.  ASSESSMENT AND PLAN: This is a very pleasant 88 years old African-American male recently diagnosed with a stage IVA (T4, N3, M1b) non-small cell lung  cancer, adenosquamous carcinoma in March 2022 and presented with large mass involving the lateral right upper lobe and right chest wall soft tissue in addition to right hilar, mediastinal and right subpectoral and supraclavicular lymphadenopathy with PD-L1 expression of 90%.  The patient underwent a course of concurrent chemoradiation with weekly carboplatin for AUC of 2 and paclitaxel 45 Mg/M2 status post 6 cycles. He tolerated the previous course of his concurrent chemoradiation fairly well except for fatigue. Unfortunately his CT scan of the chest after the induction phase showed interval enlargement of a large mass centered at the periphery of the right upper lobe and extending into the adjacent chest wall measuring 8.4 x 6.7 cm with bony destruction of the right third and fourth ribs substantially increased compared to the prior examination and the findings are consistent with worsened malignancy. The patient has PD-L1 expression of 90% and I felt he will be a good candidate for treatment with first-line single agent immunotherapy. I recommended for him treatment with Libtayo (Cempilimab) 350 Mg IV every 3 weeks.  He is status post 18 cycles of treatment. The patient had repeat CT scan of the chest, abdomen pelvis performed few weeks ago.  I personally and independently reviewed  the scans and discussed the result with the patient and his son-in-law. His scan showed no concerning findings for disease progression. I recommended for him to continue his current treatment with Libtayo (Cempilimab) and he will proceed with cycle #19 today. The patient will come back for follow-up visit in 3 weeks for evaluation before starting the next cycle of his treatment. For the hypothyroidism, he will continue with levothyroxine. The patient was advised to call immediately if he has any other concerning symptoms in the interval. The patient voices understanding of current disease status and treatment options and is  in agreement with the current care plan.  All questions were answered. The patient knows to call the clinic with any problems, questions or concerns. We can certainly see the patient much sooner if necessary.   Disclaimer: This note was dictated with voice recognition software. Similar sounding words can inadvertently be transcribed and may not be corrected upon review.

## 2022-04-14 ENCOUNTER — Inpatient Hospital Stay: Payer: Medicare HMO

## 2022-04-14 VITALS — BP 129/66 | HR 62 | Temp 98.5°F | Resp 16

## 2022-04-14 DIAGNOSIS — Z87891 Personal history of nicotine dependence: Secondary | ICD-10-CM | POA: Diagnosis not present

## 2022-04-14 DIAGNOSIS — Z5112 Encounter for antineoplastic immunotherapy: Secondary | ICD-10-CM | POA: Diagnosis not present

## 2022-04-14 DIAGNOSIS — C7951 Secondary malignant neoplasm of bone: Secondary | ICD-10-CM | POA: Diagnosis not present

## 2022-04-14 DIAGNOSIS — Z79899 Other long term (current) drug therapy: Secondary | ICD-10-CM | POA: Diagnosis not present

## 2022-04-14 DIAGNOSIS — C3411 Malignant neoplasm of upper lobe, right bronchus or lung: Secondary | ICD-10-CM

## 2022-04-14 DIAGNOSIS — C7989 Secondary malignant neoplasm of other specified sites: Secondary | ICD-10-CM | POA: Diagnosis not present

## 2022-04-14 MED ORDER — SODIUM CHLORIDE 0.9 % IV SOLN
350.0000 mg | Freq: Once | INTRAVENOUS | Status: AC
  Administered 2022-04-14: 350 mg via INTRAVENOUS
  Filled 2022-04-14: qty 7

## 2022-04-14 MED ORDER — SODIUM CHLORIDE 0.9 % IV SOLN: Freq: Once | INTRAVENOUS | Status: AC

## 2022-04-14 NOTE — Patient Instructions (Signed)
[facility]  Discharge Instructions: Thank you for choosing [facility] to provide your oncology and hematology care.   If you have a lab appointment with the [facility], please go directly to the [facility] and check in at the registration area.   Wear comfortable clothing and clothing appropriate for easy access to any Portacath or PICC line.   We strive to give you quality time with your provider. You may need to reschedule your appointment if you arrive late (15 or more minutes).  Arriving late affects you and other patients whose appointments are after yours.  Also, if you miss three or more appointments without notifying the office, you may be dismissed from the clinic at the provider's discretion.      For prescription refill requests, have your pharmacy contact our office and allow 72 hours for refills to be completed.    Today you received the following chemotherapy and/or immunotherapy agents: Libtayo      To help prevent nausea and vomiting after your treatment, we encourage you to take your nausea medication as directed.  BELOW ARE SYMPTOMS THAT SHOULD BE REPORTED IMMEDIATELY: *FEVER GREATER THAN 100.4 F (38 C) OR HIGHER *CHILLS OR SWEATING *NAUSEA AND VOMITING THAT IS NOT CONTROLLED WITH YOUR NAUSEA MEDICATION *UNUSUAL SHORTNESS OF BREATH *UNUSUAL BRUISING OR BLEEDING *URINARY PROBLEMS (pain or burning when urinating, or frequent urination) *BOWEL PROBLEMS (unusual diarrhea, constipation, pain near the anus) TENDERNESS IN MOUTH AND THROAT WITH OR WITHOUT PRESENCE OF ULCERS (sore throat, sores in mouth, or a toothache) UNUSUAL RASH, SWELLING OR PAIN  UNUSUAL VAGINAL DISCHARGE OR ITCHING   Items with * indicate a potential emergency and should be followed up as soon as possible or go to the Emergency Department if any problems should occur.  Please show the CHEMOTHERAPY ALERT CARD or IMMUNOTHERAPY ALERT CARD at check-in to  the Emergency Department and triage nurse.  Should you have questions after your visit or need to cancel or reschedule your appointment, please contact [facility]  Dept: [phone]  and follow the prompts.  Office hours are 8:00 a.m. to 4:30 p.m. Monday - Friday. Please note that voicemails left after 4:00 p.m. may not be returned until the following business day.  We are closed weekends and major holidays. You have access to a nurse at all times for urgent questions. Please call the main number to the clinic Dept: [phone] and follow the prompts.   For any non-urgent questions, you may also contact your provider using MyChart. We now offer e-Visits for anyone 48 and older to request care online for non-urgent symptoms. For details visit [email or web address].   Also download the MyChart app! Go to the app store, search "MyChart", open the app, select [facility], and log in with your MyChart username and password.  Due to Covid, a mask is required upon entering the hospital/clinic. If you do not have a mask, one will be given to you upon arrival. For doctor visits, patients may have 1 support person aged 17 or older with them. For treatment visits, patients cannot have anyone with them due to current Covid guidelines and our immunocompromised population.

## 2022-04-14 NOTE — Progress Notes (Signed)
Per Dr. Julien Nordmann ok to treat with creatinine of 2.34 yesterday, 04/13/2022.

## 2022-04-20 ENCOUNTER — Other Ambulatory Visit: Payer: Self-pay

## 2022-04-24 ENCOUNTER — Other Ambulatory Visit: Payer: Self-pay

## 2022-05-01 ENCOUNTER — Other Ambulatory Visit: Payer: Self-pay

## 2022-05-01 DIAGNOSIS — C3411 Malignant neoplasm of upper lobe, right bronchus or lung: Secondary | ICD-10-CM

## 2022-05-04 ENCOUNTER — Inpatient Hospital Stay (HOSPITAL_BASED_OUTPATIENT_CLINIC_OR_DEPARTMENT_OTHER): Payer: Medicare HMO | Admitting: Internal Medicine

## 2022-05-04 ENCOUNTER — Inpatient Hospital Stay: Payer: Medicare HMO

## 2022-05-04 ENCOUNTER — Other Ambulatory Visit: Payer: Self-pay

## 2022-05-04 ENCOUNTER — Inpatient Hospital Stay: Payer: Medicare HMO | Attending: Physician Assistant

## 2022-05-04 VITALS — BP 144/64 | HR 62 | Temp 97.7°F | Resp 20

## 2022-05-04 VITALS — BP 134/69 | HR 61 | Temp 97.1°F | Resp 18 | Wt 155.2 lb

## 2022-05-04 DIAGNOSIS — C3490 Malignant neoplasm of unspecified part of unspecified bronchus or lung: Secondary | ICD-10-CM | POA: Diagnosis not present

## 2022-05-04 DIAGNOSIS — Z79899 Other long term (current) drug therapy: Secondary | ICD-10-CM | POA: Insufficient documentation

## 2022-05-04 DIAGNOSIS — Z5112 Encounter for antineoplastic immunotherapy: Secondary | ICD-10-CM | POA: Insufficient documentation

## 2022-05-04 DIAGNOSIS — C3411 Malignant neoplasm of upper lobe, right bronchus or lung: Secondary | ICD-10-CM

## 2022-05-04 LAB — CMP (CANCER CENTER ONLY)
ALT: 9 U/L (ref 0–44)
AST: 10 U/L — ABNORMAL LOW (ref 15–41)
Albumin: 3.2 g/dL — ABNORMAL LOW (ref 3.5–5.0)
Alkaline Phosphatase: 69 U/L (ref 38–126)
Anion gap: 5 (ref 5–15)
BUN: 32 mg/dL — ABNORMAL HIGH (ref 8–23)
CO2: 24 mmol/L (ref 22–32)
Calcium: 8.4 mg/dL — ABNORMAL LOW (ref 8.9–10.3)
Chloride: 109 mmol/L (ref 98–111)
Creatinine: 2.53 mg/dL — ABNORMAL HIGH (ref 0.61–1.24)
GFR, Estimated: 24 mL/min — ABNORMAL LOW (ref >60)
Glucose, Bld: 94 mg/dL (ref 70–99)
Potassium: 4.3 mmol/L (ref 3.5–5.1)
Sodium: 138 mmol/L (ref 135–145)
Total Bilirubin: 0.3 mg/dL (ref 0.3–1.2)
Total Protein: 7.1 g/dL (ref 6.5–8.1)

## 2022-05-04 LAB — CBC WITH DIFFERENTIAL (CANCER CENTER ONLY)
Abs Immature Granulocytes: 0.01 10*3/uL (ref 0.00–0.07)
Basophils Absolute: 0 10*3/uL (ref 0.0–0.1)
Basophils Relative: 0 %
Eosinophils Absolute: 0.1 10*3/uL (ref 0.0–0.5)
Eosinophils Relative: 4 %
HCT: 29.7 % — ABNORMAL LOW (ref 39.0–52.0)
Hemoglobin: 10 g/dL — ABNORMAL LOW (ref 13.0–17.0)
Immature Granulocytes: 0 %
Lymphocytes Relative: 32 %
Lymphs Abs: 1 10*3/uL (ref 0.7–4.0)
MCH: 30.4 pg (ref 26.0–34.0)
MCHC: 33.7 g/dL (ref 30.0–36.0)
MCV: 90.3 fL (ref 80.0–100.0)
Monocytes Absolute: 0.4 10*3/uL (ref 0.1–1.0)
Monocytes Relative: 12 %
Neutro Abs: 1.5 10*3/uL — ABNORMAL LOW (ref 1.7–7.7)
Neutrophils Relative %: 52 %
Platelet Count: 181 10*3/uL (ref 150–400)
RBC: 3.29 MIL/uL — ABNORMAL LOW (ref 4.22–5.81)
RDW: 15.1 % (ref 11.5–15.5)
WBC Count: 3 10*3/uL — ABNORMAL LOW (ref 4.0–10.5)
nRBC: 0 % (ref 0.0–0.2)

## 2022-05-04 LAB — TSH: TSH: 0.681 u[IU]/mL (ref 0.350–4.500)

## 2022-05-04 MED ORDER — SODIUM CHLORIDE 0.9 % IV SOLN
350.0000 mg | Freq: Once | INTRAVENOUS | Status: AC
  Administered 2022-05-04: 350 mg via INTRAVENOUS
  Filled 2022-05-04: qty 7

## 2022-05-04 MED ORDER — SODIUM CHLORIDE 0.9 % IV SOLN: Freq: Once | INTRAVENOUS | Status: AC

## 2022-05-04 NOTE — Patient Instructions (Addendum)
[facility]  Discharge Instructions: Thank you for choosing [facility] to provide your oncology and hematology care.   If you have a lab appointment with the [facility], please go directly to the [facility] and check in at the registration area.   Wear comfortable clothing and clothing appropriate for easy access to any Portacath or PICC line.   We strive to give you quality time with your provider. You may need to reschedule your appointment if you arrive late (15 or more minutes).  Arriving late affects you and other patients whose appointments are after yours.  Also, if you miss three or more appointments without notifying the office, you may be dismissed from the clinic at the provider's discretion.      For prescription refill requests, have your pharmacy contact our office and allow 72 hours for refills to be completed.    Today you received the following chemotherapy and/or immunotherapy agent: Libtayo   To help prevent nausea and vomiting after your treatment, we encourage you to take your nausea medication as directed.  BELOW ARE SYMPTOMS THAT SHOULD BE REPORTED IMMEDIATELY: *FEVER GREATER THAN 100.4 F (38 C) OR HIGHER *CHILLS OR SWEATING *NAUSEA AND VOMITING THAT IS NOT CONTROLLED WITH YOUR NAUSEA MEDICATION *UNUSUAL SHORTNESS OF BREATH *UNUSUAL BRUISING OR BLEEDING *URINARY PROBLEMS (pain or burning when urinating, or frequent urination) *BOWEL PROBLEMS (unusual diarrhea, constipation, pain near the anus) TENDERNESS IN MOUTH AND THROAT WITH OR WITHOUT PRESENCE OF ULCERS (sore throat, sores in mouth, or a toothache) UNUSUAL RASH, SWELLING OR PAIN  UNUSUAL VAGINAL DISCHARGE OR ITCHING   Items with * indicate a potential emergency and should be followed up as soon as possible or go to the Emergency Department if any problems should occur.  Please show the CHEMOTHERAPY ALERT CARD or IMMUNOTHERAPY ALERT CARD at check-in to the  Emergency Department and triage nurse.  Should you have questions after your visit or need to cancel or reschedule your appointment, please contact [facility]  Dept: [phone]  and follow the prompts.  Office hours are 8:00 a.m. to 4:30 p.m. Monday - Friday. Please note that voicemails left after 4:00 p.m. may not be returned until the following business day.  We are closed weekends and major holidays. You have access to a nurse at all times for urgent questions. Please call the main number to the clinic Dept: [phone] and follow the prompts.   For any non-urgent questions, you may also contact your provider using MyChart. We now offer e-Visits for anyone 62 and older to request care online for non-urgent symptoms. For details visit [email or web address].   Also download the MyChart app! Go to the app store, search "MyChart", open the app, select [facility], and log in with your MyChart username and password.  Masks are optional in the cancer centers. If you would like for your care team to wear a mask while they are taking care of you, please let them know. You may have one support person who is at least 86 years old accompany you for your appointments.  Cemiplimab Injection What is this medication? CEMIPLIMAB (se MIP li mab) treats skin cancer and lung cancer. It works by helping your immune system slow or stop the spread of cancer cells. It is a monoclonal antibody. This medicine may be used for other purposes; ask your health care provider or pharmacist if you have questions. COMMON BRAND NAME(S): LIBTAYO What should I tell  my care team before I take this medication? They need to know if you have any of these conditions: Allogeneic stem cell transplant (uses someone else's stem cells) Autoimmune diseases, such as Crohn's disease, ulcerative colitis, or lupus Diabetes Nervous system problems, such as myasthenia gravis or Guillain-Barre syndrome Organ  transplant Recent or ongoing radiation Thyroid disease An unusual or allergic reaction to cemiplimab, other medications, foods, dyes, or preservatives Pregnant or trying to get pregnant Breast-feeding How should I use this medication? This medication is infused into a vein. It is given by your care team in a hospital or clinic setting. A special MedGuide will be given to you before each treatment. Be sure to read this information carefully each time. Talk to your care team about the use of this medication in children. Special care may be needed. Overdosage: If you think you have taken too much of this medicine contact a poison control center or emergency room at once. NOTE: This medicine is only for you. Do not share this medicine with others. What if I miss a dose? Keep appointments for follow-up doses. It is important not to miss your dose. Call your care team if you are unable to keep an appointment. What may interact with this medication? Interactions have not been studied. This list may not describe all possible interactions. Give your health care provider a list of all the medicines, herbs, non-prescription drugs, or dietary supplements you use. Also tell them if you smoke, drink alcohol, or use illegal drugs. Some items may interact with your medicine. What should I watch for while using this medication? This medication may make you feel generally unwell. This is not uncommon as chemotherapy can affect healthy cells as well as cancer cells. Report any side effects. Continue your course of treatment even though you feel ill until your care team tells you to stop. You may need blood work done while you are taking this medication. This medication may cause serious skin reactions. They can happen in the weeks to months after starting the medication. Contact your care team right away if you notice fevers or flu-like symptoms with a rash. The rash may be red or purple and then turn into blisters  or peeling of the skin. You may also notice a red rash with swelling of the face, lips, or lymph nodes in your neck or under your arms. Tell your care team right away if you have any changes in your vision. This medication may increase blood sugar. The risk may be higher in patients who already have diabetes. Ask your care team what you can do to lower your risk of diabetes while taking this medication. Talk to your care team if you wish to become pregnant or think you might be pregnant. This medication can cause serious birth defects if taken during pregnancy. A negative pregnancy test is required before starting this medication. A reliable form of contraception is recommended while taking this medication and for at least 4 months after stopping it. Do not breast-feed while taking this medication and for at least 4 months after stopping it. What side effects may I notice from receiving this medication? Side effects that you should report to your care team as soon as possible: Allergic reactions--skin rash, itching, hives, swelling of the face, lips, tongue, or throat Dry cough, shortness of breath or trouble breathing Eye pain, redness, irritation, or discharge with blurry or decreased vision Heart muscle inflammation--unusual weakness or fatigue, shortness of breath, chest pain, fast or  irregular heartbeat, dizziness, swelling of the ankles, feet, or hands Hormone gland problems--headache, sensitivity to light, unusual weakness or fatigue, dizziness, fast or irregular heartbeat, increased sensitivity to cold or heat, excessive sweating, constipation, hair loss, increased thirst or amount of urine, tremors or shaking, irritability Infusion reactions--chest pain, shortness of breath or trouble breathing, feeling faint or lightheaded Kidney injury (glomerulonephritis)--decrease in the amount of urine, red or dark brown urine, foamy or bubbly urine, swelling of the ankles, hands, or feet Liver  injury--right upper belly pain, loss of appetite, nausea, light-colored stool, dark yellow or brown urine, yellowing skin or eyes, unusual weakness or fatigue Pain, tingling, or numbness in the hands or feet, muscle weakness, change in vision, confusion or trouble speaking, loss of balance or coordination, trouble walking, seizures Rash, fever, and swollen lymph nodes Redness, blistering, peeling, or loosening of the skin, including inside the mouth Sudden or severe stomach pain, bloody diarrhea, fever, nausea, vomiting Side effects that usually do not require medical attention (report these to your care team if they continue or are bothersome): Bone, joint, or muscle pain Diarrhea Fatigue Loss of appetite Nausea Skin rash This list may not describe all possible side effects. Call your doctor for medical advice about side effects. You may report side effects to FDA at 1-800-FDA-1088. Where should I keep my medication? This medication is given in a hospital or clinic and will not be stored at home. NOTE: This sheet is a summary. It may not cover all possible information. If you have questions about this medicine, talk to your doctor, pharmacist, or health care provider.  2023 Elsevier/Gold Standard (2021-08-20 00:00:00)

## 2022-05-04 NOTE — Progress Notes (Signed)
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  Biomarker Findings Tumor Mutational Burden - 10 Muts/Mb Microsatellite status - MS-Stable Genomic Findings For a complete list of the genes assayed, please refer to the Appendix. KRAS G12C NFKBIA amplification NKX2-1 amplification RBM10 D673f*80 7 Disease relevant genes with no reportable alterations: ALK, BRAF, EGFR, ERBB2, MET, RET, ROS1   PDL1: 90%   PRIOR THERAPY:  Weekly concurrent chemoradiation with carboplatin for an AUC of 2 and paclitaxel 45 mg/m.  First dose on 12/30/2020.  Status post 6 cycles.   CURRENT THERAPY: First-line treatment with immunotherapy with Libtayo (Cempilimab) 350 Mg IV every 3 weeks.  Status post 19 cycles.  INTERVAL HISTORY: HFOX [id number].o. male returns to the clinic today for follow-up visit accompanied by his son-in-law.  The patient has no complaints today except for the immunotherapy induced vitiligo which is currently stable.  He denied having any current chest pain, shortness of breath, cough or hemoptysis.  He denied having any nausea, vomiting, diarrhea or constipation.  He has no headache or visual changes.  He denied having any recent weight loss or night sweats.  He continues to tolerate his treatment with Libtayo (Cempilimab) fairly well.  The patient is here today for evaluation before starting cycle #20.  MEDICAL HISTORY: Past Medical History:  Diagnosis Date   Arthritis    Hypertension    Hyperthyroidism    lung ca 11/2020    ALLERGIES:  has No Known  Allergies.  MEDICATIONS:  Current Outpatient Medications  Medication Sig Dispense Refill   amLODipine (NORVASC) 10 MG tablet Take 10 mg by mouth daily.     gabapentin (NEURONTIN) 250 MG/5ML solution Take 3 mLs (150 mg total) by mouth at bedtime. 90 mL 2   hydrochlorothiazide (MICROZIDE) 12.5 MG capsule Take 12.5 mg by mouth daily.     labetalol (NORMODYNE) 200 MG tablet Take 200 mg by mouth 2 (two) times daily.     levothyroxine (SYNTHROID) 175 MCG tablet Take 1 tablet (175 mcg total) by mouth daily before breakfast. 30 tablet 2   loperamide (IMODIUM) 2 MG capsule Take by mouth as needed for diarrhea or loose stools.     naproxen (NAPROSYN) 500 MG tablet Take 500 mg by mouth 2 (two) times daily as needed for moderate pain.     oxyCODONE-acetaminophen (PERCOCET/ROXICET) 5-325 MG tablet Take 1 tablet by mouth every 8 (eight) hours as needed for severe pain. 30 tablet 0   prochlorperazine (COMPAZINE) 10 MG tablet Take 1 tablet (10 mg total) by mouth every 6 (six) hours as needed for nausea or vomiting. 30 tablet 2   tadalafil (CIALIS) 5 MG tablet Take 5 mg by mouth daily.     tamsulosin (FLOMAX) 0.4 MG CAPS capsule Take 0.4 mg by mouth daily.     No current facility-administered medications for this visit.    SURGICAL HISTORY:  Past Surgical History:  Procedure Laterality Date   COLONOSCOPY N/A 09/02/2016   Procedure: COLONOSCOPY;  Surgeon: NRogene Houston MD;  Location: [facility];  Service: Endoscopy;  Laterality: N/A;  830  NO PAST SURGERIES     POLYPECTOMY  09/02/2016   Procedure: POLYPECTOMY;  Surgeon: Rogene Houston, MD;  Location: [facility];  Service: Endoscopy;;    REVIEW OF SYSTEMS:  A comprehensive review of systems was negative except for: Constitutional: positive for fatigue Musculoskeletal: positive for arthralgias   PHYSICAL EXAMINATION: General appearance: alert, cooperative, and no distress Head: Normocephalic, without obvious abnormality, atraumatic Neck: no  adenopathy, no JVD, supple, symmetrical, trachea midline, and thyroid not enlarged, symmetric, no tenderness/mass/nodules Lymph nodes: Cervical, supraclavicular, and axillary nodes normal. Resp: clear to auscultation bilaterally Back: symmetric, no curvature. ROM normal. No CVA tenderness. Cardio: regular rate and rhythm, S1, S2 normal, no murmur, click, rub or gallop GI: soft, non-tender; bowel sounds normal; no masses,  no organomegaly Extremities: extremities normal, atraumatic, no cyanosis or edema  ECOG PERFORMANCE STATUS: 1 - Symptomatic but completely ambulatory  Blood pressure 134/69, pulse 61, temperature (!) 97.1 F (36.2 C), temperature source Tympanic, resp. rate 18, weight 155 lb 3.2 oz (70.4 kg), SpO2 95 %.  LABORATORY DATA: Lab Results  Component Value Date   WBC 3.2 (L) 04/13/2022   HGB 10.1 (L) 04/13/2022   HCT 30.5 (L) 04/13/2022   MCV 90.8 04/13/2022   PLT 163 04/13/2022      Chemistry      Component Value Date/Time   NA 139 04/13/2022 0811   K 4.4 04/13/2022 0811   CL 109 04/13/2022 0811   CO2 25 04/13/2022 0811   BUN 29 (H) 04/13/2022 0811   CREATININE 2.34 (H) 04/13/2022 0811      Component Value Date/Time   CALCIUM 8.9 04/13/2022 0811   ALKPHOS 66 04/13/2022 0811   AST 11 (L) 04/13/2022 0811   ALT 8 04/13/2022 0811   BILITOT 0.4 04/13/2022 0811       RADIOGRAPHIC STUDIES: No results found.  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  The patient underwent a course of concurrent chemoradiation with weekly carboplatin for AUC of 2 and paclitaxel 45 Mg/M2 status post 6  cycles. He tolerated the previous course of his concurrent chemoradiation fairly well except for fatigue. Unfortunately his CT scan of the chest after the induction phase showed interval enlargement of a large mass centered at the periphery of the right upper lobe and extending into the adjacent chest wall measuring 8.4 x 6.7 cm with bony destruction of the right third and fourth ribs substantially increased compared to the prior examination and the findings are consistent with worsened malignancy. The patient has PD-L1 expression of 90% and I felt he will be a good candidate for treatment with first-line single agent immunotherapy. I recommended for him treatment with Libtayo (Cempilimab) 350 Mg IV every 3 weeks.  He is status post 19 cycles of treatment. The patient has been tolerating his treatment fairly well with no concerning adverse effect except for the immunotherapy mediated vitiligo and it is currently stable. I recommended for him to proceed with cycle #20 today as planned. I will see him back for follow-up visit in 3 weeks for evaluation with repeat CT scan of the chest, abdomen pelvis for restaging of his disease. For the hypothyroidism, the  patient will continue his current treatment with levothyroxine. He was advised to call immediately if he has any other concerning symptoms in the interval.  The patient voices understanding of current disease status and treatment options and is in agreement with the current care plan.  All questions were answered. The patient knows to call the clinic with any problems, questions or concerns. We can certainly see the patient much sooner if necessary.   Disclaimer: This note was dictated with voice recognition software. Similar sounding words can inadvertently be transcribed and may not be corrected upon review.

## 2022-05-04 NOTE — Progress Notes (Signed)
Per Dr. Julien Nordmann, ok for treatment today with elevated serum creatine 2.53 mg/dL.

## 2022-05-05 ENCOUNTER — Other Ambulatory Visit: Payer: Self-pay

## 2022-05-06 ENCOUNTER — Other Ambulatory Visit: Payer: Self-pay

## 2022-05-13 ENCOUNTER — Other Ambulatory Visit: Payer: Medicare HMO

## 2022-05-13 ENCOUNTER — Ambulatory Visit: Payer: Medicare HMO

## 2022-05-13 ENCOUNTER — Ambulatory Visit: Payer: Medicare HMO | Admitting: Internal Medicine

## 2022-05-18 ENCOUNTER — Telehealth: Payer: Self-pay | Admitting: Physician Assistant

## 2022-05-18 NOTE — Telephone Encounter (Signed)
I called the patient's daughter to give instructions on how to schedule her father's CT scan before his appointment next week. She states she has the number and was just about to call them.

## 2022-05-18 NOTE — Progress Notes (Signed)
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   Biomarker Findings Tumor Mutational Burden - 10 Muts/Mb Microsatellite status - MS-Stable Genomic Findings For a complete list of the genes assayed, please refer to the Appendix. KRAS G12C NFKBIA amplification NKX2-1 amplification RBM10 D625f*80 7 Disease relevant genes with no reportable alterations: ALK, BRAF, EGFR, ERBB2, MET, RET, ROS1   PDL1: 90%  PRIOR THERAPY: Weekly concurrent chemoradiation with carboplatin for an AUC of 2 and paclitaxel 45 mg/m.  First dose on 12/30/2020.  Status post 6 cycles  CURRENT THERAPY:  First-line treatment with immunotherapy with Libtayo (Cempilimab) 350 Mg IV every 3 weeks. First dose on 03/19/21. Status post 20 cycles.  INTERVAL HISTORY: HOTHNIEL MARET88y.o. male returns to the clinic today for a follow-up visit accompanied by his son-in-law.  The patient is feeling fairly well today without any concern complaints. He is tolerating his treatment well except he developed immunotherapy mediated vitiligo. He feels well. He denies any fever, chills, or night sweats.  He was previously followed by member the nutritionist team for his weight loss; however, his weight has been stable for the last few appointments. He reports stable fatigue. He reports dyspnea on exertion which is stable/slightly improved. He reports he can walk farther without getting tired/short of breath. He denies significant cough. Denies any chest pain or hemoptysis. Denies any headache or visual changes. He denies nausea, vomiting, diarrhea, constipation, abdominal pain, or  blood in the stool. His last bowel movement was last night and was normal color and consistency. The patient recently had a restaging CT scan performed.  He is here today for evaluation and repeat blood work before his next cycle of treatment with cycle #21.   MEDICAL HISTORY: Past Medical History:  Diagnosis Date   Arthritis    Hypertension    Hyperthyroidism    lung ca 11/2020    ALLERGIES:  has No Known Allergies.  MEDICATIONS:  Current Outpatient Medications  Medication Sig Dispense Refill   amLODipine (NORVASC) 10 MG tablet Take 10 mg by mouth daily.     gabapentin (NEURONTIN) 250 MG/5ML solution Take 3 mLs (150 mg total) by mouth at bedtime. 90 mL 2   hydrochlorothiazide (MICROZIDE) 12.5 MG capsule Take 12.5 mg by mouth daily.     labetalol (NORMODYNE) 200 MG tablet Take 200 mg by mouth 2 (two) times daily.     levothyroxine (SYNTHROID) 175 MCG tablet Take 1 tablet (175 mcg total) by mouth daily before breakfast. 30 tablet 2   loperamide (IMODIUM) 2 MG capsule Take by mouth as needed for diarrhea or loose stools.     naproxen (NAPROSYN) 500 MG tablet Take 500 mg by mouth 2 (two) times daily as needed for moderate pain.     oxyCODONE-acetaminophen (PERCOCET/ROXICET) 5-325 MG tablet Take 1 tablet by mouth every 8 (eight) hours as needed for severe pain. 30 tablet 0   prochlorperazine (COMPAZINE) 10 MG tablet Take 1 tablet (10 mg total) by mouth every 6 (six) hours as needed for nausea or vomiting. 30 tablet 2   tadalafil (CIALIS) 5 MG tablet Take 5 mg by mouth daily.     tamsulosin (FLOMAX)  0.4 MG CAPS capsule Take 0.4 mg by mouth daily.     No current facility-administered medications for this visit.    SURGICAL HISTORY:  Past Surgical History:  Procedure Laterality Date   COLONOSCOPY N/A 09/02/2016   Procedure: COLONOSCOPY;  Surgeon: Rogene Houston, MD;  Location: [facility];  Service: Endoscopy;  Laterality: N/A;  830   NO PAST SURGERIES     POLYPECTOMY  09/02/2016    Procedure: POLYPECTOMY;  Surgeon: Rogene Houston, MD;  Location: [facility];  Service: Endoscopy;;    REVIEW OF SYSTEMS:   Review of Systems  Constitutional: Positive for stable fatigue. Negative for appetite change, chills, fever and unexpected weight change.  HENT: Negative for mouth sores, nosebleeds, sore throat and trouble swallowing.   Eyes: Negative for eye problems and icterus.  Respiratory: Positive for stable/slightly improved shortness of breath with exertion. Negative for hemoptysis, cough and wheezing.  Cardiovascular: Negative for chest pain and leg swelling.  Gastrointestinal: Negative for abdominal pain, constipation, diarrhea, nausea and vomiting.  Genitourinary: Negative for bladder incontinence, difficulty urinating, dysuria, frequency and hematuria.   Musculoskeletal: Negative for back pain, gait problem, neck pain and neck stiffness.  Skin: Positive for hypopigmentation on face/vitiligo .  Neurological: Negative for dizziness, extremity weakness, gait problem, headaches, light-headedness and seizures.  Hematological: Negative for adenopathy. Does not bruise/bleed easily.  Psychiatric/Behavioral: Negative for confusion, depression and sleep disturbance. The patient is not nervous/anxious.     PHYSICAL EXAMINATION:  Blood pressure (!) 146/71, pulse 61, temperature 98.8 F (37.1 C), temperature source Tympanic, resp. rate 16, height _0  (1.88 m), weight 155 lb 9.6 oz (70.6 kg), SpO2 94 %.  ECOG PERFORMANCE STATUS: 1-2  Physical Exam  Constitutional: Oriented to person, place, and time and thin appearing male and in no distress.  HENT:  Head: Normocephalic and atraumatic.  Mouth/Throat: Oropharynx is clear and moist. No oropharyngeal exudate.  No evidence of thrush Eyes: Conjunctivae are normal. Right eye exhibits no discharge. Left eye exhibits no discharge. No scleral icterus.  Neck: Normal range of motion. Neck supple.  Cardiovascular: Normal rate, regular  rhythm, normal heart sounds and intact distal pulses.   Pulmonary/Chest: Effort normal and breath sounds normal. No respiratory distress. No wheezes. No rales.  Abdominal: Soft. Bowel sounds are normal. Exhibits no distension and no mass. There is no tenderness.  Musculoskeletal: Normal range of motion. Exhibits no edema.  Lymphadenopathy:    No cervical adenopathy.  Neurological: Alert and oriented to person, place, and time. Exhibits muscle wasting.  The patient was examined in the wheelchair. Skin: Hypopigmentation on his face and upper torso and hands. Skin is warm and dry. Not diaphoretic. No erythema. No pallor.  Psychiatric: Mood, memory and judgment normal.  Vitals reviewed.    LABORATORY DATA: Lab Results  Component Value Date   WBC 3.3 (L) 05/25/2022   HGB 10.1 (L) 05/25/2022   HCT 30.2 (L) 05/25/2022   MCV 89.9 05/25/2022   PLT 159 05/25/2022      Chemistry      Component Value Date/Time   NA 138 05/04/2022 0807   K 4.3 05/04/2022 0807   CL 109 05/04/2022 0807   CO2 24 05/04/2022 0807   BUN 32 (H) 05/04/2022 0807   CREATININE 2.53 (H) 05/04/2022 0807      Component Value Date/Time   CALCIUM 8.4 (L) 05/04/2022 0807   ALKPHOS 69 05/04/2022 0807   AST 10 (L) 05/04/2022 0807   ALT 9 05/04/2022 0807  BILITOT 0.3 05/04/2022 4081       RADIOGRAPHIC STUDIES:  CT Abdomen Pelvis Wo Contrast  Result Date: 05/22/2022 CLINICAL DATA:  Staging of non-small cell lung cancer in this 88 year old male. * Tracking Code: BO * EXAM: CT CHEST, ABDOMEN AND PELVIS WITHOUT CONTRAST TECHNIQUE: Multidetector CT imaging of the chest, abdomen and pelvis was performed following the standard protocol without IV contrast. RADIATION DOSE REDUCTION: This exam was performed according to the departmental dose-optimization program which includes automated exposure control, adjustment of the mA and/or kV according to patient size and/or use of iterative reconstruction technique. COMPARISON:  March 09, 2022 FINDINGS: CT CHEST FINDINGS Cardiovascular: Calcified aortic atherosclerotic changes. Normal heart size without pericardial effusion or nodularity. Central pulmonary vasculature and aortic caliber stable. Limited assessment of cardiovascular structures given lack of intravenous contrast. Mediastinum/Nodes: Thyroid enlargement similar to prior studies. No signs of adenopathy in the chest. Esophagus grossly normal. Distortion of the RIGHT hilum similar to prior imaging, see below. Lungs/Pleura: RIGHT hilar distortion and bandlike consolidative changes extending to the periphery of the RIGHT upper lobe with lenticular pleural based density measuring 5.3 x 1.5 cm (image 20/2) this area inseparable from the adjacent chest wall is stable as compared to the prior exam. Associated fibrotic changes and bronchiectasis related to post treatment changes are similarly stable. Background pulmonary emphysema. No new nodules or acute process in the chest. Musculoskeletal: See below for full musculoskeletal details. CT ABDOMEN PELVIS FINDINGS Hepatobiliary: Liver with smooth contours and without visible lesion on noncontrast imaging. Pancreas: Normal contours, no signs of inflammation. Spleen: Subtle low-density in the peripheral spleen (image 56/2) present as far back as January 2022 and not displaying increased metabolic activity at this time on more remote PET exam. Adrenals/Urinary Tract: Adrenal glands are normal. Low-density renal lesions compatible with bilateral renal cysts. No additional dedicated imaging follow-up is recommended for these findings. No hydronephrosis. No perinephric stranding. Urinary bladder with mild thickening which is stable and nonfocal and without perivesical stranding. Stomach/Bowel: No signs of bowel obstruction. Query mild haustral thickening of haustra in the transverse colon. No surrounding stranding. Paucity of mesenteric fat limiting assessment of individual bowel loops. Contrast in  the gastrointestinal tract passes into the colon and is mixed with stool. Vascular/Lymphatic: Calcified aortic atherosclerotic changes without aneurysmal dilation of the abdominal aorta. Smooth contour of the IVC. There is no gastrohepatic or hepatoduodenal ligament lymphadenopathy. No retroperitoneal or mesenteric lymphadenopathy. No pelvic sidewall lymphadenopathy. Reproductive: Fiducial markers in the prostate. Other: No ascites Musculoskeletal: No chest or body wall mass. Osteopenia. Spinal degenerative changes. RIGHT-sided ribs with destructive changes unchanged and related to prior tumor involvement and or radiotherapy. IMPRESSION: 1. Stable post treatment related changes in the RIGHT chest without new or progressive findings. 2. No evidence of metastatic disease to the chest, abdomen or pelvis. 3. Query mild haustra thickening of the transverse colon. No surrounding stranding. Correlate with any abdominal symptoms. 4. Subtle low-density in the peripheral spleen is present as far back as January 2022 and not displaying increased metabolic activity at this time on more remote PET exam. This is likely benign. 5. Circumferential bladder wall thickening is unchanged. 6. Pulmonary emphysema and aortic atherosclerosis. 7. Fiducial markers in the prostate. Aortic Atherosclerosis (ICD10-I70.0) and Emphysema (ICD10-J43.9). Electronically Signed   By: Zetta Bills M.D.   On: 05/22/2022 15:27   CT Chest Wo Contrast  Result Date: 05/22/2022 CLINICAL DATA:  Staging of non-small cell lung cancer in this 88 year old male. * Tracking Code: BO *  EXAM: CT CHEST, ABDOMEN AND PELVIS WITHOUT CONTRAST TECHNIQUE: Multidetector CT imaging of the chest, abdomen and pelvis was performed following the standard protocol without IV contrast. RADIATION DOSE REDUCTION: This exam was performed according to the departmental dose-optimization program which includes automated exposure control, adjustment of the mA and/or kV according to  patient size and/or use of iterative reconstruction technique. COMPARISON:  March 09, 2022 FINDINGS: CT CHEST FINDINGS Cardiovascular: Calcified aortic atherosclerotic changes. Normal heart size without pericardial effusion or nodularity. Central pulmonary vasculature and aortic caliber stable. Limited assessment of cardiovascular structures given lack of intravenous contrast. Mediastinum/Nodes: Thyroid enlargement similar to prior studies. No signs of adenopathy in the chest. Esophagus grossly normal. Distortion of the RIGHT hilum similar to prior imaging, see below. Lungs/Pleura: RIGHT hilar distortion and bandlike consolidative changes extending to the periphery of the RIGHT upper lobe with lenticular pleural based density measuring 5.3 x 1.5 cm (image 20/2) this area inseparable from the adjacent chest wall is stable as compared to the prior exam. Associated fibrotic changes and bronchiectasis related to post treatment changes are similarly stable. Background pulmonary emphysema. No new nodules or acute process in the chest. Musculoskeletal: See below for full musculoskeletal details. CT ABDOMEN PELVIS FINDINGS Hepatobiliary: Liver with smooth contours and without visible lesion on noncontrast imaging. Pancreas: Normal contours, no signs of inflammation. Spleen: Subtle low-density in the peripheral spleen (image 56/2) present as far back as January 2022 and not displaying increased metabolic activity at this time on more remote PET exam. Adrenals/Urinary Tract: Adrenal glands are normal. Low-density renal lesions compatible with bilateral renal cysts. No additional dedicated imaging follow-up is recommended for these findings. No hydronephrosis. No perinephric stranding. Urinary bladder with mild thickening which is stable and nonfocal and without perivesical stranding. Stomach/Bowel: No signs of bowel obstruction. Query mild haustral thickening of haustra in the transverse colon. No surrounding stranding.  Paucity of mesenteric fat limiting assessment of individual bowel loops. Contrast in the gastrointestinal tract passes into the colon and is mixed with stool. Vascular/Lymphatic: Calcified aortic atherosclerotic changes without aneurysmal dilation of the abdominal aorta. Smooth contour of the IVC. There is no gastrohepatic or hepatoduodenal ligament lymphadenopathy. No retroperitoneal or mesenteric lymphadenopathy. No pelvic sidewall lymphadenopathy. Reproductive: Fiducial markers in the prostate. Other: No ascites Musculoskeletal: No chest or body wall mass. Osteopenia. Spinal degenerative changes. RIGHT-sided ribs with destructive changes unchanged and related to prior tumor involvement and or radiotherapy. IMPRESSION: 1. Stable post treatment related changes in the RIGHT chest without new or progressive findings. 2. No evidence of metastatic disease to the chest, abdomen or pelvis. 3. Query mild haustra thickening of the transverse colon. No surrounding stranding. Correlate with any abdominal symptoms. 4. Subtle low-density in the peripheral spleen is present as far back as January 2022 and not displaying increased metabolic activity at this time on more remote PET exam. This is likely benign. 5. Circumferential bladder wall thickening is unchanged. 6. Pulmonary emphysema and aortic atherosclerosis. 7. Fiducial markers in the prostate. Aortic Atherosclerosis (ICD10-I70.0) and Emphysema (ICD10-J43.9). Electronically Signed   By: Zetta Bills M.D.   On: 05/22/2022 15:27     ASSESSMENT/PLAN:  This is a very pleasant 88 year old African-American male diagnosed with a stage IVa (T4, N3, M1 B) non-small cell lung cancer, adenosquamous carcinoma in March 2022 and presented with large mass involving the lateral right upper lobe and right chest wall soft tissue in addition to right hilar, mediastinal and right subpectoral and supraclavicular lymphadenopathy with PD-L1 expression of 90%.  The patient is status  post a course of concurrent chemoradiation with carboplatin for an AUC of 2 and paclitaxel 45 mg per metered square.  He is status post 6 cycles.    The patient had evidence of disease progression following concurrent chemoradiation.   The patient is currently undergoing single agent immunotherapy with Libtayo IV every 3 weeks due to his PD-L1 expression of 90%.  The patient is status post 20 cycles.  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  He was seen by Dr. Denna Haggard by dermatology.  The patient recently had a restaging CT scan performed.  Dr. Julien Nordmann personally independently reviewed the scan discussed results with the patient today.  The scan showed no evidence of disease. The scan incidentally noted Query mild haustra thickening of the transverse colon. The patient denies any abdominal pain, changes in bowel habits, nausea, vomiting, or blood in the stool. Cautioned if he develops new or worsening symptoms to call the clinic to be re-evaluated. Dr. Julien Nordmann recommends that the patient continue on the same treatment the same dose.  He will proceed with cycle #21 today as scheduled.  We will see him back for follow-up visit in 3 weeks for evaluation and repeat blood work before starting cycle number 22. He has baseline CKD. His creatinine is 2.10 today. Ok to proceed with treatment as scheduled with his creatinine.   We have previously had a discussion about his vitiligo and his immunotherapy and the decision to continue. Given his age and CKD, the only other treatment options would be chemotherapy, which would likely be challenging for this patient. Therefore, he opted to continue on his current treatment.   The patient was advised to call immediately if he has any concerning symptoms in the interval. The patient voices understanding of current disease status and treatment options and is in agreement with the current care plan. All  questions were answered. The patient knows to call the clinic with any problems, questions or concerns. We can certainly see the patient much sooner if necessary          Orders Placed This Encounter  Procedures   CBC with Differential ([facility] Only)    Standing Status:   Future    Standing Expiration Date:   05/26/2023   CMP ([facility] only)    Standing Status:   Future    Standing Expiration Date:   05/26/2023   T4    Standing Status:   Future    Standing Expiration Date:   05/26/2023   TSH    Standing Status:   Future    Standing Expiration Date:   05/26/2023   CBC with Differential ([facility] Only)    Standing Status:   Future    Standing Expiration Date:   06/16/2023   CMP ([facility] only)    Standing Status:   Future    Standing Expiration Date:   06/16/2023   T4    Standing Status:   Future    Standing Expiration Date:   06/16/2023   TSH    Standing Status:   Future    Standing Expiration Date:   06/16/2023   CBC with Differential ([facility] Only)    Standing Status:   Future    Standing Expiration Date:   07/07/2023   CMP ([place] [facility] [place] only)    Standing Status:   Future    Standing Expiration Date:   07/07/2023   T4  Standing Status:   Future    Standing Expiration Date:   07/07/2023   TSH    Standing Status:   Future    Standing Expiration Date:   07/07/2023   CBC with Differential (Cancer Center Only)    Standing Status:   Future    Standing Expiration Date:   07/28/2023   CMP ([facility] only)    Standing Status:   Future    Standing Expiration Date:   07/28/2023   T4    Standing Status:   Future    Standing Expiration Date:   07/28/2023   TSH    Standing Status:   Future    Standing Expiration Date:   07/28/2023      Tobe Sos Ahnaf Caponi, PA-C 05/25/22  ADDENDUM: Hematology/Oncology Attending: I had a face-to-face encounter with the patient today.  I reviewed his record, lab, scan and recommended his care plan.  This  is a very pleasant 88 years old African-American male with stage IV non-small cell lung cancer, adenosquamous carcinoma diagnosed in March 2022 with PD-L1 expression of 90%.  The patient is status post a course of concurrent chemoradiation with weekly carboplatin and paclitaxel with evidence for disease progression after the treatment. The patient started treatment with immunotherapy with Libtayo (Cempilimab) 350 Mg IV every 3 weeks status post 20 cycles.  He has been tolerating his treatment well with no concerning complaints except for the immunotherapy mediated vitiligo.  He is followed by dermatology. He had repeat CT scan of the chest, abdomen and pelvis performed recently.  I personally and independently reviewed the scan and discussed the result with the patient and his son-in-law. His scan showed no concerning findings for disease progression. I recommended for him to continue his treatment with Libtayo (Cempilimab) and he will proceed with cycle #21 today as planned. I will see him back for follow-up visit in 3 weeks for evaluation with the next cycle of his treatment. The patient was advised to call immediately if he has any other concerning symptoms in the interval.  The total time spent in the appointment was 30 minutes. Disclaimer: This note was dictated with voice recognition software. Similar sounding words can inadvertently be transcribed and may be missed upon review. Eilleen Kempf, MD 05/25/22

## 2022-05-21 ENCOUNTER — Ambulatory Visit (HOSPITAL_COMMUNITY)
Admission: RE | Admit: 2022-05-21 | Discharge: 2022-05-21 | Disposition: A | Discharge status: Home / Self Care | Payer: Medicare HMO | Source: Ambulatory Visit | Attending: Internal Medicine | Admitting: Internal Medicine

## 2022-05-21 ENCOUNTER — Encounter (HOSPITAL_COMMUNITY): Payer: Self-pay

## 2022-05-21 DIAGNOSIS — C3490 Malignant neoplasm of unspecified part of unspecified bronchus or lung: Secondary | ICD-10-CM

## 2022-05-21 DIAGNOSIS — J439 Emphysema, unspecified: Secondary | ICD-10-CM | POA: Diagnosis not present

## 2022-05-21 DIAGNOSIS — J479 Bronchiectasis, uncomplicated: Secondary | ICD-10-CM | POA: Diagnosis not present

## 2022-05-25 ENCOUNTER — Inpatient Hospital Stay: Payer: Medicare HMO

## 2022-05-25 ENCOUNTER — Other Ambulatory Visit: Payer: Self-pay

## 2022-05-25 ENCOUNTER — Inpatient Hospital Stay (HOSPITAL_BASED_OUTPATIENT_CLINIC_OR_DEPARTMENT_OTHER): Payer: Medicare HMO | Admitting: Physician Assistant

## 2022-05-25 VITALS — BP 144/58 | HR 63 | Resp 18

## 2022-05-25 VITALS — BP 146/71 | HR 61 | Temp 98.8°F | Resp 16 | Ht 74.0 in | Wt 155.6 lb

## 2022-05-25 DIAGNOSIS — Z5112 Encounter for antineoplastic immunotherapy: Secondary | ICD-10-CM | POA: Diagnosis not present

## 2022-05-25 DIAGNOSIS — C3411 Malignant neoplasm of upper lobe, right bronchus or lung: Secondary | ICD-10-CM

## 2022-05-25 DIAGNOSIS — Z79899 Other long term (current) drug therapy: Secondary | ICD-10-CM | POA: Diagnosis not present

## 2022-05-25 LAB — CMP (CANCER CENTER ONLY)
ALT: 9 U/L (ref 0–44)
AST: 12 U/L — ABNORMAL LOW (ref 15–41)
Albumin: 3.4 g/dL — ABNORMAL LOW (ref 3.5–5.0)
Alkaline Phosphatase: 72 U/L (ref 38–126)
Anion gap: 5 (ref 5–15)
BUN: 34 mg/dL — ABNORMAL HIGH (ref 8–23)
CO2: 24 mmol/L (ref 22–32)
Calcium: 9 mg/dL (ref 8.9–10.3)
Chloride: 110 mmol/L (ref 98–111)
Creatinine: 2.1 mg/dL — ABNORMAL HIGH (ref 0.61–1.24)
GFR, Estimated: 30 mL/min — ABNORMAL LOW (ref >60)
Glucose, Bld: 92 mg/dL (ref 70–99)
Potassium: 4.1 mmol/L (ref 3.5–5.1)
Sodium: 139 mmol/L (ref 135–145)
Total Bilirubin: 0.4 mg/dL (ref 0.3–1.2)
Total Protein: 7 g/dL (ref 6.5–8.1)

## 2022-05-25 LAB — CBC WITH DIFFERENTIAL (CANCER CENTER ONLY)
Abs Immature Granulocytes: 0 10*3/uL (ref 0.00–0.07)
Basophils Absolute: 0 10*3/uL (ref 0.0–0.1)
Basophils Relative: 0 %
Eosinophils Absolute: 0.1 10*3/uL (ref 0.0–0.5)
Eosinophils Relative: 4 %
HCT: 30.2 % — ABNORMAL LOW (ref 39.0–52.0)
Hemoglobin: 10.1 g/dL — ABNORMAL LOW (ref 13.0–17.0)
Immature Granulocytes: 0 %
Lymphocytes Relative: 25 %
Lymphs Abs: 0.8 10*3/uL (ref 0.7–4.0)
MCH: 30.1 pg (ref 26.0–34.0)
MCHC: 33.4 g/dL (ref 30.0–36.0)
MCV: 89.9 fL (ref 80.0–100.0)
Monocytes Absolute: 0.3 10*3/uL (ref 0.1–1.0)
Monocytes Relative: 9 %
Neutro Abs: 2.1 10*3/uL (ref 1.7–7.7)
Neutrophils Relative %: 62 %
Platelet Count: 159 10*3/uL (ref 150–400)
RBC: 3.36 MIL/uL — ABNORMAL LOW (ref 4.22–5.81)
RDW: 16.1 % — ABNORMAL HIGH (ref 11.5–15.5)
WBC Count: 3.3 10*3/uL — ABNORMAL LOW (ref 4.0–10.5)
nRBC: 0 % (ref 0.0–0.2)

## 2022-05-25 LAB — TSH: TSH: 0.67 u[IU]/mL (ref 0.350–4.500)

## 2022-05-25 MED ORDER — SODIUM CHLORIDE 0.9 % IV SOLN
350.0000 mg | Freq: Once | INTRAVENOUS | Status: AC
  Administered 2022-05-25: 350 mg via INTRAVENOUS
  Filled 2022-05-25: qty 7

## 2022-05-25 MED ORDER — SODIUM CHLORIDE 0.9 % IV SOLN: Freq: Once | INTRAVENOUS | Status: AC

## 2022-05-25 NOTE — Patient Instructions (Signed)
[facility]  Discharge Instructions: Thank you for choosing [facility] to provide your oncology and hematology care.   If you have a lab appointment with the [facility], please go directly to the [facility] and check in at the registration area.   Wear comfortable clothing and clothing appropriate for easy access to any Portacath or PICC line.   We strive to give you quality time with your provider. You may need to reschedule your appointment if you arrive late (15 or more minutes).  Arriving late affects you and other patients whose appointments are after yours.  Also, if you miss three or more appointments without notifying the office, you may be dismissed from the clinic at the provider's discretion.      For prescription refill requests, have your pharmacy contact our office and allow 72 hours for refills to be completed.    Today you received the following chemotherapy and/or immunotherapy agents: Cemiplimab.       To help prevent nausea and vomiting after your treatment, we encourage you to take your nausea medication as directed.  BELOW ARE SYMPTOMS THAT SHOULD BE REPORTED IMMEDIATELY: *FEVER GREATER THAN 100.4 F (38 C) OR HIGHER *CHILLS OR SWEATING *NAUSEA AND VOMITING THAT IS NOT CONTROLLED WITH YOUR NAUSEA MEDICATION *UNUSUAL SHORTNESS OF BREATH *UNUSUAL BRUISING OR BLEEDING *URINARY PROBLEMS (pain or burning when urinating, or frequent urination) *BOWEL PROBLEMS (unusual diarrhea, constipation, pain near the anus) TENDERNESS IN MOUTH AND THROAT WITH OR WITHOUT PRESENCE OF ULCERS (sore throat, sores in mouth, or a toothache) UNUSUAL RASH, SWELLING OR PAIN  UNUSUAL VAGINAL DISCHARGE OR ITCHING   Items with * indicate a potential emergency and should be followed up as soon as possible or go to the Emergency Department if any problems should occur.  Please show the CHEMOTHERAPY ALERT CARD or IMMUNOTHERAPY ALERT CARD at check-in  to the Emergency Department and triage nurse.  Should you have questions after your visit or need to cancel or reschedule your appointment, please contact [facility]  Dept: [phone]  and follow the prompts.  Office hours are 8:00 a.m. to 4:30 p.m. Monday - Friday. Please note that voicemails left after 4:00 p.m. may not be returned until the following business day.  We are closed weekends and major holidays. You have access to a nurse at all times for urgent questions. Please call the main number to the clinic Dept: [phone] and follow the prompts.   For any non-urgent questions, you may also contact your provider using MyChart. We now offer e-Visits for anyone 54 and older to request care online for non-urgent symptoms. For details visit [email or web address].   Also download the MyChart app! Go to the app store, search "MyChart", open the app, select [facility], and log in with your MyChart username and password.  Masks are optional in the cancer centers. If you would like for your care team to wear a mask while they are taking care of you, please let them know. You may have one support person who is at least 86 years old accompany you for your appointments.

## 2022-05-29 ENCOUNTER — Other Ambulatory Visit: Payer: Self-pay

## 2022-06-03 ENCOUNTER — Ambulatory Visit: Payer: Medicare HMO

## 2022-06-03 ENCOUNTER — Ambulatory Visit: Payer: Medicare HMO | Admitting: Physician Assistant

## 2022-06-03 ENCOUNTER — Other Ambulatory Visit: Payer: Medicare HMO

## 2022-06-15 ENCOUNTER — Inpatient Hospital Stay (HOSPITAL_BASED_OUTPATIENT_CLINIC_OR_DEPARTMENT_OTHER): Payer: Medicare HMO | Admitting: Internal Medicine

## 2022-06-15 ENCOUNTER — Inpatient Hospital Stay: Payer: Medicare HMO | Attending: Physician Assistant

## 2022-06-15 ENCOUNTER — Encounter: Payer: Self-pay | Admitting: Internal Medicine

## 2022-06-15 ENCOUNTER — Inpatient Hospital Stay: Payer: Medicare HMO

## 2022-06-15 ENCOUNTER — Other Ambulatory Visit: Payer: Self-pay

## 2022-06-15 VITALS — BP 132/68 | HR 56 | Temp 98.0°F | Resp 15 | Wt 152.7 lb

## 2022-06-15 VITALS — BP 143/64 | HR 61 | Temp 97.9°F | Resp 16

## 2022-06-15 DIAGNOSIS — C3411 Malignant neoplasm of upper lobe, right bronchus or lung: Secondary | ICD-10-CM | POA: Diagnosis not present

## 2022-06-15 DIAGNOSIS — Z79899 Other long term (current) drug therapy: Secondary | ICD-10-CM | POA: Insufficient documentation

## 2022-06-15 DIAGNOSIS — Z5112 Encounter for antineoplastic immunotherapy: Secondary | ICD-10-CM | POA: Diagnosis not present

## 2022-06-15 DIAGNOSIS — C7989 Secondary malignant neoplasm of other specified sites: Secondary | ICD-10-CM | POA: Diagnosis not present

## 2022-06-15 LAB — CBC WITH DIFFERENTIAL (CANCER CENTER ONLY)
Abs Immature Granulocytes: 0.02 10*3/uL (ref 0.00–0.07)
Basophils Absolute: 0 10*3/uL (ref 0.0–0.1)
Basophils Relative: 0 %
Eosinophils Absolute: 0.1 10*3/uL (ref 0.0–0.5)
Eosinophils Relative: 3 %
HCT: 32.8 % — ABNORMAL LOW (ref 39.0–52.0)
Hemoglobin: 10.8 g/dL — ABNORMAL LOW (ref 13.0–17.0)
Immature Granulocytes: 1 %
Lymphocytes Relative: 25 %
Lymphs Abs: 0.9 10*3/uL (ref 0.7–4.0)
MCH: 29.9 pg (ref 26.0–34.0)
MCHC: 32.9 g/dL (ref 30.0–36.0)
MCV: 90.9 fL (ref 80.0–100.0)
Monocytes Absolute: 0.3 10*3/uL (ref 0.1–1.0)
Monocytes Relative: 8 %
Neutro Abs: 2.2 10*3/uL (ref 1.7–7.7)
Neutrophils Relative %: 63 %
Platelet Count: 156 10*3/uL (ref 150–400)
RBC: 3.61 MIL/uL — ABNORMAL LOW (ref 4.22–5.81)
RDW: 15.9 % — ABNORMAL HIGH (ref 11.5–15.5)
WBC Count: 3.5 10*3/uL — ABNORMAL LOW (ref 4.0–10.5)
nRBC: 0 % (ref 0.0–0.2)

## 2022-06-15 LAB — CMP (CANCER CENTER ONLY)
ALT: 12 U/L (ref 0–44)
AST: 14 U/L — ABNORMAL LOW (ref 15–41)
Albumin: 3.3 g/dL — ABNORMAL LOW (ref 3.5–5.0)
Alkaline Phosphatase: 78 U/L (ref 38–126)
Anion gap: 9 (ref 5–15)
BUN: 31 mg/dL — ABNORMAL HIGH (ref 8–23)
CO2: 22 mmol/L (ref 22–32)
Calcium: 8.7 mg/dL — ABNORMAL LOW (ref 8.9–10.3)
Chloride: 106 mmol/L (ref 98–111)
Creatinine: 2.25 mg/dL — ABNORMAL HIGH (ref 0.61–1.24)
GFR, Estimated: 28 mL/min — ABNORMAL LOW (ref >60)
Glucose, Bld: 99 mg/dL (ref 70–99)
Potassium: 4.3 mmol/L (ref 3.5–5.1)
Sodium: 137 mmol/L (ref 135–145)
Total Bilirubin: 0.4 mg/dL (ref 0.3–1.2)
Total Protein: 7.3 g/dL (ref 6.5–8.1)

## 2022-06-15 LAB — TSH: TSH: 1.656 u[IU]/mL (ref 0.350–4.500)

## 2022-06-15 MED ORDER — SODIUM CHLORIDE 0.9 % IV SOLN
350.0000 mg | Freq: Once | INTRAVENOUS | Status: AC
  Administered 2022-06-15: 350 mg via INTRAVENOUS
  Filled 2022-06-15: qty 7

## 2022-06-15 MED ORDER — SODIUM CHLORIDE 0.9 % IV SOLN: Freq: Once | INTRAVENOUS | Status: AC

## 2022-06-15 NOTE — Patient Instructions (Signed)
[facility]  Discharge Instructions: Thank you for choosing [facility] to provide your oncology and hematology care.   If you have a lab appointment with the [facility], please go directly to the [facility] and check in at the registration area.   Wear comfortable clothing and clothing appropriate for easy access to any Portacath or PICC line.   We strive to give you quality time with your provider. You may need to reschedule your appointment if you arrive late (15 or more minutes).  Arriving late affects you and other patients whose appointments are after yours.  Also, if you miss three or more appointments without notifying the office, you may be dismissed from the clinic at the provider's discretion.      For prescription refill requests, have your pharmacy contact our office and allow 72 hours for refills to be completed.    Today you received the following chemotherapy and/or immunotherapy agents: Cemiplimab.       To help prevent nausea and vomiting after your treatment, we encourage you to take your nausea medication as directed.  BELOW ARE SYMPTOMS THAT SHOULD BE REPORTED IMMEDIATELY: *FEVER GREATER THAN 100.4 F (38 C) OR HIGHER *CHILLS OR SWEATING *NAUSEA AND VOMITING THAT IS NOT CONTROLLED WITH YOUR NAUSEA MEDICATION *UNUSUAL SHORTNESS OF BREATH *UNUSUAL BRUISING OR BLEEDING *URINARY PROBLEMS (pain or burning when urinating, or frequent urination) *BOWEL PROBLEMS (unusual diarrhea, constipation, pain near the anus) TENDERNESS IN MOUTH AND THROAT WITH OR WITHOUT PRESENCE OF ULCERS (sore throat, sores in mouth, or a toothache) UNUSUAL RASH, SWELLING OR PAIN  UNUSUAL VAGINAL DISCHARGE OR ITCHING   Items with * indicate a potential emergency and should be followed up as soon as possible or go to the Emergency Department if any problems should occur.  Please show the CHEMOTHERAPY ALERT CARD or IMMUNOTHERAPY ALERT CARD at check-in  to the Emergency Department and triage nurse.  Should you have questions after your visit or need to cancel or reschedule your appointment, please contact [facility]  Dept: [phone]  and follow the prompts.  Office hours are 8:00 a.m. to 4:30 p.m. Monday - Friday. Please note that voicemails left after 4:00 p.m. may not be returned until the following business day.  We are closed weekends and major holidays. You have access to a nurse at all times for urgent questions. Please call the main number to the clinic Dept: [phone] and follow the prompts.   For any non-urgent questions, you may also contact your provider using MyChart. We now offer e-Visits for anyone 70 and older to request care online for non-urgent symptoms. For details visit [email or web address].   Also download the MyChart app! Go to the app store, search "MyChart", open the app, select [facility], and log in with your MyChart username and password.  Masks are optional in the cancer centers. If you would like for your care team to wear a mask while they are taking care of you, please let them know. You may have one support person who is at least 86 years old accompany you for your appointments.

## 2022-06-15 NOTE — Progress Notes (Signed)
Per Dr. Julien Nordmann , it is ok to treat pt today with  Libtayo and creatinine of 2.25.

## 2022-06-15 NOTE — Progress Notes (Signed)
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  Biomarker Findings Tumor Mutational Burden - 10 Muts/Mb Microsatellite status - MS-Stable Genomic Findings For a complete list of the genes assayed, please refer to the Appendix. KRAS G12C NFKBIA amplification NKX2-1 amplification RBM10 D654fs*80 7 Disease relevant genes with no reportable alterations: ALK, BRAF, EGFR, ERBB2, MET, RET, ROS1   PDL1: 90%   PRIOR THERAPY:  Weekly concurrent chemoradiation with carboplatin for an AUC of 2 and paclitaxel 45 mg/m.  First dose on 12/30/2020.  Status post 6 cycles.   CURRENT THERAPY: First-line treatment with immunotherapy with Libtayo (Cempilimab) 350 Mg IV every 3 weeks.  Status post 21 cycles.  INTERVAL HISTORY: [name] 88 y.o. male returns to the clinic today for follow-up visit accompanied by his son-in-law.  The patient is feeling fine today with no concerning complaints except for the persistent immunotherapy mediated vitiligo.  He denied having any current chest pain, shortness of breath, cough or hemoptysis.  He has no nausea, vomiting, diarrhea or constipation.  He has no headache or visual changes.  He denied having any recent fever or chills.  He lost around 4 pounds since his last visit.  He is here today for evaluation before starting cycle #22 of his treatment.  MEDICAL HISTORY: Past Medical History:  Diagnosis Date   Arthritis    Hypertension    Hyperthyroidism    lung ca 11/2020    ALLERGIES:  has No Known Allergies.  MEDICATIONS:  Current Outpatient  Medications  Medication Sig Dispense Refill   amLODipine (NORVASC) 10 MG tablet Take 10 mg by mouth daily.     gabapentin (NEURONTIN) 250 MG/5ML solution Take 3 mLs (150 mg total) by mouth at bedtime. 90 mL 2   hydrochlorothiazide (MICROZIDE) 12.5 MG capsule Take 12.5 mg by mouth daily.     labetalol (NORMODYNE) 200 MG tablet Take 200 mg by mouth 2 (two) times daily.     levothyroxine (SYNTHROID) 175 MCG tablet Take 1 tablet (175 mcg total) by mouth daily before breakfast. 30 tablet 2   loperamide (IMODIUM) 2 MG capsule Take by mouth as needed for diarrhea or loose stools.     naproxen (NAPROSYN) 500 MG tablet Take 500 mg by mouth 2 (two) times daily as needed for moderate pain.     oxyCODONE-acetaminophen (PERCOCET/ROXICET) 5-325 MG tablet Take 1 tablet by mouth every 8 (eight) hours as needed for severe pain. 30 tablet 0   prochlorperazine (COMPAZINE) 10 MG tablet Take 1 tablet (10 mg total) by mouth every 6 (six) hours as needed for nausea or vomiting. 30 tablet 2   tadalafil (CIALIS) 5 MG tablet Take 5 mg by mouth daily.     tamsulosin (FLOMAX) 0.4 MG CAPS capsule Take 0.4 mg by mouth daily.     No current facility-administered medications for this visit.    SURGICAL HISTORY:  Past Surgical History:  Procedure Laterality Date   COLONOSCOPY N/A 09/02/2016   Procedure: COLONOSCOPY;  Surgeon: Rogene Houston, MD;  Location: [facility];  Service: Endoscopy;  Laterality: N/A;  830   NO PAST  SURGERIES     POLYPECTOMY  09/02/2016   Procedure: POLYPECTOMY;  Surgeon: Rogene Houston, MD;  Location: [facility];  Service: Endoscopy;;    REVIEW OF SYSTEMS:  A comprehensive review of systems was negative except for: Constitutional: positive for fatigue Integument/breast: positive for skin color change   PHYSICAL EXAMINATION: General appearance: alert, cooperative, and no distress Head: Normocephalic, without obvious abnormality, atraumatic Neck: no adenopathy, no JVD, supple, symmetrical,  trachea midline, and thyroid not enlarged, symmetric, no tenderness/mass/nodules Lymph nodes: Cervical, supraclavicular, and axillary nodes normal. Resp: clear to auscultation bilaterally Back: symmetric, no curvature. ROM normal. No CVA tenderness. Cardio: regular rate and rhythm, S1, S2 normal, no murmur, click, rub or gallop GI: soft, non-tender; bowel sounds normal; no masses,  no organomegaly Extremities: extremities normal, atraumatic, no cyanosis or edema  ECOG PERFORMANCE STATUS: 1 - Symptomatic but completely ambulatory  Blood pressure 132/68, pulse (!) 56, temperature 98 F (36.7 C), temperature source Oral, resp. rate 15, weight 152 lb 11.2 oz (69.3 kg), SpO2 98 %.  LABORATORY DATA: Lab Results  Component Value Date   WBC 3.3 (L) 05/25/2022   HGB 10.1 (L) 05/25/2022   HCT 30.2 (L) 05/25/2022   MCV 89.9 05/25/2022   PLT 159 05/25/2022      Chemistry      Component Value Date/Time   NA 139 05/25/2022 0907   K 4.1 05/25/2022 0907   CL 110 05/25/2022 0907   CO2 24 05/25/2022 0907   BUN 34 (H) 05/25/2022 0907   CREATININE 2.10 (H) 05/25/2022 0907      Component Value Date/Time   CALCIUM 9.0 05/25/2022 0907   ALKPHOS 72 05/25/2022 0907   AST 12 (L) 05/25/2022 0907   ALT 9 05/25/2022 0907   BILITOT 0.4 05/25/2022 8416       RADIOGRAPHIC STUDIES: CT Abdomen Pelvis Wo Contrast  Result Date: 05/22/2022 CLINICAL DATA:  Staging of non-small cell lung cancer in this 88 year old male. * Tracking Code: BO * EXAM: CT CHEST, ABDOMEN AND PELVIS WITHOUT CONTRAST TECHNIQUE: Multidetector CT imaging of the chest, abdomen and pelvis was performed following the standard protocol without IV contrast. RADIATION DOSE REDUCTION: This exam was performed according to the departmental dose-optimization program which includes automated exposure control, adjustment of the mA and/or kV according to patient size and/or use of iterative reconstruction technique. COMPARISON:  March 09, 2022  FINDINGS: CT CHEST FINDINGS Cardiovascular: Calcified aortic atherosclerotic changes. Normal heart size without pericardial effusion or nodularity. Central pulmonary vasculature and aortic caliber stable. Limited assessment of cardiovascular structures given lack of intravenous contrast. Mediastinum/Nodes: Thyroid enlargement similar to prior studies. No signs of adenopathy in the chest. Esophagus grossly normal. Distortion of the RIGHT hilum similar to prior imaging, see below. Lungs/Pleura: RIGHT hilar distortion and bandlike consolidative changes extending to the periphery of the RIGHT upper lobe with lenticular pleural based density measuring 5.3 x 1.5 cm (image 20/2) this area inseparable from the adjacent chest wall is stable as compared to the prior exam. Associated fibrotic changes and bronchiectasis related to post treatment changes are similarly stable. Background pulmonary emphysema. No new nodules or acute process in the chest. Musculoskeletal: See below for full musculoskeletal details. CT ABDOMEN PELVIS FINDINGS Hepatobiliary: Liver with smooth contours and without visible lesion on noncontrast imaging. Pancreas: Normal contours, no signs of inflammation. Spleen: Subtle low-density in the peripheral spleen (image 56/2) present as far back as January 2022 and not displaying increased metabolic activity at this time on more remote PET  exam. Adrenals/Urinary Tract: Adrenal glands are normal. Low-density renal lesions compatible with bilateral renal cysts. No additional dedicated imaging follow-up is recommended for these findings. No hydronephrosis. No perinephric stranding. Urinary bladder with mild thickening which is stable and nonfocal and without perivesical stranding. Stomach/Bowel: No signs of bowel obstruction. Query mild haustral thickening of haustra in the transverse colon. No surrounding stranding. Paucity of mesenteric fat limiting assessment of individual bowel loops. Contrast in the  gastrointestinal tract passes into the colon and is mixed with stool. Vascular/Lymphatic: Calcified aortic atherosclerotic changes without aneurysmal dilation of the abdominal aorta. Smooth contour of the IVC. There is no gastrohepatic or hepatoduodenal ligament lymphadenopathy. No retroperitoneal or mesenteric lymphadenopathy. No pelvic sidewall lymphadenopathy. Reproductive: Fiducial markers in the prostate. Other: No ascites Musculoskeletal: No chest or body wall mass. Osteopenia. Spinal degenerative changes. RIGHT-sided ribs with destructive changes unchanged and related to prior tumor involvement and or radiotherapy. IMPRESSION: 1. Stable post treatment related changes in the RIGHT chest without new or progressive findings. 2. No evidence of metastatic disease to the chest, abdomen or pelvis. 3. Query mild haustra thickening of the transverse colon. No surrounding stranding. Correlate with any abdominal symptoms. 4. Subtle low-density in the peripheral spleen is present as far back as January 2022 and not displaying increased metabolic activity at this time on more remote PET exam. This is likely benign. 5. Circumferential bladder wall thickening is unchanged. 6. Pulmonary emphysema and aortic atherosclerosis. 7. Fiducial markers in the prostate. Aortic Atherosclerosis (ICD10-I70.0) and Emphysema (ICD10-J43.9). Electronically Signed   By: Zetta Bills M.D.   On: 05/22/2022 15:27   CT Chest Wo Contrast  Result Date: 05/22/2022 CLINICAL DATA:  Staging of non-small cell lung cancer in this 88 year old male. * Tracking Code: BO * EXAM: CT CHEST, ABDOMEN AND PELVIS WITHOUT CONTRAST TECHNIQUE: Multidetector CT imaging of the chest, abdomen and pelvis was performed following the standard protocol without IV contrast. RADIATION DOSE REDUCTION: This exam was performed according to the departmental dose-optimization program which includes automated exposure control, adjustment of the mA and/or kV according to  patient size and/or use of iterative reconstruction technique. COMPARISON:  March 09, 2022 FINDINGS: CT CHEST FINDINGS Cardiovascular: Calcified aortic atherosclerotic changes. Normal heart size without pericardial effusion or nodularity. Central pulmonary vasculature and aortic caliber stable. Limited assessment of cardiovascular structures given lack of intravenous contrast. Mediastinum/Nodes: Thyroid enlargement similar to prior studies. No signs of adenopathy in the chest. Esophagus grossly normal. Distortion of the RIGHT hilum similar to prior imaging, see below. Lungs/Pleura: RIGHT hilar distortion and bandlike consolidative changes extending to the periphery of the RIGHT upper lobe with lenticular pleural based density measuring 5.3 x 1.5 cm (image 20/2) this area inseparable from the adjacent chest wall is stable as compared to the prior exam. Associated fibrotic changes and bronchiectasis related to post treatment changes are similarly stable. Background pulmonary emphysema. No new nodules or acute process in the chest. Musculoskeletal: See below for full musculoskeletal details. CT ABDOMEN PELVIS FINDINGS Hepatobiliary: Liver with smooth contours and without visible lesion on noncontrast imaging. Pancreas: Normal contours, no signs of inflammation. Spleen: Subtle low-density in the peripheral spleen (image 56/2) present as far back as January 2022 and not displaying increased metabolic activity at this time on more remote PET exam. Adrenals/Urinary Tract: Adrenal glands are normal. Low-density renal lesions compatible with bilateral renal cysts. No additional dedicated imaging follow-up is recommended for these findings. No hydronephrosis. No perinephric stranding. Urinary bladder with mild thickening which is stable and nonfocal  and without perivesical stranding. Stomach/Bowel: No signs of bowel obstruction. Query mild haustral thickening of haustra in the transverse colon. No surrounding stranding.  Paucity of mesenteric fat limiting assessment of individual bowel loops. Contrast in the gastrointestinal tract passes into the colon and is mixed with stool. Vascular/Lymphatic: Calcified aortic atherosclerotic changes without aneurysmal dilation of the abdominal aorta. Smooth contour of the IVC. There is no gastrohepatic or hepatoduodenal ligament lymphadenopathy. No retroperitoneal or mesenteric lymphadenopathy. No pelvic sidewall lymphadenopathy. Reproductive: Fiducial markers in the prostate. Other: No ascites Musculoskeletal: No chest or body wall mass. Osteopenia. Spinal degenerative changes. RIGHT-sided ribs with destructive changes unchanged and related to prior tumor involvement and or radiotherapy. IMPRESSION: 1. Stable post treatment related changes in the RIGHT chest without new or progressive findings. 2. No evidence of metastatic disease to the chest, abdomen or pelvis. 3. Query mild haustra thickening of the transverse colon. No surrounding stranding. Correlate with any abdominal symptoms. 4. Subtle low-density in the peripheral spleen is present as far back as January 2022 and not displaying increased metabolic activity at this time on more remote PET exam. This is likely benign. 5. Circumferential bladder wall thickening is unchanged. 6. Pulmonary emphysema and aortic atherosclerosis. 7. Fiducial markers in the prostate. Aortic Atherosclerosis (ICD10-I70.0) and Emphysema (ICD10-J43.9). Electronically Signed   By: Zetta Bills M.D.   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  The patient underwent  a course of concurrent chemoradiation with weekly carboplatin for AUC of 2 and paclitaxel 45 Mg/M2 status post 6 cycles. He tolerated the previous course of his concurrent chemoradiation fairly well except for fatigue. Unfortunately his CT scan of the chest after the induction phase showed interval enlargement of a large mass centered at the periphery of the right upper lobe and extending into the adjacent chest wall measuring 8.4 x 6.7 cm with bony destruction of the right third and fourth ribs substantially increased compared to the prior examination and the findings are consistent with worsened malignancy. The patient has PD-L1 expression of 90% and I felt he will be a good candidate for treatment with first-line single agent immunotherapy. I recommended for him treatment with Libtayo (Cempilimab) 350 Mg IV every 3 weeks.  He is status post 21 cycles of treatment. The patient has been tolerating this treatment well with no concerning adverse effects except for the immunotherapy mediated vitiligo. I recommended for him to proceed with cycle #22 today as planned. I will see him back for follow-up visit in 3 weeks for evaluation before the next cycle of his treatment. For the hypothyroidism, the patient will continue his current treatment with levothyroxine. The patient was advised to call immediately if he has any other concerning symptoms in the interval. The patient voices understanding of current disease status and treatment options and is in agreement with the current care plan.  All questions were answered. The patient knows to call the clinic with any problems, questions or concerns. We can certainly see the patient much sooner if necessary.   Disclaimer: This note was dictated with voice recognition software. Similar sounding words can inadvertently be transcribed and may not be  corrected upon review.

## 2022-06-16 LAB — T4: T4, Total: 7.8 ug/dL (ref 4.5–12.0)

## 2022-07-01 NOTE — Progress Notes (Signed)
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   Biomarker Findings Tumor Mutational Burden - 10 Muts/Mb Microsatellite status - MS-Stable Genomic Findings For a complete list of the genes assayed, please refer to the Appendix. KRAS G12C NFKBIA amplification NKX2-1 amplification RBM10 [id number]*80 7 Disease relevant genes with no reportable alterations: ALK, BRAF, EGFR, ERBB2, MET, RET, ROS1   PDL1: 90%  PRIOR THERAPY:  Weekly concurrent chemoradiation with carboplatin for an AUC of 2 and paclitaxel 45 mg/m.  First dose on 12/30/2020.  Status post 6 cycles  CURRENT THERAPY:  First-line treatment with immunotherapy with Libtayo (Cempilimab) 350 Mg IV every 3 weeks. First dose on 03/19/21. Status post 22 cycles.  INTERVAL HISTORY: Brian CARLILE89y.o. male returns to the clinic today for a follow-up visit accompanied by his son-in-law.  The patient is feeling fairly well today without any concern complaints. He is tolerating his treatment well except he developed immunotherapy mediated vitiligo. He feels well and denies changes in his health since last being seen. He denies any fever, chills, or night sweats.  He was previously followed by member the nutritionist team for his weight loss; however, his weight has been stable for the last few appointments. He gained about 3 lbs. He reports stable fatigue. He reports dyspnea on exertion which is stable.  He denies significant cough. Denies any chest pain or hemoptysis. Denies any headache or visual changes. He denies nausea, vomiting, diarrhea, constipation, abdominal pain, or blood in the  stool. He is here today for evaluation and repeat blood work before his next cycle of treatment with cycle #23.       MEDICAL HISTORY: Past Medical History:  Diagnosis Date   Arthritis    Hypertension    Hyperthyroidism    lung ca 11/2020    ALLERGIES:  has No Known Allergies.  MEDICATIONS:  Current Outpatient Medications  Medication Sig Dispense Refill   amLODipine (NORVASC) 10 MG tablet Take 10 mg by mouth daily.     gabapentin (NEURONTIN) 250 MG/5ML solution Take 3 mLs (150 mg total) by mouth at bedtime. 90 mL 2   hydrochlorothiazide (MICROZIDE) 12.5 MG capsule Take 12.5 mg by mouth daily.     labetalol (NORMODYNE) 200 MG tablet Take 200 mg by mouth 2 (two) times daily.     levothyroxine (SYNTHROID) 150 MCG tablet Take 150 mcg by mouth daily.     levothyroxine (SYNTHROID) 175 MCG tablet Take 1 tablet (175 mcg total) by mouth daily before breakfast. 30 tablet 2   loperamide (IMODIUM) 2 MG capsule Take by mouth as needed for diarrhea or loose stools.     naproxen (NAPROSYN) 500 MG tablet Take 500 mg by mouth 2 (two) times daily as needed for moderate pain.     oxyCODONE-acetaminophen (PERCOCET/ROXICET) 5-325 MG tablet Take 1 tablet by mouth every 8 (eight) hours as needed for severe pain. 30 tablet 0   prochlorperazine (COMPAZINE) 10 MG tablet Take 1 tablet (10 mg total) by mouth every 6 (six) hours as needed for nausea or vomiting. 30 tablet 2   tadalafil (CIALIS) 5 MG tablet Take 5 mg by mouth daily.     tamsulosin (  FLOMAX) 0.4 MG CAPS capsule Take 0.4 mg by mouth daily.     No current facility-administered medications for this visit.    SURGICAL HISTORY:  Past Surgical History:  Procedure Laterality Date   COLONOSCOPY N/A 09/02/2016   Procedure: COLONOSCOPY;  Surgeon: Rogene Houston, MD;  Location: [facility];  Service: Endoscopy;  Laterality: N/A;  830   NO PAST SURGERIES     POLYPECTOMY  09/02/2016   Procedure: POLYPECTOMY;  Surgeon: Rogene Houston, MD;  Location:  [facility];  Service: Endoscopy;;    REVIEW OF SYSTEMS:   Review of Systems  Constitutional: Positive for stable fatigue. Negative for appetite change, chills, fever and unexpected weight change.  HENT: Negative for mouth sores, nosebleeds, sore throat and trouble swallowing.   Eyes: Negative for eye problems and icterus.  Respiratory: Positive for stable shortness of breath with exertion. Negative for hemoptysis, cough and wheezing.  Cardiovascular: Negative for chest pain and leg swelling.  Gastrointestinal: Negative for abdominal pain, constipation, diarrhea, nausea and vomiting.  Genitourinary: Negative for bladder incontinence, difficulty urinating, dysuria, frequency and hematuria.   Musculoskeletal: Negative for back pain, gait problem, neck pain and neck stiffness.  Skin: Positive for hypopigmentation on face/vitiligo .  Neurological: Negative for dizziness, extremity weakness, gait problem, headaches, light-headedness and seizures.  Hematological: Negative for adenopathy. Does not bruise/bleed easily.  Psychiatric/Behavioral: Negative for confusion, depression and sleep disturbance. The patient is not nervous/anxious.       PHYSICAL EXAMINATION:  Blood pressure (!) 148/70, pulse (!) 59, temperature 98.1 F (36.7 C), temperature source Oral, resp. rate 18, height _0  (1.88 m), weight 155 lb 9.6 oz (70.6 kg), SpO2 95 %.  ECOG PERFORMANCE STATUS: 1-2  Physical Exam  Constitutional: Oriented to person, place, and time and thin appearing male and in no distress.  HENT:  Head: Normocephalic and atraumatic.  Mouth/Throat: Oropharynx is clear and moist. No oropharyngeal exudate.  No evidence of thrush Eyes: Conjunctivae are normal. Right eye exhibits no discharge. Left eye exhibits no discharge. No scleral icterus.  Neck: Normal range of motion. Neck supple.  Cardiovascular: Normal rate, regular rhythm, normal heart sounds and intact distal pulses.   Pulmonary/Chest: Effort  normal and breath sounds normal. No respiratory distress. No wheezes. No rales.  Abdominal: Soft. Bowel sounds are normal. Exhibits no distension and no mass. There is no tenderness.  Musculoskeletal: Normal range of motion. Exhibits no edema.  Lymphadenopathy:    No cervical adenopathy.  Neurological: Alert and oriented to person, place, and time. Exhibits muscle wasting.  The patient was examined in the wheelchair. Skin: Hypopigmentation on his face and upper torso and hands. Skin is warm and dry. Not diaphoretic. No erythema. No pallor.  Psychiatric: Mood, memory and judgment normal.  Vitals reviewed.    LABORATORY DATA: Lab Results  Component Value Date   WBC 3.5 (L) 07/06/2022   HGB 10.6 (L) 07/06/2022   HCT 32.0 (L) 07/06/2022   MCV 91.2 07/06/2022   PLT 168 07/06/2022      Chemistry      Component Value Date/Time   NA 140 07/06/2022 0915   K 4.3 07/06/2022 0915   CL 109 07/06/2022 0915   CO2 25 07/06/2022 0915   BUN 33 (H) 07/06/2022 0915   CREATININE 2.38 (H) 07/06/2022 0915      Component Value Date/Time   CALCIUM 8.7 (L) 07/06/2022 0915   ALKPHOS 74 07/06/2022 0915   AST 11 (L) 07/06/2022 0915   ALT 10  07/06/2022 0915   BILITOT 0.4 07/06/2022 0915       RADIOGRAPHIC STUDIES:  No results found.   ASSESSMENT/PLAN:  This is a very pleasant 88 year old African-American male diagnosed with a stage IVa (T4, N3, M1 B) non-small cell lung cancer, adenosquamous carcinoma in March 2022 and presented with large mass involving the lateral right upper lobe and right chest wall soft tissue in addition to right hilar, mediastinal and right subpectoral and supraclavicular lymphadenopathy with PD-L1 expression of 90%.   The patient is status post a course of concurrent chemoradiation with carboplatin for an AUC of 2 and paclitaxel 45 mg per metered square.  He is status post 6 cycles.    The patient had evidence of disease progression following concurrent chemoradiation.    The patient is currently undergoing single agent immunotherapy with Libtayo IV every 3 weeks due to his PD-L1 expression of 90%.  The patient is status post 22 cycles.  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  He was seen by Dr. Denna Haggard by dermatology.   Labs were reviewed. Recommend that he proceed with cycle #23 today as scheduled.    We will see him back for follow-up visit in 3 weeks for evaluation and repeat blood work before starting cycle number 24. He has baseline CKD. His creatinine is 2.38 today. Ok to proceed with treatment as scheduled with his creatinine.   We have previously had a discussion about his vitiligo and his immunotherapy and the decision to continue. Given his age and CKD, alternative treatments would likely be challenging for this patient. Therefore, he opted to continue on his current treatment. 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   The patient was advised to call immediately if she has any concerning symptoms in the interval. The patient voices understanding of current disease status and treatment options and is in agreement with the current care plan. All questions were answered. The patient knows to call the clinic with any problems, questions or concerns. We can certainly see the patient much sooner if necessary        No orders of the defined types were placed in this encounter.    The total time spent in the appointment was 20-29 minutes.   Anastacia Reinecke L Othal Kubitz, PA-C 07/06/22

## 2022-07-06 ENCOUNTER — Inpatient Hospital Stay: Payer: Medicare HMO

## 2022-07-06 ENCOUNTER — Inpatient Hospital Stay: Payer: Medicare HMO | Attending: Physician Assistant | Admitting: Physician Assistant

## 2022-07-06 ENCOUNTER — Other Ambulatory Visit: Payer: Self-pay

## 2022-07-06 VITALS — BP 148/70 | HR 59 | Temp 98.1°F | Resp 18 | Ht 74.0 in | Wt 155.6 lb

## 2022-07-06 VITALS — BP 141/68 | HR 68 | Resp 17

## 2022-07-06 DIAGNOSIS — C3411 Malignant neoplasm of upper lobe, right bronchus or lung: Secondary | ICD-10-CM | POA: Insufficient documentation

## 2022-07-06 DIAGNOSIS — Z79899 Other long term (current) drug therapy: Secondary | ICD-10-CM | POA: Insufficient documentation

## 2022-07-06 DIAGNOSIS — Z5112 Encounter for antineoplastic immunotherapy: Secondary | ICD-10-CM | POA: Insufficient documentation

## 2022-07-06 DIAGNOSIS — C7989 Secondary malignant neoplasm of other specified sites: Secondary | ICD-10-CM | POA: Insufficient documentation

## 2022-07-06 LAB — CBC WITH DIFFERENTIAL (CANCER CENTER ONLY)
Abs Immature Granulocytes: 0.01 10*3/uL (ref 0.00–0.07)
Basophils Absolute: 0 10*3/uL (ref 0.0–0.1)
Basophils Relative: 1 %
Eosinophils Absolute: 0.2 10*3/uL (ref 0.0–0.5)
Eosinophils Relative: 4 %
HCT: 32 % — ABNORMAL LOW (ref 39.0–52.0)
Hemoglobin: 10.6 g/dL — ABNORMAL LOW (ref 13.0–17.0)
Immature Granulocytes: 0 %
Lymphocytes Relative: 24 %
Lymphs Abs: 0.8 10*3/uL (ref 0.7–4.0)
MCH: 30.2 pg (ref 26.0–34.0)
MCHC: 33.1 g/dL (ref 30.0–36.0)
MCV: 91.2 fL (ref 80.0–100.0)
Monocytes Absolute: 0.3 10*3/uL (ref 0.1–1.0)
Monocytes Relative: 10 %
Neutro Abs: 2.1 10*3/uL (ref 1.7–7.7)
Neutrophils Relative %: 61 %
Platelet Count: 168 10*3/uL (ref 150–400)
RBC: 3.51 MIL/uL — ABNORMAL LOW (ref 4.22–5.81)
RDW: 16.5 % — ABNORMAL HIGH (ref 11.5–15.5)
WBC Count: 3.5 10*3/uL — ABNORMAL LOW (ref 4.0–10.5)
nRBC: 0 % (ref 0.0–0.2)

## 2022-07-06 LAB — CMP (CANCER CENTER ONLY)
ALT: 10 U/L (ref 0–44)
AST: 11 U/L — ABNORMAL LOW (ref 15–41)
Albumin: 3.2 g/dL — ABNORMAL LOW (ref 3.5–5.0)
Alkaline Phosphatase: 74 U/L (ref 38–126)
Anion gap: 6 (ref 5–15)
BUN: 33 mg/dL — ABNORMAL HIGH (ref 8–23)
CO2: 25 mmol/L (ref 22–32)
Calcium: 8.7 mg/dL — ABNORMAL LOW (ref 8.9–10.3)
Chloride: 109 mmol/L (ref 98–111)
Creatinine: 2.38 mg/dL — ABNORMAL HIGH (ref 0.61–1.24)
GFR, Estimated: 26 mL/min — ABNORMAL LOW (ref >60)
Glucose, Bld: 103 mg/dL — ABNORMAL HIGH (ref 70–99)
Potassium: 4.3 mmol/L (ref 3.5–5.1)
Sodium: 140 mmol/L (ref 135–145)
Total Bilirubin: 0.4 mg/dL (ref 0.3–1.2)
Total Protein: 7.4 g/dL (ref 6.5–8.1)

## 2022-07-06 LAB — TSH: TSH: 2.759 u[IU]/mL (ref 0.350–4.500)

## 2022-07-06 MED ORDER — SODIUM CHLORIDE 0.9 % IV SOLN
350.0000 mg | Freq: Once | INTRAVENOUS | Status: AC
  Administered 2022-07-06: 350 mg via INTRAVENOUS
  Filled 2022-07-06: qty 7

## 2022-07-06 MED ORDER — SODIUM CHLORIDE 0.9 % IV SOLN: Freq: Once | INTRAVENOUS | Status: AC

## 2022-07-06 NOTE — Patient Instructions (Signed)
[facility]  Discharge Instructions: Thank you for choosing [facility] to provide your oncology and hematology care.   If you have a lab appointment with the [facility], please go directly to the [facility] and check in at the registration area.   Wear comfortable clothing and clothing appropriate for easy access to any Portacath or PICC line.   We strive to give you quality time with your provider. You may need to reschedule your appointment if you arrive late (15 or more minutes).  Arriving late affects you and other patients whose appointments are after yours.  Also, if you miss three or more appointments without notifying the office, you may be dismissed from the clinic at the provider's discretion.      For prescription refill requests, have your pharmacy contact our office and allow 72 hours for refills to be completed.    Today you received the following chemotherapy and/or immunotherapy agents : Libtayo       To help prevent nausea and vomiting after your treatment, we encourage you to take your nausea medication as directed.  BELOW ARE SYMPTOMS THAT SHOULD BE REPORTED IMMEDIATELY: *FEVER GREATER THAN 100.4 F (38 C) OR HIGHER *CHILLS OR SWEATING *NAUSEA AND VOMITING THAT IS NOT CONTROLLED WITH YOUR NAUSEA MEDICATION *UNUSUAL SHORTNESS OF BREATH *UNUSUAL BRUISING OR BLEEDING *URINARY PROBLEMS (pain or burning when urinating, or frequent urination) *BOWEL PROBLEMS (unusual diarrhea, constipation, pain near the anus) TENDERNESS IN MOUTH AND THROAT WITH OR WITHOUT PRESENCE OF ULCERS (sore throat, sores in mouth, or a toothache) UNUSUAL RASH, SWELLING OR PAIN  UNUSUAL VAGINAL DISCHARGE OR ITCHING   Items with * indicate a potential emergency and should be followed up as soon as possible or go to the Emergency Department if any problems should occur.  Please show the CHEMOTHERAPY ALERT CARD or IMMUNOTHERAPY ALERT CARD at check-in to  the Emergency Department and triage nurse.  Should you have questions after your visit or need to cancel or reschedule your appointment, please contact [facility]  Dept: [phone]  and follow the prompts.  Office hours are 8:00 a.m. to 4:30 p.m. Monday - Friday. Please note that voicemails left after 4:00 p.m. may not be returned until the following business day.  We are closed weekends and major holidays. You have access to a nurse at all times for urgent questions. Please call the main number to the clinic Dept: [phone] and follow the prompts.   For any non-urgent questions, you may also contact your provider using MyChart. We now offer e-Visits for anyone 54 and older to request care online for non-urgent symptoms. For details visit [email or web address].   Also download the MyChart app! Go to the app store, search "MyChart", open the app, select [facility], and log in with your MyChart username and password.  Masks are optional in the cancer centers. If you would like for your care team to wear a mask while they are taking care of you, please let them know. You may have one support person who is at least 86 years old accompany you for your appointments.

## 2022-07-06 NOTE — Progress Notes (Signed)
Okay to treat with creat 2.38 per Cassie PA.

## 2022-07-07 ENCOUNTER — Other Ambulatory Visit: Payer: Self-pay

## 2022-07-07 LAB — T4: T4, Total: 7.7 ug/dL (ref 4.5–12.0)

## 2022-07-08 ENCOUNTER — Other Ambulatory Visit: Payer: Self-pay

## 2022-07-08 ENCOUNTER — Telehealth: Payer: Self-pay | Admitting: Internal Medicine

## 2022-07-08 NOTE — Telephone Encounter (Signed)
Scheduled per 10/09 los, called patient regarding upcoming appointments. Patient informed me to speak with his daughter regarding upcoming appointments, informed him I called three times and unfortunately the voicemail is full. I will attempt to contact daughter again, calender will be mailed but patient is notified of 10/30 appointment.

## 2022-07-09 ENCOUNTER — Other Ambulatory Visit: Payer: Self-pay

## 2022-07-13 DIAGNOSIS — C3411 Malignant neoplasm of upper lobe, right bronchus or lung: Secondary | ICD-10-CM | POA: Diagnosis not present

## 2022-07-13 DIAGNOSIS — D631 Anemia in chronic kidney disease: Secondary | ICD-10-CM | POA: Diagnosis not present

## 2022-07-13 DIAGNOSIS — N184 Chronic kidney disease, stage 4 (severe): Secondary | ICD-10-CM | POA: Diagnosis not present

## 2022-07-13 DIAGNOSIS — I129 Hypertensive chronic kidney disease with stage 1 through stage 4 chronic kidney disease, or unspecified chronic kidney disease: Secondary | ICD-10-CM | POA: Diagnosis not present

## 2022-07-27 ENCOUNTER — Inpatient Hospital Stay: Payer: Medicare HMO

## 2022-07-27 ENCOUNTER — Inpatient Hospital Stay (HOSPITAL_BASED_OUTPATIENT_CLINIC_OR_DEPARTMENT_OTHER): Payer: Medicare HMO | Admitting: Internal Medicine

## 2022-07-27 ENCOUNTER — Other Ambulatory Visit: Payer: Self-pay

## 2022-07-27 ENCOUNTER — Encounter: Payer: Self-pay | Admitting: Internal Medicine

## 2022-07-27 VITALS — BP 184/85 | HR 63 | Temp 97.9°F | Resp 18 | Wt 160.0 lb

## 2022-07-27 VITALS — BP 177/92 | HR 73 | Resp 17

## 2022-07-27 DIAGNOSIS — C3411 Malignant neoplasm of upper lobe, right bronchus or lung: Secondary | ICD-10-CM

## 2022-07-27 DIAGNOSIS — C3490 Malignant neoplasm of unspecified part of unspecified bronchus or lung: Secondary | ICD-10-CM

## 2022-07-27 DIAGNOSIS — Z5112 Encounter for antineoplastic immunotherapy: Secondary | ICD-10-CM | POA: Diagnosis not present

## 2022-07-27 DIAGNOSIS — Z79899 Other long term (current) drug therapy: Secondary | ICD-10-CM | POA: Diagnosis not present

## 2022-07-27 DIAGNOSIS — C7989 Secondary malignant neoplasm of other specified sites: Secondary | ICD-10-CM | POA: Diagnosis not present

## 2022-07-27 LAB — CBC WITH DIFFERENTIAL (CANCER CENTER ONLY)
Abs Immature Granulocytes: 0 10*3/uL (ref 0.00–0.07)
Basophils Absolute: 0 10*3/uL (ref 0.0–0.1)
Basophils Relative: 1 %
Eosinophils Absolute: 0.1 10*3/uL (ref 0.0–0.5)
Eosinophils Relative: 4 %
HCT: 32.8 % — ABNORMAL LOW (ref 39.0–52.0)
Hemoglobin: 10.9 g/dL — ABNORMAL LOW (ref 13.0–17.0)
Immature Granulocytes: 0 %
Lymphocytes Relative: 29 %
Lymphs Abs: 0.9 10*3/uL (ref 0.7–4.0)
MCH: 30.6 pg (ref 26.0–34.0)
MCHC: 33.2 g/dL (ref 30.0–36.0)
MCV: 92.1 fL (ref 80.0–100.0)
Monocytes Absolute: 0.3 10*3/uL (ref 0.1–1.0)
Monocytes Relative: 10 %
Neutro Abs: 1.7 10*3/uL (ref 1.7–7.7)
Neutrophils Relative %: 56 %
Platelet Count: 166 10*3/uL (ref 150–400)
RBC: 3.56 MIL/uL — ABNORMAL LOW (ref 4.22–5.81)
RDW: 15.9 % — ABNORMAL HIGH (ref 11.5–15.5)
WBC Count: 3.1 10*3/uL — ABNORMAL LOW (ref 4.0–10.5)
nRBC: 0 % (ref 0.0–0.2)

## 2022-07-27 LAB — CMP (CANCER CENTER ONLY)
ALT: 9 U/L (ref 0–44)
AST: 10 U/L — ABNORMAL LOW (ref 15–41)
Albumin: 3.3 g/dL — ABNORMAL LOW (ref 3.5–5.0)
Alkaline Phosphatase: 80 U/L (ref 38–126)
Anion gap: 7 (ref 5–15)
BUN: 39 mg/dL — ABNORMAL HIGH (ref 8–23)
CO2: 25 mmol/L (ref 22–32)
Calcium: 8.7 mg/dL — ABNORMAL LOW (ref 8.9–10.3)
Chloride: 108 mmol/L (ref 98–111)
Creatinine: 2.27 mg/dL — ABNORMAL HIGH (ref 0.61–1.24)
GFR, Estimated: 27 mL/min — ABNORMAL LOW (ref >60)
Glucose, Bld: 88 mg/dL (ref 70–99)
Potassium: 4.1 mmol/L (ref 3.5–5.1)
Sodium: 140 mmol/L (ref 135–145)
Total Bilirubin: 0.4 mg/dL (ref 0.3–1.2)
Total Protein: 7.4 g/dL (ref 6.5–8.1)

## 2022-07-27 LAB — TSH: TSH: 4.465 u[IU]/mL (ref 0.350–4.500)

## 2022-07-27 MED ORDER — SODIUM CHLORIDE 0.9 % IV SOLN: Freq: Once | INTRAVENOUS | Status: AC

## 2022-07-27 MED ORDER — SODIUM CHLORIDE 0.9 % IV SOLN
350.0000 mg | Freq: Once | INTRAVENOUS | Status: AC
  Administered 2022-07-27: 350 mg via INTRAVENOUS
  Filled 2022-07-27: qty 7

## 2022-07-27 NOTE — Patient Instructions (Addendum)
[facility]COL[facility]  Discharge Instructions: Thank you for choosing [facility] to provide your oncology and hematology care.   If you have a lab appointment with the [facility], please go directly to the [facility] and check in at the registration area.   Wear comfortable clothing and clothing appropriate for easy access to any Portacath or PICC line.   We strive to give you quality time with your provider. You may need to reschedule your appointment if you arrive late (15 or more minutes).  Arriving late affects you and other patients whose appointments are after yours.  Also, if you miss three or more appointments without notifying the office, you may be dismissed from the clinic at the provider's discretion.      For prescription refill requests, have your pharmacy contact our office and allow 72 hours for refills to be completed.    Today you received the following chemotherapy and/or immunotherapy agents: Libtayo      To help prevent nausea and vomiting after your treatment, we encourage you to take your nausea medication as directed.  BELOW ARE SYMPTOMS THAT SHOULD BE REPORTED IMMEDIATELY: *FEVER GREATER THAN 100.4 F (38 C) OR HIGHER *CHILLS OR SWEATING *NAUSEA AND VOMITING THAT IS NOT CONTROLLED WITH YOUR NAUSEA MEDICATION *UNUSUAL SHORTNESS OF BREATH *UNUSUAL BRUISING OR BLEEDING *URINARY PROBLEMS (pain or burning when urinating, or frequent urination) *BOWEL PROBLEMS (unusual diarrhea, constipation, pain near the anus) TENDERNESS IN MOUTH AND THROAT WITH OR WITHOUT PRESENCE OF ULCERS (sore throat, sores in mouth, or a toothache) UNUSUAL RASH, SWELLING OR PAIN  UNUSUAL VAGINAL DISCHARGE OR ITCHING   Items with * indicate a potential emergency and should be followed up as soon as possible or go to the Emergency Department if any problems should occur.  Please show the CHEMOTHERAPY ALERT CARD or IMMUNOTHERAPY ALERT CARD at check-in to  the Emergency Department and triage nurse.  Should you have questions after your visit or need to cancel or reschedule your appointment, please contact [facility]  Dept: [phone]  and follow the prompts.  Office hours are 8:00 a.m. to 4:30 p.m. Monday - Friday. Please note that voicemails left after 4:00 p.m. may not be returned until the following business day.  We are closed weekends and major holidays. You have access to a nurse at all times for urgent questions. Please call the main number to the clinic Dept: [phone] and follow the prompts.   For any non-urgent questions, you may also contact your provider using MyChart. We now offer e-Visits for anyone 78 and older to request care online for non-urgent symptoms. For details visit [email or web address].   Also download the MyChart app! Go to the app store, search "MyChart", open the app, select [facility], and log in with your MyChart username and password.  Masks are optional in the cancer centers. If you would like for your care team to wear a mask while they are taking care of you, please let them know. You may have one support person who is at least 86 years old accompany you for your appointments. Cemiplimab Injection What is this medication? CEMIPLIMAB (se MIP li mab) treats skin cancer and lung cancer. It works by helping your immune system slow or stop the spread of cancer cells. It is a monoclonal antibody. This medicine may be used for other purposes; ask your health care provider or pharmacist if you have questions. COMMON BRAND NAME(S): LIBTAYO What should  I tell my care team before I take this medication? They need to know if you have any of these conditions: Allogeneic stem cell transplant (uses someone else's stem cells) Autoimmune diseases, such as Crohn's disease, ulcerative colitis, or lupus Diabetes Nervous system problems, such as myasthenia gravis or Guillain-Barre  syndrome Organ transplant Recent or ongoing radiation Thyroid disease An unusual or allergic reaction to cemiplimab, other medications, foods, dyes, or preservatives Pregnant or trying to get pregnant Breast-feeding How should I use this medication? This medication is infused into a vein. It is given by your care team in a hospital or clinic setting. A special MedGuide will be given to you before each treatment. Be sure to read this information carefully each time. Talk to your care team about the use of this medication in children. Special care may be needed. Overdosage: If you think you have taken too much of this medicine contact a poison control center or emergency room at once. NOTE: This medicine is only for you. Do not share this medicine with others. What if I miss a dose? Keep appointments for follow-up doses. It is important not to miss your dose. Call your care team if you are unable to keep an appointment. What may interact with this medication? Interactions have not been studied. This list may not describe all possible interactions. Give your health care provider a list of all the medicines, herbs, non-prescription drugs, or dietary supplements you use. Also tell them if you smoke, drink alcohol, or use illegal drugs. Some items may interact with your medicine. What should I watch for while using this medication? This medication may make you feel generally unwell. This is not uncommon as chemotherapy can affect healthy cells as well as cancer cells. Report any side effects. Continue your course of treatment even though you feel ill until your care team tells you to stop. You may need blood work done while you are taking this medication. This medication may cause serious skin reactions. They can happen in the weeks to months after starting the medication. Contact your care team right away if you notice fevers or flu-like symptoms with a rash. The rash may be red or purple and then  turn into blisters or peeling of the skin. You may also notice a red rash with swelling of the face, lips, or lymph nodes in your neck or under your arms. Tell your care team right away if you have any changes in your vision. This medication may increase blood sugar. The risk may be higher in patients who already have diabetes. Ask your care team what you can do to lower your risk of diabetes while taking this medication. Talk to your care team if you wish to become pregnant or think you might be pregnant. This medication can cause serious birth defects if taken during pregnancy. A negative pregnancy test is required before starting this medication. A reliable form of contraception is recommended while taking this medication and for at least 4 months after stopping it. Do not breast-feed while taking this medication and for at least 4 months after stopping it. What side effects may I notice from receiving this medication? Side effects that you should report to your care team as soon as possible: Allergic reactions--skin rash, itching, hives, swelling of the face, lips, tongue, or throat Dry cough, shortness of breath or trouble breathing Eye pain, redness, irritation, or discharge with blurry or decreased vision Heart muscle inflammation--unusual weakness or fatigue, shortness of breath, chest pain,  fast or irregular heartbeat, dizziness, swelling of the ankles, feet, or hands Hormone gland problems--headache, sensitivity to light, unusual weakness or fatigue, dizziness, fast or irregular heartbeat, increased sensitivity to cold or heat, excessive sweating, constipation, hair loss, increased thirst or amount of urine, tremors or shaking, irritability Infusion reactions--chest pain, shortness of breath or trouble breathing, feeling faint or lightheaded Kidney injury (glomerulonephritis)--decrease in the amount of urine, red or dark brown urine, foamy or bubbly urine, swelling of the ankles, hands, or  feet Liver injury--right upper belly pain, loss of appetite, nausea, light-colored stool, dark yellow or brown urine, yellowing skin or eyes, unusual weakness or fatigue Pain, tingling, or numbness in the hands or feet, muscle weakness, change in vision, confusion or trouble speaking, loss of balance or coordination, trouble walking, seizures Rash, fever, and swollen lymph nodes Redness, blistering, peeling, or loosening of the skin, including inside the mouth Sudden or severe stomach pain, bloody diarrhea, fever, nausea, vomiting Side effects that usually do not require medical attention (report these to your care team if they continue or are bothersome): Bone, joint, or muscle pain Diarrhea Fatigue Loss of appetite Nausea Skin rash This list may not describe all possible side effects. Call your doctor for medical advice about side effects. You may report side effects to FDA at 1-800-FDA-1088. Where should I keep my medication? This medication is given in a hospital or clinic and will not be stored at home. NOTE: This sheet is a summary. It may not cover all possible information. If you have questions about this medicine, talk to your doctor, pharmacist, or health care provider.  2023 Elsevier/Gold Standard (2021-08-20 00:00:00)

## 2022-07-27 NOTE — Progress Notes (Signed)
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  Biomarker Findings Tumor Mutational Burden - 10 Muts/Mb Microsatellite status - MS-Stable Genomic Findings For a complete list of the genes assayed, please refer to the Appendix. KRAS G12C NFKBIA amplification NKX2-1 amplification RBM10 [id number]*80 7 Disease relevant genes with no reportable alterations: ALK, BRAF, EGFR, ERBB2, MET, RET, ROS1   PDL1: 90%   PRIOR THERAPY:  Weekly concurrent chemoradiation with carboplatin for an AUC of 2 and paclitaxel 45 mg/m.  First dose on 12/30/2020.  Status post 6 cycles.   CURRENT THERAPY: First-line treatment with immunotherapy with Libtayo (Cempilimab) 350 Mg IV every 3 weeks.  Status post 23 cycles.  INTERVAL HISTORY: Brian BARNHARD88y.o. male returns to the clinic today for follow-up visit accompanied by his son-in-law.  The patient is feeling fine today with no concerning complaints except for the persistent vitiligo.  He was seen by dermatology in the past and was recommended to continue on observation.  He was offered some topical creams but it was so expensive and that it may interfere with immunotherapy.  He denied having any current chest pain, shortness of breath, cough or hemoptysis.  He denied having any fever or chills.  He has no nausea, vomiting, diarrhea or constipation.  He has no headache or visual changes.  He has no recent weight loss or night sweats.  He has been tolerating his treatment with Libtayo (Cempilimab) fairly well.  He is here today for evaluation before  starting cycle #24.  MEDICAL HISTORY: Past Medical History:  Diagnosis Date   Arthritis    Hypertension    Hyperthyroidism    lung ca 11/2020    ALLERGIES:  has No Known Allergies.  MEDICATIONS:  Current Outpatient Medications  Medication Sig Dispense Refill   amLODipine (NORVASC) 10 MG tablet Take 10 mg by mouth daily.     gabapentin (NEURONTIN) 250 MG/5ML solution Take 3 mLs (150 mg total) by mouth at bedtime. 90 mL 2   hydrochlorothiazide (MICROZIDE) 12.5 MG capsule Take 12.5 mg by mouth daily.     labetalol (NORMODYNE) 200 MG tablet Take 200 mg by mouth 2 (two) times daily.     levothyroxine (SYNTHROID) 150 MCG tablet Take 150 mcg by mouth daily.     levothyroxine (SYNTHROID) 175 MCG tablet Take 1 tablet (175 mcg total) by mouth daily before breakfast. 30 tablet 2   loperamide (IMODIUM) 2 MG capsule Take by mouth as needed for diarrhea or loose stools.     naproxen (NAPROSYN) 500 MG tablet Take 500 mg by mouth 2 (two) times daily as needed for moderate pain.     oxyCODONE-acetaminophen (PERCOCET/ROXICET) 5-325 MG tablet Take 1 tablet by mouth every 8 (eight) hours as needed for severe pain. 30 tablet 0   prochlorperazine (COMPAZINE) 10 MG tablet Take 1 tablet (10 mg total) by mouth every 6 (six) hours as needed for nausea or vomiting. 30 tablet 2   tadalafil (CIALIS) 5 MG tablet Take 5 mg by mouth daily.     tamsulosin (FLOMAX) 0.4 MG CAPS capsule Take 0.4 mg by mouth daily.  No current facility-administered medications for this visit.    SURGICAL HISTORY:  Past Surgical History:  Procedure Laterality Date   COLONOSCOPY N/A 09/02/2016   Procedure: COLONOSCOPY;  Surgeon: Rogene Houston, MD;  Location: [facility];  Service: Endoscopy;  Laterality: N/A;  830   NO PAST SURGERIES     POLYPECTOMY  09/02/2016   Procedure: POLYPECTOMY;  Surgeon: Rogene Houston, MD;  Location: [facility];  Service: Endoscopy;;    REVIEW OF SYSTEMS:  A comprehensive review of systems was  negative except for: Constitutional: positive for fatigue Integument/breast: positive for skin color change   PHYSICAL EXAMINATION: General appearance: alert, cooperative, and no distress Head: Normocephalic, without obvious abnormality, atraumatic Neck: no adenopathy, no JVD, supple, symmetrical, trachea midline, and thyroid not enlarged, symmetric, no tenderness/mass/nodules Lymph nodes: Cervical, supraclavicular, and axillary nodes normal. Resp: clear to auscultation bilaterally Back: symmetric, no curvature. ROM normal. No CVA tenderness. Cardio: regular rate and rhythm, S1, S2 normal, no murmur, click, rub or gallop GI: soft, non-tender; bowel sounds normal; no masses,  no organomegaly Extremities: extremities normal, atraumatic, no cyanosis or edema        ECOG PERFORMANCE STATUS: 1 - Symptomatic but completely ambulatory  Blood pressure (!) 184/85, pulse 63, temperature 97.9 F (36.6 C), temperature source Oral, resp. rate 18, weight 160 lb (72.6 kg), SpO2 95 %.  LABORATORY DATA: Lab Results  Component Value Date   WBC 3.1 (L) 07/27/2022   HGB 10.9 (L) 07/27/2022   HCT 32.8 (L) 07/27/2022   MCV 92.1 07/27/2022   PLT 166 07/27/2022      Chemistry      Component Value Date/Time   NA 140 07/27/2022 0825   K 4.1 07/27/2022 0825   CL 108 07/27/2022 0825   CO2 25 07/27/2022 0825   BUN 39 (H) 07/27/2022 0825   CREATININE 2.27 (H) 07/27/2022 0825      Component Value Date/Time   CALCIUM 8.7 (L) 07/27/2022 0825   ALKPHOS 80 07/27/2022 0825   AST 10 (L) 07/27/2022 0825   ALT 9 07/27/2022 0825   BILITOT 0.4 07/27/2022 0825       RADIOGRAPHIC STUDIES: No results found.  ASSESSMENT AND PLAN: This is a very pleasant 88 years old African-American male recently diagnosed with stage IVA (T4, N3, M1b) non-small cell lung cancer, adenosquamous carcinoma in March 2022 and presented with large mass involving the lateral right upper lobe and right chest wall soft tissue in  addition to right hilar, mediastinal and right subpectoral and supraclavicular lymphadenopathy with PD-L1 expression of 90%.  The patient underwent a course of concurrent chemoradiation with weekly carboplatin for AUC of 2 and paclitaxel 45 Mg/M2 status post 6 cycles. He tolerated the previous course of his concurrent chemoradiation fairly well except for fatigue. Unfortunately his CT scan of the chest after the induction phase showed interval enlargement of a large mass centered at the periphery of the right upper lobe and extending into the adjacent chest wall measuring 8.4 x 6.7 cm with bony destruction of the right third and fourth ribs substantially increased compared to the prior examination and the findings are consistent with worsened malignancy. The patient has PD-L1 expression of 90% and I felt he will be a good candidate for treatment with first-line single agent immunotherapy. I recommended for him treatment with Libtayo (Cempilimab) 350 Mg IV every 3 weeks.  He is status post 23 cycles of treatment. The patient has been tolerating this treatment well except for the  vitiligo. I recommended for him to proceed with cycle #24 today as planned. I will see him back for follow-up visit in 3 weeks for evaluation with repeat CT scan of the chest, abdomen and pelvis for restaging of his disease. The patient was advised to call immediately if he has any other concerning symptoms in the interval. For the hypothyroidism, the patient will continue his current treatment with levothyroxine.  The patient voices understanding of current disease status and treatment options and is in agreement with the current care plan.  All questions were answered. The patient knows to call the clinic with any problems, questions or concerns. We can certainly see the patient much sooner if necessary.   Disclaimer: This note was dictated with voice recognition software. Similar sounding words can inadvertently be  transcribed and may not be corrected upon review.

## 2022-07-27 NOTE — Progress Notes (Signed)
Per Dr. Julien Nordmann, okay to treat with BP 185/89 and SCr 2.27mg /dL.

## 2022-07-28 LAB — T4: T4, Total: 7.5 ug/dL (ref 4.5–12.0)

## 2022-08-11 NOTE — Progress Notes (Signed)
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   Biomarker Findings Tumor Mutational Burden - 10 Muts/Mb Microsatellite status - MS-Stable Genomic Findings For a complete list of the genes assayed, please refer to the Appendix. KRAS G12C NFKBIA amplification NKX2-1 amplification RBM10 [id number] 7 Disease relevant genes with no reportable alterations: ALK, BRAF, EGFR, ERBB2, MET, RET, ROS1   PDL1: 90%  PRIOR THERAPY: Weekly concurrent chemoradiation with carboplatin for an AUC of 2 and paclitaxel 45 mg/m.  First dose on 12/30/2020.  Status post 6 cycles   CURRENT THERAPY: First-line treatment with immunotherapy with Libtayo (Cempilimab) 350 Mg IV every 3 weeks. First dose on 03/19/21. Status post 24 cycles.   INTERVAL HISTORY: HYANKY VANDERBURG89y.o. male returns to the clinic today for a follow-up visit accompanied by his son-in-law.  The patient is feeling fairly well today without any concern complaints. He is tolerating his treatment well except he developed immunotherapy mediated vitiligo. He feels well and denies changes in his health since last being seen. He denies any fever, chills, or night sweats.  He was previously followed by member the nutritionist team for his weight loss; however, his weight has been relatively stable for the last few appointments. He reports stable fatigue. He reports dyspnea on exertion which is stable.  He denies significant cough. Denies any chest pain or hemoptysis. Denies any headache or visual changes. He denies nausea, vomiting, diarrhea, constipation, abdominal pain, or blood in the stool.  The  patient recently had a restaging CT scan performed. He is here today for evaluation and to review his scan of treatment with cycle #25.       MEDICAL HISTORY: Past Medical History:  Diagnosis Date   Arthritis    Hypertension    Hyperthyroidism    lung ca 11/2020    ALLERGIES:  has No Known Allergies.  MEDICATIONS:  Current Outpatient Medications  Medication Sig Dispense Refill   amLODipine (NORVASC) 10 MG tablet Take 10 mg by mouth daily.     gabapentin (NEURONTIN) 250 MG/5ML solution Take 3 mLs (150 mg total) by mouth at bedtime. 90 mL 2   hydrochlorothiazide (MICROZIDE) 12.5 MG capsule Take 12.5 mg by mouth daily.     labetalol (NORMODYNE) 200 MG tablet Take 200 mg by mouth 2 (two) times daily.     levothyroxine (SYNTHROID) 150 MCG tablet Take 150 mcg by mouth daily.     levothyroxine (SYNTHROID) 175 MCG tablet Take 1 tablet (175 mcg total) by mouth daily before breakfast. 30 tablet 2   loperamide (IMODIUM) 2 MG capsule Take by mouth as needed for diarrhea or loose stools.     naproxen (NAPROSYN) 500 MG tablet Take 500 mg by mouth 2 (two) times daily as needed for moderate pain.     oxyCODONE-acetaminophen (PERCOCET/ROXICET) 5-325 MG tablet Take 1 tablet by mouth every 8 (eight) hours as needed for severe pain. 30 tablet 0   prochlorperazine (COMPAZINE) 10 MG tablet Take 1 tablet (10 mg total) by mouth every 6 (six) hours as needed for nausea or vomiting. 30 tablet 2   tadalafil (CIALIS) 5 MG tablet Take 5 mg by mouth daily.  tamsulosin (FLOMAX) 0.4 MG CAPS capsule Take 0.4 mg by mouth daily.     No current facility-administered medications for this visit.    SURGICAL HISTORY:  Past Surgical History:  Procedure Laterality Date   COLONOSCOPY N/A 09/02/2016   Procedure: COLONOSCOPY;  Surgeon: Rogene Houston, MD;  Location: [facility];  Service: Endoscopy;  Laterality: N/A;  830   NO PAST SURGERIES     POLYPECTOMY  09/02/2016   Procedure: POLYPECTOMY;  Surgeon: Rogene Houston, MD;  Location: [facility];  Service: Endoscopy;;    REVIEW OF SYSTEMS:   Constitutional: Positive for stable fatigue. Negative for appetite change, chills, fever and unexpected weight change.  HENT: Negative for mouth sores, nosebleeds, sore throat and trouble swallowing.   Eyes: Negative for eye problems and icterus.  Respiratory: Positive for stable shortness of breath with exertion. Negative for hemoptysis, cough and wheezing.  Cardiovascular: Negative for chest pain and leg swelling.  Gastrointestinal: Negative for abdominal pain, constipation, diarrhea, nausea and vomiting.  Genitourinary: Negative for bladder incontinence, difficulty urinating, dysuria, frequency and hematuria.   Musculoskeletal: Negative for back pain, gait problem, neck pain and neck stiffness.  Skin: Positive for hypopigmentation on face/vitiligo .  Neurological: Negative for dizziness, extremity weakness, gait problem, headaches, light-headedness and seizures.  Hematological: Negative for adenopathy. Does not bruise/bleed easily.  Psychiatric/Behavioral: Negative for confusion, depression and sleep disturbance. The patient is not nervous/anxious.     PHYSICAL EXAMINATION:  Blood pressure (!) 156/78, pulse 65, temperature 97.8 F (36.6 C), temperature source Oral, resp. rate 16, weight 158 lb 6.4 oz (71.8 kg), SpO2 95 %.  ECOG PERFORMANCE STATUS: 1-2  Physical Exam  Constitutional: Oriented to person, place, and time and thin appearing male and in no distress.  HENT:  Head: Normocephalic and atraumatic.  Mouth/Throat: Oropharynx is clear and moist. No oropharyngeal exudate.  No evidence of thrush Eyes: Conjunctivae are normal. Right eye exhibits no discharge. Left eye exhibits no discharge. No scleral icterus.  Neck: Normal range of motion. Neck supple.  Cardiovascular: Normal rate, regular rhythm, normal heart sounds and intact distal pulses.   Pulmonary/Chest: Effort normal and breath sounds  normal. No respiratory distress. No wheezes. No rales.  Abdominal: Soft. Bowel sounds are normal. Exhibits no distension and no mass. There is no tenderness.  Musculoskeletal: Normal range of motion. Exhibits no edema.  Lymphadenopathy:    No cervical adenopathy.  Neurological: Alert and oriented to person, place, and time. Exhibits muscle wasting.  The patient was examined in the wheelchair. Skin: Hypopigmentation on his face and upper torso and hands. Skin is warm and dry. Not diaphoretic. No erythema. No pallor.  Psychiatric: Mood, memory and judgment normal.  Vitals reviewed.  LABORATORY DATA: Lab Results  Component Value Date   WBC 3.1 (L) 08/17/2022   HGB 11.3 (L) 08/17/2022   HCT 34.3 (L) 08/17/2022   MCV 91.5 08/17/2022   PLT 181 08/17/2022      Chemistry      Component Value Date/Time   NA 137 08/17/2022 1357   K 4.3 08/17/2022 1357   CL 105 08/17/2022 1357   CO2 27 08/17/2022 1357   BUN 37 (H) 08/17/2022 1357   CREATININE 2.58 (H) 08/17/2022 1357      Component Value Date/Time   CALCIUM 9.0 08/17/2022 1357   ALKPHOS 77 08/17/2022 1357   AST 11 (L) 08/17/2022 1357   ALT 8 08/17/2022 1357   BILITOT 0.4 08/17/2022 1357  RADIOGRAPHIC STUDIES:  CT Chest Wo Contrast  Result Date: 08/16/2022 CLINICAL DATA:  Non-small cell lung cancer staging. Surveillance exam. * Tracking Code: BO * EXAM: CT CHEST, ABDOMEN AND PELVIS WITHOUT CONTRAST TECHNIQUE: Multidetector CT imaging of the chest, abdomen and pelvis was performed following the standard protocol without IV contrast. RADIATION DOSE REDUCTION: This exam was performed according to the departmental dose-optimization program which includes automated exposure control, adjustment of the mA and/or kV according to patient size and/or use of iterative reconstruction technique. COMPARISON:  05/21/2022 FINDINGS: CT CHEST FINDINGS Cardiovascular: Coronary artery calcification and aortic atherosclerotic calcification.  Mediastinum/Nodes: Enlarged thyroid gland extends into the RIGHT upper paratracheal mediastinum. Borderline enlarged mediastinal lymph nodes again noted. For example 10 mm nodule AP window (image 25/503) Lungs/Pleura: Post radiation change in the lateral aspect of the RIGHT upper lobe not changed from comparison exam. Perihilar bronchiectasis in the RIGHT upper lobe also unchanged. No new or suspicious pulmonary nodules in LEFT or RIGHT lung. Musculoskeletal: No aggressive osseous lesion CT ABDOMEN PELVIS FINDINGS Hepatobiliary: No focal hepatic lesion on noncontrast exam Pancreas: Normal pancreas Spleen: Low-density lesion in the spleen unchanged from multiple comparison exams. Adrenals/Urinary Tract: Adrenal glands normal. Bilateral renal cysts unchanged. Ureters normal. Bladder is mildly thick-walled. Prostate gland enlarged. Stomach/Bowel: Stomach, small bowel, appendix, and cecum are normal. Multiple diverticula of the descending colon and sigmoid colon without acute inflammation. Vascular/Lymphatic: Abdominal aorta is normal caliber with atherosclerotic calcification. There is no retroperitoneal or periportal lymphadenopathy. No pelvic lymphadenopathy. Reproductive: Prostate enlarged Other: No free-fluid.  No peritoneal nodularity Musculoskeletal: No evidence skeletal metastasis. Degenerate spurring of the spine. IMPRESSION: Chest Impression: 1. Stable post radiation change in the RIGHT upper lobe. 2. No new or progressive lung carcinoma identified within lungs. 3. Extensive centrilobular emphysema. 4. Prominent mediastinal lymph nodes are unchanged. Abdomen / Pelvis Impression: 1. No evidence of metastatic disease in the abdomen pelvis. 2. No skeletal metastasis. Electronically Signed   By: Suzy Bouchard M.D.   On: 08/16/2022 17:26   CT Abdomen Pelvis Wo Contrast  Result Date: 08/16/2022 CLINICAL DATA:  Non-small cell lung cancer staging. Surveillance exam. * Tracking Code: BO * EXAM: CT CHEST,  ABDOMEN AND PELVIS WITHOUT CONTRAST TECHNIQUE: Multidetector CT imaging of the chest, abdomen and pelvis was performed following the standard protocol without IV contrast. RADIATION DOSE REDUCTION: This exam was performed according to the departmental dose-optimization program which includes automated exposure control, adjustment of the mA and/or kV according to patient size and/or use of iterative reconstruction technique. COMPARISON:  05/21/2022 FINDINGS: CT CHEST FINDINGS Cardiovascular: Coronary artery calcification and aortic atherosclerotic calcification. Mediastinum/Nodes: Enlarged thyroid gland extends into the RIGHT upper paratracheal mediastinum. Borderline enlarged mediastinal lymph nodes again noted. For example 10 mm nodule AP window (image 25/503) Lungs/Pleura: Post radiation change in the lateral aspect of the RIGHT upper lobe not changed from comparison exam. Perihilar bronchiectasis in the RIGHT upper lobe also unchanged. No new or suspicious pulmonary nodules in LEFT or RIGHT lung. Musculoskeletal: No aggressive osseous lesion CT ABDOMEN PELVIS FINDINGS Hepatobiliary: No focal hepatic lesion on noncontrast exam Pancreas: Normal pancreas Spleen: Low-density lesion in the spleen unchanged from multiple comparison exams. Adrenals/Urinary Tract: Adrenal glands normal. Bilateral renal cysts unchanged. Ureters normal. Bladder is mildly thick-walled. Prostate gland enlarged. Stomach/Bowel: Stomach, small bowel, appendix, and cecum are normal. Multiple diverticula of the descending colon and sigmoid colon without acute inflammation. Vascular/Lymphatic: Abdominal aorta is normal caliber with atherosclerotic calcification. There is no retroperitoneal or periportal lymphadenopathy. No pelvic lymphadenopathy.  Reproductive: Prostate enlarged Other: No free-fluid.  No peritoneal nodularity Musculoskeletal: No evidence skeletal metastasis. Degenerate spurring of the spine. IMPRESSION: Chest Impression: 1. Stable  post radiation change in the RIGHT upper lobe. 2. No new or progressive lung carcinoma identified within lungs. 3. Extensive centrilobular emphysema. 4. Prominent mediastinal lymph nodes are unchanged. Abdomen / Pelvis Impression: 1. No evidence of metastatic disease in the abdomen pelvis. 2. No skeletal metastasis. Electronically Signed   By: Suzy Bouchard M.D.   On: 08/16/2022 17:26     ASSESSMENT/PLAN:  This is a very pleasant 88 year old African-American male diagnosed with a stage IVa (T4, N3, M1 B) non-small cell lung cancer, adenosquamous carcinoma in March 2022 and presented with large mass involving the lateral right upper lobe and right chest wall soft tissue in addition to right hilar, mediastinal and right subpectoral and supraclavicular lymphadenopathy with PD-L1 expression of 90%.   The patient is status post a course of concurrent chemoradiation with carboplatin for an AUC of 2 and paclitaxel 45 mg per metered square.  He is status post 6 cycles.   The patient had evidence of disease progression following concurrent chemoradiation.   The patient is currently undergoing single agent immunotherapy with Libtayo IV every 3 weeks due to his PD-L1 expression of 90%.  The patient is status post 24 cycles.  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  He was seen by Dr. Denna Haggard by dermatology.   The patient recently had a restaging CT scan performed.  Dr. Julien Nordmann personally independently reviewed the scan and discussed the results with the patient today.  The scan did not show any evidence of disease progression.  Recommend that he continue with cycle #25 today scheduled.  Labs were reviewed.  The patient has baseline CKD.  His creatinine is 2.58 today.  Recommend that he proceed with treatment with his creatinine today.  ThankWe have previously had a discussion about his vitiligo and his immunotherapy and the decision to  continue. Given his age and CKD, alternative treatments would likely be challenging for this patient. Therefore, he opted to continue on his current treatment. Of note, he does have KRAS G12C mutation    The patient was advised to call immediately if she has any concerning symptoms in the interval. The patient voices understanding of current disease status and treatment options and is in agreement with the current care plan. All questions were answered. The patient knows to call the clinic with any problems, questions or concerns. We can certainly see the patient much sooner if necessary        Orders Placed This Encounter  Procedures   CBC with Differential ([place] Only)    Standing Status:   Future    Standing Expiration Date:   09/08/2023   CMP ([place] only)    Standing Status:   Future    Standing Expiration Date:   09/08/2023   T4    Standing Status:   Future    Standing Expiration Date:   09/08/2023   TSH    Standing Status:   Future    Standing Expiration Date:   09/08/2023   CBC with Differential ([place] Only)    Standing Status:   Future    Standing Expiration Date:   09/29/2023   CMP ([place] only)    Standing Status:   Future    Standing Expiration Date:   09/29/2023   T4  Standing Status:   Future    Standing Expiration Date:   09/29/2023   TSH    Standing Status:   Future    Standing Expiration Date:   09/29/2023   CBC with Differential ([facility] Only)    Standing Status:   Future    Standing Expiration Date:   10/20/2023   CMP ([place] only)    Standing Status:   Future    Standing Expiration Date:   10/20/2023   T4    Standing Status:   Future    Standing Expiration Date:   10/20/2023   TSH    Standing Status:   Future    Standing Expiration Date:   10/20/2023   CBC with Differential ([facility] Only)    Standing Status:   Future    Standing Expiration Date:   11/10/2023   CMP ([place] only)    Standing Status:    Future    Standing Expiration Date:   11/10/2023   T4    Standing Status:   Future    Standing Expiration Date:   11/10/2023   TSH    Standing Status:   Future    Standing Expiration Date:   11/10/2023      Tobe Sos Shamyia Grandpre, PA-C 08/17/22  ADDENDUM: Hematology/Oncology Attending: I had a face-to-face encounter with the patient today.  I reviewed his record, lab, scan and recommended his care plan.  This is a very pleasant 88 years old African-American male diagnosed with a stage IV non-small cell lung cancer, adenocarcinoma in March 2022 and he has PD-L1 expression of 90% and no actionable mutations.  He still started a course of concurrent chemoradiation with weekly carboplatin and paclitaxel for the locally advanced disease in the lung.  The patient then started systemic treatment with immunotherapy with Libtayo (Cempilimab) 350 Mg IV every 3 weeks status post 24 cycles.  He has been tolerating this treatment well except for the immunotherapy mediated vitiligo which is significant on the face and arms as well as the chest. The patient had repeat CT scan of the chest, abdomen and pelvis performed recently.  I personally and independently reviewed the scan and discussed the result with the patient and his son-in-law. His scan showed no concerning findings for disease progression. I recommended for the patient to continue his current treatment with Imfinzi and he will proceed with cycle #25 today. We will see him back for follow-up visit in 3 weeks for evaluation before the next cycle of his treatment. The patient was advised to call immediately if he has any other concerning symptoms in the interval. The total time spent in the appointment was 30 minutes. Disclaimer: This note was dictated with voice recognition software. Similar sounding words can inadvertently be transcribed and may be missed upon review. Eilleen Kempf, MD

## 2022-08-15 ENCOUNTER — Ambulatory Visit (HOSPITAL_COMMUNITY)
Admission: RE | Admit: 2022-08-15 | Discharge: 2022-08-15 | Disposition: A | Discharge status: Home / Self Care | Payer: Medicare HMO | Source: Ambulatory Visit | Attending: Internal Medicine | Admitting: Internal Medicine

## 2022-08-15 DIAGNOSIS — C3490 Malignant neoplasm of unspecified part of unspecified bronchus or lung: Secondary | ICD-10-CM

## 2022-08-15 DIAGNOSIS — D7389 Other diseases of spleen: Secondary | ICD-10-CM | POA: Diagnosis not present

## 2022-08-15 DIAGNOSIS — N281 Cyst of kidney, acquired: Secondary | ICD-10-CM | POA: Diagnosis not present

## 2022-08-15 DIAGNOSIS — J479 Bronchiectasis, uncomplicated: Secondary | ICD-10-CM | POA: Diagnosis not present

## 2022-08-15 DIAGNOSIS — J432 Centrilobular emphysema: Secondary | ICD-10-CM | POA: Diagnosis not present

## 2022-08-17 ENCOUNTER — Inpatient Hospital Stay: Payer: Medicare HMO

## 2022-08-17 ENCOUNTER — Inpatient Hospital Stay: Payer: Medicare HMO | Attending: Physician Assistant

## 2022-08-17 ENCOUNTER — Other Ambulatory Visit: Payer: Self-pay

## 2022-08-17 ENCOUNTER — Inpatient Hospital Stay (HOSPITAL_BASED_OUTPATIENT_CLINIC_OR_DEPARTMENT_OTHER): Payer: Medicare HMO | Admitting: Physician Assistant

## 2022-08-17 VITALS — BP 156/78 | HR 65 | Temp 97.8°F | Resp 16 | Wt 158.4 lb

## 2022-08-17 VITALS — BP 139/75 | HR 76 | Temp 97.9°F | Resp 18

## 2022-08-17 DIAGNOSIS — C3411 Malignant neoplasm of upper lobe, right bronchus or lung: Secondary | ICD-10-CM

## 2022-08-17 DIAGNOSIS — C7989 Secondary malignant neoplasm of other specified sites: Secondary | ICD-10-CM | POA: Insufficient documentation

## 2022-08-17 DIAGNOSIS — Z5112 Encounter for antineoplastic immunotherapy: Secondary | ICD-10-CM | POA: Insufficient documentation

## 2022-08-17 DIAGNOSIS — C778 Secondary and unspecified malignant neoplasm of lymph nodes of multiple regions: Secondary | ICD-10-CM | POA: Insufficient documentation

## 2022-08-17 DIAGNOSIS — Z79899 Other long term (current) drug therapy: Secondary | ICD-10-CM | POA: Diagnosis not present

## 2022-08-17 LAB — CMP (CANCER CENTER ONLY)
ALT: 8 U/L (ref 0–44)
AST: 11 U/L — ABNORMAL LOW (ref 15–41)
Albumin: 3.6 g/dL (ref 3.5–5.0)
Alkaline Phosphatase: 77 U/L (ref 38–126)
Anion gap: 5 (ref 5–15)
BUN: 37 mg/dL — ABNORMAL HIGH (ref 8–23)
CO2: 27 mmol/L (ref 22–32)
Calcium: 9 mg/dL (ref 8.9–10.3)
Chloride: 105 mmol/L (ref 98–111)
Creatinine: 2.58 mg/dL — ABNORMAL HIGH (ref 0.61–1.24)
GFR, Estimated: 24 mL/min — ABNORMAL LOW (ref >60)
Glucose, Bld: 94 mg/dL (ref 70–99)
Potassium: 4.3 mmol/L (ref 3.5–5.1)
Sodium: 137 mmol/L (ref 135–145)
Total Bilirubin: 0.4 mg/dL (ref 0.3–1.2)
Total Protein: 7.9 g/dL (ref 6.5–8.1)

## 2022-08-17 LAB — CBC WITH DIFFERENTIAL (CANCER CENTER ONLY)
Abs Immature Granulocytes: 0.01 10*3/uL (ref 0.00–0.07)
Basophils Absolute: 0 10*3/uL (ref 0.0–0.1)
Basophils Relative: 0 %
Eosinophils Absolute: 0.1 10*3/uL (ref 0.0–0.5)
Eosinophils Relative: 3 %
HCT: 34.3 % — ABNORMAL LOW (ref 39.0–52.0)
Hemoglobin: 11.3 g/dL — ABNORMAL LOW (ref 13.0–17.0)
Immature Granulocytes: 0 %
Lymphocytes Relative: 29 %
Lymphs Abs: 0.9 10*3/uL (ref 0.7–4.0)
MCH: 30.1 pg (ref 26.0–34.0)
MCHC: 32.9 g/dL (ref 30.0–36.0)
MCV: 91.5 fL (ref 80.0–100.0)
Monocytes Absolute: 0.3 10*3/uL (ref 0.1–1.0)
Monocytes Relative: 8 %
Neutro Abs: 1.9 10*3/uL (ref 1.7–7.7)
Neutrophils Relative %: 60 %
Platelet Count: 181 10*3/uL (ref 150–400)
RBC: 3.75 MIL/uL — ABNORMAL LOW (ref 4.22–5.81)
RDW: 15.4 % (ref 11.5–15.5)
WBC Count: 3.1 10*3/uL — ABNORMAL LOW (ref 4.0–10.5)
nRBC: 0 % (ref 0.0–0.2)

## 2022-08-17 LAB — TSH: TSH: 5.124 u[IU]/mL — ABNORMAL HIGH (ref 0.350–4.500)

## 2022-08-17 MED ORDER — SODIUM CHLORIDE 0.9 % IV SOLN
350.0000 mg | Freq: Once | INTRAVENOUS | Status: AC
  Administered 2022-08-17: 350 mg via INTRAVENOUS
  Filled 2022-08-17: qty 7

## 2022-08-17 MED ORDER — SODIUM CHLORIDE 0.9 % IV SOLN: Freq: Once | INTRAVENOUS | Status: AC

## 2022-08-17 NOTE — Progress Notes (Signed)
Per Cassie Heilingoetter, PA, ok for treatment today with elevated serum creatine 2.58 mg/dL

## 2022-08-17 NOTE — Patient Instructions (Signed)
[facility]  Discharge Instructions: Thank you for choosing [facility] to provide your oncology and hematology care.   If you have a lab appointment with the [facility], please go directly to the [facility] and check in at the registration area.   Wear comfortable clothing and clothing appropriate for easy access to any Portacath or PICC line.   We strive to give you quality time with your provider. You may need to reschedule your appointment if you arrive late (15 or more minutes).  Arriving late affects you and other patients whose appointments are after yours.  Also, if you miss three or more appointments without notifying the office, you may be dismissed from the clinic at the provider's discretion.      For prescription refill requests, have your pharmacy contact our office and allow 72 hours for refills to be completed.    Today you received the following chemotherapy and/or immunotherapy agent: Libtayo   To help prevent nausea and vomiting after your treatment, we encourage you to take your nausea medication as directed.  BELOW ARE SYMPTOMS THAT SHOULD BE REPORTED IMMEDIATELY: *FEVER GREATER THAN 100.4 F (38 C) OR HIGHER *CHILLS OR SWEATING *NAUSEA AND VOMITING THAT IS NOT CONTROLLED WITH YOUR NAUSEA MEDICATION *UNUSUAL SHORTNESS OF BREATH *UNUSUAL BRUISING OR BLEEDING *URINARY PROBLEMS (pain or burning when urinating, or frequent urination) *BOWEL PROBLEMS (unusual diarrhea, constipation, pain near the anus) TENDERNESS IN MOUTH AND THROAT WITH OR WITHOUT PRESENCE OF ULCERS (sore throat, sores in mouth, or a toothache) UNUSUAL RASH, SWELLING OR PAIN  UNUSUAL VAGINAL DISCHARGE OR ITCHING   Items with * indicate a potential emergency and should be followed up as soon as possible or go to the Emergency Department if any problems should occur.  Please show the CHEMOTHERAPY ALERT CARD or IMMUNOTHERAPY ALERT CARD at check-in to the  Emergency Department and triage nurse.  Should you have questions after your visit or need to cancel or reschedule your appointment, please contact [facility]  Dept: [phone]  and follow the prompts.  Office hours are 8:00 a.m. to 4:30 p.m. Monday - Friday. Please note that voicemails left after 4:00 p.m. may not be returned until the following business day.  We are closed weekends and major holidays. You have access to a nurse at all times for urgent questions. Please call the main number to the clinic Dept: [phone] and follow the prompts.   For any non-urgent questions, you may also contact your provider using MyChart. We now offer e-Visits for anyone 83 and older to request care online for non-urgent symptoms. For details visit [email or web address].   Also download the MyChart app! Go to the app store, search "MyChart", open the app, select [facility], and log in with your MyChart username and password.  Masks are optional in the cancer centers. If you would like for your care team to wear a mask while they are taking care of you, please let them know. You may have one support person who is at least 86 years old accompany you for your appointments. Cemiplimab Injection What is this medication? CEMIPLIMAB (se MIP li mab) treats skin cancer and lung cancer. It works by helping your immune system slow or stop the spread of cancer cells. It is a monoclonal antibody. This medicine may be used for other purposes; ask your health care provider or pharmacist if you have questions. COMMON BRAND NAME(S): LIBTAYO What should I tell my  care team before I take this medication? They need to know if you have any of these conditions: Allogeneic stem cell transplant (uses someone else's stem cells) Autoimmune diseases, such as Crohn's disease, ulcerative colitis, or lupus Diabetes Nervous system problems, such as myasthenia gravis or Guillain-Barre syndrome Organ  transplant Recent or ongoing radiation Thyroid disease An unusual or allergic reaction to cemiplimab, other medications, foods, dyes, or preservatives Pregnant or trying to get pregnant Breast-feeding How should I use this medication? This medication is infused into a vein. It is given by your care team in a hospital or clinic setting. A special MedGuide will be given to you before each treatment. Be sure to read this information carefully each time. Talk to your care team about the use of this medication in children. Special care may be needed. Overdosage: If you think you have taken too much of this medicine contact a poison control center or emergency room at once. NOTE: This medicine is only for you. Do not share this medicine with others. What if I miss a dose? Keep appointments for follow-up doses. It is important not to miss your dose. Call your care team if you are unable to keep an appointment. What may interact with this medication? Interactions have not been studied. This list may not describe all possible interactions. Give your health care provider a list of all the medicines, herbs, non-prescription drugs, or dietary supplements you use. Also tell them if you smoke, drink alcohol, or use illegal drugs. Some items may interact with your medicine. What should I watch for while using this medication? This medication may make you feel generally unwell. This is not uncommon as chemotherapy can affect healthy cells as well as cancer cells. Report any side effects. Continue your course of treatment even though you feel ill until your care team tells you to stop. You may need blood work done while you are taking this medication. This medication may cause serious skin reactions. They can happen in the weeks to months after starting the medication. Contact your care team right away if you notice fevers or flu-like symptoms with a rash. The rash may be red or purple and then turn into blisters  or peeling of the skin. You may also notice a red rash with swelling of the face, lips, or lymph nodes in your neck or under your arms. Tell your care team right away if you have any changes in your vision. This medication may increase blood sugar. The risk may be higher in patients who already have diabetes. Ask your care team what you can do to lower your risk of diabetes while taking this medication. Talk to your care team if you wish to become pregnant or think you might be pregnant. This medication can cause serious birth defects if taken during pregnancy. A negative pregnancy test is required before starting this medication. A reliable form of contraception is recommended while taking this medication and for at least 4 months after stopping it. Do not breast-feed while taking this medication and for at least 4 months after stopping it. What side effects may I notice from receiving this medication? Side effects that you should report to your care team as soon as possible: Allergic reactions--skin rash, itching, hives, swelling of the face, lips, tongue, or throat Dry cough, shortness of breath or trouble breathing Eye pain, redness, irritation, or discharge with blurry or decreased vision Heart muscle inflammation--unusual weakness or fatigue, shortness of breath, chest pain, fast or irregular  heartbeat, dizziness, swelling of the ankles, feet, or hands Hormone gland problems--headache, sensitivity to light, unusual weakness or fatigue, dizziness, fast or irregular heartbeat, increased sensitivity to cold or heat, excessive sweating, constipation, hair loss, increased thirst or amount of urine, tremors or shaking, irritability Infusion reactions--chest pain, shortness of breath or trouble breathing, feeling faint or lightheaded Kidney injury (glomerulonephritis)--decrease in the amount of urine, red or dark brown urine, foamy or bubbly urine, swelling of the ankles, hands, or feet Liver  injury--right upper belly pain, loss of appetite, nausea, light-colored stool, dark yellow or brown urine, yellowing skin or eyes, unusual weakness or fatigue Pain, tingling, or numbness in the hands or feet, muscle weakness, change in vision, confusion or trouble speaking, loss of balance or coordination, trouble walking, seizures Rash, fever, and swollen lymph nodes Redness, blistering, peeling, or loosening of the skin, including inside the mouth Sudden or severe stomach pain, bloody diarrhea, fever, nausea, vomiting Side effects that usually do not require medical attention (report these to your care team if they continue or are bothersome): Bone, joint, or muscle pain Diarrhea Fatigue Loss of appetite Nausea Skin rash This list may not describe all possible side effects. Call your doctor for medical advice about side effects. You may report side effects to FDA at 1-800-FDA-1088. Where should I keep my medication? This medication is given in a hospital or clinic and will not be stored at home. NOTE: This sheet is a summary. It may not cover all possible information. If you have questions about this medicine, talk to your doctor, pharmacist, or health care provider.  2023 Elsevier/Gold Standard (2021-08-20 00:00:00)

## 2022-08-18 ENCOUNTER — Other Ambulatory Visit: Payer: Self-pay

## 2022-08-18 LAB — T4: T4, Total: 7.9 ug/dL (ref 4.5–12.0)

## 2022-08-19 ENCOUNTER — Other Ambulatory Visit: Payer: Self-pay

## 2022-08-27 ENCOUNTER — Other Ambulatory Visit: Payer: Self-pay

## 2022-09-07 ENCOUNTER — Inpatient Hospital Stay: Payer: Medicare HMO

## 2022-09-07 ENCOUNTER — Inpatient Hospital Stay: Payer: Medicare HMO | Attending: Physician Assistant | Admitting: Internal Medicine

## 2022-09-07 ENCOUNTER — Other Ambulatory Visit: Payer: Self-pay

## 2022-09-07 VITALS — BP 130/61 | HR 62 | Temp 97.4°F | Resp 17

## 2022-09-07 DIAGNOSIS — Z5112 Encounter for antineoplastic immunotherapy: Secondary | ICD-10-CM | POA: Diagnosis not present

## 2022-09-07 DIAGNOSIS — Z79899 Other long term (current) drug therapy: Secondary | ICD-10-CM | POA: Diagnosis not present

## 2022-09-07 DIAGNOSIS — C3411 Malignant neoplasm of upper lobe, right bronchus or lung: Secondary | ICD-10-CM

## 2022-09-07 LAB — CBC WITH DIFFERENTIAL (CANCER CENTER ONLY)
Abs Immature Granulocytes: 0.01 10*3/uL (ref 0.00–0.07)
Basophils Absolute: 0 10*3/uL (ref 0.0–0.1)
Basophils Relative: 0 %
Eosinophils Absolute: 0.1 10*3/uL (ref 0.0–0.5)
Eosinophils Relative: 3 %
HCT: 32.6 % — ABNORMAL LOW (ref 39.0–52.0)
Hemoglobin: 10.8 g/dL — ABNORMAL LOW (ref 13.0–17.0)
Immature Granulocytes: 0 %
Lymphocytes Relative: 25 %
Lymphs Abs: 0.9 10*3/uL (ref 0.7–4.0)
MCH: 30.1 pg (ref 26.0–34.0)
MCHC: 33.1 g/dL (ref 30.0–36.0)
MCV: 90.8 fL (ref 80.0–100.0)
Monocytes Absolute: 0.3 10*3/uL (ref 0.1–1.0)
Monocytes Relative: 8 %
Neutro Abs: 2.3 10*3/uL (ref 1.7–7.7)
Neutrophils Relative %: 64 %
Platelet Count: 156 10*3/uL (ref 150–400)
RBC: 3.59 MIL/uL — ABNORMAL LOW (ref 4.22–5.81)
RDW: 14.9 % (ref 11.5–15.5)
WBC Count: 3.6 10*3/uL — ABNORMAL LOW (ref 4.0–10.5)
nRBC: 0 % (ref 0.0–0.2)

## 2022-09-07 LAB — TSH: TSH: 3.916 u[IU]/mL (ref 0.350–4.500)

## 2022-09-07 LAB — CMP (CANCER CENTER ONLY)
ALT: 7 U/L (ref 0–44)
AST: 10 U/L — ABNORMAL LOW (ref 15–41)
Albumin: 3.2 g/dL — ABNORMAL LOW (ref 3.5–5.0)
Alkaline Phosphatase: 66 U/L (ref 38–126)
Anion gap: 5 (ref 5–15)
BUN: 42 mg/dL — ABNORMAL HIGH (ref 8–23)
CO2: 26 mmol/L (ref 22–32)
Calcium: 8.8 mg/dL — ABNORMAL LOW (ref 8.9–10.3)
Chloride: 106 mmol/L (ref 98–111)
Creatinine: 2.6 mg/dL — ABNORMAL HIGH (ref 0.61–1.24)
GFR, Estimated: 23 mL/min — ABNORMAL LOW (ref >60)
Glucose, Bld: 98 mg/dL (ref 70–99)
Potassium: 4.4 mmol/L (ref 3.5–5.1)
Sodium: 137 mmol/L (ref 135–145)
Total Bilirubin: 0.4 mg/dL (ref 0.3–1.2)
Total Protein: 7.2 g/dL (ref 6.5–8.1)

## 2022-09-07 MED ORDER — SODIUM CHLORIDE 0.9 % IV SOLN
350.0000 mg | Freq: Once | INTRAVENOUS | Status: AC
  Administered 2022-09-07: 350 mg via INTRAVENOUS
  Filled 2022-09-07: qty 7

## 2022-09-07 MED ORDER — SODIUM CHLORIDE 0.9 % IV SOLN: Freq: Once | INTRAVENOUS | Status: AC

## 2022-09-07 NOTE — Progress Notes (Signed)
Per Dr Julien Nordmann ,it is okay to treat [name] today with Libtayo and creatinine of 2.6.

## 2022-09-07 NOTE — Progress Notes (Signed)
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  Biomarker Findings Tumor Mutational Burden - 10 Muts/Mb Microsatellite status - MS-Stable Genomic Findings For a complete list of the genes assayed, please refer to the Appendix. KRAS G12C NFKBIA amplification NKX2-1 amplification RBM10 D63f*80 7 Disease relevant genes with no reportable alterations: ALK, BRAF, EGFR, ERBB2, MET, RET, ROS1   PDL1: 90%   PRIOR THERAPY:  Weekly concurrent chemoradiation with carboplatin for an AUC of 2 and paclitaxel 45 mg/m.  First dose on 12/30/2020.  Status post 6 cycles.   CURRENT THERAPY: First-line treatment with immunotherapy with Libtayo (Cempilimab) 350 Mg IV every 3 weeks.  Status post 25 cycles.  INTERVAL HISTORY: Brian ELTRINGHAM88y.o. male returns to the clinic today for follow-up visit accompanied by his son-in-law.  The patient is feeling fine today with no concerning complaints.  He denied having any chest pain, shortness of breath, cough or hemoptysis.  He has no nausea, vomiting, diarrhea or constipation.  He has no headache or visual changes.  He denied having any recent weight loss or night sweats.  He has been tolerating his treatment with Libtayo (Cempilimab) fairly well except for the vitiligo.  He is here today for evaluation before starting cycle #26.  MEDICAL HISTORY: Past Medical History:  Diagnosis Date   Arthritis    Hypertension    Hyperthyroidism    lung ca 11/2020    ALLERGIES:  has No Known Allergies.  MEDICATIONS:  Current Outpatient Medications   Medication Sig Dispense Refill   amLODipine (NORVASC) 10 MG tablet Take 10 mg by mouth daily.     gabapentin (NEURONTIN) 250 MG/5ML solution Take 3 mLs (150 mg total) by mouth at bedtime. 90 mL 2   hydrochlorothiazide (MICROZIDE) 12.5 MG capsule Take 12.5 mg by mouth daily.     labetalol (NORMODYNE) 200 MG tablet Take 200 mg by mouth 2 (two) times daily.     levothyroxine (SYNTHROID) 150 MCG tablet Take 150 mcg by mouth daily.     levothyroxine (SYNTHROID) 175 MCG tablet Take 1 tablet (175 mcg total) by mouth daily before breakfast. 30 tablet 2   loperamide (IMODIUM) 2 MG capsule Take by mouth as needed for diarrhea or loose stools.     naproxen (NAPROSYN) 500 MG tablet Take 500 mg by mouth 2 (two) times daily as needed for moderate pain.     oxyCODONE-acetaminophen (PERCOCET/ROXICET) 5-325 MG tablet Take 1 tablet by mouth every 8 (eight) hours as needed for severe pain. 30 tablet 0   prochlorperazine (COMPAZINE) 10 MG tablet Take 1 tablet (10 mg total) by mouth every 6 (six) hours as needed for nausea or vomiting. 30 tablet 2   tadalafil (CIALIS) 5 MG tablet Take 5 mg by mouth daily.     tamsulosin (FLOMAX) 0.4 MG CAPS capsule Take 0.4 mg by mouth daily.     No current facility-administered medications for this visit.    SURGICAL HISTORY:  Past Surgical History:  Procedure Laterality Date   COLONOSCOPY N/A 09/02/2016   Procedure: COLONOSCOPY;  Surgeon: NRogene Houston MD;  Location: [facility];  Service: Endoscopy;  Laterality: N/A;  830   NO PAST SURGERIES     POLYPECTOMY  09/02/2016   Procedure: POLYPECTOMY;  Surgeon: Rogene Houston, MD;  Location: [facility];  Service: Endoscopy;;    REVIEW OF SYSTEMS:  A comprehensive review of systems was negative except for: Constitutional: positive for fatigue   PHYSICAL EXAMINATION: General appearance: alert, cooperative, and no distress Head: Normocephalic, without obvious abnormality, atraumatic Neck: no adenopathy, no JVD, supple,  symmetrical, trachea midline, and thyroid not enlarged, symmetric, no tenderness/mass/nodules Lymph nodes: Cervical, supraclavicular, and axillary nodes normal. Resp: clear to auscultation bilaterally Back: symmetric, no curvature. ROM normal. No CVA tenderness. Cardio: regular rate and rhythm, S1, S2 normal, no murmur, click, rub or gallop GI: soft, non-tender; bowel sounds normal; no masses,  no organomegaly Extremities: extremities normal, atraumatic, no cyanosis or edema        ECOG PERFORMANCE STATUS: 1 - Symptomatic but completely ambulatory  Blood pressure 123/61, pulse 93, temperature (!) 97.5 F (36.4 C), temperature source Oral, resp. rate 16, weight 159 lb (72.1 kg), SpO2 100 %.  LABORATORY DATA: Lab Results  Component Value Date   WBC 3.6 (L) 09/07/2022   HGB 10.8 (L) 09/07/2022   HCT 32.6 (L) 09/07/2022   MCV 90.8 09/07/2022   PLT 156 09/07/2022      Chemistry      Component Value Date/Time   NA 137 09/07/2022 1050   K 4.4 09/07/2022 1050   CL 106 09/07/2022 1050   CO2 26 09/07/2022 1050   BUN 42 (H) 09/07/2022 1050   CREATININE 2.60 (H) 09/07/2022 1050      Component Value Date/Time   CALCIUM 8.8 (L) 09/07/2022 1050   ALKPHOS 66 09/07/2022 1050   AST 10 (L) 09/07/2022 1050   ALT 7 09/07/2022 1050   BILITOT 0.4 09/07/2022 1050       RADIOGRAPHIC STUDIES: CT Chest Wo Contrast  Result Date: 08/16/2022 CLINICAL DATA:  Non-small cell lung cancer staging. Surveillance exam. * Tracking Code: BO * EXAM: CT CHEST, ABDOMEN AND PELVIS WITHOUT CONTRAST TECHNIQUE: Multidetector CT imaging of the chest, abdomen and pelvis was performed following the standard protocol without IV contrast. RADIATION DOSE REDUCTION: This exam was performed according to the departmental dose-optimization program which includes automated exposure control, adjustment of the mA and/or kV according to patient size and/or use of iterative reconstruction technique. COMPARISON:  05/21/2022  FINDINGS: CT CHEST FINDINGS Cardiovascular: Coronary artery calcification and aortic atherosclerotic calcification. Mediastinum/Nodes: Enlarged thyroid gland extends into the RIGHT upper paratracheal mediastinum. Borderline enlarged mediastinal lymph nodes again noted. For example 10 mm nodule AP window (image 25/503) Lungs/Pleura: Post radiation change in the lateral aspect of the RIGHT upper lobe not changed from comparison exam. Perihilar bronchiectasis in the RIGHT upper lobe also unchanged. No new or suspicious pulmonary nodules in LEFT or RIGHT lung. Musculoskeletal: No aggressive osseous lesion CT ABDOMEN PELVIS FINDINGS Hepatobiliary: No focal hepatic lesion on noncontrast exam Pancreas: Normal pancreas Spleen: Low-density lesion in the spleen unchanged from multiple comparison exams. Adrenals/Urinary Tract: Adrenal glands normal. Bilateral renal cysts unchanged. Ureters normal. Bladder is mildly thick-walled. Prostate gland enlarged. Stomach/Bowel: Stomach, small bowel, appendix, and cecum are normal. Multiple diverticula of the descending colon and sigmoid colon without acute inflammation. Vascular/Lymphatic: Abdominal aorta is normal caliber with atherosclerotic calcification. There is no retroperitoneal or periportal lymphadenopathy. No pelvic lymphadenopathy. Reproductive: Prostate enlarged Other: No free-fluid.  No peritoneal nodularity Musculoskeletal: No evidence skeletal metastasis. Degenerate spurring of the spine. IMPRESSION:  Chest Impression: 1. Stable post radiation change in the RIGHT upper lobe. 2. No new or progressive lung carcinoma identified within lungs. 3. Extensive centrilobular emphysema. 4. Prominent mediastinal lymph nodes are unchanged. Abdomen / Pelvis Impression: 1. No evidence of metastatic disease in the abdomen pelvis. 2. No skeletal metastasis. Electronically Signed   By: Suzy Bouchard M.D.   On: 08/16/2022 17:26   CT Abdomen Pelvis Wo Contrast  Result Date:  08/16/2022 CLINICAL DATA:  Non-small cell lung cancer staging. Surveillance exam. * Tracking Code: BO * EXAM: CT CHEST, ABDOMEN AND PELVIS WITHOUT CONTRAST TECHNIQUE: Multidetector CT imaging of the chest, abdomen and pelvis was performed following the standard protocol without IV contrast. RADIATION DOSE REDUCTION: This exam was performed according to the departmental dose-optimization program which includes automated exposure control, adjustment of the mA and/or kV according to patient size and/or use of iterative reconstruction technique. COMPARISON:  05/21/2022 FINDINGS: CT CHEST FINDINGS Cardiovascular: Coronary artery calcification and aortic atherosclerotic calcification. Mediastinum/Nodes: Enlarged thyroid gland extends into the RIGHT upper paratracheal mediastinum. Borderline enlarged mediastinal lymph nodes again noted. For example 10 mm nodule AP window (image 25/503) Lungs/Pleura: Post radiation change in the lateral aspect of the RIGHT upper lobe not changed from comparison exam. Perihilar bronchiectasis in the RIGHT upper lobe also unchanged. No new or suspicious pulmonary nodules in LEFT or RIGHT lung. Musculoskeletal: No aggressive osseous lesion CT ABDOMEN PELVIS FINDINGS Hepatobiliary: No focal hepatic lesion on noncontrast exam Pancreas: Normal pancreas Spleen: Low-density lesion in the spleen unchanged from multiple comparison exams. Adrenals/Urinary Tract: Adrenal glands normal. Bilateral renal cysts unchanged. Ureters normal. Bladder is mildly thick-walled. Prostate gland enlarged. Stomach/Bowel: Stomach, small bowel, appendix, and cecum are normal. Multiple diverticula of the descending colon and sigmoid colon without acute inflammation. Vascular/Lymphatic: Abdominal aorta is normal caliber with atherosclerotic calcification. There is no retroperitoneal or periportal lymphadenopathy. No pelvic lymphadenopathy. Reproductive: Prostate enlarged Other: No free-fluid.  No peritoneal nodularity  Musculoskeletal: No evidence skeletal metastasis. Degenerate spurring of the spine. IMPRESSION: Chest Impression: 1. Stable post radiation change in the RIGHT upper lobe. 2. No new or progressive lung carcinoma identified within lungs. 3. Extensive centrilobular emphysema. 4. Prominent mediastinal lymph nodes are unchanged. Abdomen / Pelvis Impression: 1. No evidence of metastatic disease in the abdomen pelvis. 2. No skeletal metastasis. Electronically Signed   By: Suzy Bouchard M.D.   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  The patient underwent a course of concurrent chemoradiation with weekly carboplatin for AUC of 2 and paclitaxel 45 Mg/M2 status post 6 cycles. He tolerated the previous course of his concurrent chemoradiation fairly well except for fatigue. Unfortunately his CT scan of the chest after the induction phase showed interval enlargement of a large mass centered at the periphery of the right upper lobe and extending into the adjacent chest wall measuring 8.4 x 6.7 cm with bony destruction of the right third and fourth ribs substantially increased compared to the prior examination and the findings are consistent with worsened malignancy. The patient has PD-L1 expression of 90% and I felt he will be a good candidate for treatment with first-line single agent  immunotherapy. I recommended for him treatment with Libtayo (Cempilimab) 350 Mg IV every 3 weeks.  He is status post 25 cycles of treatment. The patient has been tolerating this treatment well with no concerning adverse effect except for  the immunotherapy mediated vitiligo. I recommended for him to proceed with cycle #26 today as planned. I will see him back for follow-up visit in 3 weeks for evaluation before starting cycle #27. For the hypothyroidism, the patient will continue his current treatment with levothyroxine. He was advised to call immediately if he has any other concerning symptoms in the interval. The patient voices understanding of current disease status and treatment options and is in agreement with the current care plan.  All questions were answered. The patient knows to call the clinic with any problems, questions or concerns. We can certainly see the patient much sooner if necessary.   Disclaimer: This note was dictated with voice recognition software. Similar sounding words can inadvertently be transcribed and may not be corrected upon review.

## 2022-09-07 NOTE — Patient Instructions (Signed)
[facility]  Discharge Instructions: Thank you for choosing [facility] to provide your oncology and hematology care.   If you have a lab appointment with the [facility], please go directly to the [facility] and check in at the registration area.   Wear comfortable clothing and clothing appropriate for easy access to any Portacath or PICC line.   We strive to give you quality time with your provider. You may need to reschedule your appointment if you arrive late (15 or more minutes).  Arriving late affects you and other patients whose appointments are after yours.  Also, if you miss three or more appointments without notifying the office, you may be dismissed from the clinic at the provider's discretion.      For prescription refill requests, have your pharmacy contact our office and allow 72 hours for refills to be completed.    Today you received the following chemotherapy and/or immunotherapy agent: Libtayo   To help prevent nausea and vomiting after your treatment, we encourage you to take your nausea medication as directed.  BELOW ARE SYMPTOMS THAT SHOULD BE REPORTED IMMEDIATELY: *FEVER GREATER THAN 100.4 F (38 C) OR HIGHER *CHILLS OR SWEATING *NAUSEA AND VOMITING THAT IS NOT CONTROLLED WITH YOUR NAUSEA MEDICATION *UNUSUAL SHORTNESS OF BREATH *UNUSUAL BRUISING OR BLEEDING *URINARY PROBLEMS (pain or burning when urinating, or frequent urination) *BOWEL PROBLEMS (unusual diarrhea, constipation, pain near the anus) TENDERNESS IN MOUTH AND THROAT WITH OR WITHOUT PRESENCE OF ULCERS (sore throat, sores in mouth, or a toothache) UNUSUAL RASH, SWELLING OR PAIN  UNUSUAL VAGINAL DISCHARGE OR ITCHING   Items with * indicate a potential emergency and should be followed up as soon as possible or go to the Emergency Department if any problems should occur.  Please show the CHEMOTHERAPY ALERT CARD or IMMUNOTHERAPY ALERT CARD at check-in to the  Emergency Department and triage nurse.  Should you have questions after your visit or need to cancel or reschedule your appointment, please contact [facility]  Dept: [phone]  and follow the prompts.  Office hours are 8:00 a.m. to 4:30 p.m. Monday - Friday. Please note that voicemails left after 4:00 p.m. may not be returned until the following business day.  We are closed weekends and major holidays. You have access to a nurse at all times for urgent questions. Please call the main number to the clinic Dept: [phone] and follow the prompts.   For any non-urgent questions, you may also contact your provider using MyChart. We now offer e-Visits for anyone 78 and older to request care online for non-urgent symptoms. For details visit [email or web address].   Also download the MyChart app! Go to the app store, search "MyChart", open the app, select [facility], and log in with your MyChart username and password.  Masks are optional in the cancer centers. If you would like for your care team to wear a mask while they are taking care of you, please let them know. You may have one support person who is at least 86 years old accompany you for your appointments. Cemiplimab Injection What is this medication? CEMIPLIMAB (se MIP li mab) treats skin cancer and lung cancer. It works by helping your immune system slow or stop the spread of cancer cells. It is a monoclonal antibody. This medicine may be used for other purposes; ask your health care provider or pharmacist if you have questions. COMMON BRAND NAME(S): LIBTAYO What should I tell my  care team before I take this medication? They need to know if you have any of these conditions: Allogeneic stem cell transplant (uses someone else's stem cells) Autoimmune diseases, such as Crohn's disease, ulcerative colitis, or lupus Diabetes Nervous system problems, such as myasthenia gravis or Guillain-Barre syndrome Organ  transplant Recent or ongoing radiation Thyroid disease An unusual or allergic reaction to cemiplimab, other medications, foods, dyes, or preservatives Pregnant or trying to get pregnant Breast-feeding How should I use this medication? This medication is infused into a vein. It is given by your care team in a hospital or clinic setting. A special MedGuide will be given to you before each treatment. Be sure to read this information carefully each time. Talk to your care team about the use of this medication in children. Special care may be needed. Overdosage: If you think you have taken too much of this medicine contact a poison control center or emergency room at once. NOTE: This medicine is only for you. Do not share this medicine with others. What if I miss a dose? Keep appointments for follow-up doses. It is important not to miss your dose. Call your care team if you are unable to keep an appointment. What may interact with this medication? Interactions have not been studied. This list may not describe all possible interactions. Give your health care provider a list of all the medicines, herbs, non-prescription drugs, or dietary supplements you use. Also tell them if you smoke, drink alcohol, or use illegal drugs. Some items may interact with your medicine. What should I watch for while using this medication? This medication may make you feel generally unwell. This is not uncommon as chemotherapy can affect healthy cells as well as cancer cells. Report any side effects. Continue your course of treatment even though you feel ill until your care team tells you to stop. You may need blood work done while you are taking this medication. This medication may cause serious skin reactions. They can happen in the weeks to months after starting the medication. Contact your care team right away if you notice fevers or flu-like symptoms with a rash. The rash may be red or purple and then turn into blisters  or peeling of the skin. You may also notice a red rash with swelling of the face, lips, or lymph nodes in your neck or under your arms. Tell your care team right away if you have any changes in your vision. This medication may increase blood sugar. The risk may be higher in patients who already have diabetes. Ask your care team what you can do to lower your risk of diabetes while taking this medication. Talk to your care team if you wish to become pregnant or think you might be pregnant. This medication can cause serious birth defects if taken during pregnancy. A negative pregnancy test is required before starting this medication. A reliable form of contraception is recommended while taking this medication and for at least 4 months after stopping it. Do not breast-feed while taking this medication and for at least 4 months after stopping it. What side effects may I notice from receiving this medication? Side effects that you should report to your care team as soon as possible: Allergic reactions--skin rash, itching, hives, swelling of the face, lips, tongue, or throat Dry cough, shortness of breath or trouble breathing Eye pain, redness, irritation, or discharge with blurry or decreased vision Heart muscle inflammation--unusual weakness or fatigue, shortness of breath, chest pain, fast or irregular  heartbeat, dizziness, swelling of the ankles, feet, or hands Hormone gland problems--headache, sensitivity to light, unusual weakness or fatigue, dizziness, fast or irregular heartbeat, increased sensitivity to cold or heat, excessive sweating, constipation, hair loss, increased thirst or amount of urine, tremors or shaking, irritability Infusion reactions--chest pain, shortness of breath or trouble breathing, feeling faint or lightheaded Kidney injury (glomerulonephritis)--decrease in the amount of urine, red or dark brown urine, foamy or bubbly urine, swelling of the ankles, hands, or feet Liver  injury--right upper belly pain, loss of appetite, nausea, light-colored stool, dark yellow or brown urine, yellowing skin or eyes, unusual weakness or fatigue Pain, tingling, or numbness in the hands or feet, muscle weakness, change in vision, confusion or trouble speaking, loss of balance or coordination, trouble walking, seizures Rash, fever, and swollen lymph nodes Redness, blistering, peeling, or loosening of the skin, including inside the mouth Sudden or severe stomach pain, bloody diarrhea, fever, nausea, vomiting Side effects that usually do not require medical attention (report these to your care team if they continue or are bothersome): Bone, joint, or muscle pain Diarrhea Fatigue Loss of appetite Nausea Skin rash This list may not describe all possible side effects. Call your doctor for medical advice about side effects. You may report side effects to FDA at 1-800-FDA-1088. Where should I keep my medication? This medication is given in a hospital or clinic and will not be stored at home. NOTE: This sheet is a summary. It may not cover all possible information. If you have questions about this medicine, talk to your doctor, pharmacist, or health care provider.  2023 Elsevier/Gold Standard (2021-08-20 00:00:00)

## 2022-09-09 LAB — T4: T4, Total: 7.3 ug/dL (ref 4.5–12.0)

## 2022-09-24 NOTE — Progress Notes (Signed)
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   Biomarker Findings Tumor Mutational Burden - 10 Muts/Mb Microsatellite status - MS-Stable Genomic Findings For a complete list of the genes assayed, please refer to the Appendix. KRAS G12C NFKBIA amplification NKX2-1 amplification RBM10 D677f*80 7 Disease relevant genes with no reportable alterations: ALK, BRAF, EGFR, ERBB2, MET, RET, ROS1   PDL1: 90%  PRIOR THERAPY: Weekly concurrent chemoradiation with carboplatin for an AUC of 2 and paclitaxel 45 mg/m.  First dose on 12/30/2020.  Status post 6 cycles    CURRENT THERAPY: First-line treatment with immunotherapy with Libtayo (Cempilimab) 350 Mg IV every 3 weeks. First dose on 03/19/21. Status post 26 cycles.    INTERVAL HISTORY: Brian BANNISTER88y.[email or web address]. male returns to the clinic today for a follow-up visit accompanied by his son-in-law.  The patient is feeling fairly well today without any concern complaints. He is tolerating his treatment well except he developed immunotherapy mediated vitiligo. He feels well and denies changes in his health since last being seen except a few days ago he had a bout of a "stomach bug". He hd some diarrhea 3x per day for about 2 days and 1 episode of vomiting. He denies any blood in the stool or abdominal pain. His bowel have returned to normal and he is at his baseline. He denies any fever, chills, or night sweats.    His appetite is otherwise good besides when he had diarrhea a few days ago. He reports stable fatigue. He reports dyspnea on exertion which is stable.  He denies significant cough.  Denies any chest pain or hemoptysis. Denies any headache or visual changes. He has CKD and follows with his nephrologist. He believes he may have an upcoming appointment next month. Denies any recent medication changes. He is here today for evaluation and review blood work before undergoing cycle #27.   MEDICAL HISTORY: Past Medical History:  Diagnosis Date   Arthritis    Hypertension    Hyperthyroidism    lung ca 11/2020    ALLERGIES:  has No Known Allergies.  MEDICATIONS:  Current Outpatient Medications  Medication Sig Dispense Refill   amLODipine (NORVASC) 10 MG tablet Take 10 mg by mouth daily.     gabapentin (NEURONTIN) 250 MG/5ML solution Take 3 mLs (150 mg total) by mouth at bedtime. 90 mL 2   hydrochlorothiazide (MICROZIDE) 12.5 MG capsule Take 12.5 mg by mouth daily.     labetalol (NORMODYNE) 200 MG tablet Take 200 mg by mouth 2 (two) times daily.     levothyroxine (SYNTHROID) 150 MCG tablet Take 150 mcg by mouth daily.     levothyroxine (SYNTHROID) 175 MCG tablet Take 1 tablet (175 mcg total) by mouth daily before breakfast. 30 tablet 2   loperamide (IMODIUM) 2 MG capsule Take by mouth as needed for diarrhea or loose stools.     naproxen (NAPROSYN) 500 MG tablet Take 500 mg by mouth 2 (two) times daily as needed for moderate pain.     oxyCODONE-acetaminophen (PERCOCET/ROXICET) 5-325 MG tablet Take 1 tablet by mouth every 8 (eight) hours as needed for severe pain. 30 tablet 0   prochlorperazine (COMPAZINE)  10 MG tablet Take 1 tablet (10 mg total) by mouth every 6 (six) hours as needed for nausea or vomiting. 30 tablet 2   tadalafil (CIALIS) 5 MG tablet Take 5 mg by mouth daily.     tamsulosin (FLOMAX) 0.4 MG CAPS capsule Take 0.4 mg by mouth daily.     No current facility-administered medications for this visit.    SURGICAL HISTORY:  Past Surgical History:  Procedure Laterality Date   COLONOSCOPY N/A 09/02/2016   Procedure: COLONOSCOPY;  Surgeon: Rogene Houston, MD;   Location: [facility];  Service: Endoscopy;  Laterality: N/A;  830   NO PAST SURGERIES     POLYPECTOMY  09/02/2016   Procedure: POLYPECTOMY;  Surgeon: Rogene Houston, MD;  Location: [facility];  Service: Endoscopy;;    REVIEW OF SYSTEMS:   Constitutional: Positive for stable fatigue. Negative for appetite change, chills, fever and unexpected weight change.  HENT: Negative for mouth sores, nosebleeds, sore throat and trouble swallowing.   Eyes: Negative for eye problems and icterus.  Respiratory: Positive for stable shortness of breath with exertion. Negative for hemoptysis, cough and wheezing.  Cardiovascular: Negative for chest pain and leg swelling.  Gastrointestinal: Negative for abdominal pain, constipation, diarrhea (resolved-none at this time), nausea and vomiting (none at this time).  Genitourinary: Negative for bladder incontinence, difficulty urinating, dysuria, frequency and hematuria.   Musculoskeletal: Negative for back pain, gait problem, neck pain and neck stiffness.  Skin: Positive for hypopigmentation on face/vitiligo .  Neurological: Negative for dizziness, extremity weakness, gait problem, headaches, light-headedness and seizures.  Hematological: Negative for adenopathy. Does not bruise/bleed easily.  Psychiatric/Behavioral: Negative for confusion, depression and sleep disturbance. The patient is not nervous/anxious.     PHYSICAL EXAMINATION:  Blood pressure 105/65, pulse 61, temperature 97.7 F (36.5 C), temperature source Temporal, resp. rate 16, height _0  (1.88 m), weight 155 lb 12.8 oz (70.7 kg), SpO2 97 %.  ECOG PERFORMANCE STATUS: 1-2  Physical Exam  Constitutional: Oriented to person, place, and time and thin appearing male and in no distress.  HENT:  Head: Normocephalic and atraumatic.  Mouth/Throat: Oropharynx is clear and moist. No oropharyngeal exudate.  No evidence of thrush Eyes: Conjunctivae are normal. Right eye exhibits no discharge. Left eye  exhibits no discharge. No scleral icterus.  Neck: Normal range of motion. Neck supple.  Cardiovascular: Normal rate, regular rhythm, normal heart sounds and intact distal pulses.   Pulmonary/Chest: Effort normal and breath sounds normal. No respiratory distress. No wheezes. No rales.  Abdominal: Soft. Bowel sounds are normal. Exhibits no distension and no mass. There is no tenderness.  Musculoskeletal: Normal range of motion. Exhibits no edema.  Lymphadenopathy:    No cervical adenopathy.  Neurological: Alert and oriented to person, place, and time. Exhibits muscle wasting.  The patient was examined in the wheelchair. Skin: Hypopigmentation on his face and upper torso and hands. Skin is warm and dry. Not diaphoretic. No erythema. No pallor.  Psychiatric: Mood, memory and judgment normal.  Vitals reviewed.  LABORATORY DATA: Lab Results  Component Value Date   WBC 3.0 (L) 09/29/2022   HGB 11.0 (L) 09/29/2022   HCT 33.3 (L) 09/29/2022   MCV 90.2 09/29/2022   PLT 170 09/29/2022      Chemistry      Component Value Date/Time   NA 137 09/29/2022 1036   K 4.1 09/29/2022 1036   CL 106 09/29/2022 1036   CO2 25 09/29/2022 1036   BUN 36 (H) 09/29/2022  1036   CREATININE 2.75 (H) 09/29/2022 1036      Component Value Date/Time   CALCIUM 8.7 (L) 09/29/2022 1036   ALKPHOS 58 09/29/2022 1036   AST 15 09/29/2022 1036   ALT 19 09/29/2022 1036   BILITOT 0.5 09/29/2022 1036       RADIOGRAPHIC STUDIES:  No results found.   ASSESSMENT/PLAN:  This is a very pleasant 88 year old African-American male diagnosed with a stage IVa (T4, N3, M1 B) non-small cell lung cancer, adenosquamous carcinoma in March 2022 and presented with large mass involving the lateral right upper lobe and right chest wall soft tissue in addition to right hilar, mediastinal and right subpectoral and supraclavicular lymphadenopathy with PD-L1 expression of 90%.   The patient is status post a course of concurrent  chemoradiation with carboplatin for an AUC of 2 and paclitaxel 45 mg per metered square.  He is status post 6 cycles.   The patient had evidence of disease progression following concurrent chemoradiation.   The patient is currently undergoing single agent immunotherapy with Libtayo IV every 3 weeks due to his PD-L1 expression of 90%.  The patient is status post 24 cycles.  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  He was seen by Dr. Denna Haggard by dermatology.  The patient was seen with Dr. Julien Nordmann today.  Labs were reviewed. The patient has baseline CKD. His creatinine is up a little bit today. He had diarrhea a few days ago. He may be a little dehydrated and he does not drink a lot of water. His creatinine is 2.75 today. Will arrange for additional IVF today. He was also instructed to hydrate well at home. Recommend that he proceed with cycle #27 today scheduled.   We will see him back for follow-up visit in 3 weeks for evaluation and repeat blood work before undergoing cycle #28.  We have previously had a discussion about his vitiligo and his immunotherapy and the decision to continue. Given his age and CKD, alternative treatments would likely be challenging for this patient. Therefore, he opted to continue on his current treatment. Of note, he does have KRAS G12C mutation which can be used in the second line setting in the future.     The patient was advised to call immediately if he has any concerning symptoms in the interval. The patient voices understanding of current disease status and treatment options and is in agreement with the current care plan. All questions were answered. The patient knows to call the clinic with any problems, questions or concerns. We can certainly see the patient much sooner if necessary   No orders of the defined types were placed in this encounter.   The total time spent in the appointment was 20-29  minutes.   Barnell Shieh L Azari Hasler, PA-C 09/29/22

## 2022-09-29 ENCOUNTER — Inpatient Hospital Stay: Payer: Medicare HMO | Attending: Physician Assistant

## 2022-09-29 ENCOUNTER — Inpatient Hospital Stay: Payer: Medicare HMO

## 2022-09-29 ENCOUNTER — Inpatient Hospital Stay (HOSPITAL_BASED_OUTPATIENT_CLINIC_OR_DEPARTMENT_OTHER): Payer: Medicare HMO | Admitting: Physician Assistant

## 2022-09-29 VITALS — BP 105/65 | HR 61 | Temp 97.7°F | Resp 16 | Ht 74.0 in | Wt 155.8 lb

## 2022-09-29 VITALS — BP 118/64 | HR 65 | Temp 98.1°F | Resp 17

## 2022-09-29 DIAGNOSIS — Z5112 Encounter for antineoplastic immunotherapy: Secondary | ICD-10-CM | POA: Diagnosis not present

## 2022-09-29 DIAGNOSIS — N189 Chronic kidney disease, unspecified: Secondary | ICD-10-CM | POA: Insufficient documentation

## 2022-09-29 DIAGNOSIS — C3411 Malignant neoplasm of upper lobe, right bronchus or lung: Secondary | ICD-10-CM | POA: Diagnosis not present

## 2022-09-29 DIAGNOSIS — E860 Dehydration: Secondary | ICD-10-CM

## 2022-09-29 DIAGNOSIS — C7989 Secondary malignant neoplasm of other specified sites: Secondary | ICD-10-CM | POA: Insufficient documentation

## 2022-09-29 DIAGNOSIS — Z79899 Other long term (current) drug therapy: Secondary | ICD-10-CM | POA: Diagnosis not present

## 2022-09-29 DIAGNOSIS — R197 Diarrhea, unspecified: Secondary | ICD-10-CM | POA: Diagnosis not present

## 2022-09-29 LAB — CBC WITH DIFFERENTIAL (CANCER CENTER ONLY)
Abs Immature Granulocytes: 0.01 10*3/uL (ref 0.00–0.07)
Basophils Absolute: 0 10*3/uL (ref 0.0–0.1)
Basophils Relative: 0 %
Eosinophils Absolute: 0.1 10*3/uL (ref 0.0–0.5)
Eosinophils Relative: 2 %
HCT: 33.3 % — ABNORMAL LOW (ref 39.0–52.0)
Hemoglobin: 11 g/dL — ABNORMAL LOW (ref 13.0–17.0)
Immature Granulocytes: 0 %
Lymphocytes Relative: 23 %
Lymphs Abs: 0.7 10*3/uL (ref 0.7–4.0)
MCH: 29.8 pg (ref 26.0–34.0)
MCHC: 33 g/dL (ref 30.0–36.0)
MCV: 90.2 fL (ref 80.0–100.0)
Monocytes Absolute: 0.4 10*3/uL (ref 0.1–1.0)
Monocytes Relative: 14 %
Neutro Abs: 1.9 10*3/uL (ref 1.7–7.7)
Neutrophils Relative %: 61 %
Platelet Count: 170 10*3/uL (ref 150–400)
RBC: 3.69 MIL/uL — ABNORMAL LOW (ref 4.22–5.81)
RDW: 14.9 % (ref 11.5–15.5)
WBC Count: 3 10*3/uL — ABNORMAL LOW (ref 4.0–10.5)
nRBC: 0 % (ref 0.0–0.2)

## 2022-09-29 LAB — CMP (CANCER CENTER ONLY)
ALT: 19 U/L (ref 0–44)
AST: 15 U/L (ref 15–41)
Albumin: 3.2 g/dL — ABNORMAL LOW (ref 3.5–5.0)
Alkaline Phosphatase: 58 U/L (ref 38–126)
Anion gap: 6 (ref 5–15)
BUN: 36 mg/dL — ABNORMAL HIGH (ref 8–23)
CO2: 25 mmol/L (ref 22–32)
Calcium: 8.7 mg/dL — ABNORMAL LOW (ref 8.9–10.3)
Chloride: 106 mmol/L (ref 98–111)
Creatinine: 2.75 mg/dL — ABNORMAL HIGH (ref 0.61–1.24)
GFR, Estimated: 22 mL/min — ABNORMAL LOW (ref >60)
Glucose, Bld: 91 mg/dL (ref 70–99)
Potassium: 4.1 mmol/L (ref 3.5–5.1)
Sodium: 137 mmol/L (ref 135–145)
Total Bilirubin: 0.5 mg/dL (ref 0.3–1.2)
Total Protein: 7.5 g/dL (ref 6.5–8.1)

## 2022-09-29 LAB — TSH: TSH: 6.09 u[IU]/mL — ABNORMAL HIGH (ref 0.350–4.500)

## 2022-09-29 MED ORDER — SODIUM CHLORIDE 0.9 % IV SOLN
350.0000 mg | Freq: Once | INTRAVENOUS | Status: AC
  Administered 2022-09-29: 350 mg via INTRAVENOUS
  Filled 2022-09-29: qty 7

## 2022-09-29 MED ORDER — SODIUM CHLORIDE 0.9 % IV SOLN: Freq: Once | INTRAVENOUS | Status: AC

## 2022-09-29 NOTE — Progress Notes (Addendum)
Ok per [place], [place] to treat with creat 2.75 mg/dL, giving fluids in infusion with treatment. Per Cassie, PA stop fluids no matter how much is remaining once Libtayo is finished.

## 2022-09-29 NOTE — Patient Instructions (Signed)
[facility]COL[facility]  Discharge Instructions: Thank you for choosing [facility] to provide your oncology and hematology care.   If you have a lab appointment with the [facility], please go directly to the [facility] and check in at the registration area.   Wear comfortable clothing and clothing appropriate for easy access to any Portacath or PICC line.   We strive to give you quality time with your provider. You may need to reschedule your appointment if you arrive late (15 or more minutes).  Arriving late affects you and other patients whose appointments are after yours.  Also, if you miss three or more appointments without notifying the office, you may be dismissed from the clinic at the provider's discretion.      For prescription refill requests, have your pharmacy contact our office and allow 72 hours for refills to be completed.    Today you received the following chemotherapy and/or immunotherapy agents: cemiplimab-rwlc (Libtayo)   To help prevent nausea and vomiting after your treatment, we encourage you to take your nausea medication as directed.  BELOW ARE SYMPTOMS THAT SHOULD BE REPORTED IMMEDIATELY: *FEVER GREATER THAN 100.4 F (38 C) OR HIGHER *CHILLS OR SWEATING *NAUSEA AND VOMITING THAT IS NOT CONTROLLED WITH YOUR NAUSEA MEDICATION *UNUSUAL SHORTNESS OF BREATH *UNUSUAL BRUISING OR BLEEDING *URINARY PROBLEMS (pain or burning when urinating, or frequent urination) *BOWEL PROBLEMS (unusual diarrhea, constipation, pain near the anus) TENDERNESS IN MOUTH AND THROAT WITH OR WITHOUT PRESENCE OF ULCERS (sore throat, sores in mouth, or a toothache) UNUSUAL RASH, SWELLING OR PAIN  UNUSUAL VAGINAL DISCHARGE OR ITCHING   Items with * indicate a potential emergency and should be followed up as soon as possible or go to the Emergency Department if any problems should occur.  Please show the CHEMOTHERAPY ALERT CARD or IMMUNOTHERAPY ALERT CARD at  check-in to the Emergency Department and triage nurse.  Should you have questions after your visit or need to cancel or reschedule your appointment, please contact [facility]  Dept: [phone]  and follow the prompts.  Office hours are 8:00 a.m. to 4:30 p.m. Monday - Friday. Please note that voicemails left after 4:00 p.m. may not be returned until the following business day.  We are closed weekends and major holidays. You have access to a nurse at all times for urgent questions. Please call the main number to the clinic Dept: [phone] and follow the prompts.   For any non-urgent questions, you may also contact your provider using MyChart. We now offer e-Visits for anyone 102 and older to request care online for non-urgent symptoms. For details visit [email or web address].   Also download the MyChart app! Go to the app store, search "MyChart", open the app, select [facility], and log in with your MyChart username and password.

## 2022-09-30 LAB — T4: T4, Total: 7.3 ug/dL (ref 4.5–12.0)

## 2022-10-07 DIAGNOSIS — I10 Essential (primary) hypertension: Secondary | ICD-10-CM | POA: Diagnosis not present

## 2022-10-07 DIAGNOSIS — E039 Hypothyroidism, unspecified: Secondary | ICD-10-CM | POA: Diagnosis not present

## 2022-10-12 ENCOUNTER — Telehealth: Payer: Self-pay | Admitting: Internal Medicine

## 2022-10-12 NOTE — Telephone Encounter (Signed)
Called patient regarding upcoming January-February appointments, left a voicemail.

## 2022-10-13 DIAGNOSIS — R809 Proteinuria, unspecified: Secondary | ICD-10-CM | POA: Diagnosis not present

## 2022-10-13 DIAGNOSIS — E039 Hypothyroidism, unspecified: Secondary | ICD-10-CM | POA: Diagnosis not present

## 2022-10-13 DIAGNOSIS — N400 Enlarged prostate without lower urinary tract symptoms: Secondary | ICD-10-CM | POA: Diagnosis not present

## 2022-10-13 DIAGNOSIS — M7989 Other specified soft tissue disorders: Secondary | ICD-10-CM | POA: Diagnosis not present

## 2022-10-13 DIAGNOSIS — M170 Bilateral primary osteoarthritis of knee: Secondary | ICD-10-CM | POA: Diagnosis not present

## 2022-10-13 DIAGNOSIS — Z0001 Encounter for general adult medical examination with abnormal findings: Secondary | ICD-10-CM | POA: Diagnosis not present

## 2022-10-13 DIAGNOSIS — I129 Hypertensive chronic kidney disease with stage 1 through stage 4 chronic kidney disease, or unspecified chronic kidney disease: Secondary | ICD-10-CM | POA: Diagnosis not present

## 2022-10-13 DIAGNOSIS — L80 Vitiligo: Secondary | ICD-10-CM | POA: Diagnosis not present

## 2022-10-13 DIAGNOSIS — C3490 Malignant neoplasm of unspecified part of unspecified bronchus or lung: Secondary | ICD-10-CM | POA: Diagnosis not present

## 2022-10-13 DIAGNOSIS — N184 Chronic kidney disease, stage 4 (severe): Secondary | ICD-10-CM | POA: Diagnosis not present

## 2022-10-19 ENCOUNTER — Inpatient Hospital Stay (HOSPITAL_BASED_OUTPATIENT_CLINIC_OR_DEPARTMENT_OTHER): Payer: Medicare HMO | Admitting: Internal Medicine

## 2022-10-19 ENCOUNTER — Encounter: Payer: Self-pay | Admitting: Internal Medicine

## 2022-10-19 ENCOUNTER — Other Ambulatory Visit: Payer: Self-pay

## 2022-10-19 ENCOUNTER — Inpatient Hospital Stay: Payer: Medicare HMO

## 2022-10-19 VITALS — BP 121/61 | HR 58 | Temp 97.7°F | Resp 18 | Wt 154.6 lb

## 2022-10-19 DIAGNOSIS — C3411 Malignant neoplasm of upper lobe, right bronchus or lung: Secondary | ICD-10-CM

## 2022-10-19 DIAGNOSIS — C3490 Malignant neoplasm of unspecified part of unspecified bronchus or lung: Secondary | ICD-10-CM | POA: Diagnosis not present

## 2022-10-19 DIAGNOSIS — C7989 Secondary malignant neoplasm of other specified sites: Secondary | ICD-10-CM | POA: Diagnosis not present

## 2022-10-19 DIAGNOSIS — R197 Diarrhea, unspecified: Secondary | ICD-10-CM | POA: Diagnosis not present

## 2022-10-19 DIAGNOSIS — Z5112 Encounter for antineoplastic immunotherapy: Secondary | ICD-10-CM | POA: Diagnosis not present

## 2022-10-19 DIAGNOSIS — N189 Chronic kidney disease, unspecified: Secondary | ICD-10-CM | POA: Diagnosis not present

## 2022-10-19 DIAGNOSIS — Z79899 Other long term (current) drug therapy: Secondary | ICD-10-CM | POA: Diagnosis not present

## 2022-10-19 LAB — CBC WITH DIFFERENTIAL (CANCER CENTER ONLY)
Abs Immature Granulocytes: 0.02 10*3/uL (ref 0.00–0.07)
Basophils Absolute: 0 10*3/uL (ref 0.0–0.1)
Basophils Relative: 0 %
Eosinophils Absolute: 0.2 10*3/uL (ref 0.0–0.5)
Eosinophils Relative: 5 %
HCT: 33.6 % — ABNORMAL LOW (ref 39.0–52.0)
Hemoglobin: 11.2 g/dL — ABNORMAL LOW (ref 13.0–17.0)
Immature Granulocytes: 1 %
Lymphocytes Relative: 24 %
Lymphs Abs: 0.8 10*3/uL (ref 0.7–4.0)
MCH: 30 pg (ref 26.0–34.0)
MCHC: 33.3 g/dL (ref 30.0–36.0)
MCV: 90.1 fL (ref 80.0–100.0)
Monocytes Absolute: 0.3 10*3/uL (ref 0.1–1.0)
Monocytes Relative: 10 %
Neutro Abs: 2.1 10*3/uL (ref 1.7–7.7)
Neutrophils Relative %: 60 %
Platelet Count: 195 10*3/uL (ref 150–400)
RBC: 3.73 MIL/uL — ABNORMAL LOW (ref 4.22–5.81)
RDW: 14.8 % (ref 11.5–15.5)
WBC Count: 3.4 10*3/uL — ABNORMAL LOW (ref 4.0–10.5)
nRBC: 0 % (ref 0.0–0.2)

## 2022-10-19 LAB — CMP (CANCER CENTER ONLY)
ALT: 7 U/L (ref 0–44)
AST: 9 U/L — ABNORMAL LOW (ref 15–41)
Albumin: 3 g/dL — ABNORMAL LOW (ref 3.5–5.0)
Alkaline Phosphatase: 62 U/L (ref 38–126)
Anion gap: 5 (ref 5–15)
BUN: 36 mg/dL — ABNORMAL HIGH (ref 8–23)
CO2: 28 mmol/L (ref 22–32)
Calcium: 8.8 mg/dL — ABNORMAL LOW (ref 8.9–10.3)
Chloride: 105 mmol/L (ref 98–111)
Creatinine: 2.95 mg/dL — ABNORMAL HIGH (ref 0.61–1.24)
GFR, Estimated: 20 mL/min — ABNORMAL LOW (ref >60)
Glucose, Bld: 85 mg/dL (ref 70–99)
Potassium: 4.1 mmol/L (ref 3.5–5.1)
Sodium: 138 mmol/L (ref 135–145)
Total Bilirubin: 0.4 mg/dL (ref 0.3–1.2)
Total Protein: 7.1 g/dL (ref 6.5–8.1)

## 2022-10-19 LAB — TSH: TSH: 1.911 u[IU]/mL (ref 0.350–4.500)

## 2022-10-19 MED ORDER — HEPARIN SOD (PORK) LOCK FLUSH 100 UNIT/ML IV SOLN: 500.0000 [IU] | Freq: Once | INTRAVENOUS | Status: DC | PRN

## 2022-10-19 MED ORDER — SODIUM CHLORIDE 0.9 % IV SOLN: Freq: Once | INTRAVENOUS | Status: AC

## 2022-10-19 MED ORDER — SODIUM CHLORIDE 0.9% FLUSH: 10.0000 mL | INTRAVENOUS | Status: DC | PRN

## 2022-10-19 MED ORDER — SODIUM CHLORIDE 0.9 % IV SOLN
350.0000 mg | Freq: Once | INTRAVENOUS | Status: AC
  Administered 2022-10-19: 350 mg via INTRAVENOUS
  Filled 2022-10-19: qty 7

## 2022-10-19 NOTE — Patient Instructions (Signed)
[facility]  Discharge Instructions: Thank you for choosing [facility] to provide your oncology and hematology care.   If you have a lab appointment with the Cancer Center, please go directly to the Cancer Center and check in at the registration area.   Wear comfortable clothing and clothing appropriate for easy access to any Portacath or PICC line.   We strive to give you quality time with your provider. You may need to reschedule your appointment if you arrive late (15 or more minutes).  Arriving late affects you and other patients whose appointments are after yours.  Also, if you miss three or more appointments without notifying the office, you may be dismissed from the clinic at the provider's discretion.      For prescription refill requests, have your pharmacy contact our office and allow 72 hours for refills to be completed.    Today you received the following chemotherapy and/or immunotherapy agents: Libtayo.       To help prevent nausea and vomiting after your treatment, we encourage you to take your nausea medication as directed.  BELOW ARE SYMPTOMS THAT SHOULD BE REPORTED IMMEDIATELY: *FEVER GREATER THAN 100.4 F (38 C) OR HIGHER *CHILLS OR SWEATING *NAUSEA AND VOMITING THAT IS NOT CONTROLLED WITH YOUR NAUSEA MEDICATION *UNUSUAL SHORTNESS OF BREATH *UNUSUAL BRUISING OR BLEEDING *URINARY PROBLEMS (pain or burning when urinating, or frequent urination) *BOWEL PROBLEMS (unusual diarrhea, constipation, pain near the anus) TENDERNESS IN MOUTH AND THROAT WITH OR WITHOUT PRESENCE OF ULCERS (sore throat, sores in mouth, or a toothache) UNUSUAL RASH, SWELLING OR PAIN  UNUSUAL VAGINAL DISCHARGE OR ITCHING   Items with * indicate a potential emergency and should be followed up as soon as possible or go to the Emergency Department if any problems should occur.  Please show the CHEMOTHERAPY ALERT CARD or IMMUNOTHERAPY ALERT CARD at  check-in to the Emergency Department and triage nurse.  Should you have questions after your visit or need to cancel or reschedule your appointment, please contact [facility]  Dept: [phone]  and follow the prompts.  Office hours are 8:00 a.m. to 4:30 p.m. Monday - Friday. Please note that voicemails left after 4:00 p.m. may not be returned until the following business day.  We are closed weekends and major holidays. You have access to a nurse at all times for urgent questions. Please call the main number to the clinic Dept: [phone] and follow the prompts.   For any non-urgent questions, you may also contact your provider using MyChart. We now offer e-Visits for anyone 62 and older to request care online for non-urgent symptoms. For details visit [email or web address].   Also download the MyChart app! Go to the app store, search "MyChart", open the app, select [place], and log in with your MyChart username and password.

## 2022-10-19 NOTE — Addendum Note (Signed)
Addended by: Charma Igo on: 10/19/2022 12:04 PM   Modules accepted: Orders

## 2022-10-19 NOTE — Progress Notes (Signed)
Per Dr. Arbutus Ped , it is okay to treat pt today with cemiplimab and creatinine of  2.95.

## 2022-10-19 NOTE — Progress Notes (Signed)
[facility] [phone]   [phone]  OFFICE PROGRESS NOTE  [name], MD [address] 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  Biomarker Findings Tumor Mutational Burden - 10 Muts/Mb Microsatellite status - MS-Stable Genomic Findings For a complete list of the genes assayed, please refer to the Appendix. KRAS G12C NFKBIA amplification NKX2-1 amplification RBM10 D663fs*80 7 Disease relevant genes with no reportable alterations: ALK, BRAF, EGFR, ERBB2, MET, RET, ROS1   PDL1: 90%   PRIOR THERAPY:  Weekly concurrent chemoradiation with carboplatin for an AUC of 2 and paclitaxel 45 mg/m.  First dose on 12/30/2020.  Status post 6 cycles.   CURRENT THERAPY: First-line treatment with immunotherapy with Libtayo (Cempilimab) 350 Mg IV every 3 weeks.  Status post 27 cycles.  INTERVAL HISTORY: [name] 89 y.o. male returns to the clinic today for follow-up visit accompanied by his son-in-law.  The patient is feeling fine today with no concerning complaints except for the immunotherapy induced vitiligo.  He denied having any current chest pain, shortness of breath, cough or hemoptysis.  He has no nausea, vomiting, diarrhea or constipation.  He has no headache or visual changes.  He is here today for evaluation before starting cycle #28.  MEDICAL HISTORY: Past Medical History:  Diagnosis Date   Arthritis    Hypertension    Hyperthyroidism    lung ca 11/2020    ALLERGIES:  has No Known Allergies.  MEDICATIONS:  Current Outpatient Medications  Medication Sig Dispense Refill   amLODipine (NORVASC) 10 MG tablet Take 10 mg by mouth daily.      gabapentin (NEURONTIN) 250 MG/5ML solution Take 3 mLs (150 mg total) by mouth at bedtime. 90 mL 2   hydrochlorothiazide (MICROZIDE) 12.5 MG capsule Take 12.5 mg by mouth daily.     labetalol (NORMODYNE) 200 MG tablet Take 200 mg by mouth 2 (two) times daily.     levothyroxine (SYNTHROID) 150 MCG tablet Take 150 mcg by mouth daily.     levothyroxine (SYNTHROID) 175 MCG tablet Take 1 tablet (175 mcg total) by mouth daily before breakfast. 30 tablet 2   loperamide (IMODIUM) 2 MG capsule Take by mouth as needed for diarrhea or loose stools.     naproxen (NAPROSYN) 500 MG tablet Take 500 mg by mouth 2 (two) times daily as needed for moderate pain.     oxyCODONE-acetaminophen (PERCOCET/ROXICET) 5-325 MG tablet Take 1 tablet by mouth every 8 (eight) hours as needed for severe pain. 30 tablet 0   prochlorperazine (COMPAZINE) 10 MG tablet Take 1 tablet (10 mg total) by mouth every 6 (six) hours as needed for nausea or vomiting. 30 tablet 2   tadalafil (CIALIS) 5 MG tablet Take 5 mg by mouth daily.     tamsulosin (FLOMAX) 0.4 MG CAPS capsule Take 0.4 mg by mouth daily.     No current facility-administered medications for this visit.    SURGICAL HISTORY:  Past Surgical History:  Procedure Laterality Date   COLONOSCOPY N/A 09/02/2016   Procedure: COLONOSCOPY;  Surgeon: [name], MD;  Location: [facility];  Service: Endoscopy;  Laterality: N/A;  830   NO PAST SURGERIES     POLYPECTOMY  09/02/2016  Procedure: POLYPECTOMY;  Surgeon: [name], MD;  Location: [facility];  Service: Endoscopy;;    REVIEW OF SYSTEMS:  A comprehensive review of systems was negative.   PHYSICAL EXAMINATION: General appearance: alert, cooperative, and no distress Head: Normocephalic, without obvious abnormality, atraumatic Neck: no adenopathy, no JVD, supple, symmetrical, trachea midline, and thyroid not enlarged, symmetric, no tenderness/mass/nodules Lymph nodes: Cervical, supraclavicular, and axillary  nodes normal. Resp: clear to auscultation bilaterally Back: symmetric, no curvature. ROM normal. No CVA tenderness. Cardio: regular rate and rhythm, S1, S2 normal, no murmur, click, rub or gallop GI: soft, non-tender; bowel sounds normal; no masses,  no organomegaly Extremities: extremities normal, atraumatic, no cyanosis or edema        ECOG PERFORMANCE STATUS: 1 - Symptomatic but completely ambulatory  Blood pressure 121/61, pulse (!) 58, temperature 97.7 F (36.5 C), temperature source Oral, resp. rate 18, weight 154 lb 9 oz (70.1 kg), SpO2 94 %.  LABORATORY DATA: Lab Results  Component Value Date   WBC 3.4 (L) 10/19/2022   HGB 11.2 (L) 10/19/2022   HCT 33.6 (L) 10/19/2022   MCV 90.1 10/19/2022   PLT 195 10/19/2022      Chemistry      Component Value Date/Time   NA 137 09/29/2022 1036   K 4.1 09/29/2022 1036   CL 106 09/29/2022 1036   CO2 25 09/29/2022 1036   BUN 36 (H) 09/29/2022 1036   CREATININE 2.75 (H) 09/29/2022 1036      Component Value Date/Time   CALCIUM 8.7 (L) 09/29/2022 1036   ALKPHOS 58 09/29/2022 1036   AST 15 09/29/2022 1036   ALT 19 09/29/2022 1036   BILITOT 0.5 09/29/2022 1036       RADIOGRAPHIC STUDIES: No results found.  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  The patient underwent a course of concurrent chemoradiation with weekly carboplatin for AUC of 2 and paclitaxel 45 Mg/M2 status post 6 cycles. He tolerated the previous course of his concurrent chemoradiation fairly well except for fatigue. Unfortunately his CT scan of the chest after the induction phase  showed interval enlargement of a large mass centered at the periphery of the right upper lobe and extending into the adjacent chest wall measuring 8.4 x 6.7 cm with bony destruction of the right third and fourth ribs substantially increased compared to the prior examination and the findings are consistent with worsened malignancy. The patient has PD-L1 expression of 90% and I felt he will be a good candidate for treatment with first-line single agent immunotherapy. I recommended for him treatment with Libtayo (Cempilimab) 350 Mg IV every 3 weeks.  He is status post 27 cycles of treatment. He has been tolerating this treatment well with no concerning adverse effect except for the immunotherapy mediated vitiligo. I recommended for the patient to proceed with cycle #28 today as planned. I will see him back for follow-up visit in 3 weeks for evaluation with repeat CT scan of the chest, abdomen and pelvis for restaging of his disease. For the hypothyroidism, the patient will continue his current treatment with levothyroxine. The patient was advised to call immediately if he has any other concerning  symptoms in the interval. The patient voices understanding of current disease status and treatment options and is in agreement with the current care plan.  All questions were answered. The patient knows to call the clinic with any problems, questions or concerns. We can certainly see the patient much sooner if necessary.   Disclaimer: This note was dictated with voice recognition software. Similar sounding words can inadvertently be transcribed and may not be corrected upon review.

## 2022-10-20 LAB — T4: T4, Total: 7.9 ug/dL (ref 4.5–12.0)

## 2022-11-02 DIAGNOSIS — I129 Hypertensive chronic kidney disease with stage 1 through stage 4 chronic kidney disease, or unspecified chronic kidney disease: Secondary | ICD-10-CM | POA: Diagnosis not present

## 2022-11-02 DIAGNOSIS — D631 Anemia in chronic kidney disease: Secondary | ICD-10-CM | POA: Diagnosis not present

## 2022-11-02 DIAGNOSIS — N184 Chronic kidney disease, stage 4 (severe): Secondary | ICD-10-CM | POA: Diagnosis not present

## 2022-11-02 DIAGNOSIS — C3411 Malignant neoplasm of upper lobe, right bronchus or lung: Secondary | ICD-10-CM | POA: Diagnosis not present

## 2022-11-05 ENCOUNTER — Ambulatory Visit (HOSPITAL_COMMUNITY)
Admission: RE | Admit: 2022-11-05 | Discharge: 2022-11-05 | Disposition: A | Discharge status: Home / Self Care | Payer: Medicare HMO | Source: Ambulatory Visit | Attending: Internal Medicine | Admitting: Internal Medicine

## 2022-11-05 DIAGNOSIS — K5730 Diverticulosis of large intestine without perforation or abscess without bleeding: Secondary | ICD-10-CM | POA: Diagnosis not present

## 2022-11-05 DIAGNOSIS — C3490 Malignant neoplasm of unspecified part of unspecified bronchus or lung: Secondary | ICD-10-CM | POA: Diagnosis not present

## 2022-11-05 DIAGNOSIS — N281 Cyst of kidney, acquired: Secondary | ICD-10-CM | POA: Diagnosis not present

## 2022-11-05 DIAGNOSIS — J439 Emphysema, unspecified: Secondary | ICD-10-CM | POA: Diagnosis not present

## 2022-11-05 NOTE — Progress Notes (Signed)
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   Biomarker Findings Tumor Mutational Burden - 10 Muts/Mb Microsatellite status - MS-Stable Genomic Findings For a complete list of the genes assayed, please refer to the Appendix. KRAS G12C NFKBIA amplification NKX2-1 amplification RBM10 D646fs*80 7 Disease relevant genes with no reportable alterations: ALK, BRAF, EGFR, ERBB2, MET, RET, ROS1   PDL1: 90%  PRIOR THERAPY: Weekly concurrent chemoradiation with carboplatin for an AUC of 2 and paclitaxel 45 mg/m.  First dose on 12/30/2020.  Status post 6 cycles     CURRENT THERAPY: First-line treatment with immunotherapy with Libtayo (Cempilimab) 350 Mg IV every 3 weeks. First dose on 03/19/21. Status post 28 cycles.     INTERVAL HISTORY: [name] 89 y.o. male returns to the clinic today for a follow-up visit accompanied by his son-in-law and granddaughter.  The patient is feeling fairly well today without any concern complaints. He is tolerating his treatment well except he developed immunotherapy mediated vitiligo. He reports stable fatigue. He reports dyspnea on exertion which is stable.  He denies significant cough. Denies any chest pain or hemoptysis. Denies any headache or visual changes. He has CKD and follows with his nephrologist. He saw his nephrologist last week. Denies any recent medication changes. He recently had a restaging CT scan performed. He is here today for evaluation and to review his scan before undergoing cycle #29.    MEDICAL HISTORY: Past Medical History:  Diagnosis Date   Arthritis    Hypertension     Hyperthyroidism    lung ca 11/2020    ALLERGIES:  has No Known Allergies.  MEDICATIONS:  Current Outpatient Medications  Medication Sig Dispense Refill   amLODipine (NORVASC) 10 MG tablet Take 10 mg by mouth daily.     diclofenac Sodium (VOLTAREN) 1 % GEL APPLY 2 GRAMS TO THE AFFECTED AREA(S) BY TOPICAL ROUTE 2 TIMES PER DAY     gabapentin (NEURONTIN) 250 MG/5ML solution Take 3 mLs (150 mg total) by mouth at bedtime. 90 mL 2   hydrochlorothiazide (MICROZIDE) 12.5 MG capsule Take 12.5 mg by mouth daily.     labetalol (NORMODYNE) 200 MG tablet Take 200 mg by mouth 2 (two) times daily.     levothyroxine (SYNTHROID) 150 MCG tablet Take 150 mcg by mouth daily.     loperamide (IMODIUM) 2 MG capsule Take by mouth as needed for diarrhea or loose stools.     naproxen (NAPROSYN) 500 MG tablet Take 500 mg by mouth 2 (two) times daily as needed for moderate pain.     oxyCODONE-acetaminophen (PERCOCET/ROXICET) 5-325 MG tablet Take 1 tablet by mouth every 8 (eight) hours as needed for severe pain. 30 tablet 0   prochlorperazine (COMPAZINE) 10 MG tablet Take 1 tablet (10 mg total) by mouth every 6 (six) hours as needed for nausea or vomiting. 30 tablet 2   tadalafil (CIALIS) 5 MG tablet Take 5 mg by mouth daily.     tamsulosin (FLOMAX) 0.4 MG CAPS capsule Take 0.4 mg by mouth daily.     No current facility-administered medications for this visit.    SURGICAL HISTORY:  Past Surgical History:  Procedure Laterality Date  COLONOSCOPY N/A 09/02/2016   Procedure: COLONOSCOPY;  Surgeon: Rogene Houston, MD;  Location: [facility];  Service: Endoscopy;  Laterality: N/A;  830   NO PAST SURGERIES     POLYPECTOMY  09/02/2016   Procedure: POLYPECTOMY;  Surgeon: Rogene Houston, MD;  Location: [facility];  Service: Endoscopy;;    REVIEW OF SYSTEMS:   Constitutional: Positive for stable fatigue. Negative for appetite change, chills, fever and unexpected weight change.  HENT: Negative for mouth sores,  nosebleeds, sore throat and trouble swallowing.  Eyes: Negative for eye problems and icterus.  Respiratory: Positive for stable shortness of breath with exertion. Negative for hemoptysis, cough and wheezing.  Cardiovascular: Negative for chest pain and leg swelling.  Gastrointestinal: Negative for abdominal pain, constipation, diarrhea, nausea and vomiting.  Genitourinary: Negative for bladder incontinence, difficulty urinating, dysuria, frequency and hematuria.   Musculoskeletal: Negative for back pain, gait problem, neck pain and neck stiffness.  Skin: Positive for hypopigmentation on face/vitiligo .  Neurological: Negative for dizziness, extremity weakness, gait problem, headaches, light-headedness and seizures.  Hematological: Negative for adenopathy. Does not bruise/bleed easily.  Psychiatric/Behavioral: Negative for confusion, depression and sleep disturbance. The patient is not nervous/anxious.   PHYSICAL EXAMINATION:  Blood pressure (!) 141/77, pulse 75, temperature 97.9 F (36.6 C), temperature source Temporal, resp. rate 18, height 6\' 2"  (1.88 m), weight 153 lb (69.4 kg), SpO2 96 %.  ECOG PERFORMANCE STATUS: 1-2  Physical Exam  Constitutional: Oriented to person, place, and time and thin appearing male and in no distress.  HENT:  Head: Normocephalic and atraumatic.  Mouth/Throat: Oropharynx is clear and moist. No oropharyngeal exudate.  No evidence of thrush Eyes: Conjunctivae are normal. Right eye exhibits no discharge. Left eye exhibits no discharge. No scleral icterus.  Neck: Normal range of motion. Neck supple.  Cardiovascular: Normal rate, regular rhythm, normal heart sounds and intact distal pulses.   Pulmonary/Chest: Effort normal and breath sounds normal. No respiratory distress. No wheezes. No rales.  Abdominal: Soft. Bowel sounds are normal. Exhibits no distension and no mass. There is no tenderness.  Musculoskeletal: Normal range of motion. Exhibits no edema.   Lymphadenopathy:    No cervical adenopathy.  Neurological: Alert and oriented to person, place, and time. Exhibits muscle wasting.  The patient was examined in the wheelchair. Skin: Hypopigmentation on his face and upper torso and hands. Skin is warm and dry. Not diaphoretic. No erythema. No pallor.  Psychiatric: Mood, memory and judgment normal.  Vitals reviewed.  LABORATORY DATA: Lab Results  Component Value Date   WBC 3.6 (L) 11/09/2022   HGB 11.0 (L) 11/09/2022   HCT 33.1 (L) 11/09/2022   MCV 90.4 11/09/2022   PLT 191 11/09/2022      Chemistry      Component Value Date/Time   NA 138 11/09/2022 1103   K 4.3 11/09/2022 1103   CL 106 11/09/2022 1103   CO2 27 11/09/2022 1103   BUN 33 (H) 11/09/2022 1103   CREATININE 2.65 (H) 11/09/2022 1103      Component Value Date/Time   CALCIUM 8.9 11/09/2022 1103   ALKPHOS 75 11/09/2022 1103   AST 11 (L) 11/09/2022 1103   ALT 8 11/09/2022 1103   BILITOT 0.5 11/09/2022 1103       RADIOGRAPHIC STUDIES:  CT Chest Wo Contrast  Result Date: 11/05/2022 CLINICAL DATA:  Non-small-cell lung cancer. Restaging. * Tracking Code: BO * EXAM: CT CHEST, ABDOMEN AND PELVIS WITHOUT CONTRAST TECHNIQUE: Multidetector CT imaging of the chest,  abdomen and pelvis was performed following the standard protocol without IV contrast. RADIATION DOSE REDUCTION: This exam was performed according to the departmental dose-optimization program which includes automated exposure control, adjustment of the mA and/or kV according to patient size and/or use of iterative reconstruction technique. COMPARISON:  08/15/2022 FINDINGS: CT CHEST FINDINGS Cardiovascular: The heart size is normal. No substantial pericardial effusion. Mild atherosclerotic calcification is noted in the wall of the thoracic aorta. Mediastinum/Nodes: Scattered small mediastinal lymph nodes are stable. No evidence for gross hilar lymphadenopathy although assessment is limited by the lack of intravenous  contrast on the current study. The esophagus has normal imaging features. There is no axillary lymphadenopathy. Lungs/Pleura: Centrilobular and paraseptal emphysema evident. Bullous changes noted in both lung apices. Pleuroparenchymal scarring in the peripheral aspect of the posterior right upper lobe is associated with right upper lobe volume loss, features stable since prior study. Right parahilar fibrosis is compatible with treatment related change in stable in the interval. No new suspicious pulmonary nodule or mass. No focal airspace consolidation. No pleural effusion. Musculoskeletal: Stable appearance of destructive changes in the lateral right third rib with chronic appearance. Bilateral gynecomastia noted. CT ABDOMEN PELVIS FINDINGS Hepatobiliary: No suspicious focal abnormality in the liver on this study without intravenous contrast. There is no evidence for gallstones, gallbladder wall thickening, or pericholecystic fluid. No intrahepatic or extrahepatic biliary dilation. Pancreas: No focal mass lesion. No dilatation of the main duct. No intraparenchymal cyst. No peripancreatic edema. Spleen: Ill-defined hypoattenuating lesion in the inferior spleen is stable in the interval. Adrenals/Urinary Tract: No adrenal nodule or mass. Stable small cyst upper pole right kidney with additional small hypodensities towards the lower pole, too small to characterize. These are most likely benign cyst. No followup imaging is recommended. Similar appearance of probable cysts in the left kidney. No followup imaging is recommended. No evidence for hydroureter. Circumferential bladder wall thickening evident, accentuated by underdistention. Stomach/Bowel: Stomach is unremarkable. No gastric wall thickening. No evidence of outlet obstruction. Duodenum is normally positioned as is the ligament of Treitz. No small bowel wall thickening. No small bowel dilatation. The terminal ileum is normal. The appendix is not well  visualized, but there is no edema or inflammation in the region of the cecum. No gross colonic mass. No colonic wall thickening. Diverticular changes are noted in the left colon without evidence of diverticulitis. Vascular/Lymphatic: There is moderate atherosclerotic calcification of the abdominal aorta without aneurysm. There is no gastrohepatic or hepatoduodenal ligament lymphadenopathy. No retroperitoneal or mesenteric lymphadenopathy. No pelvic sidewall lymphadenopathy. Reproductive: Prostate gland is enlarged with probable TURP defect. Fiducial markers evident. Other: Trace free fluid is seen in the pelvis, nonspecific. Musculoskeletal: No worrisome lytic or sclerotic osseous abnormality. IMPRESSION: 1. Stable exam. No new or progressive findings to suggest recurrent or metastatic disease in the chest, abdomen, or pelvis. 2. Post treatment scarring in the right upper lobe and parahilar right lung is stable in the interval. 3. Prostatomegaly with probable TURP defect. Circumferential bladder wall thickening noted today and further accentuated by underdistention. Imaging features may be related to chronic bladder outlet obstruction. 4. Trace free fluid in the pelvis, nonspecific. Attention on follow-up recommended. 5. Left colonic diverticulosis without diverticulitis. 6. Aortic Atherosclerosis (ICD10-I70.0) and Emphysema (ICD10-J43.9). Electronically Signed   By: Misty Stanley M.D.   On: 11/05/2022 12:41   CT Abdomen Pelvis Wo Contrast  Result Date: 11/05/2022 CLINICAL DATA:  Non-small-cell lung cancer. Restaging. * Tracking Code: BO * EXAM: CT CHEST, ABDOMEN AND PELVIS WITHOUT CONTRAST  TECHNIQUE: Multidetector CT imaging of the chest, abdomen and pelvis was performed following the standard protocol without IV contrast. RADIATION DOSE REDUCTION: This exam was performed according to the departmental dose-optimization program which includes automated exposure control, adjustment of the mA and/or kV according to  patient size and/or use of iterative reconstruction technique. COMPARISON:  08/15/2022 FINDINGS: CT CHEST FINDINGS Cardiovascular: The heart size is normal. No substantial pericardial effusion. Mild atherosclerotic calcification is noted in the wall of the thoracic aorta. Mediastinum/Nodes: Scattered small mediastinal lymph nodes are stable. No evidence for gross hilar lymphadenopathy although assessment is limited by the lack of intravenous contrast on the current study. The esophagus has normal imaging features. There is no axillary lymphadenopathy. Lungs/Pleura: Centrilobular and paraseptal emphysema evident. Bullous changes noted in both lung apices. Pleuroparenchymal scarring in the peripheral aspect of the posterior right upper lobe is associated with right upper lobe volume loss, features stable since prior study. Right parahilar fibrosis is compatible with treatment related change in stable in the interval. No new suspicious pulmonary nodule or mass. No focal airspace consolidation. No pleural effusion. Musculoskeletal: Stable appearance of destructive changes in the lateral right third rib with chronic appearance. Bilateral gynecomastia noted. CT ABDOMEN PELVIS FINDINGS Hepatobiliary: No suspicious focal abnormality in the liver on this study without intravenous contrast. There is no evidence for gallstones, gallbladder wall thickening, or pericholecystic fluid. No intrahepatic or extrahepatic biliary dilation. Pancreas: No focal mass lesion. No dilatation of the main duct. No intraparenchymal cyst. No peripancreatic edema. Spleen: Ill-defined hypoattenuating lesion in the inferior spleen is stable in the interval. Adrenals/Urinary Tract: No adrenal nodule or mass. Stable small cyst upper pole right kidney with additional small hypodensities towards the lower pole, too small to characterize. These are most likely benign cyst. No followup imaging is recommended. Similar appearance of probable cysts in the  left kidney. No followup imaging is recommended. No evidence for hydroureter. Circumferential bladder wall thickening evident, accentuated by underdistention. Stomach/Bowel: Stomach is unremarkable. No gastric wall thickening. No evidence of outlet obstruction. Duodenum is normally positioned as is the ligament of Treitz. No small bowel wall thickening. No small bowel dilatation. The terminal ileum is normal. The appendix is not well visualized, but there is no edema or inflammation in the region of the cecum. No gross colonic mass. No colonic wall thickening. Diverticular changes are noted in the left colon without evidence of diverticulitis. Vascular/Lymphatic: There is moderate atherosclerotic calcification of the abdominal aorta without aneurysm. There is no gastrohepatic or hepatoduodenal ligament lymphadenopathy. No retroperitoneal or mesenteric lymphadenopathy. No pelvic sidewall lymphadenopathy. Reproductive: Prostate gland is enlarged with probable TURP defect. Fiducial markers evident. Other: Trace free fluid is seen in the pelvis, nonspecific. Musculoskeletal: No worrisome lytic or sclerotic osseous abnormality. IMPRESSION: 1. Stable exam. No new or progressive findings to suggest recurrent or metastatic disease in the chest, abdomen, or pelvis. 2. Post treatment scarring in the right upper lobe and parahilar right lung is stable in the interval. 3. Prostatomegaly with probable TURP defect. Circumferential bladder wall thickening noted today and further accentuated by underdistention. Imaging features may be related to chronic bladder outlet obstruction. 4. Trace free fluid in the pelvis, nonspecific. Attention on follow-up recommended. 5. Left colonic diverticulosis without diverticulitis. 6. Aortic Atherosclerosis (ICD10-I70.0) and Emphysema (ICD10-J43.9). Electronically Signed   By: Misty Stanley M.D.   On: 11/05/2022 12:41     ASSESSMENT/PLAN:  This is a very pleasant 89 year old African-American  male diagnosed with a stage IVa (T4, N3,  M1 B) non-small cell lung cancer, adenosquamous carcinoma in March 2022 and presented with large mass involving the lateral right upper lobe and right chest wall soft tissue in addition to right hilar, mediastinal and right subpectoral and supraclavicular lymphadenopathy with PD-L1 expression of 90%.   The patient is status post a course of concurrent chemoradiation with carboplatin for an AUC of 2 and paclitaxel 45 mg per metered square.  He is status post 6 cycles.    The patient had evidence of disease progression following concurrent chemoradiation.  The patient is currently undergoing single agent immunotherapy with Libtayo IV every 3 weeks due to his PD-L1 expression of 90%.  The patient is status post 24 cycles.  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  He was seen by Dr. Denna Haggard by dermatology.  He recently had a restaging CT scan performed. Dr. Julien Nordmann personally and independently reviewed the scan and discussed the results with the patient. The scan showed no evidence of disease progression.    The patient was seen with Dr. Julien Nordmann today.  Labs were reviewed. The patient has baseline CKD. His creatinine is 2.65. Recommend that he proceed with cycle #29 today scheduled.    We will see him back for follow-up visit in 3 weeks for evaluation and repeat blood work before undergoing cycle #28.  We have previously had a discussion about his vitiligo and his immunotherapy and the decision to continue. Given his age and CKD, alternative treatments would likely be challenging for this patient. Therefore, he opted to continue on his current treatment. Of note, he does have KRAS G12C mutation which can be used in the second line setting in the future   The patient was advised to call immediately if she has any concerning symptoms in the interval. The patient voices understanding of current disease  status and treatment options and is in agreement with the current care plan. All questions were answered. The patient knows to call the clinic with any problems, questions or concerns. We can certainly see the patient much sooner if necessary    No orders of the defined types were placed in this encounter.   Veronda Gabor L Adamarie Izzo, PA-C 11/09/22  ADDENDUM: Hematology/Oncology Attending: I had a face-to-face encounter with the patient today.  I reviewed his records, lab, scan and recommended his care plan.  This is a very pleasant 89 years old African-American male with a stage IV non-small cell lung cancer, adenosquamous carcinoma diagnosed in March 2022 with positive KRAS G12C mutation and PD-L1 expression of 90%.  The patient initially underwent a course of concurrent chemoradiation to the locally advanced disease in the chest.  He was then started treatment with single agent immunotherapy with Libtayo (Cempilimab) 350 Mg IV every 3 weeks status post 28 cycles.  He has been tolerating his treatment fairly well with no concerning adverse effect except for the immunotherapy induced vitiligo. The patient had repeat CT scan of the chest, abdomen and pelvis performed recently.  I personally and independently reviewed the scan and discussed the result with the patient and his son-in-law and granddaughter. His scan showed no concerning findings for disease progression. I recommended for him to continue his current treatment with immunotherapy until will complete 2 years of treatment unless the patient has any concerning and unacceptable adverse effects or disease progression. He will proceed with cycle #29 today. He will come back for follow-up visit in 3 weeks for evaluation before starting cycle #  3. The patient was advised to call immediately if he has any other concerning symptoms in the interval. The total time spent in the appointment was 30 minutes. Disclaimer: This note was dictated with  voice recognition software. Similar sounding words can inadvertently be transcribed and may be missed upon review. Eilleen Kempf, MD

## 2022-11-09 ENCOUNTER — Inpatient Hospital Stay: Payer: Medicare HMO | Attending: Physician Assistant | Admitting: Physician Assistant

## 2022-11-09 ENCOUNTER — Other Ambulatory Visit: Payer: Self-pay

## 2022-11-09 ENCOUNTER — Inpatient Hospital Stay: Payer: Medicare HMO

## 2022-11-09 VITALS — BP 141/77 | HR 75 | Temp 97.9°F | Resp 18 | Ht 74.0 in | Wt 153.0 lb

## 2022-11-09 DIAGNOSIS — C3411 Malignant neoplasm of upper lobe, right bronchus or lung: Secondary | ICD-10-CM | POA: Diagnosis not present

## 2022-11-09 DIAGNOSIS — Z5112 Encounter for antineoplastic immunotherapy: Secondary | ICD-10-CM | POA: Diagnosis not present

## 2022-11-09 DIAGNOSIS — Z79899 Other long term (current) drug therapy: Secondary | ICD-10-CM | POA: Insufficient documentation

## 2022-11-09 DIAGNOSIS — C7989 Secondary malignant neoplasm of other specified sites: Secondary | ICD-10-CM | POA: Diagnosis not present

## 2022-11-09 LAB — CBC WITH DIFFERENTIAL (CANCER CENTER ONLY)
Abs Immature Granulocytes: 0.01 10*3/uL (ref 0.00–0.07)
Basophils Absolute: 0 10*3/uL (ref 0.0–0.1)
Basophils Relative: 0 %
Eosinophils Absolute: 0.2 10*3/uL (ref 0.0–0.5)
Eosinophils Relative: 5 %
HCT: 33.1 % — ABNORMAL LOW (ref 39.0–52.0)
Hemoglobin: 11 g/dL — ABNORMAL LOW (ref 13.0–17.0)
Immature Granulocytes: 0 %
Lymphocytes Relative: 27 %
Lymphs Abs: 1 10*3/uL (ref 0.7–4.0)
MCH: 30.1 pg (ref 26.0–34.0)
MCHC: 33.2 g/dL (ref 30.0–36.0)
MCV: 90.4 fL (ref 80.0–100.0)
Monocytes Absolute: 0.4 10*3/uL (ref 0.1–1.0)
Monocytes Relative: 10 %
Neutro Abs: 2.1 10*3/uL (ref 1.7–7.7)
Neutrophils Relative %: 58 %
Platelet Count: 191 10*3/uL (ref 150–400)
RBC: 3.66 MIL/uL — ABNORMAL LOW (ref 4.22–5.81)
RDW: 15.6 % — ABNORMAL HIGH (ref 11.5–15.5)
WBC Count: 3.6 10*3/uL — ABNORMAL LOW (ref 4.0–10.5)
nRBC: 0 % (ref 0.0–0.2)

## 2022-11-09 LAB — CMP (CANCER CENTER ONLY)
ALT: 8 U/L (ref 0–44)
AST: 11 U/L — ABNORMAL LOW (ref 15–41)
Albumin: 3.2 g/dL — ABNORMAL LOW (ref 3.5–5.0)
Alkaline Phosphatase: 75 U/L (ref 38–126)
Anion gap: 5 (ref 5–15)
BUN: 33 mg/dL — ABNORMAL HIGH (ref 8–23)
CO2: 27 mmol/L (ref 22–32)
Calcium: 8.9 mg/dL (ref 8.9–10.3)
Chloride: 106 mmol/L (ref 98–111)
Creatinine: 2.65 mg/dL — ABNORMAL HIGH (ref 0.61–1.24)
GFR, Estimated: 23 mL/min — ABNORMAL LOW (ref >60)
Glucose, Bld: 93 mg/dL (ref 70–99)
Potassium: 4.3 mmol/L (ref 3.5–5.1)
Sodium: 138 mmol/L (ref 135–145)
Total Bilirubin: 0.5 mg/dL (ref 0.3–1.2)
Total Protein: 7.5 g/dL (ref 6.5–8.1)

## 2022-11-09 LAB — TSH: TSH: 3.365 u[IU]/mL (ref 0.350–4.500)

## 2022-11-09 MED ORDER — SODIUM CHLORIDE 0.9 % IV SOLN
350.0000 mg | Freq: Once | INTRAVENOUS | Status: AC
  Administered 2022-11-09: 350 mg via INTRAVENOUS
  Filled 2022-11-09: qty 7

## 2022-11-09 MED ORDER — SODIUM CHLORIDE 0.9 % IV SOLN: Freq: Once | INTRAVENOUS | Status: AC

## 2022-11-09 NOTE — Patient Instructions (Signed)
[facility]  Discharge Instructions: Thank you for choosing [facility] to provide your oncology and hematology care.   If you have a lab appointment with the [facility], please go directly to the [facility] and check in at the registration area.   Wear comfortable clothing and clothing appropriate for easy access to any Portacath or PICC line.   We strive to give you quality time with your provider. You may need to reschedule your appointment if you arrive late (15 or more minutes).  Arriving late affects you and other patients whose appointments are after yours.  Also, if you miss three or more appointments without notifying the office, you may be dismissed from the clinic at the provider's discretion.      For prescription refill requests, have your pharmacy contact our office and allow 72 hours for refills to be completed.    Today you received the following chemotherapy and/or immunotherapy agents opdivo      To help prevent nausea and vomiting after your treatment, we encourage you to take your nausea medication as directed.  BELOW ARE SYMPTOMS THAT SHOULD BE REPORTED IMMEDIATELY: *FEVER GREATER THAN 100.4 F (38 C) OR HIGHER *CHILLS OR SWEATING *NAUSEA AND VOMITING THAT IS NOT CONTROLLED WITH YOUR NAUSEA MEDICATION *UNUSUAL SHORTNESS OF BREATH *UNUSUAL BRUISING OR BLEEDING *URINARY PROBLEMS (pain or burning when urinating, or frequent urination) *BOWEL PROBLEMS (unusual diarrhea, constipation, pain near the anus) TENDERNESS IN MOUTH AND THROAT WITH OR WITHOUT PRESENCE OF ULCERS (sore throat, sores in mouth, or a toothache) UNUSUAL RASH, SWELLING OR PAIN  UNUSUAL VAGINAL DISCHARGE OR ITCHING   Items with * indicate a potential emergency and should be followed up as soon as possible or go to the Emergency Department if any problems should occur.  Please show the CHEMOTHERAPY ALERT CARD or IMMUNOTHERAPY ALERT CARD at check-in  to the Emergency Department and triage nurse.  Should you have questions after your visit or need to cancel or reschedule your appointment, please contact [facility]  Dept: [phone]  and follow the prompts.  Office hours are 8:00 a.m. to 4:30 p.m. Monday - Friday. Please note that voicemails left after 4:00 p.m. may not be returned until the following business day.  We are closed weekends and major holidays. You have access to a nurse at all times for urgent questions. Please call the main number to the clinic Dept: [phone] and follow the prompts.   For any non-urgent questions, you may also contact your provider using MyChart. We now offer e-Visits for anyone 61 and older to request care online for non-urgent symptoms. For details visit [email or web address].   Also download the MyChart app! Go to the app store, search "MyChart", open the app, select [facility], and log in with your MyChart username and password.

## 2022-11-10 ENCOUNTER — Telehealth: Payer: Self-pay | Admitting: Internal Medicine

## 2022-11-10 ENCOUNTER — Other Ambulatory Visit: Payer: Self-pay

## 2022-11-10 NOTE — Telephone Encounter (Signed)
Scheduled per 02/12 work-queue, patient has been called and voicemail was left.

## 2022-11-11 LAB — T4: T4, Total: 9.1 ug/dL (ref 4.5–12.0)

## 2022-11-15 ENCOUNTER — Other Ambulatory Visit: Payer: Self-pay

## 2022-11-26 NOTE — Progress Notes (Signed)
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   Biomarker Findings Tumor Mutational Burden - 10 Muts/Mb Microsatellite status - MS-Stable Genomic Findings For a complete list of the genes assayed, please refer to the Appendix. KRAS G12C NFKBIA amplification NKX2-1 amplification RBM10 [id number] 7 Disease relevant genes with no reportable alterations: ALK, BRAF, EGFR, ERBB2, MET, RET, ROS1   PDL1: 90%  PRIOR THERAPY: Weekly concurrent chemoradiation with carboplatin for an AUC of 2 and paclitaxel 45 mg/m.  First dose on 12/30/2020.  Status post 6 cycles     CURRENT THERAPY: First-line treatment with immunotherapy with Libtayo (Cempilimab) 350 Mg IV every 3 weeks. First dose on 03/19/21. Status post 29 cycles.     INTERVAL HISTORY: Brian [id number]. male returns to the clinic today for a follow-up visit accompanied by his son-in-law. The patient is feeling fairly well today without any concern complaints. He is tolerating his treatment well except he developed immunotherapy mediated vitiligo. He reports stable fatigue. He reports dyspnea on exertion which is stable.  He denies significant cough. Denies any chest pain or hemoptysis. Denies any headache or visual changes. He has CKD and follows with his nephrologist.  He denies nausea, vomiting, diarrhea, or constipation. He is here for evaluation and repeat blood work before starting cycle #30.   MEDICAL HISTORY: Past Medical History:  Diagnosis Date   Arthritis    Hypertension    Hyperthyroidism    lung ca 11/2020    ALLERGIES:  has No Known Allergies.  MEDICATIONS:   Current Outpatient Medications  Medication Sig Dispense Refill   amLODipine (NORVASC) 10 MG tablet Take 10 mg by mouth daily.     diclofenac Sodium (VOLTAREN) 1 % GEL APPLY 2 GRAMS TO THE AFFECTED AREA(S) BY TOPICAL ROUTE 2 TIMES PER DAY     gabapentin (NEURONTIN) 250 MG/5ML solution Take 3 mLs (150 mg total) by mouth at bedtime. 90 mL 2   hydrochlorothiazide (MICROZIDE) 12.5 MG capsule Take 12.5 mg by mouth daily.     labetalol (NORMODYNE) 200 MG tablet Take 200 mg by mouth 2 (two) times daily.     levothyroxine (SYNTHROID) 150 MCG tablet Take 150 mcg by mouth daily.     loperamide (IMODIUM) 2 MG capsule Take by mouth as needed for diarrhea or loose stools.     naproxen (NAPROSYN) 500 MG tablet Take 500 mg by mouth 2 (two) times daily as needed for moderate pain.     oxyCODONE-acetaminophen (PERCOCET/ROXICET) 5-325 MG tablet Take 1 tablet by mouth every 8 (eight) hours as needed for severe pain. 30 tablet 0   prochlorperazine (COMPAZINE) 10 MG tablet Take 1 tablet (10 mg total) by mouth every 6 (six) hours as needed for nausea or vomiting. 30 tablet 2   tadalafil (CIALIS) 5 MG tablet Take 5 mg by mouth daily.     tamsulosin (FLOMAX) 0.4 MG CAPS capsule Take 0.4 mg by mouth daily.     No current facility-administered medications for this visit.    SURGICAL HISTORY:  Past Surgical History:  Procedure Laterality Date   COLONOSCOPY N/A 09/02/2016   Procedure: COLONOSCOPY;  Surgeon: NRogene Houston MD;  [facility]:  [facility];  Service: Endoscopy;  Laterality: N/A;  830   NO PAST SURGERIES     POLYPECTOMY  09/02/2016   Procedure: POLYPECTOMY;  Surgeon: Rogene Houston, MD;  Location: [facility];  Service: Endoscopy;;    REVIEW OF SYSTEMS:   Constitutional: Positive for stable fatigue. Negative for appetite change, chills, fever and unexpected weight change.  HENT: Negative for mouth sores, nosebleeds, sore throat and trouble swallowing.  Eyes: Negative for eye problems and icterus.   Respiratory: Positive for stable shortness of breath with exertion. Negative for hemoptysis, cough and wheezing.  Cardiovascular: Negative for chest pain and leg swelling.  Gastrointestinal: Negative for abdominal pain, constipation, diarrhea, nausea and vomiting.  Genitourinary: Negative for bladder incontinence, difficulty urinating, dysuria, frequency and hematuria.   Musculoskeletal: Negative for back pain, gait problem, neck pain and neck stiffness.  Skin: Positive for hypopigmentation on face/vitiligo .  Neurological: Negative for dizziness, extremity weakness, gait problem, headaches, light-headedness and seizures.  Hematological: Negative for adenopathy. Does not bruise/bleed easily.  Psychiatric/Behavioral: Negative for confusion, depression and sleep disturbance. The patient is not nervous/anxious.   PHYSICAL EXAMINATION:  Blood pressure 126/67, pulse 61, temperature 97.9 F (36.6 C), resp. rate 13, weight 153 lb (69.4 kg), SpO2 94 %.  ECOG PERFORMANCE STATUS: 1-2  Physical Exam  Constitutional: Oriented to person, place, and time and thin appearing male and in no distress.  HENT:  Head: Normocephalic and atraumatic.  Mouth/Throat: Oropharynx is clear and moist. No oropharyngeal exudate.  No evidence of thrush Eyes: Conjunctivae are normal. Right eye exhibits no discharge. Left eye exhibits no discharge. No scleral icterus.  Neck: Normal range of motion. Neck supple.  Cardiovascular: Normal rate, regular rhythm, normal heart sounds and intact distal pulses.   Pulmonary/Chest: Effort normal and breath sounds normal. No respiratory distress. No wheezes. No rales.  Abdominal: Soft. Bowel sounds are normal. Exhibits no distension and no mass. There is no tenderness.  Musculoskeletal: Normal range of motion. Exhibits no edema.  Lymphadenopathy:    No cervical adenopathy.  Neurological: Alert and oriented to person, place, and time. Exhibits muscle wasting.  The patient was  examined in the wheelchair. Skin: Hypopigmentation on his face and upper torso and hands. Skin is warm and dry. Not diaphoretic. No erythema. No pallor.  Psychiatric: Mood, memory and judgment normal.  Vitals reviewed.  LABORATORY DATA: Lab Results  Component Value Date   WBC 3.6 (L) 11/30/2022   HGB 10.7 (L) 11/30/2022   HCT 32.2 (L) 11/30/2022   MCV 90.4 11/30/2022   PLT 182 11/30/2022      Chemistry      Component Value Date/Time   NA 138 11/09/2022 1103   K 4.3 11/09/2022 1103   CL 106 11/09/2022 1103   CO2 27 11/09/2022 1103   BUN 33 (H) 11/09/2022 1103   CREATININE 2.65 (H) 11/09/2022 1103      Component Value Date/Time   CALCIUM 8.9 11/09/2022 1103   ALKPHOS 75 11/09/2022 1103   AST 11 (L) 11/09/2022 1103   ALT 8 11/09/2022 1103   BILITOT 0.5 11/09/2022 1103       RADIOGRAPHIC STUDIES:  CT Chest Wo Contrast  Result Date: 11/05/2022 CLINICAL DATA:  Non-small-cell lung cancer. Restaging. * Tracking Code: BO * EXAM: CT CHEST, ABDOMEN AND PELVIS WITHOUT CONTRAST TECHNIQUE: Multidetector CT imaging of the chest, abdomen and pelvis was performed following the standard protocol without IV contrast. RADIATION DOSE REDUCTION: This exam was performed according to the departmental dose-optimization  program which includes automated exposure control, adjustment of the mA and/or kV according to patient size and/or use of iterative reconstruction technique. COMPARISON:  08/15/2022 FINDINGS: CT CHEST FINDINGS Cardiovascular: The heart size is normal. No substantial pericardial effusion. Mild atherosclerotic calcification is noted in the wall of the thoracic aorta. Mediastinum/Nodes: Scattered small mediastinal lymph nodes are stable. No evidence for gross hilar lymphadenopathy although assessment is limited by the lack of intravenous contrast on the current study. The esophagus has normal imaging features. There is no axillary lymphadenopathy. Lungs/Pleura: Centrilobular and paraseptal  emphysema evident. Bullous changes noted in both lung apices. Pleuroparenchymal scarring in the peripheral aspect of the posterior right upper lobe is associated with right upper lobe volume loss, features stable since prior study. Right parahilar fibrosis is compatible with treatment related change in stable in the interval. No new suspicious pulmonary nodule or mass. No focal airspace consolidation. No pleural effusion. Musculoskeletal: Stable appearance of destructive changes in the lateral right third rib with chronic appearance. Bilateral gynecomastia noted. CT ABDOMEN PELVIS FINDINGS Hepatobiliary: No suspicious focal abnormality in the liver on this study without intravenous contrast. There is no evidence for gallstones, gallbladder wall thickening, or pericholecystic fluid. No intrahepatic or extrahepatic biliary dilation. Pancreas: No focal mass lesion. No dilatation of the main duct. No intraparenchymal cyst. No peripancreatic edema. Spleen: Ill-defined hypoattenuating lesion in the inferior spleen is stable in the interval. Adrenals/Urinary Tract: No adrenal nodule or mass. Stable small cyst upper pole right kidney with additional small hypodensities towards the lower pole, too small to characterize. These are most likely benign cyst. No followup imaging is recommended. Similar appearance of probable cysts in the left kidney. No followup imaging is recommended. No evidence for hydroureter. Circumferential bladder wall thickening evident, accentuated by underdistention. Stomach/Bowel: Stomach is unremarkable. No gastric wall thickening. No evidence of outlet obstruction. Duodenum is normally positioned as is the ligament of Treitz. No small bowel wall thickening. No small bowel dilatation. The terminal ileum is normal. The appendix is not well visualized, but there is no edema or inflammation in the region of the cecum. No gross colonic mass. No colonic wall thickening. Diverticular changes are noted in  the left colon without evidence of diverticulitis. Vascular/Lymphatic: There is moderate atherosclerotic calcification of the abdominal aorta without aneurysm. There is no gastrohepatic or hepatoduodenal ligament lymphadenopathy. No retroperitoneal or mesenteric lymphadenopathy. No pelvic sidewall lymphadenopathy. Reproductive: Prostate gland is enlarged with probable TURP defect. Fiducial markers evident. Other: Trace free fluid is seen in the pelvis, nonspecific. Musculoskeletal: No worrisome lytic or sclerotic osseous abnormality. IMPRESSION: 1. Stable exam. No new or progressive findings to suggest recurrent or metastatic disease in the chest, abdomen, or pelvis. 2. Post treatment scarring in the right upper lobe and parahilar right lung is stable in the interval. 3. Prostatomegaly with probable TURP defect. Circumferential bladder wall thickening noted today and further accentuated by underdistention. Imaging features may be related to chronic bladder outlet obstruction. 4. Trace free fluid in the pelvis, nonspecific. Attention on follow-up recommended. 5. Left colonic diverticulosis without diverticulitis. 6. Aortic Atherosclerosis (ICD10-I70.0) and Emphysema (ICD10-J43.9). Electronically Signed   By: Misty Stanley M.D.   On: 11/05/2022 12:41   CT Abdomen Pelvis Wo Contrast  Result Date: 11/05/2022 CLINICAL DATA:  Non-small-cell lung cancer. Restaging. * Tracking Code: BO * EXAM: CT CHEST, ABDOMEN AND PELVIS WITHOUT CONTRAST TECHNIQUE: Multidetector CT imaging of the chest, abdomen and pelvis was performed following the standard protocol without IV contrast. RADIATION DOSE REDUCTION: This exam  was performed according to the departmental dose-optimization program which includes automated exposure control, adjustment of the mA and/or kV according to patient size and/or use of iterative reconstruction technique. COMPARISON:  08/15/2022 FINDINGS: CT CHEST FINDINGS Cardiovascular: The heart size is normal. No  substantial pericardial effusion. Mild atherosclerotic calcification is noted in the wall of the thoracic aorta. Mediastinum/Nodes: Scattered small mediastinal lymph nodes are stable. No evidence for gross hilar lymphadenopathy although assessment is limited by the lack of intravenous contrast on the current study. The esophagus has normal imaging features. There is no axillary lymphadenopathy. Lungs/Pleura: Centrilobular and paraseptal emphysema evident. Bullous changes noted in both lung apices. Pleuroparenchymal scarring in the peripheral aspect of the posterior right upper lobe is associated with right upper lobe volume loss, features stable since prior study. Right parahilar fibrosis is compatible with treatment related change in stable in the interval. No new suspicious pulmonary nodule or mass. No focal airspace consolidation. No pleural effusion. Musculoskeletal: Stable appearance of destructive changes in the lateral right third rib with chronic appearance. Bilateral gynecomastia noted. CT ABDOMEN PELVIS FINDINGS Hepatobiliary: No suspicious focal abnormality in the liver on this study without intravenous contrast. There is no evidence for gallstones, gallbladder wall thickening, or pericholecystic fluid. No intrahepatic or extrahepatic biliary dilation. Pancreas: No focal mass lesion. No dilatation of the main duct. No intraparenchymal cyst. No peripancreatic edema. Spleen: Ill-defined hypoattenuating lesion in the inferior spleen is stable in the interval. Adrenals/Urinary Tract: No adrenal nodule or mass. Stable small cyst upper pole right kidney with additional small hypodensities towards the lower pole, too small to characterize. These are most likely benign cyst. No followup imaging is recommended. Similar appearance of probable cysts in the left kidney. No followup imaging is recommended. No evidence for hydroureter. Circumferential bladder wall thickening evident, accentuated by underdistention.  Stomach/Bowel: Stomach is unremarkable. No gastric wall thickening. No evidence of outlet obstruction. Duodenum is normally positioned as is the ligament of Treitz. No small bowel wall thickening. No small bowel dilatation. The terminal ileum is normal. The appendix is not well visualized, but there is no edema or inflammation in the region of the cecum. No gross colonic mass. No colonic wall thickening. Diverticular changes are noted in the left colon without evidence of diverticulitis. Vascular/Lymphatic: There is moderate atherosclerotic calcification of the abdominal aorta without aneurysm. There is no gastrohepatic or hepatoduodenal ligament lymphadenopathy. No retroperitoneal or mesenteric lymphadenopathy. No pelvic sidewall lymphadenopathy. Reproductive: Prostate gland is enlarged with probable TURP defect. Fiducial markers evident. Other: Trace free fluid is seen in the pelvis, nonspecific. Musculoskeletal: No worrisome lytic or sclerotic osseous abnormality. IMPRESSION: 1. Stable exam. No new or progressive findings to suggest recurrent or metastatic disease in the chest, abdomen, or pelvis. 2. Post treatment scarring in the right upper lobe and parahilar right lung is stable in the interval. 3. Prostatomegaly with probable TURP defect. Circumferential bladder wall thickening noted today and further accentuated by underdistention. Imaging features may be related to chronic bladder outlet obstruction. 4. Trace free fluid in the pelvis, nonspecific. Attention on follow-up recommended. 5. Left colonic diverticulosis without diverticulitis. 6. Aortic Atherosclerosis (ICD10-I70.0) and Emphysema (ICD10-J43.9). Electronically Signed   By: Misty Stanley M.D.   On: 11/05/2022 12:41     ASSESSMENT/PLAN:  This is a very pleasant 89 year old African-American male diagnosed with a stage IVa (T4, N3, M1 B) non-small cell lung cancer, adenosquamous carcinoma in March 2022 and presented with large mass involving the  lateral right upper lobe and right  chest wall soft tissue in addition to right hilar, mediastinal and right subpectoral and supraclavicular lymphadenopathy with PD-L1 expression of 90%.   The patient is status post a course of concurrent chemoradiation with carboplatin for an AUC of 2 and paclitaxel 45 mg per metered square.  He is status post 6 cycles.    The patient had evidence of disease progression following concurrent chemoradiation.   The patient is currently undergoing single agent immunotherapy with Libtayo IV every 3 weeks due to his PD-L1 expression of 90%.  The patient is status post 29 cycles.  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  He was seen by Dr. Denna Haggard by dermatology.    Labs were reviewed. The patient has baseline CKD. His creatinine is 2.41. Recommend that he proceed with cycle #30 today scheduled.    We will see him back for follow-up visit in 3 weeks for evaluation and repeat blood work before undergoing cycle #31.   We have previously had a discussion about his vitiligo and his immunotherapy and the decision to continue. Given his age and CKD, alternative treatments would likely be challenging for this patient. Therefore, he opted to continue on his current treatment. Of note, he does have KRAS G12C mutation which can be used in the second line setting in the future    The patient was advised to call immediately if she has any concerning symptoms in the interval. The patient voices understanding of current disease status and treatment options and is in agreement with the current care plan. All questions were answered. The patient knows to call the clinic with any problems, questions or concerns. We can certainly see the patient much sooner if necessary   No orders of the defined types were placed in this encounter.    The total time spent in the appointment was 20-29 minutes  Deklyn Gibbon L Patrece Tallie,  PA-C 11/30/22

## 2022-11-30 ENCOUNTER — Other Ambulatory Visit: Payer: Self-pay

## 2022-11-30 ENCOUNTER — Inpatient Hospital Stay (HOSPITAL_BASED_OUTPATIENT_CLINIC_OR_DEPARTMENT_OTHER): Payer: Medicare HMO | Admitting: Physician Assistant

## 2022-11-30 ENCOUNTER — Inpatient Hospital Stay: Payer: Medicare HMO | Attending: Physician Assistant

## 2022-11-30 ENCOUNTER — Inpatient Hospital Stay: Payer: Medicare HMO

## 2022-11-30 ENCOUNTER — Other Ambulatory Visit: Payer: Self-pay | Admitting: Internal Medicine

## 2022-11-30 VITALS — BP 126/67 | HR 61 | Temp 97.9°F | Resp 13 | Wt 153.0 lb

## 2022-11-30 VITALS — HR 66 | Temp 98.0°F | Resp 18

## 2022-11-30 DIAGNOSIS — C3411 Malignant neoplasm of upper lobe, right bronchus or lung: Secondary | ICD-10-CM | POA: Diagnosis not present

## 2022-11-30 DIAGNOSIS — Z5112 Encounter for antineoplastic immunotherapy: Secondary | ICD-10-CM | POA: Diagnosis not present

## 2022-11-30 DIAGNOSIS — C7989 Secondary malignant neoplasm of other specified sites: Secondary | ICD-10-CM | POA: Insufficient documentation

## 2022-11-30 DIAGNOSIS — Z79620 Long term (current) use of immunosuppressive biologic: Secondary | ICD-10-CM | POA: Diagnosis not present

## 2022-11-30 LAB — CMP (CANCER CENTER ONLY)
ALT: 6 U/L (ref 0–44)
AST: 9 U/L — ABNORMAL LOW (ref 15–41)
Albumin: 3.1 g/dL — ABNORMAL LOW (ref 3.5–5.0)
Alkaline Phosphatase: 71 U/L (ref 38–126)
Anion gap: 5 (ref 5–15)
BUN: 30 mg/dL — ABNORMAL HIGH (ref 8–23)
CO2: 26 mmol/L (ref 22–32)
Calcium: 8.4 mg/dL — ABNORMAL LOW (ref 8.9–10.3)
Chloride: 108 mmol/L (ref 98–111)
Creatinine: 2.41 mg/dL — ABNORMAL HIGH (ref 0.61–1.24)
GFR, Estimated: 25 mL/min — ABNORMAL LOW (ref >60)
Glucose, Bld: 96 mg/dL (ref 70–99)
Potassium: 4.3 mmol/L (ref 3.5–5.1)
Sodium: 139 mmol/L (ref 135–145)
Total Bilirubin: 0.4 mg/dL (ref 0.3–1.2)
Total Protein: 7.2 g/dL (ref 6.5–8.1)

## 2022-11-30 LAB — CBC WITH DIFFERENTIAL (CANCER CENTER ONLY)
Abs Immature Granulocytes: 0.01 10*3/uL (ref 0.00–0.07)
Basophils Absolute: 0 10*3/uL (ref 0.0–0.1)
Basophils Relative: 1 %
Eosinophils Absolute: 0.1 10*3/uL (ref 0.0–0.5)
Eosinophils Relative: 3 %
HCT: 32.2 % — ABNORMAL LOW (ref 39.0–52.0)
Hemoglobin: 10.7 g/dL — ABNORMAL LOW (ref 13.0–17.0)
Immature Granulocytes: 0 %
Lymphocytes Relative: 32 %
Lymphs Abs: 1.2 10*3/uL (ref 0.7–4.0)
MCH: 30.1 pg (ref 26.0–34.0)
MCHC: 33.2 g/dL (ref 30.0–36.0)
MCV: 90.4 fL (ref 80.0–100.0)
Monocytes Absolute: 0.3 10*3/uL (ref 0.1–1.0)
Monocytes Relative: 8 %
Neutro Abs: 2 10*3/uL (ref 1.7–7.7)
Neutrophils Relative %: 56 %
Platelet Count: 182 10*3/uL (ref 150–400)
RBC: 3.56 MIL/uL — ABNORMAL LOW (ref 4.22–5.81)
RDW: 15.9 % — ABNORMAL HIGH (ref 11.5–15.5)
WBC Count: 3.6 10*3/uL — ABNORMAL LOW (ref 4.0–10.5)
nRBC: 0 % (ref 0.0–0.2)

## 2022-11-30 LAB — TSH: TSH: 2.693 u[IU]/mL (ref 0.350–4.500)

## 2022-11-30 MED ORDER — SODIUM CHLORIDE 0.9 % IV SOLN: Freq: Once | INTRAVENOUS | Status: AC

## 2022-11-30 MED ORDER — SODIUM CHLORIDE 0.9 % IV SOLN
350.0000 mg | Freq: Once | INTRAVENOUS | Status: AC
  Administered 2022-11-30: 350 mg via INTRAVENOUS
  Filled 2022-11-30: qty 7

## 2022-11-30 NOTE — Patient Instructions (Signed)
[facility]  Discharge Instructions: Thank you for choosing [facility] to provide your oncology and hematology care.   If you have a lab appointment with the [facility], please go directly to the [facility] and check in at the registration area.   Wear comfortable clothing and clothing appropriate for easy access to any Portacath or PICC line.   We strive to give you quality time with your provider. You may need to reschedule your appointment if you arrive late (15 or more minutes).  Arriving late affects you and other patients whose appointments are after yours.  Also, if you miss three or more appointments without notifying the office, you may be dismissed from the clinic at the provider's discretion.      For prescription refill requests, have your pharmacy contact our office and allow 72 hours for refills to be completed.    Today you received the following chemotherapy and/or immunotherapy agent: Libtayo   To help prevent nausea and vomiting after your treatment, we encourage you to take your nausea medication as directed.  BELOW ARE SYMPTOMS THAT SHOULD BE REPORTED IMMEDIATELY: *FEVER GREATER THAN 100.4 F (38 C) OR HIGHER *CHILLS OR SWEATING *NAUSEA AND VOMITING THAT IS NOT CONTROLLED WITH YOUR NAUSEA MEDICATION *UNUSUAL SHORTNESS OF BREATH *UNUSUAL BRUISING OR BLEEDING *URINARY PROBLEMS (pain or burning when urinating, or frequent urination) *BOWEL PROBLEMS (unusual diarrhea, constipation, pain near the anus) TENDERNESS IN MOUTH AND THROAT WITH OR WITHOUT PRESENCE OF ULCERS (sore throat, sores in mouth, or a toothache) UNUSUAL RASH, SWELLING OR PAIN  UNUSUAL VAGINAL DISCHARGE OR ITCHING   Items with * indicate a potential emergency and should be followed up as soon as possible or go to the Emergency Department if any problems should occur.  Please show the CHEMOTHERAPY ALERT CARD or IMMUNOTHERAPY ALERT CARD at check-in to  the Emergency Department and triage nurse.  Should you have questions after your visit or need to cancel or reschedule your appointment, please contact [facility]  Dept: [phone]  and follow the prompts.  Office hours are 8:00 a.m. to 4:30 p.m. Monday - Friday. Please note that voicemails left after 4:00 p.m. may not be returned until the following business day.  We are closed weekends and major holidays. You have access to a nurse at all times for urgent questions. Please call the main number to the clinic Dept: [phone] and follow the prompts.   For any non-urgent questions, you may also contact your provider using MyChart. We now offer e-Visits for anyone 23 and older to request care online for non-urgent symptoms. For details visit [email or web address].   Also download the MyChart app! Go to the app store, search "MyChart", open the app, select [facility], and log in with your MyChart username and password.  Cemiplimab Injection What is this medication? CEMIPLIMAB (se MIP li mab) treats skin cancer and lung cancer. It works by helping your immune system slow or stop the spread of cancer cells. It is a monoclonal antibody. This medicine may be used for other purposes; ask your health care provider or pharmacist if you have questions. COMMON BRAND NAME(S): LIBTAYO What should I tell my care team before I take this medication? They need to know if you have any of these conditions: Allogeneic stem cell transplant (uses someone else's stem cells) Autoimmune diseases, such as Crohn's disease, ulcerative colitis, or lupus Diabetes Nervous system problems, such as myasthenia  gravis or Guillain-Barre syndrome Organ transplant Recent or ongoing radiation Thyroid disease An unusual or allergic reaction to cemiplimab, other medications, foods, dyes, or preservatives Pregnant or trying to get pregnant Breast-feeding How should I use this  medication? This medication is infused into a vein. It is given by your care team in a hospital or clinic setting. A special MedGuide will be given to you before each treatment. Be sure to read this information carefully each time. Talk to your care team about the use of this medication in children. Special care may be needed. Overdosage: If you think you have taken too much of this medicine contact a poison control center or emergency room at once. NOTE: This medicine is only for you. Do not share this medicine with others. What if I miss a dose? Keep appointments for follow-up doses. It is important not to miss your dose. Call your care team if you are unable to keep an appointment. What may interact with this medication? Interactions have not been studied. This list may not describe all possible interactions. Give your health care provider a list of all the medicines, herbs, non-prescription drugs, or dietary supplements you use. Also tell them if you smoke, drink alcohol, or use illegal drugs. Some items may interact with your medicine. What should I watch for while using this medication? This medication may make you feel generally unwell. This is not uncommon as chemotherapy can affect healthy cells as well as cancer cells. Report any side effects. Continue your course of treatment even though you feel ill until your care team tells you to stop. You may need blood work done while you are taking this medication. This medication may cause serious skin reactions. They can happen in the weeks to months after starting the medication. Contact your care team right away if you notice fevers or flu-like symptoms with a rash. The rash may be red or purple and then turn into blisters or peeling of the skin. You may also notice a red rash with swelling of the face, lips, or lymph nodes in your neck or under your arms. Tell your care team right away if you have any changes in your vision. This medication may  increase blood sugar. The risk may be higher in patients who already have diabetes. Ask your care team what you can do to lower your risk of diabetes while taking this medication. Talk to your care team if you wish to become pregnant or think you might be pregnant. This medication can cause serious birth defects if taken during pregnancy. A negative pregnancy test is required before starting this medication. A reliable form of contraception is recommended while taking this medication and for at least 4 months after stopping it. Do not breast-feed while taking this medication and for at least 4 months after stopping it. What side effects may I notice from receiving this medication? Side effects that you should report to your care team as soon as possible: Allergic reactions--skin rash, itching, hives, swelling of the face, lips, tongue, or throat Dry cough, shortness of breath or trouble breathing Eye pain, redness, irritation, or discharge with blurry or decreased vision Heart muscle inflammation--unusual weakness or fatigue, shortness of breath, chest pain, fast or irregular heartbeat, dizziness, swelling of the ankles, feet, or hands Hormone gland problems--headache, sensitivity to light, unusual weakness or fatigue, dizziness, fast or irregular heartbeat, increased sensitivity to cold or heat, excessive sweating, constipation, hair loss, increased thirst or amount of urine, tremors or shaking,  irritability Infusion reactions--chest pain, shortness of breath or trouble breathing, feeling faint or lightheaded Kidney injury (glomerulonephritis)--decrease in the amount of urine, red or dark brown urine, foamy or bubbly urine, swelling of the ankles, hands, or feet Liver injury--right upper belly pain, loss of appetite, nausea, light-colored stool, dark yellow or brown urine, yellowing skin or eyes, unusual weakness or fatigue Pain, tingling, or numbness in the hands or feet, muscle weakness, change in  vision, confusion or trouble speaking, loss of balance or coordination, trouble walking, seizures Rash, fever, and swollen lymph nodes Redness, blistering, peeling, or loosening of the skin, including inside the mouth Sudden or severe stomach pain, bloody diarrhea, fever, nausea, vomiting Side effects that usually do not require medical attention (report these to your care team if they continue or are bothersome): Bone, joint, or muscle pain Diarrhea Fatigue Loss of appetite Nausea Skin rash This list may not describe all possible side effects. Call your doctor for medical advice about side effects. You may report side effects to FDA at 1-800-FDA-1088. Where should I keep my medication? This medication is given in a hospital or clinic and will not be stored at home. NOTE: This sheet is a summary. It may not cover all possible information. If you have questions about this medicine, talk to your doctor, pharmacist, or health care provider.  2023 Elsevier/Gold Standard (2021-08-15 00:00:00)

## 2022-12-02 ENCOUNTER — Other Ambulatory Visit: Payer: Self-pay

## 2022-12-02 LAB — T4: T4, Total: 7.5 ug/dL (ref 4.5–12.0)

## 2022-12-22 ENCOUNTER — Inpatient Hospital Stay: Payer: Medicare HMO

## 2022-12-22 ENCOUNTER — Inpatient Hospital Stay (HOSPITAL_BASED_OUTPATIENT_CLINIC_OR_DEPARTMENT_OTHER): Payer: Medicare HMO | Admitting: Internal Medicine

## 2022-12-22 ENCOUNTER — Other Ambulatory Visit: Payer: Self-pay

## 2022-12-22 VITALS — BP 112/82 | HR 61 | Resp 16

## 2022-12-22 DIAGNOSIS — Z5112 Encounter for antineoplastic immunotherapy: Secondary | ICD-10-CM | POA: Diagnosis not present

## 2022-12-22 DIAGNOSIS — C3411 Malignant neoplasm of upper lobe, right bronchus or lung: Secondary | ICD-10-CM

## 2022-12-22 DIAGNOSIS — C7989 Secondary malignant neoplasm of other specified sites: Secondary | ICD-10-CM | POA: Diagnosis not present

## 2022-12-22 DIAGNOSIS — Z79620 Long term (current) use of immunosuppressive biologic: Secondary | ICD-10-CM | POA: Diagnosis not present

## 2022-12-22 LAB — CMP (CANCER CENTER ONLY)
ALT: 6 U/L (ref 0–44)
AST: 9 U/L — ABNORMAL LOW (ref 15–41)
Albumin: 3 g/dL — ABNORMAL LOW (ref 3.5–5.0)
Alkaline Phosphatase: 63 U/L (ref 38–126)
Anion gap: 6 (ref 5–15)
BUN: 35 mg/dL — ABNORMAL HIGH (ref 8–23)
CO2: 23 mmol/L (ref 22–32)
Calcium: 8.5 mg/dL — ABNORMAL LOW (ref 8.9–10.3)
Chloride: 106 mmol/L (ref 98–111)
Creatinine: 2.83 mg/dL — ABNORMAL HIGH (ref 0.61–1.24)
GFR, Estimated: 21 mL/min — ABNORMAL LOW (ref >60)
Glucose, Bld: 110 mg/dL — ABNORMAL HIGH (ref 70–99)
Potassium: 4.2 mmol/L (ref 3.5–5.1)
Sodium: 135 mmol/L (ref 135–145)
Total Bilirubin: 0.4 mg/dL (ref 0.3–1.2)
Total Protein: 7.2 g/dL (ref 6.5–8.1)

## 2022-12-22 LAB — CBC WITH DIFFERENTIAL (CANCER CENTER ONLY)
Abs Immature Granulocytes: 0.01 10*3/uL (ref 0.00–0.07)
Basophils Absolute: 0 10*3/uL (ref 0.0–0.1)
Basophils Relative: 1 %
Eosinophils Absolute: 0.1 10*3/uL (ref 0.0–0.5)
Eosinophils Relative: 3 %
HCT: 32.3 % — ABNORMAL LOW (ref 39.0–52.0)
Hemoglobin: 10.7 g/dL — ABNORMAL LOW (ref 13.0–17.0)
Immature Granulocytes: 0 %
Lymphocytes Relative: 24 %
Lymphs Abs: 0.9 10*3/uL (ref 0.7–4.0)
MCH: 30.1 pg (ref 26.0–34.0)
MCHC: 33.1 g/dL (ref 30.0–36.0)
MCV: 90.7 fL (ref 80.0–100.0)
Monocytes Absolute: 0.3 10*3/uL (ref 0.1–1.0)
Monocytes Relative: 9 %
Neutro Abs: 2.5 10*3/uL (ref 1.7–7.7)
Neutrophils Relative %: 63 %
Platelet Count: 205 10*3/uL (ref 150–400)
RBC: 3.56 MIL/uL — ABNORMAL LOW (ref 4.22–5.81)
RDW: 15.9 % — ABNORMAL HIGH (ref 11.5–15.5)
WBC Count: 3.9 10*3/uL — ABNORMAL LOW (ref 4.0–10.5)
nRBC: 0 % (ref 0.0–0.2)

## 2022-12-22 LAB — TSH: TSH: 8.18 u[IU]/mL — ABNORMAL HIGH (ref 0.350–4.500)

## 2022-12-22 MED ORDER — SODIUM CHLORIDE 0.9 % IV SOLN
350.0000 mg | Freq: Once | INTRAVENOUS | Status: AC
  Administered 2022-12-22: 350 mg via INTRAVENOUS
  Filled 2022-12-22: qty 7

## 2022-12-22 MED ORDER — SODIUM CHLORIDE 0.9 % IV SOLN: Freq: Once | INTRAVENOUS | Status: AC

## 2022-12-22 NOTE — Patient Instructions (Signed)
[facility]  Discharge Instructions: Thank you for choosing [facility] to provide your oncology and hematology care.   If you have a lab appointment with the Cancer Center, please go directly to the Cancer Center and check in at the registration area.   Wear comfortable clothing and clothing appropriate for easy access to any Portacath or PICC line.   We strive to give you quality time with your provider. You may need to reschedule your appointment if you arrive late (15 or more minutes).  Arriving late affects you and other patients whose appointments are after yours.  Also, if you miss three or more appointments without notifying the office, you may be dismissed from the clinic at the provider's discretion.      For prescription refill requests, have your pharmacy contact our office and allow 72 hours for refills to be completed.    Today you received the following chemotherapy and/or immunotherapy agent: Libtayo   To help prevent nausea and vomiting after your treatment, we encourage you to take your nausea medication as directed.  BELOW ARE SYMPTOMS THAT SHOULD BE REPORTED IMMEDIATELY: *FEVER GREATER THAN 100.4 F (38 C) OR HIGHER *CHILLS OR SWEATING *NAUSEA AND VOMITING THAT IS NOT CONTROLLED WITH YOUR NAUSEA MEDICATION *UNUSUAL SHORTNESS OF BREATH *UNUSUAL BRUISING OR BLEEDING *URINARY PROBLEMS (pain or burning when urinating, or frequent urination) *BOWEL PROBLEMS (unusual diarrhea, constipation, pain near the anus) TENDERNESS IN MOUTH AND THROAT WITH OR WITHOUT PRESENCE OF ULCERS (sore throat, sores in mouth, or a toothache) UNUSUAL RASH, SWELLING OR PAIN  UNUSUAL VAGINAL DISCHARGE OR ITCHING   Items with * indicate a potential emergency and should be followed up as soon as possible or go to the Emergency Department if any problems should occur.  Please show the CHEMOTHERAPY ALERT CARD or IMMUNOTHERAPY ALERT CARD at check-in to  the Emergency Department and triage nurse.  Should you have questions after your visit or need to cancel or reschedule your appointment, please contact [facility]  Dept: [phone]  and follow the prompts.  Office hours are 8:00 a.m. to 4:30 p.m. Monday - Friday. Please note that voicemails left after 4:00 p.m. may not be returned until the following business day.  We are closed weekends and major holidays. You have access to a nurse at all times for urgent questions. Please call the main number to the clinic Dept: [phone] and follow the prompts.   For any non-urgent questions, you may also contact your provider using MyChart. We now offer e-Visits for anyone 18 and older to request care online for non-urgent symptoms. For details visit [email or web address].   Also download the MyChart app! Go to the app store, search "MyChart", open the app, select [facility], and log in with your MyChart username and password.  Cemiplimab Injection What is this medication? CEMIPLIMAB (se MIP li mab) treats skin cancer and lung cancer. It works by helping your immune system slow or stop the spread of cancer cells. It is a monoclonal antibody. This medicine may be used for other purposes; ask your health care provider or pharmacist if you have questions. COMMON BRAND NAME(S): LIBTAYO What should I tell my care team before I take this medication? They need to know if you have any of these conditions: Allogeneic stem cell transplant (uses someone else's stem cells) Autoimmune diseases, such as Crohn's disease, ulcerative colitis, or lupus Diabetes Nervous system problems, such as myasthenia   gravis or Guillain-Barre syndrome Organ transplant Recent or ongoing radiation Thyroid disease An unusual or allergic reaction to cemiplimab, other medications, foods, dyes, or preservatives Pregnant or trying to get pregnant Breast-feeding How should I use this  medication? This medication is infused into a vein. It is given by your care team in a hospital or clinic setting. A special MedGuide will be given to you before each treatment. Be sure to read this information carefully each time. Talk to your care team about the use of this medication in children. Special care may be needed. Overdosage: If you think you have taken too much of this medicine contact a poison control center or emergency room at once. NOTE: This medicine is only for you. Do not share this medicine with others. What if I miss a dose? Keep appointments for follow-up doses. It is important not to miss your dose. Call your care team if you are unable to keep an appointment. What may interact with this medication? Interactions have not been studied. This list may not describe all possible interactions. Give your health care provider a list of all the medicines, herbs, non-prescription drugs, or dietary supplements you use. Also tell them if you smoke, drink alcohol, or use illegal drugs. Some items may interact with your medicine. What should I watch for while using this medication? This medication may make you feel generally unwell. This is not uncommon as chemotherapy can affect healthy cells as well as cancer cells. Report any side effects. Continue your course of treatment even though you feel ill until your care team tells you to stop. You may need blood work done while you are taking this medication. This medication may cause serious skin reactions. They can happen in the weeks to months after starting the medication. Contact your care team right away if you notice fevers or flu-like symptoms with a rash. The rash may be red or purple and then turn into blisters or peeling of the skin. You may also notice a red rash with swelling of the face, lips, or lymph nodes in your neck or under your arms. Tell your care team right away if you have any changes in your vision. This medication may  increase blood sugar. The risk may be higher in patients who already have diabetes. Ask your care team what you can do to lower your risk of diabetes while taking this medication. Talk to your care team if you wish to become pregnant or think you might be pregnant. This medication can cause serious birth defects if taken during pregnancy. A negative pregnancy test is required before starting this medication. A reliable form of contraception is recommended while taking this medication and for at least 4 months after stopping it. Do not breast-feed while taking this medication and for at least 4 months after stopping it. What side effects may I notice from receiving this medication? Side effects that you should report to your care team as soon as possible: Allergic reactions--skin rash, itching, hives, swelling of the face, lips, tongue, or throat Dry cough, shortness of breath or trouble breathing Eye pain, redness, irritation, or discharge with blurry or decreased vision Heart muscle inflammation--unusual weakness or fatigue, shortness of breath, chest pain, fast or irregular heartbeat, dizziness, swelling of the ankles, feet, or hands Hormone gland problems--headache, sensitivity to light, unusual weakness or fatigue, dizziness, fast or irregular heartbeat, increased sensitivity to cold or heat, excessive sweating, constipation, hair loss, increased thirst or amount of urine, tremors or shaking,   irritability Infusion reactions--chest pain, shortness of breath or trouble breathing, feeling faint or lightheaded Kidney injury (glomerulonephritis)--decrease in the amount of urine, red or dark brown urine, foamy or bubbly urine, swelling of the ankles, hands, or feet Liver injury--right upper belly pain, loss of appetite, nausea, light-colored stool, dark yellow or brown urine, yellowing skin or eyes, unusual weakness or fatigue Pain, tingling, or numbness in the hands or feet, muscle weakness, change in  vision, confusion or trouble speaking, loss of balance or coordination, trouble walking, seizures Rash, fever, and swollen lymph nodes Redness, blistering, peeling, or loosening of the skin, including inside the mouth Sudden or severe stomach pain, bloody diarrhea, fever, nausea, vomiting Side effects that usually do not require medical attention (report these to your care team if they continue or are bothersome): Bone, joint, or muscle pain Diarrhea Fatigue Loss of appetite Nausea Skin rash This list may not describe all possible side effects. Call your doctor for medical advice about side effects. You may report side effects to FDA at 1-800-FDA-1088. Where should I keep my medication? This medication is given in a hospital or clinic and will not be stored at home. NOTE: This sheet is a summary. It may not cover all possible information. If you have questions about this medicine, talk to your doctor, pharmacist, or health care provider.  2023 Elsevier/Gold Standard (2021-08-15 00:00:00)   

## 2022-12-22 NOTE — Progress Notes (Signed)
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  Biomarker Findings Tumor Mutational Burden - 10 Muts/Mb Microsatellite status - MS-Stable Genomic Findings For a complete list of the genes assayed, please refer to the Appendix. KRAS G12C NFKBIA amplification NKX2-1 amplification RBM10 D688fs*80 7 Disease relevant genes with no reportable alterations: ALK, BRAF, EGFR, ERBB2, MET, RET, ROS1   PDL1: 90%   PRIOR THERAPY:  Weekly concurrent chemoradiation with carboplatin for an AUC of 2 and paclitaxel 45 mg/m.  First dose on 12/30/2020.  Status post 6 cycles.   CURRENT THERAPY: First-line treatment with immunotherapy with Libtayo (Cempilimab) 350 Mg IV every 3 weeks.  Status post 30 cycles.  INTERVAL HISTORY: [name] 89 y.o. male turns to the clinic today for follow-up visit accompanied by his son-in-law.  The patient is feeling fine today with no concerning complaints.  He denied having any chest pain, shortness of breath, cough or hemoptysis.  He has no nausea, vomiting, diarrhea or constipation.  He has no headache or visual changes.  He has no weight loss or night sweats.  He continues to tolerate his treatment with Libtayo (Cempilimab) fairly well.  He is here today for evaluation before starting cycle #31.  MEDICAL HISTORY: Past Medical History:  Diagnosis Date   Arthritis    Hypertension    Hyperthyroidism    lung ca 11/2020    ALLERGIES:  has No Known Allergies.  MEDICATIONS:  Current Outpatient Medications  Medication Sig Dispense Refill   amLODipine  (NORVASC) 10 MG tablet Take 10 mg by mouth daily.     diclofenac Sodium (VOLTAREN) 1 % GEL APPLY 2 GRAMS TO THE AFFECTED AREA(S) BY TOPICAL ROUTE 2 TIMES PER DAY     gabapentin (NEURONTIN) 250 MG/5ML solution Take 3 mLs (150 mg total) by mouth at bedtime. 90 mL 2   hydrochlorothiazide (MICROZIDE) 12.5 MG capsule Take 12.5 mg by mouth daily.     labetalol (NORMODYNE) 200 MG tablet Take 200 mg by mouth 2 (two) times daily.     levothyroxine (SYNTHROID) 150 MCG tablet Take 150 mcg by mouth daily.     loperamide (IMODIUM) 2 MG capsule Take by mouth as needed for diarrhea or loose stools.     naproxen (NAPROSYN) 500 MG tablet Take 500 mg by mouth 2 (two) times daily as needed for moderate pain.     oxyCODONE-acetaminophen (PERCOCET/ROXICET) 5-325 MG tablet Take 1 tablet by mouth every 8 (eight) hours as needed for severe pain. 30 tablet 0   prochlorperazine (COMPAZINE) 10 MG tablet Take 1 tablet (10 mg total) by mouth every 6 (six) hours as needed for nausea or vomiting. 30 tablet 2   tadalafil (CIALIS) 5 MG tablet Take 5 mg by mouth daily.     tamsulosin (FLOMAX) 0.4 MG CAPS capsule Take 0.4 mg by mouth daily.     No current facility-administered medications for this visit.    SURGICAL HISTORY:  Past Surgical History:  Procedure Laterality Date   COLONOSCOPY N/A 09/02/2016   Procedure: COLONOSCOPY;  Surgeon: Rogene Houston, MD;  Location: [facility];  Service: Endoscopy;  Laterality: N/A;  830   NO PAST SURGERIES     POLYPECTOMY  09/02/2016   Procedure: POLYPECTOMY;  Surgeon: Rogene Houston, MD;  Location: [facility];  Service: Endoscopy;;    REVIEW OF SYSTEMS:  A comprehensive review of systems was negative.   PHYSICAL EXAMINATION: General appearance: alert, cooperative, and no distress Head: Normocephalic, without obvious abnormality, atraumatic Neck: no adenopathy, no JVD, supple, symmetrical, trachea midline, and thyroid not enlarged, symmetric, no tenderness/mass/nodules Lymph  nodes: Cervical, supraclavicular, and axillary nodes normal. Resp: clear to auscultation bilaterally Back: symmetric, no curvature. ROM normal. No CVA tenderness. Cardio: regular rate and rhythm, S1, S2 normal, no murmur, click, rub or gallop GI: soft, non-tender; bowel sounds normal; no masses,  no organomegaly Extremities: extremities normal, atraumatic, no cyanosis or edema  ECOG PERFORMANCE STATUS: 1 - Symptomatic but completely ambulatory  Blood pressure 114/64, pulse 61, temperature 98.2 F (36.8 C), temperature source Oral, resp. rate 16, weight 150 lb 9.6 oz (68.3 kg), SpO2 97 %.  LABORATORY DATA: Lab Results  Component Value Date   WBC 3.9 (L) 12/22/2022   HGB 10.7 (L) 12/22/2022   HCT 32.3 (L) 12/22/2022   MCV 90.7 12/22/2022   PLT 205 12/22/2022      Chemistry      Component Value Date/Time   NA 139 11/30/2022 1401   K 4.3 11/30/2022 1401   CL 108 11/30/2022 1401   CO2 26 11/30/2022 1401   BUN 30 (H) 11/30/2022 1401   CREATININE 2.41 (H) 11/30/2022 1401      Component Value Date/Time   CALCIUM 8.4 (L) 11/30/2022 1401   ALKPHOS 71 11/30/2022 1401   AST 9 (L) 11/30/2022 1401   ALT 6 11/30/2022 1401   BILITOT 0.4 11/30/2022 1401       RADIOGRAPHIC STUDIES: No results found.  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  The patient underwent a course of concurrent chemoradiation with weekly carboplatin for AUC of 2 and paclitaxel 45 Mg/M2 status post 6 cycles. He tolerated the previous course of his concurrent chemoradiation fairly well except for fatigue. Unfortunately his CT scan of the  chest after the induction phase showed interval enlargement of a large mass centered at the periphery of the right upper lobe and extending into the adjacent chest wall measuring 8.4 x 6.7 cm with bony destruction of the right third and fourth ribs substantially increased compared to the prior examination and the findings are consistent with worsened malignancy. The patient has PD-L1 expression of 90% and I felt he will be a good candidate for treatment with first-line single agent immunotherapy. I recommended for him treatment with Libtayo (Cempilimab) 350 Mg IV every 3 weeks.  He is status post 30 cycles of treatment.  He has been tolerating this treatment fairly well with no concerning adverse effects. I recommended for the patient to continue his current treatment with Libtayo (Cempilimab) and he will proceed with cycle #31 today. For the hypothyroidism, the patient will continue his current treatment with levothyroxine. I will see him back for follow-up visit in 3 weeks for evaluation before the next cycle of his treatment. He was advised to call  immediately if he has any concerning symptoms in the interval. The patient voices understanding of current disease status and treatment options and is in agreement with the current care plan.  All questions were answered. The patient knows to call the clinic with any problems, questions or concerns. We can certainly see the patient much sooner if necessary.   Disclaimer: This note was dictated with voice recognition software. Similar sounding words can inadvertently be transcribed and may not be corrected upon review.

## 2022-12-22 NOTE — Progress Notes (Signed)
Patient seen by MD today  Vitals are within treatment parameters.  Labs reviewed: and are not all within treatment parameters. Per Dr. Julien Nordmann , it is ok  to treat pt today with Cemiplimab amd creatinine of 2.83.  Per physician team, patient is ready for treatment and there are NO modifications to the treatment plan.

## 2022-12-23 LAB — T4: T4, Total: 6.6 ug/dL (ref 4.5–12.0)

## 2022-12-29 ENCOUNTER — Other Ambulatory Visit: Payer: Self-pay

## 2023-01-03 ENCOUNTER — Other Ambulatory Visit: Payer: Self-pay

## 2023-01-08 DIAGNOSIS — G3184 Mild cognitive impairment, so stated: Secondary | ICD-10-CM | POA: Diagnosis not present

## 2023-01-08 DIAGNOSIS — Z87891 Personal history of nicotine dependence: Secondary | ICD-10-CM | POA: Diagnosis not present

## 2023-01-08 DIAGNOSIS — I700 Atherosclerosis of aorta: Secondary | ICD-10-CM | POA: Diagnosis not present

## 2023-01-08 DIAGNOSIS — M8580 Other specified disorders of bone density and structure, unspecified site: Secondary | ICD-10-CM | POA: Diagnosis not present

## 2023-01-08 DIAGNOSIS — J479 Bronchiectasis, uncomplicated: Secondary | ICD-10-CM | POA: Diagnosis not present

## 2023-01-08 DIAGNOSIS — I129 Hypertensive chronic kidney disease with stage 1 through stage 4 chronic kidney disease, or unspecified chronic kidney disease: Secondary | ICD-10-CM | POA: Diagnosis not present

## 2023-01-08 DIAGNOSIS — N400 Enlarged prostate without lower urinary tract symptoms: Secondary | ICD-10-CM | POA: Diagnosis not present

## 2023-01-08 DIAGNOSIS — C3490 Malignant neoplasm of unspecified part of unspecified bronchus or lung: Secondary | ICD-10-CM | POA: Diagnosis not present

## 2023-01-08 DIAGNOSIS — E039 Hypothyroidism, unspecified: Secondary | ICD-10-CM | POA: Diagnosis not present

## 2023-01-08 DIAGNOSIS — M1990 Unspecified osteoarthritis, unspecified site: Secondary | ICD-10-CM | POA: Diagnosis not present

## 2023-01-08 DIAGNOSIS — N529 Male erectile dysfunction, unspecified: Secondary | ICD-10-CM | POA: Diagnosis not present

## 2023-01-08 DIAGNOSIS — N184 Chronic kidney disease, stage 4 (severe): Secondary | ICD-10-CM | POA: Diagnosis not present

## 2023-01-11 ENCOUNTER — Inpatient Hospital Stay: Payer: Medicare HMO | Attending: Physician Assistant

## 2023-01-11 ENCOUNTER — Other Ambulatory Visit: Payer: Self-pay

## 2023-01-11 ENCOUNTER — Inpatient Hospital Stay (HOSPITAL_BASED_OUTPATIENT_CLINIC_OR_DEPARTMENT_OTHER): Payer: Medicare HMO | Admitting: Internal Medicine

## 2023-01-11 ENCOUNTER — Encounter: Payer: Self-pay | Admitting: Medical Oncology

## 2023-01-11 ENCOUNTER — Inpatient Hospital Stay: Payer: Medicare HMO

## 2023-01-11 VITALS — BP 111/56 | HR 65 | Temp 97.8°F | Resp 16 | Ht 74.0 in

## 2023-01-11 VITALS — BP 104/66 | HR 64 | Temp 99.9°F | Resp 16 | Wt 151.0 lb

## 2023-01-11 DIAGNOSIS — Z5112 Encounter for antineoplastic immunotherapy: Secondary | ICD-10-CM | POA: Insufficient documentation

## 2023-01-11 DIAGNOSIS — C3411 Malignant neoplasm of upper lobe, right bronchus or lung: Secondary | ICD-10-CM | POA: Diagnosis not present

## 2023-01-11 DIAGNOSIS — Z79620 Long term (current) use of immunosuppressive biologic: Secondary | ICD-10-CM | POA: Diagnosis not present

## 2023-01-11 DIAGNOSIS — C3490 Malignant neoplasm of unspecified part of unspecified bronchus or lung: Secondary | ICD-10-CM | POA: Diagnosis not present

## 2023-01-11 DIAGNOSIS — C7989 Secondary malignant neoplasm of other specified sites: Secondary | ICD-10-CM | POA: Diagnosis not present

## 2023-01-11 LAB — CBC WITH DIFFERENTIAL (CANCER CENTER ONLY)
Abs Immature Granulocytes: 0.01 10*3/uL (ref 0.00–0.07)
Basophils Absolute: 0 10*3/uL (ref 0.0–0.1)
Basophils Relative: 0 %
Eosinophils Absolute: 0.2 10*3/uL (ref 0.0–0.5)
Eosinophils Relative: 4 %
HCT: 30.5 % — ABNORMAL LOW (ref 39.0–52.0)
Hemoglobin: 9.9 g/dL — ABNORMAL LOW (ref 13.0–17.0)
Immature Granulocytes: 0 %
Lymphocytes Relative: 28 %
Lymphs Abs: 1.3 10*3/uL (ref 0.7–4.0)
MCH: 29.9 pg (ref 26.0–34.0)
MCHC: 32.5 g/dL (ref 30.0–36.0)
MCV: 92.1 fL (ref 80.0–100.0)
Monocytes Absolute: 0.4 10*3/uL (ref 0.1–1.0)
Monocytes Relative: 8 %
Neutro Abs: 2.7 10*3/uL (ref 1.7–7.7)
Neutrophils Relative %: 60 %
Platelet Count: 193 10*3/uL (ref 150–400)
RBC: 3.31 MIL/uL — ABNORMAL LOW (ref 4.22–5.81)
RDW: 15.7 % — ABNORMAL HIGH (ref 11.5–15.5)
WBC Count: 4.5 10*3/uL (ref 4.0–10.5)
nRBC: 0 % (ref 0.0–0.2)

## 2023-01-11 LAB — TSH: TSH: 1.992 u[IU]/mL (ref 0.350–4.500)

## 2023-01-11 LAB — CMP (CANCER CENTER ONLY)
ALT: 6 U/L (ref 0–44)
AST: 9 U/L — ABNORMAL LOW (ref 15–41)
Albumin: 2.9 g/dL — ABNORMAL LOW (ref 3.5–5.0)
Alkaline Phosphatase: 62 U/L (ref 38–126)
Anion gap: 6 (ref 5–15)
BUN: 48 mg/dL — ABNORMAL HIGH (ref 8–23)
CO2: 24 mmol/L (ref 22–32)
Calcium: 8.5 mg/dL — ABNORMAL LOW (ref 8.9–10.3)
Chloride: 106 mmol/L (ref 98–111)
Creatinine: 3.53 mg/dL — ABNORMAL HIGH (ref 0.61–1.24)
GFR, Estimated: 16 mL/min — ABNORMAL LOW (ref >60)
Glucose, Bld: 110 mg/dL — ABNORMAL HIGH (ref 70–99)
Potassium: 4.3 mmol/L (ref 3.5–5.1)
Sodium: 136 mmol/L (ref 135–145)
Total Bilirubin: 0.5 mg/dL (ref 0.3–1.2)
Total Protein: 7.5 g/dL (ref 6.5–8.1)

## 2023-01-11 MED ORDER — SODIUM CHLORIDE 0.9 % IV SOLN: Freq: Once | INTRAVENOUS | Status: AC

## 2023-01-11 MED ORDER — SODIUM CHLORIDE 0.9 % IV SOLN
350.0000 mg | Freq: Once | INTRAVENOUS | Status: AC
  Administered 2023-01-11: 350 mg via INTRAVENOUS
  Filled 2023-01-11: qty 7

## 2023-01-11 NOTE — Progress Notes (Unsigned)
Patient seen by Dr. Mohamed  Vitals are within treatment parameters.  Labs reviewed: and are within treatment parameters.  Per physician team, patient is ready for treatment and there are NO modifications to the treatment plan.  

## 2023-01-11 NOTE — Patient Instructions (Signed)
[facility]  Discharge Instructions: Thank you for choosing [facility] to provide your oncology and hematology care.   If you have a lab appointment with the Cancer Center, please go directly to the Cancer Center and check in at the registration area.   Wear comfortable clothing and clothing appropriate for easy access to any Portacath or PICC line.   We strive to give you quality time with your provider. You may need to reschedule your appointment if you arrive late (15 or more minutes).  Arriving late affects you and other patients whose appointments are after yours.  Also, if you miss three or more appointments without notifying the office, you may be dismissed from the clinic at the provider's discretion.      For prescription refill requests, have your pharmacy contact our office and allow 72 hours for refills to be completed.    Today you received the following chemotherapy and/or immunotherapy agent: Cemiplimab (Libtayo)    To help prevent nausea and vomiting after your treatment, we encourage you to take your nausea medication as directed.  BELOW ARE SYMPTOMS THAT SHOULD BE REPORTED IMMEDIATELY: *FEVER GREATER THAN 100.4 F (38 C) OR HIGHER *CHILLS OR SWEATING *NAUSEA AND VOMITING THAT IS NOT CONTROLLED WITH YOUR NAUSEA MEDICATION *UNUSUAL SHORTNESS OF BREATH *UNUSUAL BRUISING OR BLEEDING *URINARY PROBLEMS (pain or burning when urinating, or frequent urination) *BOWEL PROBLEMS (unusual diarrhea, constipation, pain near the anus) TENDERNESS IN MOUTH AND THROAT WITH OR WITHOUT PRESENCE OF ULCERS (sore throat, sores in mouth, or a toothache) UNUSUAL RASH, SWELLING OR PAIN  UNUSUAL VAGINAL DISCHARGE OR ITCHING   Items with * indicate a potential emergency and should be followed up as soon as possible or go to the Emergency Department if any problems should occur.  Please show the CHEMOTHERAPY ALERT CARD or IMMUNOTHERAPY ALERT CARD  at check-in to the Emergency Department and triage nurse.  Should you have questions after your visit or need to cancel or reschedule your appointment, please contact [facility]  Dept: [phone]  and follow the prompts.  Office hours are 8:00 a.m. to 4:30 p.m. Monday - Friday. Please note that voicemails left after 4:00 p.m. may not be returned until the following business day.  We are closed weekends and major holidays. You have access to a nurse at all times for urgent questions. Please call the main number to the clinic Dept: [phone] and follow the prompts.   For any non-urgent questions, you may also contact your provider using MyChart. We now offer e-Visits for anyone 18 and older to request care online for non-urgent symptoms. For details visit [email or web address].   Also download the MyChart app! Go to the app store, search "MyChart", open the app, select [facility], and log in with your MyChart username and password.  Cemiplimab Injection What is this medication? CEMIPLIMAB (se MIP li mab) treats skin cancer and lung cancer. It works by helping your immune system slow or stop the spread of cancer cells. It is a monoclonal antibody. This medicine may be used for other purposes; ask your health care provider or pharmacist if you have questions. COMMON BRAND NAME(S): LIBTAYO What should I tell my care team before I take this medication? They need to know if you have any of these conditions: Allogeneic stem cell transplant (uses someone else's stem cells) Autoimmune diseases, such as Crohn's disease, ulcerative colitis, or lupus Diabetes Nervous system problems, such   as myasthenia gravis or Guillain-Barre syndrome Organ transplant Recent or ongoing radiation Thyroid disease An unusual or allergic reaction to cemiplimab, other medications, foods, dyes, or preservatives Pregnant or trying to get pregnant Breast-feeding How should I use  this medication? This medication is infused into a vein. It is given by your care team in a hospital or clinic setting. A special MedGuide will be given to you before each treatment. Be sure to read this information carefully each time. Talk to your care team about the use of this medication in children. Special care may be needed. Overdosage: If you think you have taken too much of this medicine contact a poison control center or emergency room at once. NOTE: This medicine is only for you. Do not share this medicine with others. What if I miss a dose? Keep appointments for follow-up doses. It is important not to miss your dose. Call your care team if you are unable to keep an appointment. What may interact with this medication? Interactions have not been studied. This list may not describe all possible interactions. Give your health care provider a list of all the medicines, herbs, non-prescription drugs, or dietary supplements you use. Also tell them if you smoke, drink alcohol, or use illegal drugs. Some items may interact with your medicine. What should I watch for while using this medication? This medication may make you feel generally unwell. This is not uncommon as chemotherapy can affect healthy cells as well as cancer cells. Report any side effects. Continue your course of treatment even though you feel ill until your care team tells you to stop. You may need blood work done while you are taking this medication. This medication may cause serious skin reactions. They can happen in the weeks to months after starting the medication. Contact your care team right away if you notice fevers or flu-like symptoms with a rash. The rash may be red or purple and then turn into blisters or peeling of the skin. You may also notice a red rash with swelling of the face, lips, or lymph nodes in your neck or under your arms. Tell your care team right away if you have any changes in your vision. This  medication may increase blood sugar. The risk may be higher in patients who already have diabetes. Ask your care team what you can do to lower your risk of diabetes while taking this medication. Talk to your care team if you wish to become pregnant or think you might be pregnant. This medication can cause serious birth defects if taken during pregnancy. A negative pregnancy test is required before starting this medication. A reliable form of contraception is recommended while taking this medication and for at least 4 months after stopping it. Do not breast-feed while taking this medication and for at least 4 months after stopping it. What side effects may I notice from receiving this medication? Side effects that you should report to your care team as soon as possible: Allergic reactions--skin rash, itching, hives, swelling of the face, lips, tongue, or throat Dry cough, shortness of breath or trouble breathing Eye pain, redness, irritation, or discharge with blurry or decreased vision Heart muscle inflammation--unusual weakness or fatigue, shortness of breath, chest pain, fast or irregular heartbeat, dizziness, swelling of the ankles, feet, or hands Hormone gland problems--headache, sensitivity to light, unusual weakness or fatigue, dizziness, fast or irregular heartbeat, increased sensitivity to cold or heat, excessive sweating, constipation, hair loss, increased thirst or amount of urine, tremors   or shaking, irritability Infusion reactions--chest pain, shortness of breath or trouble breathing, feeling faint or lightheaded Kidney injury (glomerulonephritis)--decrease in the amount of urine, red or dark brown urine, foamy or bubbly urine, swelling of the ankles, hands, or feet Liver injury--right upper belly pain, loss of appetite, nausea, light-colored stool, dark yellow or brown urine, yellowing skin or eyes, unusual weakness or fatigue Pain, tingling, or numbness in the hands or feet, muscle  weakness, change in vision, confusion or trouble speaking, loss of balance or coordination, trouble walking, seizures Rash, fever, and swollen lymph nodes Redness, blistering, peeling, or loosening of the skin, including inside the mouth Sudden or severe stomach pain, bloody diarrhea, fever, nausea, vomiting Side effects that usually do not require medical attention (report these to your care team if they continue or are bothersome): Bone, joint, or muscle pain Diarrhea Fatigue Loss of appetite Nausea Skin rash This list may not describe all possible side effects. Call your doctor for medical advice about side effects. You may report side effects to FDA at 1-800-FDA-1088. Where should I keep my medication? This medication is given in a hospital or clinic and will not be stored at home. NOTE: This sheet is a summary. It may not cover all possible information. If you have questions about this medicine, talk to your doctor, pharmacist, or health care provider.  2023 Elsevier/Gold Standard (2021-08-15 00:00:00)   

## 2023-01-11 NOTE — Progress Notes (Signed)
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  Biomarker Findings Tumor Mutational Burden - 10 Muts/Mb Microsatellite status - MS-Stable Genomic Findings For a complete list of the genes assayed, please refer to the Appendix. KRAS G12C NFKBIA amplification NKX2-1 amplification RBM10 [id number]*80 7 Disease relevant genes with no reportable alterations: ALK, BRAF, EGFR, ERBB2, MET, RET, ROS1   PDL1: 90%   PRIOR THERAPY:  Weekly concurrent chemoradiation with carboplatin for an AUC of 2 and paclitaxel 45 mg/m.  First dose on 12/30/2020.  Status post 6 cycles.   CURRENT THERAPY: First-line treatment with immunotherapy with Libtayo (Cempilimab) 350 Mg IV every 3 weeks.  Status post 31 cycles.  INTERVAL HISTORY: [name] 89 y.o. male returns to the clinic today for follow-up visit accompanied by his son-in-law.  The patient is feeling fine today with no concerning complaints.  He denied having any current chest pain, shortness of breath, cough or hemoptysis.  He has no nausea, vomiting, diarrhea or constipation.  He has no headache or visual changes.  He denied having any recent weight loss or night sweats.  He has been tolerating his treatment with Libtayo (Cempilimab) fairly well.  He is here today for evaluation before starting cycle #32.  MEDICAL HISTORY: Past Medical History:  Diagnosis Date   Arthritis    Hypertension    Hyperthyroidism    lung ca 11/2020    ALLERGIES:  has No Known Allergies.  MEDICATIONS:  Current Outpatient Medications  Medication Sig  Dispense Refill   amLODipine (NORVASC) 10 MG tablet Take 10 mg by mouth daily.     diclofenac Sodium (VOLTAREN) 1 % GEL APPLY 2 GRAMS TO THE AFFECTED AREA(S) BY TOPICAL ROUTE 2 TIMES PER DAY     gabapentin (NEURONTIN) 250 MG/5ML solution Take 3 mLs (150 mg total) by mouth at bedtime. 90 mL 2   hydrochlorothiazide (MICROZIDE) 12.5 MG capsule Take 12.5 mg by mouth daily.     labetalol (NORMODYNE) 200 MG tablet Take 200 mg by mouth 2 (two) times daily.     levothyroxine (SYNTHROID) 150 MCG tablet Take 150 mcg by mouth daily.     loperamide (IMODIUM) 2 MG capsule Take by mouth as needed for diarrhea or loose stools.     naproxen (NAPROSYN) 500 MG tablet Take 500 mg by mouth 2 (two) times daily as needed for moderate pain.     oxyCODONE-acetaminophen (PERCOCET/ROXICET) 5-325 MG tablet Take 1 tablet by mouth every 8 (eight) hours as needed for severe pain. 30 tablet 0   prochlorperazine (COMPAZINE) 10 MG tablet Take 1 tablet (10 mg total) by mouth every 6 (six) hours as needed for nausea or vomiting. 30 tablet 2   tadalafil (CIALIS) 5 MG tablet Take 5 mg by mouth daily.     tamsulosin (FLOMAX) 0.4 MG CAPS capsule Take 0.4 mg by mouth daily.     No current facility-administered medications for this visit.    SURGICAL HISTORY:  Past Surgical History:  Procedure Laterality Date   COLONOSCOPY N/A 09/02/2016   Procedure: COLONOSCOPY;  Surgeon: [name], MD;  Location: [facility];  Service: Endoscopy;  Laterality: N/A;  830   NO PAST SURGERIES     POLYPECTOMY  09/02/2016   Procedure: POLYPECTOMY;  Surgeon: [name], MD;  Location: [facility];  Service: Endoscopy;;    REVIEW OF SYSTEMS:  A comprehensive review of systems was negative.   PHYSICAL EXAMINATION: General appearance: alert, cooperative, and no distress Head: Normocephalic, without obvious abnormality, atraumatic Neck: no adenopathy, no JVD, supple, symmetrical, trachea midline, and thyroid not enlarged, symmetric, no  tenderness/mass/nodules Lymph nodes: Cervical, supraclavicular, and axillary nodes normal. Resp: clear to auscultation bilaterally Back: symmetric, no curvature. ROM normal. No CVA tenderness. Cardio: regular rate and rhythm, S1, S2 normal, no murmur, click, rub or gallop GI: soft, non-tender; bowel sounds normal; no masses,  no organomegaly Extremities: extremities normal, atraumatic, no cyanosis or edema  ECOG PERFORMANCE STATUS: 1 - Symptomatic but completely ambulatory  Blood pressure 104/66, pulse 64, temperature 99.9 F (37.7 C), temperature source Oral, resp. rate 16, weight 151 lb (68.5 kg), SpO2 93 %.  LABORATORY DATA: Lab Results  Component Value Date   WBC 4.5 01/11/2023   HGB 9.9 (L) 01/11/2023   HCT 30.5 (L) 01/11/2023   MCV 92.1 01/11/2023   PLT 193 01/11/2023      Chemistry      Component Value Date/Time   NA 135 12/22/2022 1043   K 4.2 12/22/2022 1043   CL 106 12/22/2022 1043   CO2 23 12/22/2022 1043   BUN 35 (H) 12/22/2022 1043   CREATININE 2.83 (H) 12/22/2022 1043      Component Value Date/Time   CALCIUM 8.5 (L) 12/22/2022 1043   ALKPHOS 63 12/22/2022 1043   AST 9 (L) 12/22/2022 1043   ALT 6 12/22/2022 1043   BILITOT 0.4 12/22/2022 1043       RADIOGRAPHIC STUDIES: No results found.  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  The patient underwent a course of concurrent chemoradiation with weekly carboplatin for AUC of 2 and paclitaxel 45 Mg/M2 status post 6 cycles. He tolerated the previous course of his concurrent chemoradiation fairly well except for fatigue. Unfortunately  his CT scan of the chest after the induction phase showed interval enlargement of a large mass centered at the periphery of the right upper lobe and extending into the adjacent chest wall measuring 8.4 x 6.7 cm with bony destruction of the right third and fourth ribs substantially increased compared to the prior examination and the findings are consistent with worsened malignancy. The patient has PD-L1 expression of 90% and I felt he will be a good candidate for treatment with first-line single agent immunotherapy. I recommended for him treatment with Libtayo (Cempilimab) 350 Mg IV every 3 weeks.  He is status post 31 cycles of treatment.   The patient has been tolerating this treatment fairly well with no concerning adverse effect except for the vitiligo. I recommended for him to proceed with cycle #32 today as planned. I will see him back for follow-up visit in 3 weeks for evaluation with repeat CT scan of the chest, abdomen and pelvis for restaging of his disease. For the hypothyroidism, the patient will continue his current treatment with levothyroxine.  The patient was advised to call immediately if he has any concerning symptoms in the interval. The patient voices understanding of current disease status and treatment options and is in agreement with the current care plan.  All questions were answered. The patient knows to call the clinic with any problems, questions or concerns. We can certainly see the patient much sooner if necessary.   Disclaimer: This note was dictated with voice recognition software. Similar sounding words can inadvertently be transcribed and may not be corrected upon review.

## 2023-01-11 NOTE — Progress Notes (Signed)
Per Dr  Arbutus Ped it is ok to treat pt today with Libtayo and creatinine of 3.53.

## 2023-01-12 LAB — T4: T4, Total: 7.9 ug/dL (ref 4.5–12.0)

## 2023-01-16 ENCOUNTER — Other Ambulatory Visit: Payer: Self-pay

## 2023-01-19 ENCOUNTER — Telehealth: Payer: Self-pay | Admitting: Internal Medicine

## 2023-01-19 NOTE — Telephone Encounter (Signed)
Called patient regarding upcoming May and June appointments, patient is notified. ?

## 2023-01-27 ENCOUNTER — Ambulatory Visit (HOSPITAL_COMMUNITY)
Admission: RE | Admit: 2023-01-27 | Discharge: 2023-01-27 | Disposition: A | Discharge status: Home / Self Care | Payer: Medicare HMO | Source: Ambulatory Visit | Attending: Internal Medicine | Admitting: Internal Medicine

## 2023-01-27 ENCOUNTER — Encounter (HOSPITAL_COMMUNITY): Payer: Self-pay

## 2023-01-27 DIAGNOSIS — J479 Bronchiectasis, uncomplicated: Secondary | ICD-10-CM | POA: Diagnosis not present

## 2023-01-27 DIAGNOSIS — N281 Cyst of kidney, acquired: Secondary | ICD-10-CM | POA: Diagnosis not present

## 2023-01-27 DIAGNOSIS — C3490 Malignant neoplasm of unspecified part of unspecified bronchus or lung: Secondary | ICD-10-CM | POA: Insufficient documentation

## 2023-01-27 DIAGNOSIS — J432 Centrilobular emphysema: Secondary | ICD-10-CM | POA: Diagnosis not present

## 2023-02-01 ENCOUNTER — Inpatient Hospital Stay: Payer: Medicare HMO

## 2023-02-01 ENCOUNTER — Inpatient Hospital Stay: Payer: Medicare HMO | Admitting: Internal Medicine

## 2023-02-16 NOTE — Progress Notes (Signed)
[facility] OFFICE PROGRESS NOTE  [name], MD [address] 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   Biomarker Findings Tumor Mutational Burden - 10 Muts/Mb Microsatellite status - MS-Stable Genomic Findings For a complete list of the genes assayed, please refer to the Appendix. KRAS G12C NFKBIA amplification NKX2-1 amplification RBM10 D63fs*80 7 Disease relevant genes with no reportable alterations: ALK, BRAF, EGFR, ERBB2, MET, RET, ROS1   PDL1: 90%  PRIOR THERAPY:  1) Weekly concurrent chemoradiation with carboplatin for an AUC of 2 and paclitaxel 45 mg/m.  First dose on 12/30/2020.  Status post 6 cycles    2) First-line treatment with immunotherapy with Libtayo (Cempilimab) 350 Mg IV every 3 weeks. First dose on 03/19/21. Status post 32 cycles.  Discontinued due to completing almost 2 years of intended treatment and having worsening CKD.   CURRENT THERAPY: Observation   INTERVAL HISTORY: [name] 89 y.o. male returns to the clinic today for a follow-up visit accompanied by his son-in-law. The patient is feeling fairly well today without any concern complaints. He was supposed to come in for treatment on 5/6 but they called to cancel appointment due to him having increased lower extremity swelling bilaterally. He sees Dr. Arrie Aran at [facility] in [place]. The patient denies any new medications, dehydration (N/V/D), or NSAIDs. He states he eats and drinks appropriately and had not noticed changes with his urination.   He is tolerating his treatment well except he developed immunotherapy mediated vitiligo. He reports stable fatigue. He reports dyspnea on exertion  which is stable.  He denies significant cough. Denies any chest pain or hemoptysis. Denies any headache or visual changes. He denies nausea, vomiting, diarrhea, or constipation. He is here for evaluation and repeat blood work before starting cycle #33.    MEDICAL HISTORY: Past Medical History:  Diagnosis Date   Arthritis    Hypertension    Hyperthyroidism    lung ca 11/2020    ALLERGIES:  has No Known Allergies.  MEDICATIONS:  Current Outpatient Medications  Medication Sig Dispense Refill   amLODipine (NORVASC) 10 MG tablet Take 10 mg by mouth daily.     diclofenac Sodium (VOLTAREN) 1 % GEL APPLY 2 GRAMS TO THE AFFECTED AREA(S) BY TOPICAL ROUTE 2 TIMES PER DAY     gabapentin (NEURONTIN) 250 MG/5ML solution Take 3 mLs (150 mg total) by mouth at bedtime. 90 mL 2   hydrochlorothiazide (MICROZIDE) 12.5 MG capsule Take 12.5 mg by mouth daily.     labetalol (NORMODYNE) 200 MG tablet Take 200 mg by mouth 2 (two) times daily.     levothyroxine (SYNTHROID) 150 MCG tablet Take 150 mcg by mouth daily.     loperamide (IMODIUM) 2 MG capsule Take by mouth as needed for diarrhea or loose stools.     naproxen (NAPROSYN) 500 MG tablet Take 500 mg by mouth 2 (two) times daily as needed for moderate pain.     oxyCODONE-acetaminophen (PERCOCET/ROXICET) 5-325 MG tablet Take 1 tablet by mouth every 8 (eight) hours as needed for severe pain. 30 tablet 0   prochlorperazine (COMPAZINE) 10 MG tablet Take 1 tablet (10 mg total) by mouth every 6 (six) hours as needed for nausea or vomiting. 30 tablet  2   tadalafil (CIALIS) 5 MG tablet Take 5 mg by mouth daily.     tamsulosin (FLOMAX) 0.4 MG CAPS capsule Take 0.4 mg by mouth daily.     No current facility-administered medications for this visit.    SURGICAL HISTORY:  Past Surgical History:  Procedure Laterality Date   COLONOSCOPY N/A 09/02/2016   Procedure: COLONOSCOPY;  Surgeon: [name], MD;  Location: [facility];  Service: Endoscopy;  Laterality:  N/A;  830   NO PAST SURGERIES     POLYPECTOMY  09/02/2016   Procedure: POLYPECTOMY;  Surgeon: [name], MD;  Location: [facility];  Service: Endoscopy;;    REVIEW OF SYSTEMS:   Constitutional: Positive for stable fatigue. Negative for appetite change, chills, fever and unexpected weight change.  HENT: Negative for mouth sores, nosebleeds, sore throat and trouble swallowing.  Eyes: Negative for eye problems and icterus.  Respiratory: Positive for stable shortness of breath with exertion. Negative for hemoptysis, cough and wheezing.  Cardiovascular: Negative for chest pain. Positive for mild-moderate bilateral lower extremity swelling.  Gastrointestinal: Negative for abdominal pain, constipation, diarrhea, nausea and vomiting.  Genitourinary: Negative for bladder incontinence, difficulty urinating, dysuria, frequency and hematuria.   Musculoskeletal: Negative for back pain, gait problem, neck pain and neck stiffness.  Skin: Positive for hypopigmentation on face/vitiligo .  Neurological: Negative for dizziness, extremity weakness, gait problem, headaches, light-headedness and seizures.  Hematological: Negative for adenopathy. Does not bruise/bleed easily.  Psychiatric/Behavioral: Negative for confusion, depression and sleep disturbance. The patient is not nervous/anxious.    PHYSICAL EXAMINATION:  Blood pressure 120/67, pulse 71, temperature (!) 97.2 F (36.2 C), temperature source Tympanic, resp. rate 14, weight 143 lb 14.4 oz (65.3 kg), SpO2 95 %.  ECOG PERFORMANCE STATUS: 1-2  Physical Exam  Constitutional: Oriented to person, place, and time and thin appearing male and in no distress.  HENT:  Head: Normocephalic and atraumatic.  Mouth/Throat: Oropharynx is clear and moist. No oropharyngeal exudate.  No evidence of thrush Eyes: Conjunctivae are normal. Right eye exhibits no discharge. Left eye exhibits no discharge. No scleral icterus.  Neck: Normal range of motion. Neck  supple.  Cardiovascular: Normal rate, regular rhythm, normal heart sounds and intact distal pulses.   Pulmonary/Chest: Effort normal and breath sounds normal. No respiratory distress. No wheezes. No rales.  Abdominal: Soft. Bowel sounds are normal. Exhibits no distension and no mass. There is no tenderness.  Musculoskeletal: Normal range of motion. Positive for mild-moderate bilateral lower extremity swelling.  Lymphadenopathy:    No cervical adenopathy.  Neurological: Alert and oriented to person, place, and time. Exhibits muscle wasting.  The patient was examined in the wheelchair. Skin: Hypopigmentation on his face and upper torso and hands. Skin is warm and dry. Not diaphoretic. No erythema. No pallor.  Psychiatric: Mood, memory and judgment normal.  Vitals reviewed.  LABORATORY DATA: Lab Results  Component Value Date   WBC 3.8 (L) 02/23/2023   HGB 9.9 (L) 02/23/2023   HCT 29.9 (L) 02/23/2023   MCV 91.2 02/23/2023   PLT 190 02/23/2023      Chemistry      Component Value Date/Time   NA 137 02/23/2023 1005   K 4.2 02/23/2023 1005   CL 107 02/23/2023 1005   CO2 23 02/23/2023 1005   BUN 68 (H) 02/23/2023 1005   CREATININE 4.65 (H) 02/23/2023 1005      Component Value Date/Time   CALCIUM 8.2 (L) 02/23/2023 1005   ALKPHOS 63 02/23/2023 1005  AST 7 (L) 02/23/2023 1005   ALT 6 02/23/2023 1005   BILITOT 0.3 02/23/2023 1005       RADIOGRAPHIC STUDIES:  CT Chest Wo Contrast  Result Date: 01/31/2023 CLINICAL DATA:  Restaging non-small cell right lung cancer with ongoing chemoimmunotherapy. * Tracking Code: BO * EXAM: CT CHEST, ABDOMEN AND PELVIS WITHOUT CONTRAST TECHNIQUE: Multidetector CT imaging of the chest, abdomen and pelvis was performed following the standard protocol without IV contrast. RADIATION DOSE REDUCTION: This exam was performed according to the departmental dose-optimization program which includes automated exposure control, adjustment of the mA and/or kV  according to patient size and/or use of iterative reconstruction technique. COMPARISON:  11/05/2022 CT chest, abdomen and pelvis. FINDINGS: CT CHEST FINDINGS Cardiovascular: Normal heart size. No significant pericardial effusion/thickening. Three-vessel coronary atherosclerosis. Atherosclerotic nonaneurysmal thoracic aorta. Dilated main pulmonary artery (3.6 cm diameter). Mediastinum/Nodes: Stable multinodular goiter with dominant hypodense 2.0 cm left thyroid isthmus nodule. Unremarkable esophagus. No axillary adenopathy. Mildly enlarged 1.1 cm AP window node (series 2/image 24), unchanged. No new pathologically enlarged mediastinal nodes. No discrete hilar adenopathy on these noncontrast images. Lungs/Pleura: No pneumothorax. No pleural effusion. Severe centrilobular emphysema with diffuse bronchial wall thickening. Sharply marginated geographic region of dense consolidation in the perihilar and peripheral right upper lobe with associated bronchiectasis, volume loss and distortion, unchanged and compatible with radiation fibrosis. No acute consolidative airspace disease or new significant pulmonary nodules. Stable tiny calcified 2 mm peripheral right middle lobe granuloma. Musculoskeletal: Chronic lytic destructive change in anterolateral right third rib, unchanged. Chronic healed deformity in the lateral right fourth rib. No new focal osseous lesions in the chest. Mild thoracic spondylosis. Symmetric mild bilateral gynecomastia, unchanged. CT ABDOMEN PELVIS FINDINGS Hepatobiliary: Normal liver with no liver mass. Normal gallbladder with no radiopaque cholelithiasis. No biliary ductal dilatation. Pancreas: Normal, with no mass or duct dilation. Spleen: Normal size spleen. Hypodense indistinct 2.1 cm peripheral splenic lesion (series 2/image 56), unchanged back to at least 11/25/2020 PET-CT where it was non hypermetabolic, presumably benign. No new splenic lesions. Adrenals/Urinary Tract: Normal adrenals. No renal  stones. No hydronephrosis. Simple bilateral renal cysts, largest 3.1 cm in the posterior interpolar left kidney, for which no follow-up imaging is recommended. Chronic prominent diffuse bladder wall thickening, not definitely changed. Stomach/Bowel: Normal non-distended stomach. Normal caliber small bowel with no small bowel wall thickening. Appendix not discretely visualized. Scattered mild colonic diverticulosis, with no large bowel wall thickening or acute pericolonic fat stranding. Vascular/Lymphatic: Atherosclerotic nonaneurysmal abdominal aorta. No pathologically enlarged lymph nodes in the abdomen or pelvis. Reproductive: Moderate prostatomegaly. Fiducial markers noted at the periphery of the prostate, unchanged. Other: No pneumoperitoneum, ascites or focal fluid collection. Musculoskeletal: No aggressive appearing focal osseous lesions. Marked lumbar degenerative disc disease, most prominent at L5-S1. IMPRESSION: 1. Stable CT examination with no evidence of recurrent metastatic disease in the chest, abdomen or pelvis. 2. Stable radiation fibrosis in the perihilar and peripheral right upper lobe with no evidence of local tumor recurrence. Chronic lytic destructive change in the anterolateral right third rib, unchanged. 3. Stable mild AP window lymphadenopathy, favor reactive. 4. Chronic findings include: Dilated main pulmonary artery, suggesting pulmonary arterial hypertension. Three-vessel coronary atherosclerosis. Moderate prostatomegaly. Chronic prominent diffuse bladder wall thickening, nonspecific, probably due to chronic bladder outlet obstruction. Aortic Atherosclerosis (ICD10-I70.0) and Emphysema (ICD10-J43.9). Electronically Signed   By: Delbert Phenix M.D.   On: 01/31/2023 23:16   CT Abdomen Pelvis Wo Contrast  Result Date: 01/31/2023 CLINICAL DATA:  Restaging non-small cell right lung cancer  with ongoing chemoimmunotherapy. * Tracking Code: BO * EXAM: CT CHEST, ABDOMEN AND PELVIS WITHOUT CONTRAST  TECHNIQUE: Multidetector CT imaging of the chest, abdomen and pelvis was performed following the standard protocol without IV contrast. RADIATION DOSE REDUCTION: This exam was performed according to the departmental dose-optimization program which includes automated exposure control, adjustment of the mA and/or kV according to patient size and/or use of iterative reconstruction technique. COMPARISON:  11/05/2022 CT chest, abdomen and pelvis. FINDINGS: CT CHEST FINDINGS Cardiovascular: Normal heart size. No significant pericardial effusion/thickening. Three-vessel coronary atherosclerosis. Atherosclerotic nonaneurysmal thoracic aorta. Dilated main pulmonary artery (3.6 cm diameter). Mediastinum/Nodes: Stable multinodular goiter with dominant hypodense 2.0 cm left thyroid isthmus nodule. Unremarkable esophagus. No axillary adenopathy. Mildly enlarged 1.1 cm AP window node (series 2/image 24), unchanged. No new pathologically enlarged mediastinal nodes. No discrete hilar adenopathy on these noncontrast images. Lungs/Pleura: No pneumothorax. No pleural effusion. Severe centrilobular emphysema with diffuse bronchial wall thickening. Sharply marginated geographic region of dense consolidation in the perihilar and peripheral right upper lobe with associated bronchiectasis, volume loss and distortion, unchanged and compatible with radiation fibrosis. No acute consolidative airspace disease or new significant pulmonary nodules. Stable tiny calcified 2 mm peripheral right middle lobe granuloma. Musculoskeletal: Chronic lytic destructive change in anterolateral right third rib, unchanged. Chronic healed deformity in the lateral right fourth rib. No new focal osseous lesions in the chest. Mild thoracic spondylosis. Symmetric mild bilateral gynecomastia, unchanged. CT ABDOMEN PELVIS FINDINGS Hepatobiliary: Normal liver with no liver mass. Normal gallbladder with no radiopaque cholelithiasis. No biliary ductal dilatation.  Pancreas: Normal, with no mass or duct dilation. Spleen: Normal size spleen. Hypodense indistinct 2.1 cm peripheral splenic lesion (series 2/image 56), unchanged back to at least 11/25/2020 PET-CT where it was non hypermetabolic, presumably benign. No new splenic lesions. Adrenals/Urinary Tract: Normal adrenals. No renal stones. No hydronephrosis. Simple bilateral renal cysts, largest 3.1 cm in the posterior interpolar left kidney, for which no follow-up imaging is recommended. Chronic prominent diffuse bladder wall thickening, not definitely changed. Stomach/Bowel: Normal non-distended stomach. Normal caliber small bowel with no small bowel wall thickening. Appendix not discretely visualized. Scattered mild colonic diverticulosis, with no large bowel wall thickening or acute pericolonic fat stranding. Vascular/Lymphatic: Atherosclerotic nonaneurysmal abdominal aorta. No pathologically enlarged lymph nodes in the abdomen or pelvis. Reproductive: Moderate prostatomegaly. Fiducial markers noted at the periphery of the prostate, unchanged. Other: No pneumoperitoneum, ascites or focal fluid collection. Musculoskeletal: No aggressive appearing focal osseous lesions. Marked lumbar degenerative disc disease, most prominent at L5-S1. IMPRESSION: 1. Stable CT examination with no evidence of recurrent metastatic disease in the chest, abdomen or pelvis. 2. Stable radiation fibrosis in the perihilar and peripheral right upper lobe with no evidence of local tumor recurrence. Chronic lytic destructive change in the anterolateral right third rib, unchanged. 3. Stable mild AP window lymphadenopathy, favor reactive. 4. Chronic findings include: Dilated main pulmonary artery, suggesting pulmonary arterial hypertension. Three-vessel coronary atherosclerosis. Moderate prostatomegaly. Chronic prominent diffuse bladder wall thickening, nonspecific, probably due to chronic bladder outlet obstruction. Aortic Atherosclerosis (ICD10-I70.0)  and Emphysema (ICD10-J43.9). Electronically Signed   By: Delbert Phenix M.D.   On: 01/31/2023 23:16     ASSESSMENT/PLAN:  This is a very pleasant 89 year old African-American male diagnosed with a stage IVa (T4, N3, M1 B) non-small cell lung cancer, adenosquamous carcinoma in March 2022 and presented with large mass involving the lateral right upper lobe and right chest wall soft tissue in addition to right hilar, mediastinal and right subpectoral and supraclavicular  lymphadenopathy with PD-L1 expression of 90%.   The patient is status post a course of concurrent chemoradiation with carboplatin for an AUC of 2 and paclitaxel 45 mg per metered square.  He is status post 6 cycles.    The patient had evidence of disease progression following concurrent chemoradiation.   The patient is currently undergoing single agent immunotherapy with Libtayo IV every 3 weeks due to his PD-L1 expression of 90%.  The patient is status post 32 cycles.  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  He was seen by Dr. Jorja Loa by dermatology.  The patient recently had a restaging CT scan. Dr. Arbutus Ped personally and independently reviewed the scan and discussed the results with the patient. The scan showed no evidence of disease progression.     Labs were reviewed. The patient has baseline CKD but his creatinine is worse today at 4.65 today.  Given that the patient completed almost all of his two years of treatment and worsening creatinine, Dr. Arbutus Ped recommends discontinuing his treatment.   We will reach out to his nephrologists office about his creatinine to make a follow up appointment in the near future. The patient denies new medications, NSAIDs, or recent dehydration (N/V/D).   Dr. Arbutus Ped recommends arranging for a restaging CT scan in 2 months from now (3 months from his last scan). We will see him a few days later to review the results. For now, he will  elevate his legs, avoid salty food, and use compression stockings.    We have previously had a discussion about his vitiligo and his immunotherapy and the decision to continue. Given his age and CKD, alternative treatments would likely be challenging for this patient. Therefore, he opted to continue on his current treatment. Of note, he does have KRAS G12C mutation which can be used in the second line setting in the future   The patient was advised to call immediately if he has any concerning symptoms in the interval. The patient voices understanding of current disease status and treatment options and is in agreement with the current care plan. All questions were answered. The patient knows to call the clinic with any problems, questions or concerns. We can certainly see the patient much sooner if necessary      Orders Placed This Encounter  Procedures   CT CHEST ABDOMEN PELVIS WO CONTRAST    Standing Status:   Future    Standing Expiration Date:   02/23/2024    Order Specific Question:   Preferred imaging location?    Answer:   [facility]    Order Specific Question:   If indicated for the ordered procedure, I authorize the administration of oral contrast media per Radiology protocol    Answer:   Yes    Order Specific Question:   Does the patient have a contrast media/X-ray dye allergy?    Answer:   No   CBC with Differential (Cancer Center Only)    Standing Status:   Future    Standing Expiration Date:   02/23/2024   CMP (Cancer Center only)    Standing Status:   Future    Standing Expiration Date:   02/23/2024      Johnette Abraham Pollyann Roa, PA-C 02/23/23  ADDENDUM: Hematology/Oncology Attending: I had a face-to-face encounter with the patient today.  I reviewed his record, lab, scan and recommended his care plan.  This is a very pleasant 89 years old African-American male  with stage IV non-small cell lung cancer, adenosquamous carcinoma diagnosed in March 2022 with  positive KRAS G12C mutation and PD-L1 expression of 90%. The patient is status post a course of concurrent chemoradiation to the locally advanced disease in the chest.  He was then treated with immunotherapy with Libtayo (Cempilimab) 350 Mg IV every 3 weeks since June 2022 status post 32 cycles.  He has been tolerating this treatment well except for the immunotherapy mediated vitiligo. The patient has worsening renal function recently. He had repeat CT scan of the chest, abdomen and pelvis for restaging of his disease few weeks ago. I personally and independently reviewed the scan and discussed the result with the patient and his son-in-law. His scan showed no concerning findings for disease progression. I recommended for the patient to discontinue his treatment with Libtayo (Cempilimab) at this point because he almost completed 2 years of treatment and he also has worsening renal function. The patient is in agreement with the current plan. He will continue on observation with repeat CT scan of the chest, abdomen and pelvis in 3 months. He was advised to call immediately if he has any other concerning symptoms in the interval. The total time spent in the appointment was 30 minutes.  Disclaimer: This note was dictated with voice recognition software. Similar sounding words can inadvertently be transcribed and may be missed upon review. Lajuana Matte, MD

## 2023-02-18 ENCOUNTER — Encounter: Payer: Self-pay | Admitting: Internal Medicine

## 2023-02-23 ENCOUNTER — Inpatient Hospital Stay: Payer: Medicare HMO | Attending: Physician Assistant

## 2023-02-23 ENCOUNTER — Inpatient Hospital Stay: Payer: Medicare HMO

## 2023-02-23 ENCOUNTER — Inpatient Hospital Stay (HOSPITAL_BASED_OUTPATIENT_CLINIC_OR_DEPARTMENT_OTHER): Payer: Medicare HMO | Admitting: Physician Assistant

## 2023-02-23 ENCOUNTER — Encounter: Payer: Self-pay | Admitting: Internal Medicine

## 2023-02-23 VITALS — BP 120/67 | HR 71 | Temp 97.2°F | Resp 14 | Wt 143.9 lb

## 2023-02-23 DIAGNOSIS — L80 Vitiligo: Secondary | ICD-10-CM | POA: Diagnosis not present

## 2023-02-23 DIAGNOSIS — T451X5A Adverse effect of antineoplastic and immunosuppressive drugs, initial encounter: Secondary | ICD-10-CM | POA: Diagnosis not present

## 2023-02-23 DIAGNOSIS — C3411 Malignant neoplasm of upper lobe, right bronchus or lung: Secondary | ICD-10-CM | POA: Insufficient documentation

## 2023-02-23 DIAGNOSIS — Z79899 Other long term (current) drug therapy: Secondary | ICD-10-CM | POA: Insufficient documentation

## 2023-02-23 DIAGNOSIS — N189 Chronic kidney disease, unspecified: Secondary | ICD-10-CM | POA: Insufficient documentation

## 2023-02-23 LAB — CBC WITH DIFFERENTIAL (CANCER CENTER ONLY)
Abs Immature Granulocytes: 0.01 10*3/uL (ref 0.00–0.07)
Basophils Absolute: 0 10*3/uL (ref 0.0–0.1)
Basophils Relative: 0 %
Eosinophils Absolute: 0.2 10*3/uL (ref 0.0–0.5)
Eosinophils Relative: 4 %
HCT: 29.9 % — ABNORMAL LOW (ref 39.0–52.0)
Hemoglobin: 9.9 g/dL — ABNORMAL LOW (ref 13.0–17.0)
Immature Granulocytes: 0 %
Lymphocytes Relative: 45 %
Lymphs Abs: 1.7 10*3/uL (ref 0.7–4.0)
MCH: 30.2 pg (ref 26.0–34.0)
MCHC: 33.1 g/dL (ref 30.0–36.0)
MCV: 91.2 fL (ref 80.0–100.0)
Monocytes Absolute: 0.3 10*3/uL (ref 0.1–1.0)
Monocytes Relative: 9 %
Neutro Abs: 1.6 10*3/uL — ABNORMAL LOW (ref 1.7–7.7)
Neutrophils Relative %: 42 %
Platelet Count: 190 10*3/uL (ref 150–400)
RBC: 3.28 MIL/uL — ABNORMAL LOW (ref 4.22–5.81)
RDW: 14.8 % (ref 11.5–15.5)
WBC Count: 3.8 10*3/uL — ABNORMAL LOW (ref 4.0–10.5)
nRBC: 0 % (ref 0.0–0.2)

## 2023-02-23 LAB — CMP (CANCER CENTER ONLY)
ALT: 6 U/L (ref 0–44)
AST: 7 U/L — ABNORMAL LOW (ref 15–41)
Albumin: 3 g/dL — ABNORMAL LOW (ref 3.5–5.0)
Alkaline Phosphatase: 63 U/L (ref 38–126)
Anion gap: 7 (ref 5–15)
BUN: 68 mg/dL — ABNORMAL HIGH (ref 8–23)
CO2: 23 mmol/L (ref 22–32)
Calcium: 8.2 mg/dL — ABNORMAL LOW (ref 8.9–10.3)
Chloride: 107 mmol/L (ref 98–111)
Creatinine: 4.65 mg/dL — ABNORMAL HIGH (ref 0.61–1.24)
GFR, Estimated: 12 mL/min — ABNORMAL LOW (ref >60)
Glucose, Bld: 109 mg/dL — ABNORMAL HIGH (ref 70–99)
Potassium: 4.2 mmol/L (ref 3.5–5.1)
Sodium: 137 mmol/L (ref 135–145)
Total Bilirubin: 0.3 mg/dL (ref 0.3–1.2)
Total Protein: 7.3 g/dL (ref 6.5–8.1)

## 2023-02-23 LAB — TSH: TSH: 3.119 u[IU]/mL (ref 0.350–4.500)

## 2023-02-24 DIAGNOSIS — M170 Bilateral primary osteoarthritis of knee: Secondary | ICD-10-CM | POA: Diagnosis not present

## 2023-02-24 LAB — T4: T4, Total: 7.5 ug/dL (ref 4.5–12.0)

## 2023-03-01 ENCOUNTER — Other Ambulatory Visit (HOSPITAL_COMMUNITY): Payer: Self-pay | Admitting: Nephrology

## 2023-03-01 DIAGNOSIS — C3411 Malignant neoplasm of upper lobe, right bronchus or lung: Secondary | ICD-10-CM | POA: Diagnosis not present

## 2023-03-01 DIAGNOSIS — D631 Anemia in chronic kidney disease: Secondary | ICD-10-CM | POA: Diagnosis not present

## 2023-03-01 DIAGNOSIS — N184 Chronic kidney disease, stage 4 (severe): Secondary | ICD-10-CM | POA: Diagnosis not present

## 2023-03-01 DIAGNOSIS — R634 Abnormal weight loss: Secondary | ICD-10-CM | POA: Diagnosis not present

## 2023-03-01 DIAGNOSIS — I129 Hypertensive chronic kidney disease with stage 1 through stage 4 chronic kidney disease, or unspecified chronic kidney disease: Secondary | ICD-10-CM | POA: Diagnosis not present

## 2023-03-01 DIAGNOSIS — N400 Enlarged prostate without lower urinary tract symptoms: Secondary | ICD-10-CM | POA: Diagnosis not present

## 2023-03-02 ENCOUNTER — Ambulatory Visit (HOSPITAL_COMMUNITY)
Admission: RE | Admit: 2023-03-02 | Discharge: 2023-03-02 | Disposition: A | Discharge status: Home / Self Care | Payer: Medicare HMO | Source: Ambulatory Visit | Attending: Nephrology | Admitting: Nephrology

## 2023-03-02 DIAGNOSIS — N184 Chronic kidney disease, stage 4 (severe): Secondary | ICD-10-CM

## 2023-03-02 DIAGNOSIS — N281 Cyst of kidney, acquired: Secondary | ICD-10-CM | POA: Diagnosis not present

## 2023-03-08 ENCOUNTER — Other Ambulatory Visit (HOSPITAL_COMMUNITY): Payer: Self-pay | Admitting: Nephrology

## 2023-03-08 DIAGNOSIS — N184 Chronic kidney disease, stage 4 (severe): Secondary | ICD-10-CM

## 2023-03-15 ENCOUNTER — Other Ambulatory Visit: Payer: Medicare HMO

## 2023-03-15 ENCOUNTER — Ambulatory Visit: Payer: Medicare HMO | Admitting: Internal Medicine

## 2023-03-15 ENCOUNTER — Ambulatory Visit: Payer: Medicare HMO

## 2023-03-19 ENCOUNTER — Ambulatory Visit (HOSPITAL_COMMUNITY)
Admission: RE | Admit: 2023-03-19 | Discharge: 2023-03-19 | Disposition: A | Discharge status: Home / Self Care | Payer: Medicare HMO | Source: Ambulatory Visit | Attending: Nephrology | Admitting: Nephrology

## 2023-03-19 DIAGNOSIS — N281 Cyst of kidney, acquired: Secondary | ICD-10-CM | POA: Diagnosis not present

## 2023-03-19 DIAGNOSIS — N184 Chronic kidney disease, stage 4 (severe): Secondary | ICD-10-CM

## 2023-03-19 DIAGNOSIS — N189 Chronic kidney disease, unspecified: Secondary | ICD-10-CM | POA: Diagnosis not present

## 2023-03-29 DIAGNOSIS — N184 Chronic kidney disease, stage 4 (severe): Secondary | ICD-10-CM | POA: Diagnosis not present

## 2023-03-29 DIAGNOSIS — I129 Hypertensive chronic kidney disease with stage 1 through stage 4 chronic kidney disease, or unspecified chronic kidney disease: Secondary | ICD-10-CM | POA: Diagnosis not present

## 2023-03-29 DIAGNOSIS — D631 Anemia in chronic kidney disease: Secondary | ICD-10-CM | POA: Diagnosis not present

## 2023-03-30 DIAGNOSIS — N184 Chronic kidney disease, stage 4 (severe): Secondary | ICD-10-CM | POA: Diagnosis not present

## 2023-03-30 DIAGNOSIS — N390 Urinary tract infection, site not specified: Secondary | ICD-10-CM | POA: Diagnosis not present

## 2023-04-06 ENCOUNTER — Ambulatory Visit (INDEPENDENT_AMBULATORY_CARE_PROVIDER_SITE_OTHER): Payer: Medicare HMO | Admitting: Urology

## 2023-04-06 ENCOUNTER — Encounter: Payer: Self-pay | Admitting: Urology

## 2023-04-06 VITALS — BP 131/74 | HR 86 | Temp 97.9°F

## 2023-04-06 DIAGNOSIS — R369 Urethral discharge, unspecified: Secondary | ICD-10-CM

## 2023-04-06 DIAGNOSIS — N4889 Other specified disorders of penis: Secondary | ICD-10-CM

## 2023-04-06 DIAGNOSIS — L80 Vitiligo: Secondary | ICD-10-CM

## 2023-04-06 DIAGNOSIS — C61 Malignant neoplasm of prostate: Secondary | ICD-10-CM

## 2023-04-06 DIAGNOSIS — L819 Disorder of pigmentation, unspecified: Secondary | ICD-10-CM

## 2023-04-06 LAB — BLADDER SCAN AMB NON-IMAGING: Scan Result: 141

## 2023-04-06 MED ORDER — CLOBETASOL PROPIONATE 0.05 % EX CREA
1.0000 | TOPICAL_CREAM | Freq: Two times a day (BID) | CUTANEOUS | 0 refills | Status: DC
Start: 2023-04-06 — End: 2023-07-07

## 2023-04-06 NOTE — Progress Notes (Addendum)
Name: [name] DOB: 27-Aug-1933 MRN: [id number]  History of Present Illness: [name] is a 89 y.o. male who presents today as a new patient at [facility]. All available relevant medical records have been reviewed. He is accompanied by his daughter [name], son-in-law, and grandson. - GU History: 1. Prostate cancer. - 07/24/2015: Prostate biopsy by Dr. Ronne [name] (pathology positive for prostate adenocarcinoma). - He had gold seed placement on 11/27/2015 at [facility] prior to IMRT, which he did in [place].  - 01/27/2023: CT abdomen/pelvis w/o contrast showed "Moderate prostatomegaly. Fiducial markers noted at the periphery of the prostate, unchanged." - No PSA values within past 10 years found per chart review today; will request relevant records from PCP (Dr. Margo [name]). 2. Bilateral renal cysts. 3. CKD stage 4. Followed by Dr. Arrie Aran at [facility] in [place].  4. Erectile dysfunction.   He reports intermittent bleeding from the skin of his penis along with occasional white discharge from his urethral orifice. He denies penile pain, redness, swelling, tenderness. Does report that sometimes it feels tight around the head of his penis.   He denies increased urinary urgency, frequency, incontinence, dysuria, gross hematuria, straining to void, or sensations of incomplete emptying. He denies history of UTIs.   He denies abdominal or flank pain. He denies fevers. Wears Depends due to fecal incontinence.  His oncology provider ([name], PA) ordered CT chest/abdomen/pelvis w/o contrast on 02/23/2023. His family states that was already performed; unable to view results / report in chart today.   Fall Screening: Do you usually have a device to assist in your mobility? Yes   Medications: Current Outpatient Medications  Medication Sig Dispense Refill   amLODipine (NORVASC) 10 MG tablet Take 10 mg by mouth daily as needed.     clobetasol cream  (TEMOVATE) 0.05 % Apply 1 Application topically 2 (two) times daily. Apply twice daily for up to 2 weeks. 30 g 0   diclofenac Sodium (VOLTAREN) 1 % GEL APPLY 2 GRAMS TO THE AFFECTED AREA(S) BY TOPICAL ROUTE 2 TIMES PER DAY     gabapentin (NEURONTIN) 250 MG/5ML solution Take 3 mLs (150 mg total) by mouth at bedtime. (Patient taking differently: Take 150 mg by mouth daily as needed.) 90 mL 2   levothyroxine (SYNTHROID) 150 MCG tablet Take 150 mcg by mouth daily.     loperamide (IMODIUM) 2 MG capsule Take by mouth as needed for diarrhea or loose stools.     tadalafil (CIALIS) 5 MG tablet Take 5 mg by mouth daily.     oxyCODONE-acetaminophen (PERCOCET/ROXICET) 5-325 MG tablet Take 1 tablet by mouth every 8 (eight) hours as needed for severe pain. (Patient not taking: Reported on 04/06/2023) 30 tablet 0   tamsulosin (FLOMAX) 0.4 MG CAPS capsule Take 0.4 mg by mouth daily. (Patient not taking: Reported on 04/06/2023)     No current facility-administered medications for this visit.    Allergies: No Known Allergies  Past Medical History:  Diagnosis Date   Arthritis    Hypertension    Hyperthyroidism    lung ca 11/2020   Past Surgical History:  Procedure Laterality Date   COLONOSCOPY N/A 09/02/2016   Procedure: COLONOSCOPY;  Surgeon: Brian Hippo, MD;  [facility];  Service: Endoscopy;  Laterality: N/A;  830   NO PAST SURGERIES     POLYPECTOMY  09/02/2016   Procedure: POLYPECTOMY;  Surgeon: Brian Hippo, MD;  Location: [facility];  Service: Endoscopy;;   Family  History  Problem Relation Age of Onset   Colon cancer Other    Social History   Socioeconomic History   Marital status: Divorced    Spouse name: Not on file   Number of children: Not on file   Years of education: Not on file   Highest education level: Not on file  Occupational History   Not on file  Tobacco Use   Smoking status: Former    Packs/day: 1.50    Years: 40.00    Additional pack years: 0.00     Total pack years: 60.00    Types: Cigarettes    Quit date: 2000    Years since quitting: 24.5   Smokeless tobacco: Never  Substance and Sexual Activity   Alcohol use: No   Drug use: No   Sexual activity: Not on file  Other Topics Concern   Not on file  Social History Narrative   Not on file   Social Determinants of Health   Financial Resource Strain: Not on file  Food Insecurity: No Food Insecurity (02/23/2023)   Hunger Vital Sign    Worried About Running Out of Food in the Last Year: Never true    Ran Out of Food in the Last Year: Never true  Transportation Needs: No Transportation Needs (02/23/2023)   PRAPARE - Administrator, Civil Service (Medical): No    Lack of Transportation (Non-Medical): No  Physical Activity: Not on file  Stress: Not on file  Social Connections: Not on file  Intimate Partner Violence: Not on file    SUBJECTIVE  Review of Systems  Constitutional: Patient denies general malaise lntegumentary: As per HPI Cardiovascular: Patient denies chest pain or syncope Respiratory: Patient denies shortness of breath Gastrointestinal: Patient denies nausea, vomiting, constipation, or diarrhea Musculoskeletal: Patient denies muscle cramps or weakness Neurologic: Patient denies convulsions or seizures Psychiatric: Patient denies memory problems Allergic/Immunologic: Patient denies recent allergic reaction(s) Hematologic/Lymphatic: Patient denies bleeding tendencies Endocrine: Patient denies heat/cold intolerance  GU: As per HPI.  OBJECTIVE Vitals:   04/06/23 1042  BP: 131/74  Pulse: 86  Temp: 97.9 F (36.6 C)   There is no height or weight on file to calculate BMI.  Physical Examination  Constitutional: No obvious distress; patient is non-toxic appearing  Cardiovascular: No visible lower extremity edema.  Respiratory: The patient does not have audible wheezing/stridor; respirations do not appear labored  Gastrointestinal: Abdomen  non-distended Skin: Vitiligo  Musculoskeletal: Normal ROM of UEs  Neurologic: CN 2-12 grossly intact Psychiatric: Answered questions appropriately with normal affect  Hematologic/Lymphatic/Immunologic: No obvious bruises or sites of spontaneous bleeding  Genitourinary: Penis is abnormal in appearance; penile skin tissue appears dry and white with nearly complete loss of melanin throughout. There are areas of scabbing around the corona; no active bleeding noted. No edema, erythema, or warmth. There was a small amount of white discharge noted at urethral orifice, which was swabbed for culture. Scrotum was normal in appearance.  Pelvic exam was chaperoned by H. Cobb, LPN.  UA: unable to void in office - stated he had urinated just prior to arriving. Patient planning to drop of urine specimen later (cup given).  PVR: 141 ml   ASSESSMENT Penile irritation - Plan: clobetasol cream (TEMOVATE) 0.05 %, Ambulatory referral to Dermatology, Aerobic Culture w Gram Stain (superficial specimen), CANCELED: Aerobic Culture w Gram Stain (superficial specimen)  Vitiligo - Plan: Ambulatory referral to Dermatology  Discolored skin - Plan: clobetasol cream (TEMOVATE) 0.05 %, Ambulatory referral to Dermatology  Carcinoma of prostate (HCC) - Plan: BLADDER SCAN AMB NON-IMAGING  Penile discharge - Plan: Ambulatory referral to Dermatology, Aerobic Culture w Gram Stain (superficial specimen), CANCELED: Aerobic Culture w Gram Stain (superficial specimen)  We discussed penile irritation with loss of pigmentation. Possible etiologies include but are not limited to: vitiligo secondary to oncologic medications, lichen sclerosus / balanitis xerotica obliterans, penile cancer. We agreed to treat with Clobetasol 0.05% on an as-needed basis along with daily use of Vaseline as a skin protectorant. Advised patient to establish care with a dermatologist for further evaluation; referral placed. Culture sent to evaluate penile  discharge- suspected to be due to reactionary tissue inflammation inside urethral orifice. Requesting relevant records from PCP & nephrology. Will plan for follow up in 3 months with Dr. Ronne [name] or sooner if needed. Pt verbalized understanding and agreement. All questions were answered.  PLAN Advised the following: Culture swab sent. Clobetasol 0.05% PRN.  Vaseline use daily as a skin protectorant.  Referral placed to dermatology.  Relevant records requested from PCP & nephrology.  Return in about 3 months (around 07/07/2023) for f/u with Dr. Ronne [name].  Orders Placed This Encounter  Procedures   Aerobic Culture w Gram Stain (superficial specimen)   Ambulatory referral to Dermatology    Referral Priority:   Urgent    Referral Type:   Consultation    Referral Reason:   Specialty Services Required    Requested Specialty:   Dermatology    Number of Visits Requested:   1   BLADDER SCAN AMB NON-IMAGING    It has been explained that the patient is to follow regularly with their PCP in addition to all other providers involved in their care and to follow instructions provided by these respective offices. Patient advised to contact urology clinic if any urologic-pertaining questions, concerns, new symptoms or problems arise in the interim period.  There are no Patient Instructions on file for this visit.  Electronically signed by:  Donnita Falls, MSN, FNP-C, CUNP 04/06/2023 1:26 PM

## 2023-04-06 NOTE — Addendum Note (Signed)
Addended byEvette Georges on: 04/06/2023 01:26 PM   Modules accepted: Orders

## 2023-04-06 NOTE — Progress Notes (Signed)
post void residual=141 

## 2023-04-06 NOTE — Addendum Note (Signed)
Addended by: Gustavus Messing on: 04/06/2023 03:33 PM   Modules accepted: Orders

## 2023-04-07 ENCOUNTER — Ambulatory Visit: Payer: Medicare HMO

## 2023-04-07 ENCOUNTER — Other Ambulatory Visit: Payer: Medicare HMO

## 2023-04-07 DIAGNOSIS — C3490 Malignant neoplasm of unspecified part of unspecified bronchus or lung: Secondary | ICD-10-CM | POA: Diagnosis not present

## 2023-04-07 DIAGNOSIS — R369 Urethral discharge, unspecified: Secondary | ICD-10-CM | POA: Diagnosis not present

## 2023-04-07 DIAGNOSIS — E039 Hypothyroidism, unspecified: Secondary | ICD-10-CM | POA: Diagnosis not present

## 2023-04-07 LAB — URINALYSIS, ROUTINE W REFLEX MICROSCOPIC
Bilirubin, UA: NEGATIVE
Glucose, UA: NEGATIVE
Nitrite, UA: NEGATIVE
Specific Gravity, UA: 1.03 (ref 1.005–1.030)
Urobilinogen, Ur: 0.2 mg/dL (ref 0.2–1.0)
pH, UA: 5.5 (ref 5.0–7.5)

## 2023-04-07 LAB — MICROSCOPIC EXAMINATION: WBC, UA: >30 /hpf — AB (ref 0–5)

## 2023-04-07 NOTE — Progress Notes (Signed)
Patient here to drop of urine specimen for ua/uc. Orders placed. UA reviewed by Maralyn Sago, NP. No current treatment plan- culture pending

## 2023-04-09 DIAGNOSIS — C3490 Malignant neoplasm of unspecified part of unspecified bronchus or lung: Secondary | ICD-10-CM | POA: Diagnosis not present

## 2023-04-09 DIAGNOSIS — E039 Hypothyroidism, unspecified: Secondary | ICD-10-CM | POA: Diagnosis not present

## 2023-04-09 LAB — GRAM STAIN

## 2023-04-09 LAB — AEROBIC CULTURE

## 2023-04-10 LAB — URINE CULTURE

## 2023-04-12 ENCOUNTER — Other Ambulatory Visit: Payer: Self-pay | Admitting: Urology

## 2023-04-12 ENCOUNTER — Telehealth: Payer: Self-pay

## 2023-04-12 ENCOUNTER — Encounter: Payer: Self-pay | Admitting: Urology

## 2023-04-12 DIAGNOSIS — R369 Urethral discharge, unspecified: Secondary | ICD-10-CM

## 2023-04-12 DIAGNOSIS — R8279 Other abnormal findings on microbiological examination of urine: Secondary | ICD-10-CM

## 2023-04-12 DIAGNOSIS — Z22322 Carrier or suspected carrier of Methicillin resistant Staphylococcus aureus: Secondary | ICD-10-CM

## 2023-04-12 MED ORDER — AMOXICILLIN-POT CLAVULANATE 875-125 MG PO TABS
1.0000 | ORAL_TABLET | Freq: Two times a day (BID) | ORAL | 0 refills | Status: AC
Start: 2023-04-12 — End: 2023-04-19

## 2023-04-12 NOTE — Progress Notes (Signed)
Per notes faxed by [facility]: - CT abdomen/pelvis on 08/10/2021 showed benign-appearing bilateral renal cysts; no solid renal mass or hydronephrosis. - BPH with urinary retention s/p TURP. - No history of stones or recurrent UTIs. - 10/27/2021: UA with 1+ blood and 3+ leukocytes; nitrite negative.

## 2023-04-12 NOTE — Telephone Encounter (Signed)
Daughter called, made aware and voiced understanding.

## 2023-04-12 NOTE — Progress Notes (Signed)
Please let pt know that the culture taken of penile discharge showed heavy growth of MRSA. I will placed referral to Infectious Disease due to antibiotic resistance. For additional heavy growth of Beta hemolytic Streptococcus plus urine culture result, advised to take Augmentin as prescribed at this time. Thanks.

## 2023-04-12 NOTE — Telephone Encounter (Signed)
-----   Message from [name] sent at 04/12/2023  8:26 AM EDT ----- Please let pt know urine culture result. I have sent prescription for Augmentin. Thanks.

## 2023-04-12 NOTE — Telephone Encounter (Signed)
-----   Message from Donnita Falls sent at 04/12/2023  8:33 AM EDT ----- Please let pt know that the culture taken of penile discharge showed heavy growth of MRSA. I will placed referral to Infectious Disease due to antibiotic resistance. For additional heavy growth of Beta hemolytic Streptococcus plus urine culture result, advised to take Augmentin as prescribed at this time. Thanks.

## 2023-04-12 NOTE — Telephone Encounter (Signed)
 Daughter called and made aware. 

## 2023-04-13 DIAGNOSIS — M7989 Other specified soft tissue disorders: Secondary | ICD-10-CM | POA: Diagnosis not present

## 2023-04-13 DIAGNOSIS — N184 Chronic kidney disease, stage 4 (severe): Secondary | ICD-10-CM | POA: Diagnosis not present

## 2023-04-13 DIAGNOSIS — L80 Vitiligo: Secondary | ICD-10-CM | POA: Diagnosis not present

## 2023-04-13 DIAGNOSIS — M170 Bilateral primary osteoarthritis of knee: Secondary | ICD-10-CM | POA: Diagnosis not present

## 2023-04-13 DIAGNOSIS — C3490 Malignant neoplasm of unspecified part of unspecified bronchus or lung: Secondary | ICD-10-CM | POA: Diagnosis not present

## 2023-04-13 DIAGNOSIS — D631 Anemia in chronic kidney disease: Secondary | ICD-10-CM | POA: Diagnosis not present

## 2023-04-13 DIAGNOSIS — R809 Proteinuria, unspecified: Secondary | ICD-10-CM | POA: Diagnosis not present

## 2023-04-13 DIAGNOSIS — N400 Enlarged prostate without lower urinary tract symptoms: Secondary | ICD-10-CM | POA: Diagnosis not present

## 2023-04-13 DIAGNOSIS — Z79890 Hormone replacement therapy (postmenopausal): Secondary | ICD-10-CM | POA: Diagnosis not present

## 2023-04-13 DIAGNOSIS — E039 Hypothyroidism, unspecified: Secondary | ICD-10-CM | POA: Diagnosis not present

## 2023-04-13 DIAGNOSIS — I129 Hypertensive chronic kidney disease with stage 1 through stage 4 chronic kidney disease, or unspecified chronic kidney disease: Secondary | ICD-10-CM | POA: Diagnosis not present

## 2023-04-13 DIAGNOSIS — Z79899 Other long term (current) drug therapy: Secondary | ICD-10-CM | POA: Diagnosis not present

## 2023-04-19 ENCOUNTER — Inpatient Hospital Stay: Payer: Medicare HMO

## 2023-04-19 ENCOUNTER — Ambulatory Visit (HOSPITAL_COMMUNITY): Payer: Medicare HMO

## 2023-04-21 ENCOUNTER — Ambulatory Visit: Payer: Medicare HMO | Admitting: Internal Medicine

## 2023-04-26 DIAGNOSIS — Z681 Body mass index (BMI) 19 or less, adult: Secondary | ICD-10-CM | POA: Diagnosis not present

## 2023-04-26 DIAGNOSIS — Z8546 Personal history of malignant neoplasm of prostate: Secondary | ICD-10-CM | POA: Diagnosis not present

## 2023-04-26 DIAGNOSIS — Z713 Dietary counseling and surveillance: Secondary | ICD-10-CM | POA: Diagnosis not present

## 2023-04-26 DIAGNOSIS — R197 Diarrhea, unspecified: Secondary | ICD-10-CM | POA: Diagnosis not present

## 2023-04-26 DIAGNOSIS — R350 Frequency of micturition: Secondary | ICD-10-CM | POA: Diagnosis not present

## 2023-04-26 DIAGNOSIS — R109 Unspecified abdominal pain: Secondary | ICD-10-CM | POA: Diagnosis not present

## 2023-04-29 ENCOUNTER — Inpatient Hospital Stay: Payer: Medicare HMO | Attending: Physician Assistant

## 2023-04-29 ENCOUNTER — Ambulatory Visit (HOSPITAL_COMMUNITY)
Admission: RE | Admit: 2023-04-29 | Discharge: 2023-04-29 | Disposition: A | Discharge status: Home / Self Care | Payer: Medicare HMO | Source: Ambulatory Visit | Attending: Physician Assistant | Admitting: Physician Assistant

## 2023-04-29 DIAGNOSIS — J8410 Pulmonary fibrosis, unspecified: Secondary | ICD-10-CM | POA: Diagnosis not present

## 2023-04-29 DIAGNOSIS — C3411 Malignant neoplasm of upper lobe, right bronchus or lung: Secondary | ICD-10-CM | POA: Insufficient documentation

## 2023-04-29 DIAGNOSIS — R590 Localized enlarged lymph nodes: Secondary | ICD-10-CM | POA: Diagnosis not present

## 2023-04-29 DIAGNOSIS — N189 Chronic kidney disease, unspecified: Secondary | ICD-10-CM | POA: Insufficient documentation

## 2023-04-29 DIAGNOSIS — I700 Atherosclerosis of aorta: Secondary | ICD-10-CM | POA: Diagnosis not present

## 2023-04-29 DIAGNOSIS — C3490 Malignant neoplasm of unspecified part of unspecified bronchus or lung: Secondary | ICD-10-CM | POA: Diagnosis not present

## 2023-04-29 DIAGNOSIS — C7989 Secondary malignant neoplasm of other specified sites: Secondary | ICD-10-CM | POA: Insufficient documentation

## 2023-04-29 DIAGNOSIS — I129 Hypertensive chronic kidney disease with stage 1 through stage 4 chronic kidney disease, or unspecified chronic kidney disease: Secondary | ICD-10-CM | POA: Insufficient documentation

## 2023-05-03 DIAGNOSIS — C3411 Malignant neoplasm of upper lobe, right bronchus or lung: Secondary | ICD-10-CM | POA: Diagnosis not present

## 2023-05-03 DIAGNOSIS — I129 Hypertensive chronic kidney disease with stage 1 through stage 4 chronic kidney disease, or unspecified chronic kidney disease: Secondary | ICD-10-CM | POA: Diagnosis not present

## 2023-05-03 DIAGNOSIS — N184 Chronic kidney disease, stage 4 (severe): Secondary | ICD-10-CM | POA: Diagnosis not present

## 2023-05-03 DIAGNOSIS — D631 Anemia in chronic kidney disease: Secondary | ICD-10-CM | POA: Diagnosis not present

## 2023-05-03 DIAGNOSIS — N2581 Secondary hyperparathyroidism of renal origin: Secondary | ICD-10-CM | POA: Diagnosis not present

## 2023-05-03 NOTE — Progress Notes (Signed)
[facility] OFFICE PROGRESS NOTE  [name], MD [address] 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   Biomarker Findings Tumor Mutational Burden - 10 Muts/Mb Microsatellite status - MS-Stable Genomic Findings For a complete list of the genes assayed, please refer to the Appendix. KRAS G12C NFKBIA amplification NKX2-1 amplification RBM10 D637fs*80 7 Disease relevant genes with no reportable alterations: ALK, BRAF, EGFR, ERBB2, MET, RET, ROS1   PDL1: 90%  PRIOR THERAPY: 1) Weekly concurrent chemoradiation with carboplatin for an AUC of 2 and paclitaxel 45 mg/m.  First dose on 12/30/2020.  Status post 6 cycles    2) First-line treatment with immunotherapy with Libtayo (Cempilimab) 350 Mg IV every 3 weeks. First dose on 03/19/21. Status post 32 cycles.  Discontinued due to completing almost 2 years of intended treatment and having worsening CKD.   CURRENT THERAPY: Observation   INTERVAL HISTORY: [name] 89 y.o. male returnsto the clinic today for a follow-up visit accompanied by his son-in-law. The patient is feeling fairly well today without any concern complaints.  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  His last appointment, the patient was struggling with worsening creatinine.  Since he almost completed his 2 years of treatment, recommended discontinuing his treatment early.  He is currently on observation and feeling well.  He is following closely with urology.  He also follows with [facility] in [place] for CKD.  Lower extremity  swelling?    MEDICAL HISTORY: Past Medical History:  Diagnosis Date   Arthritis    Hypertension    Hyperthyroidism    lung ca 11/2020    ALLERGIES:  has No Known Allergies.  MEDICATIONS:  Current Outpatient Medications  Medication Sig Dispense Refill   amLODipine (NORVASC) 10 MG tablet Take 10 mg by mouth daily as needed.     clobetasol cream (TEMOVATE) 0.05 % Apply 1 Application topically 2 (two) times daily. Apply twice daily for up to 2 weeks. 30 g 0   diclofenac Sodium (VOLTAREN) 1 % GEL APPLY 2 GRAMS TO THE AFFECTED AREA(S) BY TOPICAL ROUTE 2 TIMES PER DAY     gabapentin (NEURONTIN) 250 MG/5ML solution Take 3 mLs (150 mg total) by mouth at bedtime. (Patient taking differently: Take 150 mg by mouth daily as needed.) 90 mL 2   levothyroxine (SYNTHROID) 150 MCG tablet Take 150 mcg by mouth daily.     loperamide (IMODIUM) 2 MG capsule Take by mouth as needed for diarrhea or loose stools.     oxyCODONE-acetaminophen (PERCOCET/ROXICET) 5-325 MG tablet Take 1 tablet by mouth every 8 (eight) hours as needed for severe pain. (Patient not taking: Reported on 04/06/2023) 30 tablet 0   tadalafil (CIALIS) 5 MG tablet Take 5 mg by mouth daily.     tamsulosin (FLOMAX) 0.4 MG CAPS capsule Take 0.4 mg by mouth daily. (Patient not taking: Reported on 04/06/2023)     No current facility-administered medications for this visit.    SURGICAL HISTORY:  Past Surgical History:  Procedure Laterality Date   COLONOSCOPY N/A 09/02/2016   Procedure: COLONOSCOPY;  Surgeon: [name], MD;  [facility];  Service: Endoscopy;  Laterality: N/A;  830   NO PAST SURGERIES     POLYPECTOMY  09/02/2016   Procedure: POLYPECTOMY;  Surgeon: [name], MD;  Location: [facility];  Service: Endoscopy;;    REVIEW OF SYSTEMS:   Review of Systems  Constitutional: Negative for appetite change, chills, fatigue, fever and unexpected weight change.  HENT:   Negative for mouth sores, nosebleeds, sore  throat and trouble swallowing.   Eyes: Negative for eye problems and icterus.  Respiratory: Negative for cough, hemoptysis, shortness of breath and wheezing.   Cardiovascular: Negative for chest pain and leg swelling.  Gastrointestinal: Negative for abdominal pain, constipation, diarrhea, nausea and vomiting.  Genitourinary: Negative for bladder incontinence, difficulty urinating, dysuria, frequency and hematuria.   Musculoskeletal: Negative for back pain, gait problem, neck pain and neck stiffness.  Skin: Negative for itching and rash.  Neurological: Negative for dizziness, extremity weakness, gait problem, headaches, light-headedness and seizures.  Hematological: Negative for adenopathy. Does not bruise/bleed easily.  Psychiatric/Behavioral: Negative for confusion, depression and sleep disturbance. The patient is not nervous/anxious.     PHYSICAL EXAMINATION:  There were no vitals taken for this visit.  ECOG PERFORMANCE STATUS: {CHL ONC ECOG [id number]  Physical Exam  Constitutional: Oriented to person, place, and time and well-developed, well-nourished, and in no distress. No distress.  HENT:  Head: Normocephalic and atraumatic.  Mouth/Throat: Oropharynx is clear and moist. No oropharyngeal exudate.  Eyes: Conjunctivae are normal. Right eye exhibits no discharge. Left eye exhibits no discharge. No scleral icterus.  Neck: Normal range of motion. Neck supple.  Cardiovascular: Normal rate, regular rhythm, normal heart sounds and intact distal pulses.   Pulmonary/Chest: Effort normal and breath sounds normal. No respiratory distress. No wheezes. No rales.  Abdominal: Soft. Bowel sounds are normal. Exhibits no distension and no mass. There is no tenderness.  Musculoskeletal: Normal range of motion. Exhibits no edema.  Lymphadenopathy:    No cervical adenopathy.  Neurological: Alert and oriented to person, place, and time. Exhibits normal muscle tone. Gait normal. Coordination normal.   Skin: Skin is warm and dry. No rash noted. Not diaphoretic. No erythema. No pallor.  Psychiatric: Mood, memory and judgment normal.  Vitals reviewed.  LABORATORY DATA: Lab Results  Component Value Date   WBC 3.8 (L) 02/23/2023   HGB 9.9 (L) 02/23/2023   HCT 29.9 (L) 02/23/2023   MCV 91.2 02/23/2023   PLT 190 02/23/2023      Chemistry      Component Value Date/Time   NA 137 02/23/2023 1005   K 4.2 02/23/2023 1005   CL 107 02/23/2023 1005   CO2 23 02/23/2023 1005   BUN 68 (H) 02/23/2023 1005   CREATININE 4.65 (H) 02/23/2023 1005      Component Value Date/Time   CALCIUM 8.2 (L) 02/23/2023 1005   ALKPHOS 63 02/23/2023 1005   AST 7 (L) 02/23/2023 1005   ALT 6 02/23/2023 1005   BILITOT 0.3 02/23/2023 1005       RADIOGRAPHIC STUDIES:  CT CHEST ABDOMEN PELVIS WO CONTRAST  Result Date: 05/03/2023 CLINICAL DATA:  Non-small-cell lung cancer. Right upper lobe primary. * Tracking Code: BO * EXAM: CT CHEST, ABDOMEN AND PELVIS WITHOUT CONTRAST TECHNIQUE: Multidetector CT imaging of the chest, abdomen and pelvis was performed following the standard protocol without IV contrast. RADIATION DOSE REDUCTION: This exam was performed according to the departmental dose-optimization program which includes automated exposure control, adjustment of the mA and/or kV according to patient size and/or use  of iterative reconstruction technique. COMPARISON:  01/27/2023 FINDINGS: CT CHEST FINDINGS Cardiovascular: Aortic atherosclerosis. Tortuous thoracic aorta. Normal heart size with minimal anterior pericardial fluid, likely physiologic. Left main and 3 vessel coronary artery calcification. Pulmonary artery enlargement, outflow tract 3.6 cm. Mediastinum/Nodes: Index AP window node measures 1.0 cm on 32/2 and is similar to 1.1 cm on the prior. Hilar regions poorly evaluated without intravenous contrast. Lungs/Pleura: No pleural fluid. Advanced centrilobular and paraseptal emphysema. Right upper lobe volume  loss, architectural distortion, and traction bronchiectasis, presumably radiation induced. No well-defined residual or locally recurrent disease. Musculoskeletal: Mild bilateral gynecomastia. Absence of a portion of the third anterolateral right rib including on 25/2 is similar. There is also deformity of the anterolateral right fourth rib which is not significantly changed. CT ABDOMEN PELVIS FINDINGS Hepatobiliary: Normal liver. Normal gallbladder, without biliary ductal dilatation. Pancreas: Normal, without mass or ductal dilatation. Spleen: Normal in size, without focal abnormality. Adrenals/Urinary Tract: Normal adrenal glands. Mild renal cortical thinning bilaterally. Bilateral low-density renal lesions are likely cysts . In the absence of clinically indicated signs/symptoms require(s) no independent follow-up. Largest is in the left kidney at 3.1 cm. No hydronephrosis. The bladder again is moderately thick walled. Stomach/Bowel: Normal stomach, without wall thickening. Normal noncontrast appearance/caliber of large and small bowel loops. Vascular/Lymphatic: Aortic atherosclerosis. No abdominopelvic adenopathy. Reproductive: Radiation seeds in the prostate.  Mild prostatomegaly. Other: No significant free fluid.  No free intraperitoneal air. Musculoskeletal: Osteopenia.  Moderate right hip osteoarthritis. IMPRESSION: 1. No interval change or evidence of progressive disease. 2. 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 Chronic destruction of a portion of the third right rib. 3. Similar borderline mediastinal adenopathy, favored to be reactive. 4. Chronic bladder wall thickening likely represents outlet obstruction. 5. Incidental findings, including: Aortic atherosclerosis (ICD10-I70.0), coronary artery atherosclerosis and emphysema (ICD10-J43.9). Pulmonary artery enlargement suggests pulmonary arterial hypertension. Electronically Signed   By: Jeronimo Greaves M.D.   On: 05/03/2023 14:51     ASSESSMENT/PLAN:   No problem-specific Assessment & Plan notes found for this encounter.   No orders of the defined types were placed in this encounter.    I spent {CHL ONC TIME VISIT - JYNWG:[id number]} counseling the patient face to face. The total time spent in the appointment was {CHL ONC TIME VISIT - HQION:[id number]}.   L , PA-C 05/03/23

## 2023-05-06 ENCOUNTER — Inpatient Hospital Stay (HOSPITAL_BASED_OUTPATIENT_CLINIC_OR_DEPARTMENT_OTHER): Payer: Medicare HMO | Admitting: Physician Assistant

## 2023-05-06 ENCOUNTER — Encounter: Payer: Self-pay | Admitting: Internal Medicine

## 2023-05-06 ENCOUNTER — Other Ambulatory Visit: Payer: Self-pay

## 2023-05-06 ENCOUNTER — Ambulatory Visit (INDEPENDENT_AMBULATORY_CARE_PROVIDER_SITE_OTHER): Payer: Medicare HMO | Admitting: Internal Medicine

## 2023-05-06 VITALS — BP 151/88 | HR 90 | Temp 98.0°F | Ht 74.0 in | Wt 140.0 lb

## 2023-05-06 VITALS — BP 155/80 | HR 85 | Temp 97.8°F | Resp 17 | Wt 140.5 lb

## 2023-05-06 DIAGNOSIS — C3411 Malignant neoplasm of upper lobe, right bronchus or lung: Secondary | ICD-10-CM | POA: Diagnosis not present

## 2023-05-06 DIAGNOSIS — C3490 Malignant neoplasm of unspecified part of unspecified bronchus or lung: Secondary | ICD-10-CM | POA: Diagnosis not present

## 2023-05-06 DIAGNOSIS — I129 Hypertensive chronic kidney disease with stage 1 through stage 4 chronic kidney disease, or unspecified chronic kidney disease: Secondary | ICD-10-CM | POA: Diagnosis not present

## 2023-05-06 DIAGNOSIS — R369 Urethral discharge, unspecified: Secondary | ICD-10-CM | POA: Insufficient documentation

## 2023-05-06 DIAGNOSIS — N189 Chronic kidney disease, unspecified: Secondary | ICD-10-CM | POA: Diagnosis not present

## 2023-05-06 DIAGNOSIS — C7989 Secondary malignant neoplasm of other specified sites: Secondary | ICD-10-CM | POA: Diagnosis not present

## 2023-05-06 MED ORDER — DOXYCYCLINE HYCLATE 100 MG PO TBEC: 100.0000 mg | DELAYED_RELEASE_TABLET | Freq: Two times a day (BID) | ORAL | 0 refills | Status: DC

## 2023-05-06 NOTE — Progress Notes (Signed)
[facility]      Reason for Consult: penile discharge    Referring Physician: Purcell Nails FNP    Patient ID: [name], male    DOB: 01-04-1933, 89 y.o.   MRN: [id number]  HPI:   Mr. [name] is here for evaluaton of penile discharge.   He has a history of non-small cell lung cancer, adenosquamous carcinoma s/p chemoradiation in 2022 and follow up CT with enlarging mass and started on immunotherapy.  History of prostate cancer in 2016 with prostatomegaly.  He was seen by his primary provider and noted penile discharge. Swab culture done with Group B Strep and MRSA.  Started on Augmentin.  Treated with cipro then cephalexin.  Has not been on MRSA coverage.  Discharge has resolved.  Some irritation.    Past Medical History:  Diagnosis Date   Arthritis    Hypertension    Hyperthyroidism    lung ca 11/2020    Prior to Admission medications   Medication Sig Start Date End Date Taking? Authorizing Provider  amLODipine (NORVASC) 10 MG tablet Take 10 mg by mouth daily as needed. 01/16/22   [provider]  clobetasol cream (TEMOVATE) 0.05 % Apply 1 Application topically 2 (two) times daily. Apply twice daily for up to 2 weeks. 04/06/23   Donnita Falls, FNP  diclofenac Sodium (VOLTAREN) 1 % GEL APPLY 2 GRAMS TO THE AFFECTED AREA(S) BY TOPICAL ROUTE 2 TIMES PER DAY 10/13/22   [provider]  gabapentin (NEURONTIN) 250 MG/5ML solution Take 3 mLs (150 mg total) by mouth at bedtime. Patient taking differently: Take 150 mg by mouth daily as needed. 01/13/21   Tanner, Kathrin Greathouse., PA-C  levothyroxine (SYNTHROID) 150 MCG tablet Take 150 mcg by mouth daily. 06/06/22   [provider]  loperamide (IMODIUM) 2 MG capsule Take by mouth as needed for diarrhea or loose stools.    [provider]  oxyCODONE-acetaminophen (PERCOCET/ROXICET) 5-325 MG tablet Take 1 tablet by mouth every 8 (eight) hours as needed for severe pain. Patient not taking:  Reported on 04/06/2023 12/23/20   Si Gaul, MD  tadalafil (CIALIS) 5 MG tablet Take 5 mg by mouth daily.    [provider]  tamsulosin (FLOMAX) 0.4 MG CAPS capsule Take 0.4 mg by mouth daily. Patient not taking: Reported on 04/06/2023    [provider]    No Known Allergies  Social History   Tobacco Use   Smoking status: Former    Current packs/day: 0.00    Average packs/day: 1.5 packs/day for 40.0 years (60.0 ttl pk-yrs)    Types: Cigarettes    Start date: 32    Quit date: 2000    Years since quitting: 24.6   Smokeless tobacco: Never  Substance Use Topics   Alcohol use: No   Drug use: No    Family History  Problem Relation Age of Onset   Colon cancer Other     Review of Systems  Constitutional: negative for fevers and chills All other systems reviewed and are negative    Constitutional: in no apparent distress There were no vitals filed for this visit. EYES: anicteric   Labs: Lab Results  Component Value Date   WBC 3.8 (L) 02/23/2023   HGB 9.9 (L) 02/23/2023   HCT 29.9 (L) 02/23/2023   MCV 91.2 02/23/2023   PLT 190 02/23/2023    Lab Results  Component Value Date   CREATININE 4.65 (H) 02/23/2023   BUN  68 (H) 02/23/2023   NA 137 02/23/2023   K 4.2 02/23/2023   CL 107 02/23/2023   CO2 23 02/23/2023    Lab Results  Component Value Date   ALT 6 02/23/2023   AST 7 (L) 02/23/2023   ALKPHOS 63 02/23/2023   BILITOT 0.3 02/23/2023   INR 1.1 12/12/2020     Assessment: penile discharge, resolved.  Will have him stop cipro and cephalexin since he has been treated for Strep more than adequately.  I will add doxy since he has not been treated for MRSA but seems largely resolved.    Plan: 1)  doxycycline 5 days Follow up as needed.

## 2023-07-07 ENCOUNTER — Emergency Department (HOSPITAL_COMMUNITY): Payer: Medicare HMO

## 2023-07-07 ENCOUNTER — Encounter (HOSPITAL_COMMUNITY): Payer: Self-pay

## 2023-07-07 ENCOUNTER — Inpatient Hospital Stay (HOSPITAL_COMMUNITY)
Admission: EM | Admit: 2023-07-07 | Discharge: 2023-07-16 | DRG: 189 | Disposition: A | Discharge status: Skilled Nursing Facility | Payer: Medicare HMO | Attending: Internal Medicine | Admitting: Internal Medicine

## 2023-07-07 ENCOUNTER — Other Ambulatory Visit: Payer: Self-pay

## 2023-07-07 DIAGNOSIS — I5033 Acute on chronic diastolic (congestive) heart failure: Secondary | ICD-10-CM | POA: Diagnosis not present

## 2023-07-07 DIAGNOSIS — Z79899 Other long term (current) drug therapy: Secondary | ICD-10-CM | POA: Diagnosis not present

## 2023-07-07 DIAGNOSIS — I5032 Chronic diastolic (congestive) heart failure: Secondary | ICD-10-CM

## 2023-07-07 DIAGNOSIS — L80 Vitiligo: Secondary | ICD-10-CM | POA: Diagnosis not present

## 2023-07-07 DIAGNOSIS — N1330 Unspecified hydronephrosis: Secondary | ICD-10-CM | POA: Diagnosis present

## 2023-07-07 DIAGNOSIS — Z800 Family history of malignant neoplasm of digestive organs: Secondary | ICD-10-CM

## 2023-07-07 DIAGNOSIS — N401 Enlarged prostate with lower urinary tract symptoms: Secondary | ICD-10-CM | POA: Diagnosis present

## 2023-07-07 DIAGNOSIS — R488 Other symbolic dysfunctions: Secondary | ICD-10-CM | POA: Diagnosis not present

## 2023-07-07 DIAGNOSIS — I169 Hypertensive crisis, unspecified: Secondary | ICD-10-CM | POA: Insufficient documentation

## 2023-07-07 DIAGNOSIS — I4510 Unspecified right bundle-branch block: Secondary | ICD-10-CM | POA: Diagnosis present

## 2023-07-07 DIAGNOSIS — E010 Iodine-deficiency related diffuse (endemic) goiter: Secondary | ICD-10-CM | POA: Diagnosis not present

## 2023-07-07 DIAGNOSIS — D631 Anemia in chronic kidney disease: Secondary | ICD-10-CM | POA: Diagnosis present

## 2023-07-07 DIAGNOSIS — J9601 Acute respiratory failure with hypoxia: Principal | ICD-10-CM | POA: Diagnosis present

## 2023-07-07 DIAGNOSIS — I160 Hypertensive urgency: Secondary | ICD-10-CM | POA: Diagnosis not present

## 2023-07-07 DIAGNOSIS — G629 Polyneuropathy, unspecified: Secondary | ICD-10-CM | POA: Diagnosis not present

## 2023-07-07 DIAGNOSIS — Z87891 Personal history of nicotine dependence: Secondary | ICD-10-CM | POA: Diagnosis not present

## 2023-07-07 DIAGNOSIS — C3490 Malignant neoplasm of unspecified part of unspecified bronchus or lung: Secondary | ICD-10-CM | POA: Diagnosis not present

## 2023-07-07 DIAGNOSIS — R600 Localized edema: Secondary | ICD-10-CM | POA: Insufficient documentation

## 2023-07-07 DIAGNOSIS — R262 Difficulty in walking, not elsewhere classified: Secondary | ICD-10-CM | POA: Diagnosis not present

## 2023-07-07 DIAGNOSIS — J439 Emphysema, unspecified: Secondary | ICD-10-CM | POA: Diagnosis not present

## 2023-07-07 DIAGNOSIS — C3412 Malignant neoplasm of upper lobe, left bronchus or lung: Secondary | ICD-10-CM | POA: Diagnosis not present

## 2023-07-07 DIAGNOSIS — R0602 Shortness of breath: Secondary | ICD-10-CM | POA: Diagnosis not present

## 2023-07-07 DIAGNOSIS — R498 Other voice and resonance disorders: Secondary | ICD-10-CM | POA: Diagnosis not present

## 2023-07-07 DIAGNOSIS — R0609 Other forms of dyspnea: Secondary | ICD-10-CM | POA: Diagnosis not present

## 2023-07-07 DIAGNOSIS — N35919 Unspecified urethral stricture, male, unspecified site: Secondary | ICD-10-CM | POA: Diagnosis not present

## 2023-07-07 DIAGNOSIS — M6281 Muscle weakness (generalized): Secondary | ICD-10-CM | POA: Diagnosis not present

## 2023-07-07 DIAGNOSIS — R0902 Hypoxemia: Secondary | ICD-10-CM | POA: Diagnosis not present

## 2023-07-07 DIAGNOSIS — J441 Chronic obstructive pulmonary disease with (acute) exacerbation: Secondary | ICD-10-CM | POA: Diagnosis present

## 2023-07-07 DIAGNOSIS — C3411 Malignant neoplasm of upper lobe, right bronchus or lung: Secondary | ICD-10-CM | POA: Diagnosis present

## 2023-07-07 DIAGNOSIS — N281 Cyst of kidney, acquired: Secondary | ICD-10-CM | POA: Diagnosis not present

## 2023-07-07 DIAGNOSIS — Z8546 Personal history of malignant neoplasm of prostate: Secondary | ICD-10-CM

## 2023-07-07 DIAGNOSIS — I130 Hypertensive heart and chronic kidney disease with heart failure and stage 1 through stage 4 chronic kidney disease, or unspecified chronic kidney disease: Secondary | ICD-10-CM | POA: Diagnosis not present

## 2023-07-07 DIAGNOSIS — R609 Edema, unspecified: Secondary | ICD-10-CM | POA: Diagnosis not present

## 2023-07-07 DIAGNOSIS — M1990 Unspecified osteoarthritis, unspecified site: Secondary | ICD-10-CM | POA: Diagnosis not present

## 2023-07-07 DIAGNOSIS — D6489 Other specified anemias: Secondary | ICD-10-CM | POA: Diagnosis not present

## 2023-07-07 DIAGNOSIS — R918 Other nonspecific abnormal finding of lung field: Secondary | ICD-10-CM | POA: Diagnosis not present

## 2023-07-07 DIAGNOSIS — Z79890 Hormone replacement therapy (postmenopausal): Secondary | ICD-10-CM

## 2023-07-07 DIAGNOSIS — R590 Localized enlarged lymph nodes: Secondary | ICD-10-CM | POA: Diagnosis not present

## 2023-07-07 DIAGNOSIS — I700 Atherosclerosis of aorta: Secondary | ICD-10-CM | POA: Diagnosis not present

## 2023-07-07 DIAGNOSIS — I071 Rheumatic tricuspid insufficiency: Secondary | ICD-10-CM | POA: Diagnosis not present

## 2023-07-07 DIAGNOSIS — N179 Acute kidney failure, unspecified: Secondary | ICD-10-CM | POA: Diagnosis not present

## 2023-07-07 DIAGNOSIS — D638 Anemia in other chronic diseases classified elsewhere: Secondary | ICD-10-CM | POA: Diagnosis not present

## 2023-07-07 DIAGNOSIS — E039 Hypothyroidism, unspecified: Secondary | ICD-10-CM | POA: Diagnosis present

## 2023-07-07 DIAGNOSIS — R338 Other retention of urine: Secondary | ICD-10-CM | POA: Diagnosis present

## 2023-07-07 DIAGNOSIS — J449 Chronic obstructive pulmonary disease, unspecified: Secondary | ICD-10-CM | POA: Insufficient documentation

## 2023-07-07 DIAGNOSIS — N1832 Chronic kidney disease, stage 3b: Secondary | ICD-10-CM | POA: Diagnosis present

## 2023-07-07 DIAGNOSIS — Z9981 Dependence on supplemental oxygen: Secondary | ICD-10-CM | POA: Diagnosis not present

## 2023-07-07 DIAGNOSIS — N35819 Other urethral stricture, male, unspecified site: Secondary | ICD-10-CM | POA: Diagnosis present

## 2023-07-07 DIAGNOSIS — I10 Essential (primary) hypertension: Secondary | ICD-10-CM | POA: Diagnosis present

## 2023-07-07 DIAGNOSIS — I3139 Other pericardial effusion (noninflammatory): Secondary | ICD-10-CM | POA: Diagnosis not present

## 2023-07-07 LAB — COMPREHENSIVE METABOLIC PANEL
ALT: 11 U/L (ref 0–44)
AST: 12 U/L — ABNORMAL LOW (ref 15–41)
Albumin: 2.7 g/dL — ABNORMAL LOW (ref 3.5–5.0)
Alkaline Phosphatase: 67 U/L (ref 38–126)
Anion gap: 8 (ref 5–15)
BUN: 26 mg/dL — ABNORMAL HIGH (ref 8–23)
CO2: 23 mmol/L (ref 22–32)
Calcium: 8 mg/dL — ABNORMAL LOW (ref 8.9–10.3)
Chloride: 106 mmol/L (ref 98–111)
Creatinine, Ser: 2.04 mg/dL — ABNORMAL HIGH (ref 0.61–1.24)
GFR, Estimated: 31 mL/min — ABNORMAL LOW (ref >60)
Glucose, Bld: 98 mg/dL (ref 70–99)
Potassium: 3.9 mmol/L (ref 3.5–5.1)
Sodium: 137 mmol/L (ref 135–145)
Total Bilirubin: 0.8 mg/dL (ref 0.3–1.2)
Total Protein: 7.3 g/dL (ref 6.5–8.1)

## 2023-07-07 LAB — CBC WITH DIFFERENTIAL/PLATELET
Abs Immature Granulocytes: 0.09 10*3/uL — ABNORMAL HIGH (ref 0.00–0.07)
Basophils Absolute: 0 10*3/uL (ref 0.0–0.1)
Basophils Relative: 0 %
Eosinophils Absolute: 0.1 10*3/uL (ref 0.0–0.5)
Eosinophils Relative: 1 %
HCT: 32.9 % — ABNORMAL LOW (ref 39.0–52.0)
Hemoglobin: 10.2 g/dL — ABNORMAL LOW (ref 13.0–17.0)
Immature Granulocytes: 2 %
Lymphocytes Relative: 14 %
Lymphs Abs: 0.8 10*3/uL (ref 0.7–4.0)
MCH: 29.7 pg (ref 26.0–34.0)
MCHC: 31 g/dL (ref 30.0–36.0)
MCV: 95.9 fL (ref 80.0–100.0)
Monocytes Absolute: 0.3 10*3/uL (ref 0.1–1.0)
Monocytes Relative: 6 %
Neutro Abs: 4.1 10*3/uL (ref 1.7–7.7)
Neutrophils Relative %: 77 %
Platelets: 167 10*3/uL (ref 150–400)
RBC: 3.43 MIL/uL — ABNORMAL LOW (ref 4.22–5.81)
RDW: 16 % — ABNORMAL HIGH (ref 11.5–15.5)
WBC: 5.3 10*3/uL (ref 4.0–10.5)
nRBC: 0 % (ref 0.0–0.2)

## 2023-07-07 LAB — URINALYSIS, ROUTINE W REFLEX MICROSCOPIC
Bacteria, UA: NONE SEEN
Bilirubin Urine: NEGATIVE
Glucose, UA: NEGATIVE mg/dL
Ketones, ur: NEGATIVE mg/dL
Nitrite: NEGATIVE
Protein, ur: 100 mg/dL — AB
Specific Gravity, Urine: 1.009 (ref 1.005–1.030)
pH: 7 (ref 5.0–8.0)

## 2023-07-07 LAB — MAGNESIUM: Magnesium: 1.9 mg/dL (ref 1.7–2.4)

## 2023-07-07 LAB — TROPONIN I (HIGH SENSITIVITY): Troponin I (High Sensitivity): 11 ng/L (ref <18)

## 2023-07-07 LAB — BRAIN NATRIURETIC PEPTIDE: B Natriuretic Peptide: 175 pg/mL — ABNORMAL HIGH (ref 0.0–100.0)

## 2023-07-07 MED ORDER — ALBUTEROL SULFATE (2.5 MG/3ML) 0.083% IN NEBU
10.0000 mg | INHALATION_SOLUTION | RESPIRATORY_TRACT | Status: AC
  Administered 2023-07-07: 10 mg via RESPIRATORY_TRACT
  Filled 2023-07-07: qty 12

## 2023-07-07 MED ORDER — LABETALOL HCL 5 MG/ML IV SOLN
10.0000 mg | Freq: Once | INTRAVENOUS | Status: AC
  Administered 2023-07-07: 10 mg via INTRAVENOUS
  Filled 2023-07-07: qty 4

## 2023-07-07 MED ORDER — IPRATROPIUM-ALBUTEROL 0.5-2.5 (3) MG/3ML IN SOLN: 3.0000 mL | Freq: Once | RESPIRATORY_TRACT | Status: DC

## 2023-07-07 MED ORDER — PREDNISONE 20 MG PO TABS
40.0000 mg | ORAL_TABLET | Freq: Once | ORAL | Status: AC
  Administered 2023-07-07: 40 mg via ORAL
  Filled 2023-07-07: qty 2

## 2023-07-07 MED ORDER — FUROSEMIDE 10 MG/ML IJ SOLN
40.0000 mg | INTRAMUSCULAR | Status: AC
  Administered 2023-07-07: 40 mg via INTRAVENOUS
  Filled 2023-07-07: qty 4

## 2023-07-07 MED ORDER — IPRATROPIUM BROMIDE 0.02 % IN SOLN
0.5000 mg | Freq: Once | RESPIRATORY_TRACT | Status: AC
  Administered 2023-07-07: 0.5 mg via RESPIRATORY_TRACT

## 2023-07-07 MED ORDER — HYDRALAZINE HCL 20 MG/ML IJ SOLN
10.0000 mg | INTRAMUSCULAR | Status: AC
  Administered 2023-07-07: 10 mg via INTRAVENOUS
  Filled 2023-07-07: qty 1

## 2023-07-07 MED ORDER — IPRATROPIUM BROMIDE 0.02 % IN SOLN
RESPIRATORY_TRACT | Status: AC
  Filled 2023-07-07: qty 2.5

## 2023-07-07 MED ORDER — NITROGLYCERIN 0.4 MG SL SUBL: 0.4000 mg | SUBLINGUAL_TABLET | SUBLINGUAL | Status: DC | PRN

## 2023-07-07 NOTE — ED Notes (Signed)
Patient transported to CT 

## 2023-07-07 NOTE — ED Provider Notes (Signed)
[facility] AT [facility] Provider Note   CSN: [id number] Arrival date & time: 07/07/23  1647     History  Chief Complaint  Patient presents with   Leg Swelling    [name] is a 89 y.o. male.  HPI   This patient is an 89 year old male, he has a history of hypertension on amlodipine and hydrochlorothiazide as well as labetalol, he takes Flomax for his prostate, Imodium for diarrhea and has been on gabapentin for neuropathy.  The patient presents with increasing swelling of the legs and increasing weakness, the family member states that he has not been out of his chair in 2 weeks, he has had progressive swelling of his legs and is not able to get out of the chair and also he has been urinating in a diaper or a bottle, he has not been coughing but has been increasingly short of breath and the paramedics found the patient to be 88% on room air.  No fevers or chills, no chest pain no headache no changes in vision  Home Medications Prior to Admission medications   Medication Sig Start Date End Date Taking? Authorizing Provider  amLODipine (NORVASC) 10 MG tablet Take 10 mg by mouth daily as needed. 01/16/22  Yes [provider]  diclofenac Sodium (VOLTAREN) 1 % GEL APPLY 2 GRAMS TO THE AFFECTED AREA(S) BY TOPICAL ROUTE 2 TIMES PER DAY 10/13/22  Yes [provider]  ferrous sulfate 325 (65 FE) MG EC tablet Take 325 mg by mouth daily with breakfast.   Yes [provider]  gabapentin (NEURONTIN) 250 MG/5ML solution Take 3 mLs (150 mg total) by mouth at bedtime. Patient taking differently: Take 150 mg by mouth daily as needed. 01/13/21  Yes Tanner, Kathrin Greathouse., PA-C  ibuprofen (ADVIL) 200 MG tablet Take 200 mg by mouth every 6 (six) hours as needed for mild pain.   Yes [provider]  levothyroxine (SYNTHROID) 150 MCG tablet Take 150 mcg by mouth daily. 06/06/22  Yes [provider]  loperamide (IMODIUM) 2 MG capsule Take  by mouth as needed for diarrhea or loose stools.   Yes [provider]  tadalafil (CIALIS) 5 MG tablet Take 5 mg by mouth daily.   Yes [provider]  tamsulosin (FLOMAX) 0.4 MG CAPS capsule Take 0.4 mg by mouth daily.   Yes [provider]  hydrochlorothiazide (MICROZIDE) 12.5 MG capsule Take 12.5 mg by mouth daily. Patient not taking: Reported on 07/07/2023 06/23/23   [provider]  labetalol (NORMODYNE) 200 MG tablet Take 200 mg by mouth 2 (two) times daily. Patient not taking: Reported on 07/07/2023 04/26/23   [provider]      Allergies    Patient has no known allergies.    Review of Systems   Review of Systems  All other systems reviewed and are negative.   Physical Exam Updated Vital Signs BP (!) 168/90 (BP Location: Left Arm)   Pulse (!) 101   Temp 98.5 F (36.9 C) (Oral)   Resp (!) 21   Ht 1.88 m (6\' 2" )   Wt 63.5 kg   SpO2 100%   BMI 17.97 kg/m  Physical Exam Vitals and nursing note reviewed.  Constitutional:      Appearance: He is well-developed. He is ill-appearing.  HENT:     Head: Normocephalic and atraumatic.     Nose: No congestion or rhinorrhea.     Mouth/Throat:     Mouth: Mucous  membranes are moist.     Pharynx: No oropharyngeal exudate.  Eyes:     General: No scleral icterus.       Right eye: No discharge.        Left eye: No discharge.     Conjunctiva/sclera: Conjunctivae normal.     Pupils: Pupils are equal, round, and reactive to light.  Neck:     Thyroid: No thyromegaly.     Vascular: No JVD.     Comments: No JVD Cardiovascular:     Rate and Rhythm: Regular rhythm. Tachycardia present.     Heart sounds: Normal heart sounds. No murmur heard.    No friction rub. No gallop.  Pulmonary:     Effort: No respiratory distress.     Breath sounds: Wheezing and rhonchi present. No rales.     Comments: Tachypneic, oxygen of 88% Abdominal:     General: Bowel sounds are normal. There is no distension.      Palpations: Abdomen is soft. There is no mass.     Tenderness: There is no abdominal tenderness.  Musculoskeletal:        General: No tenderness. Normal range of motion.     Cervical back: Normal range of motion and neck supple.     Right lower leg: Edema present.     Left lower leg: Edema present.     Comments: Bilateral lower extremity edema  Lymphadenopathy:     Cervical: No cervical adenopathy.  Skin:    General: Skin is warm and dry.     Findings: No erythema or rash.  Neurological:     Mental Status: He is alert.     Coordination: Coordination normal.  Psychiatric:        Behavior: Behavior normal.     ED Results / Procedures / Treatments   Labs (all labs ordered are listed, but only abnormal results are displayed) Labs Reviewed  CBC WITH DIFFERENTIAL/PLATELET - Abnormal; Notable for the following components:      Result Value   RBC 3.43 (*)    Hemoglobin 10.2 (*)    HCT 32.9 (*)    RDW 16.0 (*)    Abs Immature Granulocytes 0.09 (*)    All other components within normal limits  COMPREHENSIVE METABOLIC PANEL - Abnormal; Notable for the following components:   BUN 26 (*)    Creatinine, Ser 2.04 (*)    Calcium 8.0 (*)    Albumin 2.7 (*)    AST 12 (*)    GFR, Estimated 31 (*)    All other components within normal limits  BRAIN NATRIURETIC PEPTIDE - Abnormal; Notable for the following components:   B Natriuretic Peptide 175.0 (*)    All other components within normal limits  URINALYSIS, ROUTINE W REFLEX MICROSCOPIC - Abnormal; Notable for the following components:   Hgb urine dipstick MODERATE (*)    Protein, ur 100 (*)    Leukocytes,Ua TRACE (*)    All other components within normal limits  MAGNESIUM  TROPONIN I (HIGH SENSITIVITY)    EKG EKG Interpretation Date/Time:  Wednesday July 07 2023 17:24:15 EDT Ventricular Rate:  113 PR Interval:  101 QRS Duration:  135 QT Interval:  380 QTC Calculation: 521 R Axis:   -19  Text Interpretation: Sinus or  ectopic atrial tachycardia Right bundle branch block Since last tracing rate faster Confirmed by Eber Hong ([id number]) on 07/07/2023 6:04:56 PM  Radiology DG Chest Port 1 View  Result Date: 07/07/2023 CLINICAL DATA:  sob. Non-small cell  lung cancer. Right upper lobe primary. EXAM: PORTABLE CHEST 1 VIEW COMPARISON:  Chest x-ray 12/12/2020, CT chest 04/29/2023 FINDINGS: The heart and mediastinal contours are unchanged. Aortic calcification. Right hilar prominence. Right upper lung zone scarring with superimposed airspace opacity peripherally. Chronic coarsened interstitial markings with no overt pulmonary edema. Blunting of bilateral costophrenic angles with possible trace bilateral pleural effusions. Pleural effusion. No pneumothorax. No acute osseous abnormality. IMPRESSION: 1. Right hilar prominence of underlying lymphadenopathy not excluded. 2. Right upper lung zone scarring with superimposed airspace opacity peripherally. 3. Aortic Atherosclerosis (ICD10-I70.0) and Emphysema (ICD10-J43.9). Electronically Signed   By: Tish Frederickson M.D.   On: 07/07/2023 20:37    Procedures .Critical Care  Performed by: Eber Hong, MD Authorized by: Eber Hong, MD   Critical care provider statement:    Critical care time (minutes):  45   Critical care time was exclusive of:  Separately billable procedures and treating other patients and teaching time   Critical care was necessary to treat or prevent imminent or life-threatening deterioration of the following conditions:  Respiratory failure   Critical care was time spent personally by me on the following activities:  Development of treatment plan with patient or surrogate, discussions with consultants, evaluation of patient's response to treatment, examination of patient, obtaining history from patient or surrogate, review of old charts, re-evaluation of patient's condition, pulse oximetry, ordering and review of radiographic studies, ordering and review of  laboratory studies and ordering and performing treatments and interventions   I assumed direction of critical care for this patient from another provider in my specialty: no     Care discussed with: admitting provider   Comments:           Medications Ordered in ED Medications  nitroGLYCERIN (NITROSTAT) SL tablet 0.4 mg (has no administration in time range)  albuterol (PROVENTIL) (2.5 MG/3ML) 0.083% nebulizer solution 10 mg (10 mg Nebulization New Bag/Given 07/07/23 2235)  ipratropium (ATROVENT) 0.02 % nebulizer solution (  Not Given 07/07/23 2244)  predniSONE (DELTASONE) tablet 40 mg (has no administration in time range)  furosemide (LASIX) injection 40 mg (has no administration in time range)  labetalol (NORMODYNE) injection 10 mg (has no administration in time range)  hydrALAZINE (APRESOLINE) injection 10 mg (10 mg Intravenous Given 07/07/23 1858)  ipratropium (ATROVENT) nebulizer solution 0.5 mg (0.5 mg Nebulization Given 07/07/23 2235)    ED Course/ Medical Decision Making/ A&P                                 Medical Decision Making Amount and/or Complexity of Data Reviewed Labs: ordered. Radiology: ordered. ECG/medicine tests: ordered.  Risk Prescription drug management. Decision regarding hospitalization.   This patient presents to the ED for concern of shortness of breath, this involves an extensive number of treatment options, and is a complaint that carries with it a high risk of complications and morbidity.  The differential diagnosis includes cancer, pulmonary embolism, anemia, worsening renal dysfunction, the patient has been seen by multiple specialist over time including urology, he had a MRSA positive urinary culture and penile discharge dating back several months ago, he has a history of malignant lung cancer and is followed by oncology this is right upper lobe primary cancer, most recent visit was in August 2024   Co morbidities that complicate the patient  evaluation  Lung cancer   Additional history obtained:  Additional history obtained from electronic medical record External records  from outside source obtained and reviewed including visits with nephrology, he has seen Dr. Marisue Humble with nephrology, hypertensive chronic kidney disease Follows with oncology, had most recent visit August 8 showing that he had a history of non-small cell lung cancer adenosquamous carcinoma diagnosed in March 2022, completed 2 years of immunotherapy but developed immunotherapy mediated vitiligo.  Had some worsening creatinine and swelling, this was discontinued at that visit   Lab Tests:  I Ordered, and personally interpreted labs.  The pertinent results include: No leukocytosis, renal function seems improved compared to prior, urinalysis without obvious infection, protein present   Imaging Studies ordered:  I ordered imaging studies including chest x-ray and CT scan of the chest I independently visualized and interpreted imaging which showed lots of emphysematous changes, possible infiltrate I agree with the radiologist interpretation   Cardiac Monitoring: / EKG:  The patient was maintained on a cardiac monitor.  I personally viewed and interpreted the cardiac monitored which showed an underlying rhythm of: Sinus tachycardia   Consultations Obtained:  I requested consultation with the hospitalist,  and discussed lab and imaging findings as well as pertinent plan - they recommend: Admission to the hospital   Problem List / ED Course / Critical interventions / Medication management  The patient has multiple different reasons to be symptomatic including hypertensive urgency, acute hypoxic respiratory failure likely secondary to COPD especially given the CT scan showing significant emphysematous disease, seems unlikely to be pulmonary embolism however with renal dysfunction cannot order angiogram tonight I ordered medication including albuterol  treatments, prednisone, Lasix for symptomatic hypoxia as well as supportive oxygen Reevaluation of the patient after these medicines showed that the patient improved I have reviewed the patients home medicines and have made adjustments as needed   Social Determinants of Health:  Critically ill   Test / Admission - Considered:  Admit to higher level of care         Final Clinical Impression(s) / ED Diagnoses Final diagnoses:  Acute hypoxic respiratory failure [facility]    Rx / DC Orders ED Discharge Orders     None         Eber Hong, MD 07/07/23 2328

## 2023-07-07 NOTE — ED Notes (Signed)
ED TO INPATIENT HANDOFF REPORT  ED Nurse Name and Phone #: Jacques Earthly Name/Age/Gender [name] 89 y.o. male Room/Bed: APA03/APA03  Code Status   Code Status: Not on file  Home/SNF/Other Home Patient oriented to: self, place, time, and situation Is this baseline? Yes   Triage Complete: Triage complete  Chief Complaint COPD exacerbation (HCC) [J44.1]  Triage Note Pt BIB EMS for complaints of bilateral lower leg swelling x 2 weeks. Per EMS pt has been sitting in a chair for 2 weeks. A nurse went by to check on pt today and advised her come to the ER. Pt lives with family. Pt Sats were 88% on RA and placed on 3l by EMS.    Allergies No Known Allergies  Level of Care/Admitting Diagnosis ED Disposition     ED Disposition  Admit   Condition  --   Comment  Hospital Area: [facility] [[id number]]  Level of Care: Telemetry [5]  Covid Evaluation: Asymptomatic - no recent exposure (last 10 days) testing not required  Diagnosis: COPD exacerbation [facility]) [[id number]]  Admitting Physician: Lilyan Gilford [[id number]]  Attending Physician: Lilyan Gilford [[id number]]  Certification:: I certify this patient will need inpatient services for at least 2 midnights  Expected Medical Readiness: 07/09/2023          B Medical/Surgery History Past Medical History:  Diagnosis Date   Arthritis    Hypertension    Hyperthyroidism    lung ca 11/2020   Past Surgical History:  Procedure Laterality Date   COLONOSCOPY N/A 09/02/2016   Procedure: COLONOSCOPY;  Surgeon: [name], MD;  [facility];  Service: Endoscopy;  Laterality: N/A;  830   NO PAST SURGERIES     POLYPECTOMY  09/02/2016   Procedure: POLYPECTOMY;  Surgeon: [name], MD;  Location: [facility];  Service: Endoscopy;;     A IV Location/Drains/Wounds Patient Lines/Drains/Airways Status     Active Line/Drains/Airways     Name Placement date Placement time Site Days    Peripheral IV 07/07/23 20 G Right Antecubital 07/07/23  1819  Antecubital  less than 1            Intake/Output Last 24 hours No intake or output data in the 24 hours ending 07/07/23 2356  Labs/Imaging Results for orders placed or performed during the hospital encounter of 07/07/23 (from the past 48 hour(s))  CBC with Differential     Status: Abnormal   Collection Time: 07/07/23  6:18 PM  Result Value Ref Range   WBC 5.3 4.0 - 10.5 K/uL   RBC 3.43 (L) 4.22 - 5.81 MIL/uL   Hemoglobin 10.2 (L) 13.0 - 17.0 g/dL   HCT 56.2 (L) 13.0 - 86.5 %   MCV 95.9 80.0 - 100.0 fL   MCH 29.7 26.0 - 34.0 pg   MCHC 31.0 30.0 - 36.0 g/dL   RDW 78.4 (H) 69.6 - 29.5 %   Platelets 167 150 - 400 K/uL   nRBC 0.0 0.0 - 0.2 %   Neutrophils Relative % 77 %   Neutro Abs 4.1 1.7 - 7.7 K/uL   Lymphocytes Relative 14 %   Lymphs Abs 0.8 0.7 - 4.0 K/uL   Monocytes Relative 6 %   Monocytes Absolute 0.3 0.1 - 1.0 K/uL   Eosinophils Relative 1 %   Eosinophils Absolute 0.1 0.0 - 0.5 K/uL   Basophils Relative 0 %   Basophils Absolute 0.0 0.0 - 0.1 K/uL   Immature Granulocytes  2 %   Abs Immature Granulocytes 0.09 (H) 0.00 - 0.07 K/uL    Comment: Performed at [facility], [address] [place], [place] [address]  Comprehensive metabolic panel     Status: Abnormal   Collection Time: 07/07/23  6:18 PM  Result Value Ref Range   Sodium 137 135 - 145 mmol/L   Potassium 3.9 3.5 - 5.1 mmol/L   Chloride 106 98 - 111 mmol/L   CO2 23 22 - 32 mmol/L   Glucose, Bld 98 70 - 99 mg/dL    Comment: Glucose reference range applies only to samples taken after fasting for at least 8 hours.   BUN 26 (H) 8 - 23 mg/dL   Creatinine, Ser 6.04 (H) 0.61 - 1.24 mg/dL   Calcium 8.0 (L) 8.9 - 10.3 mg/dL   Total Protein 7.3 6.5 - 8.1 g/dL   Albumin 2.7 (L) 3.5 - 5.0 g/dL   AST 12 (L) 15 - 41 U/L   ALT 11 0 - 44 U/L   Alkaline Phosphatase 67 38 - 126 U/L   Total Bilirubin 0.8 0.3 - 1.2 mg/dL   GFR, Estimated 31 (L) >60 mL/min     Comment: (NOTE) Calculated using the CKD-EPI Creatinine Equation (2021)    Anion gap 8 5 - 15    Comment: Performed at [facility], [address], [place], [place] [address]  Brain natriuretic peptide     Status: Abnormal   Collection Time: 07/07/23  6:18 PM  Result Value Ref Range   B Natriuretic Peptide 175.0 (H) 0.0 - 100.0 pg/mL    Comment: Performed at [facility], [address], [place], [place] [address]  Magnesium     Status: None   Collection Time: 07/07/23  6:18 PM  Result Value Ref Range   Magnesium 1.9 1.7 - 2.4 mg/dL    Comment: Performed at [facility], [address] [place], [place] [address]  Troponin I (High Sensitivity)     Status: None   Collection Time: 07/07/23  6:18 PM  Result Value Ref Range   Troponin I (High Sensitivity) 11 <18 ng/L    Comment: (NOTE) Elevated high sensitivity troponin I (hsTnI) values and significant  changes across serial measurements may suggest ACS but many other  chronic and acute conditions are known to elevate hsTnI results.  Refer to the "Links" section for chest pain algorithms and additional  guidance. Performed at [facility], [address] [place], [place] [address]   Urinalysis, Routine w reflex microscopic -Urine, Clean Catch     Status: Abnormal   Collection Time: 07/07/23  6:25 PM  Result Value Ref Range   Color, Urine YELLOW YELLOW   APPearance CLEAR CLEAR   Specific Gravity, Urine 1.009 1.005 - 1.030   pH 7.0 5.0 - 8.0   Glucose, UA NEGATIVE NEGATIVE mg/dL   Hgb urine dipstick MODERATE (A) NEGATIVE   Bilirubin Urine NEGATIVE NEGATIVE   Ketones, ur NEGATIVE NEGATIVE mg/dL   Protein, ur 865 (A) NEGATIVE mg/dL   Nitrite NEGATIVE NEGATIVE   Leukocytes,Ua TRACE (A) NEGATIVE   RBC / HPF 21-50 0 - 5 RBC/hpf   WBC, UA 11-20 0 - 5 WBC/hpf   Bacteria, UA NONE SEEN NONE SEEN   Squamous Epithelial / HPF 0-5 0 - 5 /HPF   Mucus PRESENT     Comment: Performed at [facility], [address], [place], [place] [address]    CT Chest Wo Contrast  Result Date: 07/07/2023 CLINICAL DATA:  Shortness  of breath, abnormal chest x-ray EXAM: CT CHEST WITHOUT CONTRAST TECHNIQUE: Multidetector CT imaging of the chest was performed following the standard protocol without IV contrast. RADIATION DOSE REDUCTION: This exam was performed according to the departmental dose-optimization program which includes automated exposure control, adjustment of the mA and/or kV according to patient size and/or use of iterative reconstruction technique. COMPARISON:  Radiograph 07/07/2023 head CT chest 04/29/2023 FINDINGS: Cardiovascular: Similar small pericardial effusion. Coronary artery and aortic atherosclerotic calcification. Mediastinum/Nodes: Trachea and esophagus are unremarkable. Evaluation for mediastinal and hilar lymphadenopathy is limited without IV contrast. Enlarged right paratracheal node measuring 1.2 cm on 2/60. Increased soft tissue thickening about the right hilum compared with 04/29/2023. There is mass effect on the right mainstem bronchus and lower lobe bronchi suggesting this may be due to mass/adenopathy rather than atelectasis (series 3/image 72 and 3/83). Question enlargement of the right thyroid lobe versus paraesophageal lymphadenopathy (series 2/image 28). Lungs/Pleura: Advanced emphysema. Diffuse bronchial wall thickening. Scattered mucous plugging. Right upper lobe volume loss, architectural distortion, and traction bronchiectasis has increased compared to 04/29/2023. This may be radiation induced though underlying mass is difficult to exclude. Upper Abdomen: Question bilateral hydronephrosis. This is incompletely evaluated as the kidneys are only partially visualized. There is new perinephric stranding about the right kidney. Wall thickening and stranding about the stomach. Musculoskeletal: No acute fracture. Absence of a portion of the third anterolateral right rib. Deformity of the anterolateral right fourth rib. IMPRESSION: 1.  Increased soft tissue thickening about the right hilum compared with 04/29/2023. There is mass effect on the right mainstem bronchus and lower lobe bronchi suggesting this may be due to mass/adenopathy rather than atelectasis. Consider further evaluation with contrast enhanced CT chest. 2. Right upper lobe volume loss, architectural distortion, and traction bronchiectasis has increased compared to 04/29/2023. This may be radiation induced though underlying mass is difficult to exclude. 3. Question enlargement of the right thyroid lobe versus paraesophageal lymphadenopathy. 4. Question bilateral hydronephrosis. This is incompletely evaluated as the kidneys are only partially visualized. There is new perinephric stranding about the right kidney. Consider further evaluation with CT abdomen pelvis with IV contrast. 5. Wall thickening and stranding about the partially visualized stomach. This is incompletely evaluated but may be due to gastritis. Consider further evaluation with CT abdomen and pelvis with IV contrast is recommended. Aortic Atherosclerosis (ICD10-I70.0) and Emphysema (ICD10-J43.9). Electronically Signed   By: Minerva Fester M.D.   On: 07/07/2023 23:46   DG Chest Port 1 View  Result Date: 07/07/2023 CLINICAL DATA:  sob. Non-small cell lung cancer. Right upper lobe primary. EXAM: PORTABLE CHEST 1 VIEW COMPARISON:  Chest x-ray 12/12/2020, CT chest 04/29/2023 FINDINGS: The heart and mediastinal contours are unchanged. Aortic calcification. Right hilar prominence. Right upper lung zone scarring with superimposed airspace opacity peripherally. Chronic coarsened interstitial markings with no overt pulmonary edema. Blunting of bilateral costophrenic angles with possible trace bilateral pleural effusions. Pleural effusion. No pneumothorax. No acute osseous abnormality. IMPRESSION: 1. Right hilar prominence of underlying lymphadenopathy not excluded. 2. Right upper lung zone scarring with superimposed airspace  opacity peripherally. 3. Aortic Atherosclerosis (ICD10-I70.0) and Emphysema (ICD10-J43.9). Electronically Signed   By: Tish Frederickson M.D.   On: 07/07/2023 20:37    Pending Labs Unresulted Labs (From admission, onward)     Start     Ordered   07/07/23 2349  Blood gas, venous  Once,   R        07/07/23 2349   07/07/23 2349  Procalcitonin  Once,  R       References:    Procalcitonin Lower Respiratory Tract Infection AND Sepsis Procalcitonin Algorithm   07/07/23 2349            Vitals/Pain Today's Vitals   07/07/23 2230 07/07/23 2300 07/07/23 2330 07/07/23 2336  BP: (!) 167/94 (!) 168/90 (!) 159/96   Pulse: 96 (!) 101 (!) 113   Resp: 13 (!) 21 19   Temp:      TempSrc:      SpO2: 98% 100% 98%   Weight:      Height:      PainSc:    0-No pain    Isolation Precautions No active isolations  Medications Medications  nitroGLYCERIN (NITROSTAT) SL tablet 0.4 mg (has no administration in time range)  albuterol (PROVENTIL) (2.5 MG/3ML) 0.083% nebulizer solution 10 mg (10 mg Nebulization New Bag/Given 07/07/23 2235)  ipratropium (ATROVENT) 0.02 % nebulizer solution (  Not Given 07/07/23 2244)  hydrALAZINE (APRESOLINE) injection 10 mg (10 mg Intravenous Given 07/07/23 1858)  ipratropium (ATROVENT) nebulizer solution 0.5 mg (0.5 mg Nebulization Given 07/07/23 2235)  predniSONE (DELTASONE) tablet 40 mg (40 mg Oral Given 07/07/23 2328)  furosemide (LASIX) injection 40 mg (40 mg Intravenous Given 07/07/23 2331)  labetalol (NORMODYNE) injection 10 mg (10 mg Intravenous Given 07/07/23 2333)    Mobility non-ambulatory     Focused Assessments    R Recommendations: See Admitting Provider Note  Report given to:   Additional Notes: A&O; 3L via [place](none at baseline); 20G RAC

## 2023-07-07 NOTE — ED Triage Notes (Signed)
Pt BIB EMS for complaints of bilateral lower leg swelling x 2 weeks. Per EMS pt has been sitting in a chair for 2 weeks. A nurse went by to check on pt today and advised her come to the ER. Pt lives with family. Pt Sats were 88% on RA and placed on 3l by EMS.

## 2023-07-08 ENCOUNTER — Inpatient Hospital Stay (HOSPITAL_COMMUNITY): Payer: Medicare HMO

## 2023-07-08 DIAGNOSIS — R600 Localized edema: Secondary | ICD-10-CM | POA: Insufficient documentation

## 2023-07-08 DIAGNOSIS — C3411 Malignant neoplasm of upper lobe, right bronchus or lung: Secondary | ICD-10-CM

## 2023-07-08 DIAGNOSIS — I10 Essential (primary) hypertension: Secondary | ICD-10-CM | POA: Diagnosis not present

## 2023-07-08 DIAGNOSIS — I169 Hypertensive crisis, unspecified: Secondary | ICD-10-CM | POA: Insufficient documentation

## 2023-07-08 DIAGNOSIS — D638 Anemia in other chronic diseases classified elsewhere: Secondary | ICD-10-CM

## 2023-07-08 DIAGNOSIS — E010 Iodine-deficiency related diffuse (endemic) goiter: Secondary | ICD-10-CM | POA: Insufficient documentation

## 2023-07-08 DIAGNOSIS — J9601 Acute respiratory failure with hypoxia: Secondary | ICD-10-CM | POA: Diagnosis not present

## 2023-07-08 LAB — RETICULOCYTES
Immature Retic Fract: 25.4 % — ABNORMAL HIGH (ref 2.3–15.9)
RBC.: 2.91 MIL/uL — ABNORMAL LOW (ref 4.22–5.81)
Retic Count, Absolute: 77.1 10*3/uL (ref 19.0–186.0)
Retic Ct Pct: 2.7 % (ref 0.4–3.1)

## 2023-07-08 LAB — MAGNESIUM: Magnesium: 1.9 mg/dL (ref 1.7–2.4)

## 2023-07-08 LAB — TSH: TSH: 9.279 u[IU]/mL — ABNORMAL HIGH (ref 0.350–4.500)

## 2023-07-08 LAB — BLOOD GAS, VENOUS
Acid-Base Excess: 1.2 mmol/L (ref 0.0–2.0)
Bicarbonate: 24.9 mmol/L (ref 20.0–28.0)
Drawn by: 53361
O2 Saturation: 88.4 %
Patient temperature: 36.9
pCO2, Ven: 35 mm[Hg] — ABNORMAL LOW (ref 44–60)
pH, Ven: 7.46 — ABNORMAL HIGH (ref 7.25–7.43)
pO2, Ven: 53 mm[Hg] — ABNORMAL HIGH (ref 32–45)

## 2023-07-08 LAB — CBC WITH DIFFERENTIAL/PLATELET
Abs Immature Granulocytes: 0.03 10*3/uL (ref 0.00–0.07)
Basophils Absolute: 0 10*3/uL (ref 0.0–0.1)
Basophils Relative: 0 %
Eosinophils Absolute: 0 10*3/uL (ref 0.0–0.5)
Eosinophils Relative: 0 %
HCT: 28.9 % — ABNORMAL LOW (ref 39.0–52.0)
Hemoglobin: 8.8 g/dL — ABNORMAL LOW (ref 13.0–17.0)
Immature Granulocytes: 1 %
Lymphocytes Relative: 5 %
Lymphs Abs: 0.2 10*3/uL — ABNORMAL LOW (ref 0.7–4.0)
MCH: 29.1 pg (ref 26.0–34.0)
MCHC: 30.4 g/dL (ref 30.0–36.0)
MCV: 95.7 fL (ref 80.0–100.0)
Monocytes Absolute: 0.1 10*3/uL (ref 0.1–1.0)
Monocytes Relative: 1 %
Neutro Abs: 4.1 10*3/uL (ref 1.7–7.7)
Neutrophils Relative %: 93 %
Platelets: 164 10*3/uL (ref 150–400)
RBC: 3.02 MIL/uL — ABNORMAL LOW (ref 4.22–5.81)
RDW: 15.9 % — ABNORMAL HIGH (ref 11.5–15.5)
WBC: 4.4 10*3/uL (ref 4.0–10.5)
nRBC: 0 % (ref 0.0–0.2)

## 2023-07-08 LAB — VITAMIN B12: Vitamin B-12: 479 pg/mL (ref 180–914)

## 2023-07-08 LAB — PROCALCITONIN: Procalcitonin: 0.11 ng/mL

## 2023-07-08 LAB — COMPREHENSIVE METABOLIC PANEL
ALT: 9 U/L (ref 0–44)
AST: 12 U/L — ABNORMAL LOW (ref 15–41)
Albumin: 2.2 g/dL — ABNORMAL LOW (ref 3.5–5.0)
Alkaline Phosphatase: 58 U/L (ref 38–126)
Anion gap: 6 (ref 5–15)
BUN: 26 mg/dL — ABNORMAL HIGH (ref 8–23)
CO2: 22 mmol/L (ref 22–32)
Calcium: 7.8 mg/dL — ABNORMAL LOW (ref 8.9–10.3)
Chloride: 107 mmol/L (ref 98–111)
Creatinine, Ser: 1.91 mg/dL — ABNORMAL HIGH (ref 0.61–1.24)
GFR, Estimated: 34 mL/min — ABNORMAL LOW (ref >60)
Glucose, Bld: 124 mg/dL — ABNORMAL HIGH (ref 70–99)
Potassium: 3.7 mmol/L (ref 3.5–5.1)
Sodium: 135 mmol/L (ref 135–145)
Total Bilirubin: 0.8 mg/dL (ref 0.3–1.2)
Total Protein: 6.1 g/dL — ABNORMAL LOW (ref 6.5–8.1)

## 2023-07-08 LAB — IRON AND TIBC
Iron: 21 ug/dL — ABNORMAL LOW (ref 45–182)
Saturation Ratios: 13 % — ABNORMAL LOW (ref 17.9–39.5)
TIBC: 168 ug/dL — ABNORMAL LOW (ref 250–450)
UIBC: 147 ug/dL

## 2023-07-08 LAB — FOLATE: Folate: 7.5 ng/mL (ref >5.9)

## 2023-07-08 LAB — FERRITIN: Ferritin: 164 ng/mL (ref 24–336)

## 2023-07-08 MED ORDER — OXYCODONE HCL 5 MG PO TABS: 5.0000 mg | ORAL_TABLET | ORAL | Status: DC | PRN

## 2023-07-08 MED ORDER — ONDANSETRON HCL 4 MG PO TABS
4.0000 mg | ORAL_TABLET | Freq: Four times a day (QID) | ORAL | Status: DC | PRN
  Filled 2023-07-08: qty 1

## 2023-07-08 MED ORDER — LABETALOL HCL 200 MG PO TABS
200.0000 mg | ORAL_TABLET | Freq: Two times a day (BID) | ORAL | Status: DC
  Administered 2023-07-08 – 2023-07-16 (×18): 200 mg via ORAL
  Filled 2023-07-08 (×19): qty 1

## 2023-07-08 MED ORDER — HEPARIN SODIUM (PORCINE) 5000 UNIT/ML IJ SOLN
5000.0000 [IU] | Freq: Three times a day (TID) | INTRAMUSCULAR | Status: DC
  Administered 2023-07-08 – 2023-07-16 (×24): 5000 [IU] via SUBCUTANEOUS
  Filled 2023-07-08 (×22): qty 1

## 2023-07-08 MED ORDER — IPRATROPIUM-ALBUTEROL 0.5-2.5 (3) MG/3ML IN SOLN
3.0000 mL | Freq: Three times a day (TID) | RESPIRATORY_TRACT | Status: DC
  Administered 2023-07-09 – 2023-07-11 (×9): 3 mL via RESPIRATORY_TRACT
  Filled 2023-07-08 (×8): qty 3

## 2023-07-08 MED ORDER — FLUTICASONE FUROATE-VILANTEROL 100-25 MCG/ACT IN AEPB
1.0000 | INHALATION_SPRAY | Freq: Every day | RESPIRATORY_TRACT | Status: DC
  Administered 2023-07-08 – 2023-07-16 (×9): 1 via RESPIRATORY_TRACT
  Filled 2023-07-08: qty 28

## 2023-07-08 MED ORDER — MORPHINE SULFATE (PF) 2 MG/ML IV SOLN
2.0000 mg | INTRAVENOUS | Status: DC | PRN
  Administered 2023-07-09: 2 mg via INTRAVENOUS
  Filled 2023-07-08: qty 1

## 2023-07-08 MED ORDER — METHYLPREDNISOLONE SODIUM SUCC 125 MG IJ SOLR
125.0000 mg | Freq: Two times a day (BID) | INTRAMUSCULAR | Status: AC
  Administered 2023-07-08 (×2): 125 mg via INTRAVENOUS
  Filled 2023-07-08 (×2): qty 2

## 2023-07-08 MED ORDER — ONDANSETRON HCL 4 MG/2ML IJ SOLN: 4.0000 mg | Freq: Four times a day (QID) | INTRAMUSCULAR | Status: DC | PRN

## 2023-07-08 MED ORDER — LEVOTHYROXINE SODIUM 75 MCG PO TABS
150.0000 ug | ORAL_TABLET | Freq: Every day | ORAL | Status: DC
  Administered 2023-07-08 – 2023-07-16 (×9): 150 ug via ORAL
  Filled 2023-07-08 (×9): qty 2

## 2023-07-08 MED ORDER — IPRATROPIUM-ALBUTEROL 0.5-2.5 (3) MG/3ML IN SOLN
3.0000 mL | Freq: Four times a day (QID) | RESPIRATORY_TRACT | Status: DC
  Administered 2023-07-08 (×4): 3 mL via RESPIRATORY_TRACT
  Filled 2023-07-08 (×4): qty 3

## 2023-07-08 MED ORDER — ACETAMINOPHEN 325 MG PO TABS: 650.0000 mg | ORAL_TABLET | Freq: Four times a day (QID) | ORAL | Status: DC | PRN

## 2023-07-08 MED ORDER — PREDNISONE 20 MG PO TABS
40.0000 mg | ORAL_TABLET | Freq: Every day | ORAL | Status: DC
  Administered 2023-07-09 – 2023-07-10 (×2): 40 mg via ORAL
  Filled 2023-07-08 (×2): qty 2

## 2023-07-08 MED ORDER — ACETAMINOPHEN 650 MG RE SUPP: 650.0000 mg | Freq: Four times a day (QID) | RECTAL | Status: DC | PRN

## 2023-07-08 MED ORDER — ALBUTEROL SULFATE (2.5 MG/3ML) 0.083% IN NEBU
2.5000 mg | INHALATION_SOLUTION | RESPIRATORY_TRACT | Status: DC | PRN
  Administered 2023-07-14: 2.5 mg via RESPIRATORY_TRACT
  Filled 2023-07-08: qty 3

## 2023-07-08 MED ORDER — FUROSEMIDE 10 MG/ML IJ SOLN
20.0000 mg | Freq: Every day | INTRAMUSCULAR | Status: DC
  Administered 2023-07-10: 20 mg via INTRAVENOUS
  Filled 2023-07-08 (×2): qty 2

## 2023-07-08 NOTE — Assessment & Plan Note (Deleted)
-   Without any pulmonary edema, or other signs of fluid overload on CT - Possibly related to inactivity and Norvasc - Holding Norvasc - Patient was on hydrochlorothiazide, but is not anymore and this could also be contributing to peripheral edema - Lasix given in the ED -Peripheral edema has improved - Continue to monitor

## 2023-07-08 NOTE — H&P (Signed)
History and Physical    Patient: [name] NWG:[id number] DOB: 11/03/1932 DOA: 07/07/2023 DOS: the patient was seen and examined on 07/08/2023 PCP: [name], MD  Patient coming from: Home  Chief Complaint:  Chief Complaint  Patient presents with   Leg Swelling   HPI: [name] is a 89 y.o. 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  Patient reports that he has had bilateral peripheral edema for 2 weeks.  It was reported that he had been way less active than normal for the last 2 weeks.  We have been sitting in his chair, as he was extremely short of breath with any exertion at all.  Patient reports that he has not been coughing, has not had a fever.  He denies any chest pain, palpitations, dizziness.  He reports he wears no oxygen at home.  He has been feeling more short of breath over the last 6 months but it has been worse over the last 1 week.  He reports his swelling in his legs has been present for a long time as well, but has been worse over the last 2 weeks.  He denies any missed medications or history of rebound hypertension.  He reports an increase in urine output.  He does feel better since having the oxygen on.  On review of systems he denies nausea, vomiting, fever, dysuria.  He reports he has had some diarrhea.  He denies hematochezia and melena.  Patient denies any obvious signs of bleeding.  He has no other complaints at this time.  Patient is a former smoker.  He does not drink.  Patient reports he would want to be full code. Review of Systems: As mentioned in the history of present illness. All other systems reviewed and are negative. Past Medical History:  Diagnosis Date   Arthritis    Hypertension    Hyperthyroidism    lung ca 11/2020   Past Surgical History:  Procedure Laterality Date   COLONOSCOPY N/A  09/02/2016   Procedure: COLONOSCOPY;  Surgeon: [name], MD;  [facility];  Service: Endoscopy;  Laterality: N/A;  830   NO PAST SURGERIES     POLYPECTOMY  09/02/2016   Procedure: POLYPECTOMY;  Surgeon: [name], MD;  [facility];  Service: Endoscopy;;   Social History:  reports that he quit smoking about 24 years ago. His smoking use included cigarettes. He started smoking about 64 years ago. He has a 60 pack-year smoking history. He has never used smokeless tobacco. He reports that he does not drink alcohol and does not use drugs.  No Known Allergies  Family History  Problem Relation Age of Onset   Colon cancer Other     Prior to Admission medications   Medication Sig Start Date End Date Taking? Authorizing Provider  amLODipine (NORVASC) 10 MG tablet Take 10 mg by mouth daily as needed. 01/16/22  Yes [provider]  diclofenac Sodium (VOLTAREN) 1 % GEL APPLY 2 GRAMS TO THE AFFECTED AREA(S) BY TOPICAL ROUTE 2 TIMES PER DAY 10/13/22  Yes [provider]  ferrous sulfate 325 (65 FE) MG EC tablet Take 325 mg by mouth daily with breakfast.   Yes [provider]  gabapentin (NEURONTIN) 250 MG/5ML solution Take 3 mLs (150 mg total) by mouth at bedtime. Patient taking differently: Take 150 mg by  mouth daily as needed. 01/13/21  Yes Tanner, Kathrin Greathouse., PA-C  ibuprofen (ADVIL) 200 MG tablet Take 200 mg by mouth every 6 (six) hours as needed for mild pain.   Yes [provider]  levothyroxine (SYNTHROID) 150 MCG tablet Take 150 mcg by mouth daily. 06/06/22  Yes [provider]  loperamide (IMODIUM) 2 MG capsule Take by mouth as needed for diarrhea or loose stools.   Yes [provider]  tadalafil (CIALIS) 5 MG tablet Take 5 mg by mouth daily.   Yes [provider]  tamsulosin (FLOMAX) 0.4 MG CAPS capsule Take 0.4 mg by mouth daily.   Yes [provider]  hydrochlorothiazide (MICROZIDE) 12.5 MG  capsule Take 12.5 mg by mouth daily. Patient not taking: Reported on 07/07/2023 06/23/23   [provider]  labetalol (NORMODYNE) 200 MG tablet Take 200 mg by mouth 2 (two) times daily. Patient not taking: Reported on 07/07/2023 04/26/23   [provider]    Physical Exam: Vitals:   07/08/23 0000 07/08/23 0052 07/08/23 0211 07/08/23 0540  BP: (!) 169/88 (!) 172/94  (!) 141/81  Pulse: 87 96  75  Resp: 19 20  18   Temp:  98.3 F (36.8 C)  98.3 F (36.8 C)  TempSrc:      SpO2: 97% 94% 97% 100%  Weight:  74.4 kg    Height:  6\' 2"  (1.88 m)     1.  General: Patient lying supine in bed,  no acute distress   2. Psychiatric: Alert and oriented x 3, mood and behavior normal for situation, pleasant and cooperative with exam   3. Neurologic: Speech and language are normal, face is symmetric, moves all 4 extremities voluntarily, at baseline without acute deficits on limited exam   4. HEENMT:  Head is atraumatic, normocephalic, pupils reactive to light, neck is supple, trachea is midline, mucous membranes are moist   5. Respiratory : Lungs are diminished throughout without wheezing, rhonchi, rales,, no cyanosis, no increase in work of breathing or accessory muscle use, speaking in full sentences on 3 L nasal cannula   6. Cardiovascular : Heart rate normal, rhythm is regular, no murmurs, rubs or gallops, peripheral edema with venous stasis changes of the skin, peripheral pulses palpated   7. Gastrointestinal:  Abdomen is soft, nondistended, nontender to palpation bowel sounds active, no masses or organomegaly palpated   8. Skin:  Skin is warm, dry and intact without rashes, acute lesions, or ulcers on limited exam, venous stasis changes of the skin in the lower extremities-chronic   9.Musculoskeletal:  No acute deformities or trauma, no asymmetry in tone, positive for peripheral edema, peripheral pulses palpated, no tenderness to palpation in the extremities  Data  Reviewed: In the ED Patient is afebrile, tachycardic, tachypneic, blood pressure 159/90-230/109  Oxygen sats as low as 85% on room air but normalized on 3 L nasal cannula  No leukocytosis Hemoglobin trending down from 10.2>> 8.8 Chemistry reveals an elevated creatinine that is close to patient's baseline at 2.04 BNP 175 Troponin 11 CT was done that shows mass effect from the malignancy in the right lung but also has some abnormal findings in the part of the abdomen is visualized.  CT recommends CT with contrast of abdomen and pelvis.  With patient's creatinine being above 2 he is not going to be able to get contrast, adding on retroperitoneal ultrasound to evaluate renal findings from CT Patient was treated for COPD Admission requested for acute respiratory failure with hypoxia  Assessment and Plan: * Acute respiratory failure with hypoxia (HCC) - Likely multifactorial - COPD could be contributing, continue nebulizers, steroid - No pneumonia on x-ray or CT, procalcitonin not indicative of bacterial infection - No antibiotics at this time - Troponin 11, with no chest pain - EKG appears a sinus tach with a right bundle branch block - Right sided lung mass with mass effect involving right mainstem bronchus also contributing - Continue O2 supplementation as needed - Wean off O2 as tolerated - Continue to monitor  Hypertensive crisis - Reports no missed doses of Norvasc that would cause rebound hypertension - Resuming labetalol given patient's tachycardia - Blood pressure as high as 230/109 - Hydralazine given in the ED - As needed nitro for any chest pain - No pulmonary edema on imaging - Patient does have hypoxia, peripheral edema - Consider adding on as needed medication if blood pressure is not controlled - Last blood pressure 140s over 80s  Peripheral edema - Without any pulmonary edema, or other signs of fluid overload on CT - Possibly related to inactivity and Norvasc -  Holding Norvasc - Patient was on hydrochlorothiazide, but is not anymore and this could also be contributing to peripheral edema - Lasix given in the ED -Peripheral edema has improved - Continue to monitor  Thyromegaly - CT chest showing possible thyromegaly - Check TSH - Continue Synthroid  Hypothyroidism - See plan for thyromegaly - Continue Synthroid - Check TSH  Essential hypertension - Holding Norvasc - Restart labetalol given patient's tachycardia and significant hypertension - Continue to monitor  Anemia of chronic disease - Hemoglobin dropped from 10.2>> 8.8 - Add on anemia panel - Continue to monitor for signs of bleeding  Primary cancer of right upper lobe of lung (HCC) - CTA showing increased soft tissue thickening in the right hilum with mass effect on the right mainstem bronchus and lower lobe bronchi - Patient is no longer on immunotherapy - Likely contributing to his hypoxia - Continue oxygen supplementation as needed - Consider consult to oncology      Advance Care Planning:   Code Status: Full Code  Consults: None at this time, consider oncology consult  Family Communication: No family at bedside  Severity of Illness: The appropriate patient status for this patient is INPATIENT. Inpatient status is judged to be reasonable and necessary in order to provide the required intensity of service to ensure the patient's safety. The patient's presenting symptoms, physical exam findings, and initial radiographic and laboratory data in the context of their chronic comorbidities is felt to place them at high risk for further clinical deterioration. Furthermore, it is not anticipated that the patient will be medically stable for discharge from the hospital within 2 midnights of admission.   * I certify that at the point of admission it is my clinical judgment that the patient will require inpatient hospital care spanning beyond 2 midnights from the point of admission  due to high intensity of service, high risk for further deterioration and high frequency of surveillance required.*  Author: Lilyan Gilford, DO 07/08/2023 6:12 AM  For on call review [email or web address].

## 2023-07-08 NOTE — Assessment & Plan Note (Addendum)
Continue levothyroxine.  Possible thyromegaly.

## 2023-07-08 NOTE — Assessment & Plan Note (Addendum)
Continue blood pressure control with labetalol.

## 2023-07-08 NOTE — Assessment & Plan Note (Deleted)
-   Reports no missed doses of Norvasc that would cause rebound hypertension - Resuming labetalol given patient's tachycardia - Blood pressure as high as 230/109 - Hydralazine given in the ED - As needed nitro for any chest pain - No pulmonary edema on imaging - Patient does have hypoxia, peripheral edema - Consider adding on as needed medication if blood pressure is not controlled - Last blood pressure 140s over 80s

## 2023-07-08 NOTE — Assessment & Plan Note (Addendum)
Iron deficiency with serum iron at 21, with TIBC 168, transferrin saturation 13 and ferritin 164,   Sp IV iron 300 mg iron sucrose.

## 2023-07-08 NOTE — Progress Notes (Signed)
Patient seen and examined; admitted after midnight secondary to leg swelling, increased shortness of breath, dyspnea on exertion and oxygen requirement.  Workup demonstrating worsening right lung malignancy, vascular congestion, atelectatic changes and per physical examination at time of admission concerns for complaining of COPD exacerbation.  Please refer to H&P written by Dr. Carren Rang for further info/details at time of admission.  Plan: -Continue addressing multiple problems to the base of her work capabilities; IV steroids, bronchodilators, IV diuresis and the use of flutter valve/incentive spirometer. -Continue oxygen supplementation and wean off as tolerated -Follow clinical response -Outpatient follow-up with oncology service recommended. -Low-sodium diet discussed with patient and family.  Vassie Loll MD [phone]

## 2023-07-08 NOTE — Progress Notes (Signed)
   07/08/23 1330  TOC Brief Assessment  Insurance and Status Reviewed  Patient has primary care physician Yes  Home environment has been reviewed Yes  Prior level of function: Independent  Prior/Current Home Services No current home services  Social Determinants of Health Reivew SDOH reviewed no interventions necessary  Readmission risk has been reviewed Yes  Transition of care needs transition of care needs identified, TOC will continue to follow   Pt may need home O2. TOC will follow.

## 2023-07-08 NOTE — Assessment & Plan Note (Deleted)
-   CT chest showing possible thyromegaly - Check TSH - Continue Synthroid

## 2023-07-08 NOTE — Assessment & Plan Note (Addendum)
-   CTA showing increased soft tissue thickening in the right hilum with mass effect on the right mainstem bronchus and lower lobe bronchi - Patient is no longer on immunotherapy - Likely contributing to his hypoxia - Continue oxygen supplementation as needed

## 2023-07-08 NOTE — Assessment & Plan Note (Deleted)
-   Likely multifactorial - COPD could be contributing, continue nebulizers, steroid - No pneumonia on x-ray or CT, procalcitonin not indicative of bacterial infection - No antibiotics at this time - Troponin 11, with no chest pain - EKG appears a sinus tach with a right bundle branch block - Right sided lung mass with mass effect involving right mainstem bronchus also contributing - Continue O2 supplementation as needed - Wean off O2 as tolerated - Continue to monitor

## 2023-07-08 NOTE — Plan of Care (Signed)
  Problem: Education: Goal: Knowledge of General Education information will improve Description Including pain rating scale, medication(s)/side effects and non-pharmacologic comfort measures Outcome: Progressing   

## 2023-07-09 ENCOUNTER — Other Ambulatory Visit (HOSPITAL_COMMUNITY): Payer: Medicare HMO

## 2023-07-09 ENCOUNTER — Inpatient Hospital Stay (HOSPITAL_COMMUNITY): Payer: Medicare HMO

## 2023-07-09 DIAGNOSIS — J9601 Acute respiratory failure with hypoxia: Secondary | ICD-10-CM | POA: Diagnosis not present

## 2023-07-09 DIAGNOSIS — N179 Acute kidney failure, unspecified: Secondary | ICD-10-CM

## 2023-07-09 DIAGNOSIS — J441 Chronic obstructive pulmonary disease with (acute) exacerbation: Secondary | ICD-10-CM

## 2023-07-09 DIAGNOSIS — N35919 Unspecified urethral stricture, male, unspecified site: Secondary | ICD-10-CM

## 2023-07-09 DIAGNOSIS — E010 Iodine-deficiency related diffuse (endemic) goiter: Secondary | ICD-10-CM | POA: Diagnosis not present

## 2023-07-09 DIAGNOSIS — R0609 Other forms of dyspnea: Secondary | ICD-10-CM

## 2023-07-09 DIAGNOSIS — N401 Enlarged prostate with lower urinary tract symptoms: Secondary | ICD-10-CM

## 2023-07-09 DIAGNOSIS — I169 Hypertensive crisis, unspecified: Secondary | ICD-10-CM | POA: Diagnosis not present

## 2023-07-09 DIAGNOSIS — N1832 Chronic kidney disease, stage 3b: Secondary | ICD-10-CM

## 2023-07-09 LAB — BASIC METABOLIC PANEL
Anion gap: 6 (ref 5–15)
BUN: 32 mg/dL — ABNORMAL HIGH (ref 8–23)
CO2: 24 mmol/L (ref 22–32)
Calcium: 7.9 mg/dL — ABNORMAL LOW (ref 8.9–10.3)
Chloride: 107 mmol/L (ref 98–111)
Creatinine, Ser: 2.08 mg/dL — ABNORMAL HIGH (ref 0.61–1.24)
GFR, Estimated: 30 mL/min — ABNORMAL LOW (ref >60)
Glucose, Bld: 134 mg/dL — ABNORMAL HIGH (ref 70–99)
Potassium: 4.1 mmol/L (ref 3.5–5.1)
Sodium: 137 mmol/L (ref 135–145)

## 2023-07-09 LAB — ECHOCARDIOGRAM COMPLETE
Est EF: >75
Height: 74 in
MV M vel: 4.19 m/s
MV Peak grad: 70.2 mm[Hg]
S' Lateral: 2 cm
Weight: 2624.36 [oz_av]

## 2023-07-09 MED ORDER — CHLORHEXIDINE GLUCONATE CLOTH 2 % EX PADS
6.0000 | MEDICATED_PAD | Freq: Every day | CUTANEOUS | Status: DC
  Administered 2023-07-10 – 2023-07-16 (×7): 6 via TOPICAL

## 2023-07-09 MED ORDER — PANTOPRAZOLE SODIUM 40 MG PO TBEC
40.0000 mg | DELAYED_RELEASE_TABLET | Freq: Every day | ORAL | Status: DC
  Administered 2023-07-09 – 2023-07-16 (×8): 40 mg via ORAL
  Filled 2023-07-09 (×8): qty 1

## 2023-07-09 MED ORDER — ZINC OXIDE 40 % EX OINT
TOPICAL_OINTMENT | Freq: Every day | CUTANEOUS | Status: DC | PRN
  Filled 2023-07-09: qty 57

## 2023-07-09 NOTE — Progress Notes (Signed)
Nursing staff attempted to IN/OUT cath per order.. there was a noticeable blockage which appeared to be transparent skin over  the in the urethral meatus. Charge nurse notified and attending was secured messed.. urologist consult STAT was ordered. Pt sitting in bed and able to make needs known. Awaiting new orders.

## 2023-07-09 NOTE — Progress Notes (Signed)
6433 Patient having ECHO done, [name] unavailable for nebulizer treatment at this time. 0830 RN in with patient. 2951 RN still in with patient.

## 2023-07-09 NOTE — Consult Note (Signed)
Urology Consult  Referring physician: Dr. Gwenlyn Perking Reason for referral: Urinary retention, difficult foley placement  Chief Complaint: Urinary retention  History of Present Illness: [name] is a 89yo with a history of BPH and difficulty urinating admitted with peripheral edema and difficulty urinating. During his admission he developed an inability to urinate. Multiple attempts were made to place a foley which were unsuccessful. He has been on flomax0.4mg  daily for over 5 years. He has a history of meatal stenosis but has not required dilation. He has a history of prostate cancer treated with IMRT in 2016  Past Medical History:  Diagnosis Date   Arthritis    Hypertension    Hyperthyroidism    lung ca 11/2020   Past Surgical History:  Procedure Laterality Date   COLONOSCOPY N/A 09/02/2016   Procedure: COLONOSCOPY;  Surgeon: [name], MD;  [facility];  Service: Endoscopy;  Laterality: N/A;  830   NO PAST SURGERIES     POLYPECTOMY  09/02/2016   Procedure: POLYPECTOMY;  Surgeon: [name], MD;  Location: [facility];  Service: Endoscopy;;    Medications: I have reviewed the patient's current medications. Allergies: No Known Allergies  Family History  Problem Relation Age of Onset   Colon cancer Other    Social History:  reports that he quit smoking about 24 years ago. His smoking use included cigarettes. He started smoking about 64 years ago. He has a 60 pack-year smoking history. He has never used smokeless tobacco. He reports that he does not drink alcohol and does not use drugs.  Review of Systems  All other systems reviewed and are negative.   Physical Exam:  Vital signs in last 24 hours: Temp:  [97.8 F (36.6 C)-98.8 F (37.1 C)] 98.8 F (37.1 C) (10/11 1339) Pulse Rate:  [80-82] 80 (10/11 1339) Resp:  [18] 18 (10/11 1339) BP: (151-171)/(86-93) 171/93 (10/11 1339) SpO2:  [91 %-98 %] 93 % (10/11 2001) Physical Exam Vitals reviewed.   Constitutional:      Appearance: Normal appearance.  HENT:     Head: Normocephalic and atraumatic.     Nose: Nose normal. No congestion.     Mouth/Throat:     Mouth: Mucous membranes are dry.  Eyes:     Extraocular Movements: Extraocular movements intact.     Pupils: Pupils are equal, round, and reactive to light.  Cardiovascular:     Rate and Rhythm: Normal rate and regular rhythm.  Pulmonary:     Effort: Pulmonary effort is normal. No respiratory distress.  Abdominal:     General: Abdomen is flat.     Palpations: Abdomen is soft.  Genitourinary:    Penis: Circumcised.      Testes: Normal.     Comments: Meatal stenosis present Musculoskeletal:        General: No swelling. Normal range of motion.     Cervical back: Normal range of motion and neck supple.  Skin:    General: Skin is warm and dry.  Neurological:     General: No focal deficit present.     Mental Status: He is alert and oriented to person, place, and time.  Psychiatric:        Mood and Affect: Mood normal.        Behavior: Behavior normal.        Thought Content: Thought content normal.        Judgment: Judgment normal.     Laboratory Data:  Results for orders placed  or performed during the hospital encounter of 07/07/23 (from the past 72 hour(s))  CBC with Differential     Status: Abnormal   Collection Time: 07/07/23  6:18 PM  Result Value Ref Range   WBC 5.3 4.0 - 10.5 K/uL   RBC 3.43 (L) 4.22 - 5.81 MIL/uL   Hemoglobin 10.2 (L) 13.0 - 17.0 g/dL   HCT 16.1 (L) 09.6 - 04.5 %   MCV 95.9 80.0 - 100.0 fL   MCH 29.7 26.0 - 34.0 pg   MCHC 31.0 30.0 - 36.0 g/dL   RDW 40.9 (H) 81.1 - 91.4 %   Platelets 167 150 - 400 K/uL   nRBC 0.0 0.0 - 0.2 %   Neutrophils Relative % 77 %   Neutro Abs 4.1 1.7 - 7.7 K/uL   Lymphocytes Relative 14 %   Lymphs Abs 0.8 0.7 - 4.0 K/uL   Monocytes Relative 6 %   Monocytes Absolute 0.3 0.1 - 1.0 K/uL   Eosinophils Relative 1 %   Eosinophils Absolute 0.1 0.0 - 0.5 K/uL    Basophils Relative 0 %   Basophils Absolute 0.0 0.0 - 0.1 K/uL   Immature Granulocytes 2 %   Abs Immature Granulocytes 0.09 (H) 0.00 - 0.07 K/uL    Comment: Performed at [facility], [address] [place], [place] [address]  Comprehensive metabolic panel     Status: Abnormal   Collection Time: 07/07/23  6:18 PM  Result Value Ref Range   Sodium 137 135 - 145 mmol/L   Potassium 3.9 3.5 - 5.1 mmol/L   Chloride 106 98 - 111 mmol/L   CO2 23 22 - 32 mmol/L   Glucose, Bld 98 70 - 99 mg/dL    Comment: Glucose reference range applies only to samples taken after fasting for at least 8 hours.   BUN 26 (H) 8 - 23 mg/dL   Creatinine, Ser 6.21 (H) 0.61 - 1.24 mg/dL   Calcium 8.0 (L) 8.9 - 10.3 mg/dL   Total Protein 7.3 6.5 - 8.1 g/dL   Albumin 2.7 (L) 3.5 - 5.0 g/dL   AST 12 (L) 15 - 41 U/L   ALT 11 0 - 44 U/L   Alkaline Phosphatase 67 38 - 126 U/L   Total Bilirubin 0.8 0.3 - 1.2 mg/dL   GFR, Estimated 31 (L) >60 mL/min    Comment: (NOTE) Calculated using the CKD-EPI Creatinine Equation (2021)    Anion gap 8 5 - 15    Comment: Performed at [facility], [address], [place], [place] [address]  Brain natriuretic peptide     Status: Abnormal   Collection Time: 07/07/23  6:18 PM  Result Value Ref Range   B Natriuretic Peptide 175.0 (H) 0.0 - 100.0 pg/mL    Comment: Performed at [facility], [address], [place], [place] [address]  Magnesium     Status: None   Collection Time: 07/07/23  6:18 PM  Result Value Ref Range   Magnesium 1.9 1.7 - 2.4 mg/dL    Comment: Performed at [facility], [address], [place], [place] [address]  Troponin I (High Sensitivity)     Status: None   Collection Time: 07/07/23  6:18 PM  Result Value Ref Range   Troponin I (High Sensitivity) 11 <18 ng/L    Comment: (NOTE) Elevated high sensitivity troponin I (hsTnI) values and significant  changes across serial measurements may suggest ACS but many other  chronic and acute conditions are known to elevate  hsTnI results.  Refer to the "Links" section for chest pain algorithms and additional  guidance. Performed at [facility], [address] [place], [place] [address]   Procalcitonin     Status: None   Collection Time: 07/07/23  6:18 PM  Result Value Ref Range   Procalcitonin 0.11 ng/mL    Comment:        Interpretation: PCT (Procalcitonin) <= 0.5 ng/mL: Systemic infection (sepsis) is not likely. Local bacterial infection is possible. (NOTE)       Sepsis PCT Algorithm           Lower Respiratory Tract                                      Infection PCT Algorithm    ----------------------------     ----------------------------         PCT < 0.25 ng/mL                PCT < 0.10 ng/mL          Strongly encourage             Strongly discourage   discontinuation of antibiotics    initiation of antibiotics    ----------------------------     -----------------------------       PCT 0.25 - 0.50 ng/mL            PCT 0.10 - 0.25 ng/mL               OR       >80% decrease in PCT            Discourage initiation of                                            antibiotics      Encourage discontinuation           of antibiotics    ----------------------------     -----------------------------         PCT >= 0.50 ng/mL              PCT 0.26 - 0.50 ng/mL               AND        <80% decrease in PCT             Encourage initiation of                                             antibiotics       Encourage continuation           of antibiotics    ----------------------------     -----------------------------        PCT >= 0.50 ng/mL                  PCT > 0.50 ng/mL               AND         increase in PCT                  Strongly encourage  initiation of antibiotics    Strongly encourage escalation           of antibiotics                                     -----------------------------                                           PCT <= 0.25 ng/mL                                                  OR                                        > 80% decrease in PCT                                      Discontinue / Do not initiate                                             antibiotics  Performed at [facility], [address], [place], [place] [address]   Urinalysis, Routine w reflex microscopic -Urine, Clean Catch     Status: Abnormal   Collection Time: 07/07/23  6:25 PM  Result Value Ref Range   Color, Urine YELLOW YELLOW   APPearance CLEAR CLEAR   Specific Gravity, Urine 1.009 1.005 - 1.030   pH 7.0 5.0 - 8.0   Glucose, UA NEGATIVE NEGATIVE mg/dL   Hgb urine dipstick MODERATE (A) NEGATIVE   Bilirubin Urine NEGATIVE NEGATIVE   Ketones, ur NEGATIVE NEGATIVE mg/dL   Protein, ur 604 (A) NEGATIVE mg/dL   Nitrite NEGATIVE NEGATIVE   Leukocytes,Ua TRACE (A) NEGATIVE   RBC / HPF 21-50 0 - 5 RBC/hpf   WBC, UA 11-20 0 - 5 WBC/hpf   Bacteria, UA NONE SEEN NONE SEEN   Squamous Epithelial / HPF 0-5 0 - 5 /HPF   Mucus PRESENT     Comment: Performed at [facility], [address], [place], [place] [address]  Blood gas, venous     Status: Abnormal   Collection Time: 07/08/23 12:15 AM  Result Value Ref Range   pH, Ven 7.46 (H) 7.25 - 7.43   pCO2, Ven 35 (L) 44 - 60 mmHg   pO2, Ven 53 (H) 32 - 45 mmHg   Bicarbonate 24.9 20.0 - 28.0 mmol/L   Acid-Base Excess 1.2 0.0 - 2.0 mmol/L   O2 Saturation 88.4 %   Patient temperature 36.9    Collection site BLOOD RIGHT HAND    Drawn by [id number]     Comment: Performed at [facility], [address], [place], [place] [address]  TSH     Status: Abnormal   Collection Time: 07/08/23  4:41 AM  Result Value Ref Range   TSH 9.279 (H) 0.350 - 4.500 uIU/mL  Comment: Performed by a 3rd Generation assay with a functional sensitivity of <=0.01 uIU/mL. Performed at [facility], [address] [place], [place] [address]   Comprehensive metabolic panel     Status: Abnormal   Collection Time: 07/08/23  4:41 AM   Result Value Ref Range   Sodium 135 135 - 145 mmol/L   Potassium 3.7 3.5 - 5.1 mmol/L   Chloride 107 98 - 111 mmol/L   CO2 22 22 - 32 mmol/L   Glucose, Bld 124 (H) 70 - 99 mg/dL    Comment: Glucose reference range applies only to samples taken after fasting for at least 8 hours.   BUN 26 (H) 8 - 23 mg/dL   Creatinine, Ser 4.69 (H) 0.61 - 1.24 mg/dL   Calcium 7.8 (L) 8.9 - 10.3 mg/dL   Total Protein 6.1 (L) 6.5 - 8.1 g/dL   Albumin 2.2 (L) 3.5 - 5.0 g/dL   AST 12 (L) 15 - 41 U/L   ALT 9 0 - 44 U/L   Alkaline Phosphatase 58 38 - 126 U/L   Total Bilirubin 0.8 0.3 - 1.2 mg/dL   GFR, Estimated 34 (L) >60 mL/min    Comment: (NOTE) Calculated using the CKD-EPI Creatinine Equation (2021)    Anion gap 6 5 - 15    Comment: Performed at [facility], [address], [place], [place] [address]  Magnesium     Status: None   Collection Time: 07/08/23  4:41 AM  Result Value Ref Range   Magnesium 1.9 1.7 - 2.4 mg/dL    Comment: Performed at [facility], [address], [place], [place] [address]  CBC with Differential/Platelet     Status: Abnormal   Collection Time: 07/08/23  4:41 AM  Result Value Ref Range   WBC 4.4 4.0 - 10.5 K/uL   RBC 3.02 (L) 4.22 - 5.81 MIL/uL   Hemoglobin 8.8 (L) 13.0 - 17.0 g/dL   HCT 44.0 (L) 10.2 - 72.5 %   MCV 95.7 80.0 - 100.0 fL   MCH 29.1 26.0 - 34.0 pg   MCHC 30.4 30.0 - 36.0 g/dL   RDW 36.6 (H) 44.0 - 34.7 %   Platelets 164 150 - 400 K/uL   nRBC 0.0 0.0 - 0.2 %   Neutrophils Relative % 93 %   Neutro Abs 4.1 1.7 - 7.7 K/uL   Lymphocytes Relative 5 %   Lymphs Abs 0.2 (L) 0.7 - 4.0 K/uL   Monocytes Relative 1 %   Monocytes Absolute 0.1 0.1 - 1.0 K/uL   Eosinophils Relative 0 %   Eosinophils Absolute 0.0 0.0 - 0.5 K/uL   Basophils Relative 0 %   Basophils Absolute 0.0 0.0 - 0.1 K/uL   Immature Granulocytes 1 %   Abs Immature Granulocytes 0.03 0.00 - 0.07 K/uL    Comment: Performed at [facility], [address], [place], [place] [address]  Vitamin  B12     Status: None   Collection Time: 07/08/23  4:41 AM  Result Value Ref Range   Vitamin B-12 479 180 - 914 pg/mL    Comment: (NOTE) This assay is not validated for testing neonatal or myeloproliferative syndrome specimens for Vitamin B12 levels. Performed at [facility], [address] [place], [place] [address]   Folate     Status: None   Collection Time: 07/08/23  4:41 AM  Result Value Ref Range   Folate 7.5 >5.9 ng/mL    Comment: Performed at [facility], [address],  [place], [place] [address]  Iron and TIBC     Status: Abnormal   Collection Time: 07/08/23  4:41 AM  Result Value Ref Range   Iron 21 (L) 45 - 182 ug/dL   TIBC 272 (L) 536 - 644 ug/dL   Saturation Ratios 13 (L) 17.9 - 39.5 %   UIBC 147 ug/dL    Comment: Performed at [facility], [address] [place], [place] [address]  Ferritin     Status: None   Collection Time: 07/08/23  4:41 AM  Result Value Ref Range   Ferritin 164 24 - 336 ng/mL    Comment: Performed at [facility], [address], [place], [place] [address]  Reticulocytes     Status: Abnormal   Collection Time: 07/08/23  4:41 AM  Result Value Ref Range   Retic Ct Pct 2.7 0.4 - 3.1 %   RBC. 2.91 (L) 4.22 - 5.81 MIL/uL   Retic Count, Absolute 77.1 19.0 - 186.0 K/uL   Immature Retic Fract 25.4 (H) 2.3 - 15.9 %    Comment: Performed at [facility], [address] [place], [place] [address]  Basic metabolic panel     Status: Abnormal   Collection Time: 07/09/23  4:25 AM  Result Value Ref Range   Sodium 137 135 - 145 mmol/L   Potassium 4.1 3.5 - 5.1 mmol/L   Chloride 107 98 - 111 mmol/L   CO2 24 22 - 32 mmol/L   Glucose, Bld 134 (H) 70 - 99 mg/dL    Comment: Glucose reference range applies only to samples taken after fasting for at least 8 hours.   BUN 32 (H) 8 - 23 mg/dL   Creatinine, Ser 4.33 (H) 0.61 - 1.24 mg/dL   Calcium 7.9 (L) 8.9 - 10.3 mg/dL   GFR, Estimated 30 (L) >60 mL/min    Comment: (NOTE) Calculated using the CKD-EPI  Creatinine Equation (2021)    Anion gap 6 5 - 15    Comment: Performed at [facility], [address], [place], [place] [address]   No results found for this or any previous visit (from the past 240 hour(s)). Creatinine: Recent Labs    07/07/23 1818 07/08/23 0441 07/09/23 0425  CREATININE 2.04* 1.91* 2.08*   Baseline Creatinine: unknown  Foley Catheter Placemenet--Complex  Patient was prepped and draped in the usual sterile fashion using betadine solution. 2% viscous lidocaine was inserted per urethra. A 0.038 sensor was advanced per urethra without significant resistance or recoiling. Upon passage through the bladder neck with the wire urine was noted to emanate from the urethral meatus. Uisng the rigid dilators we dilated to meatus and bulbar urethra from 6 french to 18 french. A 16 Fr council catheter was placed via the Blitz technique and passed easily with immediate return of >1000 cc of clear, amber colored urine without clot. 10 cc sterile water was placed in the balloon port.    Impression/Assessment:  89yo with BPH and urethral stricture  Plan:  Urinary retention: The patients retention is a combination of urethral stricture and BPH. We will increase flomax to 0.4mg  BID Urethral stricture: Stricture successfully dilated to 18 french and 16 french foley placed. Patient can have a voiding trial in 1 week  [name] 07/09/2023, 8:28 PM

## 2023-07-09 NOTE — Plan of Care (Signed)
  Problem: Activity: Goal: Risk for activity intolerance will decrease Outcome: Progressing   Problem: Coping: Goal: Level of anxiety will decrease Outcome: Progressing   

## 2023-07-09 NOTE — Progress Notes (Signed)
Progress Note   Patient: [name]:[id number] DOB: [birth year] DOA: 07/07/2023     2 DOS: the patient was seen and examined on 07/09/2023   Brief hospital admission course: As per H&P written by Dr. Carren Rang on 07/08/23 [name] is a 89 y.o. 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  Patient reports that he has had bilateral peripheral edema for 2 weeks.  It was reported that he had been way less active than normal for the last 2 weeks.  We have been sitting in his chair, as he was extremely short of breath with any exertion at all.  Patient reports that he has not been coughing, has not had a fever.  He denies any chest pain, palpitations, dizziness.  He reports he wears no oxygen at home.  He has been feeling more short of breath over the last 6 months but it has been worse over the last 1 week.  He reports his swelling in his legs has been present for a long time as well, but has been worse over the last 2 weeks.  He denies any missed medications or history of rebound hypertension.  He reports an increase in urine output.  He does feel better since having the oxygen on.  On review of systems he denies nausea, vomiting, fever, dysuria.  He reports he has had some diarrhea.  He denies hematochezia and melena.  Patient denies any obvious signs of bleeding.  He has no other complaints at this time.    Assessment and Plan: * Acute respiratory failure with hypoxia (HCC) - Likely multifactorial: Setting of COPD exacerbation, further progression of lung cancer and vascular congestion. -Continue treatment with steroids, bronchodilators, mucolytic's and oxygen supplementation. -Patient will also continue IV diuresis, low-sodium diet, daily weights, strict I's and O's and follow electrolytes/renal function. - Continue to monitor, wean off  oxygen supplementation as tolerated  Hypertensive crisis - Continue current antihypertensive agents and follow vital signs Heart healthy status oral diet discussed with patient.  Peripheral edema/lower extremity swelling -In the setting of pulmonary edema and vascular congestion -Follow-up echo -Continue IV diuresis -Continue holding Norvasc -Low-sodium diet discussed with patient -Follow daily weights, strict I's and O's and clinical response.  Thyromegaly - CT scan of the chest suggesting thyromegaly -Continue adjusted dose of Synthroid -Continue to follow thyroid panel -Continue outpatient follow-up with PCP.  Hypothyroidism - Continue Synthroid -TSH in the mild range -Dosage recently adjusted -Follow response.  Essential hypertension - Continue holding Norvasc - Continue labetalol and ongoing diuresis -Follow vital signs -Heart healthy/low-sodium diet discussed with patient.  Anemia of chronic disease - Hemoglobin dropped from 10.2>> 8.8 - No overt bleeding appreciated -Delusional effect suspected -Patient with underlying anemia from chronic kidney disease -Continue to follow hemoglobin trend.  Primary cancer of right upper lobe of lung (HCC) - CTA showing increased soft tissue thickening in the right hilum with mass effect on the right mainstem bronchus and lower lobe bronchi - Outpatient follow-up with oncology service recommended -Continue oxygen supplementation -Continue treatment with steroids.  Chronic renal failure/moderate hydronephrosis and urinary retention -Patient with a stage IIIb renal function at baseline -For the most part appeared to be stable -Bilateral moderate hydronephrosis and acute urinary retention appreciated -Urology service consulted -Will follow recommendations and plans for Foley catheter placement.  Subjective:  No chest pain, no nausea, no vomiting.  Complaining  of lower extremity stiffness and weakness; no fever.  Still  requiring around 2 L nasal cannula supplementation and feeling short winded with exertion (overall symptoms improving).  Physical Exam: Vitals:   07/08/23 2039 07/09/23 0436 07/09/23 1054 07/09/23 1339  BP:  (!) 151/86  (!) 171/93  Pulse:  82  80  Resp:  18  18  Temp:  97.8 F (36.6 C)  98.8 F (37.1 C)  TempSrc:  Oral    SpO2: 94% 98% 91% 98%  Weight:      Height:       General exam: Alert, awake, oriented x 3; no chest pain, no nausea, no vomiting.  Reporting feeling weak, stiff in his lower extremity and deconditioning; no fever and reports breathing improving. Respiratory system: Improved air movement bilaterally; decreased breath sounds at the bases appreciated.  Positive scattered rhonchi.  Mild expiratory wheezing. Cardiovascular system: Rate controlled, no rubs, no gallops, no JVD. Gastrointestinal system: Abdomen is nondistended, soft and nontender. No organomegaly or masses felt. Normal bowel sounds heard. Central nervous system: Generalized weakness/deconditioning. No focal neurological deficits. Extremities: No cyanosis or clubbing appreciated; bilateral stasis dermatitis seen and examined; patient with bilateral knees and lower extremity stiffness.  Reporting inability to fully extend his legs and expressing feeling weak. Skin: No petechiae.  Positive vitiligo changes. Psychiatry: Judgement and insight appear normal. Mood & affect appropriate.   Data Reviewed: Basic metabolic panel: Sodium 137, potassium 4.1, chloride 107, bicarb 24, BUN 32, creatinine 2.08 and GFR 30  Family Communication: Daughters and son-in-law at bedside.  Disposition: Status is: Inpatient Remains inpatient appropriate because: Continue IV diuresis, continue treatment with steroids and follow neurology service recommendation as part of further management for urinary retention and bilateral hydronephrosis.   Planned Discharge Destination: To be determined.  Time spent: 50  minutes  Author: Vassie Loll, MD 07/09/2023 2:38 PM  For on call review [email or web address].

## 2023-07-09 NOTE — Plan of Care (Signed)

## 2023-07-09 NOTE — Progress Notes (Signed)
  Echocardiogram 2D Echocardiogram has been performed.  [name] 07/09/2023, 8:35 AM

## 2023-07-09 NOTE — Progress Notes (Signed)
Pt gave permission to speak with eldest daughter mildred when she called his cell phone.Marland Kitchen

## 2023-07-09 NOTE — Care Management Important Message (Signed)
Important Message  Patient Details  Name: NOAL ABSHIER MRN: [id number] Date of Birth: 03-12-1933   Important Message Given:  N/A - LOS <3 / Initial given by admissions     Corey Harold 07/09/2023, 2:08 PM

## 2023-07-10 DIAGNOSIS — I5032 Chronic diastolic (congestive) heart failure: Secondary | ICD-10-CM

## 2023-07-10 DIAGNOSIS — I10 Essential (primary) hypertension: Secondary | ICD-10-CM | POA: Diagnosis not present

## 2023-07-10 DIAGNOSIS — J9601 Acute respiratory failure with hypoxia: Secondary | ICD-10-CM | POA: Diagnosis not present

## 2023-07-10 DIAGNOSIS — N1832 Chronic kidney disease, stage 3b: Secondary | ICD-10-CM | POA: Diagnosis not present

## 2023-07-10 DIAGNOSIS — I5033 Acute on chronic diastolic (congestive) heart failure: Secondary | ICD-10-CM | POA: Diagnosis not present

## 2023-07-10 DIAGNOSIS — E039 Hypothyroidism, unspecified: Secondary | ICD-10-CM

## 2023-07-10 LAB — BASIC METABOLIC PANEL
Anion gap: 5 (ref 5–15)
BUN: 36 mg/dL — ABNORMAL HIGH (ref 8–23)
CO2: 25 mmol/L (ref 22–32)
Calcium: 7.6 mg/dL — ABNORMAL LOW (ref 8.9–10.3)
Chloride: 110 mmol/L (ref 98–111)
Creatinine, Ser: 2.12 mg/dL — ABNORMAL HIGH (ref 0.61–1.24)
GFR, Estimated: 30 mL/min — ABNORMAL LOW (ref >60)
Glucose, Bld: 99 mg/dL (ref 70–99)
Potassium: 4 mmol/L (ref 3.5–5.1)
Sodium: 140 mmol/L (ref 135–145)

## 2023-07-10 MED ORDER — FUROSEMIDE 40 MG PO TABS
40.0000 mg | ORAL_TABLET | Freq: Every day | ORAL | Status: DC
  Administered 2023-07-10 – 2023-07-16 (×7): 40 mg via ORAL
  Filled 2023-07-10 (×7): qty 1

## 2023-07-10 MED ORDER — SODIUM CHLORIDE 0.9 % IV SOLN
300.0000 mg | Freq: Once | INTRAVENOUS | Status: AC
  Administered 2023-07-10: 300 mg via INTRAVENOUS
  Filled 2023-07-10: qty 300

## 2023-07-10 NOTE — Hospital Course (Addendum)
Mr. Clear was admitted to the hospital with the working diagnosis of COPD exacerbation.   89 y.o. 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  Patient reports that he has had bilateral peripheral edema for 2 weeks. Feeling more short of breath over the last 6 months but it has been worse over the last 1 week.   Chest radiograph with mild cardiomegaly, positive hyperinflation with increased lung markings, no effusions. Positive right hilar density, possible lymph node and right upper lobe peripheral density.   CT chest with severe emphysematous changes centrilobular and pre septal. Traction bronchiectasis.  Increased soft tissue thickening about the right hilum. (Will need follow up with contrast CT) Question enlargement of the right thyroid lobe vs paraesophageal lymphadenopathy.  Question bilateral hydronephrosis.   10/13 improvement in volume status, pending PT evaluation, he may need SNF.

## 2023-07-10 NOTE — Assessment & Plan Note (Signed)
Echocardiogram with preserved LV systolic function >75%, moderate LVH, RV systolic function is low normal, RVSP 56,0 mmHg, RA mild to moderate dilatation, small pericardial effusion, circumferential, mild to moderate TR,    Peripheral edema has improved with furosemide.  Plan to transition to oral furosemide 20 mg po daily.  Low GFR limiting further medial therapy.

## 2023-07-10 NOTE — Assessment & Plan Note (Signed)
Renal function with serum cr at 2,1 with K at 4,0 and serum bicarbonate at 25. Na is 140.   Plan to continue oral furosemide 40 mg po daily. Follow up renal function in am, avoid hypotension and nephrotoxic medications.

## 2023-07-10 NOTE — Plan of Care (Signed)

## 2023-07-10 NOTE — Progress Notes (Signed)
Progress Note   Patient: [name]:[id number] DOB: 19-Jul-1933 DOA: 07/07/2023     3 DOS: the patient was seen and examined on 07/10/2023   Brief hospital course: [name] was admitted to the hospital with the working diagnosis of COPD exacerbation.   89 y.o. 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  Patient reports that he has had bilateral peripheral edema for 2 weeks. Feeling more short of breath over the last 6 months but it has been worse over the last 1 week.   Chest radiograph with mild cardiomegaly, positive hyperinflation with increased lung markings, no effusions. Positive right hilar density, possible lymph node and right upper lobe peripheral density.   CT chest with severe emphysematous changes centrilobular and pre septal. Traction bronchiectasis.  Increased soft tissue thickening about the right hilum. (Will need follow up with contrast CT) Question enlargement of the right thyroid lobe vs paraesophageal lymphadenopathy.  Question bilateral hydronephrosis.    Assessment and Plan: * Acute respiratory failure with hypoxia (HCC) COPD exacerbation. Severe underlying emphysematous changes with significant dead space.  02 saturation today is 96% on 1,5 L/min per [place].   Plan to continue bronchodilator therapy, and inhaled corticosteroids.  Continue airway clearing techniques with flutter valve and incentive spirometer.  Hold on systemic steroids for now.   Acute on chronic diastolic CHF (congestive heart failure) (HCC) Echocardiogram with preserved LV systolic function >75%, moderate LVH, RV systolic function is low normal, RVSP 56,0 mmHg, RA mild to moderate dilatation, small pericardial effusion, circumferential, mild to moderate TR,    Peripheral edema has improved with furosemide.  Plan to transition to oral  furosemide 20 mg po daily.  Low GFR limiting further medial therapy.   Chronic kidney disease, stage 3b (HCC) Renal function with serum cr at 2,1 with K at 4,0 and serum bicarbonate at 25. Na is 140.   Plan to continue oral furosemide 40 mg po daily. Follow up renal function in am, avoid hypotension and nephrotoxic medications.   Essential hypertension Continue blood pressure control with labetalol.   Primary cancer of right upper lobe of lung (HCC) - CTA showing increased soft tissue thickening in the right hilum with mass effect on the right mainstem bronchus and lower lobe bronchi - Patient is no longer on immunotherapy - Likely contributing to his hypoxia - Continue oxygen supplementation as needed  Hypothyroidism Continue levothyroxine.  Possible thyromegaly.   Anemia of chronic disease Iron deficiency with serum iron at 21, with TIBC 168, transferrin saturation 13 and ferritin 164,   Plan to add IV iron.         Subjective: Patient with improvement in dyspnea, continue very weak and deconditioned, per his daughter he has not been out of bed yet.   Physical Exam: Vitals:   07/10/23 0719 07/10/23 0843 07/10/23 1221 07/10/23 1413  BP:  (!) 154/84 (!) 156/78   Pulse: 74 74 72 72  Resp: 15  16 16   Temp:   97.7 F (36.5 C)   TempSrc:   Oral   SpO2: 98%  96% 96%  Weight:      Height:       Neurology awake and alert ENT with mild pallor Cardiovascular with S1 and S2 present and regular, positive systolic murmur at the right lower sternal border Respiratory with decreased breath sounds, prolonged expiratory phase with no wheezing or rhonchi  Abdomen with no distention  Trace lower extremity edema  Data Reviewed:    Family Communication: I spoke with patient's daughter at the bedside, we talked in detail about patient's condition, plan of care and prognosis and all questions were addressed.   Disposition: Status is: Inpatient Remains inpatient appropriate  because: pending PT and OT evaluation   Planned Discharge Destination:  to be determined      Author: Coralie Keens, MD 07/10/2023 2:31 PM  For on call review [email or web address].

## 2023-07-10 NOTE — Assessment & Plan Note (Addendum)
COPD exacerbation. Severe underlying emphysematous changes with significant dead space.  02 saturation today is 96% on 2 L/min per [place].   Plan to continue bronchodilator therapy, and inhaled corticosteroids.  Continue airway clearing techniques with flutter valve and incentive spirometer.

## 2023-07-11 DIAGNOSIS — I5033 Acute on chronic diastolic (congestive) heart failure: Secondary | ICD-10-CM | POA: Diagnosis not present

## 2023-07-11 DIAGNOSIS — J9601 Acute respiratory failure with hypoxia: Secondary | ICD-10-CM | POA: Diagnosis not present

## 2023-07-11 DIAGNOSIS — N1832 Chronic kidney disease, stage 3b: Secondary | ICD-10-CM | POA: Diagnosis not present

## 2023-07-11 DIAGNOSIS — I10 Essential (primary) hypertension: Secondary | ICD-10-CM | POA: Diagnosis not present

## 2023-07-11 LAB — BASIC METABOLIC PANEL
Anion gap: 7 (ref 5–15)
BUN: 37 mg/dL — ABNORMAL HIGH (ref 8–23)
CO2: 23 mmol/L (ref 22–32)
Calcium: 7.5 mg/dL — ABNORMAL LOW (ref 8.9–10.3)
Chloride: 108 mmol/L (ref 98–111)
Creatinine, Ser: 2.11 mg/dL — ABNORMAL HIGH (ref 0.61–1.24)
GFR, Estimated: 30 mL/min — ABNORMAL LOW (ref >60)
Glucose, Bld: 121 mg/dL — ABNORMAL HIGH (ref 70–99)
Potassium: 3.9 mmol/L (ref 3.5–5.1)
Sodium: 138 mmol/L (ref 135–145)

## 2023-07-11 LAB — MAGNESIUM: Magnesium: 1.9 mg/dL (ref 1.7–2.4)

## 2023-07-11 MED ORDER — IPRATROPIUM-ALBUTEROL 0.5-2.5 (3) MG/3ML IN SOLN: 3.0000 mL | Freq: Four times a day (QID) | RESPIRATORY_TRACT | Status: DC | PRN

## 2023-07-11 NOTE — Progress Notes (Signed)
Progress Note   Patient: [name] RJJ:[id number] DOB: Jan 26, 1933 DOA: 07/07/2023     4 DOS: the patient was seen and examined on 07/11/2023   Brief hospital course: Mr. Serven was admitted to the hospital with the working diagnosis of COPD exacerbation.   89 y.o. 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  Patient reports that he has had bilateral peripheral edema for 2 weeks. Feeling more short of breath over the last 6 months but it has been worse over the last 1 week.   Chest radiograph with mild cardiomegaly, positive hyperinflation with increased lung markings, no effusions. Positive right hilar density, possible lymph node and right upper lobe peripheral density.   CT chest with severe emphysematous changes centrilobular and pre septal. Traction bronchiectasis.  Increased soft tissue thickening about the right hilum. (Will need follow up with contrast CT) Question enlargement of the right thyroid lobe vs paraesophageal lymphadenopathy.  Question bilateral hydronephrosis.   10/13 improvement in volume status, pending PT evaluation, he may need SNF.   Assessment and Plan: * Acute respiratory failure with hypoxia (HCC) COPD exacerbation. Severe underlying emphysematous changes with significant dead space.  02 saturation today is 96% on 2 L/min per [place].   Plan to continue bronchodilator therapy, and inhaled corticosteroids.  Continue airway clearing techniques with flutter valve and incentive spirometer.   Acute on chronic diastolic CHF (congestive heart failure) (HCC) Echocardiogram with preserved LV systolic function >75%, moderate LVH, RV systolic function is low normal, RVSP 56,0 mmHg, RA mild to moderate dilatation, small pericardial effusion, circumferential, mild to moderate TR,    Continue with oral furosemide 40 mg po  daily.  Low GFR limiting further medial therapy.   Chronic kidney disease, stage 3b (HCC) Improved volume status, renal function with serum cr at 2,1 with K at 3,9 and serum bicarbonate at 23.   Plan to continue oral furosemide 40 mg po daily. Follow up renal function in am, avoid hypotension and nephrotoxic medications.   Essential hypertension Continue blood pressure control with labetalol.   Primary cancer of right upper lobe of lung (HCC) - CTA showing increased soft tissue thickening in the right hilum with mass effect on the right mainstem bronchus and lower lobe bronchi - Patient is no longer on immunotherapy - Likely contributing to his hypoxia - Continue oxygen supplementation as needed  Hypothyroidism Continue levothyroxine.  Possible thyromegaly.   Anemia of chronic disease Iron deficiency with serum iron at 21, with TIBC 168, transferrin saturation 13 and ferritin 164,   Sp IV iron 300 mg iron sucrose.          Subjective: Patient with improvement in dyspnea, no chest pain or edema, continue very weak and deconditioned, not back to his baseline   Physical Exam: Vitals:   07/11/23 0630 07/11/23 0745 07/11/23 0746 07/11/23 1104  BP: (!) 157/91   (!) 157/80  Pulse: 74   (!) 39  Resp: 16   18  Temp: 98 F (36.7 C)   98 F (36.7 C)  TempSrc: Oral   Oral  SpO2: 98% 98% 98% 98%  Weight:      Height:       Neurology awake and alert ENT with mild pallor Cardiovascular with S1 and S2 present and regular with no gallops, rubs or murmurs Respiratory with no rales or wheezing, on anterior auscultation  Abdomen with no  distention  No lower extremity edema  Data Reviewed:    Family Communication: no family at the bedside   Disposition: Status is: Inpatient Remains inpatient appropriate because: pending PT evaluation   Planned Discharge Destination: Skilled nursing facility     Author: Coralie Keens, MD 07/11/2023 1:18 PM  For on call  review [email or web address].

## 2023-07-11 NOTE — Progress Notes (Signed)
   07/11/23 1500  Assess: MEWS Score  Pulse Rate 73  Assess: MEWS Score  MEWS Temp 0  MEWS Systolic 0  MEWS Pulse 0  MEWS RR 0  MEWS LOC 0  MEWS Score 0  MEWS Score Color Green  Assess: if the MEWS score is Yellow or Red  Were vital signs accurate and taken at a resting state? No, vital signs rechecked  Assess: SIRS CRITERIA  SIRS Temperature  0  SIRS Pulse 0  SIRS Respirations  0  SIRS WBC 0  SIRS Score Sum  0

## 2023-07-12 ENCOUNTER — Ambulatory Visit: Payer: Medicare HMO | Admitting: Urology

## 2023-07-12 DIAGNOSIS — N179 Acute kidney failure, unspecified: Secondary | ICD-10-CM | POA: Diagnosis not present

## 2023-07-12 DIAGNOSIS — E010 Iodine-deficiency related diffuse (endemic) goiter: Secondary | ICD-10-CM | POA: Diagnosis not present

## 2023-07-12 DIAGNOSIS — I169 Hypertensive crisis, unspecified: Secondary | ICD-10-CM | POA: Diagnosis not present

## 2023-07-12 DIAGNOSIS — J9601 Acute respiratory failure with hypoxia: Secondary | ICD-10-CM | POA: Diagnosis not present

## 2023-07-12 LAB — BASIC METABOLIC PANEL
Anion gap: 5 (ref 5–15)
BUN: 36 mg/dL — ABNORMAL HIGH (ref 8–23)
CO2: 26 mmol/L (ref 22–32)
Calcium: 7.5 mg/dL — ABNORMAL LOW (ref 8.9–10.3)
Chloride: 108 mmol/L (ref 98–111)
Creatinine, Ser: 2.17 mg/dL — ABNORMAL HIGH (ref 0.61–1.24)
GFR, Estimated: 29 mL/min — ABNORMAL LOW (ref >60)
Glucose, Bld: 91 mg/dL (ref 70–99)
Potassium: 4.4 mmol/L (ref 3.5–5.1)
Sodium: 139 mmol/L (ref 135–145)

## 2023-07-12 MED ORDER — PREDNISONE 20 MG PO TABS
40.0000 mg | ORAL_TABLET | Freq: Every day | ORAL | Status: DC
  Administered 2023-07-13 – 2023-07-16 (×4): 40 mg via ORAL
  Filled 2023-07-12 (×4): qty 2

## 2023-07-12 NOTE — Evaluation (Signed)
Physical Therapy Evaluation Patient Details Name: [name] MRN: [id number] DOB: [birth year] Today's Date: 07/12/2023  History of Present Illness  [name] is a 89 y.o. 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  Patient reports that he has had bilateral peripheral edema for 2 weeks.  It was reported that he had been way less active than normal for the last 2 weeks.  We have been sitting in his chair, as he was extremely short of breath with any exertion at all.  Patient reports that he has not been coughing, has not had a fever.  He denies any chest pain, palpitations, dizziness.  He reports he wears no oxygen at home.  He has been feeling more short of breath over the last 6 months but it has been worse over the last 1 week.  He reports his swelling in his legs has been present for a long time as well, but has been worse over the last 2 weeks.  He denies any missed medications or history of rebound hypertension.  He reports an increase in urine output.  He does feel better since having the oxygen on.  On review of systems he denies nausea, vomiting, fever, dysuria.  He reports he has had some diarrhea.  He denies hematochezia and melena.  Patient denies any obvious signs of bleeding.  He has no other complaints at this time.   Clinical Impression  Patient demonstrates slow labored movement for sitting up at bedside, has difficulty completing sit to stands due to BLE weakness and limited to a few side steps at bedside before having to sit due to fall risk, fatigue and SpO2 dropping to 84% while on room air.  Patient put back on 3 LPM with SpO2 increasing to 93% and tolerated sitting up in chair after therapy - nursing staff notified.  Patient will benefit from continued skilled physical therapy in hospital and recommended venue below to increase  strength, balance, endurance for safe ADLs and gait.       If plan is discharge home, recommend the following: A lot of help with bathing/dressing/bathroom;A lot of help with walking and/or transfers;Help with stairs or ramp for entrance;Assistance with cooking/housework   Can travel by private vehicle   No    Equipment Recommendations None recommended by PT  Recommendations for Other Services       Functional Status Assessment       Precautions / Restrictions Precautions Precautions: Fall Restrictions Weight Bearing Restrictions: No      Mobility  Bed Mobility Overal bed mobility: Needs Assistance Bed Mobility: Supine to Sit     Supine to sit: Contact guard, Min assist     General bed mobility comments: increased time, labored movement    Transfers Overall transfer level: Needs assistance Equipment used: Rolling walker (2 wheels) Transfers: Sit to/from Stand, Bed to chair/wheelchair/BSC Sit to Stand: Min assist, Mod assist   Step pivot transfers: Mod assist       General transfer comment: unsteady labored movement    Ambulation/Gait Ambulation/Gait assistance: Mod assist Gait Distance (Feet): 5 Feet Assistive device: Rolling walker (2 wheels) Gait Pattern/deviations: Decreased step length - right, Decreased step length - left, Decreased stride length, Trunk flexed Gait velocity: slow     General Gait Details: limited to a few slow labored side steps due to fatigue and generalized weakness  Stairs  Wheelchair Mobility     Tilt Bed    Modified Rankin (Stroke Patients Only)       Balance Overall balance assessment: Needs assistance Sitting-balance support: Feet supported, No upper extremity supported Sitting balance-Leahy Scale: Fair Sitting balance - Comments: fair/good seated at EOB   Standing balance support: Reliant on assistive device for balance, During functional activity, Bilateral upper extremity supported Standing  balance-Leahy Scale: Poor Standing balance comment: fair/poor using RW                             Pertinent Vitals/Pain Pain Assessment Pain Assessment: Faces Faces Pain Scale: Hurts little more Pain Location: bilateral feet Pain Descriptors / Indicators: Burning Pain Intervention(s): Limited activity within patient's tolerance, Monitored during session, Repositioned    Home Living Family/patient expects to be discharged to:: Private residence Living Arrangements: Alone Available Help at Discharge: Family;Available PRN/intermittently Type of Home: House Home Access: Level entry       Home Layout: One level Home Equipment: Agricultural consultant (2 wheels);Cane - quad;Grab bars - tub/shower      Prior Function Prior Level of Function : Independent/Modified Independent             Mobility Comments: household and short distanced community ambulator using quad-cane, does not drive ADLs Comments: Independent with household ADLs, assisted by family for community     Extremity/Trunk Assessment   Upper Extremity Assessment Upper Extremity Assessment: Generalized weakness    Lower Extremity Assessment Lower Extremity Assessment: Generalized weakness    Cervical / Trunk Assessment Cervical / Trunk Assessment: Kyphotic  Communication   Communication Communication: No apparent difficulties  Cognition Arousal: Alert Behavior During Therapy: WFL for tasks assessed/performed Overall Cognitive Status: Within Functional Limits for tasks assessed                                          General Comments      Exercises     Assessment/Plan    PT Assessment Patient needs continued PT services  PT Problem List Decreased strength;Decreased activity tolerance;Decreased balance;Decreased mobility       PT Treatment Interventions DME instruction;Gait training;Stair training;Functional mobility training;Therapeutic activities;Therapeutic  exercise;Balance training;Patient/family education    PT Goals (Current goals can be found in the Care Plan section)  Acute Rehab PT Goals Patient Stated Goal: return home after rehab PT Goal Formulation: With patient Time For Goal Achievement: 07/26/23 Potential to Achieve Goals: Good    Frequency Min 3X/week     Co-evaluation               AM-PAC PT "6 Clicks" Mobility  Outcome Measure Help needed turning from your back to your side while in a flat bed without using bedrails?: A Little Help needed moving from lying on your back to sitting on the side of a flat bed without using bedrails?: A Little Help needed moving to and from a bed to a chair (including a wheelchair)?: A Lot Help needed standing up from a chair using your arms (e.g., wheelchair or bedside chair)?: A Lot Help needed to walk in hospital room?: A Lot Help needed climbing 3-5 steps with a railing? : A Lot 6 Click Score: 14    End of Session Equipment Utilized During Treatment: Oxygen Activity Tolerance: Patient tolerated treatment well;Patient limited by fatigue Patient left: in chair;with call bell/phone  within reach Nurse Communication: Mobility status PT Visit Diagnosis: Unsteadiness on feet (R26.81);Other abnormalities of gait and mobility (R26.89);Muscle weakness (generalized) (M62.81)    Time: 1610-9604 PT Time Calculation (min) (ACUTE ONLY): 21 min   Charges:   PT Evaluation $PT Eval Moderate Complexity: 1 Mod PT Treatments $Therapeutic Activity: 8-22 mins PT General Charges $$ ACUTE PT VISIT: 1 Visit         3:47 PM, 07/12/23 Ocie Bob, MPT Physical Therapist with [facility] [phone] office [phone] mobile phone

## 2023-07-12 NOTE — Progress Notes (Signed)
Progress Note   Patient: [name] VWU:[id number] DOB: 11-17-1932 DOA: 07/07/2023     5 DOS: the patient was seen and examined on 07/12/2023   Brief hospital admission course: Mr. Figg was admitted to the hospital with the working diagnosis of COPD exacerbation.    89 y.o. 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  Patient reports that he has had bilateral peripheral edema for 2 weeks. Feeling more short of breath over the last 6 months but it has been worse over the last 1 week.    Chest radiograph with mild cardiomegaly, positive hyperinflation with increased lung markings, no effusions. Positive right hilar density, possible lymph node and right upper lobe peripheral density.    CT chest with severe emphysematous changes centrilobular and pre septal. Traction bronchiectasis.  Increased soft tissue thickening about the right hilum. (Will need follow up with contrast CT) Question enlargement of the right thyroid lobe vs paraesophageal lymphadenopathy.  Question bilateral hydronephrosis.   Assessment and Plan: * Acute respiratory failure with hypoxia (HCC) COPD exacerbation. Severe underlying emphysematous changes with significant dead space.  02 saturation today is 96% on 2 L/min per [place].  -Continue oral steroids, bronchodilator management and wean off oxygen supplementation. -Desaturation screening assessment in AM. -Continue airway clearing techniques with flutter valve and incentive spirometer.   Acute on chronic diastolic CHF (congestive heart failure) (HCC) Echocardiogram with preserved LV systolic function >75%, moderate LVH, RV systolic function is low normal, RVSP 56,0 mmHg, RA mild to moderate dilatation, small pericardial effusion, circumferential, mild to moderate TR,    -Continue with oral furosemide 40 mg po daily.   -Low GFR limiting further medial therapy.  -Continue daily weights, low-sodium diet and adequate hydration.  Chronic kidney disease, stage 3b (HCC) Improved volume status, renal function with serum cr at 2,1 with K at 3,9 and serum bicarbonate at 23.   -Plan to continue oral furosemide 40 mg po daily. -Continue to follow up renal function in am, avoid hypotension and nephrotoxic medications.   Essential hypertension -Continue blood pressure control with labetalol.   Primary cancer of right upper lobe of lung (HCC) - CTA showing increased soft tissue thickening in the right hilum with mass effect on the right mainstem bronchus and lower lobe bronchi - Patient is no longer on immunotherapy - Likely contributing to his hypoxia - Continue oxygen supplementation as needed -Assess the saturation screening -Continue outpatient follow-up with oncology service.  Hypothyroidism Continue levothyroxine.  Possible thyromegaly.   Anemia of chronic disease Iron deficiency with serum iron at 21, with TIBC 168, transferrin saturation 13 and ferritin 164,   -Sp IV iron 300 mg iron sucrose.   -Continue to follow hemoglobin trend.   Subjective:  No chest pain, no nausea, no vomiting.  Reports improvement in his breathing.  Feeling weak and tired.  2-3D nasal cannula supplementation in place.  Physical Exam: Vitals:   07/11/23 2140 07/12/23 0420 07/12/23 0719 07/12/23 1157  BP:  (!) 173/88  (!) 199/96  Pulse: 68 73  68  Resp:  16  18  Temp:  97.9 F (36.6 C)  98 F (36.7 C)  TempSrc:  Oral  Oral  SpO2:  98% 94% 100%  Weight:      Height:       General exam: Alert, awake, following commands appropriately; no chest pain, no nausea, no vomiting.  Feeling  weak and tired.  Reports improvement in his breathing. Respiratory system: Very little expiratory wheezing on exam; no using accessory muscles.  2-treated nasal cannula supplementation in place with good saturation. Cardiovascular  system:RRR. No rubs or gallops; no JVD. Gastrointestinal system: Abdomen is nondistended, soft and nontender. No organomegaly or masses felt. Normal bowel sounds heard. Central nervous system: No focal neurological deficits. Extremities: No cyanosis or clubbing; trace edema appreciated bilaterally. Skin: No petechiae. Psychiatry: Flat affect.   Family Communication: no family at the bedside; daughter updated over the phone.  Disposition: Status is: Inpatient  Remains inpatient appropriate because: pending PT evaluation   Planned Discharge Destination: Skilled nursing facility   Author: Vassie Loll, MD 07/12/2023 6:04 PM  For on call review [email or web address].

## 2023-07-12 NOTE — [place] FL2 (Signed)
[place] MEDICAID FL2 LEVEL OF CARE FORM     IDENTIFICATION  Patient Name: [name] Birthdate: 24-Aug-1933 Sex: male Admission Date (Current Location): 07/07/2023  [place] and [place] Number:  Aaron Edelman [id number] K Facility and Address:  [facility],  [address] [place] [address]      Provider Number: [id number]  Attending Physician Name and Address:  Vassie Loll, MD  Relative Name and Phone Number:       Current Level of Care: Hospital Recommended Level of Care: Skilled Nursing Facility Prior Approval Number:    Date Approved/Denied:   PASRR Number: [id number]  Discharge Plan: SNF    Current Diagnoses: Patient Active Problem List   Diagnosis Date Noted   Acute on chronic diastolic CHF (congestive heart failure) (HCC) 07/10/2023   Chronic kidney disease, stage 3b (HCC) 07/10/2023   Thyromegaly 07/08/2023   Acute respiratory failure with hypoxia (HCC) 07/08/2023   Peripheral edema 07/08/2023   Hypertensive crisis 07/08/2023   COPD exacerbation (HCC) 07/07/2023   Penile discharge 05/06/2023   Vitiligo 12/17/2021   Chronic kidney disease, stage 4 (severe) (HCC) 07/18/2021   Essential hypertension 04/28/2021   Hypothyroidism 04/28/2021   Underweight 04/28/2021   Chronic pain syndrome 04/28/2021   Carcinoma of prostate (HCC) 04/28/2021   Benign prostatic hyperplasia 04/28/2021   Osteoarthritis of knees, bilateral 04/28/2021   Dysphagia 04/10/2021   Anemia of chronic disease 02/10/2021   Cancer associated pain 12/23/2020   Primary cancer of right upper lobe of lung (HCC) 11/07/2020    Orientation RESPIRATION BLADDER Height & Weight     Self, Time, Situation, Place  O2 (3L) Incontinent, Indwelling catheter Weight: 164 lb 0.4 oz (74.4 kg) Height:  6\' 2"  (188 cm)  BEHAVIORAL SYMPTOMS/MOOD NEUROLOGICAL BOWEL NUTRITION STATUS      Incontinent Diet (Heart healthy)  AMBULATORY STATUS COMMUNICATION OF NEEDS Skin   Extensive Assist  Verbally Normal                       Personal Care Assistance Level of Assistance  Bathing, Dressing, Feeding Bathing Assistance: Limited assistance Feeding assistance: Independent Dressing Assistance: Limited assistance     Functional Limitations Info  Sight, Hearing, Speech Sight Info: Impaired Hearing Info: Impaired Speech Info: Adequate    SPECIAL CARE FACTORS FREQUENCY  PT (By licensed PT), OT (By licensed OT)     PT Frequency: 5 times weekly OT Frequency: 5 times weekly            Contractures Contractures Info: Not present    Additional Factors Info  Code Status, Allergies Code Status Info: FULL Allergies Info: NKA           Current Medications (07/12/2023):  This is the current hospital active medication list Current Facility-Administered Medications  Medication Dose Route Frequency Provider Last Rate Last Admin   acetaminophen (TYLENOL) tablet 650 mg  650 mg Oral Q6H PRN Zierle-Ghosh, Asia B, DO       Or   acetaminophen (TYLENOL) suppository 650 mg  650 mg Rectal Q6H PRN Zierle-Ghosh, Asia B, DO       albuterol (PROVENTIL) (2.5 MG/3ML) 0.083% nebulizer solution 2.5 mg  2.5 mg Nebulization Q2H PRN Zierle-Ghosh, Asia B, DO       Chlorhexidine Gluconate Cloth 2 % PADS 6 each  6 each Topical Daily Vassie Loll, MD   6 each at 07/12/23 0909   fluticasone furoate-vilanterol (BREO ELLIPTA) 100-25 MCG/ACT 1 puff  1 puff Inhalation Daily Zierle-Ghosh, Asia B,  DO   1 puff at 07/12/23 0719   furosemide (LASIX) tablet 40 mg  40 mg Oral Daily Arrien, York Ram, MD   40 mg at 07/12/23 1610   heparin injection 5,000 Units  5,000 Units Subcutaneous Q8H Zierle-Ghosh, Asia B, DO   5,000 Units at 07/12/23 1405   ipratropium-albuterol (DUONEB) 0.5-2.5 (3) MG/3ML nebulizer solution 3 mL  3 mL Nebulization Q6H PRN Arrien, York Ram, MD       labetalol (NORMODYNE) tablet 200 mg  200 mg Oral BID Zierle-Ghosh, Asia B, DO   200 mg at 07/12/23 0909   levothyroxine  (SYNTHROID) tablet 150 mcg  150 mcg Oral Daily Zierle-Ghosh, Asia B, DO   150 mcg at 07/12/23 0545   liver oil-zinc oxide (DESITIN) 40 % ointment   Topical Daily PRN Vassie Loll, MD       nitroGLYCERIN (NITROSTAT) SL tablet 0.4 mg  0.4 mg Sublingual Q5 min PRN Zierle-Ghosh, Asia B, DO       ondansetron (ZOFRAN) tablet 4 mg  4 mg Oral Q6H PRN Zierle-Ghosh, Asia B, DO       Or   ondansetron (ZOFRAN) injection 4 mg  4 mg Intravenous Q6H PRN Zierle-Ghosh, Asia B, DO       oxyCODONE (Oxy IR/ROXICODONE) immediate release tablet 5 mg  5 mg Oral Q4H PRN Zierle-Ghosh, Asia B, DO       pantoprazole (PROTONIX) EC tablet 40 mg  40 mg Oral Daily Vassie Loll, MD   40 mg at 07/12/23 9604     Discharge Medications: Please see discharge summary for a list of discharge medications.  Relevant Imaging Results:  Relevant Lab Results:   Additional Information SSN: [id number], [place]

## 2023-07-12 NOTE — TOC Initial Note (Signed)
Transition of Care [facility]) - Initial/Assessment Note    Patient Details  Name: [name] MRN: [id number] Date of Birth: 28-Apr-1933  [facility]) CM/SW Contact:    Villa Herb, LCSWA Phone Number: 07/12/2023, 4:01 PM  Clinical Narrative:                 PT is recommending SNF for pt at this time. CSW spoke with pt about referral, pt is interested but asked that CSW spoke with his daughter Ms. Merilynn Finland. CSW attempted to reach pts daughter, VM box is full, secure message sent to pts daughter requesting call back. CSW to await call back before sending SNF referral to local facilities. TOC to follow.   Expected Discharge Plan: Skilled Nursing Facility Barriers to Discharge: Continued Medical Work up   Patient Goals and CMS Choice Patient states their goals for this hospitalization and ongoing recovery are:: get better CMS Medicare.gov Compare Post Acute Care list provided to:: Patient Choice offered to / list presented to : Patient, Adult Children [place] ownership interest in [facility].provided to:: Patient    Expected Discharge Plan and Services In-house Referral: Clinical Social Work Discharge Planning Services: CM Consult Post Acute Care Choice: Skilled Nursing Facility Living arrangements for the past 2 months: Single Family Home                                      Prior Living Arrangements/Services Living arrangements for the past 2 months: Single Family Home Lives with:: Self Patient language and need for interpreter reviewed:: Yes Do you feel safe going back to the place where you live?: Yes      Need for Family Participation in Patient Care: Yes (Comment) Care giver support system in place?: Yes (comment)   Criminal Activity/Legal Involvement Pertinent to Current Situation/Hospitalization: No - Comment as needed  Activities of Daily Living   ADL Screening (condition at time of admission) Independently performs ADLs?:  No Does the patient have a NEW difficulty with bathing/dressing/toileting/self-feeding that is expected to last >3 days?: No Does the patient have a NEW difficulty with getting in/out of bed, walking, or climbing stairs that is expected to last >3 days?: No Does the patient have a NEW difficulty with communication that is expected to last >3 days?: No Is the patient deaf or have difficulty hearing?: No Does the patient have difficulty seeing, even when wearing glasses/contacts?: No Does the patient have difficulty concentrating, remembering, or making decisions?: Yes  Permission Sought/Granted                  Emotional Assessment Appearance:: Appears stated age Attitude/Demeanor/Rapport: Engaged Affect (typically observed): Accepting Orientation: : Oriented to Self, Oriented to Place, Oriented to  Time, Oriented to Situation Alcohol / Substance Use: Not Applicable Psych Involvement: No (comment)  Admission diagnosis:  COPD exacerbation (HCC) [J44.1] Acute hypoxic respiratory failure (HCC) [J96.01] Patient Active Problem List   Diagnosis Date Noted   Acute on chronic diastolic CHF (congestive heart failure) (HCC) 07/10/2023   Chronic kidney disease, stage 3b (HCC) 07/10/2023   Thyromegaly 07/08/2023   Acute respiratory failure with hypoxia (HCC) 07/08/2023   Peripheral edema 07/08/2023   Hypertensive crisis 07/08/2023   COPD exacerbation (HCC) 07/07/2023   Penile discharge 05/06/2023   Vitiligo 12/17/2021   Chronic kidney disease, stage 4 (severe) (HCC) 07/18/2021   Essential hypertension 04/28/2021   Hypothyroidism 04/28/2021  Underweight 04/28/2021   Chronic pain syndrome 04/28/2021   Carcinoma of prostate (HCC) 04/28/2021   Benign prostatic hyperplasia 04/28/2021   Osteoarthritis of knees, bilateral 04/28/2021   Dysphagia 04/10/2021   Anemia of chronic disease 02/10/2021   Cancer associated pain 12/23/2020   Primary cancer of right upper lobe of lung (HCC)  11/07/2020   PCP:  [name], MD Pharmacy:   [facility] - [place], [place] - [address] [place] [address] Phone: [phone] Fax: [phone]     Social Determinants of Health (SDOH) Social History: SDOH Screenings   Food Insecurity: No Food Insecurity (07/08/2023)  Housing: Low Risk  (07/08/2023)  Transportation Needs: No Transportation Needs (07/08/2023)  Utilities: Patient Declined (07/09/2023)  Depression (PHQ2-9): Low Risk  (02/23/2023)  Tobacco Use: Medium Risk (07/07/2023)   SDOH Interventions:     Readmission Risk Interventions     No data to display

## 2023-07-12 NOTE — Progress Notes (Signed)
Patients sats 87% when checked on 2L. Liter flow increased to 3L with improvement in sats to 94%.

## 2023-07-12 NOTE — Plan of Care (Signed)
  Problem: Acute Rehab PT Goals(only PT should resolve) Goal: Pt Will Go Supine/Side To Sit Outcome: Progressing Flowsheets (Taken 07/12/2023 1548) Pt will go Supine/Side to Sit: with supervision Goal: Patient Will Transfer Sit To/From Stand Outcome: Progressing Flowsheets (Taken 07/12/2023 1548) Patient will transfer sit to/from stand:  with contact guard assist  with minimal assist Goal: Pt Will Transfer Bed To Chair/Chair To Bed Outcome: Progressing Flowsheets (Taken 07/12/2023 1548) Pt will Transfer Bed to Chair/Chair to Bed:  with contact guard assist  with min assist Goal: Pt Will Ambulate Outcome: Progressing Flowsheets (Taken 07/12/2023 1548) Pt will Ambulate:  25 feet  with minimal assist  with rolling walker   3:48 PM, 07/12/23 Ocie Bob, MPT Physical Therapist with [facility] [phone] office [phone] mobile phone

## 2023-07-13 DIAGNOSIS — N179 Acute kidney failure, unspecified: Secondary | ICD-10-CM | POA: Diagnosis not present

## 2023-07-13 DIAGNOSIS — E039 Hypothyroidism, unspecified: Secondary | ICD-10-CM | POA: Diagnosis not present

## 2023-07-13 DIAGNOSIS — I169 Hypertensive crisis, unspecified: Secondary | ICD-10-CM | POA: Diagnosis not present

## 2023-07-13 DIAGNOSIS — J9601 Acute respiratory failure with hypoxia: Secondary | ICD-10-CM | POA: Diagnosis not present

## 2023-07-13 MED ORDER — TAMSULOSIN HCL 0.4 MG PO CAPS
0.4000 mg | ORAL_CAPSULE | Freq: Two times a day (BID) | ORAL | Status: DC
  Administered 2023-07-13 – 2023-07-16 (×7): 0.4 mg via ORAL
  Filled 2023-07-13 (×7): qty 1

## 2023-07-13 NOTE — TOC Progression Note (Addendum)
Transition of Care [facility]) - Progression Note    Patient Details  Name: [name] MRN: [id number] Date of Birth: 1933-09-04  Transition [facility]) CM/SW Contact  Villa Herb, [place] Phone Number: 07/13/2023, 11:28 AM  Clinical Narrative:    CSW spoke with pts daughter at pts request, she confirms they are agreeable to SNF at at D/C. She asked that referral be sent locally with preference of SNF in [place] if possible. Referral sent out to local facilities. TOC to follow.   Addendum 2pm: CSW spoke with pts daughter to review bed offers. CMS Medicare star ratings reviewed. Pts daughter states they accept bed at [facility]. CSW update HUB to reflect this. Auth started and pending at this time. TOC to follow.   Expected Discharge Plan: Skilled Nursing Facility Barriers to Discharge: Continued Medical Work up  Expected Discharge Plan and Services In-house Referral: Clinical Social Work Discharge Planning Services: CM Consult Post Acute Care Choice: Skilled Nursing Facility Living arrangements for the past 2 months: Single Family Home                                       Social Determinants of Health (SDOH) Interventions SDOH Screenings   Food Insecurity: No Food Insecurity (07/08/2023)  Housing: Low Risk  (07/08/2023)  Transportation Needs: No Transportation Needs (07/08/2023)  Utilities: Patient Declined (07/09/2023)  Depression (PHQ2-9): Low Risk  (02/23/2023)  Tobacco Use: Medium Risk (07/07/2023)    Readmission Risk Interventions     No data to display

## 2023-07-13 NOTE — Evaluation (Signed)
Occupational Therapy Evaluation Patient Details Name: [name] MRN: [id number] DOB: 1933/09/18 Today's Date: 07/13/2023   History of Present Illness [name] is a 89 y.o. 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  Patient reports that he has had bilateral peripheral edema for 2 weeks.  It was reported that he had been way less active than normal for the last 2 weeks.  We have been sitting in his chair, as he was extremely short of breath with any exertion at all.  Patient reports that he has not been coughing, has not had a fever.  He denies any chest pain, palpitations, dizziness.  He reports he wears no oxygen at home.  He has been feeling more short of breath over the last 6 months but it has been worse over the last 1 week.  He reports his swelling in his legs has been present for a long time as well, but has been worse over the last 2 weeks.  He denies any missed medications or history of rebound hypertension.  He reports an increase in urine output.  He does feel better since having the oxygen on.  On review of systems he denies nausea, vomiting, fever, dysuria.  He reports he has had some diarrhea.  He denies hematochezia and melena.  Patient denies any obvious signs of bleeding.  He has no other complaints at this time.   Clinical Impression   Pt agreeable to OT evaluation, lying in bed, reports he was up for approximately 1-1.5 hours today and has been back in bed for approximately 30 minutes. Pt demonstrating generalized weakness and poor standing balance. Is able to stand with BUE support and min/mod assist for <1 minute. Pt requiring set-up for all seated tasks, mod assist or greater for all standing tasks and any task requiring functional mobility. Recommend continued skilled OT services to improve independence and safety during  ADLs and functional mobility.        If plan is discharge home, recommend the following: A lot of help with walking and/or transfers;A lot of help with bathing/dressing/bathroom;Assistance with cooking/housework;Assist for transportation;Help with stairs or ramp for entrance    Functional Status Assessment  Patient has had a recent decline in their functional status and demonstrates the ability to make significant improvements in function in a reasonable and predictable amount of time.  Equipment Recommendations  None recommended by OT       Precautions / Restrictions Precautions Precautions: Fall Restrictions Weight Bearing Restrictions: No      Mobility Bed Mobility Overal bed mobility: Needs Assistance Bed Mobility: Supine to Sit, Sit to Supine     Supine to sit: Supervision, HOB elevated Sit to supine: Supervision, HOB elevated   General bed mobility comments: increased time, labored movement    Transfers Overall transfer level: Needs assistance Equipment used: Rolling walker (2 wheels) Transfers: Sit to/from Stand Sit to Stand: Min assist          Lateral/Scoot Transfers: Supervision            ADL either performed or assessed with clinical judgement   ADL Overall ADL's : Needs assistance/impaired Eating/Feeding: Set up;Sitting   Grooming: Set up;Sitting Grooming Details (indicate cue type and reason): Pt unable to stand for tasks. Can complete with set-up in seated position Upper Body Bathing: Set up;Sitting Upper Body Bathing Details (indicate cue type and reason):  Pt can complete with set-up in sitting Lower Body Bathing: Moderate assistance;Sitting/lateral leans;Sit to/from stand Lower Body Bathing Details (indicate cue type and reason): Assist for peri-care, cannot stand and perform independently Upper Body Dressing : Set up;Sitting   Lower Body Dressing: Moderate assistance;Sitting/lateral leans;Sit to/from stand Lower Body Dressing Details  (indicate cue type and reason): Pt unable to stand without BUE support Toilet Transfer: Moderate assistance;Stand-pivot;Rolling walker (2 wheels) Toilet Transfer Details (indicate cue type and reason): simulated, requiring mod assist Toileting- Clothing Manipulation and Hygiene: Moderate assistance;Sitting/lateral lean;Sit to/from stand         General ADL Comments: Pt is requiring increased assistance from baseline due to BLE weakness and balance deficits. Pt is unable to stand to complete ADLs and needs BUE support in standing     Vision Baseline Vision/History: 1 Wears glasses Ability to See in Adequate Light: 1 Impaired Patient Visual Report: No change from baseline Vision Assessment?: No apparent visual deficits Additional Comments: wears glasses at baseline, no changes reported     Perception Perception: Within Functional Limits       Praxis Praxis: WFL       Pertinent Vitals/Pain Pain Assessment Pain Assessment: No/denies pain     Extremity/Trunk Assessment Upper Extremity Assessment Upper Extremity Assessment: Generalized weakness (RUE 4/5, LUE 4-/5)   Lower Extremity Assessment Lower Extremity Assessment: Defer to PT evaluation   Cervical / Trunk Assessment Cervical / Trunk Assessment: Kyphotic   Communication Communication Communication: No apparent difficulties   Cognition Arousal: Alert Behavior During Therapy: WFL for tasks assessed/performed Overall Cognitive Status: Within Functional Limits for tasks assessed                                                  Home Living Family/patient expects to be discharged to:: Private residence Living Arrangements: Alone Available Help at Discharge: Family;Available PRN/intermittently Type of Home: House Home Access: Level entry     Home Layout: One level     Bathroom Shower/Tub: Chief Strategy Officer: Handicapped height     Home Equipment: Agricultural consultant (2  wheels);Cane - quad;Grab bars - tub/shower;BSC/3in1          Prior Functioning/Environment Prior Level of Function : Independent/Modified Independent             Mobility Comments: household and short distanced community ambulator using quad-cane, does not drive ADLs Comments: Independent with household ADLs. Daughter fixes meals, housework, and community I/ADLS        OT Problem List: Decreased strength;Decreased activity tolerance;Impaired balance (sitting and/or standing);Decreased safety awareness;Decreased knowledge of use of DME or AE      OT Treatment/Interventions: Self-care/ADL training;Therapeutic exercise;Therapeutic activities;Patient/family education;DME and/or AE instruction;Energy conservation    OT Goals(Current goals can be found in the care plan section) Acute Rehab OT Goals Patient Stated Goal: To get stronger and go home OT Goal Formulation: With patient Time For Goal Achievement: 07/27/23 Potential to Achieve Goals: Good  OT Frequency: Min 1X/week       AM-PAC OT "6 Clicks" Daily Activity     Outcome Measure Help from another person eating meals?: None Help from another person taking care of personal grooming?: A Little Help from another person toileting, which includes using toliet, bedpan, or urinal?: A Lot Help from another person bathing (including washing, rinsing, drying)?: A Lot Help from another person  to put on and taking off regular upper body clothing?: A Lot Help from another person to put on and taking off regular lower body clothing?: A Lot 6 Click Score: 15   End of Session Equipment Utilized During Treatment: Gait belt;Rolling walker (2 wheels);Oxygen  Activity Tolerance: Patient tolerated treatment well Patient left: in bed;with call bell/phone within reach;with bed alarm set  OT Visit Diagnosis: Unsteadiness on feet (R26.81);Muscle weakness (generalized) (M62.81)                Time: 0865-7846 OT Time Calculation (min): 19  min Charges:  OT General Charges $OT Visit: 1 Visit OT Evaluation $OT Eval Low Complexity: 1 Low  Ezra Sites, OTR/L  [phone] 07/13/2023, 2:27 PM

## 2023-07-13 NOTE — Plan of Care (Signed)

## 2023-07-13 NOTE — Plan of Care (Signed)
  Problem: Acute Rehab OT Goals (only OT should resolve) Goal: Pt. Will Perform Grooming Flowsheets (Taken 07/13/2023 1430) Pt Will Perform Grooming:  with supervision  standing Goal: Pt. Will Perform Lower Body Dressing Flowsheets (Taken 07/13/2023 1430) Pt Will Perform Lower Body Dressing:  with supervision  sitting/lateral leans  sit to/from stand Goal: Pt. Will Transfer To Toilet Flowsheets (Taken 07/13/2023 1430) Pt Will Transfer to Toilet:  with supervision  ambulating  regular height toilet  bedside commode Goal: Pt. Will Perform Toileting-Clothing Manipulation Flowsheets (Taken 07/13/2023 1430) Pt Will Perform Toileting - Clothing Manipulation and hygiene:  with supervision  sitting/lateral leans  sit to/from stand Goal: Pt/Caregiver Will Perform Home Exercise Program Flowsheets (Taken 07/13/2023 1430) Pt/caregiver will Perform Home Exercise Program:  Increased strength  Both right and left upper extremity  Independently  With written HEP provided

## 2023-07-13 NOTE — Progress Notes (Signed)
Progress Note   Patient: [name] NWG:[id number] DOB: 1933-02-02 DOA: 07/07/2023     6 DOS: the patient was seen and examined on 07/13/2023   Brief hospital admission course: [name] was admitted to the hospital with the working diagnosis of COPD exacerbation.    89 y.o. 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  Patient reports that he has had bilateral peripheral edema for 2 weeks. Feeling more short of breath over the last 6 months but it has been worse over the last 1 week.    Chest radiograph with mild cardiomegaly, positive hyperinflation with increased lung markings, no effusions. Positive right hilar density, possible lymph node and right upper lobe peripheral density.    CT chest with severe emphysematous changes centrilobular and pre septal. Traction bronchiectasis.  Increased soft tissue thickening about the right hilum. (Will need follow up with contrast CT) Question enlargement of the right thyroid lobe vs paraesophageal lymphadenopathy.  Question bilateral hydronephrosis.   Assessment and Plan: * Acute respiratory failure with hypoxia (HCC) COPD exacerbation. Severe underlying emphysematous changes with significant dead space.  02 saturation today is 96% on 2 L/min per [place].  -Continue oral steroids, bronchodilator management and wean off oxygen supplementation as tolerated. -Follow the saturation screening. -Continue airway clearing techniques with flutter valve and incentive spirometer.   Acute on chronic diastolic CHF (congestive heart failure) (HCC) Echocardiogram with preserved LV systolic function >75%, moderate LVH, RV systolic function is low normal, RVSP 56,0 mmHg, RA mild to moderate dilatation, small pericardial effusion, circumferential, mild to moderate TR,    -Continue with oral furosemide 40 mg po daily.   -Low GFR limiting further medial therapy.  -Continue daily weights, low-sodium diet and adequate hydration.  Chronic kidney disease, stage 3b (HCC) Improved volume status, renal function with serum cr at 2,1 with K at 3,9 and serum bicarbonate at 23.   -Plan to continue oral furosemide 40 mg po daily. -Continue to follow up renal function in am, avoid hypotension and nephrotoxic medications.   Essential hypertension -Continue blood pressure control with labetalol.   Primary cancer of right upper lobe of lung (HCC) - CTA showing increased soft tissue thickening in the right hilum with mass effect on the right mainstem bronchus and lower lobe bronchi - Patient is no longer on immunotherapy - Likely contributing to his hypoxia - Continue oxygen supplementation as needed -Assess the saturation screening -Continue outpatient follow-up with oncology service.  Hypothyroidism -Continue levothyroxine.  -Possible thyromegaly.  -Continue close outpatient monitoring of patient's thyroid panel  Anemia of chronic disease Iron deficiency with serum iron at 21, with TIBC 168, transferrin saturation 13 and ferritin 164 -Sp IV iron 300 mg iron sucrose.   -Continue to follow hemoglobin trend.   Subjective:  No chest pain, no nausea, no vomiting.  Breathing has continued to improve.  Tolerating nasal cannula in place with good saturation appreciated.  Foley catheter in place without significant signs of hematuria.  Good urine output appreciated.  Physical Exam: Vitals:   07/12/23 2007 07/13/23 0331 07/13/23 0755 07/13/23 1304  BP: (!) 164/85 (!) 173/90  (!) 154/82  Pulse:  67  65  Resp: 18 18  16   Temp: 98.9 F (37.2 C) 98.3 F (36.8 C)  98.4 F (36.9 C)  TempSrc:    Oral  SpO2: 100% 99% 99% 100%  Weight:  Height:       General exam: Alert, awake, following commands appropriately; no chest pain, no nausea, no vomiting.  Reports breathing improved.  Participating with physical  therapy.  Feeling weak, tired and deconditioned. Respiratory system: Improved air movement bilaterally; no using accessory muscle.  2 L nasal colostomy mentation. Cardiovascular system:RRR. No murmurs, rubs, gallops. Gastrointestinal system: Abdomen is nondistended, soft and nontender. No organomegaly or masses felt. Normal bowel sounds heard. Central nervous system: Alert and oriented. No focal neurological deficits. Extremities: No cyanosis or clubbing; trace edema appreciated bilaterally.  Patient demonstrating sound stiffness and lack of flexion around his knees bilaterally. Skin: No petechiae.  Positive vitiligo Psychiatry: Judgement and insight appear normal.  Stable mood.   Family Communication: no family at the bedside; daughter updated over the phone.  Disposition: Status is: Inpatient  Remains inpatient appropriate because: pending PT evaluation   Planned Discharge Destination: Skilled nursing facility   Author: Vassie Loll, MD 07/13/2023 5:24 PM  For on call review [email or web address].

## 2023-07-14 DIAGNOSIS — J9601 Acute respiratory failure with hypoxia: Secondary | ICD-10-CM | POA: Diagnosis not present

## 2023-07-14 LAB — BASIC METABOLIC PANEL
Anion gap: 8 (ref 5–15)
BUN: 38 mg/dL — ABNORMAL HIGH (ref 8–23)
CO2: 24 mmol/L (ref 22–32)
Calcium: 7.8 mg/dL — ABNORMAL LOW (ref 8.9–10.3)
Chloride: 105 mmol/L (ref 98–111)
Creatinine, Ser: 2.31 mg/dL — ABNORMAL HIGH (ref 0.61–1.24)
GFR, Estimated: 27 mL/min — ABNORMAL LOW (ref >60)
Glucose, Bld: 133 mg/dL — ABNORMAL HIGH (ref 70–99)
Potassium: 4.4 mmol/L (ref 3.5–5.1)
Sodium: 137 mmol/L (ref 135–145)

## 2023-07-14 LAB — CBC
HCT: 28.8 % — ABNORMAL LOW (ref 39.0–52.0)
Hemoglobin: 8.6 g/dL — ABNORMAL LOW (ref 13.0–17.0)
MCH: 29.5 pg (ref 26.0–34.0)
MCHC: 29.9 g/dL — ABNORMAL LOW (ref 30.0–36.0)
MCV: 98.6 fL (ref 80.0–100.0)
Platelets: 202 10*3/uL (ref 150–400)
RBC: 2.92 MIL/uL — ABNORMAL LOW (ref 4.22–5.81)
RDW: 16 % — ABNORMAL HIGH (ref 11.5–15.5)
WBC: 4.5 10*3/uL (ref 4.0–10.5)
nRBC: 0 % (ref 0.0–0.2)

## 2023-07-14 MED ORDER — HYDRALAZINE HCL 20 MG/ML IJ SOLN
10.0000 mg | INTRAMUSCULAR | Status: DC | PRN
  Administered 2023-07-14: 10 mg via INTRAVENOUS
  Filled 2023-07-14: qty 1

## 2023-07-14 NOTE — Progress Notes (Signed)
Mobility Specialist Progress Note:    07/14/23 1145  Mobility  Activity Dangled on edge of bed  Level of Assistance Minimal assist, patient does 75% or more  Assistive Device None  Range of Motion/Exercises Active;All extremities  Activity Response Tolerated well  Mobility Referral Yes  $Mobility charge 1 Mobility  Mobility Specialist Start Time (ACUTE ONLY) 1145  Mobility Specialist Stop Time (ACUTE ONLY) 1200  Mobility Specialist Time Calculation (min) (ACUTE ONLY) 15 min   Pt received in bed, agreeable to mobility. RN requested SpO2 check on RA during ambulation, pt states "I can't stand". Required MinA to dangle EOB with no AD. Tolerated fair, baseline SpO2 93% on RA at rest. SpO2 85% on RA sitting EOB. Returned pt supine, recovered SpO2 91% on RA. Notified RN, bed alarm on. All needs met.   [name] Mobility Specialist Please contact via Special educational needs teacher or  Rehab office at [phone]

## 2023-07-14 NOTE — Progress Notes (Signed)
PRN hydralazine effective , patient c/o SOB his o2 sat is 98%     07/14/23 1744  Vitals  BP (!) 145/65  BP Location Right Arm  Pulse Rate 87  Oxygen Therapy  SpO2 98 %  O2 Device Nasal Cannula  O2 Flow Rate (L/min) 1.5 L/min

## 2023-07-14 NOTE — Progress Notes (Signed)
PROGRESS NOTE    [name]  QVZ:[id number] DOB: 07/16/1933 DOA: 07/07/2023 PCP: [name], MD   Brief Narrative:    [name] was admitted to the hospital with the working diagnosis of COPD exacerbation.    89 y.o. 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  Patient reports that he has had bilateral peripheral edema for 2 weeks. Feeling more short of breath over the last 6 months but it has been worse over the last 1 week.   He was admitted with acute hypoxemic respiratory failure secondary to acute COPD exacerbation as well as acute on chronic diastolic CHF exacerbation.  He is now having some complaints of diarrhea.  Assessment & Plan:   Principal Problem:   Acute respiratory failure with hypoxia (HCC) Active Problems:   Acute on chronic diastolic CHF (congestive heart failure) (HCC)   Chronic kidney disease, stage 3b (HCC)   Primary cancer of right upper lobe of lung (HCC)   Essential hypertension   Anemia of chronic disease   Hypothyroidism  Assessment and Plan:   Acute respiratory failure with hypoxia (HCC) COPD exacerbation. Severe underlying emphysematous changes with significant dead space.  02 saturation today is 96% on 2 L/min per [place].  -Continue oral steroids, bronchodilator management and wean off oxygen supplementation as tolerated. -Follow the saturation screening. -Continue airway clearing techniques with flutter valve and incentive spirometer.    Acute on chronic diastolic CHF (congestive heart failure) (HCC) Echocardiogram with preserved LV systolic function >75%, moderate LVH, RV systolic function is low normal, RVSP 56,0 mmHg, RA mild to moderate dilatation, small pericardial effusion, circumferential, mild to moderate TR,     -Continue with oral furosemide 40 mg po daily.  -Low GFR limiting further  medial therapy.  -Continue daily weights, low-sodium diet and adequate hydration.  New onset diarrhea -Check stool pathogen panel -Imodium as needed   Chronic kidney disease, stage 3b (HCC) Improved volume status, renal function with serum cr at 2,1 with K at 3,9 and serum bicarbonate at 23.    -Plan to continue oral furosemide 40 mg po daily. -Continue to follow up renal function in am, avoid hypotension and nephrotoxic medications.    Essential hypertension -Continue blood pressure control with labetalol.    Primary cancer of right upper lobe of lung (HCC) - CTA showing increased soft tissue thickening in the right hilum with mass effect on the right mainstem bronchus and lower lobe bronchi - Patient is no longer on immunotherapy - Likely contributing to his hypoxia - Continue oxygen supplementation as needed -Assess the saturation screening -Continue outpatient follow-up with oncology service.   Hypothyroidism -Continue levothyroxine.  -Possible thyromegaly.  -Continue close outpatient monitoring of patient's thyroid panel   Anemia of chronic disease Iron deficiency with serum iron at 21, with TIBC 168, transferrin saturation 13 and ferritin 164 -Sp IV iron 300 mg iron sucrose.   -Continue to follow hemoglobin trend.   DVT prophylaxis:Heparin Code Status: Full Family Communication: None at bedside Disposition Plan: SNF Status is: Inpatient Remains inpatient appropriate because: Need for IV medications.   Consultants:  None  Procedures:  None  Antimicrobials:  None   Subjective: Patient seen and evaluated today with improvement in overall respiratory status, but noted to have significant amount of loose stools overnight.  He denies any abdominal pain, nausea, or vomiting.  Objective: Vitals:   07/13/23  1304 07/13/23 1945 07/14/23 0404 07/14/23 0903  BP: (!) 154/82 (!) 165/76 (!) 164/91   Pulse: 65 76 78   Resp: 16 18 16    Temp: 98.4 F (36.9 C) 98.3 F  (36.8 C) 98 F (36.7 C)   TempSrc: Oral Oral    SpO2: 100%  98% 92%  Weight:      Height:        Intake/Output Summary (Last 24 hours) at 07/14/2023 1055 Last data filed at 07/14/2023 0300 Gross per 24 hour  Intake 360 ml  Output 1600 ml  Net -1240 ml   Filed Weights   07/07/23 1730 07/08/23 0052  Weight: 63.5 kg 74.4 kg    Examination:  General exam: Appears calm and comfortable  Respiratory system: Clear to auscultation. Respiratory effort normal.  2 L nasal cannula Cardiovascular system: S1 & S2 heard, RRR.  Gastrointestinal system: Abdomen is soft Central nervous system: Alert and awake Extremities: No edema Skin: No significant lesions noted Psychiatry: Flat affect.    Data Reviewed: I have personally reviewed following labs and imaging studies  CBC: Recent Labs  Lab 07/07/23 1818 07/08/23 0441 07/14/23 0416  WBC 5.3 4.4 4.5  NEUTROABS 4.1 4.1  --   HGB 10.2* 8.8* 8.6*  HCT 32.9* 28.9* 28.8*  MCV 95.9 95.7 98.6  PLT 167 164 202   Basic Metabolic Panel: Recent Labs  Lab 07/07/23 1818 07/08/23 0441 07/09/23 0425 07/10/23 0510 07/11/23 0448 07/12/23 0426 07/14/23 0416  NA 137 135 137 140 138 139 137  K 3.9 3.7 4.1 4.0 3.9 4.4 4.4  CL 106 107 107 110 108 108 105  CO2 23 22 24 25 23 26 24   GLUCOSE 98 124* 134* 99 121* 91 133*  BUN 26* 26* 32* 36* 37* 36* 38*  CREATININE 2.04* 1.91* 2.08* 2.12* 2.11* 2.17* 2.31*  CALCIUM 8.0* 7.8* 7.9* 7.6* 7.5* 7.5* 7.8*  MG 1.9 1.9  --   --  1.9  --   --    GFR: Estimated Creatinine Clearance: 23.7 mL/min (A) (by C-G formula based on SCr of 2.31 mg/dL (H)). Liver Function Tests: Recent Labs  Lab 07/07/23 1818 07/08/23 0441  AST 12* 12*  ALT 11 9  ALKPHOS 67 58  BILITOT 0.8 0.8  PROT 7.3 6.1*  ALBUMIN 2.7* 2.2*   No results for input(s): "LIPASE", "AMYLASE" in the last 168 hours. No results for input(s): "AMMONIA" in the last 168 hours. Coagulation Profile: No results for input(s): "INR", "PROTIME"  in the last 168 hours. Cardiac Enzymes: No results for input(s): "CKTOTAL", "CKMB", "CKMBINDEX", "TROPONINI" in the last 168 hours. BNP (last 3 results) No results for input(s): "PROBNP" in the last 8760 hours. HbA1C: No results for input(s): "HGBA1C" in the last 72 hours. CBG: No results for input(s): "GLUCAP" in the last 168 hours. Lipid Profile: No results for input(s): "CHOL", "HDL", "LDLCALC", "TRIG", "CHOLHDL", "LDLDIRECT" in the last 72 hours. Thyroid Function Tests: No results for input(s): "TSH", "T4TOTAL", "FREET4", "T3FREE", "THYROIDAB" in the last 72 hours. Anemia Panel: No results for input(s): "VITAMINB12", "FOLATE", "FERRITIN", "TIBC", "IRON", "RETICCTPCT" in the last 72 hours. Sepsis Labs: Recent Labs  Lab 07/07/23 1818  PROCALCITON 0.11    No results found for this or any previous visit (from the past 240 hour(s)).       Radiology Studies: No results found.      Scheduled Meds:  Chlorhexidine Gluconate Cloth  6 each Topical Daily   fluticasone furoate-vilanterol  1 puff Inhalation Daily  furosemide  40 mg Oral Daily   heparin  5,000 Units Subcutaneous Q8H   labetalol  200 mg Oral BID   levothyroxine  150 mcg Oral Daily   pantoprazole  40 mg Oral Daily   predniSONE  40 mg Oral Q breakfast   tamsulosin  0.4 mg Oral BID     LOS: 7 days    Time spent: 35 minutes    Brian Tesmer Hoover Brunette, DO [facility]  If 7PM-7AM, please contact night-coverage [email or web address] 07/14/2023, 10:55 AM

## 2023-07-14 NOTE — TOC Progression Note (Signed)
Transition of Care [facility]) - Progression Note    Patient Details  Name: [name] MRN: [id number] Date of Birth: 14-May-1933  Transition of [facility]) CM/SW Contact  [place], [place] Phone Number: 07/14/2023, 10:04 AM  Clinical Narrative:    CSW confirmed pts SNF insurance [employer] is still pending at this time. CSW updated Kerri with [facility] of this. TOC to follow.   Expected Discharge Plan: Skilled Nursing Facility Barriers to Discharge: Continued Medical Work up  Expected Discharge Plan and Services In-house Referral: Clinical Social Work Discharge Planning Services: CM Consult Post Acute Care Choice: Skilled Nursing Facility Living arrangements for the past 2 months: Single Family Home                                       Social Determinants of Health (SDOH) Interventions SDOH Screenings   Food Insecurity: No Food Insecurity (07/08/2023)  Housing: Low Risk  (07/08/2023)  Transportation Needs: No Transportation Needs (07/08/2023)  Utilities: Patient Declined (07/09/2023)  Depression (PHQ2-9): Low Risk  (02/23/2023)  Tobacco Use: Medium Risk (07/07/2023)    Readmission Risk Interventions     No data to display

## 2023-07-14 NOTE — Progress Notes (Cosign Needed Addendum)
SATURATION QUALIFICATIONS: (This note is used to comply with regulatory documentation for home oxygen)  Patient Saturations on Room Air at Rest = 91%  Patient Saturations on Room Air while Ambulating = 85 with movement with mobility specialist %  Patient Saturations on 2 Liters of oxygen while lying in bed recovered to 91% room air and 94% with o2 via nasal canula . Please briefly explain why patient needs home oxygen: Patient will continue to need oxygen with movement as his oxygen level falls below 90 % with movement

## 2023-07-15 DIAGNOSIS — J9601 Acute respiratory failure with hypoxia: Secondary | ICD-10-CM | POA: Diagnosis not present

## 2023-07-15 LAB — BASIC METABOLIC PANEL
Anion gap: 7 (ref 5–15)
BUN: 39 mg/dL — ABNORMAL HIGH (ref 8–23)
CO2: 26 mmol/L (ref 22–32)
Calcium: 7.7 mg/dL — ABNORMAL LOW (ref 8.9–10.3)
Chloride: 104 mmol/L (ref 98–111)
Creatinine, Ser: 2.23 mg/dL — ABNORMAL HIGH (ref 0.61–1.24)
GFR, Estimated: 28 mL/min — ABNORMAL LOW (ref >60)
Glucose, Bld: 105 mg/dL — ABNORMAL HIGH (ref 70–99)
Potassium: 4.3 mmol/L (ref 3.5–5.1)
Sodium: 137 mmol/L (ref 135–145)

## 2023-07-15 LAB — CBC
HCT: 27.1 % — ABNORMAL LOW (ref 39.0–52.0)
Hemoglobin: 8.3 g/dL — ABNORMAL LOW (ref 13.0–17.0)
MCH: 30.1 pg (ref 26.0–34.0)
MCHC: 30.6 g/dL (ref 30.0–36.0)
MCV: 98.2 fL (ref 80.0–100.0)
Platelets: 181 10*3/uL (ref 150–400)
RBC: 2.76 MIL/uL — ABNORMAL LOW (ref 4.22–5.81)
RDW: 16.4 % — ABNORMAL HIGH (ref 11.5–15.5)
WBC: 3.9 10*3/uL — ABNORMAL LOW (ref 4.0–10.5)
nRBC: 0 % (ref 0.0–0.2)

## 2023-07-15 LAB — MAGNESIUM: Magnesium: 1.8 mg/dL (ref 1.7–2.4)

## 2023-07-15 MED ORDER — FUROSEMIDE 40 MG PO TABS: 40.0000 mg | ORAL_TABLET | Freq: Every day | ORAL | 0 refills | Status: DC

## 2023-07-15 MED ORDER — COMBIVENT RESPIMAT 20-100 MCG/ACT IN AERS: 1.0000 | INHALATION_SPRAY | Freq: Four times a day (QID) | RESPIRATORY_TRACT | 1 refills | Status: DC | PRN

## 2023-07-15 MED ORDER — FLUTICASONE FUROATE-VILANTEROL 100-25 MCG/ACT IN AEPB: 1.0000 | INHALATION_SPRAY | Freq: Every day | RESPIRATORY_TRACT | 0 refills | Status: DC

## 2023-07-15 NOTE — Discharge Summary (Signed)
gabapentin 250 MG/5ML solution Commonly known as: NEURONTIN Take 3 mLs (150 mg total) by mouth at bedtime. What changed:  when to take this reasons to take this   ibuprofen 200 MG tablet Commonly known as: ADVIL Take 200 mg by mouth every 6 (six) hours as needed for mild pain.   labetalol 200 MG tablet Commonly known as: NORMODYNE Take 200 mg by mouth 2 (two) times daily.   levothyroxine 150 MCG tablet Commonly known as: SYNTHROID Take 150 mcg by mouth  daily.   loperamide 2 MG capsule Commonly known as: IMODIUM Take by mouth as needed for diarrhea or loose stools.   tadalafil 5 MG tablet Commonly known as: CIALIS Take 5 mg by mouth daily.   tamsulosin 0.4 MG Caps capsule Commonly known as: FLOMAX Take 0.4 mg by mouth daily.        Contact information for follow-up providers     [name], MD. Schedule an appointment as soon as possible for a visit in 1 week(s).   Specialty: Internal Medicine Contact information: [address] [name] [address] [phone]              Contact information for after-discharge care     Destination     [facility] .   Service: Skilled Nursing Contact information: [address] [place] [address] [phone]                    No Known Allergies  Consultations: None   Procedures/Studies: ECHOCARDIOGRAM COMPLETE  Result Date: 07/09/2023    ECHOCARDIOGRAM REPORT   Patient Name:   [name] Date of Exam: 07/09/2023 Medical Rec #:  [id number]         Height:       74.0 in Accession #:    [id number]        Weight:       164.0 lb Date of Birth:  03/06/1933        BSA:          1.998 m Patient Age:    89 years          BP:           151/86 mmHg Patient Gender: M                 HR:           81 bpm. [name] Procedure: 2D Echo, Cardiac Doppler and Color Doppler Indications:    Dyspnea  History:        Patient has no prior history of Echocardiogram examinations.                 Signs/Symptoms:Edema and Shortness of Breath; Risk                 Factors:Hypertension. Lung CA, hx of immunotherapy.  Sonographer:    Milda Smart Referring Phys: [id number] CARLOS MADERA  Sonographer Comments: Suboptimal subcostal window. Image acquisition challenging due to respiratory motion. IMPRESSIONS  1. Left ventricular ejection fraction, by estimation, is >75%. The left ventricle has hyperdynamic function. The  left ventricle has no regional wall motion abnormalities. There is moderate left ventricular hypertrophy. Left ventricular diastolic parameters are indeterminate.  2. Right ventricular systolic function is low normal. The right ventricular size is normal. There is moderately elevated pulmonary artery systolic pressure. The estimated right ventricular systolic pressure is 56.0 mmHg.  3. Right atrial  gabapentin 250 MG/5ML solution Commonly known as: NEURONTIN Take 3 mLs (150 mg total) by mouth at bedtime. What changed:  when to take this reasons to take this   ibuprofen 200 MG tablet Commonly known as: ADVIL Take 200 mg by mouth every 6 (six) hours as needed for mild pain.   labetalol 200 MG tablet Commonly known as: NORMODYNE Take 200 mg by mouth 2 (two) times daily.   levothyroxine 150 MCG tablet Commonly known as: SYNTHROID Take 150 mcg by mouth  daily.   loperamide 2 MG capsule Commonly known as: IMODIUM Take by mouth as needed for diarrhea or loose stools.   tadalafil 5 MG tablet Commonly known as: CIALIS Take 5 mg by mouth daily.   tamsulosin 0.4 MG Caps capsule Commonly known as: FLOMAX Take 0.4 mg by mouth daily.        Contact information for follow-up providers     [name], MD. Schedule an appointment as soon as possible for a visit in 1 week(s).   Specialty: Internal Medicine Contact information: [address] [name] [address] [phone]              Contact information for after-discharge care     Destination     [facility] .   Service: Skilled Nursing Contact information: [address] [place] [address] [phone]                    No Known Allergies  Consultations: None   Procedures/Studies: ECHOCARDIOGRAM COMPLETE  Result Date: 07/09/2023    ECHOCARDIOGRAM REPORT   Patient Name:   [name] Date of Exam: 07/09/2023 Medical Rec #:  [id number]         Height:       74.0 in Accession #:    [id number]        Weight:       164.0 lb Date of Birth:  03/06/1933        BSA:          1.998 m Patient Age:    89 years          BP:           151/86 mmHg Patient Gender: M                 HR:           81 bpm. [name] Procedure: 2D Echo, Cardiac Doppler and Color Doppler Indications:    Dyspnea  History:        Patient has no prior history of Echocardiogram examinations.                 Signs/Symptoms:Edema and Shortness of Breath; Risk                 Factors:Hypertension. Lung CA, hx of immunotherapy.  Sonographer:    Milda Smart Referring Phys: [id number] CARLOS MADERA  Sonographer Comments: Suboptimal subcostal window. Image acquisition challenging due to respiratory motion. IMPRESSIONS  1. Left ventricular ejection fraction, by estimation, is >75%. The left ventricle has hyperdynamic function. The  left ventricle has no regional wall motion abnormalities. There is moderate left ventricular hypertrophy. Left ventricular diastolic parameters are indeterminate.  2. Right ventricular systolic function is low normal. The right ventricular size is normal. There is moderately elevated pulmonary artery systolic pressure. The estimated right ventricular systolic pressure is 56.0 mmHg.  3. Right atrial  Physician Discharge Summary  [name] WGN:[id number] DOB: May 01, 1933 DOA: 07/07/2023  PCP: [name], MD  Admit date: 07/07/2023  Discharge date: 07/15/2023  Admitted From:SNF  Disposition:  SNF  Recommendations for Outpatient Follow-up:  Follow up with PCP in 1-2 weeks Continue now on Lasix with HCTZ discontinued Monitor BMP in 1 week Continue other medications as noted below  Home Health: None  Equipment/Devices: 2 L nasal cannula oxygen  Discharge Condition:Stable  CODE STATUS: Full  Diet recommendation: Heart Healthy  Brief/Interim Summary: 89 y.o. 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  Patient reports that he has had bilateral peripheral edema for 2 weeks. Feeling more short of breath over the last 6 months but it has been worse over the last 1 week.    He was admitted with acute hypoxemic respiratory failure secondary to acute COPD exacerbation as well as acute on chronic diastolic CHF exacerbation.  He has done well with treatment on steroids as well as oral Lasix.  He is now requiring 2 L nasal cannula on discharge.  He did have some mild, self-limiting diarrhea which has now resolved.  No other acute events or concerns noted throughout the course of this admission.  Discharge Diagnoses:  Principal Problem:   Acute respiratory failure with hypoxia (HCC) Active Problems:   Acute on chronic diastolic CHF (congestive heart failure) (HCC)   Chronic kidney disease, stage 3b (HCC)   Primary cancer of right upper lobe of lung (HCC)   Essential hypertension   Anemia of chronic disease   Hypothyroidism  Principal discharge diagnosis: Acute hypoxemic respiratory failure-multifactorial secondary to acute COPD exacerbation as well as acute on chronic diastolic CHF exacerbation.  Discharge Instructions  Discharge  Instructions     (HEART FAILURE PATIENTS) Call MD:  Anytime you have any of the following symptoms: 1) 3 pound weight gain in 24 hours or 5 pounds in 1 week 2) shortness of breath, with or without a dry hacking cough 3) swelling in the hands, feet or stomach 4) if you have to sleep on extra pillows at night in order to breathe.   Complete by: As directed    Diet - low sodium heart healthy   Complete by: As directed    Increase activity slowly   Complete by: As directed       Allergies as of 07/15/2023   No Known Allergies      Medication List     STOP taking these medications    hydrochlorothiazide 12.5 MG capsule Commonly known as: MICROZIDE       TAKE these medications    amLODipine 10 MG tablet Commonly known as: NORVASC Take 10 mg by mouth daily as needed.   Combivent Respimat 20-100 MCG/ACT Aers respimat Generic drug: Ipratropium-Albuterol Inhale 1 puff into the lungs every 6 (six) hours as needed.   diclofenac Sodium 1 % Gel Commonly known as: VOLTAREN APPLY 2 GRAMS TO THE AFFECTED AREA(S) BY TOPICAL ROUTE 2 TIMES PER DAY   ferrous sulfate 325 (65 FE) MG EC tablet Take 325 mg by mouth daily with breakfast.   fluticasone furoate-vilanterol 100-25 MCG/ACT Aepb Commonly known as: BREO ELLIPTA Inhale 1 puff into the lungs daily. Start taking on: July 16, 2023   furosemide 40 MG tablet Commonly known as: LASIX Take 1 tablet (40 mg total) by mouth daily. Start taking on: July 16, 2023  gabapentin 250 MG/5ML solution Commonly known as: NEURONTIN Take 3 mLs (150 mg total) by mouth at bedtime. What changed:  when to take this reasons to take this   ibuprofen 200 MG tablet Commonly known as: ADVIL Take 200 mg by mouth every 6 (six) hours as needed for mild pain.   labetalol 200 MG tablet Commonly known as: NORMODYNE Take 200 mg by mouth 2 (two) times daily.   levothyroxine 150 MCG tablet Commonly known as: SYNTHROID Take 150 mcg by mouth  daily.   loperamide 2 MG capsule Commonly known as: IMODIUM Take by mouth as needed for diarrhea or loose stools.   tadalafil 5 MG tablet Commonly known as: CIALIS Take 5 mg by mouth daily.   tamsulosin 0.4 MG Caps capsule Commonly known as: FLOMAX Take 0.4 mg by mouth daily.        Contact information for follow-up providers     [name], MD. Schedule an appointment as soon as possible for a visit in 1 week(s).   Specialty: Internal Medicine Contact information: [address] [name] [address] [phone]              Contact information for after-discharge care     Destination     [facility] .   Service: Skilled Nursing Contact information: [address] [place] [address] [phone]                    No Known Allergies  Consultations: None   Procedures/Studies: ECHOCARDIOGRAM COMPLETE  Result Date: 07/09/2023    ECHOCARDIOGRAM REPORT   Patient Name:   [name] Date of Exam: 07/09/2023 Medical Rec #:  [id number]         Height:       74.0 in Accession #:    [id number]        Weight:       164.0 lb Date of Birth:  03/06/1933        BSA:          1.998 m Patient Age:    89 years          BP:           151/86 mmHg Patient Gender: M                 HR:           81 bpm. [name] Procedure: 2D Echo, Cardiac Doppler and Color Doppler Indications:    Dyspnea  History:        Patient has no prior history of Echocardiogram examinations.                 Signs/Symptoms:Edema and Shortness of Breath; Risk                 Factors:Hypertension. Lung CA, hx of immunotherapy.  Sonographer:    Milda Smart Referring Phys: [id number] CARLOS MADERA  Sonographer Comments: Suboptimal subcostal window. Image acquisition challenging due to respiratory motion. IMPRESSIONS  1. Left ventricular ejection fraction, by estimation, is >75%. The left ventricle has hyperdynamic function. The  left ventricle has no regional wall motion abnormalities. There is moderate left ventricular hypertrophy. Left ventricular diastolic parameters are indeterminate.  2. Right ventricular systolic function is low normal. The right ventricular size is normal. There is moderately elevated pulmonary artery systolic pressure. The estimated right ventricular systolic pressure is 56.0 mmHg.  3. Right atrial  Physician Discharge Summary  [name] WGN:[id number] DOB: May 01, 1933 DOA: 07/07/2023  PCP: [name], MD  Admit date: 07/07/2023  Discharge date: 07/15/2023  Admitted From:SNF  Disposition:  SNF  Recommendations for Outpatient Follow-up:  Follow up with PCP in 1-2 weeks Continue now on Lasix with HCTZ discontinued Monitor BMP in 1 week Continue other medications as noted below  Home Health: None  Equipment/Devices: 2 L nasal cannula oxygen  Discharge Condition:Stable  CODE STATUS: Full  Diet recommendation: Heart Healthy  Brief/Interim Summary: 89 y.o. 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  Patient reports that he has had bilateral peripheral edema for 2 weeks. Feeling more short of breath over the last 6 months but it has been worse over the last 1 week.    He was admitted with acute hypoxemic respiratory failure secondary to acute COPD exacerbation as well as acute on chronic diastolic CHF exacerbation.  He has done well with treatment on steroids as well as oral Lasix.  He is now requiring 2 L nasal cannula on discharge.  He did have some mild, self-limiting diarrhea which has now resolved.  No other acute events or concerns noted throughout the course of this admission.  Discharge Diagnoses:  Principal Problem:   Acute respiratory failure with hypoxia (HCC) Active Problems:   Acute on chronic diastolic CHF (congestive heart failure) (HCC)   Chronic kidney disease, stage 3b (HCC)   Primary cancer of right upper lobe of lung (HCC)   Essential hypertension   Anemia of chronic disease   Hypothyroidism  Principal discharge diagnosis: Acute hypoxemic respiratory failure-multifactorial secondary to acute COPD exacerbation as well as acute on chronic diastolic CHF exacerbation.  Discharge Instructions  Discharge  Instructions     (HEART FAILURE PATIENTS) Call MD:  Anytime you have any of the following symptoms: 1) 3 pound weight gain in 24 hours or 5 pounds in 1 week 2) shortness of breath, with or without a dry hacking cough 3) swelling in the hands, feet or stomach 4) if you have to sleep on extra pillows at night in order to breathe.   Complete by: As directed    Diet - low sodium heart healthy   Complete by: As directed    Increase activity slowly   Complete by: As directed       Allergies as of 07/15/2023   No Known Allergies      Medication List     STOP taking these medications    hydrochlorothiazide 12.5 MG capsule Commonly known as: MICROZIDE       TAKE these medications    amLODipine 10 MG tablet Commonly known as: NORVASC Take 10 mg by mouth daily as needed.   Combivent Respimat 20-100 MCG/ACT Aers respimat Generic drug: Ipratropium-Albuterol Inhale 1 puff into the lungs every 6 (six) hours as needed.   diclofenac Sodium 1 % Gel Commonly known as: VOLTAREN APPLY 2 GRAMS TO THE AFFECTED AREA(S) BY TOPICAL ROUTE 2 TIMES PER DAY   ferrous sulfate 325 (65 FE) MG EC tablet Take 325 mg by mouth daily with breakfast.   fluticasone furoate-vilanterol 100-25 MCG/ACT Aepb Commonly known as: BREO ELLIPTA Inhale 1 puff into the lungs daily. Start taking on: July 16, 2023   furosemide 40 MG tablet Commonly known as: LASIX Take 1 tablet (40 mg total) by mouth daily. Start taking on: July 16, 2023  gabapentin 250 MG/5ML solution Commonly known as: NEURONTIN Take 3 mLs (150 mg total) by mouth at bedtime. What changed:  when to take this reasons to take this   ibuprofen 200 MG tablet Commonly known as: ADVIL Take 200 mg by mouth every 6 (six) hours as needed for mild pain.   labetalol 200 MG tablet Commonly known as: NORMODYNE Take 200 mg by mouth 2 (two) times daily.   levothyroxine 150 MCG tablet Commonly known as: SYNTHROID Take 150 mcg by mouth  daily.   loperamide 2 MG capsule Commonly known as: IMODIUM Take by mouth as needed for diarrhea or loose stools.   tadalafil 5 MG tablet Commonly known as: CIALIS Take 5 mg by mouth daily.   tamsulosin 0.4 MG Caps capsule Commonly known as: FLOMAX Take 0.4 mg by mouth daily.        Contact information for follow-up providers     [name], MD. Schedule an appointment as soon as possible for a visit in 1 week(s).   Specialty: Internal Medicine Contact information: [address] [name] [address] [phone]              Contact information for after-discharge care     Destination     [facility] .   Service: Skilled Nursing Contact information: [address] [place] [address] [phone]                    No Known Allergies  Consultations: None   Procedures/Studies: ECHOCARDIOGRAM COMPLETE  Result Date: 07/09/2023    ECHOCARDIOGRAM REPORT   Patient Name:   [name] Date of Exam: 07/09/2023 Medical Rec #:  [id number]         Height:       74.0 in Accession #:    [id number]        Weight:       164.0 lb Date of Birth:  03/06/1933        BSA:          1.998 m Patient Age:    89 years          BP:           151/86 mmHg Patient Gender: M                 HR:           81 bpm. [name] Procedure: 2D Echo, Cardiac Doppler and Color Doppler Indications:    Dyspnea  History:        Patient has no prior history of Echocardiogram examinations.                 Signs/Symptoms:Edema and Shortness of Breath; Risk                 Factors:Hypertension. Lung CA, hx of immunotherapy.  Sonographer:    Milda Smart Referring Phys: [id number] CARLOS MADERA  Sonographer Comments: Suboptimal subcostal window. Image acquisition challenging due to respiratory motion. IMPRESSIONS  1. Left ventricular ejection fraction, by estimation, is >75%. The left ventricle has hyperdynamic function. The  left ventricle has no regional wall motion abnormalities. There is moderate left ventricular hypertrophy. Left ventricular diastolic parameters are indeterminate.  2. Right ventricular systolic function is low normal. The right ventricular size is normal. There is moderately elevated pulmonary artery systolic pressure. The estimated right ventricular systolic pressure is 56.0 mmHg.  3. Right atrial

## 2023-07-15 NOTE — Plan of Care (Signed)
  Problem: Education: Goal: Knowledge of General Education information will improve Description Including pain rating scale, medication(s)/side effects and non-pharmacologic comfort measures Outcome: Progressing   

## 2023-07-15 NOTE — TOC Progression Note (Signed)
Transition of Care [facility]) - Progression Note    Patient Details  Name: VIYAN ROSAMOND MRN: [id number] Date of Birth: [birth year]  Transition of [facility]) CM/SW Contact  [place], [place] Phone Number: 07/15/2023, 10:09 AM  Clinical Narrative:    Pts insurance [employer] is still pending for SNF placement. TOC continues to follow.   Expected Discharge Plan: Skilled Nursing Facility Barriers to Discharge: Continued Medical Work up  Expected Discharge Plan and Services In-house Referral: Clinical Social Work Discharge Planning Services: CM Consult Post Acute Care Choice: Skilled Nursing Facility Living arrangements for the past 2 months: Single Family Home Expected Discharge Date: 07/15/23                                     Social Determinants of Health (SDOH) Interventions SDOH Screenings   Food Insecurity: No Food Insecurity (07/08/2023)  Housing: Low Risk  (07/08/2023)  Transportation Needs: No Transportation Needs (07/08/2023)  Utilities: Patient Declined (07/09/2023)  Depression (PHQ2-9): Low Risk  (02/23/2023)  Tobacco Use: Medium Risk (07/07/2023)    Readmission Risk Interventions     No data to display

## 2023-07-15 NOTE — Progress Notes (Signed)
Physical Therapy Treatment Patient Details Name: [name] MRN: [id number] DOB: 05/14/1933 Today's Date: 07/15/2023   History of Present Illness [name] is a 89 y.o. 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  Patient reports that he has had bilateral peripheral edema for 2 weeks.  It was reported that he had been way less active than normal for the last 2 weeks.  We have been sitting in his chair, as he was extremely short of breath with any exertion at all.  Patient reports that he has not been coughing, has not had a fever.  He denies any chest pain, palpitations, dizziness.  He reports he wears no oxygen at home.  He has been feeling more short of breath over the last 6 months but it has been worse over the last 1 week.  He reports his swelling in his legs has been present for a long time as well, but has been worse over the last 2 weeks.  He denies any missed medications or history of rebound hypertension.  He reports an increase in urine output.  He does feel better since having the oxygen on.  On review of systems he denies nausea, vomiting, fever, dysuria.  He reports he has had some diarrhea.  He denies hematochezia and melena.  Patient denies any obvious signs of bleeding.  He has no other complaints at this time.    PT Comments  Pt supine and willing to participate with therapy today.  Min A with bed mobility, transfer training and gait training.  Pt presents with slow labored movements due to weakness.  Transfer safely to [facility]  bowel movement then short distance with RW.  Pt unable to fully extend knees and reports of some pain with movement Rt>Lt, monitored through session.  No reports of increased pain, was limited by fatigue and weakness.  Pt left in chair with call bell within reach and NT in room.   If plan is discharge home,  recommend the following:     Can travel by private vehicle        Equipment Recommendations       Recommendations for Other Services       Precautions / Restrictions Precautions Precautions: Fall Restrictions Weight Bearing Restrictions: No     Mobility  Bed Mobility Overal bed mobility: Modified Independent       Supine to sit: Supervision     General bed mobility comments: increased time, labored movement    Transfers Overall transfer level: Modified independent Equipment used: Rolling walker (2 wheels) Transfers: Sit to/from Stand Sit to Stand: Contact guard assist, Min assist   Step pivot transfers: Min assist (bed to [facility])       General transfer comment: cueing for hand placement, nose over toes to increase ease wiht standing, labored movements, unsteady upon standing    Ambulation/Gait   Gait Distance (Feet): 8 Feet Assistive device: Rolling walker (2 wheels)   Gait velocity: slow     General Gait Details: limited to a few slow labored side steps due to fatigue and generalized weakness   Stairs             Wheelchair Mobility     Tilt Bed    Modified Rankin (Stroke Patients Only)       Balance  Cognition Arousal: Alert Behavior During Therapy: WFL for tasks assessed/performed Overall Cognitive Status: Within Functional Limits for tasks assessed                                          Exercises General Exercises - Lower Extremity Long Arc Quad: AROM, Both, 5 reps, Seated, 10 reps Hip Flexion/Marching: AROM, Strengthening, 10 reps, Seated, Both Heel Raises: AROM, Both, 10 reps, Seated    General Comments        Pertinent Vitals/Pain Pain Assessment Pain Assessment: 0-10 Pain Score: 5  Pain Location: knee pain Rt > Lt with movement Pain Descriptors / Indicators: Discomfort, Aching Pain Intervention(s): Limited activity within patient's  tolerance, Monitored during session, Repositioned    Home Living                          Prior Function            PT Goals (current goals can now be found in the care plan section)      Frequency           PT Plan      Co-evaluation              AM-PAC PT "6 Clicks" Mobility   Outcome Measure  Help needed turning from your back to your side while in a flat bed without using bedrails?: A Little Help needed moving from lying on your back to sitting on the side of a flat bed without using bedrails?: A Little Help needed moving to and from a bed to a chair (including a wheelchair)?: A Lot Help needed standing up from a chair using your arms (e.g., wheelchair or bedside chair)?: A Lot Help needed to walk in hospital room?: A Lot Help needed climbing 3-5 steps with a railing? : A Lot 6 Click Score: 14    End of Session Equipment Utilized During Treatment: Oxygen;Gait belt Activity Tolerance: Patient tolerated treatment well;Patient limited by fatigue Patient left: in chair;with call bell/phone within reach;with chair alarm set Nurse Communication: Mobility status PT Visit Diagnosis: Unsteadiness on feet (R26.81);Other abnormalities of gait and mobility (R26.89);Muscle weakness (generalized) (M62.81)     Time: 1610-9604 PT Time Calculation (min) (ACUTE ONLY): 30 min  Charges:    $Therapeutic Activity: 23-37 mins PT General Charges $$ ACUTE PT VISIT: 1 Visit                    Becky Sax, LPTA/CLT; CBIS [phone]  Juel Burrow 07/15/2023, 12:15 PM

## 2023-07-15 NOTE — Care Management Important Message (Signed)
Important Message  Patient Details  Name: [name] MRN: [id number] Date of Birth: [birth year]   Important Message Given:  Yes - Medicare IM     Corey Harold 07/15/2023, 2:14 PM

## 2023-07-16 DIAGNOSIS — J441 Chronic obstructive pulmonary disease with (acute) exacerbation: Secondary | ICD-10-CM | POA: Diagnosis not present

## 2023-07-16 DIAGNOSIS — I10 Essential (primary) hypertension: Secondary | ICD-10-CM | POA: Diagnosis not present

## 2023-07-16 DIAGNOSIS — R600 Localized edema: Secondary | ICD-10-CM | POA: Diagnosis not present

## 2023-07-16 DIAGNOSIS — R498 Other voice and resonance disorders: Secondary | ICD-10-CM | POA: Diagnosis not present

## 2023-07-16 DIAGNOSIS — L80 Vitiligo: Secondary | ICD-10-CM | POA: Diagnosis not present

## 2023-07-16 DIAGNOSIS — D638 Anemia in other chronic diseases classified elsewhere: Secondary | ICD-10-CM | POA: Diagnosis not present

## 2023-07-16 DIAGNOSIS — D6489 Other specified anemias: Secondary | ICD-10-CM | POA: Diagnosis not present

## 2023-07-16 DIAGNOSIS — R262 Difficulty in walking, not elsewhere classified: Secondary | ICD-10-CM | POA: Diagnosis not present

## 2023-07-16 DIAGNOSIS — E43 Unspecified severe protein-calorie malnutrition: Secondary | ICD-10-CM | POA: Diagnosis not present

## 2023-07-16 DIAGNOSIS — I5033 Acute on chronic diastolic (congestive) heart failure: Secondary | ICD-10-CM | POA: Diagnosis not present

## 2023-07-16 DIAGNOSIS — R488 Other symbolic dysfunctions: Secondary | ICD-10-CM | POA: Diagnosis not present

## 2023-07-16 DIAGNOSIS — C3411 Malignant neoplasm of upper lobe, right bronchus or lung: Secondary | ICD-10-CM | POA: Diagnosis not present

## 2023-07-16 DIAGNOSIS — I5032 Chronic diastolic (congestive) heart failure: Secondary | ICD-10-CM | POA: Diagnosis not present

## 2023-07-16 DIAGNOSIS — R0602 Shortness of breath: Secondary | ICD-10-CM | POA: Diagnosis not present

## 2023-07-16 DIAGNOSIS — N400 Enlarged prostate without lower urinary tract symptoms: Secondary | ICD-10-CM | POA: Diagnosis not present

## 2023-07-16 DIAGNOSIS — M6281 Muscle weakness (generalized): Secondary | ICD-10-CM | POA: Diagnosis not present

## 2023-07-16 DIAGNOSIS — I110 Hypertensive heart disease with heart failure: Secondary | ICD-10-CM | POA: Diagnosis not present

## 2023-07-16 DIAGNOSIS — E039 Hypothyroidism, unspecified: Secondary | ICD-10-CM | POA: Diagnosis not present

## 2023-07-16 DIAGNOSIS — J9601 Acute respiratory failure with hypoxia: Secondary | ICD-10-CM | POA: Diagnosis not present

## 2023-07-16 DIAGNOSIS — C3412 Malignant neoplasm of upper lobe, left bronchus or lung: Secondary | ICD-10-CM | POA: Diagnosis not present

## 2023-07-16 DIAGNOSIS — N184 Chronic kidney disease, stage 4 (severe): Secondary | ICD-10-CM | POA: Diagnosis not present

## 2023-07-16 DIAGNOSIS — Z9981 Dependence on supplemental oxygen: Secondary | ICD-10-CM | POA: Diagnosis not present

## 2023-07-16 DIAGNOSIS — N1832 Chronic kidney disease, stage 3b: Secondary | ICD-10-CM | POA: Diagnosis not present

## 2023-07-16 DIAGNOSIS — G894 Chronic pain syndrome: Secondary | ICD-10-CM | POA: Diagnosis not present

## 2023-07-16 DIAGNOSIS — I129 Hypertensive chronic kidney disease with stage 1 through stage 4 chronic kidney disease, or unspecified chronic kidney disease: Secondary | ICD-10-CM | POA: Diagnosis not present

## 2023-07-16 NOTE — TOC Progression Note (Signed)
Transition of Care [facility]) - Progression Note    Patient Details  Name: [name] MRN: [id number] Date of Birth: 1933/04/13  Transition of Care [facility]) CM/SW Contact  Villa Herb, [place] Phone Number: 07/16/2023, 10:47 AM  Clinical Narrative:    Pts insurance Berkley Harvey is still pending with [employer] at this time. TOC continues to follow for auth status. Kerri at [facility] updated. Avoidable days entered. TOC to follow.   Expected Discharge Plan: Skilled Nursing Facility Barriers to Discharge: Continued Medical Work up  Expected Discharge Plan and Services In-house Referral: Clinical Social Work Discharge Planning Services: CM Consult Post Acute Care Choice: Skilled Nursing Facility Living arrangements for the past 2 months: Single Family Home Expected Discharge Date: 07/15/23                                     Social Determinants of Health (SDOH) Interventions SDOH Screenings   Food Insecurity: No Food Insecurity (07/08/2023)  Housing: Low Risk  (07/08/2023)  Transportation Needs: No Transportation Needs (07/08/2023)  Utilities: Patient Declined (07/09/2023)  Depression (PHQ2-9): Low Risk  (02/23/2023)  Tobacco Use: Medium Risk (07/07/2023)    Readmission Risk Interventions     No data to display

## 2023-07-16 NOTE — TOC Transition Note (Signed)
Transition of Care [facility]) - CM/SW Discharge Note   Patient Details  Name: [name] MRN: [id number] Date of Birth: 1933-01-01  Transition of Care [facility]) CM/SW Contact:  Villa Herb, LCSWA Phone Number: 07/16/2023, 11:34 AM   Clinical Narrative:    CSW updated that pts insurance auth has been approved for SNF at [facility]. CSW updated Kerri at facility who states they are ready to accept. Pts daughter aware. CSW updated RN to call report and pt is ready to D/C. TOC signing off.   Final next level of care: Skilled Nursing Facility Barriers to Discharge: Continued Medical Work up   Patient Goals and CMS Choice CMS Medicare.gov Compare Post Acute Care list provided to:: Patient Choice offered to / list presented to : Patient, Adult Children  Discharge Placement                Patient chooses bed at: [facility] Patient to be transferred to facility by: facility staff Name of family member notified: daughter Patient and family notified of of transfer: 07/16/23  Discharge Plan and Services Additional resources added to the After Visit Summary for   In-house Referral: Clinical Social Work Discharge Planning Services: CM Consult Post Acute Care Choice: Skilled Nursing Facility                               Social Determinants of Health (SDOH) Interventions SDOH Screenings   Food Insecurity: No Food Insecurity (07/08/2023)  Housing: Low Risk  (07/08/2023)  Transportation Needs: No Transportation Needs (07/08/2023)  Utilities: Patient Declined (07/09/2023)  Depression (PHQ2-9): Low Risk  (02/23/2023)  Tobacco Use: Medium Risk (07/07/2023)     Readmission Risk Interventions     No data to display

## 2023-07-16 NOTE — Progress Notes (Signed)
Patient seen and evaluated this a.m. with no new concerns or overnight events noted.  He is stable from a respiratory standpoint and does not have any further diarrhea.  Please refer to discharge summary dictated 10/17 for full details.  Awaiting insurance authorization for SNF.  Total care time: 15 minutes.

## 2023-07-19 ENCOUNTER — Non-Acute Institutional Stay (SKILLED_NURSING_FACILITY): Payer: Medicare HMO | Admitting: Adult Health

## 2023-07-19 ENCOUNTER — Encounter: Payer: Self-pay | Admitting: Adult Health

## 2023-07-19 DIAGNOSIS — J441 Chronic obstructive pulmonary disease with (acute) exacerbation: Secondary | ICD-10-CM | POA: Diagnosis not present

## 2023-07-19 DIAGNOSIS — N184 Chronic kidney disease, stage 4 (severe): Secondary | ICD-10-CM

## 2023-07-19 DIAGNOSIS — I110 Hypertensive heart disease with heart failure: Secondary | ICD-10-CM | POA: Diagnosis not present

## 2023-07-19 DIAGNOSIS — N400 Enlarged prostate without lower urinary tract symptoms: Secondary | ICD-10-CM | POA: Diagnosis not present

## 2023-07-19 DIAGNOSIS — C3411 Malignant neoplasm of upper lobe, right bronchus or lung: Secondary | ICD-10-CM

## 2023-07-19 DIAGNOSIS — R600 Localized edema: Secondary | ICD-10-CM

## 2023-07-19 DIAGNOSIS — D638 Anemia in other chronic diseases classified elsewhere: Secondary | ICD-10-CM

## 2023-07-19 DIAGNOSIS — G894 Chronic pain syndrome: Secondary | ICD-10-CM

## 2023-07-19 DIAGNOSIS — I129 Hypertensive chronic kidney disease with stage 1 through stage 4 chronic kidney disease, or unspecified chronic kidney disease: Secondary | ICD-10-CM

## 2023-07-19 DIAGNOSIS — I5033 Acute on chronic diastolic (congestive) heart failure: Secondary | ICD-10-CM

## 2023-07-19 DIAGNOSIS — E43 Unspecified severe protein-calorie malnutrition: Secondary | ICD-10-CM | POA: Diagnosis not present

## 2023-07-19 DIAGNOSIS — E039 Hypothyroidism, unspecified: Secondary | ICD-10-CM

## 2023-07-19 NOTE — Progress Notes (Unsigned)
Location:  [facility] Room Number: 160 Place of Service:  SNF (31)   CODE STATUS: full   No Known Allergies  Chief Complaint  Patient presents with   Hospitalization Follow-up    HPI:  He is a 89 year old man who has been hospitalized from 07-07-23 through 07-15-23. 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 He presented to the ED with lower extremity edema for 2 weeks. He had been more short of breath for the past 6 months; but worse in the last week. He was hospitalized for acuate hypoxemic respiratory failure due to acute COPD exacerbation and acute on chronic diastolic CHF exacerbation.  He is here for short term rehab with his goal to return back home. He will continue to be followed for his chronic illnesses including:  Acquired hypothyroidism:   Benign prostatic hyperplasia without symptoms:    Anemia of chronic disease:    Chronic pain syndrome   Past Medical History:  Diagnosis Date   Arthritis    Hypertension    Hyperthyroidism    lung ca 11/2020    Past Surgical History:  Procedure Laterality Date   COLONOSCOPY N/A 09/02/2016   Procedure: COLONOSCOPY;  Surgeon: [name], MD;  [facility];  Service: Endoscopy;  Laterality: N/A;  830   NO PAST SURGERIES     POLYPECTOMY  09/02/2016   Procedure: POLYPECTOMY;  Surgeon: [name], MD;  Location: [facility];  Service: Endoscopy;;    Social History   Socioeconomic History   Marital status: Divorced    Spouse name: Not on file   Number of children: Not on file   Years of education: Not on file   Highest education level: Not on file  Occupational History   Not on file  Tobacco Use   Smoking status: Former    Current packs/day: 0.00    Average packs/day: 1.5 packs/day for 40.0 years (60.0 ttl pk-yrs)    Types: Cigarettes    Start date: 5    Quit date: 2000    Years since quitting: 24.8    Smokeless tobacco: Never  Substance and Sexual Activity   Alcohol use: No   Drug use: No   Sexual activity: Not on file  Other Topics Concern   Not on file  Social History Narrative   Not on file   Social Determinants of Health   Financial Resource Strain: Not on file  Food Insecurity: No Food Insecurity (07/08/2023)   Hunger Vital Sign    Worried About Running Out of Food in the Last Year: Never true    Ran Out of Food in the Last Year: Never true  Transportation Needs: No Transportation Needs (07/08/2023)   PRAPARE - Administrator, Civil Service (Medical): No    Lack of Transportation (Non-Medical): No  Physical Activity: Not on file  Stress: Not on file  Social Connections: Not on file  Intimate Partner Violence: Not At Risk (07/08/2023)   Humiliation, Afraid, Rape, and Kick questionnaire    Fear of Current or Ex-Partner: No    Emotionally Abused: No    Physically Abused: No    Sexually Abused: No   Family History  Problem Relation Age of Onset   Colon cancer Other       VITAL SIGNS BP (!) 142/62   Pulse 70   Temp 98.6 F (37 C)   Resp 20   Ht  6\' 2"  (1.88 m)   Wt 161 lb 9.6 oz (73.3 kg)   SpO2 98%   BMI 20.75 kg/m   Outpatient Encounter Medications as of 07/19/2023  Medication Sig   labetalol (NORMODYNE) 200 MG tablet Take 200 mg by mouth 2 (two) times daily.   amLODipine (NORVASC) 10 MG tablet Take 10 mg by mouth daily as needed.   diclofenac Sodium (VOLTAREN) 1 % GEL APPLY 2 GRAMS TO THE AFFECTED AREA(S) BY TOPICAL ROUTE 2 TIMES PER DAY   ferrous sulfate 325 (65 FE) MG EC tablet Take 325 mg by mouth daily with breakfast.   fluticasone furoate-vilanterol (BREO ELLIPTA) 100-25 MCG/ACT AEPB Inhale 1 puff into the lungs daily.   furosemide (LASIX) 40 MG tablet Take 1 tablet (40 mg total) by mouth daily.   gabapentin (NEURONTIN) 250 MG/5ML solution Take 3 mLs (150 mg total) by mouth at bedtime.   ibuprofen (ADVIL) 200 MG tablet Take 200 mg by  mouth every 6 (six) hours as needed for mild pain.   Ipratropium-Albuterol (COMBIVENT RESPIMAT) 20-100 MCG/ACT AERS respimat Inhale 1 puff into the lungs every 6 (six) hours as needed.   levothyroxine (SYNTHROID) 150 MCG tablet Take 150 mcg by mouth daily.   loperamide (IMODIUM) 2 MG capsule Take by mouth as needed for diarrhea or loose stools.   tadalafil (CIALIS) 5 MG tablet Take 5 mg by mouth daily.   tamsulosin (FLOMAX) 0.4 MG CAPS capsule Take 0.4 mg by mouth daily.   No facility-administered encounter medications on file as of 07/19/2023.     SIGNIFICANT DIAGNOSTIC EXAMS  TODAY  07-07-23: wbc 5.3; hgb 10.2; hct 32.9; mcv 95.9 plt 167; glucose 98; bun 26; creat 2.04; k+ 3.9; na++ 137; ca 8.0; gfr 31; protein 7.3 albumin 2.7 mag 1.9 07-08-23: tsh 9.279 vitamin B12: 479; folate 7.5; iron 21; tibc 168 07-15-23: wbc 3.9; hgb 8.3; hct 27.1; mcv 98.2 plt 181; glucose 105; bun 39; creat 2.23; k+ 4.3; na++ 137; ca 7.7; gfr 28; mag 1.8   Review of Systems  Constitutional:  Negative for malaise/fatigue.  Respiratory:  Negative for cough and shortness of breath.   Cardiovascular:  Negative for chest pain, palpitations and leg swelling.  Gastrointestinal:  Negative for abdominal pain, constipation and heartburn.  Musculoskeletal:  Negative for back pain, joint pain and myalgias.  Skin: Negative.   Neurological:  Negative for dizziness.  Psychiatric/Behavioral:  The patient is not nervous/anxious.    Physical Exam Constitutional:      General: He is not in acute distress.    Appearance: He is underweight. He is not diaphoretic.  Neck:     Thyroid: No thyromegaly.  Cardiovascular:     Rate and Rhythm: Normal rate and regular rhythm.     Pulses: Normal pulses.     Heart sounds: Normal heart sounds.  Pulmonary:     Effort: Pulmonary effort is normal. No respiratory distress.     Breath sounds: Normal breath sounds.  Abdominal:     General: Bowel sounds are normal. There is no  distension.     Palpations: Abdomen is soft.     Tenderness: There is no abdominal tenderness.  Genitourinary:    Comments: foley Musculoskeletal:        General: Normal range of motion.     Cervical back: Neck supple.     Right lower leg: Edema present.     Left lower leg: Edema present.  Lymphadenopathy:     Cervical: No cervical adenopathy.  Skin:  General: Skin is warm and dry.  Neurological:     Mental Status: He is alert. Mental status is at baseline.  Psychiatric:        Mood and Affect: Mood normal.     ASSESSMENT/ PLAN  TODAY  Acute on chronic diastolic congestive heart failure: peripheral edema: will continue laisx 40 mg daily   2. Benign hypertension with coincident congestive heart failure/benign hypertension with chronic kidney disease stage IV: b/p 142 /62 will continue norvasc 10 mg daily and labetolol 200 mg twice daily   3.  COPD exacerbation: will continue breo 100-25 mcg 1 puff daily; combivent 20/100 mcg every 6 hours as needed  4. Primary cancer right upper lobe: stage IVA  5. Acquired hypothyroidism: tsh 9.279 will continue synthroid 150 mcg daily   6. Benign prostatic hyperplasia without symptoms: will continue flomax 0.4 mg daily and cializ 5 mg daily   7. Anemia of chronic disease: hgb 8.3 iron 21; will continue iron daily   8. Chronic pain syndrome: will continue voltaren gel 2 gm twice daily; gabapentin 150 mg nightly   9. Severe protein calorie malnutrition: albumin 2.7 will begin prosource 30 mL   Synthia Innocent NP [facility] Adult Medicine  call [phone]

## 2023-07-20 ENCOUNTER — Other Ambulatory Visit (HOSPITAL_COMMUNITY)
Admission: RE | Admit: 2023-07-20 | Discharge: 2023-07-20 | Disposition: A | Discharge status: Home / Self Care | Payer: Medicare HMO | Source: Skilled Nursing Facility | Attending: Adult Health | Admitting: Adult Health

## 2023-07-20 ENCOUNTER — Encounter: Payer: Self-pay | Admitting: Adult Health

## 2023-07-20 ENCOUNTER — Non-Acute Institutional Stay (SKILLED_NURSING_FACILITY): Payer: Medicare HMO | Admitting: Adult Health

## 2023-07-20 DIAGNOSIS — I5033 Acute on chronic diastolic (congestive) heart failure: Secondary | ICD-10-CM | POA: Insufficient documentation

## 2023-07-20 DIAGNOSIS — E43 Unspecified severe protein-calorie malnutrition: Secondary | ICD-10-CM | POA: Diagnosis not present

## 2023-07-20 DIAGNOSIS — D638 Anemia in other chronic diseases classified elsewhere: Secondary | ICD-10-CM | POA: Diagnosis not present

## 2023-07-20 LAB — COMPREHENSIVE METABOLIC PANEL
ALT: 22 U/L (ref 0–44)
AST: 11 U/L — ABNORMAL LOW (ref 15–41)
Albumin: 2.1 g/dL — ABNORMAL LOW (ref 3.5–5.0)
Alkaline Phosphatase: 56 U/L (ref 38–126)
Anion gap: 4 — ABNORMAL LOW (ref 5–15)
BUN: 45 mg/dL — ABNORMAL HIGH (ref 8–23)
CO2: 28 mmol/L (ref 22–32)
Calcium: 7.5 mg/dL — ABNORMAL LOW (ref 8.9–10.3)
Chloride: 106 mmol/L (ref 98–111)
Creatinine, Ser: 2.08 mg/dL — ABNORMAL HIGH (ref 0.61–1.24)
GFR, Estimated: 30 mL/min — ABNORMAL LOW (ref >60)
Glucose, Bld: 93 mg/dL (ref 70–99)
Potassium: 4.3 mmol/L (ref 3.5–5.1)
Sodium: 138 mmol/L (ref 135–145)
Total Bilirubin: 0.3 mg/dL (ref 0.3–1.2)
Total Protein: 5.5 g/dL — ABNORMAL LOW (ref 6.5–8.1)

## 2023-07-20 LAB — TSH: TSH: 6.07 u[IU]/mL — ABNORMAL HIGH (ref 0.350–4.500)

## 2023-07-20 LAB — CBC WITH DIFFERENTIAL/PLATELET
Abs Immature Granulocytes: 0.07 10*3/uL (ref 0.00–0.07)
Basophils Absolute: 0 10*3/uL (ref 0.0–0.1)
Basophils Relative: 0 %
Eosinophils Absolute: 0.1 10*3/uL (ref 0.0–0.5)
Eosinophils Relative: 3 %
HCT: 26.4 % — ABNORMAL LOW (ref 39.0–52.0)
Hemoglobin: 8.1 g/dL — ABNORMAL LOW (ref 13.0–17.0)
Immature Granulocytes: 2 %
Lymphocytes Relative: 22 %
Lymphs Abs: 1 10*3/uL (ref 0.7–4.0)
MCH: 30.8 pg (ref 26.0–34.0)
MCHC: 30.7 g/dL (ref 30.0–36.0)
MCV: 100.4 fL — ABNORMAL HIGH (ref 80.0–100.0)
Monocytes Absolute: 0.4 10*3/uL (ref 0.1–1.0)
Monocytes Relative: 9 %
Neutro Abs: 2.9 10*3/uL (ref 1.7–7.7)
Neutrophils Relative %: 64 %
Platelets: 175 10*3/uL (ref 150–400)
RBC: 2.63 MIL/uL — ABNORMAL LOW (ref 4.22–5.81)
RDW: 17.7 % — ABNORMAL HIGH (ref 11.5–15.5)
WBC: 4.5 10*3/uL (ref 4.0–10.5)
nRBC: 0 % (ref 0.0–0.2)

## 2023-07-20 NOTE — Progress Notes (Signed)
Location:  [facility] Room Number: 160 Place of Service:  SNF (31)   CODE STATUS: full   No Known Allergies  Chief Complaint  Patient presents with   Acute Visit    Follow up lab work     HPI:  He has anemia of chronic disease. His albumin is low at 2.1 his calcium is low at 7.5. These will need supplementation. He continues with therapy to improve upon his independence with his adls.   Past Medical History:  Diagnosis Date   Arthritis    Hypertension    Hyperthyroidism    lung ca 11/2020    Past Surgical History:  Procedure Laterality Date   COLONOSCOPY N/A 09/02/2016   Procedure: COLONOSCOPY;  Surgeon: [name], MD;  [facility];  Service: Endoscopy;  Laterality: N/A;  830   NO PAST SURGERIES     POLYPECTOMY  09/02/2016   Procedure: POLYPECTOMY;  Surgeon: [name], MD;  [facility];  Service: Endoscopy;;    Social History   Socioeconomic History   Marital status: Divorced    Spouse name: Not on file   Number of children: Not on file   Years of education: Not on file   Highest education level: Not on file  Occupational History   Not on file  Tobacco Use   Smoking status: Former    Current packs/day: 0.00    Average packs/day: 1.5 packs/day for 40.0 years (60.0 ttl pk-yrs)    Types: Cigarettes    Start date: 42    Quit date: 2000    Years since quitting: 24.8   Smokeless tobacco: Never  Substance and Sexual Activity   Alcohol use: No   Drug use: No   Sexual activity: Not on file  Other Topics Concern   Not on file  Social History Narrative   Not on file   Social Determinants of Health   Financial Resource Strain: Not on file  Food Insecurity: No Food Insecurity (07/08/2023)   Hunger Vital Sign    Worried About Running Out of Food in the Last Year: Never true    Ran Out of Food in the Last Year: Never true  Transportation Needs: No Transportation Needs (07/08/2023)   PRAPARE -  Administrator, Civil Service (Medical): No    Lack of Transportation (Non-Medical): No  Physical Activity: Not on file  Stress: Not on file  Social Connections: Not on file  Intimate Partner Violence: Not At Risk (07/08/2023)   Humiliation, Afraid, Rape, and Kick questionnaire    Fear of Current or Ex-Partner: No    Emotionally Abused: No    Physically Abused: No    Sexually Abused: No   Family History  Problem Relation Age of Onset   Colon cancer Other       VITAL SIGNS BP 121/72   Pulse 81   Temp (!) 97.4 F (36.3 C)   Resp 20   Ht 6\' 2"  (1.88 m)   Wt 155 lb (70.3 kg)   SpO2 99%   BMI 19.90 kg/m   Outpatient Encounter Medications as of 07/20/2023  Medication Sig Note   amLODipine (NORVASC) 10 MG tablet Take 10 mg by mouth daily as needed.    diclofenac Sodium (VOLTAREN) 1 % GEL APPLY 2 GRAMS TO THE AFFECTED AREA(S) BY TOPICAL ROUTE 2 TIMES PER DAY    ferrous sulfate 325 (65 FE) MG EC tablet Take 325 mg by  mouth daily with breakfast.    fluticasone furoate-vilanterol (BREO ELLIPTA) 100-25 MCG/ACT AEPB Inhale 1 puff into the lungs daily.    furosemide (LASIX) 40 MG tablet Take 1 tablet (40 mg total) by mouth daily.    gabapentin (NEURONTIN) 250 MG/5ML solution Take 3 mLs (150 mg total) by mouth at bedtime. (Patient taking differently: Take 150 mg by mouth daily as needed.)    ibuprofen (ADVIL) 200 MG tablet Take 200 mg by mouth every 6 (six) hours as needed for mild pain.    Ipratropium-Albuterol (COMBIVENT RESPIMAT) 20-100 MCG/ACT AERS respimat Inhale 1 puff into the lungs every 6 (six) hours as needed.    labetalol (NORMODYNE) 200 MG tablet Take 200 mg by mouth 2 (two) times daily. (Patient not taking: Reported on 07/07/2023) 07/07/2023: Pt's family stated pt doesn't take this but was recently filled last month for a 30 days supply.   levothyroxine (SYNTHROID) 150 MCG tablet Take 150 mcg by mouth daily.    loperamide (IMODIUM) 2 MG capsule Take by mouth as  needed for diarrhea or loose stools.    tadalafil (CIALIS) 5 MG tablet Take 5 mg by mouth daily.    tamsulosin (FLOMAX) 0.4 MG CAPS capsule Take 0.4 mg by mouth daily.    No facility-administered encounter medications on file as of 07/20/2023.     SIGNIFICANT DIAGNOSTIC EXAMS  PREVIOUS   07-07-23: wbc 5.3; hgb 10.2; hct 32.9; mcv 95.9 plt 167; glucose 98; bun 26; creat 2.04; k+ 3.9; na++ 137; ca 8.0; gfr 31; protein 7.3 albumin 2.7 mag 1.9 07-08-23: tsh 9.279 vitamin B12: 479; folate 7.5; iron 21; tibc 168 07-15-23: wbc 3.9; hgb 8.3; hct 27.1; mcv 98.2 plt 181; glucose 105; bun 39; creat 2.23; k+ 4.3; na++ 137; ca 7.7; gfr 28; mag 1.8   TODAY  07-20-23: wbc 4.5; hgb 8.1; hct 26.4; mcv 100.4 plt 175; glucose 93; bun 45; creat 2.08; k+ 4.3; na++ 138; ca 7.5; gfr 30 protein 5.5 albumin 2.1; tsh 6.070  Review of Systems  Constitutional:  Negative for malaise/fatigue.  Respiratory:  Negative for cough and shortness of breath.   Cardiovascular:  Negative for chest pain, palpitations and leg swelling.  Gastrointestinal:  Negative for abdominal pain, constipation and heartburn.  Musculoskeletal:  Negative for back pain, joint pain and myalgias.  Skin: Negative.   Neurological:  Negative for dizziness.  Psychiatric/Behavioral:  The patient is not nervous/anxious.     Physical Exam Constitutional:      General: He is not in acute distress.    Appearance: He is underweight. He is not diaphoretic.  Neck:     Thyroid: No thyromegaly.  Cardiovascular:     Rate and Rhythm: Normal rate and regular rhythm.     Pulses: Normal pulses.     Heart sounds: Normal heart sounds.  Pulmonary:     Effort: Pulmonary effort is normal. No respiratory distress.     Breath sounds: Normal breath sounds.  Abdominal:     General: Bowel sounds are normal. There is no distension.     Palpations: Abdomen is soft.     Tenderness: There is no abdominal tenderness.  Genitourinary:    Comments:  foley Musculoskeletal:        General: Normal range of motion.     Cervical back: Neck supple.     Right lower leg: Edema present.     Left lower leg: Edema present.  Lymphadenopathy:     Cervical: No cervical adenopathy.  Skin:  General: Skin is warm and dry.  Neurological:     Mental Status: He is alert. Mental status is at baseline.  Psychiatric:        Mood and Affect: Mood normal.      ASSESSMENT/ PLAN:  TODAY  Anemia of chronic disease Severe protein calorie malnutrition Hypocalcemia  Will begin calcium 500 mg daily  Will begin prosource 30 mL three times daily  Iron three days per week Will repeat cbc; cmp    Synthia Innocent NP [facility] Adult Medicine  call [phone]

## 2023-07-21 ENCOUNTER — Telehealth: Payer: Self-pay | Admitting: Medical Oncology

## 2023-07-21 NOTE — Telephone Encounter (Signed)
Can pt transfer his care to [facility]? [name] will talk to family about this and let us  know what pt wants to do. He resides now at [facility] in [place].

## 2023-07-22 DIAGNOSIS — I129 Hypertensive chronic kidney disease with stage 1 through stage 4 chronic kidney disease, or unspecified chronic kidney disease: Secondary | ICD-10-CM | POA: Insufficient documentation

## 2023-07-22 DIAGNOSIS — E43 Unspecified severe protein-calorie malnutrition: Secondary | ICD-10-CM | POA: Insufficient documentation

## 2023-07-22 DIAGNOSIS — I110 Hypertensive heart disease with heart failure: Secondary | ICD-10-CM | POA: Insufficient documentation

## 2023-07-26 ENCOUNTER — Other Ambulatory Visit (HOSPITAL_COMMUNITY)
Admission: RE | Admit: 2023-07-26 | Discharge: 2023-07-26 | Disposition: A | Discharge status: Home / Self Care | Payer: Medicare HMO | Source: Skilled Nursing Facility | Attending: Adult Health | Admitting: Adult Health

## 2023-07-26 DIAGNOSIS — C3412 Malignant neoplasm of upper lobe, left bronchus or lung: Secondary | ICD-10-CM | POA: Insufficient documentation

## 2023-07-26 LAB — CBC WITH DIFFERENTIAL/PLATELET
Abs Immature Granulocytes: 0.02 10*3/uL (ref 0.00–0.07)
Basophils Absolute: 0 10*3/uL (ref 0.0–0.1)
Basophils Relative: 1 %
Eosinophils Absolute: 0.1 10*3/uL (ref 0.0–0.5)
Eosinophils Relative: 3 %
HCT: 26.7 % — ABNORMAL LOW (ref 39.0–52.0)
Hemoglobin: 8.3 g/dL — ABNORMAL LOW (ref 13.0–17.0)
Immature Granulocytes: 1 %
Lymphocytes Relative: 29 %
Lymphs Abs: 1 10*3/uL (ref 0.7–4.0)
MCH: 31 pg (ref 26.0–34.0)
MCHC: 31.1 g/dL (ref 30.0–36.0)
MCV: 99.6 fL (ref 80.0–100.0)
Monocytes Absolute: 0.3 10*3/uL (ref 0.1–1.0)
Monocytes Relative: 7 %
Neutro Abs: 2.1 10*3/uL (ref 1.7–7.7)
Neutrophils Relative %: 59 %
Platelets: 178 10*3/uL (ref 150–400)
RBC: 2.68 MIL/uL — ABNORMAL LOW (ref 4.22–5.81)
RDW: 16.8 % — ABNORMAL HIGH (ref 11.5–15.5)
WBC: 3.6 10*3/uL — ABNORMAL LOW (ref 4.0–10.5)
nRBC: 0 % (ref 0.0–0.2)

## 2023-07-26 LAB — COMPREHENSIVE METABOLIC PANEL
ALT: 21 U/L (ref 0–44)
AST: 16 U/L (ref 15–41)
Albumin: 2.2 g/dL — ABNORMAL LOW (ref 3.5–5.0)
Alkaline Phosphatase: 58 U/L (ref 38–126)
Anion gap: 6 (ref 5–15)
BUN: 50 mg/dL — ABNORMAL HIGH (ref 8–23)
CO2: 27 mmol/L (ref 22–32)
Calcium: 7.8 mg/dL — ABNORMAL LOW (ref 8.9–10.3)
Chloride: 106 mmol/L (ref 98–111)
Creatinine, Ser: 2.18 mg/dL — ABNORMAL HIGH (ref 0.61–1.24)
GFR, Estimated: 29 mL/min — ABNORMAL LOW (ref >60)
Glucose, Bld: 86 mg/dL (ref 70–99)
Potassium: 4.8 mmol/L (ref 3.5–5.1)
Sodium: 139 mmol/L (ref 135–145)
Total Bilirubin: 0.4 mg/dL (ref 0.3–1.2)
Total Protein: 6 g/dL — ABNORMAL LOW (ref 6.5–8.1)

## 2023-07-26 LAB — IRON AND TIBC
Iron: 33 ug/dL — ABNORMAL LOW (ref 45–182)
Saturation Ratios: 16 % — ABNORMAL LOW (ref 17.9–39.5)
TIBC: 201 ug/dL — ABNORMAL LOW (ref 250–450)
UIBC: 168 ug/dL

## 2023-07-27 ENCOUNTER — Encounter: Payer: Self-pay | Admitting: Internal Medicine

## 2023-07-27 ENCOUNTER — Non-Acute Institutional Stay (SKILLED_NURSING_FACILITY): Payer: Medicare HMO | Admitting: Internal Medicine

## 2023-07-27 DIAGNOSIS — E43 Unspecified severe protein-calorie malnutrition: Secondary | ICD-10-CM

## 2023-07-27 DIAGNOSIS — D638 Anemia in other chronic diseases classified elsewhere: Secondary | ICD-10-CM | POA: Diagnosis not present

## 2023-07-27 DIAGNOSIS — E039 Hypothyroidism, unspecified: Secondary | ICD-10-CM | POA: Diagnosis not present

## 2023-07-27 DIAGNOSIS — I5033 Acute on chronic diastolic (congestive) heart failure: Secondary | ICD-10-CM | POA: Diagnosis not present

## 2023-07-27 DIAGNOSIS — N184 Chronic kidney disease, stage 4 (severe): Secondary | ICD-10-CM

## 2023-07-27 DIAGNOSIS — J9601 Acute respiratory failure with hypoxia: Secondary | ICD-10-CM

## 2023-07-27 DIAGNOSIS — I10 Essential (primary) hypertension: Secondary | ICD-10-CM | POA: Diagnosis not present

## 2023-07-27 NOTE — Assessment & Plan Note (Signed)
Most recent total protein is ranged from a low of 5.5 up to high of 6.1 and albumin from 2.1-2.7.  Nutrition consult at SNF.

## 2023-07-27 NOTE — Assessment & Plan Note (Signed)
Current TSH is slightly elevated at 6.07 on L-thyroxine 150 mcg daily.  TSH should be rechecked in 6-8 weeks to rule out progressive hypothyroidism despite relatively high L-thyroxine supplementation dose.

## 2023-07-27 NOTE — Progress Notes (Unsigned)
NURSING HOME LOCATION:  [facility] ROOM NUMBER: [id number]  CODE STATUS:  Full Code  PCP:  Kathleene Hazel. [name] MD  This is a comprehensive admission note to this SNFperformed on this date less than 30 days from date of admission. Included are preadmission medical/surgical history; reconciled medication list; family history; social history and comprehensive review of systems.  Corrections and additions to the records were documented. Comprehensive physical exam was also performed. Additionally a clinical summary was entered for each active diagnosis pertinent to this admission in the Problem List to enhance continuity of care.  HPI: He was hospitalized 10/9 - 07/16/2023 presenting with peripheral edema for 2 weeks PTA.  This was associated with increased dyspnea over the prior 6 months PTA with exacerbation the week PTA. He was admitted with acute hypoxic respiratory failure secondary to acute COPD exacerbation as well as acute on chronic diastolic congestive heart failure exacerbation.  Steroids and oral Lasix were initiated.  He required 2 L of nasal oxygen for adequate saturation. Course was complicated by mild, self-limiting diarrhea which resolved prior to discharge.  Past medical and surgical history: Includes hypertension, hypocalcemia, iron deficiency anemia, peripheral neuropathy, COPD, CKD stage IIIb, hypothyroidism, history of lung cancer, and symptomatic BPH.  He also has a history of vitiligo due to immunotherapy.  Family history: reviewed, non contributory due to advanced age.  Social history: Nondrinker; former smoker with a history of 60 pack years consumption.   Review of systems: Clinical neurocognitive deficits made validity of responses questionable , compromising ROS completion. Date given as Constitutional: No fever, significant weight change, fatigue  Eyes: No redness, discharge, pain, vision change ENT/mouth: No nasal congestion, purulent discharge, earache,  change in hearing, sore throat  Cardiovascular: No chest pain, palpitations, paroxysmal nocturnal dyspnea, claudication, edema  Respiratory: No cough, sputum production, hemoptysis, DOE, significant snoring, apnea Gastrointestinal: No heartburn, dysphagia, abdominal pain, nausea /vomiting, rectal bleeding, melena, change in bowels Genitourinary: No dysuria, hematuria, pyuria, incontinence, nocturia Musculoskeletal: No joint stiffness, joint swelling, weakness, pain Dermatologic: No rash, pruritus, change in appearance of skin Neurologic: No dizziness, headache, syncope, seizures, numbness, tingling Psychiatric: No significant anxiety, depression, insomnia, anorexia Endocrine: No change in hair/skin/nails, excessive thirst, excessive hunger, excessive urination  Hematologic/lymphatic: No significant bruising, lymphadenopathy, abnormal bleeding Allergy/immunology: No itchy/watery eyes, significant sneezing, urticaria, angioedema  Physical exam:  Pertinent or positive findings: General appearance: Adequately nourished; no acute distress, increased work of breathing is present.   Lymphatic: No lymphadenopathy about the head, neck, axilla. Eyes: No conjunctival inflammation or lid edema is present. There is no scleral icterus. Ears:  External ear exam shows no significant lesions or deformities.   Nose:  External nasal examination shows no deformity or inflammation. Nasal mucosa are pink and moist without lesions, exudates Oral exam: Lips and gums are healthy appearing.There is no oropharyngeal erythema or exudate. Neck:  No thyromegaly, masses, tenderness noted.    Heart:  Normal rate and regular rhythm. S1 and S2 normal without gallop, murmur, click, rub.  Lungs: Chest clear to auscultation without wheezes, rhonchi, rales, rubs. Abdomen: Bowel sounds are normal.  Abdomen is soft and nontender with no organomegaly, hernias, masses. GU: Deferred  Extremities:  No cyanosis, clubbing,  edema. Neurologic exam:  Strength equal  in upper & lower extremities. Balance, Rhomberg, finger to nose testing could not be completed due to clinical state Deep tendon reflexes are equal Skin: Warm & dry w/o tenting. No significant lesions or rash.  See clinical summary under  each active problem in the Problem List with associated updated therapeutic plan

## 2023-07-27 NOTE — Patient Instructions (Signed)
See assessment and plan under each diagnosis in the problem list and acutely for this visit 

## 2023-07-27 NOTE — Assessment & Plan Note (Signed)
O2 sats adequate on present regimen.  Will change and continue to monitor.

## 2023-07-27 NOTE — Assessment & Plan Note (Signed)
As noted iron levels were low but indices were normochromic, normocytic except for an isolated MCV of 100.  Finally MCV was 99.6.  Nadir H/H was 8.1/26.4 with final values of 8.3/26.7.  FOBT positive X 2 @ SNF. GI consult requested. D/C NSAID.

## 2023-07-27 NOTE — Assessment & Plan Note (Signed)
07/09/2023 echo also revealed a small pericardial effusion without clinical evidence of compromise. Monitor weight at SNF.

## 2023-07-27 NOTE — Assessment & Plan Note (Signed)
While hospitalized with acute hypoxic respiratory failure 10/9 - 07/16/2023 creatinine ranged from a peak of 2.31 to a low of 1.91.  Final creatinine was 2.18.  Nadir GFR was 27 with a range of 27-34.  Final GFR was 29 indicating high stage IV CKD.  Med list reviewed; no change from in medications or dosage at this time but continue to monitor.

## 2023-07-29 NOTE — Assessment & Plan Note (Signed)
Slight elevation of systolic BP X 2. Verify average & adjust BP regimen as appropriate in milieu of CKD Stage 4.

## 2023-07-30 ENCOUNTER — Encounter: Payer: Self-pay | Admitting: Adult Health

## 2023-07-30 ENCOUNTER — Non-Acute Institutional Stay (SKILLED_NURSING_FACILITY): Payer: Medicare HMO | Admitting: Adult Health

## 2023-07-30 DIAGNOSIS — C3411 Malignant neoplasm of upper lobe, right bronchus or lung: Secondary | ICD-10-CM

## 2023-07-30 DIAGNOSIS — I700 Atherosclerosis of aorta: Secondary | ICD-10-CM | POA: Insufficient documentation

## 2023-07-30 DIAGNOSIS — I129 Hypertensive chronic kidney disease with stage 1 through stage 4 chronic kidney disease, or unspecified chronic kidney disease: Secondary | ICD-10-CM

## 2023-07-30 DIAGNOSIS — N184 Chronic kidney disease, stage 4 (severe): Secondary | ICD-10-CM

## 2023-07-30 NOTE — Progress Notes (Signed)
Location:  [facility] Room Number: 160 Place of Service:  SNF (31)   CODE STATUS: Full Code  No Known Allergies  Chief Complaint  Patient presents with   Acute Visit    care plan meeting    HPI:    Past Medical History:  Diagnosis Date   Arthritis    Hypertension    Hyperthyroidism    lung ca 11/2020    Past Surgical History:  Procedure Laterality Date   COLONOSCOPY N/A 09/02/2016   Procedure: COLONOSCOPY;  Surgeon: [name], MD;  Location: [facility];  Service: Endoscopy;  Laterality: N/A;  830   NO PAST SURGERIES     POLYPECTOMY  09/02/2016   Procedure: POLYPECTOMY;  Surgeon: [name], MD;  Location: [facility];  Service: Endoscopy;;    Social History   Socioeconomic History   Marital status: Divorced    Spouse name: Not on file   Number of children: Not on file   Years of education: Not on file   Highest education level: Not on file  Occupational History   Not on file  Tobacco Use   Smoking status: Former    Current packs/day: 0.00    Average packs/day: 1.5 packs/day for 40.0 years (60.0 ttl pk-yrs)    Types: Cigarettes    Start date: 12    Quit date: 2000    Years since quitting: 24.8   Smokeless tobacco: Never  Substance and Sexual Activity   Alcohol use: No   Drug use: No   Sexual activity: Not on file  Other Topics Concern   Not on file  Social History Narrative   Not on file   Social Determinants of Health   Financial Resource Strain: Not on file  Food Insecurity: No Food Insecurity (07/08/2023)   Hunger Vital Sign    Worried About Running Out of Food in the Last Year: Never true    Ran Out of Food in the Last Year: Never true  Transportation Needs: No Transportation Needs (07/08/2023)   PRAPARE - Administrator, Civil Service (Medical): No    Lack of Transportation (Non-Medical): No  Physical Activity: Not on file  Stress: Not on file  Social Connections: Not on file  Intimate  Partner Violence: Not At Risk (07/08/2023)   Humiliation, Afraid, Rape, and Kick questionnaire    Fear of Current or Ex-Partner: No    Emotionally Abused: No    Physically Abused: No    Sexually Abused: No   Family History  Problem Relation Age of Onset   Colon cancer Other       VITAL SIGNS BP (!) 120/57   Pulse 76   Temp 98.9 F (37.2 C)   Resp 20   Ht 6\' 2"  (1.88 m)   Wt 149 lb 3.2 oz (67.7 kg)   SpO2 97%   BMI 19.16 kg/m   Outpatient Encounter Medications as of 07/30/2023  Medication Sig   amLODipine (NORVASC) 10 MG tablet Take 10 mg by mouth daily as needed.   calcium carbonate (OSCAL) 1500 (600 Ca) MG TABS tablet Take 500 mg by mouth daily with breakfast. For calcium supplement   ferrous sulfate 325 (65 FE) MG EC tablet Take 325 mg by mouth 3 (three) times a week.   fluticasone furoate-vilanterol (BREO ELLIPTA) 100-25 MCG/ACT AEPB Inhale 1 puff into the lungs daily.   furosemide (LASIX) 40 MG tablet Take 1 tablet (40 mg total) by mouth daily.  gabapentin (NEURONTIN) 250 MG/5ML solution Take 3 mLs (150 mg total) by mouth at bedtime.   ibuprofen (ADVIL) 200 MG tablet Take 200 mg by mouth every 6 (six) hours as needed for mild pain.   Ipratropium-Albuterol (COMBIVENT RESPIMAT) 20-100 MCG/ACT AERS respimat Inhale 1 puff into the lungs every 6 (six) hours as needed.   labetalol (NORMODYNE) 200 MG tablet Take 200 mg by mouth 2 (two) times daily.   levothyroxine (SYNTHROID) 150 MCG tablet Take 150 mcg by mouth daily.   Protein (PROSOURCE PO) Take 30 mLs by mouth 3 (three) times daily.   tadalafil (CIALIS) 5 MG tablet Take 5 mg by mouth daily.   tamsulosin (FLOMAX) 0.4 MG CAPS capsule Take 0.4 mg by mouth daily.   diclofenac Sodium (VOLTAREN) 1 % GEL APPLY 2 GRAMS TO THE AFFECTED AREA(S) BY TOPICAL ROUTE 2 TIMES PER DAY (Patient not taking: Reported on 07/30/2023)   hydrochlorothiazide (MICROZIDE) 12.5 MG capsule Take 12.5 mg by mouth daily. (Patient not taking: Reported on  07/30/2023)   loperamide (IMODIUM) 2 MG capsule Take by mouth as needed for diarrhea or loose stools. (Patient not taking: Reported on 07/30/2023)   No facility-administered encounter medications on file as of 07/30/2023.     SIGNIFICANT DIAGNOSTIC EXAMS       ASSESSMENT/ PLAN:     Synthia Innocent NP [facility]  Contact [phone] Monday through Friday 8am- 5pm  After hours call [phone]

## 2023-07-30 NOTE — Progress Notes (Signed)
Location:  [facility] Room Number: 160 Place of Service:  SNF (31)   CODE STATUS: full   No Known Allergies  Chief Complaint  Patient presents with   Acute Visit    care plan meeting    HPI:  We have come together for his care plan meeting. Family present BIMS 9/15 mood 5/30: decreased energy, nervous at times. He is nonambulatory without falls. He requires independent to mod assist with his adl care. He is occasionally incontinent of bowel; has foley. Dietary: regular diet: 51-100%; he requires setup for meals; weight is 158.6 pounds. Therapy: supervision for upper body; min/mod assist for lower body; transfers min assist; ambulate 10 feet with walker at min assist brp min/mod assist no steps at this time.  BCAT moderate impairment. Activities: family visits, tv, newspaper, religious services. He has tested positive hemoccult X3; awaiting GI consult. The oxygen is new to him. 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    Primary cancer of right upper lobe of lung  Past Medical History:  Diagnosis Date   Arthritis    Hypertension    Hyperthyroidism    lung ca 11/2020    Past Surgical History:  Procedure Laterality Date   COLONOSCOPY N/A 09/02/2016   Procedure: COLONOSCOPY;  Surgeon: [name], MD;  [facility]: [facility];  Service: Endoscopy;  Laterality: N/A;  830   NO PAST SURGERIES     POLYPECTOMY  09/02/2016   Procedure: POLYPECTOMY;  Surgeon: [name], MD;  Location: [facility];  Service: Endoscopy;;    Social History   Socioeconomic History   Marital status: Divorced    Spouse name: Not on file   Number of children: Not on file   Years of education: Not on file   Highest education level: Not on file  Occupational History   Not on file  Tobacco Use   Smoking status: Former    Current packs/day: 0.00     Average packs/day: 1.5 packs/day for 40.0 years (60.0 ttl pk-yrs)    Types: Cigarettes    Start date: 89    Quit date: 2000    Years since quitting: 24.8   Smokeless tobacco: Never  Substance and Sexual Activity   Alcohol use: No   Drug use: No   Sexual activity: Not on file  Other Topics Concern   Not on file  Social History Narrative   Not on file   Social Determinants of Health   Financial Resource Strain: Not on file  Food Insecurity: No Food Insecurity (07/08/2023)   Hunger Vital Sign    Worried About Running Out of Food in the Last Year: Never true    Ran Out of Food in the Last Year: Never true  Transportation Needs: No Transportation Needs (07/08/2023)   PRAPARE - Administrator, Civil Service (Medical): No    Lack of Transportation (Non-Medical): No  Physical Activity: Not on file  Stress: Not on file  Social Connections: Not on file  Intimate Partner Violence: Not At Risk (07/08/2023)   Humiliation, Afraid, Rape, and Kick questionnaire    Fear of Current or Ex-Partner: No    Emotionally Abused: No    Physically Abused: No    Sexually Abused: No   Family History  Problem Relation Age of Onset   Colon cancer Other  VITAL SIGNS BP (!) 120/57   Pulse 76   Temp 98.9 F (37.2 C)   Resp 20   Ht 6\' 2"  (1.88 m)   Wt 149 lb 3.2 oz (67.7 kg)   SpO2 97%   BMI 19.16 kg/m   Outpatient Encounter Medications as of 07/30/2023  Medication Sig   amLODipine (NORVASC) 10 MG tablet Take 10 mg by mouth daily as needed.   calcium carbonate (OSCAL) 1500 (600 Ca) MG TABS tablet Take 500 mg by mouth daily with breakfast. For calcium supplement   diclofenac Sodium (VOLTAREN) 1 % GEL APPLY 2 GRAMS TO THE AFFECTED AREA(S) BY TOPICAL ROUTE 2 TIMES PER DAY   ferrous sulfate 325 (65 FE) MG EC tablet Take 325 mg by mouth 3 (three) times a week.   fluticasone furoate-vilanterol (BREO ELLIPTA) 100-25 MCG/ACT AEPB Inhale 1 puff into the lungs daily.   furosemide  (LASIX) 40 MG tablet Take 1 tablet (40 mg total) by mouth daily.   gabapentin (NEURONTIN) 250 MG/5ML solution Take 3 mLs (150 mg total) by mouth at bedtime.   ibuprofen (ADVIL) 200 MG tablet Take 200 mg by mouth every 6 (six) hours as needed for mild pain.   Ipratropium-Albuterol (COMBIVENT RESPIMAT) 20-100 MCG/ACT AERS respimat Inhale 1 puff into the lungs every 6 (six) hours as needed.   labetalol (NORMODYNE) 200 MG tablet Take 200 mg by mouth 2 (two) times daily.   levothyroxine (SYNTHROID) 150 MCG tablet Take 150 mcg by mouth daily.   loperamide (IMODIUM) 2 MG capsule Take by mouth as needed for diarrhea or loose stools.   Protein (PROSOURCE PO) Take 30 mLs by mouth 3 (three) times daily.   tadalafil (CIALIS) 5 MG tablet Take 5 mg by mouth daily.   tamsulosin (FLOMAX) 0.4 MG CAPS capsule Take 0.4 mg by mouth daily.   No facility-administered encounter medications on file as of 07/30/2023.     SIGNIFICANT DIAGNOSTIC EXAMS  PREVIOUS   07-07-23: wbc 5.3; hgb 10.2; hct 32.9; mcv 95.9 plt 167; glucose 98; bun 26; creat 2.04; k+ 3.9; na++ 137; ca 8.0; gfr 31; protein 7.3 albumin 2.7 mag 1.9 07-08-23: tsh 9.279 vitamin B12: 479; folate 7.5; iron 21; tibc 168 07-15-23: wbc 3.9; hgb 8.3; hct 27.1; mcv 98.2 plt 181; glucose 105; bun 39; creat 2.23; k+ 4.3; na++ 137; ca 7.7; gfr 28; mag 1.8  07-20-23: wbc 4.5; hgb 8.1; hct 26.4; mcv 100.4 plt 175; glucose 93; bun 45; creat 2.08; k+ 4.3; na++ 138; ca 7.5; gfr 30 protein 5.5 albumin 2.1; tsh 6.070  NO NEW LABS   Review of Systems  Constitutional:  Negative for malaise/fatigue.  Respiratory:  Negative for cough and shortness of breath.   Cardiovascular:  Negative for chest pain, palpitations and leg swelling.  Gastrointestinal:  Negative for abdominal pain, constipation and heartburn.  Musculoskeletal:  Negative for back pain, joint pain and myalgias.  Skin: Negative.   Neurological:  Negative for dizziness.  Psychiatric/Behavioral:  The  patient is not nervous/anxious.     Physical Exam Constitutional:      General: He is not in acute distress.    Appearance: He is underweight. He is not diaphoretic.  Neck:     Thyroid: No thyromegaly.  Cardiovascular:     Rate and Rhythm: Normal rate and regular rhythm.     Heart sounds: Normal heart sounds.  Pulmonary:     Effort: Pulmonary effort is normal. No respiratory distress.     Breath sounds: Normal breath sounds.  Abdominal:     General: Bowel sounds are normal. There is no distension.     Palpations: Abdomen is soft.     Tenderness: There is no abdominal tenderness.  Genitourinary:    Comments: foley Musculoskeletal:        General: Normal range of motion.     Cervical back: Neck supple.     Right lower leg: Edema present.     Left lower leg: Edema present.  Lymphadenopathy:     Cervical: No cervical adenopathy.  Skin:    General: Skin is warm and dry.  Neurological:     Mental Status: He is alert. Mental status is at baseline.  Psychiatric:        Mood and Affect: Mood normal.     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

## 2023-07-30 NOTE — Progress Notes (Signed)
This encounter was created in error - please disregard.

## 2023-08-02 ENCOUNTER — Other Ambulatory Visit (HOSPITAL_COMMUNITY)
Admission: RE | Admit: 2023-08-02 | Discharge: 2023-08-02 | Disposition: A | Discharge status: Home / Self Care | Payer: Medicare HMO | Source: Skilled Nursing Facility | Attending: Adult Health | Admitting: Adult Health

## 2023-08-02 DIAGNOSIS — D649 Anemia, unspecified: Secondary | ICD-10-CM | POA: Insufficient documentation

## 2023-08-02 LAB — HEMOGLOBIN AND HEMATOCRIT, BLOOD
HCT: 28.2 % — ABNORMAL LOW (ref 39.0–52.0)
Hemoglobin: 8.3 g/dL — ABNORMAL LOW (ref 13.0–17.0)

## 2023-08-03 ENCOUNTER — Non-Acute Institutional Stay (SKILLED_NURSING_FACILITY): Payer: Medicare HMO | Admitting: Adult Health

## 2023-08-03 DIAGNOSIS — G894 Chronic pain syndrome: Secondary | ICD-10-CM

## 2023-08-03 DIAGNOSIS — F4321 Adjustment disorder with depressed mood: Secondary | ICD-10-CM | POA: Diagnosis not present

## 2023-08-03 DIAGNOSIS — R634 Abnormal weight loss: Secondary | ICD-10-CM | POA: Diagnosis not present

## 2023-08-04 ENCOUNTER — Encounter: Payer: Self-pay | Admitting: Adult Health

## 2023-08-04 ENCOUNTER — Telehealth: Payer: Self-pay | Admitting: *Deleted

## 2023-08-04 NOTE — Progress Notes (Signed)
Location:  [facility] Room Number: 160 Place of Service:  SNF (31)   CODE STATUS: full   No Known Allergies  Chief Complaint  Patient presents with   Acute Visit    Weight loss     HPI:  He has had a slow progressive weight loss. He has lost 6.6 pounds over the past 6 month. His current weight is 150.7 pounds. He has a flat affect and does have some mild generalized pain. He has right upper lung cancer for which he has undergone chemotherapy last June. He has a poor appetite stating that he does not like the food.   Past Medical History:  Diagnosis Date   Arthritis    Hypertension    Hyperthyroidism    lung ca 11/2020    Past Surgical History:  Procedure Laterality Date   COLONOSCOPY N/A 09/02/2016   Procedure: COLONOSCOPY;  Surgeon: [name], MD;  Location: [facility];  Service: Endoscopy;  Laterality: N/A;  830   NO PAST SURGERIES     POLYPECTOMY  09/02/2016   Procedure: POLYPECTOMY;  Surgeon: [name], MD;  Location: [facility];  Service: Endoscopy;;    Social History   Socioeconomic History   Marital status: Divorced    Spouse name: Not on file   Number of children: Not on file   Years of education: Not on file   Highest education level: Not on file  Occupational History   Not on file  Tobacco Use   Smoking status: Former    Current packs/day: 0.00    Average packs/day: 1.5 packs/day for 40.0 years (60.0 ttl pk-yrs)    Types: Cigarettes    Start date: 49    Quit date: 2000    Years since quitting: 24.8   Smokeless tobacco: Never  Substance and Sexual Activity   Alcohol use: No   Drug use: No   Sexual activity: Not on file  Other Topics Concern   Not on file  Social History Narrative   Not on file   Social Determinants of Health   Financial Resource Strain: Not on file  Food Insecurity: No Food Insecurity (07/08/2023)   Hunger Vital Sign    Worried About Running Out of Food in the Last Year: Never  true    Ran Out of Food in the Last Year: Never true  Transportation Needs: No Transportation Needs (07/08/2023)   PRAPARE - Administrator, Civil Service (Medical): No    Lack of Transportation (Non-Medical): No  Physical Activity: Not on file  Stress: Not on file  Social Connections: Not on file  Intimate Partner Violence: Not At Risk (07/08/2023)   Humiliation, Afraid, Rape, and Kick questionnaire    Fear of Current or Ex-Partner: No    Emotionally Abused: No    Physically Abused: No    Sexually Abused: No   Family History  Problem Relation Age of Onset   Colon cancer Other       VITAL SIGNS BP 136/66   Pulse 70   Temp (!) 97.5 F (36.4 C)   Resp 18   Ht 6\' 2"  (1.88 m)   Wt 150 lb 11.2 oz (68.4 kg)   SpO2 97%   BMI 19.35 kg/m   Outpatient Encounter Medications as of 08/03/2023  Medication Sig   amLODipine (NORVASC) 10 MG tablet Take 10 mg by mouth daily as needed.   calcium carbonate (OSCAL) 1500 (600 Ca) MG TABS  tablet Take 500 mg by mouth daily with breakfast. For calcium supplement   diclofenac Sodium (VOLTAREN) 1 % GEL APPLY 2 GRAMS TO THE AFFECTED AREA(S) BY TOPICAL ROUTE 2 TIMES PER DAY (Patient not taking: Reported on 07/30/2023)   ferrous sulfate 325 (65 FE) MG EC tablet Take 325 mg by mouth 3 (three) times a week.   fluticasone furoate-vilanterol (BREO ELLIPTA) 100-25 MCG/ACT AEPB Inhale 1 puff into the lungs daily.   furosemide (LASIX) 40 MG tablet Take 1 tablet (40 mg total) by mouth daily.   gabapentin (NEURONTIN) 250 MG/5ML solution Take 3 mLs (150 mg total) by mouth at bedtime.   hydrochlorothiazide (MICROZIDE) 12.5 MG capsule Take 12.5 mg by mouth daily. (Patient not taking: Reported on 07/30/2023)   ibuprofen (ADVIL) 200 MG tablet Take 200 mg by mouth every 6 (six) hours as needed for mild pain.   Ipratropium-Albuterol (COMBIVENT RESPIMAT) 20-100 MCG/ACT AERS respimat Inhale 1 puff into the lungs every 6 (six) hours as needed.   labetalol  (NORMODYNE) 200 MG tablet Take 200 mg by mouth 2 (two) times daily.   levothyroxine (SYNTHROID) 150 MCG tablet Take 150 mcg by mouth daily.   loperamide (IMODIUM) 2 MG capsule Take by mouth as needed for diarrhea or loose stools. (Patient not taking: Reported on 07/30/2023)   Protein (PROSOURCE PO) Take 30 mLs by mouth 3 (three) times daily.   tadalafil (CIALIS) 5 MG tablet Take 5 mg by mouth daily.   tamsulosin (FLOMAX) 0.4 MG CAPS capsule Take 0.4 mg by mouth daily.   No facility-administered encounter medications on file as of 08/03/2023.     SIGNIFICANT DIAGNOSTIC EXAMS  PREVIOUS   07-07-23: wbc 5.3; hgb 10.2; hct 32.9; mcv 95.9 plt 167; glucose 98; bun 26; creat 2.04; k+ 3.9; na++ 137; ca 8.0; gfr 31; protein 7.3 albumin 2.7 mag 1.9 07-08-23: tsh 9.279 vitamin B12: 479; folate 7.5; iron 21; tibc 168 07-15-23: wbc 3.9; hgb 8.3; hct 27.1; mcv 98.2 plt 181; glucose 105; bun 39; creat 2.23; k+ 4.3; na++ 137; ca 7.7; gfr 28; mag 1.8  07-20-23: wbc 4.5; hgb 8.1; hct 26.4; mcv 100.4 plt 175; glucose 93; bun 45; creat 2.08; k+ 4.3; na++ 138; ca 7.5; gfr 30 protein 5.5 albumin 2.1; tsh 6.070  NO NEW LABS   Review of Systems  Constitutional:  Negative for malaise/fatigue.  Respiratory:  Negative for cough and shortness of breath.   Cardiovascular:  Negative for chest pain, palpitations and leg swelling.  Gastrointestinal:  Negative for abdominal pain, constipation and heartburn.  Musculoskeletal:  Positive for myalgias. Negative for back pain and joint pain.  Skin: Negative.   Neurological:  Negative for dizziness.  Psychiatric/Behavioral:  The patient is not nervous/anxious.     Physical Exam Constitutional:      General: He is not in acute distress.    Appearance: He is well-developed. He is not diaphoretic.  Neck:     Thyroid: No thyromegaly.  Cardiovascular:     Rate and Rhythm: Normal rate and regular rhythm.     Pulses: Normal pulses.     Heart sounds: Normal heart sounds.   Pulmonary:     Effort: Pulmonary effort is normal. No respiratory distress.     Breath sounds: Normal breath sounds.  Abdominal:     General: Bowel sounds are normal. There is no distension.     Palpations: Abdomen is soft.     Tenderness: There is no abdominal tenderness.  Genitourinary:    Comments: foley Musculoskeletal:  General: Normal range of motion.     Cervical back: Neck supple.     Right lower leg: Edema present.     Left lower leg: Edema present.  Lymphadenopathy:     Cervical: No cervical adenopathy.  Skin:    General: Skin is warm and dry.  Neurological:     Mental Status: He is alert. Mental status is at baseline.  Psychiatric:        Mood and Affect: Mood normal.     Comments: Has flat affect      ASSESSMENT/ PLAN:  TODAY  Chronic pain syndrome Weight loss Situational depression  There is concern for depression due to his recent cancer diagnosis and treatment; now being in an SNF. Will begin zoloft 50 mg daily    Synthia Innocent NP [facility]   call [phone]

## 2023-08-04 NOTE — Telephone Encounter (Signed)
Daughter called. Pt is in rehab and has lab appt scheduled for Friday (CT CAP to be scheduled). Patient is asking if this needed as the MDs in hospital told him the treatment didn't work. Had CT Chest in hospital on 10/9. Please advise

## 2023-08-05 NOTE — Telephone Encounter (Signed)
Spoke with pt's Son in Social worker, [place]. Per Dr. Arbutus Ped-  We will see pt once discharged.  Appt for 11/8 canceled. Informed [name] to contact us once pt is discharged and will get him rescheduled.

## 2023-08-06 ENCOUNTER — Inpatient Hospital Stay: Payer: Medicare HMO

## 2023-08-09 ENCOUNTER — Ambulatory Visit (INDEPENDENT_AMBULATORY_CARE_PROVIDER_SITE_OTHER): Payer: Medicare HMO | Admitting: Gastroenterology

## 2023-08-09 DIAGNOSIS — N2581 Secondary hyperparathyroidism of renal origin: Secondary | ICD-10-CM | POA: Diagnosis not present

## 2023-08-09 DIAGNOSIS — D631 Anemia in chronic kidney disease: Secondary | ICD-10-CM | POA: Diagnosis not present

## 2023-08-09 DIAGNOSIS — I5030 Unspecified diastolic (congestive) heart failure: Secondary | ICD-10-CM | POA: Diagnosis not present

## 2023-08-09 DIAGNOSIS — N184 Chronic kidney disease, stage 4 (severe): Secondary | ICD-10-CM | POA: Diagnosis not present

## 2023-08-09 DIAGNOSIS — N189 Chronic kidney disease, unspecified: Secondary | ICD-10-CM | POA: Diagnosis not present

## 2023-08-09 DIAGNOSIS — R339 Retention of urine, unspecified: Secondary | ICD-10-CM | POA: Diagnosis not present

## 2023-08-09 DIAGNOSIS — I129 Hypertensive chronic kidney disease with stage 1 through stage 4 chronic kidney disease, or unspecified chronic kidney disease: Secondary | ICD-10-CM | POA: Diagnosis not present

## 2023-08-09 DIAGNOSIS — F4321 Adjustment disorder with depressed mood: Secondary | ICD-10-CM | POA: Insufficient documentation

## 2023-08-09 DIAGNOSIS — R634 Abnormal weight loss: Secondary | ICD-10-CM | POA: Insufficient documentation

## 2023-08-12 ENCOUNTER — Inpatient Hospital Stay: Payer: Medicare HMO | Attending: Physician Assistant | Admitting: Internal Medicine

## 2023-08-12 ENCOUNTER — Inpatient Hospital Stay: Payer: Medicare HMO

## 2023-08-12 ENCOUNTER — Other Ambulatory Visit: Payer: Self-pay | Admitting: Medical Oncology

## 2023-08-12 VITALS — BP 112/56 | HR 56 | Temp 97.6°F | Resp 17 | Ht 74.0 in | Wt 144.6 lb

## 2023-08-12 DIAGNOSIS — C3490 Malignant neoplasm of unspecified part of unspecified bronchus or lung: Secondary | ICD-10-CM

## 2023-08-12 DIAGNOSIS — Z85118 Personal history of other malignant neoplasm of bronchus and lung: Secondary | ICD-10-CM | POA: Insufficient documentation

## 2023-08-12 DIAGNOSIS — Z08 Encounter for follow-up examination after completed treatment for malignant neoplasm: Secondary | ICD-10-CM | POA: Diagnosis not present

## 2023-08-12 DIAGNOSIS — Z9221 Personal history of antineoplastic chemotherapy: Secondary | ICD-10-CM | POA: Diagnosis not present

## 2023-08-12 LAB — CMP (CANCER CENTER ONLY)
ALT: 13 U/L (ref 0–44)
AST: 7 U/L — ABNORMAL LOW (ref 15–41)
Albumin: 3 g/dL — ABNORMAL LOW (ref 3.5–5.0)
Alkaline Phosphatase: 77 U/L (ref 38–126)
Anion gap: 5 (ref 5–15)
BUN: 59 mg/dL — ABNORMAL HIGH (ref 8–23)
CO2: 29 mmol/L (ref 22–32)
Calcium: 8.5 mg/dL — ABNORMAL LOW (ref 8.9–10.3)
Chloride: 107 mmol/L (ref 98–111)
Creatinine: 2.51 mg/dL — ABNORMAL HIGH (ref 0.61–1.24)
GFR, Estimated: 24 mL/min — ABNORMAL LOW (ref >60)
Glucose, Bld: 94 mg/dL (ref 70–99)
Potassium: 4.4 mmol/L (ref 3.5–5.1)
Sodium: 141 mmol/L (ref 135–145)
Total Bilirubin: 0.4 mg/dL (ref <1.2)
Total Protein: 7.1 g/dL (ref 6.5–8.1)

## 2023-08-12 LAB — CBC WITH DIFFERENTIAL (CANCER CENTER ONLY)
Abs Immature Granulocytes: 0.06 10*3/uL (ref 0.00–0.07)
Basophils Absolute: 0 10*3/uL (ref 0.0–0.1)
Basophils Relative: 0 %
Eosinophils Absolute: 0.1 10*3/uL (ref 0.0–0.5)
Eosinophils Relative: 2 %
HCT: 28.2 % — ABNORMAL LOW (ref 39.0–52.0)
Hemoglobin: 9.1 g/dL — ABNORMAL LOW (ref 13.0–17.0)
Immature Granulocytes: 2 %
Lymphocytes Relative: 17 %
Lymphs Abs: 0.6 10*3/uL — ABNORMAL LOW (ref 0.7–4.0)
MCH: 31.2 pg (ref 26.0–34.0)
MCHC: 32.3 g/dL (ref 30.0–36.0)
MCV: 96.6 fL (ref 80.0–100.0)
Monocytes Absolute: 0.3 10*3/uL (ref 0.1–1.0)
Monocytes Relative: 9 %
Neutro Abs: 2.6 10*3/uL (ref 1.7–7.7)
Neutrophils Relative %: 70 %
Platelet Count: 181 10*3/uL (ref 150–400)
RBC: 2.92 MIL/uL — ABNORMAL LOW (ref 4.22–5.81)
RDW: 16 % — ABNORMAL HIGH (ref 11.5–15.5)
WBC Count: 3.7 10*3/uL — ABNORMAL LOW (ref 4.0–10.5)
nRBC: 0 % (ref 0.0–0.2)

## 2023-08-12 LAB — SAMPLE TO BLOOD BANK

## 2023-08-12 NOTE — Progress Notes (Signed)
[facility] [phone]   Fax:([phone]  OFFICE PROGRESS NOTE  Brian Stabile, MD [address] 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  Biomarker Findings Tumor Mutational Burden - 10 Muts/Mb Microsatellite status - MS-Stable Genomic Findings For a complete list of the genes assayed, please refer to the Appendix. KRAS G12C NFKBIA amplification NKX2-1 amplification RBM10 D627fs*80 7 Disease relevant genes with no reportable alterations: ALK, BRAF, EGFR, ERBB2, MET, RET, ROS1   PDL1: 90%   PRIOR THERAPY:   1) Weekly concurrent chemoradiation with carboplatin for an AUC of 2 and paclitaxel 45 mg/m.  First dose on 12/30/2020.  Status post 6 cycles. 2) First-line treatment with immunotherapy with Libtayo (Cempilimab) 350 Mg IV every 3 weeks.  Status post 32 cycles.   CURRENT THERAPY: Observation.  INTERVAL HISTORY: [name] 89 y.o. male returns to the clinic today for follow-up visit accompanied by his son-in-law.Discussed the use of AI scribe software for clinical note transcription with the patient, who gave verbal consent to proceed.  History of Present Illness   The patient, an 89 year old with a history of stage four non-small cell lung cancer (adenosquamous carcinoma), was diagnosed in March 2022. He underwent chemotherapy and radiation with weekly carboplatin and paclitaxel, followed by 32 cycles of Libtayo every three weeks, with the last cycle administered in April of the current year.  Recently, the patient was hospitalized due to leg swelling and mild shortness of breath. During the hospital stay, he was started on Lasix and supplemental oxygen,  which he has continued to use since discharge. Prior to this, the patient did not require oxygen therapy.  A CT scan performed during the hospitalization in October revealed a concerning density in the right lung, potentially indicative of cancer progression. However, the patient reports feeling well currently, with no new or worsening symptoms. He denies chest pain, hemoptysis, nausea, vomiting, diarrhea, or recent weight loss.  The patient also has a noted skin color change, with progressive vitiligo since the completion of immunotherapy. Despite this, he reports feeling well overall.        MEDICAL HISTORY: Past Medical History:  Diagnosis Date   Arthritis    Hypertension    Hyperthyroidism    lung ca 11/2020    ALLERGIES:  has No Known Allergies.  MEDICATIONS:  Current Outpatient Medications  Medication Sig Dispense Refill   amLODipine (NORVASC) 10 MG tablet Take 10 mg by mouth daily as needed.     calcium carbonate (OSCAL) 1500 (600 Ca) MG TABS tablet Take 500 mg by mouth daily with breakfast. For calcium supplement     ferrous sulfate 325 (65 FE) MG EC tablet Take 325 mg by mouth 3 (three) times a week.     fluticasone furoate-vilanterol (BREO ELLIPTA) 100-25 MCG/ACT AEPB Inhale 1 puff into the lungs daily. 28 each 0   furosemide (LASIX) 40 MG tablet Take 1 tablet (40 mg total) by mouth daily. 30 tablet 0   gabapentin (NEURONTIN) 250 MG/5ML solution Take 3 mLs (150 mg total) by mouth at bedtime. 90 mL 2   Ipratropium-Albuterol (COMBIVENT RESPIMAT) 20-100 MCG/ACT AERS respimat Inhale 1 puff into the lungs every 6 (six) hours as needed. 4 g 1  labetalol (NORMODYNE) 200 MG tablet Take 200 mg by mouth 2 (two) times daily.     levothyroxine (SYNTHROID) 150 MCG tablet Take 150 mcg by mouth daily.     Protein (PROSOURCE PO) Take 30 mLs by mouth 3 (three) times daily.     sertraline (ZOLOFT) 50 MG tablet Take 50 mg by mouth daily.     tadalafil (CIALIS) 5 MG tablet Take 5 mg by mouth  daily.     tamsulosin (FLOMAX) 0.4 MG CAPS capsule Take 0.4 mg by mouth daily.     No current facility-administered medications for this visit.    SURGICAL HISTORY:  Past Surgical History:  Procedure Laterality Date   COLONOSCOPY N/A 09/02/2016   Procedure: COLONOSCOPY;  Surgeon: [name], MD;  Location: [facility];  Service: Endoscopy;  Laterality: N/A;  830   NO PAST SURGERIES     POLYPECTOMY  09/02/2016   Procedure: POLYPECTOMY;  Surgeon: [name], MD;  Location: [facility];  Service: Endoscopy;;    REVIEW OF SYSTEMS:  Constitutional: positive for fatigue Eyes: negative Ears, nose, mouth, throat, and face: negative Respiratory: positive for dyspnea on exertion Cardiovascular: negative Gastrointestinal: negative Genitourinary:negative Integument/breast: negative Hematologic/lymphatic: negative Musculoskeletal:negative Neurological: negative Behavioral/Psych: negative Endocrine: negative Allergic/Immunologic: negative   PHYSICAL EXAMINATION: General appearance: alert, cooperative, and no distress Head: Normocephalic, without obvious abnormality, atraumatic Neck: no adenopathy, no JVD, supple, symmetrical, trachea midline, and thyroid not enlarged, symmetric, no tenderness/mass/nodules Lymph nodes: Cervical, supraclavicular, and axillary nodes normal. Resp: clear to auscultation bilaterally Back: symmetric, no curvature. ROM normal. No CVA tenderness. Cardio: regular rate and rhythm, S1, S2 normal, no murmur, click, rub or gallop GI: soft, non-tender; bowel sounds normal; no masses,  no organomegaly Extremities: edema 1+ edema bilaterally Neurologic: Alert and oriented X 3, normal strength and tone. Normal symmetric reflexes. Normal coordination and gait  ECOG PERFORMANCE STATUS: 1 - Symptomatic but completely ambulatory  Blood pressure (!) 112/56, pulse (!) 56, temperature 97.6 F (36.4 C), temperature source Temporal, resp. rate 17, height 6\' 2"  (1.88  m), weight 144 lb 9.6 oz (65.6 kg), SpO2 98%, peak flow (!) 2 L/min.  LABORATORY DATA: Lab Results  Component Value Date   WBC 3.7 (L) 08/12/2023   HGB 9.1 (L) 08/12/2023   HCT 28.2 (L) 08/12/2023   MCV 96.6 08/12/2023   PLT 181 08/12/2023      Chemistry      Component Value Date/Time   NA 139 07/26/2023 0500   K 4.8 07/26/2023 0500   CL 106 07/26/2023 0500   CO2 27 07/26/2023 0500   BUN 50 (H) 07/26/2023 0500   CREATININE 2.18 (H) 07/26/2023 0500   CREATININE 4.65 (H) 02/23/2023 1005      Component Value Date/Time   CALCIUM 7.8 (L) 07/26/2023 0500   ALKPHOS 58 07/26/2023 0500   AST 16 07/26/2023 0500   AST 7 (L) 02/23/2023 1005   ALT 21 07/26/2023 0500   ALT 6 02/23/2023 1005   BILITOT 0.4 07/26/2023 0500   BILITOT 0.3 02/23/2023 1005       RADIOGRAPHIC STUDIES: No results found.  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  The patient underwent a course of concurrent chemoradiation with weekly carboplatin for AUC of 2 and paclitaxel 45 Mg/M2 status post 6 cycles. He tolerated the previous course of his concurrent chemoradiation fairly well except for fatigue. Unfortunately his CT scan of the chest after the induction phase showed interval enlargement of a large mass centered at the periphery of the right upper lobe and extending into the adjacent chest wall measuring 8.4 x 6.7 cm with bony destruction of the right third and fourth ribs substantially increased compared to the prior examination and the findings are consistent with worsened malignancy. The patient has PD-L1 expression of 90% and I felt  he will be a good candidate for treatment with first-line single agent immunotherapy. I recommended for him treatment with Libtayo (Cempilimab) 350 Mg IV every 3 weeks.  He is status post 32 cycles of treatment.  He has been on observation since April 2024 after completing almost 2 years of treatment.  The patient is currently on observation.    Stage IV Non-Small Cell Lung Cancer (NSCLC) - Adenosquamous Carcinoma Diagnosed in March 2022. Treated with chemoradiation (carboplatin and paclitaxel) followed by Lucianne Lei (cemiplimab) every three weeks for 32 cycles, last cycle in April 2024. Recent CT scan shows density on the right side, concerning for potential cancer growth. Differential diagnosis includes cancer progression versus inflammation. KRAS G12C mutation present, which may allow for targeted oral therapy if cancer is confirmed to be growing. Informed consent discussed regarding PET scan to confirm diagnosis before proceeding with targeted therapy. - Order PET scan in two weeks to assess right lung density - Follow-up appointment one week after PET scan to review results and determine further treatment if necessary  Leg Edema Recent hospitalization in October 2024 for leg swelling. Treated with diuretics (Lasix). Currently on home oxygen therapy since hospital discharge. No current chest pain, hemoptysis, nausea, vomiting, diarrhea, or recent weight loss. Shortness of breath is well-managed. - Continue home oxygen therapy  Vitiligo Developed as a side effect of immunotherapy. No improvement noted post-therapy; condition persists. - Monitor skin changes  Follow-up - Follow-up appointment after Thanksgiving to review PET scan results.   The patient was advised to call immediately if he has any concerning symptoms in the interval. The patient voices understanding of current disease status and treatment options and is in agreement with the current care plan.  All questions were answered. The  patient knows to call the clinic with any problems, questions or concerns. We can certainly see the patient much sooner if necessary. The total time spent in the appointment was 30 minutes.  Disclaimer: This note was dictated with voice recognition software. Similar sounding words can inadvertently be transcribed and may not be corrected upon review.

## 2023-08-16 ENCOUNTER — Ambulatory Visit: Payer: Medicare HMO | Admitting: Dermatology

## 2023-08-20 ENCOUNTER — Other Ambulatory Visit: Payer: Self-pay

## 2023-08-20 ENCOUNTER — Ambulatory Visit (INDEPENDENT_AMBULATORY_CARE_PROVIDER_SITE_OTHER): Payer: Medicare HMO | Admitting: Gastroenterology

## 2023-08-20 ENCOUNTER — Encounter (HOSPITAL_COMMUNITY): Payer: Self-pay

## 2023-08-20 ENCOUNTER — Emergency Department (HOSPITAL_COMMUNITY)
Admission: EM | Admit: 2023-08-20 | Discharge: 2023-08-21 | Disposition: A | Discharge status: Home / Self Care | Payer: Medicare HMO | Attending: Emergency Medicine | Admitting: Emergency Medicine

## 2023-08-20 ENCOUNTER — Encounter (INDEPENDENT_AMBULATORY_CARE_PROVIDER_SITE_OTHER): Payer: Self-pay | Admitting: Gastroenterology

## 2023-08-20 VITALS — BP 84/46 | HR 61 | Temp 97.3°F | Ht 74.0 in | Wt 142.0 lb

## 2023-08-20 DIAGNOSIS — E860 Dehydration: Secondary | ICD-10-CM | POA: Diagnosis not present

## 2023-08-20 DIAGNOSIS — N179 Acute kidney failure, unspecified: Secondary | ICD-10-CM | POA: Insufficient documentation

## 2023-08-20 DIAGNOSIS — R195 Other fecal abnormalities: Secondary | ICD-10-CM | POA: Insufficient documentation

## 2023-08-20 DIAGNOSIS — I959 Hypotension, unspecified: Secondary | ICD-10-CM | POA: Insufficient documentation

## 2023-08-20 DIAGNOSIS — N189 Chronic kidney disease, unspecified: Secondary | ICD-10-CM | POA: Insufficient documentation

## 2023-08-20 DIAGNOSIS — D649 Anemia, unspecified: Secondary | ICD-10-CM | POA: Insufficient documentation

## 2023-08-20 DIAGNOSIS — R531 Weakness: Secondary | ICD-10-CM | POA: Diagnosis not present

## 2023-08-20 DIAGNOSIS — Z85118 Personal history of other malignant neoplasm of bronchus and lung: Secondary | ICD-10-CM | POA: Diagnosis not present

## 2023-08-20 DIAGNOSIS — I509 Heart failure, unspecified: Secondary | ICD-10-CM | POA: Insufficient documentation

## 2023-08-20 LAB — COMPREHENSIVE METABOLIC PANEL
ALT: 18 U/L (ref 0–44)
AST: 16 U/L (ref 15–41)
Albumin: 2.7 g/dL — ABNORMAL LOW (ref 3.5–5.0)
Alkaline Phosphatase: 75 U/L (ref 38–126)
Anion gap: 9 (ref 5–15)
BUN: 74 mg/dL — ABNORMAL HIGH (ref 8–23)
CO2: 24 mmol/L (ref 22–32)
Calcium: 8.3 mg/dL — ABNORMAL LOW (ref 8.9–10.3)
Chloride: 99 mmol/L (ref 98–111)
Creatinine, Ser: 3.13 mg/dL — ABNORMAL HIGH (ref 0.61–1.24)
GFR, Estimated: 19 mL/min — ABNORMAL LOW (ref >60)
Glucose, Bld: 94 mg/dL (ref 70–99)
Potassium: 4.6 mmol/L (ref 3.5–5.1)
Sodium: 132 mmol/L — ABNORMAL LOW (ref 135–145)
Total Bilirubin: 0.4 mg/dL (ref <1.2)
Total Protein: 7.5 g/dL (ref 6.5–8.1)

## 2023-08-20 LAB — CBC WITH DIFFERENTIAL/PLATELET
Abs Immature Granulocytes: 0.02 10*3/uL (ref 0.00–0.07)
Basophils Absolute: 0 10*3/uL (ref 0.0–0.1)
Basophils Relative: 0 %
Eosinophils Absolute: 0 10*3/uL (ref 0.0–0.5)
Eosinophils Relative: 0 %
HCT: 31.9 % — ABNORMAL LOW (ref 39.0–52.0)
Hemoglobin: 9.9 g/dL — ABNORMAL LOW (ref 13.0–17.0)
Immature Granulocytes: 0 %
Lymphocytes Relative: 12 %
Lymphs Abs: 0.6 10*3/uL — ABNORMAL LOW (ref 0.7–4.0)
MCH: 30.2 pg (ref 26.0–34.0)
MCHC: 31 g/dL (ref 30.0–36.0)
MCV: 97.3 fL (ref 80.0–100.0)
Monocytes Absolute: 0.4 10*3/uL (ref 0.1–1.0)
Monocytes Relative: 8 %
Neutro Abs: 3.9 10*3/uL (ref 1.7–7.7)
Neutrophils Relative %: 80 %
Platelets: 210 10*3/uL (ref 150–400)
RBC: 3.28 MIL/uL — ABNORMAL LOW (ref 4.22–5.81)
RDW: 15.2 % (ref 11.5–15.5)
WBC: 5 10*3/uL (ref 4.0–10.5)
nRBC: 0 % (ref 0.0–0.2)

## 2023-08-20 LAB — POC OCCULT BLOOD, ED: Fecal Occult Blood: NEGATIVE — NL

## 2023-08-20 LAB — TYPE AND SCREEN
ABO/RH(D): O POS
Antibody Screen: NEGATIVE

## 2023-08-20 MED ORDER — SODIUM CHLORIDE 0.9 % IV BOLUS
1000.0000 mL | Freq: Once | INTRAVENOUS | Status: AC
  Administered 2023-08-20: 1000 mL via INTRAVENOUS

## 2023-08-20 NOTE — ED Provider Notes (Signed)
[place] EMERGENCY DEPARTMENT AT [facility] Provider Note   CSN: [id number] Arrival date & time: 08/20/23  1124     History  Chief Complaint  Patient presents with   Hypotension    [name] is a 89 y.o. male.  He has a history of lung cancer CHF CKD.  He is currently at nursing home getting rehabilitation.  He was over to the GI clinic today due to some concern for some rectal bleeding and they found his blood pressure to be low and sent him over here for further evaluation.  Patient does not know if he has had any bleeding.  He does endorse feeling generally weak over the last 6 months but not particularly weak recently.  Minimal cough no chest pain no abdominal pain.  No vomiting or diarrhea no urinary symptoms.  He is not on any blood thinners.  No chest pain or shortness of breath  The history is provided by the patient.  Weakness Severity:  Moderate Associated symptoms: cough   Associated symptoms: no abdominal pain, no chest pain, no diarrhea, no fever, no nausea, no shortness of breath and no vomiting        Home Medications Prior to Admission medications   Medication Sig Start Date End Date Taking? Authorizing Provider  amLODipine (NORVASC) 10 MG tablet Take 10 mg by mouth daily as needed. 01/16/22   [provider]  calcium carbonate (OSCAL) 1500 (600 Ca) MG TABS tablet Take 500 mg by mouth daily with breakfast. For calcium supplement    [provider]  ferrous sulfate 325 (65 FE) MG EC tablet Take 325 mg by mouth 3 (three) times a week.    [provider]  fluticasone-salmeterol (ADVAIR) 250-50 MCG/ACT AEPB Inhale 1 puff into the lungs in the morning and at bedtime.    [provider]  furosemide (LASIX) 40 MG tablet Take 1 tablet (40 mg total) by mouth daily. 07/16/23   [name], [name], DO  gabapentin (NEURONTIN) 250 MG/5ML solution Take 3 mLs (150 mg total) by mouth at bedtime. 01/13/21   Tanner, Kathrin Greathouse., PA-C   ibuprofen (ADVIL) 200 MG tablet Take 200 mg by mouth every 6 (six) hours as needed.    [provider]  Ipratropium-Albuterol (COMBIVENT RESPIMAT) 20-100 MCG/ACT AERS respimat Inhale 1 puff into the lungs every 6 (six) hours as needed. 07/15/23   [name], [name], DO  labetalol (NORMODYNE) 200 MG tablet Take 200 mg by mouth 2 (two) times daily. 04/26/23   [provider]  levothyroxine (SYNTHROID) 150 MCG tablet Take 150 mcg by mouth daily. 06/06/22   [provider]  Protein (PROSOURCE PO) Take 30 mLs by mouth 3 (three) times daily.    [provider]  sertraline (ZOLOFT) 50 MG tablet Take 50 mg by mouth daily.    [provider]  tadalafil (CIALIS) 5 MG tablet Take 5 mg by mouth daily.    [provider]  tamsulosin (FLOMAX) 0.4 MG CAPS capsule Take 0.4 mg by mouth daily.    [provider]      Allergies    Patient has no known allergies.    Review of Systems   Review of Systems  Constitutional:  Negative for fever.  Respiratory:  Positive for cough. Negative for shortness of breath.   Cardiovascular:  Negative for chest pain.  Gastrointestinal:  Negative for abdominal pain, diarrhea, nausea and vomiting.  Neurological:  Positive for weakness.    Physical  Exam Updated Vital Signs BP 108/61 (BP Location: Left Arm)   Pulse (!) 55   Temp 97.8 F (36.6 C)   Resp 14   Ht 6\' 2"  (1.88 m)   Wt 64.4 kg   SpO2 99%   BMI 18.23 kg/m  Physical Exam Vitals and nursing note reviewed.  Constitutional:      General: He is not in acute distress.    Appearance: Normal appearance. He is well-developed.  HENT:     Head: Normocephalic and atraumatic.  Eyes:     Conjunctiva/sclera: Conjunctivae normal.  Cardiovascular:     Rate and Rhythm: Normal rate and regular rhythm.     Heart sounds: No murmur heard. Pulmonary:     Effort: Pulmonary effort is normal. No respiratory distress.     Breath sounds: Normal breath sounds.   Abdominal:     Palpations: Abdomen is soft.     Tenderness: There is no abdominal tenderness. There is no guarding or rebound.  Genitourinary:    Rectum: Normal.     Comments: Rectal exam does not show any masses, has normal tone.  Guaiac is negative. Musculoskeletal:        General: No swelling.     Cervical back: Neck supple.  Skin:    General: Skin is warm and dry.     Capillary Refill: Capillary refill takes less than 2 seconds.     Findings: Rash present.     Comments: He has patchy areas of hypopigmentation consistent with vitiligo  Neurological:     General: No focal deficit present.     Mental Status: He is alert.     Comments: Is able to move all extremities although was generally weak.  Normal speech no facial asymmetry     ED Results / Procedures / Treatments   Labs (all labs ordered are listed, but only abnormal results are displayed) Labs Reviewed  CBC WITH DIFFERENTIAL/PLATELET - Abnormal; Notable for the following components:      Result Value   RBC 3.28 (*)    Hemoglobin 9.9 (*)    HCT 31.9 (*)    Lymphs Abs 0.6 (*)    All other components within normal limits  COMPREHENSIVE METABOLIC PANEL - Abnormal; Notable for the following components:   Sodium 132 (*)    BUN 74 (*)    Creatinine, Ser 3.13 (*)    Calcium 8.3 (*)    Albumin 2.7 (*)    GFR, Estimated 19 (*)    All other components within normal limits  POC OCCULT BLOOD, ED  TYPE AND SCREEN    EKG EKG Interpretation Date/Time:  Friday August 20 2023 11:54:49 EST Ventricular Rate:  57 PR Interval:  308 QRS Duration:  136 QT Interval:  458 QTC Calculation: 445 R Axis:   56  Text Interpretation: Sinus bradycardia with 1st degree A-V block Right bundle branch block Abnormal ECG When compared with ECG of 07-Jul-2023 17:24, rate is slower Confirmed by Meridee Score [phone]) on 08/20/2023 1:48:46 PM  Radiology No results found.  Procedures Procedures    Medications Ordered in  ED Medications  sodium chloride 0.9 % bolus 1,000 mL (0 mLs Intravenous Stopped 08/20/23 1609)    ED Course/ Medical Decision Making/ A&P Clinical Course as of 08/20/23 1740  Fri Aug 20, 2023  1443 Patient's hemoglobin better than his baseline and he is heme-negative here.  He does have an AKI.  Will give IV fluids.  Son said he is fine if he needs  to stay in the hospital and he is also okay with him going back to the facility if he is doing better. [MB]    Clinical Course User Index [MB] Terrilee Files, MD                                 Medical Decision Making Amount and/or Complexity of Data Reviewed Labs: ordered. ECG/medicine tests: ordered.   This patient complains of weakness low blood pressure possible GI bleeding; this involves an extensive number of treatment Options and is a complaint that carries with it a high risk of complications and morbidity. The differential includes anemia, GI bleeding, sepsis, dehydration, renal failure  I ordered, reviewed and interpreted labs, which included CBC with normal white count, stable hemoglobin, chemistries with worsening BUN and creatinine, fecal occult negative I ordered medication IV fluids and reviewed PMP when indicated. Additional history obtained from patient's son Previous records obtained and reviewed in epic including recent oncology notes Cardiac monitoring reviewed, sinus rhythm Social determinants considered, no significant barriers Critical Interventions: None  After the interventions stated above, I reevaluated the patient and found patient's blood pressure to be trending up and he is fairly asymptomatic Admission and further testing considered, and giving him IV fluids and his care is signed out to Dr. Deretha Emory.  Plan is to reassess after fluids.  Possible admission if he is not perking up but otherwise can be returned back to his facility where they can continue to monitor.         Final Clinical  Impression(s) / ED Diagnoses Final diagnoses:  Dehydration  AKI (acute kidney injury) (HCC)  Weakness    Rx / DC Orders ED Discharge Orders     None         Terrilee Files, MD 08/20/23 1742

## 2023-08-20 NOTE — ED Provider Triage Note (Signed)
Emergency Medicine Provider Triage Evaluation Note  [name] , a 89 y.o. male  was evaluated in triage.  Pt presents for evaluation of hypotension and positive fecal occult positive noted at GI office today. Famiyl at bedside report that nursing home have reported bloody stool but they are unsure how long it has been occurring  Review of Systems  Positive: hematochezia Negative: Abd pain, vomiting, fevers  Physical Exam  BP 108/61 (BP Location: Left Arm)   Pulse (!) 55   Temp 97.8 F (36.6 C)   Resp 14   Ht 6\' 2"  (1.88 m)   Wt 64.4 kg   SpO2 99%   BMI 18.23 kg/m  Gen:   Awake, no distress   Resp:  Normal effort  MSK:   Moves extremities without difficulty  Other:    Medical Decision Making  Medically screening exam initiated at 1:16 PM.  Appropriate orders placed.  MAKEI MITROVICH was informed that the remainder of the evaluation will be completed by another provider, this initial triage assessment does not replace that evaluation, and the importance of remaining in the ED until their evaluation is complete.  Labs ordered   Judithann Sheen, [place] 08/20/23 1318

## 2023-08-20 NOTE — Discharge Instructions (Signed)
Patient with evidence of dehydration here.  Patient received IV fluids.  No nausea or vomiting.  There was no evidence of any GI bleed.  Rectal exam was Hemoccult negative.  Blood pressures were low initially.  But after little bit of fluid they have come up and state.  No significant electrolyte abnormalities.  Return for any new or worse symptoms.

## 2023-08-20 NOTE — ED Triage Notes (Signed)
Pt at GI MD today for blood in stool  Pt lives in rehab  Brought by son  GI sent to ED for hypotension  BP in triage 83/50  Pt wears 2LPM O2 at all times for COPD  Son and pt unsure how long pt has had blood in stool

## 2023-08-20 NOTE — ED Notes (Signed)
Patient given sandwich, crackers and juice.

## 2023-08-20 NOTE — Progress Notes (Signed)
Vista Lawman , M.D. Gastroenterology & Hepatology [facility] Gastroenterology [address] [place], [place] [address] Primary Care Physician: [name], MD [address] [name] [address]  Chief Complaint: Positive Hemoccult  History of Present Illness: DIANGELO BIBER is a 89 y.o. male with African-American male recently diagnosed with stage IVA (T4, N3, M1b) non-small cell lung cancer on supplemental oxygen  who presents for evaluation of Positive FOBT   Patient is accompanied by son-in-law bedside.  Patient is not not giving much history and history is obtained from son-in-law.  They are unsure if there is any blood in stool patient currently denies any pain shortness of breath or blurry vision.  Nursing home chart without any mention of overt bleed  Last GNF:[facility]  Last Colonoscopy:2017 One 3 mm polyp in the cecum. Biopsied. - Diverticulosis at the hepatic flexure and in the ascending colon. - Internal hemorrhoids.  1 TA  FHx: Family history significant for CRC in maternal grandfather around 68 years old.   Past Medical History: Past Medical History:  Diagnosis Date   Arthritis    Hypertension    Hyperthyroidism    lung ca 11/2020    Past Surgical History: Past Surgical History:  Procedure Laterality Date   COLONOSCOPY N/A 09/02/2016   Procedure: COLONOSCOPY;  Surgeon: [name], MD;  [facility];  Service: Endoscopy;  Laterality: N/A;  830   NO PAST SURGERIES     POLYPECTOMY  09/02/2016   Procedure: POLYPECTOMY;  Surgeon: [name], MD;  [facility];  Service: Endoscopy;;    Family History: Family History  Problem Relation Age of Onset   Colon cancer Other     Social History: Social History   Tobacco Use  Smoking Status Former   Current packs/day: 0.00   Average packs/day: 1.5 packs/day for 40.0 years (60.0 ttl pk-yrs)   Types: Cigarettes   Start date: 17   Quit date:  2000   Years since quitting: 24.9  Smokeless Tobacco Never   Social History   Substance and Sexual Activity  Alcohol Use No   Social History   Substance and Sexual Activity  Drug Use No    Allergies: No Known Allergies  Medications: Current Outpatient Medications  Medication Sig Dispense Refill   amLODipine (NORVASC) 10 MG tablet Take 10 mg by mouth daily as needed.     calcium carbonate (OSCAL) 1500 (600 Ca) MG TABS tablet Take 500 mg by mouth daily with breakfast. For calcium supplement     ferrous sulfate 325 (65 FE) MG EC tablet Take 325 mg by mouth 3 (three) times a week.     fluticasone-salmeterol (ADVAIR) 250-50 MCG/ACT AEPB Inhale 1 puff into the lungs in the morning and at bedtime.     furosemide (LASIX) 40 MG tablet Take 1 tablet (40 mg total) by mouth daily. 30 tablet 0   gabapentin (NEURONTIN) 250 MG/5ML solution Take 3 mLs (150 mg total) by mouth at bedtime. 90 mL 2   ibuprofen (ADVIL) 200 MG tablet Take 200 mg by mouth every 6 (six) hours as needed.     Ipratropium-Albuterol (COMBIVENT RESPIMAT) 20-100 MCG/ACT AERS respimat Inhale 1 puff into the lungs every 6 (six) hours as needed. 4 g 1   labetalol (NORMODYNE) 200 MG tablet Take 200 mg by mouth 2 (two) times daily.     levothyroxine (SYNTHROID) 150 MCG tablet Take 150 mcg by mouth daily.  Protein (PROSOURCE PO) Take 30 mLs by mouth 3 (three) times daily.     sertraline (ZOLOFT) 50 MG tablet Take 50 mg by mouth daily.     tadalafil (CIALIS) 5 MG tablet Take 5 mg by mouth daily.     tamsulosin (FLOMAX) 0.4 MG CAPS capsule Take 0.4 mg by mouth daily.     No current facility-administered medications for this visit.    Review of Systems: GENERAL: negative for malaise, night sweats HEENT: No changes in hearing or vision, no nose bleeds or other nasal problems. NECK: Negative for lumps, goiter, pain and significant neck swelling RESPIRATORY: Negative for cough, wheezing CARDIOVASCULAR: Negative for chest pain,  leg swelling, palpitations, orthopnea GI: SEE HPI MUSCULOSKELETAL: Negative for joint pain or swelling, back pain, and muscle pain. SKIN: Negative for lesions, rash HEMATOLOGY Negative for prolonged bleeding, bruising easily, and swollen nodes. ENDOCRINE: Negative for cold or heat intolerance, polyuria, polydipsia and goiter. NEURO: negative for tremor, gait imbalance, syncope and seizures. The remainder of the review of systems is noncontributory.   Physical Exam: BP (!) 84/46 Comment: manuel bp  Pulse 61   Temp (!) 97.3 F (36.3 C) (Temporal)   Ht 6\' 2"  (1.88 m)   Wt 142 lb (64.4 kg)   BMI 18.23 kg/m   GENERAL: NAD on supplemental oxygen  HEENT: Head is normocephalic and atraumatic. EOMI are intact. Mouth is well hydrated and without lesions. NECK: Supple. No masses LUNGS: Clear to auscultation. No presence of rhonchi/wheezing/rales. Adequate chest expansion HEART: RRR, normal s1 and s2. ABDOMEN: Soft, nontender, no guarding, no peritoneal signs, and nondistended. BS +. No masses.  Imaging/Labs: as above     Latest Ref Rng & Units 08/12/2023   11:12 AM 08/02/2023    6:30 AM 07/26/2023    5:00 AM  CBC  WBC 4.0 - 10.5 K/uL 3.7   3.6   Hemoglobin 13.0 - 17.0 g/dL 9.1  8.3  8.3   Hematocrit 39.0 - 52.0 % 28.2  28.2  26.7   Platelets 150 - 400 K/uL 181   178    Lab Results  Component Value Date   IRON 33 (L) 07/26/2023   TIBC 201 (L) 07/26/2023   FERRITIN 164 07/08/2023    I personally reviewed and interpreted the available labs, imaging and endoscopic files.  04/2023  . No interval change or evidence of progressive disease. 2. 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 Chronic destruction of a portion of the third right rib. 3. Similar borderline mediastinal adenopathy, favored to be reactive. 4. Chronic bladder wall thickening likely represents outlet obstruction. 5. Incidental findings, including: Aortic atherosclerosis   Patient appears to have chronic  anemia with most recent blood work 08/12/2023 hemoglobin 9.1 MCV 96 platelet 181  Impression and Plan:  MAKARIOS VLIET is a 89 y.o. male with African-American male recently diagnosed with stage IVA (T4, N3, M1b) non-small cell lung cancer on supplemental oxygen  who presents for evaluation of Positive FOBT . Patient found to be hypotensive in the clinic and being sent to ED  #Hypotension  Patient blood pressure checked multiple times yesterday in the nursing home with systolic blood pressure ranging from 136-145 and diastolic 61-78  Today in the clinic on multiple repeat BP was 75/46 and on manual repeat 88/46. This is the lowest blood pressure recorded from previous clinic visits Patient is denying any chest pain shortness of breath or blurry vision.  Given severity of hypotension I discussed with son-in-law a visit to  the ER for further management and resuscitation  #Positive FOBT  #Anemia  Patient appears to have chronic anemia with most recent blood work 08/12/2023 hemoglobin 9.1 MCV 96 platelet 181  FOBT done at PCP office is positive  No hematochezia or melena is mention in the nursing home records.  Patient is not giving much history today  I had a thorough discussion with the patient's son-in-law at bedside Newt Minion) regarding positive FOBT which may mean hemorrhoidal bleed, angiectasia but given advanced age malignancy cannot be excluded.  Although patient had a high-quality colonoscopy in 2017 which is somewhat reassuring  As discussed with patient son-in-law at bedside Newt Minion) that without colonoscopy I cannot exclude malignancy.  Had a thorough risks benefit and limitations discussion regarding diagnostic colonoscopy.  Given patient advanced age, stage IV lung cancer with limited pulmonary reserve on continuous supplemental oxygen patient is high risk for anesthesia and procedure.  Patient's son-in-law Newt Minion) would rather defer any elective procedure at  this time given patient's advanced age and comorbidities and would only pursue colonoscopy if there is any large amount of overt GI bleeding  Any pain ER has been contacted for transfer and  further management regarding hypertension  All questions were answered.      Vista Lawman, MD Gastroenterology and Hepatology [facility] Gastroenterology   This chart has been completed using [facility] Dictation software, and while attempts have been made to ensure accuracy , certain words and phrases may not be transcribed as intended

## 2023-08-21 ENCOUNTER — Encounter (HOSPITAL_COMMUNITY): Admission: AD | Admit: 2023-08-21 | Payer: Medicare HMO | Source: Skilled Nursing Facility | Admitting: Adult Health

## 2023-08-21 ENCOUNTER — Telehealth: Payer: Self-pay | Admitting: Orthopedic Surgery

## 2023-08-21 ENCOUNTER — Other Ambulatory Visit (HOSPITAL_COMMUNITY): Payer: Self-pay | Admitting: Adult Health

## 2023-08-21 DIAGNOSIS — Q791 Other congenital malformations of diaphragm: Secondary | ICD-10-CM | POA: Diagnosis not present

## 2023-08-21 DIAGNOSIS — R918 Other nonspecific abnormal finding of lung field: Secondary | ICD-10-CM | POA: Diagnosis not present

## 2023-08-21 NOTE — Telephone Encounter (Signed)
[facility] nursing calls to report fever 102 and HR 116.  H/o lung cancer. He was seen in the ED yesterday for dehydration and diagnosed with acute kidney injury. BUN/creat 74/3.13. Nursing gave tylenol 650 mg po once 15 minutes ago. Orders for CXR, UA/culture given. Advised to give Rocephin 1g IM once and recheck temperature in 1 hour.

## 2023-08-23 ENCOUNTER — Non-Acute Institutional Stay (SKILLED_NURSING_FACILITY): Payer: Medicare HMO | Admitting: Adult Health

## 2023-08-23 ENCOUNTER — Encounter: Payer: Self-pay | Admitting: Adult Health

## 2023-08-23 DIAGNOSIS — J441 Chronic obstructive pulmonary disease with (acute) exacerbation: Secondary | ICD-10-CM | POA: Diagnosis not present

## 2023-08-23 DIAGNOSIS — J189 Pneumonia, unspecified organism: Secondary | ICD-10-CM

## 2023-08-23 NOTE — Progress Notes (Unsigned)
Location:  [facility] Room Number: 159 Place of Service:  SNF (31)   CODE STATUS: full   No Known Allergies  Chief Complaint  Patient presents with   Acute Visit    Follow up ED visit     HPI:  He had been taken to the ED from the GI clinic due to low blood pressure reading. He was found to have acute kidney injury and was given IVF. He then returned to the facility. He developed a T max of 102.5.  A chest x-ray demonstrated right base infiltrate; the on call provider started him on rocephin until seen by myself. He does have a loose non-productive cough. He does have some fatigue present.   Past Medical History:  Diagnosis Date   Arthritis    Hypertension    Hyperthyroidism    lung ca 11/2020    Past Surgical History:  Procedure Laterality Date   COLONOSCOPY N/A 09/02/2016   Procedure: COLONOSCOPY;  Surgeon: [name], MD;  [facility];  Service: Endoscopy;  Laterality: N/A;  830   NO PAST SURGERIES     POLYPECTOMY  09/02/2016   Procedure: POLYPECTOMY;  Surgeon: [name], MD;  [facility];  Service: Endoscopy;;    Social History   Socioeconomic History   Marital status: Divorced    Spouse name: Not on file   Number of children: Not on file   Years of education: Not on file   Highest education level: Not on file  Occupational History   Not on file  Tobacco Use   Smoking status: Former    Current packs/day: 0.00    Average packs/day: 1.5 packs/day for 40.0 years (60.0 ttl pk-yrs)    Types: Cigarettes    Start date: 89    Quit date: 2000    Years since quitting: 24.9   Smokeless tobacco: Never  Vaping Use   Vaping status: Never Used  Substance and Sexual Activity   Alcohol use: No   Drug use: No   Sexual activity: Not on file  Other Topics Concern   Not on file  Social History Narrative   Not on file   Social Determinants of Health   Financial Resource Strain: Not on file  Food  Insecurity: No Food Insecurity (07/08/2023)   Hunger Vital Sign    Worried About Running Out of Food in the Last Year: Never true    Ran Out of Food in the Last Year: Never true  Transportation Needs: No Transportation Needs (07/08/2023)   PRAPARE - Administrator, Civil Service (Medical): No    Lack of Transportation (Non-Medical): No  Physical Activity: Not on file  Stress: Not on file  Social Connections: Not on file  Intimate Partner Violence: Not At Risk (07/08/2023)   Humiliation, Afraid, Rape, and Kick questionnaire    Fear of Current or Ex-Partner: No    Emotionally Abused: No    Physically Abused: No    Sexually Abused: No   Family History  Problem Relation Age of Onset   Colon cancer Other       VITAL SIGNS BP (!) 140/60   Pulse 74   Temp 98.7 F (37.1 C)   Resp 20   Ht 6\' 2"  (1.88 m)   Wt 144 lb (65.3 kg)   SpO2 95%   BMI 18.49 kg/m   Outpatient Encounter Medications as of 08/23/2023  Medication Sig   amLODipine (NORVASC)  10 MG tablet Take 10 mg by mouth daily as needed.   calcium carbonate (OSCAL) 1500 (600 Ca) MG TABS tablet Take 500 mg by mouth daily with breakfast. For calcium supplement   ferrous sulfate 325 (65 FE) MG EC tablet Take 325 mg by mouth 3 (three) times a week.   fluticasone-salmeterol (ADVAIR) 250-50 MCG/ACT AEPB Inhale 1 puff into the lungs in the morning and at bedtime.   furosemide (LASIX) 40 MG tablet Take 1 tablet (40 mg total) by mouth daily.   gabapentin (NEURONTIN) 250 MG/5ML solution Take 3 mLs (150 mg total) by mouth at bedtime.   hydrochlorothiazide (MICROZIDE) 12.5 MG capsule Take 12.5 mg by mouth daily.   ibuprofen (ADVIL) 200 MG tablet Take 200 mg by mouth every 6 (six) hours as needed.   Ipratropium-Albuterol (COMBIVENT RESPIMAT) 20-100 MCG/ACT AERS respimat Inhale 1 puff into the lungs every 6 (six) hours as needed. (Patient taking differently: Inhale 1 puff into the lungs every 6 (six) hours as needed for wheezing  or shortness of breath.)   labetalol (NORMODYNE) 200 MG tablet Take 200 mg by mouth 2 (two) times daily.   levothyroxine (SYNTHROID) 150 MCG tablet Take 150 mcg by mouth daily.   Protein (PROSOURCE PO) Take 30 mLs by mouth 3 (three) times daily.   sertraline (ZOLOFT) 50 MG tablet Take 50 mg by mouth daily.   tadalafil (CIALIS) 5 MG tablet Take 5 mg by mouth daily.   tamsulosin (FLOMAX) 0.4 MG CAPS capsule Take 0.4 mg by mouth daily.   No facility-administered encounter medications on file as of 08/23/2023.     SIGNIFICANT DIAGNOSTIC EXAMS  PREVIOUS   07-07-23: wbc 5.3; hgb 10.2; hct 32.9; mcv 95.9 plt 167; glucose 98; bun 26; creat 2.04; k+ 3.9; na++ 137; ca 8.0; gfr 31; protein 7.3 albumin 2.7 mag 1.9 07-08-23: tsh 9.279 vitamin B12: 479; folate 7.5; iron 21; tibc 168 07-15-23: wbc 3.9; hgb 8.3; hct 27.1; mcv 98.2 plt 181; glucose 105; bun 39; creat 2.23; k+ 4.3; na++ 137; ca 7.7; gfr 28; mag 1.8  07-20-23: wbc 4.5; hgb 8.1; hct 26.4; mcv 100.4 plt 175; glucose 93; bun 45; creat 2.08; k+ 4.3; na++ 138; ca 7.5; gfr 30 protein 5.5 albumin 2.1; tsh 6.070  TODAY  07-26-23: wbc 3.6; hgb 8.3; hct 26.7; mcv 99.6 plt 178; glucose 86; bun 50; creat 2.18; k+ 4.8; na++ 139; ca 7.8; gfr 29; protein 6.0 albumin 2.2 iron 33 tibc 201 08-20-23: wbc 5.0; hgb 9.9; hct 31.9; mcv 97.3 plt 210; glucose 94; bun 74; creat 3.13; k+ 4.6; na++ 132; ca 8.3; gfr 19; protein 7.5 albumin 2.7   Review of Systems  Constitutional:  Positive for malaise/fatigue.  Respiratory:  Positive for cough and sputum production. Negative for shortness of breath.   Cardiovascular:  Negative for chest pain, palpitations and leg swelling.  Gastrointestinal:  Negative for abdominal pain, constipation and heartburn.  Musculoskeletal:  Negative for back pain, joint pain and myalgias.  Skin: Negative.   Neurological:  Negative for dizziness.  Psychiatric/Behavioral:  The patient is not nervous/anxious.     Physical  Exam Constitutional:      General: He is not in acute distress.    Appearance: He is well-developed. He is not diaphoretic.  Neck:     Thyroid: No thyromegaly.  Cardiovascular:     Rate and Rhythm: Normal rate and regular rhythm.     Pulses: Normal pulses.     Heart sounds: Normal heart sounds.  Pulmonary:  Effort: Pulmonary effort is normal. No respiratory distress.     Breath sounds: Wheezing, rhonchi and rales present.  Abdominal:     General: Bowel sounds are normal. There is no distension.     Palpations: Abdomen is soft.     Tenderness: There is no abdominal tenderness.  Genitourinary:    Comments: foley Musculoskeletal:        General: Normal range of motion.     Cervical back: Neck supple.     Right lower leg: Edema present.     Left lower leg: Edema present.  Lymphadenopathy:     Cervical: No cervical adenopathy.  Skin:    General: Skin is warm and dry.  Neurological:     Mental Status: He is alert. Mental status is at baseline.  Psychiatric:        Mood and Affect: Mood normal.     ASSESSMENT/ PLAN:  TODAY  HCAP (healthcare associated pneumonia) COPD exacerbation  Will begin Z-pac today Will begin spiriva respmat 1.25 mcg 2 puffs daily  Will hold blood pressure medications for systolic <130    Synthia Innocent NP [facility] Adult Medicine   call [phone]

## 2023-08-24 ENCOUNTER — Non-Acute Institutional Stay (SKILLED_NURSING_FACILITY): Payer: Medicare HMO | Admitting: Adult Health

## 2023-08-24 ENCOUNTER — Encounter: Payer: Self-pay | Admitting: Adult Health

## 2023-08-24 ENCOUNTER — Other Ambulatory Visit (HOSPITAL_COMMUNITY)
Admission: RE | Admit: 2023-08-24 | Discharge: 2023-08-24 | Disposition: A | Discharge status: Home / Self Care | Payer: Medicare HMO | Source: Skilled Nursing Facility | Attending: Adult Health | Admitting: Adult Health

## 2023-08-24 DIAGNOSIS — I110 Hypertensive heart disease with heart failure: Secondary | ICD-10-CM | POA: Diagnosis not present

## 2023-08-24 DIAGNOSIS — J441 Chronic obstructive pulmonary disease with (acute) exacerbation: Secondary | ICD-10-CM

## 2023-08-24 DIAGNOSIS — I129 Hypertensive chronic kidney disease with stage 1 through stage 4 chronic kidney disease, or unspecified chronic kidney disease: Secondary | ICD-10-CM

## 2023-08-24 DIAGNOSIS — N184 Chronic kidney disease, stage 4 (severe): Secondary | ICD-10-CM

## 2023-08-24 DIAGNOSIS — J189 Pneumonia, unspecified organism: Secondary | ICD-10-CM | POA: Insufficient documentation

## 2023-08-24 DIAGNOSIS — N179 Acute kidney failure, unspecified: Secondary | ICD-10-CM | POA: Insufficient documentation

## 2023-08-24 LAB — BASIC METABOLIC PANEL
Anion gap: 7 (ref 5–15)
BUN: 68 mg/dL — ABNORMAL HIGH (ref 8–23)
CO2: 25 mmol/L (ref 22–32)
Calcium: 7.8 mg/dL — ABNORMAL LOW (ref 8.9–10.3)
Chloride: 104 mmol/L (ref 98–111)
Creatinine, Ser: 2.67 mg/dL — ABNORMAL HIGH (ref 0.61–1.24)
GFR, Estimated: 22 mL/min — ABNORMAL LOW (ref >60)
Glucose, Bld: 111 mg/dL — ABNORMAL HIGH (ref 70–99)
Potassium: 4.1 mmol/L (ref 3.5–5.1)
Sodium: 136 mmol/L (ref 135–145)

## 2023-08-24 NOTE — Progress Notes (Unsigned)
Location:  [facility] Room Number: 160 Place of Service:  SNF (31)   CODE STATUS: full   No Known Allergies  Chief Complaint  Patient presents with   Medical Management of Chronic Issues         Benign hypertension with coincident congestive heart failure   benign hypertension with chronic kidney disease stage IV:   COPD exacerbation:     HPI:  He is a 89 year old resident of this facility being seen for the management of his chronic illnesses:   Benign hypertension with coincident congestive heart failure   benign hypertension with chronic kidney disease stage IV:   COPD exacerbation. He has been seen in the ED. Today his renal function is not yet back to baseline. He will benefit from one more liter of saline. He tells me that he is feeling a little bit better today.   Past Medical History:  Diagnosis Date   Arthritis    Hypertension    Hyperthyroidism    lung ca 11/2020    Past Surgical History:  Procedure Laterality Date   COLONOSCOPY N/A 09/02/2016   Procedure: COLONOSCOPY;  Surgeon: [name], MD;  [facility];  Service: Endoscopy;  Laterality: N/A;  830   NO PAST SURGERIES     POLYPECTOMY  09/02/2016   Procedure: POLYPECTOMY;  Surgeon: [name], MD;  [facility];  Service: Endoscopy;;    Social History   Socioeconomic History   Marital status: Divorced    Spouse name: Not on file   Number of children: Not on file   Years of education: Not on file   Highest education level: Not on file  Occupational History   Not on file  Tobacco Use   Smoking status: Former    Current packs/day: 0.00    Average packs/day: 1.5 packs/day for 40.0 years (60.0 ttl pk-yrs)    Types: Cigarettes    Start date: 53    Quit date: 2000    Years since quitting: 24.9   Smokeless tobacco: Never  Vaping Use   Vaping status: Never Used  Substance and Sexual Activity   Alcohol use: No   Drug use: No   Sexual activity:  Not on file  Other Topics Concern   Not on file  Social History Narrative   Not on file   Social Determinants of Health   Financial Resource Strain: Not on file  Food Insecurity: No Food Insecurity (07/08/2023)   Hunger Vital Sign    Worried About Running Out of Food in the Last Year: Never true    Ran Out of Food in the Last Year: Never true  Transportation Needs: No Transportation Needs (07/08/2023)   PRAPARE - Administrator, Civil Service (Medical): No    Lack of Transportation (Non-Medical): No  Physical Activity: Not on file  Stress: Not on file  Social Connections: Not on file  Intimate Partner Violence: Not At Risk (07/08/2023)   Humiliation, Afraid, Rape, and Kick questionnaire    Fear of Current or Ex-Partner: No    Emotionally Abused: No    Physically Abused: No    Sexually Abused: No   Family History  Problem Relation Age of Onset   Colon cancer Other       VITAL SIGNS BP 138/78   Pulse 90   Temp 97.8 F (36.6 C)   Resp 18   Ht 6\' 2"  (1.88 m)  Wt 144 lb (65.3 kg)   SpO2 95%   BMI 18.49 kg/m   Outpatient Encounter Medications as of 08/24/2023  Medication Sig   amLODipine (NORVASC) 10 MG tablet Take 10 mg by mouth daily as needed.   azithromycin (ZITHROMAX) 250 MG tablet Take 250 mg by mouth daily.   calcium carbonate (OSCAL) 1500 (600 Ca) MG TABS tablet Take 500 mg by mouth daily with breakfast. For calcium supplement   ferrous sulfate 325 (65 FE) MG EC tablet Take 325 mg by mouth 3 (three) times a week.   fluticasone-salmeterol (ADVAIR) 250-50 MCG/ACT AEPB Inhale 1 puff into the lungs in the morning and at bedtime.   furosemide (LASIX) 40 MG tablet Take 1 tablet (40 mg total) by mouth daily.   gabapentin (NEURONTIN) 250 MG/5ML solution Take 3 mLs (150 mg total) by mouth at bedtime.   hydrochlorothiazide (MICROZIDE) 12.5 MG capsule Take 12.5 mg by mouth daily.   ibuprofen (ADVIL) 200 MG tablet Take 200 mg by mouth every 6 (six) hours as  needed.   Ipratropium-Albuterol (COMBIVENT RESPIMAT) 20-100 MCG/ACT AERS respimat Inhale 1 puff into the lungs every 6 (six) hours as needed.   labetalol (NORMODYNE) 200 MG tablet Take 200 mg by mouth 2 (two) times daily.   levothyroxine (SYNTHROID) 150 MCG tablet Take 150 mcg by mouth daily.   Protein (PROSOURCE PO) Take 30 mLs by mouth 3 (three) times daily.   sertraline (ZOLOFT) 50 MG tablet Take 50 mg by mouth daily.   tadalafil (CIALIS) 5 MG tablet Take 5 mg by mouth daily.   tamsulosin (FLOMAX) 0.4 MG CAPS capsule Take 0.4 mg by mouth daily.   Tiotropium Bromide Monohydrate (SPIRIVA RESPIMAT) 1.25 MCG/ACT AERS Inhale 2 puffs into the lungs daily at 2 PM.   No facility-administered encounter medications on file as of 08/24/2023.     SIGNIFICANT DIAGNOSTIC EXAMS  PREVIOUS   07-07-23: wbc 5.3; hgb 10.2; hct 32.9; mcv 95.9 plt 167; glucose 98; bun 26; creat 2.04; k+ 3.9; na++ 137; ca 8.0; gfr 31; protein 7.3 albumin 2.7 mag 1.9 07-08-23: tsh 9.279 vitamin B12: 479; folate 7.5; iron 21; tibc 168 07-15-23: wbc 3.9; hgb 8.3; hct 27.1; mcv 98.2 plt 181; glucose 105; bun 39; creat 2.23; k+ 4.3; na++ 137; ca 7.7; gfr 28; mag 1.8  07-20-23: wbc 4.5; hgb 8.1; hct 26.4; mcv 100.4 plt 175; glucose 93; bun 45; creat 2.08; k+ 4.3; na++ 138; ca 7.5; gfr 30 protein 5.5 albumin 2.1; tsh 6.070  TODAY  07-26-23: wbc 3.6; hgb 8.3; hct 26.7; mcv 99.6 plt 178; glucose 86; bun 50; creat 2.18; k+ 4.8; na++ 139; ca 7.8; gfr 29; protein 6.0 albumin 2.2 iron 33 tibc 201 08-20-23: wbc 5.0; hgb 9.9; hct 31.9; mcv 97.3 plt 210; glucose 94; bun 74; creat 3.13; k+ 4.6; na++ 132; ca 8.3; gfr 19; protein 7.5 albumin 2.7  08-24-23: glucose 111; bun 68; creat 2.67; k+ 4.1; na++ 136; ca 7.8; gfr 22  Review of Systems  Constitutional:  Negative for malaise/fatigue.  Respiratory:  Positive for cough, sputum production and shortness of breath.   Cardiovascular:  Negative for chest pain, palpitations and leg swelling.   Gastrointestinal:  Negative for abdominal pain, constipation and heartburn.  Musculoskeletal:  Negative for back pain, joint pain and myalgias.  Skin: Negative.   Neurological:  Negative for dizziness.  Psychiatric/Behavioral:  The patient is not nervous/anxious.     Physical Exam Constitutional:      General: He is not in acute  distress.    Appearance: He is well-developed. He is not diaphoretic.  Neck:     Thyroid: No thyromegaly.  Cardiovascular:     Rate and Rhythm: Normal rate and regular rhythm.     Pulses: Normal pulses.     Heart sounds: Normal heart sounds.  Pulmonary:     Effort: Pulmonary effort is normal. No respiratory distress.     Breath sounds: Rhonchi and rales present.  Abdominal:     General: Bowel sounds are normal. There is no distension.     Palpations: Abdomen is soft.     Tenderness: There is no abdominal tenderness.  Genitourinary:    Comments: foley Musculoskeletal:        General: Normal range of motion.     Cervical back: Neck supple.     Right lower leg: Edema present.     Left lower leg: Edema present.  Lymphadenopathy:     Cervical: No cervical adenopathy.  Skin:    General: Skin is warm and dry.  Neurological:     Mental Status: He is alert. Mental status is at baseline.  Psychiatric:        Mood and Affect: Mood normal.       ASSESSMENT/ PLAN:   TODAY  1. Benign hypertension with coincident congestive heart failure/benign hypertension with chronic kidney disease stage IV: b/p 138/78 will continue norvasc 10 mg daily and labetolol 200 mg twice daily he did receive IVF in the ED; his renal failure is not back at baseline will repeat IVF 1 liter NS at 75 cc per hour and will repeat labs.   2.  COPD exacerbation: will continue breo 100-25 mcg 1 puff daily; combivent 20/100 mcg every 6 hours as needed; spiriva respmit 1.25 mcg 2 puffs daily   PREVIOUS   3. Primary cancer right upper lobe: stage IVA  4. Acquired hypothyroidism: tsh  9.279 will continue synthroid 150 mcg daily   5. Benign prostatic hyperplasia without symptoms: will continue flomax 0.4 mg daily and cializ 5 mg daily   6. Anemia of chronic disease: hgb 8.3 iron 21; will continue iron daily   7. Chronic pain syndrome: will continue voltaren gel 2 gm twice daily; gabapentin 150 mg nightly   8. Severe protein calorie malnutrition: albumin 2.7 will continue prosource 30 mL   9.  chronic diastolic congestive heart failure: peripheral edema: will continue laisx 40 mg daily    Synthia Innocent NP [facility] Adult Medicine   call [phone]

## 2023-08-25 ENCOUNTER — Other Ambulatory Visit (HOSPITAL_COMMUNITY)
Admission: RE | Admit: 2023-08-25 | Discharge: 2023-08-25 | Disposition: A | Discharge status: Home / Self Care | Payer: Medicare HMO | Source: Skilled Nursing Facility | Attending: Adult Health | Admitting: Adult Health

## 2023-08-25 ENCOUNTER — Telehealth: Payer: Self-pay | Admitting: Physician Assistant

## 2023-08-25 DIAGNOSIS — N184 Chronic kidney disease, stage 4 (severe): Secondary | ICD-10-CM | POA: Insufficient documentation

## 2023-08-25 LAB — BASIC METABOLIC PANEL
Anion gap: 6 (ref 5–15)
BUN: 62 mg/dL — ABNORMAL HIGH (ref 8–23)
CO2: 24 mmol/L (ref 22–32)
Calcium: 7.6 mg/dL — ABNORMAL LOW (ref 8.9–10.3)
Chloride: 104 mmol/L (ref 98–111)
Creatinine, Ser: 2.54 mg/dL — ABNORMAL HIGH (ref 0.61–1.24)
GFR, Estimated: 24 mL/min — ABNORMAL LOW (ref >60)
Glucose, Bld: 90 mg/dL (ref 70–99)
Potassium: 4.1 mmol/L (ref 3.5–5.1)
Sodium: 134 mmol/L — ABNORMAL LOW (ref 135–145)

## 2023-08-25 NOTE — Telephone Encounter (Signed)
Patient's daughter is aware of scheduled appointment times/dates for follow up appointment

## 2023-08-31 ENCOUNTER — Encounter (HOSPITAL_COMMUNITY)
Admission: RE | Admit: 2023-08-31 | Discharge: 2023-08-31 | Disposition: A | Discharge status: Home / Self Care | Payer: Medicare HMO | Source: Ambulatory Visit | Attending: Internal Medicine | Admitting: Internal Medicine

## 2023-08-31 DIAGNOSIS — C3490 Malignant neoplasm of unspecified part of unspecified bronchus or lung: Secondary | ICD-10-CM | POA: Diagnosis not present

## 2023-08-31 DIAGNOSIS — C3411 Malignant neoplasm of upper lobe, right bronchus or lung: Secondary | ICD-10-CM | POA: Diagnosis not present

## 2023-08-31 LAB — GLUCOSE, CAPILLARY: Glucose-Capillary: 101 mg/dL — ABNORMAL HIGH (ref 70–99)

## 2023-08-31 MED ORDER — FLUDEOXYGLUCOSE F - 18 (FDG) INJECTION
7.2000 | Freq: Once | INTRAVENOUS | Status: AC
  Administered 2023-08-31: 7.13 via INTRAVENOUS

## 2023-09-01 ENCOUNTER — Non-Acute Institutional Stay (SKILLED_NURSING_FACILITY): Payer: Medicare HMO | Admitting: Adult Health

## 2023-09-01 ENCOUNTER — Encounter: Payer: Self-pay | Admitting: Adult Health

## 2023-09-01 ENCOUNTER — Other Ambulatory Visit: Payer: Self-pay | Admitting: Adult Health

## 2023-09-01 DIAGNOSIS — J441 Chronic obstructive pulmonary disease with (acute) exacerbation: Secondary | ICD-10-CM | POA: Diagnosis not present

## 2023-09-01 DIAGNOSIS — C3411 Malignant neoplasm of upper lobe, right bronchus or lung: Secondary | ICD-10-CM

## 2023-09-01 DIAGNOSIS — I5033 Acute on chronic diastolic (congestive) heart failure: Secondary | ICD-10-CM | POA: Diagnosis not present

## 2023-09-01 DIAGNOSIS — N184 Chronic kidney disease, stage 4 (severe): Secondary | ICD-10-CM

## 2023-09-01 MED ORDER — TAMSULOSIN HCL 0.4 MG PO CAPS: 0.4000 mg | ORAL_CAPSULE | Freq: Every day | ORAL | 0 refills | Status: DC

## 2023-09-01 MED ORDER — SPIRIVA RESPIMAT 1.25 MCG/ACT IN AERS: 2.0000 | INHALATION_SPRAY | Freq: Every day | RESPIRATORY_TRACT | 0 refills | Status: DC

## 2023-09-01 MED ORDER — FUROSEMIDE 40 MG PO TABS: 40.0000 mg | ORAL_TABLET | Freq: Every day | ORAL | 0 refills | Status: DC

## 2023-09-01 MED ORDER — FLUTICASONE-SALMETEROL 250-50 MCG/ACT IN AEPB: 1.0000 | INHALATION_SPRAY | Freq: Two times a day (BID) | RESPIRATORY_TRACT | 0 refills | Status: DC

## 2023-09-01 MED ORDER — COMBIVENT RESPIMAT 20-100 MCG/ACT IN AERS: 1.0000 | INHALATION_SPRAY | Freq: Four times a day (QID) | RESPIRATORY_TRACT | 0 refills | Status: DC | PRN

## 2023-09-01 MED ORDER — PROSOURCE PO PACK: 1.0000 | PACK | Freq: Three times a day (TID) | ORAL | 0 refills | Status: DC

## 2023-09-01 MED ORDER — FERROUS SULFATE 325 (65 FE) MG PO TBEC: 325.0000 mg | DELAYED_RELEASE_TABLET | ORAL | 0 refills | Status: DC

## 2023-09-01 MED ORDER — LEVOTHYROXINE SODIUM 150 MCG PO TABS: 150.0000 ug | ORAL_TABLET | Freq: Every day | ORAL | 0 refills | Status: DC

## 2023-09-01 MED ORDER — SERTRALINE HCL 50 MG PO TABS: 50.0000 mg | ORAL_TABLET | Freq: Every day | ORAL | 0 refills | Status: DC

## 2023-09-01 MED ORDER — GABAPENTIN 250 MG/5ML PO SOLN: 150.0000 mg | Freq: Every day | ORAL | 0 refills | Status: DC

## 2023-09-01 MED ORDER — AMLODIPINE BESYLATE 10 MG PO TABS: 10.0000 mg | ORAL_TABLET | Freq: Every day | ORAL | 0 refills | Status: DC

## 2023-09-01 MED ORDER — LABETALOL HCL 200 MG PO TABS: 200.0000 mg | ORAL_TABLET | Freq: Two times a day (BID) | ORAL | 0 refills | Status: DC

## 2023-09-01 MED ORDER — TADALAFIL 5 MG PO TABS: 5.0000 mg | ORAL_TABLET | Freq: Every day | ORAL | 0 refills | Status: DC

## 2023-09-01 NOTE — Progress Notes (Unsigned)
Location:  [facility] Room Number: 160 Place of Service:  SNF (31)   CODE STATUS: full   No Known Allergies  Chief Complaint  Patient presents with   Discharge Note    HPI:  He is being discharged to home with home health for pt/ot/rn. He will need a wheelchair and 02. He will need his prescriptions written and will need to follow up with his medical provider. He had been hospitalized for acute respiratory failure; copd exacerbation; acute CHF. He was admitted to this facility for short term rehab, therapy: ambulates 75 feet with rolling walker and contact guard; upper body supervision; lower body min assist; transfer contact guard; brp: min assist. Is 02 dependent.   Past Medical History:  Diagnosis Date   Arthritis    Hypertension    Hyperthyroidism    lung ca 11/2020    Past Surgical History:  Procedure Laterality Date   COLONOSCOPY N/A 09/02/2016   Procedure: COLONOSCOPY;  Surgeon: [name], MD;  [facility]: [facility];  Service: Endoscopy;  Laterality: N/A;  830   NO PAST SURGERIES     POLYPECTOMY  09/02/2016   Procedure: POLYPECTOMY;  Surgeon: [name], MD;  Location: [facility];  Service: Endoscopy;;    Social History   Socioeconomic History   Marital status: Divorced    Spouse name: Not on file   Number of children: Not on file   Years of education: Not on file   Highest education level: Not on file  Occupational History   Not on file  Tobacco Use   Smoking status: Former    Current packs/day: 0.00    Average packs/day: 1.5 packs/day for 40.0 years (60.0 ttl pk-yrs)    Types: Cigarettes    Start date: 68    Quit date: 2000    Years since quitting: 24.9   Smokeless tobacco: Never  Vaping Use   Vaping status: Never Used  Substance and Sexual Activity   Alcohol use: No   Drug use: No   Sexual activity: Not on file  Other Topics Concern   Not on file  Social History Narrative   Not on file   Social  Determinants of Health   Financial Resource Strain: Not on file  Food Insecurity: No Food Insecurity (07/08/2023)   Hunger Vital Sign    Worried About Running Out of Food in the Last Year: Never true    Ran Out of Food in the Last Year: Never true  Transportation Needs: No Transportation Needs (07/08/2023)   PRAPARE - Administrator, Civil Service (Medical): No    Lack of Transportation (Non-Medical): No  Physical Activity: Not on file  Stress: Not on file  Social Connections: Not on file  Intimate Partner Violence: Not At Risk (07/08/2023)   Humiliation, Afraid, Rape, and Kick questionnaire    Fear of Current or Ex-Partner: No    Emotionally Abused: No    Physically Abused: No    Sexually Abused: No   Family History  Problem Relation Age of Onset   Colon cancer Other       VITAL SIGNS BP 132/66   Pulse 75   Temp 97.8 F (36.6 C)   Resp 20   Ht 6\' 2"  (1.88 m)   Wt 136 lb 9.6 oz (62 kg)   SpO2 97%   BMI 17.54 kg/m   Outpatient Encounter Medications as of 09/01/2023  Medication Sig   amLODipine (NORVASC)  10 MG tablet Take 1 tablet (10 mg total) by mouth daily. Hold for blood pressure reading <130   calcium carbonate (OSCAL) 1500 (600 Ca) MG TABS tablet Take 500 mg by mouth daily with breakfast. For calcium supplement   ferrous sulfate 325 (65 FE) MG EC tablet Take 1 tablet (325 mg total) by mouth 3 (three) times a week.   fluticasone-salmeterol (ADVAIR) 250-50 MCG/ACT AEPB Inhale 1 puff into the lungs in the morning and at bedtime.   furosemide (LASIX) 40 MG tablet Take 1 tablet (40 mg total) by mouth daily.   gabapentin (NEURONTIN) 250 MG/5ML solution Take 3 mLs (150 mg total) by mouth at bedtime.   ibuprofen (ADVIL) 200 MG tablet Take 200 mg by mouth every 6 (six) hours as needed.   Ipratropium-Albuterol (COMBIVENT RESPIMAT) 20-100 MCG/ACT AERS respimat Inhale 1 puff into the lungs every 6 (six) hours as needed.   labetalol (NORMODYNE) 200 MG tablet Take 1  tablet (200 mg total) by mouth 2 (two) times daily. Hold for blood pressure reading <130   levothyroxine (SYNTHROID) 150 MCG tablet Take 1 tablet (150 mcg total) by mouth daily.   Protein (PROSOURCE) PACK Take 1 each by mouth 3 (three) times daily.   sertraline (ZOLOFT) 50 MG tablet Take 1 tablet (50 mg total) by mouth daily.   tadalafil (CIALIS) 5 MG tablet Take 1 tablet (5 mg total) by mouth daily.   tamsulosin (FLOMAX) 0.4 MG CAPS capsule Take 1 capsule (0.4 mg total) by mouth daily.   Tiotropium Bromide Monohydrate (SPIRIVA RESPIMAT) 1.25 MCG/ACT AERS Inhale 2 puffs into the lungs daily at 2 PM.   No facility-administered encounter medications on file as of 09/01/2023.     SIGNIFICANT DIAGNOSTIC EXAMS  PREVIOUS   07-07-23: wbc 5.3; hgb 10.2; hct 32.9; mcv 95.9 plt 167; glucose 98; bun 26; creat 2.04; k+ 3.9; na++ 137; ca 8.0; gfr 31; protein 7.3 albumin 2.7 mag 1.9 07-08-23: tsh 9.279 vitamin B12: 479; folate 7.5; iron 21; tibc 168 07-15-23: wbc 3.9; hgb 8.3; hct 27.1; mcv 98.2 plt 181; glucose 105; bun 39; creat 2.23; k+ 4.3; na++ 137; ca 7.7; gfr 28; mag 1.8  07-20-23: wbc 4.5; hgb 8.1; hct 26.4; mcv 100.4 plt 175; glucose 93; bun 45; creat 2.08; k+ 4.3; na++ 138; ca 7.5; gfr 30 protein 5.5 albumin 2.1; tsh 6.070 07-26-23: wbc 3.6; hgb 8.3; hct 26.7; mcv 99.6 plt 178; glucose 86; bun 50; creat 2.18; k+ 4.8; na++ 139; ca 7.8; gfr 29; protein 6.0 albumin 2.2 iron 33 tibc 201 08-20-23: wbc 5.0; hgb 9.9; hct 31.9; mcv 97.3 plt 210; glucose 94; bun 74; creat 3.13; k+ 4.6; na++ 132; ca 8.3; gfr 19; protein 7.5 albumin 2.7  08-24-23: glucose 111; bun 68; creat 2.67; k+ 4.1; na++ 136; ca 7.8; gfr 22  NO NEW LABS.   Review of Systems  Constitutional:  Negative for malaise/fatigue.  Respiratory:  Negative for cough and shortness of breath.   Cardiovascular:  Negative for chest pain, palpitations and leg swelling.  Gastrointestinal:  Negative for abdominal pain, constipation and heartburn.   Musculoskeletal:  Negative for back pain, joint pain and myalgias.  Skin: Negative.   Neurological:  Negative for dizziness.  Psychiatric/Behavioral:  The patient is not nervous/anxious.     Physical Exam Constitutional:      General: He is not in acute distress.    Appearance: He is well-developed. He is not diaphoretic.  Neck:     Thyroid: No thyromegaly.  Cardiovascular:  Rate and Rhythm: Normal rate and regular rhythm.     Pulses: Normal pulses.     Heart sounds: Normal heart sounds.  Pulmonary:     Effort: Pulmonary effort is normal. No respiratory distress.     Breath sounds: Normal breath sounds.  Abdominal:     General: Bowel sounds are normal. There is no distension.     Palpations: Abdomen is soft.     Tenderness: There is no abdominal tenderness.  Genitourinary:    Comments: foley Musculoskeletal:        General: Normal range of motion.     Cervical back: Neck supple.     Right lower leg: Edema present.     Left lower leg: Edema present.  Lymphadenopathy:     Cervical: No cervical adenopathy.  Skin:    General: Skin is warm and dry.  Neurological:     Mental Status: He is alert. Mental status is at baseline.  Psychiatric:        Mood and Affect: Mood normal.      ASSESSMENT/ PLAN:   Patient is being discharged with the following home health services:  pt/ot/rn: to evaluate and treat as indicated for gait balance strength adl training and medication management   Patient is being discharged with the following durable medical equipment:  wheelchair; 02   Patient has been advised to f/u with their PCP in 1-2 weeks to for a transitions of care visit.  Social services at their facility was responsible for arranging this appointment.  Pt was provided with adequate prescriptions of noncontrolled medications to reach the scheduled appointment .  For controlled substances, a limited supply was provided as appropriate for the individual patient.  If the pt  normally receives these medications from a pain clinic or has a contract with another physician, these medications should be received from that clinic or physician only).    A 30 day supply of his prescription medications have been sent to [facility]  Time spent with patient: 40 minutes: medications; dme; home health   Synthia Innocent NP [facility]   call [phone]

## 2023-09-02 DIAGNOSIS — C3412 Malignant neoplasm of upper lobe, left bronchus or lung: Secondary | ICD-10-CM | POA: Diagnosis not present

## 2023-09-02 DIAGNOSIS — M6281 Muscle weakness (generalized): Secondary | ICD-10-CM | POA: Diagnosis not present

## 2023-09-02 DIAGNOSIS — R2689 Other abnormalities of gait and mobility: Secondary | ICD-10-CM | POA: Diagnosis not present

## 2023-09-02 DIAGNOSIS — J449 Chronic obstructive pulmonary disease, unspecified: Secondary | ICD-10-CM | POA: Diagnosis not present

## 2023-09-02 DIAGNOSIS — J441 Chronic obstructive pulmonary disease with (acute) exacerbation: Secondary | ICD-10-CM | POA: Diagnosis not present

## 2023-09-02 DIAGNOSIS — I110 Hypertensive heart disease with heart failure: Secondary | ICD-10-CM | POA: Diagnosis not present

## 2023-09-02 DIAGNOSIS — I509 Heart failure, unspecified: Secondary | ICD-10-CM | POA: Diagnosis not present

## 2023-09-02 DIAGNOSIS — R531 Weakness: Secondary | ICD-10-CM | POA: Diagnosis not present

## 2023-09-02 DIAGNOSIS — J9601 Acute respiratory failure with hypoxia: Secondary | ICD-10-CM | POA: Diagnosis not present

## 2023-09-02 DIAGNOSIS — N184 Chronic kidney disease, stage 4 (severe): Secondary | ICD-10-CM | POA: Diagnosis not present

## 2023-09-02 DIAGNOSIS — R0902 Hypoxemia: Secondary | ICD-10-CM | POA: Diagnosis not present

## 2023-09-02 DIAGNOSIS — R262 Difficulty in walking, not elsewhere classified: Secondary | ICD-10-CM | POA: Diagnosis not present

## 2023-09-03 DIAGNOSIS — Z9981 Dependence on supplemental oxygen: Secondary | ICD-10-CM | POA: Diagnosis not present

## 2023-09-03 DIAGNOSIS — D509 Iron deficiency anemia, unspecified: Secondary | ICD-10-CM | POA: Diagnosis not present

## 2023-09-03 DIAGNOSIS — Z85118 Personal history of other malignant neoplasm of bronchus and lung: Secondary | ICD-10-CM | POA: Diagnosis not present

## 2023-09-03 DIAGNOSIS — Z87891 Personal history of nicotine dependence: Secondary | ICD-10-CM | POA: Diagnosis not present

## 2023-09-03 DIAGNOSIS — J441 Chronic obstructive pulmonary disease with (acute) exacerbation: Secondary | ICD-10-CM | POA: Diagnosis not present

## 2023-09-03 DIAGNOSIS — G629 Polyneuropathy, unspecified: Secondary | ICD-10-CM | POA: Diagnosis not present

## 2023-09-03 DIAGNOSIS — E039 Hypothyroidism, unspecified: Secondary | ICD-10-CM | POA: Diagnosis not present

## 2023-09-03 DIAGNOSIS — N184 Chronic kidney disease, stage 4 (severe): Secondary | ICD-10-CM | POA: Diagnosis not present

## 2023-09-03 DIAGNOSIS — I5033 Acute on chronic diastolic (congestive) heart failure: Secondary | ICD-10-CM | POA: Diagnosis not present

## 2023-09-03 DIAGNOSIS — Z602 Problems related to living alone: Secondary | ICD-10-CM | POA: Diagnosis not present

## 2023-09-03 DIAGNOSIS — J9601 Acute respiratory failure with hypoxia: Secondary | ICD-10-CM | POA: Diagnosis not present

## 2023-09-03 DIAGNOSIS — I130 Hypertensive heart and chronic kidney disease with heart failure and stage 1 through stage 4 chronic kidney disease, or unspecified chronic kidney disease: Secondary | ICD-10-CM | POA: Diagnosis not present

## 2023-09-03 DIAGNOSIS — E43 Unspecified severe protein-calorie malnutrition: Secondary | ICD-10-CM | POA: Diagnosis not present

## 2023-09-06 DIAGNOSIS — Z85118 Personal history of other malignant neoplasm of bronchus and lung: Secondary | ICD-10-CM | POA: Diagnosis not present

## 2023-09-06 DIAGNOSIS — I130 Hypertensive heart and chronic kidney disease with heart failure and stage 1 through stage 4 chronic kidney disease, or unspecified chronic kidney disease: Secondary | ICD-10-CM | POA: Diagnosis not present

## 2023-09-06 DIAGNOSIS — Z87891 Personal history of nicotine dependence: Secondary | ICD-10-CM | POA: Diagnosis not present

## 2023-09-06 DIAGNOSIS — D509 Iron deficiency anemia, unspecified: Secondary | ICD-10-CM | POA: Diagnosis not present

## 2023-09-06 DIAGNOSIS — E039 Hypothyroidism, unspecified: Secondary | ICD-10-CM | POA: Diagnosis not present

## 2023-09-06 DIAGNOSIS — G629 Polyneuropathy, unspecified: Secondary | ICD-10-CM | POA: Diagnosis not present

## 2023-09-06 DIAGNOSIS — Z602 Problems related to living alone: Secondary | ICD-10-CM | POA: Diagnosis not present

## 2023-09-06 DIAGNOSIS — I5033 Acute on chronic diastolic (congestive) heart failure: Secondary | ICD-10-CM | POA: Diagnosis not present

## 2023-09-06 DIAGNOSIS — J9601 Acute respiratory failure with hypoxia: Secondary | ICD-10-CM | POA: Diagnosis not present

## 2023-09-06 DIAGNOSIS — J441 Chronic obstructive pulmonary disease with (acute) exacerbation: Secondary | ICD-10-CM | POA: Diagnosis not present

## 2023-09-06 DIAGNOSIS — N184 Chronic kidney disease, stage 4 (severe): Secondary | ICD-10-CM | POA: Diagnosis not present

## 2023-09-06 DIAGNOSIS — Z9981 Dependence on supplemental oxygen: Secondary | ICD-10-CM | POA: Diagnosis not present

## 2023-09-06 DIAGNOSIS — E43 Unspecified severe protein-calorie malnutrition: Secondary | ICD-10-CM | POA: Diagnosis not present

## 2023-09-07 DIAGNOSIS — I130 Hypertensive heart and chronic kidney disease with heart failure and stage 1 through stage 4 chronic kidney disease, or unspecified chronic kidney disease: Secondary | ICD-10-CM | POA: Diagnosis not present

## 2023-09-07 DIAGNOSIS — N184 Chronic kidney disease, stage 4 (severe): Secondary | ICD-10-CM | POA: Diagnosis not present

## 2023-09-07 DIAGNOSIS — I5033 Acute on chronic diastolic (congestive) heart failure: Secondary | ICD-10-CM | POA: Diagnosis not present

## 2023-09-07 DIAGNOSIS — J441 Chronic obstructive pulmonary disease with (acute) exacerbation: Secondary | ICD-10-CM | POA: Diagnosis not present

## 2023-09-07 DIAGNOSIS — D509 Iron deficiency anemia, unspecified: Secondary | ICD-10-CM | POA: Diagnosis not present

## 2023-09-07 DIAGNOSIS — E43 Unspecified severe protein-calorie malnutrition: Secondary | ICD-10-CM | POA: Diagnosis not present

## 2023-09-07 DIAGNOSIS — Z87891 Personal history of nicotine dependence: Secondary | ICD-10-CM | POA: Diagnosis not present

## 2023-09-07 DIAGNOSIS — E039 Hypothyroidism, unspecified: Secondary | ICD-10-CM | POA: Diagnosis not present

## 2023-09-07 DIAGNOSIS — Z9981 Dependence on supplemental oxygen: Secondary | ICD-10-CM | POA: Diagnosis not present

## 2023-09-07 DIAGNOSIS — J9601 Acute respiratory failure with hypoxia: Secondary | ICD-10-CM | POA: Diagnosis not present

## 2023-09-07 DIAGNOSIS — Z85118 Personal history of other malignant neoplasm of bronchus and lung: Secondary | ICD-10-CM | POA: Diagnosis not present

## 2023-09-07 DIAGNOSIS — Z602 Problems related to living alone: Secondary | ICD-10-CM | POA: Diagnosis not present

## 2023-09-07 DIAGNOSIS — G629 Polyneuropathy, unspecified: Secondary | ICD-10-CM | POA: Diagnosis not present

## 2023-09-09 DIAGNOSIS — Z9981 Dependence on supplemental oxygen: Secondary | ICD-10-CM | POA: Diagnosis not present

## 2023-09-09 DIAGNOSIS — I130 Hypertensive heart and chronic kidney disease with heart failure and stage 1 through stage 4 chronic kidney disease, or unspecified chronic kidney disease: Secondary | ICD-10-CM | POA: Diagnosis not present

## 2023-09-09 DIAGNOSIS — Z85118 Personal history of other malignant neoplasm of bronchus and lung: Secondary | ICD-10-CM | POA: Diagnosis not present

## 2023-09-09 DIAGNOSIS — J441 Chronic obstructive pulmonary disease with (acute) exacerbation: Secondary | ICD-10-CM | POA: Diagnosis not present

## 2023-09-09 DIAGNOSIS — I5033 Acute on chronic diastolic (congestive) heart failure: Secondary | ICD-10-CM | POA: Diagnosis not present

## 2023-09-09 DIAGNOSIS — E039 Hypothyroidism, unspecified: Secondary | ICD-10-CM | POA: Diagnosis not present

## 2023-09-09 DIAGNOSIS — N184 Chronic kidney disease, stage 4 (severe): Secondary | ICD-10-CM | POA: Diagnosis not present

## 2023-09-09 DIAGNOSIS — E43 Unspecified severe protein-calorie malnutrition: Secondary | ICD-10-CM | POA: Diagnosis not present

## 2023-09-09 DIAGNOSIS — G629 Polyneuropathy, unspecified: Secondary | ICD-10-CM | POA: Diagnosis not present

## 2023-09-09 DIAGNOSIS — Z602 Problems related to living alone: Secondary | ICD-10-CM | POA: Diagnosis not present

## 2023-09-09 DIAGNOSIS — J9601 Acute respiratory failure with hypoxia: Secondary | ICD-10-CM | POA: Diagnosis not present

## 2023-09-09 DIAGNOSIS — D509 Iron deficiency anemia, unspecified: Secondary | ICD-10-CM | POA: Diagnosis not present

## 2023-09-09 DIAGNOSIS — Z87891 Personal history of nicotine dependence: Secondary | ICD-10-CM | POA: Diagnosis not present

## 2023-09-10 DIAGNOSIS — I130 Hypertensive heart and chronic kidney disease with heart failure and stage 1 through stage 4 chronic kidney disease, or unspecified chronic kidney disease: Secondary | ICD-10-CM | POA: Diagnosis not present

## 2023-09-10 DIAGNOSIS — Z87891 Personal history of nicotine dependence: Secondary | ICD-10-CM | POA: Diagnosis not present

## 2023-09-10 DIAGNOSIS — E43 Unspecified severe protein-calorie malnutrition: Secondary | ICD-10-CM | POA: Diagnosis not present

## 2023-09-10 DIAGNOSIS — G629 Polyneuropathy, unspecified: Secondary | ICD-10-CM | POA: Diagnosis not present

## 2023-09-10 DIAGNOSIS — Z85118 Personal history of other malignant neoplasm of bronchus and lung: Secondary | ICD-10-CM | POA: Diagnosis not present

## 2023-09-10 DIAGNOSIS — J441 Chronic obstructive pulmonary disease with (acute) exacerbation: Secondary | ICD-10-CM | POA: Diagnosis not present

## 2023-09-10 DIAGNOSIS — N184 Chronic kidney disease, stage 4 (severe): Secondary | ICD-10-CM | POA: Diagnosis not present

## 2023-09-10 DIAGNOSIS — Z602 Problems related to living alone: Secondary | ICD-10-CM | POA: Diagnosis not present

## 2023-09-10 DIAGNOSIS — D509 Iron deficiency anemia, unspecified: Secondary | ICD-10-CM | POA: Diagnosis not present

## 2023-09-10 DIAGNOSIS — I5033 Acute on chronic diastolic (congestive) heart failure: Secondary | ICD-10-CM | POA: Diagnosis not present

## 2023-09-10 DIAGNOSIS — J9601 Acute respiratory failure with hypoxia: Secondary | ICD-10-CM | POA: Diagnosis not present

## 2023-09-10 DIAGNOSIS — E039 Hypothyroidism, unspecified: Secondary | ICD-10-CM | POA: Diagnosis not present

## 2023-09-10 DIAGNOSIS — Z9981 Dependence on supplemental oxygen: Secondary | ICD-10-CM | POA: Diagnosis not present

## 2023-09-10 NOTE — Progress Notes (Unsigned)
[facility] OFFICE PROGRESS NOTE  [name], MD [address] 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   Biomarker Findings Tumor Mutational Burden - 10 Muts/Mb Microsatellite status - MS-Stable Genomic Findings For a complete list of the genes assayed, please refer to the Appendix. KRAS G12C NFKBIA amplification NKX2-1 amplification RBM10 D641fs*80 7 Disease relevant genes with no reportable alterations: ALK, BRAF, EGFR, ERBB2, MET, RET, ROS1   PDL1: 90%  PRIOR THERAPY:  1) Weekly concurrent chemoradiation with carboplatin for an AUC of 2 and paclitaxel 45 mg/m.  First dose on 12/30/2020.  Status post 6 cycles    2) First-line treatment with immunotherapy with Libtayo (Cempilimab) 350 Mg IV every 3 weeks. First dose on 03/19/21. Status post 32 cycles.  Discontinued due to completing almost 2 years of intended treatment and having worsening CKD.   CURRENT THERAPY: Observation   INTERVAL HISTORY: [name] 89 y.o. male returns to the clinic today for a follow-up visit accompanied by his son-in-law.  In summary, the patient was last seen by Dr. Arbutus Ped on 08/12/2023.  At that point in time, the patient had a restaging CT scan that showed increased soft tissue thickening of the right hilum and mass effect on the right mainstem bronchus and the lower lobe bronchi suggesting mass/adenopathy rather than atelectasis.  Dr. Arbutus Ped recommended a PET scan to further evaluate this.  Otherwise the patient denies any major changes in his health since he was last seen.  He was seen in the ER once on 08/20/2023 for hypertension.  Of note the patient has chronic kidney disease and he is followed  closely by [facility]. They are not sure when his next appointment is.   He denies any fever or night sweats.  He is on supplemental oxygen with 2 L of oxygen. He may get short of breath every now and then with certain activities. He also may get a cough every now and then. He does not take anything for cough. He is wondering what he can take. His PCP is managing his synthroid. His scan incidentally mentioned uptake in his thyroid.    Denies any chest pain or hemoptysis. Denies any nausea, vomiting. He may have mild diarrhea every once in awhile that is self limiting.  Denies any headache or visual changes.  The patient continues to have worsening vitiligo even since completion of immunotherapy.  He is here today for evaluation to review his scan and discussed the next steps in his care.   MEDICAL HISTORY: Past Medical History:  Diagnosis Date   Arthritis    Hypertension    Hyperthyroidism    lung ca 11/2020    ALLERGIES:  has no known allergies.  MEDICATIONS:  Current Outpatient Medications  Medication Sig Dispense Refill   amLODipine (NORVASC) 10 MG tablet Take 1 tablet (10 mg total) by mouth daily. Hold for blood pressure reading <130 30 tablet 0   calcium carbonate (OSCAL) 1500 (600 Ca) MG TABS tablet Take 500 mg by mouth daily with breakfast. For calcium supplement     ferrous sulfate 325 (65 FE) MG EC tablet Take 1 tablet (325 mg total) by mouth 3 (three) times a week. 12 tablet 0   fluticasone-salmeterol (  ADVAIR) 250-50 MCG/ACT AEPB Inhale 1 puff into the lungs in the morning and at bedtime. 60 each 0   furosemide (LASIX) 40 MG tablet Take 1 tablet (40 mg total) by mouth daily. 30 tablet 0   gabapentin (NEURONTIN) 250 MG/5ML solution Take 3 mLs (150 mg total) by mouth at bedtime. 90 mL 0   ibuprofen (ADVIL) 200 MG tablet Take 200 mg by mouth every 6 (six) hours as needed.     Ipratropium-Albuterol (COMBIVENT RESPIMAT) 20-100 MCG/ACT AERS respimat Inhale 1 puff into the  lungs every 6 (six) hours as needed. 4 g 0   labetalol (NORMODYNE) 200 MG tablet Take 1 tablet (200 mg total) by mouth 2 (two) times daily. Hold for blood pressure reading <130 60 tablet 0   levothyroxine (SYNTHROID) 150 MCG tablet Take 1 tablet (150 mcg total) by mouth daily. 30 tablet 0   Protein (PROSOURCE) PACK Take 1 each by mouth 3 (three) times daily. 1 each 0   sertraline (ZOLOFT) 50 MG tablet Take 1 tablet (50 mg total) by mouth daily. 30 tablet 0   tadalafil (CIALIS) 5 MG tablet Take 1 tablet (5 mg total) by mouth daily. 30 tablet 0   tamsulosin (FLOMAX) 0.4 MG CAPS capsule Take 1 capsule (0.4 mg total) by mouth daily. 30 capsule 0   Tiotropium Bromide Monohydrate (SPIRIVA RESPIMAT) 1.25 MCG/ACT AERS Inhale 2 puffs into the lungs daily at 2 PM. 4 g 0   No current facility-administered medications for this visit.    SURGICAL HISTORY:  Past Surgical History:  Procedure Laterality Date   COLONOSCOPY N/A 09/02/2016   Procedure: COLONOSCOPY;  Surgeon: [name], MD;  [facility];  Service: Endoscopy;  Laterality: N/A;  830   NO PAST SURGERIES     POLYPECTOMY  09/02/2016   Procedure: POLYPECTOMY;  Surgeon: [name], MD;  [facility]: [facility];  Service: Endoscopy;;    REVIEW OF SYSTEMS:   Constitutional: Positive for stable fatigue. Negative for appetite change, chills, fever and unexpected weight change.  HENT: Negative for mouth sores, nosebleeds, sore throat and trouble swallowing.  Eyes: Negative for eye problems and icterus.  Respiratory: Positive for stable shortness of breath with exertion and intermittent cough. Negative for hemoptysis and wheezing.  Cardiovascular: Negative for chest pain.  Gastrointestinal: Negative for abdominal pain, constipation, diarrhea (none at this time), nausea and vomiting.  Genitourinary: Negative for bladder incontinence, difficulty urinating, dysuria, frequency and hematuria.   Musculoskeletal: Negative for back pain,  gait problem, neck pain and neck stiffness.  Skin: Positive for hypopigmentation on face/vitiligo .  Neurological: Negative for dizziness, extremity weakness, gait problem, headaches, light-headedness and seizures.  Hematological: Negative for adenopathy. Does not bruise/bleed easily.  Psychiatric/Behavioral: Negative for confusion, depression and sleep disturbance. The patient is not nervous/anxious.    PHYSICAL EXAMINATION:  Blood pressure 105/60, pulse (!) 50, temperature 97.9 F (36.6 C), temperature source Temporal, resp. rate 16, SpO2 96%.  ECOG PERFORMANCE STATUS: 2  Physical Exam  Constitutional: Oriented to person, place, and time and thin appearing male and in no distress.  HENT:  Head: Normocephalic and atraumatic.  Mouth/Throat: Oropharynx is clear and moist. No oropharyngeal exudate.  No evidence of thrush Eyes: Conjunctivae are normal. Right eye exhibits no discharge. Left eye exhibits no discharge. No scleral icterus.  Neck: Normal range of motion. Neck supple.  Cardiovascular: Normal rate, regular rhythm, normal heart sounds and intact distal pulses.   Pulmonary/Chest: Effort normal and breath sounds normal. No  respiratory distress. No wheezes. No rales.  Abdominal: Soft. Bowel sounds are normal. Exhibits no distension and no mass. There is no tenderness.  Musculoskeletal: Normal range of motion. Positive for mild bilateral lower extremity swelling.  Lymphadenopathy:    No cervical adenopathy.  Neurological: Alert and oriented to person, place, and time. Exhibits muscle wasting.  The patient was examined in the wheelchair. Skin: Hypopigmentation on his face and upper torso and hands. Skin is warm and dry. Not diaphoretic. No erythema. No pallor.  Psychiatric: Mood, memory and judgment normal.  Vitals reviewed.  LABORATORY DATA: Lab Results  Component Value Date   WBC 5.0 08/20/2023   HGB 9.9 (L) 08/20/2023   HCT 31.9 (L) 08/20/2023   MCV 97.3 08/20/2023   PLT 210  08/20/2023      Chemistry      Component Value Date/Time   NA 134 (L) 08/25/2023 0600   K 4.1 08/25/2023 0600   CL 104 08/25/2023 0600   CO2 24 08/25/2023 0600   BUN 62 (H) 08/25/2023 0600   CREATININE 2.54 (H) 08/25/2023 0600   CREATININE 2.51 (H) 08/12/2023 1112      Component Value Date/Time   CALCIUM 7.6 (L) 08/25/2023 0600   ALKPHOS 75 08/20/2023 1214   AST 16 08/20/2023 1214   AST 7 (L) 08/12/2023 1112   ALT 18 08/20/2023 1214   ALT 13 08/12/2023 1112   BILITOT 0.4 08/20/2023 1214   BILITOT 0.4 08/12/2023 1112       RADIOGRAPHIC STUDIES:  NM PET Image Restage (PS) Skull Base to Thigh (F-18 FDG) Result Date: 09/13/2023 CLINICAL DATA:  Subsequent treatment strategy for non-small cell lung cancer. EXAM: NUCLEAR MEDICINE PET SKULL BASE TO THIGH TECHNIQUE: 7.1 mCi F-18 FDG was injected intravenously. Full-ring PET imaging was performed from the skull base to thigh after the radiotracer. CT data was obtained and used for attenuation correction and anatomic localization. Fasting blood glucose: 101 mg/dl COMPARISON:  CT chest dated 07/07/2023. CT chest abdomen pelvis dated 04/29/2023. FINDINGS: Mediastinal blood pool activity: SUV max 2.4 Liver activity: SUV max NA NECK: No hypermetabolic lymph nodes in the neck. Diffuse thyroid hypermetabolism, without focal lesion, reference max SUV 12.1. Incidental CT findings: None. CHEST: Radiation changes in the right upper lobe/suprahilar region, without mid lung and metabolism to suggest recurrence. No suspicious pulmonary nodules. No hypermetabolic thoracic lymphadenopathy. Incidental CT findings: Atherosclerotic calcifications of the aortic arch. Mild three-vessel coronary atherosclerosis. ABDOMEN/PELVIS: No abnormal hypermetabolic activity within the liver, pancreas, adrenal glands, or spleen. No hypermetabolic lymph nodes in the abdomen or pelvis. Incidental CT findings: Scattered bilateral renal cysts. Atherosclerotic calcifications  abdominal aorta and branch vessels. Fiducial markers along the prostate. Mildly thick-walled bladder, although underdistended. SKELETON: No focal hypermetabolic activity to suggest skeletal metastasis. Incidental CT findings: Mild degenerative changes of the visualized thoracolumbar spine. IMPRESSION: Radiation changes in the right upper lobe/suprahilar region. No evidence of recurrence or metastatic disease. Fiducial markers along the prostate. Diffuse thyroid hypermetabolism, without focal lesion, correlate for thyroiditis. Electronically Signed   By: Charline Bills M.D.   On: 09/13/2023 02:39     ASSESSMENT/PLAN:  This is a very pleasant 89 year old African-American male diagnosed with a stage IVa (T4, N3, M1 B) non-small cell lung cancer, adenosquamous carcinoma in March 2022 and presented with large mass involving the lateral right upper lobe and right chest wall soft tissue in addition to right hilar, mediastinal and right subpectoral and supraclavicular lymphadenopathy with PD-L1 expression of 90%.   The patient  is status post a course of concurrent chemoradiation with carboplatin for an AUC of 2 and paclitaxel 45 mg per metered square.  He is status post 6 cycles.    The patient had evidence of disease progression following concurrent chemoradiation.  The patient was on immunotherapy with Libtayo IV every 3 weeks due to his PD-L1 expression of 90%.  The patient is status post 32 cycles.  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  He was seen by Dr. Jorja Loa by dermatology. He completed just under the planned 2 years of treatment. This was discontinued a few cycles early due to worsening CKD.   In October 2024, the patient's scan was suggestive of disease recurrence.  Therefore Dr. Arbutus Ped recommended a PET scan.  The patient was seen with Dr. Arbutus Ped today.  Dr. Arbutus Ped personally and independently reviewed the scan and discussed  results with the patient today.  The scan showed radiation changes in the right upper lobe/suprahilar region. No evidence of recurrence or metastatic disease.  Dr. Arbutus Ped recommends he continue on observation with repeat CT scan in 4 months. I will order without contrast due to his CKD.   We will see the patient back about 1 week after his CT scan.   Encouraged him to always have a follow up with his nephrologist due to his CKD. I also recommended he follow up with his PCP about the diffuse thyroid hypermetabolism. The patient is currently on synthroid.   If needed for cough, recommend delsym.   The patient was advised to call immediately if she has any concerning symptoms in the interval. The patient voices understanding of current disease status and treatment options and is in agreement with the current care plan. All questions were answered. The patient knows to call the clinic with any problems, questions or concerns. We can certainly see the patient much sooner if necessary         Orders Placed This Encounter  Procedures   CT CHEST ABDOMEN PELVIS WO CONTRAST    Standing Status:   Future    Expected Date:   01/03/2024    Expiration Date:   09/13/2024    Preferred imaging location?:   [facility]    If indicated for the ordered procedure, I authorize the administration of oral contrast media per Radiology protocol:   Yes    Does the patient have a contrast media/X-ray dye allergy?:   No   CBC with Differential (Cancer Center Only)    Standing Status:   Future    Expected Date:   01/03/2024    Expiration Date:   09/13/2024   CMP (Cancer Center only)    Standing Status:   Future    Expected Date:   01/03/2024    Expiration Date:   09/13/2024     Taequan Stockhausen L Charlyn Vialpando, PA-C 09/14/23  ADDENDUM: Hematology/Oncology Attending: I had a face-to-face encounter with the patient today.  I reviewed his record, lab, scan and recommended his care plan.  This is a very  pleasant 89 years old African-American male with stage IV non-small cell lung cancer diagnosed in March 2022 with positive KRAS G12C mutation and PD-L1 expression of 90%.  The patient initially underwent a course of concurrent chemoradiation for the locally advanced disease followed by 32 cycles of treatment with immunotherapy with Libtayo (Cempilimab) discontinued few months ago.  The patient has been on observation and feeling fine.  He was found on previous CT  scan of the chest to have increased soft tissue thickening of the right hilum with mass effect on the right mainstem bronchus and lower lobe bronchi suspicious for mass/adenopathy versus atelectasis.  I did order a PET scan which was performed recently.  I discussed the PET scan result with the patient and that showed no concerning findings for disease progression or recurrence. I recommended for him to continue on observation with repeat CT scan of the chest in 4 months. The patient was advised to call immediately if he has any other concerning symptoms in the interval. The total time spent in the appointment was 30 minutes. Disclaimer: This note was dictated with voice recognition software. Similar sounding words can inadvertently be transcribed and may be missed upon review. Lajuana Matte, MD

## 2023-09-13 DIAGNOSIS — C3490 Malignant neoplasm of unspecified part of unspecified bronchus or lung: Secondary | ICD-10-CM | POA: Diagnosis not present

## 2023-09-13 DIAGNOSIS — J9601 Acute respiratory failure with hypoxia: Secondary | ICD-10-CM | POA: Diagnosis not present

## 2023-09-13 DIAGNOSIS — Z9981 Dependence on supplemental oxygen: Secondary | ICD-10-CM | POA: Diagnosis not present

## 2023-09-13 DIAGNOSIS — Z602 Problems related to living alone: Secondary | ICD-10-CM | POA: Diagnosis not present

## 2023-09-13 DIAGNOSIS — R6889 Other general symptoms and signs: Secondary | ICD-10-CM | POA: Diagnosis not present

## 2023-09-13 DIAGNOSIS — D509 Iron deficiency anemia, unspecified: Secondary | ICD-10-CM | POA: Diagnosis not present

## 2023-09-13 DIAGNOSIS — N1832 Chronic kidney disease, stage 3b: Secondary | ICD-10-CM | POA: Diagnosis not present

## 2023-09-13 DIAGNOSIS — J441 Chronic obstructive pulmonary disease with (acute) exacerbation: Secondary | ICD-10-CM | POA: Diagnosis not present

## 2023-09-13 DIAGNOSIS — E43 Unspecified severe protein-calorie malnutrition: Secondary | ICD-10-CM | POA: Diagnosis not present

## 2023-09-13 DIAGNOSIS — G629 Polyneuropathy, unspecified: Secondary | ICD-10-CM | POA: Diagnosis not present

## 2023-09-13 DIAGNOSIS — Z87891 Personal history of nicotine dependence: Secondary | ICD-10-CM | POA: Diagnosis not present

## 2023-09-13 DIAGNOSIS — I5033 Acute on chronic diastolic (congestive) heart failure: Secondary | ICD-10-CM | POA: Diagnosis not present

## 2023-09-13 DIAGNOSIS — E039 Hypothyroidism, unspecified: Secondary | ICD-10-CM | POA: Diagnosis not present

## 2023-09-13 DIAGNOSIS — R5381 Other malaise: Secondary | ICD-10-CM | POA: Insufficient documentation

## 2023-09-13 DIAGNOSIS — I130 Hypertensive heart and chronic kidney disease with heart failure and stage 1 through stage 4 chronic kidney disease, or unspecified chronic kidney disease: Secondary | ICD-10-CM | POA: Diagnosis not present

## 2023-09-13 DIAGNOSIS — N184 Chronic kidney disease, stage 4 (severe): Secondary | ICD-10-CM | POA: Diagnosis not present

## 2023-09-13 DIAGNOSIS — R195 Other fecal abnormalities: Secondary | ICD-10-CM | POA: Diagnosis not present

## 2023-09-13 DIAGNOSIS — Z85118 Personal history of other malignant neoplasm of bronchus and lung: Secondary | ICD-10-CM | POA: Diagnosis not present

## 2023-09-13 DIAGNOSIS — C778 Secondary and unspecified malignant neoplasm of lymph nodes of multiple regions: Secondary | ICD-10-CM | POA: Diagnosis not present

## 2023-09-14 ENCOUNTER — Inpatient Hospital Stay: Payer: Medicare HMO | Attending: Physician Assistant | Admitting: Physician Assistant

## 2023-09-14 VITALS — BP 105/60 | HR 50 | Temp 97.9°F | Resp 16

## 2023-09-14 DIAGNOSIS — Z79899 Other long term (current) drug therapy: Secondary | ICD-10-CM | POA: Insufficient documentation

## 2023-09-14 DIAGNOSIS — C3411 Malignant neoplasm of upper lobe, right bronchus or lung: Secondary | ICD-10-CM | POA: Insufficient documentation

## 2023-09-14 DIAGNOSIS — I129 Hypertensive chronic kidney disease with stage 1 through stage 4 chronic kidney disease, or unspecified chronic kidney disease: Secondary | ICD-10-CM | POA: Insufficient documentation

## 2023-09-14 DIAGNOSIS — N189 Chronic kidney disease, unspecified: Secondary | ICD-10-CM | POA: Diagnosis not present

## 2023-09-14 DIAGNOSIS — C3490 Malignant neoplasm of unspecified part of unspecified bronchus or lung: Secondary | ICD-10-CM

## 2023-09-15 DIAGNOSIS — Z85118 Personal history of other malignant neoplasm of bronchus and lung: Secondary | ICD-10-CM | POA: Diagnosis not present

## 2023-09-15 DIAGNOSIS — E43 Unspecified severe protein-calorie malnutrition: Secondary | ICD-10-CM | POA: Diagnosis not present

## 2023-09-15 DIAGNOSIS — J9601 Acute respiratory failure with hypoxia: Secondary | ICD-10-CM | POA: Diagnosis not present

## 2023-09-15 DIAGNOSIS — D509 Iron deficiency anemia, unspecified: Secondary | ICD-10-CM | POA: Diagnosis not present

## 2023-09-15 DIAGNOSIS — Z87891 Personal history of nicotine dependence: Secondary | ICD-10-CM | POA: Diagnosis not present

## 2023-09-15 DIAGNOSIS — J441 Chronic obstructive pulmonary disease with (acute) exacerbation: Secondary | ICD-10-CM | POA: Diagnosis not present

## 2023-09-15 DIAGNOSIS — I5033 Acute on chronic diastolic (congestive) heart failure: Secondary | ICD-10-CM | POA: Diagnosis not present

## 2023-09-15 DIAGNOSIS — Z602 Problems related to living alone: Secondary | ICD-10-CM | POA: Diagnosis not present

## 2023-09-15 DIAGNOSIS — G629 Polyneuropathy, unspecified: Secondary | ICD-10-CM | POA: Diagnosis not present

## 2023-09-15 DIAGNOSIS — E039 Hypothyroidism, unspecified: Secondary | ICD-10-CM | POA: Diagnosis not present

## 2023-09-15 DIAGNOSIS — N184 Chronic kidney disease, stage 4 (severe): Secondary | ICD-10-CM | POA: Diagnosis not present

## 2023-09-15 DIAGNOSIS — I130 Hypertensive heart and chronic kidney disease with heart failure and stage 1 through stage 4 chronic kidney disease, or unspecified chronic kidney disease: Secondary | ICD-10-CM | POA: Diagnosis not present

## 2023-09-15 DIAGNOSIS — Z9981 Dependence on supplemental oxygen: Secondary | ICD-10-CM | POA: Diagnosis not present

## 2023-09-17 DIAGNOSIS — G629 Polyneuropathy, unspecified: Secondary | ICD-10-CM | POA: Diagnosis not present

## 2023-09-17 DIAGNOSIS — I5033 Acute on chronic diastolic (congestive) heart failure: Secondary | ICD-10-CM | POA: Diagnosis not present

## 2023-09-17 DIAGNOSIS — Z9981 Dependence on supplemental oxygen: Secondary | ICD-10-CM | POA: Diagnosis not present

## 2023-09-17 DIAGNOSIS — N184 Chronic kidney disease, stage 4 (severe): Secondary | ICD-10-CM | POA: Diagnosis not present

## 2023-09-17 DIAGNOSIS — I130 Hypertensive heart and chronic kidney disease with heart failure and stage 1 through stage 4 chronic kidney disease, or unspecified chronic kidney disease: Secondary | ICD-10-CM | POA: Diagnosis not present

## 2023-09-17 DIAGNOSIS — J9601 Acute respiratory failure with hypoxia: Secondary | ICD-10-CM | POA: Diagnosis not present

## 2023-09-17 DIAGNOSIS — E43 Unspecified severe protein-calorie malnutrition: Secondary | ICD-10-CM | POA: Diagnosis not present

## 2023-09-17 DIAGNOSIS — D509 Iron deficiency anemia, unspecified: Secondary | ICD-10-CM | POA: Diagnosis not present

## 2023-09-17 DIAGNOSIS — Z87891 Personal history of nicotine dependence: Secondary | ICD-10-CM | POA: Diagnosis not present

## 2023-09-17 DIAGNOSIS — E039 Hypothyroidism, unspecified: Secondary | ICD-10-CM | POA: Diagnosis not present

## 2023-09-17 DIAGNOSIS — J441 Chronic obstructive pulmonary disease with (acute) exacerbation: Secondary | ICD-10-CM | POA: Diagnosis not present

## 2023-09-17 DIAGNOSIS — Z602 Problems related to living alone: Secondary | ICD-10-CM | POA: Diagnosis not present

## 2023-09-17 DIAGNOSIS — Z85118 Personal history of other malignant neoplasm of bronchus and lung: Secondary | ICD-10-CM | POA: Diagnosis not present

## 2023-09-20 DIAGNOSIS — E039 Hypothyroidism, unspecified: Secondary | ICD-10-CM | POA: Diagnosis not present

## 2023-09-20 DIAGNOSIS — G629 Polyneuropathy, unspecified: Secondary | ICD-10-CM | POA: Diagnosis not present

## 2023-09-20 DIAGNOSIS — N184 Chronic kidney disease, stage 4 (severe): Secondary | ICD-10-CM | POA: Diagnosis not present

## 2023-09-20 DIAGNOSIS — I5033 Acute on chronic diastolic (congestive) heart failure: Secondary | ICD-10-CM | POA: Diagnosis not present

## 2023-09-20 DIAGNOSIS — J9601 Acute respiratory failure with hypoxia: Secondary | ICD-10-CM | POA: Diagnosis not present

## 2023-09-20 DIAGNOSIS — Z602 Problems related to living alone: Secondary | ICD-10-CM | POA: Diagnosis not present

## 2023-09-20 DIAGNOSIS — Z85118 Personal history of other malignant neoplasm of bronchus and lung: Secondary | ICD-10-CM | POA: Diagnosis not present

## 2023-09-20 DIAGNOSIS — J441 Chronic obstructive pulmonary disease with (acute) exacerbation: Secondary | ICD-10-CM | POA: Diagnosis not present

## 2023-09-20 DIAGNOSIS — I130 Hypertensive heart and chronic kidney disease with heart failure and stage 1 through stage 4 chronic kidney disease, or unspecified chronic kidney disease: Secondary | ICD-10-CM | POA: Diagnosis not present

## 2023-09-20 DIAGNOSIS — Z9981 Dependence on supplemental oxygen: Secondary | ICD-10-CM | POA: Diagnosis not present

## 2023-09-20 DIAGNOSIS — D509 Iron deficiency anemia, unspecified: Secondary | ICD-10-CM | POA: Diagnosis not present

## 2023-09-20 DIAGNOSIS — E43 Unspecified severe protein-calorie malnutrition: Secondary | ICD-10-CM | POA: Diagnosis not present

## 2023-09-20 DIAGNOSIS — Z87891 Personal history of nicotine dependence: Secondary | ICD-10-CM | POA: Diagnosis not present

## 2023-09-21 DIAGNOSIS — Z9981 Dependence on supplemental oxygen: Secondary | ICD-10-CM | POA: Diagnosis not present

## 2023-09-21 DIAGNOSIS — Z87891 Personal history of nicotine dependence: Secondary | ICD-10-CM | POA: Diagnosis not present

## 2023-09-21 DIAGNOSIS — I130 Hypertensive heart and chronic kidney disease with heart failure and stage 1 through stage 4 chronic kidney disease, or unspecified chronic kidney disease: Secondary | ICD-10-CM | POA: Diagnosis not present

## 2023-09-21 DIAGNOSIS — Z85118 Personal history of other malignant neoplasm of bronchus and lung: Secondary | ICD-10-CM | POA: Diagnosis not present

## 2023-09-21 DIAGNOSIS — E43 Unspecified severe protein-calorie malnutrition: Secondary | ICD-10-CM | POA: Diagnosis not present

## 2023-09-21 DIAGNOSIS — J441 Chronic obstructive pulmonary disease with (acute) exacerbation: Secondary | ICD-10-CM | POA: Diagnosis not present

## 2023-09-21 DIAGNOSIS — G629 Polyneuropathy, unspecified: Secondary | ICD-10-CM | POA: Diagnosis not present

## 2023-09-21 DIAGNOSIS — I5033 Acute on chronic diastolic (congestive) heart failure: Secondary | ICD-10-CM | POA: Diagnosis not present

## 2023-09-21 DIAGNOSIS — N184 Chronic kidney disease, stage 4 (severe): Secondary | ICD-10-CM | POA: Diagnosis not present

## 2023-09-21 DIAGNOSIS — Z602 Problems related to living alone: Secondary | ICD-10-CM | POA: Diagnosis not present

## 2023-09-21 DIAGNOSIS — D509 Iron deficiency anemia, unspecified: Secondary | ICD-10-CM | POA: Diagnosis not present

## 2023-09-21 DIAGNOSIS — E039 Hypothyroidism, unspecified: Secondary | ICD-10-CM | POA: Diagnosis not present

## 2023-09-21 DIAGNOSIS — J9601 Acute respiratory failure with hypoxia: Secondary | ICD-10-CM | POA: Diagnosis not present

## 2023-09-27 DIAGNOSIS — Z602 Problems related to living alone: Secondary | ICD-10-CM | POA: Diagnosis not present

## 2023-09-27 DIAGNOSIS — G629 Polyneuropathy, unspecified: Secondary | ICD-10-CM | POA: Diagnosis not present

## 2023-09-27 DIAGNOSIS — I5033 Acute on chronic diastolic (congestive) heart failure: Secondary | ICD-10-CM | POA: Diagnosis not present

## 2023-09-27 DIAGNOSIS — I130 Hypertensive heart and chronic kidney disease with heart failure and stage 1 through stage 4 chronic kidney disease, or unspecified chronic kidney disease: Secondary | ICD-10-CM | POA: Diagnosis not present

## 2023-09-27 DIAGNOSIS — Z87891 Personal history of nicotine dependence: Secondary | ICD-10-CM | POA: Diagnosis not present

## 2023-09-27 DIAGNOSIS — D509 Iron deficiency anemia, unspecified: Secondary | ICD-10-CM | POA: Diagnosis not present

## 2023-09-27 DIAGNOSIS — Z9981 Dependence on supplemental oxygen: Secondary | ICD-10-CM | POA: Diagnosis not present

## 2023-09-27 DIAGNOSIS — F418 Other specified anxiety disorders: Secondary | ICD-10-CM | POA: Insufficient documentation

## 2023-09-27 DIAGNOSIS — J441 Chronic obstructive pulmonary disease with (acute) exacerbation: Secondary | ICD-10-CM | POA: Diagnosis not present

## 2023-09-27 DIAGNOSIS — E43 Unspecified severe protein-calorie malnutrition: Secondary | ICD-10-CM | POA: Diagnosis not present

## 2023-09-27 DIAGNOSIS — F419 Anxiety disorder, unspecified: Secondary | ICD-10-CM | POA: Insufficient documentation

## 2023-09-27 DIAGNOSIS — E039 Hypothyroidism, unspecified: Secondary | ICD-10-CM | POA: Diagnosis not present

## 2023-09-27 DIAGNOSIS — N184 Chronic kidney disease, stage 4 (severe): Secondary | ICD-10-CM | POA: Diagnosis not present

## 2023-09-27 DIAGNOSIS — J9601 Acute respiratory failure with hypoxia: Secondary | ICD-10-CM | POA: Diagnosis not present

## 2023-09-27 DIAGNOSIS — Z85118 Personal history of other malignant neoplasm of bronchus and lung: Secondary | ICD-10-CM | POA: Diagnosis not present

## 2023-10-01 DIAGNOSIS — J441 Chronic obstructive pulmonary disease with (acute) exacerbation: Secondary | ICD-10-CM | POA: Diagnosis not present

## 2023-10-01 DIAGNOSIS — I5033 Acute on chronic diastolic (congestive) heart failure: Secondary | ICD-10-CM | POA: Diagnosis not present

## 2023-10-01 DIAGNOSIS — J9601 Acute respiratory failure with hypoxia: Secondary | ICD-10-CM | POA: Diagnosis not present

## 2023-10-01 DIAGNOSIS — N184 Chronic kidney disease, stage 4 (severe): Secondary | ICD-10-CM | POA: Diagnosis not present

## 2023-10-01 DIAGNOSIS — Z602 Problems related to living alone: Secondary | ICD-10-CM | POA: Diagnosis not present

## 2023-10-01 DIAGNOSIS — Z85118 Personal history of other malignant neoplasm of bronchus and lung: Secondary | ICD-10-CM | POA: Diagnosis not present

## 2023-10-01 DIAGNOSIS — G629 Polyneuropathy, unspecified: Secondary | ICD-10-CM | POA: Diagnosis not present

## 2023-10-01 DIAGNOSIS — E039 Hypothyroidism, unspecified: Secondary | ICD-10-CM | POA: Diagnosis not present

## 2023-10-01 DIAGNOSIS — E43 Unspecified severe protein-calorie malnutrition: Secondary | ICD-10-CM | POA: Diagnosis not present

## 2023-10-01 DIAGNOSIS — D509 Iron deficiency anemia, unspecified: Secondary | ICD-10-CM | POA: Diagnosis not present

## 2023-10-01 DIAGNOSIS — I130 Hypertensive heart and chronic kidney disease with heart failure and stage 1 through stage 4 chronic kidney disease, or unspecified chronic kidney disease: Secondary | ICD-10-CM | POA: Diagnosis not present

## 2023-10-01 DIAGNOSIS — Z9981 Dependence on supplemental oxygen: Secondary | ICD-10-CM | POA: Diagnosis not present

## 2023-10-01 DIAGNOSIS — Z87891 Personal history of nicotine dependence: Secondary | ICD-10-CM | POA: Diagnosis not present

## 2023-10-03 DIAGNOSIS — R0902 Hypoxemia: Secondary | ICD-10-CM | POA: Diagnosis not present

## 2023-10-03 DIAGNOSIS — I110 Hypertensive heart disease with heart failure: Secondary | ICD-10-CM | POA: Diagnosis not present

## 2023-10-03 DIAGNOSIS — J449 Chronic obstructive pulmonary disease, unspecified: Secondary | ICD-10-CM | POA: Diagnosis not present

## 2023-10-03 DIAGNOSIS — M6281 Muscle weakness (generalized): Secondary | ICD-10-CM | POA: Diagnosis not present

## 2023-10-03 DIAGNOSIS — C3412 Malignant neoplasm of upper lobe, left bronchus or lung: Secondary | ICD-10-CM | POA: Diagnosis not present

## 2023-10-03 DIAGNOSIS — J441 Chronic obstructive pulmonary disease with (acute) exacerbation: Secondary | ICD-10-CM | POA: Diagnosis not present

## 2023-10-03 DIAGNOSIS — J9601 Acute respiratory failure with hypoxia: Secondary | ICD-10-CM | POA: Diagnosis not present

## 2023-10-03 DIAGNOSIS — R262 Difficulty in walking, not elsewhere classified: Secondary | ICD-10-CM | POA: Diagnosis not present

## 2023-10-03 DIAGNOSIS — R531 Weakness: Secondary | ICD-10-CM | POA: Diagnosis not present

## 2023-10-03 DIAGNOSIS — R2689 Other abnormalities of gait and mobility: Secondary | ICD-10-CM | POA: Diagnosis not present

## 2023-10-03 DIAGNOSIS — I509 Heart failure, unspecified: Secondary | ICD-10-CM | POA: Diagnosis not present

## 2023-10-03 DIAGNOSIS — N184 Chronic kidney disease, stage 4 (severe): Secondary | ICD-10-CM | POA: Diagnosis not present

## 2023-10-04 DIAGNOSIS — J441 Chronic obstructive pulmonary disease with (acute) exacerbation: Secondary | ICD-10-CM | POA: Diagnosis not present

## 2023-10-04 DIAGNOSIS — Z85118 Personal history of other malignant neoplasm of bronchus and lung: Secondary | ICD-10-CM | POA: Diagnosis not present

## 2023-10-04 DIAGNOSIS — Z602 Problems related to living alone: Secondary | ICD-10-CM | POA: Diagnosis not present

## 2023-10-04 DIAGNOSIS — I130 Hypertensive heart and chronic kidney disease with heart failure and stage 1 through stage 4 chronic kidney disease, or unspecified chronic kidney disease: Secondary | ICD-10-CM | POA: Diagnosis not present

## 2023-10-04 DIAGNOSIS — G629 Polyneuropathy, unspecified: Secondary | ICD-10-CM | POA: Diagnosis not present

## 2023-10-04 DIAGNOSIS — Z87891 Personal history of nicotine dependence: Secondary | ICD-10-CM | POA: Diagnosis not present

## 2023-10-04 DIAGNOSIS — E039 Hypothyroidism, unspecified: Secondary | ICD-10-CM | POA: Diagnosis not present

## 2023-10-04 DIAGNOSIS — Z9981 Dependence on supplemental oxygen: Secondary | ICD-10-CM | POA: Diagnosis not present

## 2023-10-04 DIAGNOSIS — E43 Unspecified severe protein-calorie malnutrition: Secondary | ICD-10-CM | POA: Diagnosis not present

## 2023-10-04 DIAGNOSIS — J9601 Acute respiratory failure with hypoxia: Secondary | ICD-10-CM | POA: Diagnosis not present

## 2023-10-04 DIAGNOSIS — D509 Iron deficiency anemia, unspecified: Secondary | ICD-10-CM | POA: Diagnosis not present

## 2023-10-04 DIAGNOSIS — N184 Chronic kidney disease, stage 4 (severe): Secondary | ICD-10-CM | POA: Diagnosis not present

## 2023-10-04 DIAGNOSIS — I5033 Acute on chronic diastolic (congestive) heart failure: Secondary | ICD-10-CM | POA: Diagnosis not present

## 2023-10-05 DIAGNOSIS — D509 Iron deficiency anemia, unspecified: Secondary | ICD-10-CM | POA: Diagnosis not present

## 2023-10-05 DIAGNOSIS — G629 Polyneuropathy, unspecified: Secondary | ICD-10-CM | POA: Diagnosis not present

## 2023-10-05 DIAGNOSIS — J9601 Acute respiratory failure with hypoxia: Secondary | ICD-10-CM | POA: Diagnosis not present

## 2023-10-05 DIAGNOSIS — Z87891 Personal history of nicotine dependence: Secondary | ICD-10-CM | POA: Diagnosis not present

## 2023-10-05 DIAGNOSIS — I130 Hypertensive heart and chronic kidney disease with heart failure and stage 1 through stage 4 chronic kidney disease, or unspecified chronic kidney disease: Secondary | ICD-10-CM | POA: Diagnosis not present

## 2023-10-05 DIAGNOSIS — Z85118 Personal history of other malignant neoplasm of bronchus and lung: Secondary | ICD-10-CM | POA: Diagnosis not present

## 2023-10-05 DIAGNOSIS — Z9981 Dependence on supplemental oxygen: Secondary | ICD-10-CM | POA: Diagnosis not present

## 2023-10-05 DIAGNOSIS — E43 Unspecified severe protein-calorie malnutrition: Secondary | ICD-10-CM | POA: Diagnosis not present

## 2023-10-05 DIAGNOSIS — E039 Hypothyroidism, unspecified: Secondary | ICD-10-CM | POA: Diagnosis not present

## 2023-10-05 DIAGNOSIS — I5033 Acute on chronic diastolic (congestive) heart failure: Secondary | ICD-10-CM | POA: Diagnosis not present

## 2023-10-05 DIAGNOSIS — Z602 Problems related to living alone: Secondary | ICD-10-CM | POA: Diagnosis not present

## 2023-10-05 DIAGNOSIS — J441 Chronic obstructive pulmonary disease with (acute) exacerbation: Secondary | ICD-10-CM | POA: Diagnosis not present

## 2023-10-05 DIAGNOSIS — N184 Chronic kidney disease, stage 4 (severe): Secondary | ICD-10-CM | POA: Diagnosis not present

## 2023-10-06 DIAGNOSIS — E43 Unspecified severe protein-calorie malnutrition: Secondary | ICD-10-CM | POA: Diagnosis not present

## 2023-10-06 DIAGNOSIS — I5033 Acute on chronic diastolic (congestive) heart failure: Secondary | ICD-10-CM | POA: Diagnosis not present

## 2023-10-06 DIAGNOSIS — E039 Hypothyroidism, unspecified: Secondary | ICD-10-CM | POA: Diagnosis not present

## 2023-10-06 DIAGNOSIS — I130 Hypertensive heart and chronic kidney disease with heart failure and stage 1 through stage 4 chronic kidney disease, or unspecified chronic kidney disease: Secondary | ICD-10-CM | POA: Diagnosis not present

## 2023-10-06 DIAGNOSIS — Z602 Problems related to living alone: Secondary | ICD-10-CM | POA: Diagnosis not present

## 2023-10-06 DIAGNOSIS — J441 Chronic obstructive pulmonary disease with (acute) exacerbation: Secondary | ICD-10-CM | POA: Diagnosis not present

## 2023-10-06 DIAGNOSIS — N184 Chronic kidney disease, stage 4 (severe): Secondary | ICD-10-CM | POA: Diagnosis not present

## 2023-10-06 DIAGNOSIS — D509 Iron deficiency anemia, unspecified: Secondary | ICD-10-CM | POA: Diagnosis not present

## 2023-10-06 DIAGNOSIS — Z85118 Personal history of other malignant neoplasm of bronchus and lung: Secondary | ICD-10-CM | POA: Diagnosis not present

## 2023-10-06 DIAGNOSIS — Z9981 Dependence on supplemental oxygen: Secondary | ICD-10-CM | POA: Diagnosis not present

## 2023-10-06 DIAGNOSIS — Z87891 Personal history of nicotine dependence: Secondary | ICD-10-CM | POA: Diagnosis not present

## 2023-10-06 DIAGNOSIS — G629 Polyneuropathy, unspecified: Secondary | ICD-10-CM | POA: Diagnosis not present

## 2023-10-06 DIAGNOSIS — J9601 Acute respiratory failure with hypoxia: Secondary | ICD-10-CM | POA: Diagnosis not present

## 2023-10-07 ENCOUNTER — Emergency Department (HOSPITAL_COMMUNITY): Payer: Medicare HMO

## 2023-10-07 ENCOUNTER — Other Ambulatory Visit: Payer: Self-pay

## 2023-10-07 ENCOUNTER — Inpatient Hospital Stay (HOSPITAL_COMMUNITY)
Admission: EM | Admit: 2023-10-07 | Discharge: 2023-10-11 | DRG: 682 | Disposition: A | Discharge status: Skilled Nursing Facility | Payer: Medicare HMO | Attending: Internal Medicine | Admitting: Internal Medicine

## 2023-10-07 DIAGNOSIS — J439 Emphysema, unspecified: Secondary | ICD-10-CM | POA: Diagnosis present

## 2023-10-07 DIAGNOSIS — D638 Anemia in other chronic diseases classified elsewhere: Secondary | ICD-10-CM | POA: Diagnosis not present

## 2023-10-07 DIAGNOSIS — Z923 Personal history of irradiation: Secondary | ICD-10-CM

## 2023-10-07 DIAGNOSIS — Z800 Family history of malignant neoplasm of digestive organs: Secondary | ICD-10-CM

## 2023-10-07 DIAGNOSIS — Z87891 Personal history of nicotine dependence: Secondary | ICD-10-CM

## 2023-10-07 DIAGNOSIS — Z79899 Other long term (current) drug therapy: Secondary | ICD-10-CM

## 2023-10-07 DIAGNOSIS — R498 Other voice and resonance disorders: Secondary | ICD-10-CM | POA: Diagnosis not present

## 2023-10-07 DIAGNOSIS — G9349 Other encephalopathy: Principal | ICD-10-CM

## 2023-10-07 DIAGNOSIS — Z9981 Dependence on supplemental oxygen: Secondary | ICD-10-CM

## 2023-10-07 DIAGNOSIS — I130 Hypertensive heart and chronic kidney disease with heart failure and stage 1 through stage 4 chronic kidney disease, or unspecified chronic kidney disease: Secondary | ICD-10-CM | POA: Diagnosis present

## 2023-10-07 DIAGNOSIS — I5032 Chronic diastolic (congestive) heart failure: Secondary | ICD-10-CM | POA: Diagnosis not present

## 2023-10-07 DIAGNOSIS — J9611 Chronic respiratory failure with hypoxia: Secondary | ICD-10-CM | POA: Diagnosis not present

## 2023-10-07 DIAGNOSIS — Z66 Do not resuscitate: Secondary | ICD-10-CM | POA: Diagnosis not present

## 2023-10-07 DIAGNOSIS — J9601 Acute respiratory failure with hypoxia: Secondary | ICD-10-CM | POA: Diagnosis not present

## 2023-10-07 DIAGNOSIS — G894 Chronic pain syndrome: Secondary | ICD-10-CM | POA: Diagnosis not present

## 2023-10-07 DIAGNOSIS — R627 Adult failure to thrive: Secondary | ICD-10-CM | POA: Insufficient documentation

## 2023-10-07 DIAGNOSIS — I10 Essential (primary) hypertension: Secondary | ICD-10-CM | POA: Diagnosis not present

## 2023-10-07 DIAGNOSIS — I452 Bifascicular block: Secondary | ICD-10-CM | POA: Diagnosis not present

## 2023-10-07 DIAGNOSIS — R64 Cachexia: Secondary | ICD-10-CM | POA: Diagnosis not present

## 2023-10-07 DIAGNOSIS — F4321 Adjustment disorder with depressed mood: Secondary | ICD-10-CM | POA: Diagnosis not present

## 2023-10-07 DIAGNOSIS — E871 Hypo-osmolality and hyponatremia: Secondary | ICD-10-CM | POA: Diagnosis present

## 2023-10-07 DIAGNOSIS — Z515 Encounter for palliative care: Secondary | ICD-10-CM | POA: Diagnosis not present

## 2023-10-07 DIAGNOSIS — R4182 Altered mental status, unspecified: Secondary | ICD-10-CM | POA: Diagnosis not present

## 2023-10-07 DIAGNOSIS — R443 Hallucinations, unspecified: Secondary | ICD-10-CM | POA: Diagnosis not present

## 2023-10-07 DIAGNOSIS — G9341 Metabolic encephalopathy: Secondary | ICD-10-CM

## 2023-10-07 DIAGNOSIS — Z85118 Personal history of other malignant neoplasm of bronchus and lung: Secondary | ICD-10-CM

## 2023-10-07 DIAGNOSIS — Z743 Need for continuous supervision: Secondary | ICD-10-CM | POA: Diagnosis not present

## 2023-10-07 DIAGNOSIS — J449 Chronic obstructive pulmonary disease, unspecified: Secondary | ICD-10-CM | POA: Diagnosis not present

## 2023-10-07 DIAGNOSIS — E8809 Other disorders of plasma-protein metabolism, not elsewhere classified: Secondary | ICD-10-CM | POA: Insufficient documentation

## 2023-10-07 DIAGNOSIS — I700 Atherosclerosis of aorta: Secondary | ICD-10-CM | POA: Diagnosis not present

## 2023-10-07 DIAGNOSIS — E039 Hypothyroidism, unspecified: Secondary | ICD-10-CM | POA: Diagnosis present

## 2023-10-07 DIAGNOSIS — Z79890 Hormone replacement therapy (postmenopausal): Secondary | ICD-10-CM

## 2023-10-07 DIAGNOSIS — Z681 Body mass index (BMI) 19 or less, adult: Secondary | ICD-10-CM

## 2023-10-07 DIAGNOSIS — N400 Enlarged prostate without lower urinary tract symptoms: Secondary | ICD-10-CM | POA: Diagnosis present

## 2023-10-07 DIAGNOSIS — N401 Enlarged prostate with lower urinary tract symptoms: Secondary | ICD-10-CM | POA: Diagnosis not present

## 2023-10-07 DIAGNOSIS — Z9221 Personal history of antineoplastic chemotherapy: Secondary | ICD-10-CM

## 2023-10-07 DIAGNOSIS — E860 Dehydration: Secondary | ICD-10-CM | POA: Diagnosis present

## 2023-10-07 DIAGNOSIS — E43 Unspecified severe protein-calorie malnutrition: Secondary | ICD-10-CM | POA: Diagnosis not present

## 2023-10-07 DIAGNOSIS — D6489 Other specified anemias: Secondary | ICD-10-CM | POA: Diagnosis not present

## 2023-10-07 DIAGNOSIS — R531 Weakness: Secondary | ICD-10-CM | POA: Diagnosis not present

## 2023-10-07 DIAGNOSIS — R9431 Abnormal electrocardiogram [ECG] [EKG]: Secondary | ICD-10-CM | POA: Diagnosis not present

## 2023-10-07 DIAGNOSIS — G9340 Encephalopathy, unspecified: Secondary | ICD-10-CM | POA: Diagnosis not present

## 2023-10-07 DIAGNOSIS — L80 Vitiligo: Secondary | ICD-10-CM | POA: Diagnosis present

## 2023-10-07 DIAGNOSIS — R488 Other symbolic dysfunctions: Secondary | ICD-10-CM | POA: Diagnosis not present

## 2023-10-07 DIAGNOSIS — D509 Iron deficiency anemia, unspecified: Secondary | ICD-10-CM | POA: Diagnosis not present

## 2023-10-07 DIAGNOSIS — I110 Hypertensive heart disease with heart failure: Secondary | ICD-10-CM | POA: Diagnosis not present

## 2023-10-07 DIAGNOSIS — M6281 Muscle weakness (generalized): Secondary | ICD-10-CM | POA: Diagnosis not present

## 2023-10-07 DIAGNOSIS — N1832 Chronic kidney disease, stage 3b: Secondary | ICD-10-CM | POA: Diagnosis not present

## 2023-10-07 DIAGNOSIS — N184 Chronic kidney disease, stage 4 (severe): Secondary | ICD-10-CM | POA: Diagnosis not present

## 2023-10-07 DIAGNOSIS — I6782 Cerebral ischemia: Secondary | ICD-10-CM | POA: Diagnosis not present

## 2023-10-07 DIAGNOSIS — N19 Unspecified kidney failure: Principal | ICD-10-CM

## 2023-10-07 DIAGNOSIS — R262 Difficulty in walking, not elsewhere classified: Secondary | ICD-10-CM | POA: Diagnosis not present

## 2023-10-07 DIAGNOSIS — N390 Urinary tract infection, site not specified: Secondary | ICD-10-CM | POA: Diagnosis not present

## 2023-10-07 DIAGNOSIS — E46 Unspecified protein-calorie malnutrition: Secondary | ICD-10-CM | POA: Insufficient documentation

## 2023-10-07 DIAGNOSIS — R442 Other hallucinations: Secondary | ICD-10-CM | POA: Diagnosis not present

## 2023-10-07 DIAGNOSIS — R54 Age-related physical debility: Secondary | ICD-10-CM | POA: Diagnosis present

## 2023-10-07 DIAGNOSIS — Z7951 Long term (current) use of inhaled steroids: Secondary | ICD-10-CM

## 2023-10-07 DIAGNOSIS — R770 Abnormality of albumin: Secondary | ICD-10-CM | POA: Diagnosis not present

## 2023-10-07 DIAGNOSIS — C3412 Malignant neoplasm of upper lobe, left bronchus or lung: Secondary | ICD-10-CM | POA: Diagnosis not present

## 2023-10-07 DIAGNOSIS — N179 Acute kidney failure, unspecified: Principal | ICD-10-CM | POA: Diagnosis present

## 2023-10-07 LAB — BLOOD GAS, VENOUS
Acid-base deficit: 0.3 mmol/L (ref 0.0–2.0)
Bicarbonate: 24.8 mmol/L (ref 20.0–28.0)
Drawn by: 4237
O2 Saturation: 44.2 %
Patient temperature: 37.6
pCO2, Ven: 43 mm[Hg] — ABNORMAL LOW (ref 44–60)
pH, Ven: 7.37 (ref 7.25–7.43)
pO2, Ven: <31 mm[Hg] — CL (ref 32–45)

## 2023-10-07 LAB — COMPREHENSIVE METABOLIC PANEL
ALT: 14 U/L (ref 0–44)
AST: 31 U/L (ref 15–41)
Albumin: 3.1 g/dL — ABNORMAL LOW (ref 3.5–5.0)
Alkaline Phosphatase: 74 U/L (ref 38–126)
Anion gap: 14 (ref 5–15)
BUN: 66 mg/dL — ABNORMAL HIGH (ref 8–23)
CO2: 22 mmol/L (ref 22–32)
Calcium: 8.7 mg/dL — ABNORMAL LOW (ref 8.9–10.3)
Chloride: 98 mmol/L (ref 98–111)
Creatinine, Ser: 3.78 mg/dL — ABNORMAL HIGH (ref 0.61–1.24)
GFR, Estimated: 15 mL/min — ABNORMAL LOW (ref >60)
Glucose, Bld: 84 mg/dL (ref 70–99)
Potassium: 4.2 mmol/L (ref 3.5–5.1)
Sodium: 134 mmol/L — ABNORMAL LOW (ref 135–145)
Total Bilirubin: 0.8 mg/dL (ref 0.0–1.2)
Total Protein: 7.8 g/dL (ref 6.5–8.1)

## 2023-10-07 LAB — CBC WITH DIFFERENTIAL/PLATELET
Abs Immature Granulocytes: 0.01 10*3/uL (ref 0.00–0.07)
Basophils Absolute: 0 10*3/uL (ref 0.0–0.1)
Basophils Relative: 0 %
Eosinophils Absolute: 0 10*3/uL (ref 0.0–0.5)
Eosinophils Relative: 0 %
HCT: 32.5 % — ABNORMAL LOW (ref 39.0–52.0)
Hemoglobin: 10.5 g/dL — ABNORMAL LOW (ref 13.0–17.0)
Immature Granulocytes: 0 %
Lymphocytes Relative: 8 %
Lymphs Abs: 0.4 10*3/uL — ABNORMAL LOW (ref 0.7–4.0)
MCH: 29.2 pg (ref 26.0–34.0)
MCHC: 32.3 g/dL (ref 30.0–36.0)
MCV: 90.3 fL (ref 80.0–100.0)
Monocytes Absolute: 0.3 10*3/uL (ref 0.1–1.0)
Monocytes Relative: 5 %
Neutro Abs: 4.6 10*3/uL (ref 1.7–7.7)
Neutrophils Relative %: 87 %
Platelets: 168 10*3/uL (ref 150–400)
RBC: 3.6 MIL/uL — ABNORMAL LOW (ref 4.22–5.81)
RDW: 16.3 % — ABNORMAL HIGH (ref 11.5–15.5)
WBC: 5.3 10*3/uL (ref 4.0–10.5)
nRBC: 0 % (ref 0.0–0.2)

## 2023-10-07 LAB — AMMONIA: Ammonia: 13 umol/L (ref 9–35)

## 2023-10-07 LAB — CBG MONITORING, ED: Glucose-Capillary: 82 mg/dL (ref 70–99)

## 2023-10-07 LAB — LACTIC ACID, PLASMA: Lactic Acid, Venous: 1.5 mmol/L (ref 0.5–1.9)

## 2023-10-07 MED ORDER — ENSURE ENLIVE PO LIQD
237.0000 mL | Freq: Two times a day (BID) | ORAL | Status: DC
  Administered 2023-10-08: 237 mL via ORAL

## 2023-10-07 MED ORDER — ACETAMINOPHEN 650 MG RE SUPP: 650.0000 mg | Freq: Four times a day (QID) | RECTAL | Status: DC | PRN

## 2023-10-07 MED ORDER — SODIUM CHLORIDE 0.9 % IV BOLUS
1000.0000 mL | Freq: Once | INTRAVENOUS | Status: AC
  Administered 2023-10-07: 1000 mL via INTRAVENOUS

## 2023-10-07 MED ORDER — HEPARIN SODIUM (PORCINE) 5000 UNIT/ML IJ SOLN
5000.0000 [IU] | Freq: Three times a day (TID) | INTRAMUSCULAR | Status: DC
  Administered 2023-10-08 – 2023-10-11 (×11): 5000 [IU] via SUBCUTANEOUS
  Filled 2023-10-07 (×11): qty 1

## 2023-10-07 MED ORDER — LACTATED RINGERS IV SOLN: INTRAVENOUS | Status: DC

## 2023-10-07 MED ORDER — ACETAMINOPHEN 325 MG PO TABS: 650.0000 mg | ORAL_TABLET | Freq: Four times a day (QID) | ORAL | Status: DC | PRN

## 2023-10-07 MED ORDER — PROCHLORPERAZINE EDISYLATE 10 MG/2ML IJ SOLN: 10.0000 mg | Freq: Four times a day (QID) | INTRAMUSCULAR | Status: DC | PRN

## 2023-10-07 NOTE — ED Triage Notes (Signed)
 Pt arrived via [facility] from home c/o generalized weakness, unable to stand x 2 days--baseline pt can stand with a walker. Also, per EMS per family member, pt is seeing things that are not there such as people and bugs. Pt is A&O to self and year, pt also aware he is at the hospital. Pt on 2 L via [place] at baseline. Family concerned for dehydration

## 2023-10-07 NOTE — H&P (Signed)
 History and Physical    Patient: [name] FMW:[id number] DOB: 01-May-1933 DOA: 10/07/2023 DOS: the patient was seen and examined on 10/07/2023 PCP: Joeann Browning, FNP  Patient coming from: Home  Chief Complaint:  Chief Complaint  Patient presents with   Weakness   HPI: [name] is a 89 y.o. male with medical history significant of hypertension, hypothyroidism, lung cancer, chronic respiratory failure with hypoxia on supplemental oxygen  at 2 LPM via [place] who presents to the emergency department from home via EMS due to 2-day onset of generalized weakness and inability to stand (different from baseline way he was able to ambulate with a walker).  Patient was unable to provide history, history was obtained from ED PA and ED medical record.  Per report, patient was reported to be seeing things such as bugs and people that are not there.  Family was concerned that patient is dehydrated.  EMS was activated and patient was taken to the ED for further evaluation and management.  ED Course:  In the emergency department, he was hemodynamically stable.  Workup in the ED showed normocytic anemia.  BMP was normal except for sodium of 134 and BUN/creatinine 66/3.78 (baseline creatinine at 2.1-2.5).  Albumin 3.1, lactic acid 1.5, ammonia 13. CT without contrast showed no CT etiology for altered mental status Chest x-ray showed no active disease.  Emphysema with right upper lobe/perihilar scarring and distortion as seen on prior exam. IV NS 1 L was provided.  Hospitalist was asked to admit patient for further evaluation and management.  Review of Systems: Review of systems as noted in the HPI. All other systems reviewed and are negative.   Past Medical History:  Diagnosis Date   Arthritis    Hypertension    Hyperthyroidism    lung ca 11/2020   Past Surgical History:  Procedure Laterality Date   COLONOSCOPY N/A 09/02/2016   Procedure: COLONOSCOPY;  Surgeon: Claudis RAYMOND Rivet, MD;   Location: [facility];  Service: Endoscopy;  Laterality: N/A;  830   NO PAST SURGERIES     POLYPECTOMY  09/02/2016   Procedure: POLYPECTOMY;  Surgeon: Claudis RAYMOND Rivet, MD;  Location: [facility];  Service: Endoscopy;;    Social History:  reports that he quit smoking about 25 years ago. His smoking use included cigarettes. He started smoking about 65 years ago. He has a 60 pack-year smoking history. He has never used smokeless tobacco. He reports that he does not drink alcohol and does not use drugs.   No Known Allergies  Family History  Problem Relation Age of Onset   Colon cancer Other      Prior to Admission medications   Medication Sig Start Date End Date Taking? Authorizing Provider  amLODipine  (NORVASC ) 10 MG tablet Take 1 tablet (10 mg total) by mouth daily. Hold for blood pressure reading <130 09/01/23  Yes Landy Barnie RAMAN, NP  buPROPion  ER (WELLBUTRIN  SR) 100 MG 12 hr tablet Take 100 mg by mouth 2 (two) times daily.   Yes [provider]  calcium  carbonate (OSCAL) 1500 (600 Ca) MG TABS tablet Take 500 mg by mouth daily with breakfast. For calcium  supplement   Yes [provider]  ferrous sulfate  325 (65 FE) MG EC tablet Take 1 tablet (325 mg total) by mouth 3 (three) times a week. 09/01/23  Yes Landy Barnie RAMAN, NP  fluticasone -salmeterol (ADVAIR) 250-50 MCG/ACT AEPB Inhale 1 puff into the lungs in the morning and at bedtime. Patient taking differently: Inhale  1 puff into the lungs daily as needed (wheezing, shortness of breath). 09/01/23  Yes Landy Barnie RAMAN, NP  furosemide  (LASIX ) 40 MG tablet Take 1 tablet (40 mg total) by mouth daily. 09/01/23  Yes Landy Barnie RAMAN, NP  gabapentin  (NEURONTIN ) 250 MG/5ML solution Take 3 mLs (150 mg total) by mouth at bedtime. 09/01/23  Yes Landy Barnie RAMAN, NP  labetalol  (NORMODYNE ) 200 MG tablet Take 1 tablet (200 mg total) by mouth 2 (two) times daily. Hold for blood pressure reading <130 09/01/23  Yes Landy Barnie RAMAN, NP   levothyroxine  (SYNTHROID ) 150 MCG tablet Take 1 tablet (150 mcg total) by mouth daily. 09/01/23  Yes Landy Barnie RAMAN, NP  Protein (PROSOURCE) PACK Take 1 each by mouth 3 (three) times daily. Patient taking differently: Take 1 each by mouth 3 (three) times daily. Now Ensures at home 09/01/23  Yes Landy Barnie RAMAN, NP  tadalafil  (CIALIS ) 5 MG tablet Take 1 tablet (5 mg total) by mouth daily. 09/01/23  Yes Landy Barnie RAMAN, NP  tamsulosin  (FLOMAX ) 0.4 MG CAPS capsule Take 1 capsule (0.4 mg total) by mouth daily. 09/01/23  Yes Landy Barnie RAMAN, NP  Tiotropium Bromide  Monohydrate (SPIRIVA  RESPIMAT) 1.25 MCG/ACT AERS Inhale 2 puffs into the lungs daily at 2 PM. 09/01/23  Yes Green, Barnie RAMAN, NP  umeclidinium bromide  (INCRUSE ELLIPTA ) 62.5 MCG/ACT AEPB Inhale 1 puff into the lungs daily.   Yes [provider]    Physical Exam: BP (!) 150/57 (BP Location: Right Arm)   Pulse 77   Temp 98.4 F (36.9 C)   Resp 18   SpO2 96%   General: 89 y.o. year-old male chronically ill, cachectic, but in no acute distress.  Alert and oriented x 1 (self). HEENT: NCAT, EOMI, dry mucous membrane Neck: Supple, trachea medial Cardiovascular: Regular rate and rhythm with no rubs or gallops.  No thyromegaly or JVD noted.  No lower extremity edema. 2/4 pulses in all 4 extremities. Respiratory: Clear to auscultation with no wheezes or rales. Good inspiratory effort. Abdomen: Soft, nontender nondistended with normal bowel sounds x4 quadrants. Muskuloskeletal: No cyanosis, clubbing or edema noted bilaterally Neuro: CN II-XII intact, sensation, reflexes intact Skin: Noted skin depigmentation.  Decreased skin turgor.  No ulcerative lesions noted. Psychiatry:  Mood is appropriate for condition and setting          Labs on Admission:  Basic Metabolic Panel: Recent Labs  Lab 10/07/23 1604  NA 134*  K 4.2  CL 98  CO2 22  GLUCOSE 84  BUN 66*  CREATININE 3.78*  CALCIUM  8.7*   Liver Function Tests: Recent  Labs  Lab 10/07/23 1604  AST 31  ALT 14  ALKPHOS 74  BILITOT 0.8  PROT 7.8  ALBUMIN 3.1*   No results for input(s): LIPASE, AMYLASE in the last 168 hours. Recent Labs  Lab 10/07/23 1604  AMMONIA 13   CBC: Recent Labs  Lab 10/07/23 1604  WBC 5.3  NEUTROABS 4.6  HGB 10.5*  HCT 32.5*  MCV 90.3  PLT 168   Cardiac Enzymes: No results for input(s): CKTOTAL, CKMB, CKMBINDEX, TROPONINI in the last 168 hours.  BNP (last 3 results) Recent Labs    07/07/23 1818  BNP 175.0*    ProBNP (last 3 results) No results for input(s): PROBNP in the last 8760 hours.  CBG: Recent Labs  Lab 10/07/23 1546  GLUCAP 82    Radiological Exams on Admission: CT HEAD WO CONTRAST Result Date: 10/07/2023 CLINICAL DATA:  Mental status change,  unknown cause EXAM: CT HEAD WITHOUT CONTRAST TECHNIQUE: Contiguous axial images were obtained from the base of the skull through the vertex without intravenous contrast. RADIATION DOSE REDUCTION: This exam was performed according to the departmental dose-optimization program which includes automated exposure control, adjustment of the mA and/or kV according to patient size and/or use of iterative reconstruction technique. COMPARISON:  Brain MR 01/02/21 FINDINGS: Brain: No hemorrhage. No hydrocephalus. No extra-axial fluid collection. No CT evidence of an acute cortical infarct. No mass effect. No mass lesion. There is a background of mild chronic microvascular ischemic change. Vascular: No hyperdense vessel or unexpected calcification. Skull: Normal. Negative for fracture or focal lesion. Sinuses/Orbits: No middle ear or mastoid effusion. Paranasal sinuses clear. Orbits are unremarkable. Other: None. IMPRESSION: No CT etiology for altered mental status identified. Electronically Signed   By: Lyndall Gore M.D.   On: 10/07/2023 16:15   DG Chest Port 1 View Result Date: 10/07/2023 CLINICAL DATA:  Altered mental status EXAM: PORTABLE CHEST 1 VIEW  COMPARISON:  07/07/2023, PET CT 08/31/2023, chest x-ray 12/12/2020, CT 01/27/2023 FINDINGS: Emphysema. Stable bandlike scarring in the right upper lung with distortion along the right upper lobe pleuroparenchymal surface and consistent with scarring. Right hilar fullness and distortion, no change and presumably due to post therapeutic change, no findings suspicious for recurrence on interval PET CT 08/31/2023. stable cardiomediastinal silhouette with aortic atherosclerosis. No pneumothorax IMPRESSION: No active disease. Emphysema with right upper lobe/parahilar scarring and distortion as seen on prior exam. Electronically Signed   By: Luke Bun M.D.   On: 10/07/2023 15:56    EKG: I independently viewed the EKG done and my findings are as followed: Normal sinus rhythm at rate of 72 bpm with prolonged PR interval, RBBB and LAFB and QTc of 510 ms  Assessment/Plan Present on Admission:  Benign prostatic hyperplasia  Essential hypertension  Acquired hypothyroidism  COPD (chronic obstructive pulmonary disease) (HCC)  Chronic diastolic CHF (congestive heart failure) (HCC)  Principal Problem:   Acute metabolic encephalopathy Active Problems:   COPD (chronic obstructive pulmonary disease) (HCC)   Chronic diastolic CHF (congestive heart failure) (HCC)   Essential hypertension   Acquired hypothyroidism   Benign prostatic hyperplasia   Iron  deficiency anemia   AKI (acute kidney injury) (HCC)   Dehydration   Prolonged QT interval   Generalized weakness   Failure to thrive in adult   Hypoalbuminemia due to protein-calorie malnutrition (HCC)   Chronic respiratory failure with hypoxia (HCC)  Acute metabolic encephalopathy Patient was reported to be hallucinating at home, this may be due to multifactorial including dehydration, uremic encephalopathy etc. IV NS x 1 was given, continue IV hydration  Acute kidney injury Dehydration BUN/creatinine 66/3.78 (baseline creatinine at  2.1-2.5). Continue gentle hydration Renally adjust medications, avoid nephrotoxic agents/dehydration/hypotension  Generalized weakness and failure to thrive in adult Continue PT/OT eval and treat Dietitian will be consulted and we will await further recommendation  Prolonged QT interval QTc 510 ms Avoid QT prolonging drugs Magnesium  level will be checked Repeat EKG in the morning  Hypoalbuminemia possibly secondary to mild protein calorie malnutrition Albumin 3.1, protein supplement will be provided  Chronic respiratory failure with hypoxia Continue supplemental oxygen  to maintain O2 sat > 92% per home regimen  COPD (not in acute exacerbation) Continue Spiriva   BPH Continue Flomax   Chronic diastolic CHF Stable. 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 The patient is status post initial course of concurrent chemoradiation with weekly carboplatin  and paclitaxel  for 6 cycles with partial response and he was treated with first-line treatment with immunotherapy with Libtayo  (Cempilimab) 350 Mg IV every 3 weeks status post 32 cycles complicated with immunotherapy mediated vitiligo and it was discontinued secondary to worsening chronic kidney disease.  Patient is now on observation and is stable Patient follows with Dr. Gatha  DVT prophylaxis: Heparin  subcu  Code Status: DNR  Family Communication: Daughters at bedside (all questions answered to satisfaction)  Consults: Dietitian, palliative care  Severity of Illness: The appropriate patient status for this patient is  INPATIENT. Inpatient status is judged to be reasonable and necessary in order to provide the required intensity of service to ensure the patient's safety. The patient's presenting symptoms, physical exam findings, and initial radiographic and laboratory data in the context of their chronic comorbidities is felt to place them at high risk for further clinical deterioration. Furthermore, it is not anticipated that the patient will be medically stable for discharge from the hospital within 2 midnights of admission.   * I certify that at the point of admission it is my clinical judgment that the patient will require inpatient hospital care spanning beyond 2 midnights from the point of admission due to high intensity of service, high risk for further deterioration and high frequency of surveillance required.*  Author: Sten Dematteo, DO 10/07/2023 9:35 PM  For on call review [email or web address].

## 2023-10-07 NOTE — ED Notes (Addendum)
 Bladder scanner revealed 275 mL in bladder--PA-C made aware

## 2023-10-07 NOTE — ED Notes (Signed)
 Pt did arrive with DNR paperwork from home

## 2023-10-07 NOTE — ED Provider Notes (Signed)
 [facility] AT [facility] Provider Note   CSN: [id number] Arrival date & time: 10/07/23  1432     History  Chief Complaint  Patient presents with   Weakness    [name] is a 89 y.o. male.  Patient with history of lung cancer not currently undergoing treatment on 2 L of oxygen  via nasal cannula at all times, CKD, COPD, CHF presents today with family for concern of altered mental status.  According to patient's daughters at bedside, patient lives alone and fully cares for himself at baseline.  Over the last 2 days he has gone from walking without assistance to being unable to get out of bed.  He is also gone from being completely alert and oriented to only being able to identify himself.  He has not been eating very much over the last few days.  He has also been hallucinating, seeing bugs crawling on him that are not there.  Family denies any history of similar symptoms previously.  Given patient's current mental status, all history provided by family at bedside.  He has not had any complaints.   Level 5 caveat -- altered mental status  The history is provided by the patient. No language interpreter was used.  Weakness      Home Medications Prior to Admission medications   Medication Sig Start Date End Date Taking? Authorizing Provider  amLODipine  (NORVASC ) 10 MG tablet Take 1 tablet (10 mg total) by mouth daily. Hold for blood pressure reading <130 09/01/23  Yes Landy Barnie RAMAN, NP  buPROPion  ER (WELLBUTRIN  SR) 100 MG 12 hr tablet Take 100 mg by mouth 2 (two) times daily.   Yes [provider]  calcium  carbonate (OSCAL) 1500 (600 Ca) MG TABS tablet Take 500 mg by mouth daily with breakfast. For calcium  supplement   Yes [provider]  ferrous sulfate  325 (65 FE) MG EC tablet Take 1 tablet (325 mg total) by mouth 3 (three) times a week. 09/01/23  Yes Landy Barnie RAMAN, NP  fluticasone -salmeterol (ADVAIR) 250-50 MCG/ACT AEPB  Inhale 1 puff into the lungs in the morning and at bedtime. Patient taking differently: Inhale 1 puff into the lungs daily as needed (wheezing, shortness of breath). 09/01/23  Yes Landy Barnie RAMAN, NP  furosemide  (LASIX ) 40 MG tablet Take 1 tablet (40 mg total) by mouth daily. 09/01/23  Yes Landy Barnie RAMAN, NP  gabapentin  (NEURONTIN ) 250 MG/5ML solution Take 3 mLs (150 mg total) by mouth at bedtime. 09/01/23  Yes Landy Barnie RAMAN, NP  labetalol  (NORMODYNE ) 200 MG tablet Take 1 tablet (200 mg total) by mouth 2 (two) times daily. Hold for blood pressure reading <130 09/01/23  Yes Landy Barnie RAMAN, NP  levothyroxine  (SYNTHROID ) 150 MCG tablet Take 1 tablet (150 mcg total) by mouth daily. 09/01/23  Yes Landy Barnie RAMAN, NP  Protein (PROSOURCE) PACK Take 1 each by mouth 3 (three) times daily. Patient taking differently: Take 1 each by mouth 3 (three) times daily. Now Ensures at home 09/01/23  Yes Landy Barnie RAMAN, NP  tadalafil  (CIALIS ) 5 MG tablet Take 1 tablet (5 mg total) by mouth daily. 09/01/23  Yes Landy Barnie RAMAN, NP  tamsulosin  (FLOMAX ) 0.4 MG CAPS capsule Take 1 capsule (0.4 mg total) by mouth daily. 09/01/23  Yes Landy Barnie RAMAN, NP  Tiotropium Bromide  Monohydrate (SPIRIVA  RESPIMAT) 1.25 MCG/ACT AERS Inhale 2 puffs into the lungs daily at 2 PM. 09/01/23  Yes Landy Barnie RAMAN, NP  umeclidinium bromide  (  INCRUSE ELLIPTA ) 62.5 MCG/ACT AEPB Inhale 1 puff into the lungs daily.   Yes [provider]      Allergies    Patient has no known allergies.    Review of Systems   Review of Systems  Unable to perform ROS: Mental status change  Neurological:  Positive for weakness.    Physical Exam Updated Vital Signs BP 126/62   Pulse 68   Temp 97.7 F (36.5 C) (Oral)   Resp 14   SpO2 99%  Physical Exam Vitals and nursing note reviewed.  Constitutional:      General: He is not in acute distress.    Appearance: Normal appearance. He is normal weight. He is not toxic-appearing or diaphoretic.      Comments: Cachectic and generally unwell appearing  HENT:     Head: Normocephalic and atraumatic.  Eyes:     Extraocular Movements: Extraocular movements intact.     Pupils: Pupils are equal, round, and reactive to light.  Cardiovascular:     Rate and Rhythm: Normal rate.  Pulmonary:     Effort: Pulmonary effort is normal. No respiratory distress.     Comments: On 2 L O2 via nasal cannula Abdominal:     General: Abdomen is flat.     Palpations: Abdomen is soft.     Tenderness: There is no abdominal tenderness.  Musculoskeletal:        General: Normal range of motion.     Cervical back: Normal range of motion.     Right lower leg: No edema.     Left lower leg: No edema.  Skin:    General: Skin is warm and dry.  Neurological:     General: No focal deficit present.     Mental Status: He is alert.     Comments: Alert to self only, cannot identify family at bedside or where he is or the year. Follows commands, moving extremities equally.  Psychiatric:        Mood and Affect: Mood normal.        Behavior: Behavior normal.     ED Results / Procedures / Treatments   Labs (all labs ordered are listed, but only abnormal results are displayed) Labs Reviewed  COMPREHENSIVE METABOLIC PANEL - Abnormal; Notable for the following components:      Result Value   Sodium 134 (*)    BUN 66 (*)    Creatinine, Ser 3.78 (*)    Calcium  8.7 (*)    Albumin 3.1 (*)    GFR, Estimated 15 (*)    All other components within normal limits  CBC WITH DIFFERENTIAL/PLATELET - Abnormal; Notable for the following components:   RBC 3.60 (*)    Hemoglobin 10.5 (*)    HCT 32.5 (*)    RDW 16.3 (*)    Lymphs Abs 0.4 (*)    All other components within normal limits  BLOOD GAS, VENOUS - Abnormal; Notable for the following components:   pCO2, Ven 43 (*)    pO2, Ven <31 (*)    All other components within normal limits  AMMONIA  LACTIC ACID, PLASMA  URINALYSIS, W/ REFLEX TO CULTURE (INFECTION  SUSPECTED)  CBG MONITORING, ED    EKG None  Radiology CT HEAD WO CONTRAST Result Date: 10/07/2023 CLINICAL DATA:  Mental status change, unknown cause EXAM: CT HEAD WITHOUT CONTRAST TECHNIQUE: Contiguous axial images were obtained from the base of the skull through the vertex without intravenous contrast. RADIATION DOSE REDUCTION: This exam was  performed according to the departmental dose-optimization program which includes automated exposure control, adjustment of the mA and/or kV according to patient size and/or use of iterative reconstruction technique. COMPARISON:  Brain MR 01/02/21 FINDINGS: Brain: No hemorrhage. No hydrocephalus. No extra-axial fluid collection. No CT evidence of an acute cortical infarct. No mass effect. No mass lesion. There is a background of mild chronic microvascular ischemic change. Vascular: No hyperdense vessel or unexpected calcification. Skull: Normal. Negative for fracture or focal lesion. Sinuses/Orbits: No middle ear or mastoid effusion. Paranasal sinuses clear. Orbits are unremarkable. Other: None. IMPRESSION: No CT etiology for altered mental status identified. Electronically Signed   By: Lyndall Gore M.D.   On: 10/07/2023 16:15   DG Chest Port 1 View Result Date: 10/07/2023 CLINICAL DATA:  Altered mental status EXAM: PORTABLE CHEST 1 VIEW COMPARISON:  07/07/2023, PET CT 08/31/2023, chest x-ray 12/12/2020, CT 01/27/2023 FINDINGS: Emphysema. Stable bandlike scarring in the right upper lung with distortion along the right upper lobe pleuroparenchymal surface and consistent with scarring. Right hilar fullness and distortion, no change and presumably due to post therapeutic change, no findings suspicious for recurrence on interval PET CT 08/31/2023. stable cardiomediastinal silhouette with aortic atherosclerosis. No pneumothorax IMPRESSION: No active disease. Emphysema with right upper lobe/parahilar scarring and distortion as seen on prior exam. Electronically Signed   By:  Luke Bun M.D.   On: 10/07/2023 15:56    Procedures .Critical Care  Performed by: Nora Lauraine LABOR, PA-C Authorized by: Anelise Staron A, PA-C   Critical care provider statement:    Critical care time (minutes):  35   Critical care was necessary to treat or prevent imminent or life-threatening deterioration of the following conditions: uremic encephalopathy.   Critical care was time spent personally by me on the following activities:  Development of treatment plan with patient or surrogate, discussions with primary provider, evaluation of patient's response to treatment, examination of patient, obtaining history from patient or surrogate, ordering and review of laboratory studies, ordering and review of radiographic studies, pulse oximetry, re-evaluation of patient's condition and review of old charts   Care discussed with: admitting provider       Medications Ordered in ED Medications  sodium chloride  0.9 % bolus 1,000 mL (1,000 mLs Intravenous Bolus 10/07/23 1809)    ED Course/ Medical Decision Making/ A&P                                 Medical Decision Making Amount and/or Complexity of Data Reviewed Labs: ordered. Radiology: ordered.  Risk Decision regarding hospitalization.   This patient is a 89 y.o. male who presents to the ED for concern of altered mental status, this involves an extensive number of treatment options, and is a complaint that carries with it a high risk of complications and morbidity. The emergent differential diagnosis prior to evaluation includes, but is not limited to,    Drug-related, hypoxia, hyper/hypoglycemia, encephalopathy, sepsis, DKA/HHS, brain lesion, CVA, seizure, environmental, psychiatric  This is not an exhaustive differential.   Past Medical History / Co-morbidities / Social History:  has a past medical history of Arthritis, Hypertension, Hyperthyroidism, and lung ca (11/2020).  Additional history: Chart reviewed. Pertinent results  include: stage IV small cell lung cancer status post current chemotherapy and immunotherapy not currently undergoing any treatment.  Most recent imaging did show disease recurrence.  Plan at most recent oncology visit was to have a repeat CT scan, does  not appear he has had that yet.  Physical Exam: Physical exam performed. The pertinent findings include: Patient chronically ill and generally unwell appearing, cachectic, on 2 L of oxygen  via nasal cannula.  He is alert and oriented to test self only, and able to identify family members at the bedside.  He does follow commands and moves extremities equally.  PERRLA and EOMs intact  Lab Tests: I ordered, and personally interpreted labs.  The pertinent results include:  hgb 10.5 consistent with baseline, Na 134. BUN 66, creatinine 3.78 up from 2.54 1 month ago. Ammonia WNL. No acidosis on vbg. Lactic WNL.  UA pending.   Imaging Studies: I ordered imaging studies including Ct head, CXR. I independently visualized and interpreted imaging which showed   CXR:  No active disease. Emphysema with right upper lobe/parahilar scarring and distortion as seen on prior exam.  CT: NAD  I agree with the radiologist interpretation.   Cardiac Monitoring:  The patient was maintained on a cardiac monitor.  My attending physician Dr. Yolande viewed and interpreted the cardiac monitored which showed an underlying rhythm of: no STEMI. I agree with this interpretation.   Medications: I ordered medication including fluids  for dehydration. Reevaluation of the patient after these medicines showed that the patient improved. I have reviewed the patients home medicines and have made adjustments as needed.   Disposition: After consideration of the diagnostic results and the patients response to treatment, I feel that patient will require admission for altered mental status due to suspected uremic encephalopathy due to AKI likely attributed to dehydration.  Discussed with  patient and family at the bedside who are understanding and in agreement with this plan.  Discussed patient with hospitalist Dr. Adefeso who accepts patient for admission.   I discussed this case with my attending physician Dr. Yolande who cosigned this note including patient's presenting symptoms, physical exam, and planned diagnostics and interventions. Attending physician stated agreement with plan or made changes to plan which were implemented.    Final Clinical Impression(s) / ED Diagnoses Final diagnoses:  Uremic encephalopathy  AKI (acute kidney injury) [facility])  Dehydration    Rx / DC Orders ED Discharge Orders     None         Nora Lauraine DELENA DEVONNA 10/07/23 2011    Yolande Lamar BROCKS, MD 10/12/23 1218

## 2023-10-07 NOTE — ED Notes (Signed)
 Pt incontinent of urine, brief changed per NT

## 2023-10-08 DIAGNOSIS — G9341 Metabolic encephalopathy: Secondary | ICD-10-CM | POA: Diagnosis not present

## 2023-10-08 LAB — GLUCOSE, CAPILLARY
Glucose-Capillary: 65 mg/dL — ABNORMAL LOW (ref 70–99)
Glucose-Capillary: 93 mg/dL (ref 70–99)
Glucose-Capillary: 91 mg/dL (ref 70–99)
Glucose-Capillary: 118 mg/dL — ABNORMAL HIGH (ref 70–99)
Glucose-Capillary: 107 mg/dL — ABNORMAL HIGH (ref 70–99)

## 2023-10-08 LAB — COMPREHENSIVE METABOLIC PANEL
ALT: 13 U/L (ref 0–44)
AST: 31 U/L (ref 15–41)
Albumin: 2.9 g/dL — ABNORMAL LOW (ref 3.5–5.0)
Alkaline Phosphatase: 69 U/L (ref 38–126)
Anion gap: 12 (ref 5–15)
BUN: 65 mg/dL — ABNORMAL HIGH (ref 8–23)
CO2: 19 mmol/L — ABNORMAL LOW (ref 22–32)
Calcium: 8.5 mg/dL — ABNORMAL LOW (ref 8.9–10.3)
Chloride: 102 mmol/L (ref 98–111)
Creatinine, Ser: 3.29 mg/dL — ABNORMAL HIGH (ref 0.61–1.24)
GFR, Estimated: 17 mL/min — ABNORMAL LOW (ref >60)
Glucose, Bld: 57 mg/dL — ABNORMAL LOW (ref 70–99)
Potassium: 3.7 mmol/L (ref 3.5–5.1)
Sodium: 133 mmol/L — ABNORMAL LOW (ref 135–145)
Total Bilirubin: 0.9 mg/dL (ref 0.0–1.2)
Total Protein: 7.3 g/dL (ref 6.5–8.1)

## 2023-10-08 LAB — CBC
HCT: 31.6 % — ABNORMAL LOW (ref 39.0–52.0)
Hemoglobin: 10.1 g/dL — ABNORMAL LOW (ref 13.0–17.0)
MCH: 28.8 pg (ref 26.0–34.0)
MCHC: 32 g/dL (ref 30.0–36.0)
MCV: 90 fL (ref 80.0–100.0)
Platelets: 179 10*3/uL (ref 150–400)
RBC: 3.51 MIL/uL — ABNORMAL LOW (ref 4.22–5.81)
RDW: 16.2 % — ABNORMAL HIGH (ref 11.5–15.5)
WBC: 5.1 10*3/uL (ref 4.0–10.5)
nRBC: 0 % (ref 0.0–0.2)

## 2023-10-08 LAB — URINALYSIS, W/ REFLEX TO CULTURE (INFECTION SUSPECTED)
Bilirubin Urine: NEGATIVE
Glucose, UA: NEGATIVE mg/dL
Ketones, ur: 5 mg/dL — AB
Nitrite: POSITIVE — AB
Protein, ur: 100 mg/dL — AB
Specific Gravity, Urine: 1.012 (ref 1.005–1.030)
WBC, UA: >50 WBC/hpf (ref 0–5)
pH: 8 (ref 5.0–8.0)

## 2023-10-08 LAB — MAGNESIUM: Magnesium: 1.8 mg/dL (ref 1.7–2.4)

## 2023-10-08 LAB — PHOSPHORUS: Phosphorus: 3 mg/dL (ref 2.5–4.6)

## 2023-10-08 MED ORDER — ADULT MULTIVITAMIN W/MINERALS CH
1.0000 | ORAL_TABLET | Freq: Every day | ORAL | Status: DC
  Administered 2023-10-08 – 2023-10-11 (×4): 1 via ORAL
  Filled 2023-10-08 (×4): qty 1

## 2023-10-08 MED ORDER — SODIUM CHLORIDE 0.9 % IV SOLN: INTRAVENOUS | Status: AC

## 2023-10-08 MED ORDER — ENSURE ENLIVE PO LIQD
237.0000 mL | Freq: Three times a day (TID) | ORAL | Status: DC
  Administered 2023-10-08 – 2023-10-11 (×7): 237 mL via ORAL

## 2023-10-08 MED ORDER — AMLODIPINE BESYLATE 5 MG PO TABS
10.0000 mg | ORAL_TABLET | Freq: Every day | ORAL | Status: DC
  Administered 2023-10-08 – 2023-10-11 (×4): 10 mg via ORAL
  Filled 2023-10-08 (×4): qty 2

## 2023-10-08 MED ORDER — LEVOTHYROXINE SODIUM 75 MCG PO TABS
150.0000 ug | ORAL_TABLET | Freq: Every day | ORAL | Status: DC
Start: 2023-10-08 — End: 2023-10-11
  Administered 2023-10-08 – 2023-10-11 (×4): 150 ug via ORAL
  Filled 2023-10-08 (×4): qty 2

## 2023-10-08 MED ORDER — TIOTROPIUM BROMIDE MONOHYDRATE 1.25 MCG/ACT IN AERS: 2.0000 | INHALATION_SPRAY | Freq: Every day | RESPIRATORY_TRACT | Status: DC

## 2023-10-08 MED ORDER — TAMSULOSIN HCL 0.4 MG PO CAPS
0.4000 mg | ORAL_CAPSULE | Freq: Every day | ORAL | Status: DC
  Administered 2023-10-08 – 2023-10-11 (×4): 0.4 mg via ORAL
  Filled 2023-10-08 (×4): qty 1

## 2023-10-08 MED ORDER — LABETALOL HCL 200 MG PO TABS
200.0000 mg | ORAL_TABLET | Freq: Two times a day (BID) | ORAL | Status: DC
  Administered 2023-10-08 – 2023-10-11 (×7): 200 mg via ORAL
  Filled 2023-10-08 (×8): qty 1

## 2023-10-08 MED ORDER — FERROUS SULFATE 325 (65 FE) MG PO TABS
325.0000 mg | ORAL_TABLET | ORAL | Status: DC
  Administered 2023-10-08 – 2023-10-11 (×2): 325 mg via ORAL
  Filled 2023-10-08 (×2): qty 1

## 2023-10-08 MED ORDER — UMECLIDINIUM BROMIDE 62.5 MCG/ACT IN AEPB
1.0000 | INHALATION_SPRAY | Freq: Every day | RESPIRATORY_TRACT | Status: DC
  Administered 2023-10-08 – 2023-10-11 (×4): 1 via RESPIRATORY_TRACT
  Filled 2023-10-08: qty 7

## 2023-10-08 NOTE — Progress Notes (Signed)
 PROGRESS NOTE    [name]  FMW:[id number] DOB: 1933-07-04 DOA: 10/07/2023 PCP: Brian Browning, FNP   Brief Narrative:    [name] is a 89 y.o. male with medical history significant of hypertension, hypothyroidism, lung cancer, chronic respiratory failure with hypoxia on supplemental oxygen  at 2 LPM via [place] who presents to the emergency department from home via EMS due to 2-day onset of generalized weakness and inability to stand (different from baseline way he was able to ambulate with a walker).  Patient was admitted with acute metabolic encephalopathy in the setting of AKI as well as generalized weakness with suspicion of failure to thrive.  Palliative consulted.  Assessment & Plan:   Principal Problem:   Acute metabolic encephalopathy Active Problems:   COPD (chronic obstructive pulmonary disease) (HCC)   Chronic diastolic CHF (congestive heart failure) (HCC)   Essential hypertension   Acquired hypothyroidism   Benign prostatic hyperplasia   Iron  deficiency anemia   AKI (acute kidney injury) (HCC)   Dehydration   Prolonged QT interval   Generalized weakness   Failure to thrive in adult   Hypoalbuminemia due to protein-calorie malnutrition (HCC)   Chronic respiratory failure with hypoxia (HCC)  Assessment and Plan:   Acute metabolic encephalopathy Patient was reported to be hallucinating at home, this may be due to multifactorial including dehydration, uremic encephalopathy etc. IV NS x 1 was given, continue IV hydration   Acute kidney injury-improving Dehydration BUN/creatinine 66/3.78 (baseline creatinine at 2.1-2.5). Continue gentle hydration Renally adjust medications, avoid nephrotoxic agents/dehydration/hypotension   Generalized weakness and failure to thrive in adult PT recommending SNF Dietitian will be consulted and we will await further recommendation   Prolonged QT interval QTc 510 ms Avoid QT prolonging drugs Magnesium  level will be  checked Repeat EKG in the morning   Hypoalbuminemia possibly secondary to mild protein calorie malnutrition Albumin 3.1, protein supplement will be provided   Chronic respiratory failure with hypoxia Continue supplemental oxygen  to maintain O2 sat > 92% per home regimen   COPD (not in acute exacerbation) Continue Spiriva    BPH Continue Flomax    Chronic diastolic CHF Stable. 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 The patient is status post initial course of concurrent chemoradiation with weekly carboplatin  and paclitaxel  for 6 cycles with partial response and he was treated with first-line treatment with immunotherapy with Libtayo  (Cempilimab) 350 Mg IV every 3 weeks status post 32 cycles complicated with immunotherapy mediated vitiligo and it was discontinued secondary to worsening chronic kidney disease.  Patient is now on observation and is stable Patient follows with Dr. Gatha    DVT prophylaxis:Heparin  Code Status: DNR Family Communication: Discussed with daughter on phone 1/10 Disposition Plan:  Status is: Inpatient Remains inpatient appropriate because: Need for IV fluid.  Consultants:  Palliative  Procedures:  None  Antimicrobials:  None   Subjective: Patient seen and evaluated today with no new acute complaints or concerns. No acute concerns or events noted overnight.  Objective: Vitals:   10/08/23 0453 10/08/23 0500 10/08/23 0604 10/08/23 0803  BP: 134/63   134/84  Pulse: 74   78  Resp: 18   20  Temp: 98.4 F (36.9 C)  98 F (36.7 C)  99.6 F (37.6 C)  TempSrc:    Axillary  SpO2: 100%   99%  Weight:  53.6 kg    Height:   6' 2.5 (1.892 m)     Intake/Output Summary (Last 24 hours) at 10/08/2023 1105 Last data filed at 10/08/2023 0603 Gross per 24 hour  Intake 1170.2 ml  Output 200 ml  Net 970.2 ml   Filed Weights   10/07/23 2056 10/08/23 0500  Weight: 57.2 kg 53.6 kg    Examination:  General exam: Appears calm and comfortable, appears thin/frail Respiratory system: Clear to auscultation. Respiratory effort normal. Cardiovascular system: S1 & S2 heard, RRR.  Gastrointestinal system: Abdomen is soft Central nervous system: Alert and awake Extremities: No edema Skin: No significant lesions noted Psychiatry: Flat affect.    Data Reviewed: I have personally reviewed following labs and imaging studies  CBC: Recent Labs  Lab 10/07/23 1604 10/08/23 0347  WBC 5.3 5.1  NEUTROABS 4.6  --   HGB 10.5* 10.1*  HCT 32.5* 31.6*  MCV 90.3 90.0  PLT 168 179   Basic Metabolic Panel: Recent Labs  Lab 10/07/23 1604 10/08/23 0347  NA 134* 133*  K 4.2 3.7  CL 98 102  CO2 22 19*  GLUCOSE 84 57*  BUN 66* 65*  CREATININE 3.78* 3.29*  CALCIUM  8.7* 8.5*  MG  --  1.8  PHOS  --  3.0   GFR: Estimated Creatinine Clearance: 11.8 mL/min (A) (by C-G formula based on SCr of 3.29 mg/dL (H)). Liver Function Tests: Recent Labs  Lab 10/07/23 1604 10/08/23 0347  AST 31 31  ALT 14 13  ALKPHOS 74 69  BILITOT 0.8 0.9  PROT 7.8 7.3  ALBUMIN 3.1* 2.9*   No results for input(s): LIPASE, AMYLASE in the last 168 hours. Recent Labs  Lab 10/07/23 1604  AMMONIA 13   Coagulation Profile: No results for input(s): INR, PROTIME in the last 168 hours. Cardiac Enzymes: No results for input(s): CKTOTAL, CKMB, CKMBINDEX, TROPONINI in the last 168 hours. BNP (last 3 results) No results for input(s): PROBNP in the last 8760 hours. HbA1C: No results for input(s): HGBA1C in the last 72  hours. CBG: Recent Labs  Lab 10/07/23 1546 10/08/23 0840 10/08/23 0937  GLUCAP 82 65* 93   Lipid Profile: No results for input(s): CHOL, HDL, LDLCALC, TRIG, CHOLHDL, LDLDIRECT in the last 72 hours. Thyroid  Function Tests: No results for input(s): TSH, T4TOTAL, FREET4, T3FREE, THYROIDAB in the last 72 hours. Anemia Panel: No results for input(s): VITAMINB12, FOLATE, FERRITIN, TIBC, IRON , RETICCTPCT in the last 72 hours. Sepsis Labs: Recent Labs  Lab 10/07/23 1604  LATICACIDVEN 1.5    No results found for this or any previous visit (from the past 240 hours).       Radiology Studies: CT HEAD WO CONTRAST Result Date: 10/07/2023 CLINICAL DATA:  Mental status change, unknown cause EXAM: CT HEAD WITHOUT CONTRAST TECHNIQUE: Contiguous axial images were obtained from the base of the skull through the vertex without intravenous contrast. RADIATION DOSE REDUCTION: This exam was performed according to the departmental dose-optimization program which includes automated exposure control, adjustment of the mA and/or kV according to patient size and/or use of iterative reconstruction technique. COMPARISON:  Brain MR 01/02/21 FINDINGS: Brain: No hemorrhage. No hydrocephalus. No extra-axial fluid collection. No CT evidence of an acute cortical infarct. No mass effect. No mass lesion. There is a background of mild  chronic microvascular ischemic change. Vascular: No hyperdense vessel or unexpected calcification. Skull: Normal. Negative for fracture or focal lesion. Sinuses/Orbits: No middle ear or mastoid effusion. Paranasal sinuses clear. Orbits are unremarkable. Other: None. IMPRESSION: No CT etiology for altered mental status identified. Electronically Signed   By: Lyndall Gore M.D.   On: 10/07/2023 16:15   DG Chest Port 1 View Result Date: 10/07/2023 CLINICAL DATA:  Altered mental status EXAM: PORTABLE CHEST 1 VIEW COMPARISON:  07/07/2023, PET CT 08/31/2023, chest  x-ray 12/12/2020, CT 01/27/2023 FINDINGS: Emphysema. Stable bandlike scarring in the right upper lung with distortion along the right upper lobe pleuroparenchymal surface and consistent with scarring. Right hilar fullness and distortion, no change and presumably due to post therapeutic change, no findings suspicious for recurrence on interval PET CT 08/31/2023. stable cardiomediastinal silhouette with aortic atherosclerosis. No pneumothorax IMPRESSION: No active disease. Emphysema with right upper lobe/parahilar scarring and distortion as seen on prior exam. Electronically Signed   By: Luke Bun M.D.   On: 10/07/2023 15:56        Scheduled Meds:  amLODipine   10 mg Oral Daily   feeding supplement  237 mL Oral BID BM   ferrous sulfate   325 mg Oral Once per day on Monday Wednesday Friday   heparin   5,000 Units Subcutaneous Q8H   labetalol   200 mg Oral BID   levothyroxine   150 mcg Oral Q0600   tamsulosin   0.4 mg Oral Daily   umeclidinium bromide   1 puff Inhalation Daily   Continuous Infusions:  sodium chloride  75 mL/hr at 10/08/23 0810     LOS: 1 day    Time spent: 55 minutes    Ricka Westra D Maree, DO [facility]  If 7PM-7AM, please contact night-coverage [email or web address] 10/08/2023, 11:05 AM

## 2023-10-08 NOTE — Plan of Care (Signed)
  Problem: Acute Rehab OT Goals (only OT should resolve) Goal: Pt. Will Perform Grooming Flowsheets (Taken 10/08/2023 1034) Pt Will Perform Grooming:  sitting  Independently Goal: Pt. Will Perform Upper Body Dressing Flowsheets (Taken 10/08/2023 1034) Pt Will Perform Upper Body Dressing:  sitting  with min assist Goal: Pt. Will Transfer To Toilet Flowsheets (Taken 10/08/2023 1034) Pt Will Transfer to Toilet:  with mod assist  stand pivot transfer  bedside commode    Chiquita Sermon, OTR/L

## 2023-10-08 NOTE — TOC Initial Note (Signed)
 Transition of Care [facility]) - Initial/Assessment Note    Patient Details  Name: [name] MRN: [id number] Date of Birth: December 21, 1932  Transition of Care [facility]) CM/SW Contact:    Lucie Lunger, LCSWA Phone Number: 10/08/2023, 12:47 PM  Clinical Narrative:                 Pt is high risk for readmission. PT is recommending SNF for pt at D/C. CSW spoke with pts daughter to review recommendation. Pt and family agreeable to SNF at this time. They would like referral sent to [facility] as pt has been to [facility] in the past. CSW to complete SNF referral and send out for review. TOC to follow.   Expected Discharge Plan: Skilled Nursing Facility Barriers to Discharge: Continued Medical Work up   Patient Goals and CMS Choice Patient states their goals for this hospitalization and ongoing recovery are:: go to SNF CMS Medicare.gov Compare Post Acute Care list provided to:: Patient Represenative (must comment) Choice offered to / list presented to : Adult Children [place] ownership interest in [facility].provided to:: Adult Children    Expected Discharge Plan and Services In-house Referral: Clinical Social Work Discharge Planning Services: CM Consult Post Acute Care Choice: Skilled Nursing Facility Living arrangements for the past 2 months: Single Family Home                                      Prior Living Arrangements/Services Living arrangements for the past 2 months: Single Family Home Lives with:: Relatives Patient language and need for interpreter reviewed:: Yes Do you feel safe going back to the place where you live?: Yes      Need for Family Participation in Patient Care: Yes (Comment) Care giver support system in place?: Yes (comment)   Criminal Activity/Legal Involvement Pertinent to Current Situation/Hospitalization: No - Comment as needed  Activities of Daily Living   ADL Screening (condition at time of admission) Independently performs ADLs?: No Does  the patient have a NEW difficulty with bathing/dressing/toileting/self-feeding that is expected to last >3 days?: Yes (Initiates electronic notice to provider for possible OT consult) Does the patient have a NEW difficulty with getting in/out of bed, walking, or climbing stairs that is expected to last >3 days?: Yes (Initiates electronic notice to provider for possible PT consult) Does the patient have a NEW difficulty with communication that is expected to last >3 days?: Yes (Initiates electronic notice to provider for possible SLP consult) Is the patient deaf or have difficulty hearing?: No Does the patient have difficulty seeing, even when wearing glasses/contacts?: No Does the patient have difficulty concentrating, remembering, or making decisions?: No  Permission Sought/Granted                  Emotional Assessment Appearance:: Appears stated age Attitude/Demeanor/Rapport: Engaged Affect (typically observed): Accepting   Alcohol / Substance Use: Not Applicable Psych Involvement: No (comment)  Admission diagnosis:  Dehydration [E86.0] 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, 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] AKI (acute kidney injury) (HCC) [N17.9] Patient Active Problem List   Diagnosis Date Noted   Acute metabolic encephalopathy 10/07/2023   Iron  deficiency anemia 10/07/2023   AKI (acute kidney injury) (HCC) 10/07/2023   Dehydration 10/07/2023   Prolonged QT interval 10/07/2023   Generalized weakness 10/07/2023   Failure to thrive in adult 10/07/2023   Hypoalbuminemia due to protein-calorie malnutrition (HCC) 10/07/2023   Chronic respiratory failure with hypoxia (HCC)  10/07/2023   HCAP (healthcare-associated pneumonia) 08/24/2023   Fecal occult blood test positive 08/20/2023   Normocytic anemia 08/20/2023   Hypotension 08/20/2023   Weight loss 08/09/2023   Situational depression 08/09/2023   Aortic atherosclerosis (HCC) 07/30/2023   Hypocalcemia 07/26/2023   Benign hypertension with coincident congestive  heart failure (HCC) 07/22/2023   Benign hypertension with chronic kidney disease, stage IV (HCC) 07/22/2023   Severe protein-calorie malnutrition (HCC) 07/22/2023   Chronic diastolic CHF (congestive heart failure) (HCC) 07/10/2023   Thyromegaly 07/08/2023   Acute respiratory failure with hypoxia (HCC) 07/08/2023   Peripheral edema 07/08/2023   Hypertensive crisis 07/08/2023   COPD (chronic obstructive pulmonary disease) (HCC) 07/07/2023   Penile discharge 05/06/2023   Vitiligo 12/17/2021   Chronic kidney disease, stage 4 (severe) (HCC) 07/18/2021   Essential hypertension 04/28/2021   Acquired hypothyroidism 04/28/2021   Underweight 04/28/2021   Chronic pain syndrome 04/28/2021   Carcinoma of prostate (HCC) 04/28/2021   Benign prostatic hyperplasia 04/28/2021   Osteoarthritis of knees, bilateral 04/28/2021   Dysphagia 04/10/2021   Anemia of chronic disease 02/10/2021   Cancer associated pain 12/23/2020   Primary cancer of right upper lobe of lung (HCC) 11/07/2020   PCP:  Joeann Browning, FNP Pharmacy:   [facility] [place], [place] - [address] [place] [address] Phone: [phone] Fax: [phone]     Social Drivers of Health (SDOH) Social History: SDOH Screenings   Food Insecurity: No Food Insecurity (10/07/2023)  Housing: Low Risk  (10/07/2023)  Transportation Needs: No Transportation Needs (10/07/2023)  Utilities: Not At Risk (10/07/2023)  Depression (PHQ2-9): Low Risk  (02/23/2023)  Social Connections: Unknown (10/08/2023)  Tobacco Use: Medium Risk (09/01/2023)   SDOH Interventions:     Readmission Risk Interventions    10/08/2023   12:45 PM  Readmission Risk Prevention Plan  Transportation Screening Complete  Medication Review (RN Care Manager) Complete  HRI or Home Care Consult Complete  SW Recovery Care/Counseling Consult Complete  Palliative Care Screening Not Applicable  Skilled Nursing Facility Complete

## 2023-10-08 NOTE — Evaluation (Addendum)
 Physical Therapy Evaluation Patient Details Name: [name] MRN: [id number] DOB: Dec 15, 1932 Today's Date: 10/08/2023  History of Present Illness  a 89 y.o. male with medical history significant of hypertension, hypothyroidism, lung cancer, chronic respiratory failure with hypoxia on supplemental oxygen  at 2 LPM via [place] who presents to the emergency department from home via EMS due to 2-day onset of generalized weakness and inability to stand (different from baseline way he was able to ambulate with a walker).  Patient was unable to provide history, history was obtained from ED PA and ED medical record.  Per report, patient was reported to be seeing things such as bugs and people that are not there.  Family was concerned that patient is dehydrated.  EMS was activated and patient was taken to the ED for further evaluation and management  Clinical Impression   Pt tolerated today's Physical Therapy Evaluation, well but with slow, labored movement and heavy posterior leaning and poor balancing throughout transfers. Capturing full and accurate PLOF mildly difficult to mild AMS. Pt lives with two daugthers and one is full time caregiver. Pt demonstrating significant limitations in bed mobility, transfers, ambulation and ADLs due to significant muscle weakness, balance deficits, postural deficits and reduced ROM. Based upon these deficits/impairments, patient will benefit from continued skilled physical therapy services during remainder of hospital stay and at the next recommended venue of care to address deficits and promote return to optimal function.                If plan is discharge home, recommend the following: A lot of help with walking and/or transfers;Two people to help with walking and/or transfers;A lot of help with bathing/dressing/bathroom;Two people to help with bathing/dressing/bathroom   Can travel by private vehicle        Equipment Recommendations None recommended by PT   Recommendations for Other Services       Functional Status Assessment Patient has had a recent decline in their functional status and demonstrates the ability to make significant improvements in function in a reasonable and predictable amount of time.     Precautions / Restrictions Precautions Precautions: Fall Restrictions Weight Bearing Restrictions Per Provider Order: No      Mobility  Bed Mobility Overal bed mobility: Needs Assistance Bed Mobility: Supine to Sit     Supine to sit: Contact guard, Min assist     General bed mobility comments: CGA with slow labored movement to EOB. Scooting anteriorly to EOB given min assist with tactile cues and facilitation for lateral trunk movements.    Transfers Overall transfer level: Needs assistance Equipment used: Rolling walker (2 wheels) Transfers: Sit to/from Stand, Bed to chair/wheelchair/BSC Sit to Stand: Max assist     Squat pivot transfers: Max assist     General transfer comment: Attempted sit/stand from EOB with RW, max assist given, heavy posterior lean with poor centering of mass in RW, pt controlled back to sitting. Performed lateral scoots to R side to prepare for squat pivot transfer to R side. max assist with cues for hand placement in recliner.    Ambulation/Gait               General Gait Details: Not indicated at this time.  Stairs            Wheelchair Mobility     Tilt Bed    Modified Rankin (Stroke Patients Only)       Balance Overall balance assessment: Needs assistance Sitting-balance support: Bilateral upper extremity supported,  Feet supported Sitting balance-Leahy Scale: Poor Sitting balance - Comments: needs assistance, heavy posterior lean. Postural control: Posterior lean Standing balance support: Bilateral upper extremity supported, During functional activity Standing balance-Leahy Scale: Poor Standing balance comment: max assist with transfer                              Pertinent Vitals/Pain Pain Assessment Pain Assessment: Faces Faces Pain Scale: Hurts little more Pain Location: RUE Pain Intervention(s): Monitored during session, Repositioned    Home Living Family/patient expects to be discharged to:: Private residence Living Arrangements: Alone;Other (Comment) Available Help at Discharge: Family;Available PRN/intermittently Type of Home: House Home Access: Level entry       Home Layout: One level Home Equipment: Agricultural Consultant (2 wheels);Cane - quad;Grab bars - tub/shower;BSC/3in1      Prior Function Prior Level of Function : Independent/Modified Independent             Mobility Comments: Pt reports that has been limited to 4 small steps with assist from daughter at home who is at home full time. ADLs Comments: min to mod assist with ADLs, reports independent sponge bathing and dressing. daugther lives at home full time and assists.     Extremity/Trunk Assessment        Lower Extremity Assessment Lower Extremity Assessment: Generalized weakness;RLE deficits/detail;LLE deficits/detail RLE Deficits / Details: 2+/5 MMT knee extension RLE Sensation: WNL LLE Deficits / Details: 3-/5 MMT knee extension LLE Sensation: WNL       Communication   Communication Communication: No apparent difficulties Cueing Techniques: Verbal cues;Tactile cues  Cognition Arousal: Alert Behavior During Therapy: WFL for tasks assessed/performed, Flat affect Overall Cognitive Status: Impaired/Different from baseline Area of Impairment: Orientation, Following commands                       Following Commands: Follows one step commands inconsistently, Follows multi-step commands inconsistently                General Comments      Exercises     Assessment/Plan    PT Assessment Patient needs continued PT services  PT Problem List Decreased strength;Decreased activity tolerance;Decreased balance;Decreased mobility        PT Treatment Interventions DME instruction;Gait training;Functional mobility training;Therapeutic activities;Therapeutic exercise;Balance training;Neuromuscular re-education    PT Goals (Current goals can be found in the Care Plan section)  Acute Rehab PT Goals Patient Stated Goal: get stronger PT Goal Formulation: With patient Time For Goal Achievement: 10/22/23 Potential to Achieve Goals: Good    Frequency Min 3X/week     Co-evaluation               AM-PAC PT 6 Clicks Mobility  Outcome Measure Help needed turning from your back to your side while in a flat bed without using bedrails?: A Little Help needed moving from lying on your back to sitting on the side of a flat bed without using bedrails?: A Little Help needed moving to and from a bed to a chair (including a wheelchair)?: A Lot Help needed standing up from a chair using your arms (e.g., wheelchair or bedside chair)?: A Lot Help needed to walk in hospital room?: A Lot Help needed climbing 3-5 steps with a railing? : Total 6 Click Score: 13    End of Session Equipment Utilized During Treatment: Gait belt Activity Tolerance: Patient tolerated treatment well Patient left: in chair;with chair alarm set;with call  bell/phone within reach Nurse Communication: Mobility status PT Visit Diagnosis: Unsteadiness on feet (R26.81);Muscle weakness (generalized) (M62.81)    Time: 9099-9078 PT Time Calculation (min) (ACUTE ONLY): 21 min   Charges:   PT Evaluation $PT Eval Low Complexity: 1 Low   PT General Charges $$ ACUTE PT VISIT: 1 Visit         Omega JONETTA Donna ALMETA, DPT [facility]- [place] 336 [phone] office  Omega JONETTA Donna 10/08/2023, 10:13 AM

## 2023-10-08 NOTE — NC FL2 (Signed)
 [place]  MEDICAID FL2 LEVEL OF CARE FORM     IDENTIFICATION  Patient Name: [name] Birthdate: 22-May-1933 Sex: male Admission Date (Current Location): 10/07/2023  [facility] and [place] Number:  [employer] and Address:  [facility]  [address] [place] [address]      Provider Number: [id number]  Attending Physician Name and Address:  Maree Adron BIRCH, DO  Relative Name and Phone Number:       Current Level of Care: Hospital Recommended Level of Care: Skilled Nursing Facility Prior Approval Number:    Date Approved/Denied:   PASRR Number: [id number]  Discharge Plan: SNF    Current Diagnoses: Patient Active Problem List   Diagnosis Date Noted   Acute metabolic encephalopathy 10/07/2023   Iron  deficiency anemia 10/07/2023   AKI (acute kidney injury) (HCC) 10/07/2023   Dehydration 10/07/2023   Prolonged QT interval 10/07/2023   Generalized weakness 10/07/2023   Failure to thrive in adult 10/07/2023   Hypoalbuminemia due to protein-calorie malnutrition (HCC) 10/07/2023   Chronic respiratory failure with hypoxia (HCC) 10/07/2023   HCAP (healthcare-associated pneumonia) 08/24/2023   Fecal occult blood test positive 08/20/2023   Normocytic anemia 08/20/2023   Hypotension 08/20/2023   Weight loss 08/09/2023   Situational depression 08/09/2023   Aortic atherosclerosis (HCC) 07/30/2023   Hypocalcemia 07/26/2023   Benign hypertension with coincident congestive heart failure (HCC) 07/22/2023   Benign hypertension with chronic kidney disease, stage IV (HCC) 07/22/2023   Severe protein-calorie malnutrition (HCC) 07/22/2023   Chronic diastolic CHF (congestive heart failure) (HCC) 07/10/2023   Thyromegaly 07/08/2023   Acute respiratory failure with hypoxia (HCC) 07/08/2023   Peripheral edema 07/08/2023   Hypertensive crisis 07/08/2023   COPD (chronic obstructive pulmonary disease) (HCC) 07/07/2023   Penile discharge 05/06/2023   Vitiligo  12/17/2021   Chronic kidney disease, stage 4 (severe) (HCC) 07/18/2021   Essential hypertension 04/28/2021   Acquired hypothyroidism 04/28/2021   Underweight 04/28/2021   Chronic pain syndrome 04/28/2021   Carcinoma of prostate (HCC) 04/28/2021   Benign prostatic hyperplasia 04/28/2021   Osteoarthritis of knees, bilateral 04/28/2021   Dysphagia 04/10/2021   Anemia of chronic disease 02/10/2021   Cancer associated pain 12/23/2020   Primary cancer of right upper lobe of lung (HCC) 11/07/2020    Orientation RESPIRATION BLADDER Height & Weight     Self, Time, Situation, Place  O2 (2L) Incontinent Weight: 118 lb 2.7 oz (53.6 kg) Height:  6' 2.5 (189.2 cm)  BEHAVIORAL SYMPTOMS/MOOD NEUROLOGICAL BOWEL NUTRITION STATUS      Incontinent Diet (Regular)  AMBULATORY STATUS COMMUNICATION OF NEEDS Skin   Extensive Assist Verbally Normal                       Personal Care Assistance Level of Assistance  Bathing, Feeding, Dressing Bathing Assistance: Limited assistance Feeding assistance: Independent Dressing Assistance: Limited assistance     Functional Limitations Info  Sight, Hearing, Speech Sight Info: Impaired Hearing Info: Adequate Speech Info: Adequate    SPECIAL CARE FACTORS FREQUENCY  PT (By licensed PT), OT (By licensed OT)     PT Frequency: 5 times weekly OT Frequency: 5 times weekly            Contractures Contractures Info: Not present    Additional Factors Info  Code Status, Allergies Code Status Info: DNR-Limited Allergies Info: NKA           Current Medications (10/08/2023):  This is the current hospital active medication list  Current Facility-Administered Medications  Medication Dose Route Frequency Provider Last Rate Last Admin   0.9 %  sodium chloride  infusion   Intravenous Continuous Shah, [name], DO 75 mL/hr at 10/08/23 0810 New Bag at 10/08/23 0810   acetaminophen  (TYLENOL ) tablet 650 mg  650 mg Oral Q6H PRN Adefeso, Oladapo, DO       Or    acetaminophen  (TYLENOL ) suppository 650 mg  650 mg Rectal Q6H PRN Adefeso, Oladapo, DO       amLODipine  (NORVASC ) tablet 10 mg  10 mg Oral Daily Adefeso, Oladapo, DO   10 mg at 10/08/23 9188   feeding supplement (ENSURE ENLIVE / ENSURE PLUS) liquid 237 mL  237 mL Oral TID BM Maree, [name], DO       ferrous sulfate  tablet 325 mg  325 mg Oral Once per day on Monday Wednesday Friday Adefeso, Oladapo, DO   325 mg at 10/08/23 9188   heparin  injection 5,000 Units  5,000 Units Subcutaneous Q8H Adefeso, Oladapo, DO   5,000 Units at 10/08/23 9488   labetalol  (NORMODYNE ) tablet 200 mg  200 mg Oral BID Adefeso, Oladapo, DO   200 mg at 10/08/23 0304   levothyroxine  (SYNTHROID ) tablet 150 mcg  150 mcg Oral Q0600 Adefeso, Oladapo, DO   150 mcg at 10/08/23 9491   multivitamin with minerals tablet 1 tablet  1 tablet Oral Daily Maree, [name], DO       prochlorperazine  (COMPAZINE ) injection 10 mg  10 mg Intravenous Q6H PRN Adefeso, Oladapo, DO       tamsulosin  (FLOMAX ) capsule 0.4 mg  0.4 mg Oral Daily Adefeso, Oladapo, DO   0.4 mg at 10/08/23 0810   umeclidinium bromide  (INCRUSE ELLIPTA ) 62.5 MCG/ACT 1 puff  1 puff Inhalation Daily Adefeso, Oladapo, DO   1 puff at 10/08/23 0813     Discharge Medications: Please see discharge summary for a list of discharge medications.  Relevant Imaging Results:  Relevant Lab Results:   Additional Information SSN: [id number], LCSWA

## 2023-10-08 NOTE — Progress Notes (Addendum)
 Initial Nutrition Assessment  DOCUMENTATION CODES:   Underweight  INTERVENTION:   Ensure Plus High Protein po TID, each supplement provides 350 kcal and 20 grams of protein. Magic cup TID with meals, each supplement provides 290 kcal and 9 grams of protein. MVI with minerals daily. Liberalize diet to regular.  NUTRITION DIAGNOSIS:   Inadequate oral intake related to decreased appetite as evidenced by per patient/family report, percent weight loss (24% weight loss x 3 months).  GOAL:   Patient will meet greater than or equal to 90% of their needs  MONITOR:   PO intake, Supplement acceptance  REASON FOR ASSESSMENT:   Consult Assessment of nutrition requirement/status  ASSESSMENT:   89 yo male admitted with acute metabolic encephalopathy, FTT. PMH includes HTN, hyperthyroidism, arthritis, lung cancer.  RD working remotely. Unable to obtain any nutrition hx at this time. Per malnutrition screening tool, patient has been eating poorly because of a decreased appetite.   Currently on a heart healthy diet. Meal intakes not recorded, but given BMI=15, suspected malnutrition, and reported decreased appetite, will liberalize diet to regular for increased meal options.   Labs reviewed. Na 133 CBG: 65-93  Medications reviewed and include ferrous sulfate , flomax . IVF: NS at 75 ml/h.  Weight history reviewed. Patient has lost 24% of his usual weight in the past 3 months.   Given hx of severe weight loss, low BMI, and reported poor intake PTA, suspect patient is malnourished. Unable to obtain enough information at this time for identification of malnutrition.   NUTRITION - FOCUSED PHYSICAL EXAM:  Unable to complete, RD working remotely  Diet Order:   Diet Order             Diet Heart Room service appropriate? Yes; Fluid consistency: Thin  Diet effective now                   EDUCATION NEEDS:   Not appropriate for education at this time  Skin:  Skin Assessment:  Reviewed RN Assessment  Last BM:  1/9  Height:   Ht Readings from Last 1 Encounters:  10/08/23 6' 2.5 (1.892 m)    Weight:   Wt Readings from Last 1 Encounters:  10/08/23 53.6 kg    Ideal Body Weight:  87.7 kg  BMI:  Body mass index is 14.97 kg/m.  Estimated Nutritional Needs:   Kcal:  1750-1950  Protein:  75-85 gm  Fluid:  1.75-2 L   [name] RD, LDN, CNSC Contact Inpatient RD using Secure Chat. If unavailable, use group chat RD Inpatient via Secure Chat in EPIC.

## 2023-10-08 NOTE — Plan of Care (Signed)
  Problem: Acute Rehab PT Goals(only PT should resolve) Goal: Pt Will Go Supine/Side To Sit Flowsheets (Taken 10/08/2023 1015) Pt will go Supine/Side to Sit: Independently Goal: Patient Will Perform Sitting Balance Flowsheets (Taken 10/08/2023 1015) Patient will perform sitting balance: Independently Goal: Patient Will Transfer Sit To/From Stand Flowsheets (Taken 10/08/2023 1015) Patient will transfer sit to/from stand: with contact guard assist Goal: Pt Will Transfer Bed To Chair/Chair To Bed Flowsheets (Taken 10/08/2023 1015) Pt will Transfer Bed to Chair/Chair to Bed: with contact guard assist Goal: Pt Will Perform Standing Balance Or Pre-Gait Flowsheets (Taken 10/08/2023 1015) Pt will perform standing balance or pre-gait: with contact guard assist Goal: Pt Will Ambulate Flowsheets (Taken 10/08/2023 1015) Pt will Ambulate:  15 feet  with contact guard assist  with rolling walker  Omega JONETTA Bottcher PT, DPT [facility]-  [phone] office

## 2023-10-08 NOTE — Evaluation (Signed)
 Occupational Therapy Evaluation Patient Details Name: [name] MRN: [id number] DOB: 1933-01-01 Today's Date: 10/08/2023   History of Present Illness a 89 y.o. male with medical history significant of hypertension, hypothyroidism, lung cancer, chronic respiratory failure with hypoxia on supplemental oxygen  at 2 LPM via [place] who presents to the emergency department from home via EMS due to 2-day onset of generalized weakness and inability to stand (different from baseline way he was able to ambulate with a walker).  Patient was unable to provide history, history was obtained from ED PA and ED medical record.  Per report, patient was reported to be seeing things such as bugs and people that are not there.  Family was concerned that patient is dehydrated.  EMS was activated and patient was taken to the ED for further evaluation and management   Clinical Impression   Pt agreeable to OT/PT evaluation. He reports that prior to admission he required min/mod assist to ADLS and used a W/C for mobility. He reports that he lives with his 2 daughters, one is available 24/7 for assist. Pt demonstrates deficits in RUE A/ROM and strength- 50% available due to pain. Pt required CGA for bed mobility and was able to maintain balance for a few seconds before losin balance and requiring mod assist for sitting EOB. Pt able to laterally scoot across bed with min assist. Attempted sit to stand t/f but did not progress standin mobility due to max A. Max assist required for squat pivot t/f to chair. Pt left in chair with call bell in reach and chair alarm set. Pt would benefit from OT while in acute care setting.       If plan is discharge home, recommend the following: A lot of help with walking and/or transfers;A lot of help with bathing/dressing/bathroom;Assistance with cooking/housework;Direct supervision/assist for medications management;Assist for transportation;Help with stairs or ramp for entrance     Functional Status Assessment  Patient has had a recent decline in their functional status and demonstrates the ability to make significant improvements in function in a reasonable and predictable amount of time.  Equipment Recommendations  None recommended by OT       Precautions / Restrictions Precautions Precautions: Fall Restrictions Weight Bearing Restrictions Per Provider Order: No      Mobility Bed Mobility Overal bed mobility: Needs Assistance Bed Mobility: Supine to Sit     Supine to sit: Contact guard, Min assist     General bed mobility comments: CGA with slow labored movement to EOB. Scooting anteriorly to EOB given min assist with tactile cues and facilitation for lateral trunk movements.    Transfers Overall transfer level: Needs assistance Equipment used: Rolling walker (2 wheels) Transfers: Sit to/from Stand, Bed to chair/wheelchair/BSC Sit to Stand: Max assist   Squat pivot transfers: Max assist       General transfer comment: Attempted sit/stand from EOB with RW, max assist given, heavy posterior lean with poor centering of mass in RW, pt controlled back to sitting. Performed lateral scoots to R side to prepare for squat pivot transfer to R side. max assist with cues for hand placement in recliner.      Balance Overall balance assessment: Needs assistance Sitting-balance support: Bilateral upper extremity supported, Feet supported Sitting balance-Leahy Scale: Poor Sitting balance - Comments: needs assistance, heavy posterior lean. Postural control: Posterior lean Standing balance support: Bilateral upper extremity supported, During functional activity Standing balance-Leahy Scale: Poor Standing balance comment: max assist with transfer  ADL either performed or assessed with clinical judgement   ADL Overall ADL's : Needs assistance/impaired Eating/Feeding: Set up;Sitting   Grooming: Set up;Sitting   Upper  Body Bathing: Minimal assistance;Sitting   Lower Body Bathing: Moderate assistance;Sitting/lateral leans   Upper Body Dressing : Minimal assistance;Sitting   Lower Body Dressing: Moderate assistance;Sitting/lateral leans   Toilet Transfer: Maximal assistance;BSC/3in1;Rolling walker (2 wheels)   Toileting- Clothing Manipulation and Hygiene: Maximal assistance;Sitting/lateral lean   Tub/ Shower Transfer: Maximal assistance;Tub bench;Rolling walker (2 wheels)   Functional mobility during ADLs: Maximal assistance;Rolling walker (2 wheels)       Vision Baseline Vision/History: 1 Wears glasses Ability to See in Adequate Light:  (pt reports blurry vision in left eye over the last 3 months) Patient Visual Report: Blurring of vision (L eye blurred vision over the last 3 months)              Pertinent Vitals/Pain Pain Assessment Faces Pain Scale: Hurts little more Pain Location: RUE Pain Intervention(s): Limited activity within patient's tolerance, Monitored during session     Extremity/Trunk Assessment Upper Extremity Assessment Upper Extremity Assessment: RUE deficits/detail;Generalized weakness RUE Deficits / Details: 50% ROM with flexion and abduction and reports pain RUE: Unable to fully assess due to pain;Shoulder pain with ROM RUE Sensation: WNL   Lower Extremity Assessment Lower Extremity Assessment: Defer to PT evaluation RLE Deficits / Details: 2+/5 MMT knee extension RLE Sensation: WNL LLE Deficits / Details: 3-/5 MMT knee extension LLE Sensation: WNL   Cervical / Trunk Assessment Cervical / Trunk Assessment: Normal   Communication Communication Communication: No apparent difficulties Cueing Techniques: Verbal cues;Tactile cues   Cognition Arousal: Alert Behavior During Therapy: WFL for tasks assessed/performed, Flat affect Overall Cognitive Status: Impaired/Different from baseline Area of Impairment: Orientation, Following commands                        Following Commands: Follows one step commands inconsistently, Follows multi-step commands inconsistently                        Home Living Family/patient expects to be discharged to:: Private residence Living Arrangements: Alone;Other (Comment) Available Help at Discharge: Family;Available PRN/intermittently Type of Home: House Home Access: Level entry     Home Layout: One level     Bathroom Shower/Tub: Chief Strategy Officer: Handicapped height Bathroom Accessibility: Yes How Accessible: Accessible via walker Home Equipment: Rolling Walker (2 wheels);Cane - quad;Grab bars - tub/shower;BSC/3in1          Prior Functioning/Environment Prior Level of Function : Needs assist       Physical Assist : Mobility (physical);ADLs (physical) Mobility (physical): Transfers ADLs (physical): Bathing;Dressing;Toileting Mobility Comments: Pt reports that has been limited to 4 small steps with assist from daughter at home who is at home full time. ADLs Comments: min to mod assist with ADLs, reports independent sponge bathing and dressing. daugther lives at home full time and assists.        OT Problem List: Decreased strength;Decreased range of motion;Decreased activity tolerance;Impaired balance (sitting and/or standing);Decreased safety awareness      OT Treatment/Interventions: Self-care/ADL training;Therapeutic exercise;DME and/or AE instruction;Therapeutic activities;Patient/family education    OT Goals(Current goals can be found in the care plan section) Acute Rehab OT Goals Patient Stated Goal: to go home OT Goal Formulation: With patient Time For Goal Achievement: 10/22/23 Potential to Achieve Goals: Fair ADL Goals Pt Will Perform Grooming: sitting;Independently Pt Will Perform  Upper Body Dressing: sitting;with min assist Pt Will Transfer to Toilet: with mod assist;stand pivot transfer;bedside commode  OT Frequency: Min 2X/week    Co-evaluation  PT/OT/SLP Co-Evaluation/Treatment: Yes Reason for Co-Treatment: To address functional/ADL transfers   OT goals addressed during session: ADL's and self-care      AM-PAC OT 6 Clicks Daily Activity     Outcome Measure Help from another person eating meals?: A Little Help from another person taking care of personal grooming?: A Little Help from another person toileting, which includes using toliet, bedpan, or urinal?: A Lot Help from another person bathing (including washing, rinsing, drying)?: A Lot Help from another person to put on and taking off regular upper body clothing?: A Little Help from another person to put on and taking off regular lower body clothing?: A Lot 6 Click Score: 15   End of Session Equipment Utilized During Treatment: Gait belt;Rolling walker (2 wheels);Oxygen  Nurse Communication: Mobility status  Activity Tolerance: Patient tolerated treatment well Patient left: in chair;with call bell/phone within reach;with chair alarm set  OT Visit Diagnosis: Unsteadiness on feet (R26.81);Muscle weakness (generalized) (M62.81)                Time: 9099-9076 OT Time Calculation (min): 23 min Charges:  OT General Charges $OT Visit: 1 Visit OT Evaluation $OT Eval Moderate Complexity: 1 Mod   Chiquita LOISE Sermon, OTR/L 10/08/2023, 10:37 AM

## 2023-10-08 NOTE — Evaluation (Signed)
 Clinical/Bedside Swallow Evaluation Patient Details  Name: [name] MRN: [id number] Date of Birth: Mar 02, 1933  Today's Date: 10/08/2023 Time: SLP Start Time (ACUTE ONLY): 1402 SLP Stop Time (ACUTE ONLY): 1424 SLP Time Calculation (min) (ACUTE ONLY): 22 min  Past Medical History:  Past Medical History:  Diagnosis Date   Arthritis    Hypertension    Hyperthyroidism    lung ca 11/2020   Past Surgical History:  Past Surgical History:  Procedure Laterality Date   COLONOSCOPY N/A 09/02/2016   Procedure: COLONOSCOPY;  Surgeon: Claudis RAYMOND Rivet, MD;  Location: [facility];  Service: Endoscopy;  Laterality: N/A;  830   NO PAST SURGERIES     POLYPECTOMY  09/02/2016   Procedure: POLYPECTOMY;  Surgeon: Claudis RAYMOND Rivet, MD;  Location: [facility];  Service: Endoscopy;;   HPI:  [name] is a 89 y.o. male with medical history significant of hypertension, hypothyroidism, lung cancer, chronic respiratory failure with hypoxia on supplemental oxygen  at 2 LPM via [place] who presents to the emergency department from home via EMS due to 2-day onset of generalized weakness and inability to stand (different from baseline way he was able to ambulate with a walker).  Patient was unable to provide history, history was obtained from ED PA and ED medical record.  Per report, patient was reported to be seeing things such as bugs and people that are not there.  Family was concerned that patient is dehydrated.  EMS was activated and patient was taken to the ED for further evaluation and management. BSE requested    Assessment / Plan / Recommendation  Clinical Impression  Clinical swallowing evaluation completed while Pt was sitting upright in bed; Pt consumed all consistencies/textures without overt s/sx of oropharyngeal dysphagia. Pt reports little appetite but Patient and daughter, present for eval, deny s/sx of dysphagia. Pt reportedly lost his dentures recently but does very well masticating without  them. Pt thoroughly masticated regular requiring some additional time but thoroughly did so followed by a timely swallow. Recommend continue with a regular diet and thin liquids. Meds are ok whole with liquids, there are no further ST needs at this time, our service will sign off, thank you for this referral. SLP Visit Diagnosis: Dysphagia, unspecified (R13.10)    Aspiration Risk  Risk for inadequate nutrition/hydration    Diet Recommendation Regular;Thin liquid    Liquid Administration via: Cup;Straw Medication Administration: Whole meds with liquid Supervision: Patient able to self feed Compensations: Slow rate;Small sips/bites Postural Changes: Seated upright at 90 degrees    Other  Recommendations Oral Care Recommendations: Oral care BID    Recommendations for follow up therapy are one component of a multi-disciplinary discharge planning process, led by the attending physician.  Recommendations may be updated based on patient status, additional functional criteria and insurance authorization.  Follow up Recommendations No SLP follow up         Functional Status Assessment Patient has had a recent decline in their functional status and demonstrates the ability to make significant improvements in function in a reasonable and predictable amount of time.    Swallow Study   General HPI: [name] is a 89 y.o. male with medical history significant of hypertension, hypothyroidism, lung cancer, chronic respiratory failure with hypoxia on supplemental oxygen  at 2 LPM via [place] who presents to the emergency department from home via EMS due to 2-day onset of generalized weakness and inability to stand (different from baseline way he was able to ambulate  with a walker).  Patient was unable to provide history, history was obtained from ED PA and ED medical record.  Per report, patient was reported to be seeing things such as bugs and people that are not there.  Family was concerned that  patient is dehydrated.  EMS was activated and patient was taken to the ED for further evaluation and management. BSE requested Type of Study: Bedside Swallow Evaluation Previous Swallow Assessment: none in chart Diet Prior to this Study: Regular;Thin liquids (Level 0) Temperature Spikes Noted: No Respiratory Status: Room air History of Recent Intubation: No Behavior/Cognition: Alert;Cooperative;Pleasant mood Oral Cavity Assessment: Within Functional Limits Oral Care Completed by SLP: Recent completion by staff Oral Cavity - Dentition: Edentulous;Dentures, not available Vision: Functional for self-feeding Self-Feeding Abilities: Able to feed self;Needs assist;Needs set up Patient Positioning: Upright in bed Baseline Vocal Quality: Normal Volitional Cough: Strong Volitional Swallow: Able to elicit    Oral/Motor/Sensory Function Overall Oral Motor/Sensory Function: Within functional limits   Ice Chips Ice chips: Within functional limits   Thin Liquid Thin Liquid: Within functional limits    Nectar Thick Nectar Thick Liquid: Not tested   Honey Thick Honey Thick Liquid: Not tested   Puree Puree: Within functional limits   Solid     Solid: Within functional limits     [name], CCC-SLP Speech Language Pathologist  [name] 10/08/2023,2:26 PM

## 2023-10-08 NOTE — Plan of Care (Signed)
  Problem: Health Behavior/Discharge Planning: Goal: Ability to manage health-related needs will improve Outcome: Progressing   Problem: Clinical Measurements: Goal: Ability to maintain clinical measurements within normal limits will improve Outcome: Progressing Goal: Diagnostic test results will improve Outcome: Progressing Goal: Respiratory complications will improve Outcome: Progressing   Problem: Nutrition: Goal: Adequate nutrition will be maintained Outcome: Progressing   Problem: Coping: Goal: Level of anxiety will decrease Outcome: Progressing   Problem: Pain Management: Goal: General experience of comfort will improve Outcome: Progressing   Problem: Safety: Goal: Ability to remain free from injury will improve Outcome: Progressing   Problem: Skin Integrity: Goal: Risk for impaired skin integrity will decrease Outcome: Progressing

## 2023-10-09 DIAGNOSIS — G9341 Metabolic encephalopathy: Secondary | ICD-10-CM | POA: Diagnosis not present

## 2023-10-09 LAB — BASIC METABOLIC PANEL
Anion gap: 7 (ref 5–15)
BUN: 66 mg/dL — ABNORMAL HIGH (ref 8–23)
CO2: 21 mmol/L — ABNORMAL LOW (ref 22–32)
Calcium: 8.1 mg/dL — ABNORMAL LOW (ref 8.9–10.3)
Chloride: 103 mmol/L (ref 98–111)
Creatinine, Ser: 2.72 mg/dL — ABNORMAL HIGH (ref 0.61–1.24)
GFR, Estimated: 22 mL/min — ABNORMAL LOW (ref >60)
Glucose, Bld: 100 mg/dL — ABNORMAL HIGH (ref 70–99)
Potassium: 3.5 mmol/L (ref 3.5–5.1)
Sodium: 131 mmol/L — ABNORMAL LOW (ref 135–145)

## 2023-10-09 LAB — MAGNESIUM: Magnesium: 1.7 mg/dL (ref 1.7–2.4)

## 2023-10-09 LAB — CBC
HCT: 26.8 % — ABNORMAL LOW (ref 39.0–52.0)
Hemoglobin: 8.8 g/dL — ABNORMAL LOW (ref 13.0–17.0)
MCH: 29.2 pg (ref 26.0–34.0)
MCHC: 32.8 g/dL (ref 30.0–36.0)
MCV: 89 fL (ref 80.0–100.0)
Platelets: 165 10*3/uL (ref 150–400)
RBC: 3.01 MIL/uL — ABNORMAL LOW (ref 4.22–5.81)
RDW: 16.2 % — ABNORMAL HIGH (ref 11.5–15.5)
WBC: 4.2 10*3/uL (ref 4.0–10.5)
nRBC: 0 % (ref 0.0–0.2)

## 2023-10-09 LAB — GLUCOSE, CAPILLARY
Glucose-Capillary: 102 mg/dL — ABNORMAL HIGH (ref 70–99)
Glucose-Capillary: 110 mg/dL — ABNORMAL HIGH (ref 70–99)
Glucose-Capillary: 111 mg/dL — ABNORMAL HIGH (ref 70–99)
Glucose-Capillary: 119 mg/dL — ABNORMAL HIGH (ref 70–99)
Glucose-Capillary: 121 mg/dL — ABNORMAL HIGH (ref 70–99)
Glucose-Capillary: 129 mg/dL — ABNORMAL HIGH (ref 70–99)

## 2023-10-09 MED ORDER — SODIUM CHLORIDE 0.9 % IV SOLN: INTRAVENOUS | Status: AC

## 2023-10-09 NOTE — TOC Progression Note (Addendum)
 Transition of Care [facility]) - Progression Note    Patient Details  Name: [name] MRN: [id number] Date of Birth: May 05, 1933  Transition of [facility]) CM/SW Contact  Lorraine LILLETTE [place], [place] Phone Number: 10/09/2023, 4:57 PM  Clinical Narrative:     CSW submitted [employer] Auth as Pt DC ready.  [employer] request [phone]. Addendum added: May needs PASSR and FL2 completed 10/08/23.   [facility] bed request pending- [facility] was entered as provider for [employer].  TOC to follow.  Expected Discharge Plan: Skilled Nursing Facility Barriers to Discharge: English As A Second Language Teacher, SNF Pending bed offer  Expected Discharge Plan and Services In-house Referral: Clinical Social Work Discharge Planning Services: CM Consult Post Acute Care Choice: Skilled Nursing Facility Living arrangements for the past 2 months: Single Family Home                                       Social Determinants of Health (SDOH) Interventions SDOH Screenings   Food Insecurity: No Food Insecurity (10/07/2023)  Housing: Low Risk  (10/07/2023)  Transportation Needs: No Transportation Needs (10/07/2023)  Utilities: Not At Risk (10/07/2023)  Depression (PHQ2-9): Low Risk  (02/23/2023)  Social Connections: Unknown (10/08/2023)  Tobacco Use: Medium Risk (09/01/2023)    Readmission Risk Interventions    10/08/2023   12:45 PM  Readmission Risk Prevention Plan  Transportation Screening Complete  Medication Review (RN Care Manager) Complete  HRI or Home Care Consult Complete  SW Recovery Care/Counseling Consult Complete  Palliative Care Screening Not Applicable  Skilled Nursing Facility Complete

## 2023-10-09 NOTE — Progress Notes (Signed)
 PROGRESS NOTE    [name]  FMW:[id number] DOB: 05-Jan-1933 DOA: 10/07/2023 PCP: Joeann Browning, FNP   Brief Narrative:    [name] is a 89 y.o. male with medical history significant of hypertension, hypothyroidism, lung cancer, chronic respiratory failure with hypoxia on supplemental oxygen  at 2 LPM via [place] who presents to the emergency department from home via EMS due to 2-day onset of generalized weakness and inability to stand (different from baseline way he was able to ambulate with a walker).  Patient was admitted with acute metabolic encephalopathy in the setting of AKI as well as generalized weakness with suspicion of failure to thrive.  Palliative consulted.  Assessment & Plan:   Principal Problem:   Acute metabolic encephalopathy Active Problems:   COPD (chronic obstructive pulmonary disease) (HCC)   Chronic diastolic CHF (congestive heart failure) (HCC)   Essential hypertension   Acquired hypothyroidism   Benign prostatic hyperplasia   Iron  deficiency anemia   AKI (acute kidney injury) (HCC)   Dehydration   Prolonged QT interval   Generalized weakness   Failure to thrive in adult   Hypoalbuminemia due to protein-calorie malnutrition (HCC)   Chronic respiratory failure with hypoxia (HCC)  Assessment and Plan:   Acute metabolic encephalopathy Patient was reported to be hallucinating at home, this may be due to multifactorial including dehydration, uremic encephalopathy etc. IV NS x 1 was given, continue IV hydration   Acute kidney injury-improving Dehydration BUN/creatinine 66/3.78 (baseline creatinine at 2.1-2.5). Continue gentle hydration Renally adjust medications, avoid nephrotoxic agents/dehydration/hypotension   Generalized weakness and failure to thrive in adult PT recommending SNF Dietitian will be consulted and we will await further recommendation   Prolonged QT interval QTc 510 ms Avoid QT prolonging drugs Magnesium  level will be  checked Repeat EKG in the morning   Hypoalbuminemia possibly secondary to mild protein calorie malnutrition Albumin 3.1, protein supplement will be provided   Chronic respiratory failure with hypoxia Continue supplemental oxygen  to maintain O2 sat > 92% per home regimen   COPD (not in acute exacerbation) Continue Spiriva    BPH Continue Flomax    Chronic diastolic CHF Stable. 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 The patient is status post initial course of concurrent chemoradiation with weekly carboplatin  and paclitaxel  for 6 cycles with partial response and he was treated with first-line treatment with immunotherapy with Libtayo  (Cempilimab) 350 Mg IV every 3 weeks status post 32 cycles complicated with immunotherapy mediated vitiligo and it was discontinued secondary to worsening chronic kidney disease.  Patient is now on observation and is stable Patient follows with Dr. Gatha    DVT prophylaxis:Heparin  Code Status: DNR Family Communication: Discussed with daughter on phone 1/10 Disposition Plan:  Status is: Inpatient Remains inpatient appropriate because: Need for IV fluid.  Consultants:  Palliative  Procedures:  None  Antimicrobials:  None   Subjective: Patient seen and evaluated today with no new acute complaints or concerns. No acute concerns or events noted overnight.  Objective: Vitals:   10/08/23 1752 10/08/23 2002 10/08/23 2222 10/09/23 0421  BP: 117/65 (!) 142/70 (!) 144/73   Pulse: 78 83  81   Resp: 20 20 20    Temp: 98.1 F (36.7 C)  99.2 F (37.3 C) 99.5 F (37.5 C)   TempSrc: Oral Oral Oral   SpO2: 100% 100% 100%   Weight:    53.6 kg  Height:        Intake/Output Summary (Last 24 hours) at 10/09/2023 1135 Last data filed at 10/09/2023 0421 Gross per 24 hour  Intake 1717.26 ml  Output 600 ml  Net 1117.26 ml   Filed Weights   10/07/23 2056 10/08/23 0500 10/09/23 0421  Weight: 57.2 kg 53.6 kg 53.6 kg    Examination:  General exam: Appears calm and comfortable, appears thin/frail Respiratory system: Clear to auscultation. Respiratory effort normal. Cardiovascular system: S1 & S2 heard, RRR.  Gastrointestinal system: Abdomen is soft Central nervous system: Alert and awake Extremities: No edema Skin: No significant lesions noted Psychiatry: Flat affect.    Data Reviewed: I have personally reviewed following labs and imaging studies  CBC: Recent Labs  Lab 10/07/23 1604 10/08/23 0347 10/09/23 0352  WBC 5.3 5.1 4.2  NEUTROABS 4.6  --   --   HGB 10.5* 10.1* 8.8*  HCT 32.5* 31.6* 26.8*  MCV 90.3 90.0 89.0  PLT 168 179 165   Basic Metabolic Panel: Recent Labs  Lab 10/07/23 1604 10/08/23 0347 10/09/23 0352  NA 134* 133* 131*  K 4.2 3.7 3.5  CL 98 102 103  CO2 22 19* 21*  GLUCOSE 84 57* 100*  BUN 66* 65* 66*  CREATININE 3.78* 3.29* 2.72*  CALCIUM  8.7* 8.5* 8.1*  MG  --  1.8 1.7  PHOS  --  3.0  --    GFR: Estimated Creatinine Clearance: 14.2 mL/min (A) (by C-G formula based on SCr of 2.72 mg/dL (H)). Liver Function Tests: Recent Labs  Lab 10/07/23 1604 10/08/23 0347  AST 31 31  ALT 14 13  ALKPHOS 74 69  BILITOT 0.8 0.9  PROT 7.8 7.3  ALBUMIN 3.1* 2.9*   No results for input(s): LIPASE, AMYLASE in the last 168 hours. Recent Labs  Lab 10/07/23 1604  AMMONIA 13   Coagulation Profile: No results for input(s): INR, PROTIME in the last 168 hours. Cardiac Enzymes: No results for input(s): CKTOTAL, CKMB, CKMBINDEX, TROPONINI in the last 168 hours. BNP (last 3  results) No results for input(s): PROBNP in the last 8760 hours. HbA1C: No results for input(s): HGBA1C in the last 72 hours. CBG: Recent Labs  Lab 10/08/23 1610 10/08/23 1959 10/09/23 0004 10/09/23 0357 10/09/23 0729  GLUCAP 118* 107* 111* 102* 119*   Lipid Profile: No results for input(s): CHOL, HDL, LDLCALC, TRIG, CHOLHDL, LDLDIRECT in the last 72 hours. Thyroid  Function Tests: No results for input(s): TSH, T4TOTAL, FREET4, T3FREE, THYROIDAB in the last 72 hours. Anemia Panel: No results for input(s): VITAMINB12, FOLATE, FERRITIN, TIBC, IRON , RETICCTPCT in the last 72 hours. Sepsis Labs: Recent Labs  Lab 10/07/23 1604  LATICACIDVEN 1.5    No results found for this or any previous visit (from the past 240 hours).       Radiology Studies: CT HEAD WO CONTRAST Result Date: 10/07/2023 CLINICAL DATA:  Mental status change, unknown cause EXAM: CT HEAD WITHOUT CONTRAST TECHNIQUE: Contiguous axial images were obtained from the base of the skull through the vertex without intravenous contrast. RADIATION DOSE REDUCTION: This exam was performed according to the departmental dose-optimization program which includes automated exposure control, adjustment of the mA and/or kV according to patient size and/or use of iterative reconstruction technique. COMPARISON:  Brain MR 01/02/21 FINDINGS: Brain: No hemorrhage. No hydrocephalus. No extra-axial fluid collection. No CT evidence of an acute cortical infarct. No mass effect. No mass lesion. There is a background of mild chronic microvascular ischemic change. Vascular: No hyperdense vessel or unexpected calcification. Skull: Normal. Negative for fracture or focal lesion. Sinuses/Orbits: No middle ear or mastoid effusion. Paranasal sinuses clear. Orbits are unremarkable. Other: None. IMPRESSION: No CT etiology for altered mental status identified. Electronically Signed   By: Lyndall Gore M.D.   On: 10/07/2023  16:15   DG Chest Port 1 View Result Date: 10/07/2023 CLINICAL DATA:  Altered mental status EXAM: PORTABLE CHEST 1 VIEW COMPARISON:  07/07/2023, PET CT 08/31/2023, chest x-ray 12/12/2020, CT 01/27/2023 FINDINGS: Emphysema. Stable bandlike scarring in the right upper lung with distortion along the right upper lobe pleuroparenchymal surface and consistent with scarring. Right hilar fullness and distortion, no change and presumably due to post therapeutic change, no findings suspicious for recurrence on interval PET CT 08/31/2023. stable cardiomediastinal silhouette with aortic atherosclerosis. No pneumothorax IMPRESSION: No active disease. Emphysema with right upper lobe/parahilar scarring and distortion as seen on prior exam. Electronically Signed   By: Luke Bun M.D.   On: 10/07/2023 15:56        Scheduled Meds:  amLODipine   10 mg Oral Daily   feeding supplement  237 mL Oral TID BM   ferrous sulfate   325 mg Oral Once per day on Monday Wednesday Friday   heparin   5,000 Units Subcutaneous Q8H   labetalol   200 mg Oral BID   levothyroxine   150 mcg Oral Q0600   multivitamin with minerals  1 tablet Oral Daily   tamsulosin   0.4 mg Oral Daily   umeclidinium bromide   1 puff Inhalation Daily      LOS: 2 days    Time spent: 35 minutes    Makeisha Jentsch JONETTA Fairly, DO [facility]  If 7PM-7AM, please contact night-coverage [email or web address] 10/09/2023, 11:35 AM

## 2023-10-10 DIAGNOSIS — G9341 Metabolic encephalopathy: Secondary | ICD-10-CM | POA: Diagnosis not present

## 2023-10-10 LAB — GLUCOSE, CAPILLARY
Glucose-Capillary: 122 mg/dL — ABNORMAL HIGH (ref 70–99)
Glucose-Capillary: 109 mg/dL — ABNORMAL HIGH (ref 70–99)
Glucose-Capillary: 103 mg/dL — ABNORMAL HIGH (ref 70–99)
Glucose-Capillary: 113 mg/dL — ABNORMAL HIGH (ref 70–99)
Glucose-Capillary: 121 mg/dL — ABNORMAL HIGH (ref 70–99)

## 2023-10-10 NOTE — TOC Progression Note (Signed)
 Transition of Care [facility]) - Progression Note    Patient Details  Name: [name] MRN: [id number] Date of Birth: Feb 11, 1933  [facility] CM/SW Contact  Lorraine LILLETTE [place], [place] Phone Number: 10/10/2023, 1:13 PM  Clinical Narrative:    CSW checked Clista Barrows approved [id number]. PNC bed request pending.TOC to follow.    Expected Discharge Plan: Skilled Nursing Facility Barriers to Discharge: English As A Second Language Teacher, SNF Pending bed offer  Expected Discharge Plan and Services In-house Referral: Clinical Social Work Discharge Planning Services: CM Consult Post Acute Care Choice: Skilled Nursing Facility Living arrangements for the past 2 months: Single Family Home                                       Social Determinants of Health (SDOH) Interventions SDOH Screenings   Food Insecurity: No Food Insecurity (10/07/2023)  Housing: Low Risk  (10/07/2023)  Transportation Needs: No Transportation Needs (10/07/2023)  Utilities: Not At Risk (10/07/2023)  Depression (PHQ2-9): Low Risk  (02/23/2023)  Social Connections: Unknown (10/08/2023)  Tobacco Use: Medium Risk (09/01/2023)    Readmission Risk Interventions    10/08/2023   12:45 PM  Readmission Risk Prevention Plan  Transportation Screening Complete  Medication Review (RN Care Manager) Complete  HRI or Home Care Consult Complete  SW Recovery Care/Counseling Consult Complete  Palliative Care Screening Not Applicable  Skilled Nursing Facility Complete

## 2023-10-10 NOTE — Progress Notes (Signed)
 PROGRESS NOTE    [name]  FMW:[id number] DOB: [birth year] DOA: 10/07/2023 PCP: Joeann Browning, FNP   Brief Narrative:    [name] is a 89 y.o. male with medical history significant of hypertension, hypothyroidism, lung cancer, chronic respiratory failure with hypoxia on supplemental oxygen  at 2 LPM via [place] who presents to the emergency department from home via EMS due to 2-day onset of generalized weakness and inability to stand (different from baseline way he was able to ambulate with a walker).  Patient was admitted with acute metabolic encephalopathy in the setting of AKI as well as generalized weakness with suspicion of failure to thrive.  Palliative consulted.  Assessment & Plan:   Principal Problem:   Acute metabolic encephalopathy Active Problems:   COPD (chronic obstructive pulmonary disease) (HCC)   Chronic diastolic CHF (congestive heart failure) (HCC)   Essential hypertension   Acquired hypothyroidism   Benign prostatic hyperplasia   Iron  deficiency anemia   AKI (acute kidney injury) (HCC)   Dehydration   Prolonged QT interval   Generalized weakness   Failure to thrive in adult   Hypoalbuminemia due to protein-calorie malnutrition (HCC)   Chronic respiratory failure with hypoxia (HCC)  Assessment and Plan:   Acute metabolic encephalopathy Patient was reported to be hallucinating at home, this may be due to multifactorial including dehydration, uremic encephalopathy etc. IV NS x 1 was given, continue IV hydration   Acute kidney injury-improved Dehydration BUN/creatinine 66/3.78 (baseline creatinine at 2.1-2.5). Renally adjust medications, avoid nephrotoxic agents/dehydration/hypotension   Generalized weakness and failure to thrive in adult PT recommending SNF Dietitian will be consulted and we will await further recommendation   Prolonged QT interval QTc 510 ms Avoid QT prolonging drugs Magnesium  level will be checked Repeat EKG in the  morning   Hypoalbuminemia possibly secondary to mild protein calorie malnutrition Albumin 3.1, protein supplement will be provided   Chronic respiratory failure with hypoxia Continue supplemental oxygen  to maintain O2 sat > 92% per home regimen   COPD (not in acute exacerbation) Continue Spiriva    BPH Continue Flomax    Chronic diastolic CHF Stable. 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 The patient is status post initial course of concurrent chemoradiation with weekly carboplatin  and paclitaxel  for 6 cycles with partial response and he was treated with first-line treatment with immunotherapy with Libtayo  (Cempilimab) 350 Mg IV every 3 weeks status post 32 cycles complicated with immunotherapy mediated vitiligo and it was discontinued secondary to worsening chronic kidney disease.  Patient is now on observation and is stable Patient follows with Dr. Gatha    DVT prophylaxis:Heparin  Code Status: DNR Family Communication: Discussed with daughter on phone 1/10 Disposition Plan:  Status is: Inpatient Remains inpatient appropriate because: Need for IV fluid.  Consultants:  Palliative  Procedures:  None  Antimicrobials:  None   Subjective: Patient seen and evaluated today with no new acute complaints or concerns. No acute concerns or events noted overnight.  Objective: Vitals:   10/09/23 0421 10/09/23 1450 10/09/23 2018 10/10/23 0430  BP:  127/73 135/70 (!) 151/70  Pulse:  67 71 71  Resp:  20 18 (!) 8  Temp:  98.7 F (37.1 C) 98.5 F (36.9 C) 98.3 F  (36.8 C)  TempSrc:  Oral Oral   SpO2:  99% 100% 100%  Weight: 53.6 kg   53.6 kg  Height:        Intake/Output Summary (Last 24 hours) at 10/10/2023 1011 Last data filed at 10/10/2023 0600 Gross per 24 hour  Intake 1622.17 ml  Output 2100 ml  Net -477.83 ml   Filed Weights   10/08/23 0500 10/09/23 0421 10/10/23 0430  Weight: 53.6 kg 53.6 kg 53.6 kg    Examination:  General exam: Appears calm and comfortable, appears thin/frail Respiratory system: Clear to auscultation. Respiratory effort normal. Cardiovascular system: S1 & S2 heard, RRR.  Gastrointestinal system: Abdomen is soft Central nervous system: Alert and awake Extremities: No edema Skin: No significant lesions noted Psychiatry: Flat affect.    Data Reviewed: I have personally reviewed following labs and imaging studies  CBC: Recent Labs  Lab 10/07/23 1604 10/08/23 0347 10/09/23 0352  WBC 5.3 5.1 4.2  NEUTROABS 4.6  --   --   HGB 10.5* 10.1* 8.8*  HCT 32.5* 31.6* 26.8*  MCV 90.3 90.0 89.0  PLT 168 179 165   Basic Metabolic Panel: Recent Labs  Lab 10/07/23 1604 10/08/23 0347 10/09/23 0352  NA 134* 133* 131*  K 4.2 3.7 3.5  CL 98 102 103  CO2 22 19* 21*  GLUCOSE 84 57* 100*  BUN 66* 65* 66*  CREATININE 3.78* 3.29* 2.72*  CALCIUM  8.7* 8.5* 8.1*  MG  --  1.8 1.7  PHOS  --  3.0  --    GFR: Estimated Creatinine Clearance: 14.2 mL/min (A) (by C-G formula based on SCr of 2.72 mg/dL (H)). Liver Function Tests: Recent Labs  Lab 10/07/23 1604 10/08/23 0347  AST 31 31  ALT 14 13  ALKPHOS 74 69  BILITOT 0.8 0.9  PROT 7.8 7.3  ALBUMIN 3.1* 2.9*   No results for input(s): LIPASE, AMYLASE in the last 168 hours. Recent Labs  Lab 10/07/23 1604  AMMONIA 13   Coagulation Profile: No results for input(s): INR, PROTIME in the last 168 hours. Cardiac Enzymes: No results for input(s): CKTOTAL, CKMB, CKMBINDEX, TROPONINI in the last 168 hours. BNP (last 3 results) No results for  input(s): PROBNP in the last 8760 hours. HbA1C: No results for input(s): HGBA1C in the last 72 hours. CBG: Recent Labs  Lab 10/09/23 1630 10/09/23 2019 10/10/23 0102 10/10/23 0509 10/10/23 0745  GLUCAP 121* 129* 122* 109* 103*   Lipid Profile: No results for input(s): CHOL, HDL, LDLCALC, TRIG, CHOLHDL, LDLDIRECT in the last 72 hours. Thyroid  Function Tests: No results for input(s): TSH, T4TOTAL, FREET4, T3FREE, THYROIDAB in the last 72 hours. Anemia Panel: No results for input(s): VITAMINB12, FOLATE, FERRITIN, TIBC, IRON , RETICCTPCT in the last 72 hours. Sepsis Labs: Recent Labs  Lab 10/07/23 1604  LATICACIDVEN 1.5    No results found for this or any previous visit (from the past 240 hours).       Radiology Studies: No results found.       Scheduled Meds:  amLODipine   10 mg Oral Daily   feeding supplement  237 mL Oral TID BM   ferrous sulfate   325 mg Oral Once per day on Monday Wednesday Friday   heparin   5,000 Units Subcutaneous Q8H   labetalol   200 mg Oral BID   levothyroxine   150 mcg Oral Q0600   multivitamin with minerals  1 tablet Oral  Daily   tamsulosin   0.4 mg Oral Daily   umeclidinium bromide   1 puff Inhalation Daily      LOS: 3 days    Time spent: 35 minutes    Brett Darko JONETTA Fairly, DO [facility]  If 7PM-7AM, please contact night-coverage [email or web address] 10/10/2023, 10:11 AM

## 2023-10-11 DIAGNOSIS — I3139 Other pericardial effusion (noninflammatory): Secondary | ICD-10-CM | POA: Diagnosis not present

## 2023-10-11 DIAGNOSIS — M6281 Muscle weakness (generalized): Secondary | ICD-10-CM | POA: Diagnosis not present

## 2023-10-11 DIAGNOSIS — C3411 Malignant neoplasm of upper lobe, right bronchus or lung: Secondary | ICD-10-CM | POA: Diagnosis not present

## 2023-10-11 DIAGNOSIS — R918 Other nonspecific abnormal finding of lung field: Secondary | ICD-10-CM | POA: Diagnosis not present

## 2023-10-11 DIAGNOSIS — N401 Enlarged prostate with lower urinary tract symptoms: Secondary | ICD-10-CM | POA: Diagnosis not present

## 2023-10-11 DIAGNOSIS — R627 Adult failure to thrive: Secondary | ICD-10-CM | POA: Diagnosis not present

## 2023-10-11 DIAGNOSIS — Z66 Do not resuscitate: Secondary | ICD-10-CM | POA: Diagnosis not present

## 2023-10-11 DIAGNOSIS — R9431 Abnormal electrocardiogram [ECG] [EKG]: Secondary | ICD-10-CM | POA: Diagnosis not present

## 2023-10-11 DIAGNOSIS — L80 Vitiligo: Secondary | ICD-10-CM | POA: Diagnosis not present

## 2023-10-11 DIAGNOSIS — I700 Atherosclerosis of aorta: Secondary | ICD-10-CM | POA: Diagnosis not present

## 2023-10-11 DIAGNOSIS — I5032 Chronic diastolic (congestive) heart failure: Secondary | ICD-10-CM | POA: Diagnosis not present

## 2023-10-11 DIAGNOSIS — E039 Hypothyroidism, unspecified: Secondary | ICD-10-CM | POA: Diagnosis not present

## 2023-10-11 DIAGNOSIS — N179 Acute kidney failure, unspecified: Secondary | ICD-10-CM | POA: Diagnosis not present

## 2023-10-11 DIAGNOSIS — J449 Chronic obstructive pulmonary disease, unspecified: Secondary | ICD-10-CM | POA: Diagnosis not present

## 2023-10-11 DIAGNOSIS — I6523 Occlusion and stenosis of bilateral carotid arteries: Secondary | ICD-10-CM | POA: Diagnosis not present

## 2023-10-11 DIAGNOSIS — I110 Hypertensive heart disease with heart failure: Secondary | ICD-10-CM | POA: Diagnosis not present

## 2023-10-11 DIAGNOSIS — Z7189 Other specified counseling: Secondary | ICD-10-CM

## 2023-10-11 DIAGNOSIS — R222 Localized swelling, mass and lump, trunk: Secondary | ICD-10-CM | POA: Diagnosis not present

## 2023-10-11 DIAGNOSIS — F4321 Adjustment disorder with depressed mood: Secondary | ICD-10-CM | POA: Diagnosis not present

## 2023-10-11 DIAGNOSIS — I130 Hypertensive heart and chronic kidney disease with heart failure and stage 1 through stage 4 chronic kidney disease, or unspecified chronic kidney disease: Secondary | ICD-10-CM | POA: Diagnosis not present

## 2023-10-11 DIAGNOSIS — N400 Enlarged prostate without lower urinary tract symptoms: Secondary | ICD-10-CM | POA: Diagnosis not present

## 2023-10-11 DIAGNOSIS — R4781 Slurred speech: Secondary | ICD-10-CM | POA: Diagnosis not present

## 2023-10-11 DIAGNOSIS — J440 Chronic obstructive pulmonary disease with acute lower respiratory infection: Secondary | ICD-10-CM | POA: Diagnosis not present

## 2023-10-11 DIAGNOSIS — D638 Anemia in other chronic diseases classified elsewhere: Secondary | ICD-10-CM | POA: Diagnosis not present

## 2023-10-11 DIAGNOSIS — N390 Urinary tract infection, site not specified: Secondary | ICD-10-CM | POA: Diagnosis not present

## 2023-10-11 DIAGNOSIS — J9611 Chronic respiratory failure with hypoxia: Secondary | ICD-10-CM | POA: Diagnosis not present

## 2023-10-11 DIAGNOSIS — C3412 Malignant neoplasm of upper lobe, left bronchus or lung: Secondary | ICD-10-CM | POA: Diagnosis not present

## 2023-10-11 DIAGNOSIS — N139 Obstructive and reflux uropathy, unspecified: Secondary | ICD-10-CM | POA: Diagnosis not present

## 2023-10-11 DIAGNOSIS — N184 Chronic kidney disease, stage 4 (severe): Secondary | ICD-10-CM | POA: Diagnosis not present

## 2023-10-11 DIAGNOSIS — G894 Chronic pain syndrome: Secondary | ICD-10-CM | POA: Diagnosis not present

## 2023-10-11 DIAGNOSIS — N134 Hydroureter: Secondary | ICD-10-CM | POA: Diagnosis not present

## 2023-10-11 DIAGNOSIS — Z743 Need for continuous supervision: Secondary | ICD-10-CM | POA: Diagnosis not present

## 2023-10-11 DIAGNOSIS — R498 Other voice and resonance disorders: Secondary | ICD-10-CM | POA: Diagnosis not present

## 2023-10-11 DIAGNOSIS — R636 Underweight: Secondary | ICD-10-CM | POA: Diagnosis not present

## 2023-10-11 DIAGNOSIS — R4182 Altered mental status, unspecified: Secondary | ICD-10-CM | POA: Diagnosis not present

## 2023-10-11 DIAGNOSIS — R509 Fever, unspecified: Secondary | ICD-10-CM | POA: Diagnosis not present

## 2023-10-11 DIAGNOSIS — D6489 Other specified anemias: Secondary | ICD-10-CM | POA: Diagnosis not present

## 2023-10-11 DIAGNOSIS — D631 Anemia in chronic kidney disease: Secondary | ICD-10-CM | POA: Diagnosis not present

## 2023-10-11 DIAGNOSIS — Z515 Encounter for palliative care: Secondary | ICD-10-CM

## 2023-10-11 DIAGNOSIS — Z9981 Dependence on supplemental oxygen: Secondary | ICD-10-CM | POA: Diagnosis not present

## 2023-10-11 DIAGNOSIS — R262 Difficulty in walking, not elsewhere classified: Secondary | ICD-10-CM | POA: Diagnosis not present

## 2023-10-11 DIAGNOSIS — R770 Abnormality of albumin: Secondary | ICD-10-CM | POA: Diagnosis not present

## 2023-10-11 DIAGNOSIS — I129 Hypertensive chronic kidney disease with stage 1 through stage 4 chronic kidney disease, or unspecified chronic kidney disease: Secondary | ICD-10-CM | POA: Diagnosis not present

## 2023-10-11 DIAGNOSIS — N136 Pyonephrosis: Secondary | ICD-10-CM | POA: Diagnosis not present

## 2023-10-11 DIAGNOSIS — R488 Other symbolic dysfunctions: Secondary | ICD-10-CM | POA: Diagnosis not present

## 2023-10-11 DIAGNOSIS — G9341 Metabolic encephalopathy: Secondary | ICD-10-CM | POA: Diagnosis not present

## 2023-10-11 DIAGNOSIS — J9601 Acute respiratory failure with hypoxia: Secondary | ICD-10-CM | POA: Diagnosis not present

## 2023-10-11 DIAGNOSIS — K5900 Constipation, unspecified: Secondary | ICD-10-CM | POA: Diagnosis not present

## 2023-10-11 DIAGNOSIS — I10 Essential (primary) hypertension: Secondary | ICD-10-CM | POA: Diagnosis not present

## 2023-10-11 DIAGNOSIS — E860 Dehydration: Secondary | ICD-10-CM | POA: Diagnosis not present

## 2023-10-11 DIAGNOSIS — J439 Emphysema, unspecified: Secondary | ICD-10-CM | POA: Diagnosis not present

## 2023-10-11 DIAGNOSIS — F0390 Unspecified dementia without behavioral disturbance: Secondary | ICD-10-CM | POA: Diagnosis not present

## 2023-10-11 DIAGNOSIS — R404 Transient alteration of awareness: Secondary | ICD-10-CM | POA: Diagnosis not present

## 2023-10-11 DIAGNOSIS — N1330 Unspecified hydronephrosis: Secondary | ICD-10-CM | POA: Diagnosis not present

## 2023-10-11 DIAGNOSIS — E875 Hyperkalemia: Secondary | ICD-10-CM | POA: Diagnosis not present

## 2023-10-11 DIAGNOSIS — J189 Pneumonia, unspecified organism: Secondary | ICD-10-CM | POA: Diagnosis not present

## 2023-10-11 DIAGNOSIS — Z681 Body mass index (BMI) 19 or less, adult: Secondary | ICD-10-CM | POA: Diagnosis not present

## 2023-10-11 DIAGNOSIS — E43 Unspecified severe protein-calorie malnutrition: Secondary | ICD-10-CM | POA: Diagnosis not present

## 2023-10-11 DIAGNOSIS — R000 Tachycardia, unspecified: Secondary | ICD-10-CM | POA: Diagnosis not present

## 2023-10-11 DIAGNOSIS — C3491 Malignant neoplasm of unspecified part of right bronchus or lung: Secondary | ICD-10-CM | POA: Diagnosis not present

## 2023-10-11 LAB — URINE CULTURE: Culture: >=100000 — AB

## 2023-10-11 LAB — GLUCOSE, CAPILLARY
Glucose-Capillary: 102 mg/dL — ABNORMAL HIGH (ref 70–99)
Glucose-Capillary: 114 mg/dL — ABNORMAL HIGH (ref 70–99)
Glucose-Capillary: 98 mg/dL (ref 70–99)

## 2023-10-11 MED ORDER — ENSURE ENLIVE PO LIQD: 237.0000 mL | Freq: Three times a day (TID) | ORAL | 12 refills | Status: DC

## 2023-10-11 NOTE — Plan of Care (Signed)
   Problem: Health Behavior/Discharge Planning: Goal: Ability to manage health-related needs will improve Outcome: Progressing   Problem: Clinical Measurements: Goal: Ability to maintain clinical measurements within normal limits will improve Outcome: Progressing Goal: Will remain free from infection Outcome: Progressing Goal: Diagnostic test results will improve Outcome: Progressing

## 2023-10-11 NOTE — Progress Notes (Signed)
 Report called to Chip Boer, Charity fundraiser at [employer] center

## 2023-10-11 NOTE — Discharge Summary (Signed)
 Physician Discharge Summary  [name] FMW:[id number] DOB: Sep 22, 1933 DOA: 10/07/2023  PCP: Joeann Browning, FNP  Admit date: 10/07/2023  Discharge date: 10/11/2023  Admitted From:Home  Disposition:  SNF  Recommendations for Outpatient Follow-up:  Follow up with PCP in 1-2 weeks Continue on medications as noted below Consider outpatient palliative consultation  Home Health: None  Equipment/Devices: None  Discharge Condition:Stable  CODE STATUS: DNR  Diet recommendation: Heart Healthy  Brief/Interim Summary: [name] is a 89 y.o. male with medical history significant of hypertension, hypothyroidism, lung cancer, chronic respiratory failure with hypoxia on supplemental oxygen  at 2 LPM via [place] who presents to the emergency department from home via EMS due to 2-day onset of generalized weakness and inability to stand (different from baseline way he was able to ambulate with a walker).  Patient was admitted with acute metabolic encephalopathy in the setting of AKI as well as generalized weakness with suspicion of failure to thrive.  He has received IV fluid and is now rehydrated and appears to be tolerating some more oral intake.  He has been seen by PT with recommendations for SNF and he is agreeable to trying rehabilitation.  No other acute events or concerns noted throughout the course of this admission.  Discharge Diagnoses:  Principal Problem:   Acute metabolic encephalopathy Active Problems:   COPD (chronic obstructive pulmonary disease) (HCC)   Chronic diastolic CHF (congestive heart failure) (HCC)   Essential hypertension   Acquired hypothyroidism   Benign prostatic hyperplasia   Iron  deficiency anemia   AKI (acute kidney injury) (HCC)   Dehydration   Prolonged QT interval   Generalized weakness   Failure to thrive in adult   Hypoalbuminemia due to protein-calorie malnutrition (HCC)   Chronic respiratory failure with hypoxia (HCC)  Principal discharge  diagnosis: Acute metabolic encephalopathy in the setting of prerenal AKI.  Generalized weakness and failure to thrive.  Discharge Instructions  Discharge Instructions     Diet - low sodium heart healthy   Complete by: As directed    Increase activity slowly   Complete by: As directed       Allergies as of 10/11/2023   No Known Allergies      Medication List     TAKE these medications    amLODipine  10 MG tablet Commonly known as: NORVASC  Take 1 tablet (10 mg total) by mouth daily. Hold for blood pressure reading <130   buPROPion  ER 100 MG 12 hr tablet Commonly known as: WELLBUTRIN  SR Take 100 mg by mouth 2 (two) times daily.   calcium  carbonate 1500 (600 Ca) MG Tabs tablet Commonly known as: OSCAL Take 500 mg by mouth daily with breakfast. For calcium  supplement   feeding supplement Liqd Take 237 mLs by mouth 3 (three) times daily between meals.   ferrous sulfate  325 (65 FE) MG EC tablet Take 1 tablet (325 mg total) by mouth 3 (three) times a week.   fluticasone -salmeterol 250-50 MCG/ACT Aepb Commonly known as: ADVAIR Inhale 1 puff into the lungs in the morning and at bedtime. What changed:  when to take this reasons to take this   furosemide  40 MG tablet Commonly known as: LASIX  Take 1 tablet (40 mg total) by mouth daily.   gabapentin  250 MG/5ML solution Commonly known as: NEURONTIN  Take 3 mLs (150 mg total) by mouth at bedtime.   Incruse Ellipta  62.5 MCG/ACT Aepb Generic drug: umeclidinium bromide  Inhale 1 puff into the lungs daily.   labetalol  200 MG tablet Commonly known  as: NORMODYNE  Take 1 tablet (200 mg total) by mouth 2 (two) times daily. Hold for blood pressure reading <130   levothyroxine  150 MCG tablet Commonly known as: SYNTHROID  Take 1 tablet (150 mcg total) by mouth daily.   Prosource Pack Take 1 each by mouth 3 (three) times daily. What changed: additional instructions   Spiriva  Respimat 1.25 MCG/ACT Aers Generic drug: Tiotropium  Bromide Monohydrate Inhale 2 puffs into the lungs daily at 2 PM.   tadalafil  5 MG tablet Commonly known as: CIALIS  Take 1 tablet (5 mg total) by mouth daily.   tamsulosin  0.4 MG Caps capsule Commonly known as: FLOMAX  Take 1 capsule (0.4 mg total) by mouth daily.        Follow-up Information     Joeann Browning, FNP. Schedule an appointment as soon as possible for a visit in 1 week(s).   Specialty: Nurse Practitioner Contact information: [address] [place] [address] [phone]                No Known Allergies  Consultations: Palliative care   Procedures/Studies: CT HEAD WO CONTRAST Result Date: 10/07/2023 CLINICAL DATA:  Mental status change, unknown cause EXAM: CT HEAD WITHOUT CONTRAST TECHNIQUE: Contiguous axial images were obtained from the base of the skull through the vertex without intravenous contrast. RADIATION DOSE REDUCTION: This exam was performed according to the departmental dose-optimization program which includes automated exposure control, adjustment of the mA and/or kV according to patient size and/or use of iterative reconstruction technique. COMPARISON:  Brain MR 01/02/21 FINDINGS: Brain: No hemorrhage. No hydrocephalus. No extra-axial fluid collection. No CT evidence of an acute cortical infarct. No mass effect. No mass lesion. There is a background of mild chronic microvascular ischemic change. Vascular: No hyperdense vessel or unexpected calcification. Skull: Normal. Negative for fracture or focal lesion. Sinuses/Orbits: No middle ear or mastoid effusion. Paranasal sinuses clear. Orbits are unremarkable. Other: None. IMPRESSION: No CT etiology for altered mental status identified. Electronically Signed   By: Lyndall Gore M.D.   On: 10/07/2023 16:15   DG Chest Port 1 View Result Date: 10/07/2023 CLINICAL DATA:  Altered mental status EXAM: PORTABLE CHEST 1 VIEW COMPARISON:  07/07/2023, PET CT 08/31/2023, chest x-ray 12/12/2020, CT 01/27/2023  FINDINGS: Emphysema. Stable bandlike scarring in the right upper lung with distortion along the right upper lobe pleuroparenchymal surface and consistent with scarring. Right hilar fullness and distortion, no change and presumably due to post therapeutic change, no findings suspicious for recurrence on interval PET CT 08/31/2023. stable cardiomediastinal silhouette with aortic atherosclerosis. No pneumothorax IMPRESSION: No active disease. Emphysema with right upper lobe/parahilar scarring and distortion as seen on prior exam. Electronically Signed   By: Luke Bun M.D.   On: 10/07/2023 15:56     Discharge Exam: Vitals:   10/11/23 0429 10/11/23 0718  BP: (!) 158/66   Pulse: 69   Resp: 16   Temp: 97.9 F (36.6 C)   SpO2: 100% 99%   Vitals:   10/10/23 2049 10/11/23 0429 10/11/23 0500 10/11/23 0718  BP: 132/68 (!) 158/66    Pulse: 70 69    Resp: 18 16    Temp:  97.9 F (36.6 C)    TempSrc:  Oral    SpO2: 100% 100%  99%  Weight:   53.6 kg   Height:        General: Pt is alert, awake, not in acute distress Cardiovascular: RRR, S1/S2 +, no rubs, no gallops Respiratory: CTA bilaterally, no wheezing, no  rhonchi Abdominal: Soft, NT, ND, bowel sounds + Extremities: no edema, no cyanosis    The results of significant diagnostics from this hospitalization (including imaging, microbiology, ancillary and laboratory) are listed below for reference.     Microbiology: Recent Results (from the past 240 hours)  Urine Culture     Status: Abnormal (Preliminary result)   Collection Time: 10/07/23  1:40 PM   Specimen: Urine, Random  Result Value Ref Range Status   Specimen Description   Final    URINE, RANDOM Performed at [facility], [address], [place], [place] [address]    Special Requests   Final    NONE Reflexed from [id number] Performed at [facility], [address], [place], [place] [address]    Culture (A)  Final    >=100,000 COLONIES/mL PROTEUS MIRABILIS SUSCEPTIBILITIES  TO FOLLOW Performed at [facility], [address], [place], [place] [address]    Report Status PENDING  Incomplete     Labs: BNP (last 3 results) Recent Labs    07/07/23 1818  BNP 175.0*   Basic Metabolic Panel: Recent Labs  Lab 10/07/23 1604 10/08/23 0347 10/09/23 0352  NA 134* 133* 131*  K 4.2 3.7 3.5  CL 98 102 103  CO2 22 19* 21*  GLUCOSE 84 57* 100*  BUN 66* 65* 66*  CREATININE 3.78* 3.29* 2.72*  CALCIUM  8.7* 8.5* 8.1*  MG  --  1.8 1.7  PHOS  --  3.0  --    Liver Function Tests: Recent Labs  Lab 10/07/23 1604 10/08/23 0347  AST 31 31  ALT 14 13  ALKPHOS 74 69  BILITOT 0.8 0.9  PROT 7.8 7.3  ALBUMIN 3.1* 2.9*   No results for input(s): LIPASE, AMYLASE in the last 168 hours. Recent Labs  Lab 10/07/23 1604  AMMONIA 13   CBC: Recent Labs  Lab 10/07/23 1604 10/08/23 0347 10/09/23 0352  WBC 5.3 5.1 4.2  NEUTROABS 4.6  --   --   HGB 10.5* 10.1* 8.8*  HCT 32.5* 31.6* 26.8*  MCV 90.3 90.0 89.0  PLT 168 179 165   Cardiac Enzymes: No results for input(s): CKTOTAL, CKMB, CKMBINDEX, TROPONINI in the last 168 hours. BNP: Invalid input(s): POCBNP CBG: Recent Labs  Lab 10/10/23 0745 10/10/23 1154 10/10/23 1719 10/11/23 0055 10/11/23 0737  GLUCAP 103* 113* 121* 114* 102*   D-Dimer No results for input(s): DDIMER in the last 72 hours. Hgb A1c No results for input(s): HGBA1C in the last 72 hours. Lipid Profile No results for input(s): CHOL, HDL, LDLCALC, TRIG, CHOLHDL, LDLDIRECT in the last 72 hours. Thyroid  function studies No results for input(s): TSH, T4TOTAL, T3FREE, THYROIDAB in the last 72 hours.  Invalid input(s): FREET3 Anemia work up No results for input(s): VITAMINB12, FOLATE, FERRITIN, TIBC, IRON , RETICCTPCT in the last 72 hours. Urinalysis    Component Value Date/Time   COLORURINE AMBER (A) 10/07/2023 1340   APPEARANCEUR TURBID (A) 10/07/2023 1340   APPEARANCEUR Cloudy  (A) 04/07/2023 1149   LABSPEC 1.012 10/07/2023 1340   PHURINE 8.0 10/07/2023 1340   GLUCOSEU NEGATIVE 10/07/2023 1340   HGBUR SMALL (A) 10/07/2023 1340   BILIRUBINUR NEGATIVE 10/07/2023 1340   BILIRUBINUR Negative 04/07/2023 1149   KETONESUR 5 (A) 10/07/2023 1340   PROTEINUR 100 (A) 10/07/2023 1340   UROBILINOGEN 0.2 05/16/2009 0857   NITRITE POSITIVE (A) 10/07/2023 1340   LEUKOCYTESUR LARGE (A) 10/07/2023 1340   Sepsis Labs Recent Labs  Lab 10/07/23 1604 10/08/23 0347 10/09/23 0352  WBC  5.3 5.1 4.2   Microbiology Recent Results (from the past 240 hours)  Urine Culture     Status: Abnormal (Preliminary result)   Collection Time: 10/07/23  1:40 PM   Specimen: Urine, Random  Result Value Ref Range Status   Specimen Description   Final    URINE, RANDOM Performed at [facility], [address] [place], [place] [address]    Special Requests   Final    NONE Reflexed from [id number] Performed at [facility], [address], [place], [place] [address]    Culture (A)  Final    >=100,000 COLONIES/mL PROTEUS MIRABILIS SUSCEPTIBILITIES TO FOLLOW Performed at [facility], [address], [place], [place] [address]    Report Status PENDING  Incomplete     Time coordinating discharge: 35 minutes  SIGNED:   Adron JONETTA Fairly, DO [facility] 10/11/2023, 10:54 AM  If 7PM-7AM, please contact night-coverage [email or web address]

## 2023-10-11 NOTE — Progress Notes (Signed)
 Palliative: Chart review completed.  Mr. [name] is to discharge to [facility] nursing center today with short-term rehab.  Goals are set at this point.  Face-to-face discussion with transition of care team related to patient condition, needs, disposition.  DNR/goldenrod form completed and placed on chart.  No charge Lorenza Birkenhead, NP Palliative medicine team Team phone [phone]

## 2023-10-11 NOTE — Progress Notes (Signed)
 Physical Therapy Treatment Patient Details Name: [name] MRN: [id number] DOB: June 23, 1933 Today's Date: 10/11/2023   History of Present Illness a 89 y.o. male with medical history significant of hypertension, hypothyroidism, lung cancer, chronic respiratory failure with hypoxia on supplemental oxygen  at 2 LPM via [place] who presents to the emergency department from home via EMS due to 2-day onset of generalized weakness and inability to stand (different from baseline way he was able to ambulate with a walker).  Patient was unable to provide history, history was obtained from ED PA and ED medical record.  Per report, patient was reported to be seeing things such as bugs and people that are not there.  Family was concerned that patient is dehydrated.  EMS was activated and patient was taken to the ED for further evaluation and management    PT Comments  Pt friendly and willing to participate with therapy.  Pt mod I with bed mobility, increased time and relies on bed rails to assist with sitting on EOB.  Seated therex complete for LE strengthening with cueing for full range, noted limited knee extension.  Mod A with transfer training and ability to sidestep to chair safely with use of RW for stability while standing, no LOB episodes or reports of increased pain, was limited by fatigue with activity.  Pt left in chair with call bell within reach and RN aware of status.      If plan is discharge home, recommend the following:     Can travel by private vehicle        Equipment Recommendations       Recommendations for Other Services       Precautions / Restrictions Precautions Precautions: Fall Restrictions Weight Bearing Restrictions Per Provider Order: No     Mobility  Bed Mobility                    Transfers                        Ambulation/Gait                   Stairs             Wheelchair Mobility     Tilt Bed    Modified Rankin  (Stroke Patients Only)       Balance                                            Cognition Arousal: Alert Behavior During Therapy: WFL for tasks assessed/performed, Flat affect Overall Cognitive Status: Difficult to assess                         Following Commands: Follows one step commands consistently                Exercises General Exercises - Lower Extremity Long Arc Quad: AROM, Both, 10 reps, Seated, Strengthening Hip Flexion/Marching: AROM, 10 reps, Seated, Strengthening, Both Toe Raises: AROM, Strengthening, Both, 10 reps, Seated Heel Raises: AROM, Both, 10 reps, Seated, Strengthening    General Comments        Pertinent Vitals/Pain Pain Assessment Pain Assessment: 0-10 Pain Score: 5  Pain Location: RUE and Rt LE Pain Descriptors / Indicators: Discomfort Pain Intervention(s): Monitored during session, Repositioned, Limited activity within patient's tolerance  Home Living                          Prior Function            PT Goals (current goals can now be found in the care plan section)      Frequency           PT Plan      Co-evaluation              AM-PAC PT 6 Clicks Mobility   Outcome Measure  Help needed turning from your back to your side while in a flat bed without using bedrails?: A Little Help needed moving from lying on your back to sitting on the side of a flat bed without using bedrails?: A Little Help needed moving to and from a bed to a chair (including a wheelchair)?: A Lot Help needed standing up from a chair using your arms (e.g., wheelchair or bedside chair)?: A Lot Help needed to walk in hospital room?: A Lot Help needed climbing 3-5 steps with a railing? : Total 6 Click Score: 13    End of Session Equipment Utilized During Treatment: Gait belt Activity Tolerance: Patient tolerated treatment well Patient left: in chair;with chair alarm set;with call bell/phone within  reach Nurse Communication: Mobility status       Time: 0900-0930 PT Time Calculation (min) (ACUTE ONLY): 30 min  Charges:    $Therapeutic Exercise: 8-22 mins $Therapeutic Activity: 8-22 mins PT General Charges $$ ACUTE PT VISIT: 1 Visit                     [name], LPTA/CLT; CBIS [phone]    [name] 10/11/2023, 10:07 AM

## 2023-10-11 NOTE — TOC Transition Note (Signed)
 Transition of Care [facility]) - Discharge Note   Patient Details  Name: [name] MRN: [id number] Date of Birth: [birth year]  Transition [facility] CM/SW Contact:  Lucie Lunger, LCSWA Phone Number: 10/11/2023, 1:13 PM   Clinical Narrative:    CSW updated by Corina with [facility] that pt has been accepted and they can take pt today. CSW updated Tami that insurance shara has been approved. CSW spoke with pts daughter to provide update on plan for D/C. CSW sent D/C clinicals via HUB. CSW provided RN with report number. TOC signing off.   Final next level of care: Skilled Nursing Facility Barriers to Discharge: Barriers Resolved   Patient Goals and CMS Choice Patient states their goals for this hospitalization and ongoing recovery are:: go to SNF CMS Medicare.gov Compare Post Acute Care list provided to:: Patient Represenative (must comment) Choice offered to / list presented to : Adult Children [place] ownership interest in [facility].provided to:: Adult Children    Discharge Placement              Patient chooses bed at: [facility] Patient to be transferred to facility by: Facility staff Name of family member notified: daughter Patient and family notified of of transfer: 10/11/23  Discharge Plan and Services Additional resources added to the After Visit Summary for   In-house Referral: Clinical Social Work Discharge Planning Services: CM Consult Post Acute Care Choice: Skilled Nursing Facility                               Social Drivers of Health (SDOH) Interventions SDOH Screenings   Food Insecurity: No Food Insecurity (10/07/2023)  Housing: Low Risk  (10/07/2023)  Transportation Needs: No Transportation Needs (10/07/2023)  Utilities: Not At Risk (10/07/2023)  Depression (PHQ2-9): Low Risk  (02/23/2023)  Social Connections: Unknown (10/08/2023)  Tobacco Use: Medium Risk (09/01/2023)     Readmission Risk Interventions    10/08/2023   12:45 PM   Readmission Risk Prevention Plan  Transportation Screening Complete  Medication Review (RN Care Manager) Complete  HRI or Home Care Consult Complete  SW Recovery Care/Counseling Consult Complete  Palliative Care Screening Not Applicable  Skilled Nursing Facility Complete

## 2023-10-11 NOTE — Plan of Care (Signed)
  Problem: Health Behavior/Discharge Planning: Goal: Ability to manage health-related needs will improve Outcome: Progressing   Problem: Clinical Measurements: Goal: Ability to maintain clinical measurements within normal limits will improve Outcome: Progressing Goal: Will remain free from infection Outcome: Progressing Goal: Diagnostic test results will improve Outcome: Progressing   Problem: Nutrition: Goal: Adequate nutrition will be maintained Outcome: Progressing   Problem: Safety: Goal: Ability to remain free from injury will improve Outcome: Progressing

## 2023-10-11 NOTE — Care Management Important Message (Signed)
 Important Message  Patient Details  Name: [name] MRN: [id number] Date of Birth: Mar 07, 1933   Important Message Given:  Yes - Medicare IM     Corey Harold 10/11/2023, 11:53 AM

## 2023-10-12 ENCOUNTER — Encounter: Payer: Self-pay | Admitting: Adult Health

## 2023-10-12 ENCOUNTER — Non-Acute Institutional Stay (SKILLED_NURSING_FACILITY): Payer: Self-pay | Admitting: Adult Health

## 2023-10-12 DIAGNOSIS — C3411 Malignant neoplasm of upper lobe, right bronchus or lung: Secondary | ICD-10-CM

## 2023-10-12 DIAGNOSIS — N184 Chronic kidney disease, stage 4 (severe): Secondary | ICD-10-CM

## 2023-10-12 DIAGNOSIS — I5032 Chronic diastolic (congestive) heart failure: Secondary | ICD-10-CM | POA: Diagnosis not present

## 2023-10-12 DIAGNOSIS — G9341 Metabolic encephalopathy: Secondary | ICD-10-CM | POA: Diagnosis not present

## 2023-10-12 DIAGNOSIS — I129 Hypertensive chronic kidney disease with stage 1 through stage 4 chronic kidney disease, or unspecified chronic kidney disease: Secondary | ICD-10-CM

## 2023-10-12 DIAGNOSIS — D638 Anemia in other chronic diseases classified elsewhere: Secondary | ICD-10-CM | POA: Diagnosis not present

## 2023-10-12 DIAGNOSIS — N400 Enlarged prostate without lower urinary tract symptoms: Secondary | ICD-10-CM | POA: Diagnosis not present

## 2023-10-12 DIAGNOSIS — J449 Chronic obstructive pulmonary disease, unspecified: Secondary | ICD-10-CM

## 2023-10-12 DIAGNOSIS — I110 Hypertensive heart disease with heart failure: Secondary | ICD-10-CM

## 2023-10-12 DIAGNOSIS — E039 Hypothyroidism, unspecified: Secondary | ICD-10-CM | POA: Diagnosis not present

## 2023-10-12 DIAGNOSIS — J9611 Chronic respiratory failure with hypoxia: Secondary | ICD-10-CM | POA: Diagnosis not present

## 2023-10-12 DIAGNOSIS — I700 Atherosclerosis of aorta: Secondary | ICD-10-CM

## 2023-10-12 DIAGNOSIS — G894 Chronic pain syndrome: Secondary | ICD-10-CM

## 2023-10-12 NOTE — Progress Notes (Signed)
 Location:  [facility] Room Number: 160 Place of Service:  SNF ([id number])   CODE STATUS: dnr   No Known Allergies  Chief Complaint  Patient presents with   Hospitalization Follow-up    HPI:   He is a 89 year old man who has been hospitalized from 10-07-23 through 10-11-23. His past medical history includes: hypertension; lung cancer; chronic respiratory failure with hypoxia on 02; hypertension. He presented to the ED with 2 day history of generalized weakness and inability to stand. He was admitted with acute metabolic encephalopathy with AKI; with generalized weakness and failure to thrive. He was placed on IVF with improvement of his overall status. He requires SNF to improve upon his independence with his adl. He is here for short term rehab with his goal to return back home. He will continue to be followed for his chronic illnesses including: Benign hypertension with coincident congestive heart failure /  benign hypertension with chronic kidney disease stage IV:     Chronic respiratory failure with hypoxia / COPD (chronic obstructive pulmonary disease)    Primary lung cancer right upper lobe stage IVA   Acquired hypothyroidism  Past Medical History:  Diagnosis Date   Arthritis    Hypertension    Hyperthyroidism    lung ca 11/2020    Past Surgical History:  Procedure Laterality Date   COLONOSCOPY N/A 09/02/2016   Procedure: COLONOSCOPY;  Surgeon: Claudis RAYMOND Rivet, MD;  Location: [facility];  Service: Endoscopy;  Laterality: N/A;  830   NO PAST SURGERIES     POLYPECTOMY  09/02/2016   Procedure: POLYPECTOMY;  Surgeon: Claudis RAYMOND Rivet, MD;  Location: [facility];  Service: Endoscopy;;    Social History   Socioeconomic History   Marital status: Divorced    Spouse name: Not on file   Number of children: Not on file   Years of education: Not on file   Highest education level: Not on file  Occupational History   Not on file  Tobacco Use   Smoking status:  Former    Current packs/day: 0.00    Average packs/day: 1.5 packs/day for 40.0 years (60.0 ttl pk-yrs)    Types: Cigarettes    Start date: 54    Quit date: 2000    Years since quitting: 25.0   Smokeless tobacco: Never  Vaping Use   Vaping status: Never Used  Substance and Sexual Activity   Alcohol use: No   Drug use: No   Sexual activity: Not on file  Other Topics Concern   Not on file  Social History Narrative   Not on file   Social Drivers of Health   Financial Resource Strain: Not on file  Food Insecurity: No Food Insecurity (10/07/2023)   Hunger Vital Sign    Worried About Running Out of Food in the Last Year: Never true    Ran Out of Food in the Last Year: Never true  Transportation Needs: No Transportation Needs (10/07/2023)   PRAPARE - Administrator, Civil Service (Medical): No    Lack of Transportation (Non-Medical): No  Physical Activity: Not on file  Stress: Not on file  Social Connections: Unknown (10/08/2023)   Social Connection and Isolation Panel [NHANES]    Frequency of Communication with Friends and Family: More than three times a week    Frequency of Social Gatherings with Friends and Family: More than three times a week    Attends Religious Services: Patient unable  to answer    Active Member of Clubs or Organizations: No    Attends Club or Organization Meetings: Never    Marital Status: Widowed  Intimate Partner Violence: Not At Risk (10/07/2023)   Humiliation, Afraid, Rape, and Kick questionnaire    Fear of Current or Ex-Partner: No    Emotionally Abused: No    Physically Abused: No    Sexually Abused: No   Family History  Problem Relation Age of Onset   Colon cancer Other       VITAL SIGNS BP 139/75   Pulse 61   Temp (!) 97 F (36.1 C)   Resp 20   Ht 6' 2 (1.88 m)   Wt 127 lb 9.6 oz (57.9 kg)   SpO2 98%   BMI 16.38 kg/m   Outpatient Encounter Medications as of 10/12/2023  Medication Sig   amLODipine  (NORVASC ) 10 MG tablet  Take 1 tablet (10 mg total) by mouth daily. Hold for blood pressure reading <130   buPROPion  ER (WELLBUTRIN  SR) 100 MG 12 hr tablet Take 100 mg by mouth 2 (two) times daily.   calcium  carbonate (OSCAL) 1500 (600 Ca) MG TABS tablet Take 500 mg by mouth daily with breakfast. For calcium  supplement   feeding supplement (ENSURE ENLIVE / ENSURE PLUS) LIQD Take 237 mLs by mouth 3 (three) times daily between meals.   ferrous sulfate  325 (65 FE) MG EC tablet Take 1 tablet (325 mg total) by mouth 3 (three) times a week.   fluticasone -salmeterol (ADVAIR) 250-50 MCG/ACT AEPB Inhale 1 puff into the lungs in the morning and at bedtime.   furosemide  (LASIX ) 40 MG tablet Take 1 tablet (40 mg total) by mouth daily.   gabapentin  (NEURONTIN ) 250 MG/5ML solution Take 3 mLs (150 mg total) by mouth at bedtime.   labetalol  (NORMODYNE ) 200 MG tablet Take 1 tablet (200 mg total) by mouth 2 (two) times daily. Hold for blood pressure reading <130   levothyroxine  (SYNTHROID ) 150 MCG tablet Take 1 tablet (150 mcg total) by mouth daily.   Protein (PROSOURCE) PACK Take 1 each by mouth 3 (three) times daily. (Patient taking differently: Take 1 each by mouth 3 (three) times daily. Now Ensures at home)   tadalafil  (CIALIS ) 5 MG tablet Take 1 tablet (5 mg total) by mouth daily.   tamsulosin  (FLOMAX ) 0.4 MG CAPS capsule Take 1 capsule (0.4 mg total) by mouth daily.   Tiotropium Bromide  Monohydrate (SPIRIVA  RESPIMAT) 1.25 MCG/ACT AERS Inhale 2 puffs into the lungs daily at 2 PM.   umeclidinium bromide  (INCRUSE ELLIPTA ) 62.5 MCG/ACT AEPB Inhale 1 puff into the lungs daily.   No facility-administered encounter medications on file as of 10/12/2023.     SIGNIFICANT DIAGNOSTIC EXAMS   PREVIOUS   07-07-23: wbc 5.3; hgb 10.2; hct 32.9; mcv 95.9 plt 167; glucose 98; bun 26; creat 2.04; k+ 3.9; na++ 137; ca 8.0; gfr 31; protein 7.3 albumin 2.7 mag 1.9 07-08-23: tsh 9.279 vitamin B12: 479; folate 7.5; iron  21; tibc 168 07-15-23: wbc  3.9; hgb 8.3; hct 27.1; mcv 98.2 plt 181; glucose 105; bun 39; creat 2.23; k+ 4.3; na++ 137; ca 7.7; gfr 28; mag 1.8  07-20-23: wbc 4.5; hgb 8.1; hct 26.4; mcv 100.4 plt 175; glucose 93; bun 45; creat 2.08; k+ 4.3; na++ 138; ca 7.5; gfr 30 protein 5.5 albumin 2.1; tsh 6.070 07-26-23: wbc 3.6; hgb 8.3; hct 26.7; mcv 99.6 plt 178; glucose 86; bun 50; creat 2.18; k+ 4.8; na++ 139; ca 7.8; gfr 29; protein  6.0 albumin 2.2 iron  33 tibc 201 08-20-23: wbc 5.0; hgb 9.9; hct 31.9; mcv 97.3 plt 210; glucose 94; bun 74; creat 3.13; k+ 4.6; na++ 132; ca 8.3; gfr 19; protein 7.5 albumin 2.7  08-24-23: glucose 111; bun 68; creat 2.67; k+ 4.1; na++ 136; ca 7.8; gfr 22  TODAY  10-07-23: wbc 5.3; hgb 10.5; hct 32.5; mcv 90.3 plt 168; glucose 84; bun 66; creat 3.78; k+ 4.2; na++ 134; ca 8.7; gfr 15; protein 7.8; albumin 3.1; urine culture: proteus mirabilis  10-09-23: wbc 4.2; hgb 8.8; hct 26.8; mcv 89.0 plt 165; glucose 100; bun 66; creat 2.72; k+ 3.5; na++ 131; ca 8.1; gfr 22; mag 1.7   Review of Systems  Constitutional:  Positive for malaise/fatigue.  Respiratory:  Negative for cough and shortness of breath.   Cardiovascular:  Positive for leg swelling. Negative for chest pain and palpitations.  Gastrointestinal:  Negative for abdominal pain, constipation and heartburn.  Musculoskeletal:  Negative for back pain, joint pain and myalgias.  Skin: Negative.   Neurological:  Negative for dizziness.  Psychiatric/Behavioral:  The patient is not nervous/anxious.    Physical Exam Constitutional:      General: He is not in acute distress.    Appearance: He is underweight. He is not diaphoretic.  Neck:     Thyroid : No thyromegaly.  Cardiovascular:     Rate and Rhythm: Normal rate and regular rhythm.     Pulses: Normal pulses.     Heart sounds: Normal heart sounds.  Pulmonary:     Effort: Pulmonary effort is normal. No respiratory distress.     Breath sounds: Normal breath sounds.  Abdominal:     General: Bowel  sounds are normal. There is no distension.     Palpations: Abdomen is soft.     Tenderness: There is no abdominal tenderness.  Musculoskeletal:        General: Normal range of motion.     Cervical back: Neck supple.  Lymphadenopathy:     Cervical: No cervical adenopathy.  Skin:    General: Skin is warm and dry.  Neurological:     Mental Status: He is alert. Mental status is at baseline.  Psychiatric:        Mood and Affect: Mood normal.    ASSESSMENT/ PLAN:  TODAY  Acute metabolic encephalopathy: mentation has improved with IVF. Will continue therapy as directed to improve upon his ADL independence.   2. Benign hypertension with coincident congestive heart failure /  benign hypertension with chronic kidney disease stage IV: b/p 139/75 will continue norvasc  10 mg daily and labetolol 200 mg twice daily   3. Chronic respiratory failure with hypoxia / COPD (chronic obstructive pulmonary disease) will continue spiriva  respmit 1.25 mcg 2 puffs daily; advair 250/50 1 puff twice daily; incruse 62.5 mcg 1 puff daily   4. Primary lung cancer right upper lobe stage IVA   5. Acquired hypothyroidism: tsh 9.279 will continue synthroid  150 mcg daily   6. Benign prostatic hyperplasia without symptoms: will continue flomax  0.4 mg daily and cializ 5 mg daily   7. Anemia of chronic disease: hgb 8.8; iron  21; will continue iron  three times weekly   8. Chronic pain syndrome: will continue gabapentin  150 mg daily   9.  Severe protein calorie malnutrition: albumin 3.1 will continue prosource 30 mL and ensure three times daily   10. Chronic diastolic congestive heart failure: will continue lasix  40 mg daily   11. Depression, chronic: will continue wellbutrin  ER 100  mg twice daily   12 proteus mirabilis UTI: will continue rocephin  500 mg daily for 3 days.   13. Aortic atherosclerosis (ct 07-07-23) is not on statin due to advanced age.   14. Chronic kidney disease stage 4: bun 66; creat 2.72 gfr  22     Barnie Seip NP [facility]  call [phone]

## 2023-10-13 ENCOUNTER — Non-Acute Institutional Stay (SKILLED_NURSING_FACILITY): Payer: Medicare HMO | Admitting: Internal Medicine

## 2023-10-13 ENCOUNTER — Encounter: Payer: Self-pay | Admitting: Internal Medicine

## 2023-10-13 DIAGNOSIS — J9611 Chronic respiratory failure with hypoxia: Secondary | ICD-10-CM | POA: Diagnosis not present

## 2023-10-13 DIAGNOSIS — G9341 Metabolic encephalopathy: Secondary | ICD-10-CM

## 2023-10-13 DIAGNOSIS — C3411 Malignant neoplasm of upper lobe, right bronchus or lung: Secondary | ICD-10-CM | POA: Diagnosis not present

## 2023-10-13 DIAGNOSIS — N179 Acute kidney failure, unspecified: Secondary | ICD-10-CM

## 2023-10-13 DIAGNOSIS — E039 Hypothyroidism, unspecified: Secondary | ICD-10-CM

## 2023-10-13 DIAGNOSIS — R627 Adult failure to thrive: Secondary | ICD-10-CM | POA: Diagnosis not present

## 2023-10-13 NOTE — Progress Notes (Signed)
NURSING HOME LOCATION:  [facility] ROOM NUMBER: [id number] P  CODE STATUS:  DNR  PCP:  Micael Hampshire FNP  This is a comprehensive admission note to this SNFperformed on this date less than 30 days from date of admission. Included are preadmission medical/surgical history; reconciled medication list; family history; social history and comprehensive review of systems.  Corrections and additions to the records were documented. Comprehensive physical exam was also performed. Additionally a clinical summary was entered for each active diagnosis pertinent to this admission in the Problem List to enhance continuity of care.  HPI: He was hospitalized 1/9 - 10/11/2023 presenting to the ED with 2-days of generalized weakness and inability to stand.  Apparently his baseline had been ambulation with a walker.  He was diagnosed with acute metabolic encephalopathy in the setting of AKI with generalized failure to thrive. His initial creatinine was 3.78 with a GFR of 15; with rehydration this improved to 2.72 and GFR 22 indicating CKD stage IV.  Prerenal azotemia component was suggested by BUN of 66.  Albumin was 2.9 with normal total protein 7.3.  There was progression of his anemia with initial H/H of 10.5/30.5 dropping to 8.8/26.8 at discharge. Hyponatremia was present with an initial value of 134 and final value 131.  Glucoses while hospitalized ranged from a low of 57 up to a high of 129. With improvement of renal function he was felt to be stable enough for discharge to SNF for rehab.  Past medical and surgical history: Includes essential hypertension, hypothyroidism, history of lung cancer, chronic respiratory failure with hypoxia on supplemental oxygen  Family history: reviewed, non contributory due to advanced age.  Social history: non drinker; 60 pack year of smoking.   Review of systems: Clinical neurocognitive deficits made validity of responses questionable , compromising ROS  completion.  He states that he had been in the hospital for "swelling in my legs."  He states that he was not told why he had swelling.  He states he cannot walk because of pain and weakness in his lower extremities.  He states that his knees "go out of joint."  At this time he states his breathing is "pretty good."  He does have numbness and tingling in his feet.  He seems to indicate when he urinates he will have stool incontinence.  He could not differentiate whether he was having constipation or diarrhea.  Constitutional: No fever, significant weight change  Eyes: No redness, discharge, pain, vision change ENT/mouth: No nasal congestion, purulent discharge, earache, change in hearing, sore throat  Cardiovascular: No chest pain, palpitations, paroxysmal nocturnal dyspnea, claudication, edema  Respiratory: No hemoptysis, significant snoring, apnea Gastrointestinal: No heartburn, dysphagia, abdominal pain, nausea /vomiting, rectal bleeding, melena Genitourinary: No dysuria, hematuria, pyuria, incontinence, nocturia Musculoskeletal: No joint stiffness, joint swelling, weakness, pain Dermatologic: No rash, pruritus, change in appearance of skin Neurologic: No dizziness, headache, syncope, seizures Psychiatric: No significant anxiety, depression, insomnia, anorexia Endocrine: No change in hair/skin/nails, excessive thirst, excessive hunger, excessive urination  Hematologic/lymphatic: No significant bruising, lymphadenopathy, abnormal bleeding Allergy/immunology: No itchy/watery eyes, significant sneezing, urticaria, angioedema  Physical exam:  Pertinent or positive findings: He was initially asleep and somewhat difficult to arouse.  He was exhibiting hypopnea without snoring or frank apnea.  Upon awakening his responses were slow.  He appears almost cachectic. His gray hair is full and unkempt.  He has a Optician, dispensing.  Arcus senilis is present. He is edentulous. Splotchy vitiligo is present over  the ears & eyelids. Otherwise generalized facial vitiligo is present.  This makes his eyelids appear to be hyperpigmented in a splotchy distribution. There is a  thin linear scar obliquely over the left lateral forehead.  Heart sounds are markedly distant.  Breath sounds are also decreased.  Pedal pulses are not palpable.  There is no peripheral edema. His limbs are markedly atrophic and there is dramatic interosseous wasting.    General appearance: no acute distress, increased work of breathing is present.   Lymphatic: No lymphadenopathy about the head, neck, axilla. Eyes: No conjunctival inflammation or lid edema is present. There is no scleral icterus. Ears:  External ear exam shows no significant lesions or deformities.   Nose:  External nasal examination shows no deformity or inflammation. Nasal mucosa are pink and moist without lesions, exudates Neck:  No thyromegaly, masses, tenderness noted.    Heart:  No gallop, murmur, click, rub.  Lungs:  without wheezes, rhonchi, rales, rubs. Abdomen: Bowel sounds are normal.  Abdomen is soft and nontender with no organomegaly, hernias, masses. GU: Deferred  Neurologic exam: Balance, Rhomberg, finger to nose testing could not be completed due to clinical state Skin: Warm & dry w/o tenting. No significant rash.  See clinical summary under each active problem in the Problem List with associated updated therapeutic plan

## 2023-10-13 NOTE — Assessment & Plan Note (Addendum)
O2 sats are excellent on 2 L; he has "silent lungs " of advanced COPD/emphysema.

## 2023-10-13 NOTE — Assessment & Plan Note (Signed)
 He has no concept of why he was hospitalized stating "swelling in my legs."  And cannot give me the etiology of the edema.  He is an incredibly poor historian.MMSE will be performed at the SNF.  B12 was normal at 479.  TSH should be updated and RPR/VDRL performed for completeness sake.

## 2023-10-13 NOTE — Patient Instructions (Signed)
 See assessment and plan under each diagnosis in the problem list and acutely for this visit

## 2023-10-13 NOTE — Assessment & Plan Note (Addendum)
The most recent TSH on record was 6.070 on 07/20/2023.  This had improved from a prior value of 9.279.  This will be updated when BMET & CBC rechecked.

## 2023-10-13 NOTE — Assessment & Plan Note (Signed)
 Albumin is 2.9 and total protein 7.3.  Despite the normal total protein he appears cachectic with dramatic limb atrophy and interosseous wasting.  Nutritionist to evaluate at the SNF.

## 2023-10-14 NOTE — Assessment & Plan Note (Signed)
Med list reviewed; no change in meds or dosages at this time but continue to monitor.

## 2023-10-14 NOTE — Assessment & Plan Note (Signed)
08/31/2023 PET scan report & 09/14/23 Oncology note reviewed; RUL & suprahilar changes post radiation w/o definite evidence of recurrence. CT monitor 11/2023.

## 2023-10-24 ENCOUNTER — Encounter (HOSPITAL_COMMUNITY)
Admission: RE | Admit: 2023-10-24 | Discharge: 2023-10-24 | Disposition: A | Discharge status: Home / Self Care | Payer: Medicare HMO | Source: Skilled Nursing Facility | Attending: Adult Health | Admitting: Adult Health

## 2023-10-24 DIAGNOSIS — N390 Urinary tract infection, site not specified: Secondary | ICD-10-CM | POA: Insufficient documentation

## 2023-10-24 LAB — URINALYSIS, ROUTINE W REFLEX MICROSCOPIC
Bacteria, UA: NONE SEEN
Bilirubin Urine: NEGATIVE
Glucose, UA: NEGATIVE mg/dL
Hgb urine dipstick: NEGATIVE
Ketones, ur: NEGATIVE mg/dL
Nitrite: NEGATIVE
Protein, ur: NEGATIVE mg/dL
Specific Gravity, Urine: 1.01 (ref 1.005–1.030)
WBC, UA: >50 WBC/hpf (ref 0–5)
pH: 6 (ref 5.0–8.0)

## 2023-10-25 ENCOUNTER — Emergency Department (HOSPITAL_COMMUNITY): Payer: Medicare HMO

## 2023-10-25 ENCOUNTER — Inpatient Hospital Stay (HOSPITAL_COMMUNITY)
Admission: EM | Admit: 2023-10-25 | Discharge: 2023-10-29 | DRG: 193 | Disposition: A | Discharge status: Skilled Nursing Facility | Payer: Medicare HMO | Source: Skilled Nursing Facility | Attending: Internal Medicine | Admitting: Internal Medicine

## 2023-10-25 ENCOUNTER — Encounter (HOSPITAL_COMMUNITY): Payer: Self-pay | Admitting: Emergency Medicine

## 2023-10-25 ENCOUNTER — Other Ambulatory Visit: Payer: Self-pay

## 2023-10-25 ENCOUNTER — Emergency Department (HOSPITAL_COMMUNITY)
Admission: EM | Admit: 2023-10-25 | Discharge: 2023-10-25 | Disposition: A | Discharge status: Home / Self Care | Payer: Medicare HMO | Source: Home / Self Care | Attending: Emergency Medicine | Admitting: Emergency Medicine

## 2023-10-25 DIAGNOSIS — R3914 Feeling of incomplete bladder emptying: Secondary | ICD-10-CM | POA: Diagnosis not present

## 2023-10-25 DIAGNOSIS — R1312 Dysphagia, oropharyngeal phase: Secondary | ICD-10-CM | POA: Diagnosis not present

## 2023-10-25 DIAGNOSIS — C3412 Malignant neoplasm of upper lobe, left bronchus or lung: Secondary | ICD-10-CM | POA: Diagnosis not present

## 2023-10-25 DIAGNOSIS — R4781 Slurred speech: Secondary | ICD-10-CM | POA: Diagnosis present

## 2023-10-25 DIAGNOSIS — N35911 Unspecified urethral stricture, male, meatal: Secondary | ICD-10-CM | POA: Diagnosis not present

## 2023-10-25 DIAGNOSIS — I130 Hypertensive heart and chronic kidney disease with heart failure and stage 1 through stage 4 chronic kidney disease, or unspecified chronic kidney disease: Secondary | ICD-10-CM | POA: Diagnosis present

## 2023-10-25 DIAGNOSIS — N179 Acute kidney failure, unspecified: Secondary | ICD-10-CM | POA: Diagnosis present

## 2023-10-25 DIAGNOSIS — Z9981 Dependence on supplemental oxygen: Secondary | ICD-10-CM

## 2023-10-25 DIAGNOSIS — I700 Atherosclerosis of aorta: Secondary | ICD-10-CM | POA: Diagnosis not present

## 2023-10-25 DIAGNOSIS — R269 Unspecified abnormalities of gait and mobility: Secondary | ICD-10-CM | POA: Diagnosis present

## 2023-10-25 DIAGNOSIS — I6523 Occlusion and stenosis of bilateral carotid arteries: Secondary | ICD-10-CM | POA: Diagnosis not present

## 2023-10-25 DIAGNOSIS — K5900 Constipation, unspecified: Secondary | ICD-10-CM | POA: Diagnosis not present

## 2023-10-25 DIAGNOSIS — E860 Dehydration: Secondary | ICD-10-CM | POA: Insufficient documentation

## 2023-10-25 DIAGNOSIS — I110 Hypertensive heart disease with heart failure: Secondary | ICD-10-CM | POA: Diagnosis not present

## 2023-10-25 DIAGNOSIS — Z800 Family history of malignant neoplasm of digestive organs: Secondary | ICD-10-CM

## 2023-10-25 DIAGNOSIS — R498 Other voice and resonance disorders: Secondary | ICD-10-CM | POA: Diagnosis not present

## 2023-10-25 DIAGNOSIS — E43 Unspecified severe protein-calorie malnutrition: Secondary | ICD-10-CM | POA: Diagnosis present

## 2023-10-25 DIAGNOSIS — J189 Pneumonia, unspecified organism: Principal | ICD-10-CM | POA: Diagnosis present

## 2023-10-25 DIAGNOSIS — R000 Tachycardia, unspecified: Secondary | ICD-10-CM | POA: Diagnosis not present

## 2023-10-25 DIAGNOSIS — N35811 Other urethral stricture, male, meatal: Secondary | ICD-10-CM | POA: Diagnosis not present

## 2023-10-25 DIAGNOSIS — Z923 Personal history of irradiation: Secondary | ICD-10-CM

## 2023-10-25 DIAGNOSIS — Z79899 Other long term (current) drug therapy: Secondary | ICD-10-CM | POA: Insufficient documentation

## 2023-10-25 DIAGNOSIS — I10 Essential (primary) hypertension: Secondary | ICD-10-CM | POA: Insufficient documentation

## 2023-10-25 DIAGNOSIS — F0152 Vascular dementia, unspecified severity, with psychotic disturbance: Secondary | ICD-10-CM | POA: Diagnosis not present

## 2023-10-25 DIAGNOSIS — Z66 Do not resuscitate: Secondary | ICD-10-CM | POA: Diagnosis not present

## 2023-10-25 DIAGNOSIS — Z79890 Hormone replacement therapy (postmenopausal): Secondary | ICD-10-CM

## 2023-10-25 DIAGNOSIS — J440 Chronic obstructive pulmonary disease with acute lower respiratory infection: Secondary | ICD-10-CM | POA: Diagnosis present

## 2023-10-25 DIAGNOSIS — J9611 Chronic respiratory failure with hypoxia: Secondary | ICD-10-CM | POA: Diagnosis not present

## 2023-10-25 DIAGNOSIS — N1330 Unspecified hydronephrosis: Secondary | ICD-10-CM | POA: Diagnosis not present

## 2023-10-25 DIAGNOSIS — Z7951 Long term (current) use of inhaled steroids: Secondary | ICD-10-CM

## 2023-10-25 DIAGNOSIS — R636 Underweight: Secondary | ICD-10-CM | POA: Diagnosis not present

## 2023-10-25 DIAGNOSIS — N400 Enlarged prostate without lower urinary tract symptoms: Secondary | ICD-10-CM | POA: Diagnosis present

## 2023-10-25 DIAGNOSIS — N401 Enlarged prostate with lower urinary tract symptoms: Secondary | ICD-10-CM | POA: Diagnosis present

## 2023-10-25 DIAGNOSIS — D631 Anemia in chronic kidney disease: Secondary | ICD-10-CM | POA: Diagnosis present

## 2023-10-25 DIAGNOSIS — R5381 Other malaise: Secondary | ICD-10-CM | POA: Diagnosis present

## 2023-10-25 DIAGNOSIS — R404 Transient alteration of awareness: Secondary | ICD-10-CM | POA: Diagnosis not present

## 2023-10-25 DIAGNOSIS — Z9221 Personal history of antineoplastic chemotherapy: Secondary | ICD-10-CM

## 2023-10-25 DIAGNOSIS — E039 Hypothyroidism, unspecified: Secondary | ICD-10-CM | POA: Diagnosis present

## 2023-10-25 DIAGNOSIS — M6281 Muscle weakness (generalized): Secondary | ICD-10-CM | POA: Diagnosis not present

## 2023-10-25 DIAGNOSIS — E875 Hyperkalemia: Secondary | ICD-10-CM | POA: Diagnosis present

## 2023-10-25 DIAGNOSIS — R488 Other symbolic dysfunctions: Secondary | ICD-10-CM | POA: Diagnosis not present

## 2023-10-25 DIAGNOSIS — Z85118 Personal history of other malignant neoplasm of bronchus and lung: Secondary | ICD-10-CM | POA: Insufficient documentation

## 2023-10-25 DIAGNOSIS — D638 Anemia in other chronic diseases classified elsewhere: Secondary | ICD-10-CM | POA: Diagnosis not present

## 2023-10-25 DIAGNOSIS — Z886 Allergy status to analgesic agent status: Secondary | ICD-10-CM

## 2023-10-25 DIAGNOSIS — I3139 Other pericardial effusion (noninflammatory): Secondary | ICD-10-CM | POA: Diagnosis not present

## 2023-10-25 DIAGNOSIS — R222 Localized swelling, mass and lump, trunk: Secondary | ICD-10-CM | POA: Diagnosis not present

## 2023-10-25 DIAGNOSIS — N390 Urinary tract infection, site not specified: Secondary | ICD-10-CM | POA: Insufficient documentation

## 2023-10-25 DIAGNOSIS — R410 Disorientation, unspecified: Principal | ICD-10-CM

## 2023-10-25 DIAGNOSIS — B964 Proteus (mirabilis) (morganii) as the cause of diseases classified elsewhere: Secondary | ICD-10-CM | POA: Diagnosis present

## 2023-10-25 DIAGNOSIS — W06XXXA Fall from bed, initial encounter: Secondary | ICD-10-CM | POA: Insufficient documentation

## 2023-10-25 DIAGNOSIS — D6489 Other specified anemias: Secondary | ICD-10-CM | POA: Diagnosis not present

## 2023-10-25 DIAGNOSIS — C3491 Malignant neoplasm of unspecified part of right bronchus or lung: Secondary | ICD-10-CM | POA: Diagnosis not present

## 2023-10-25 DIAGNOSIS — J441 Chronic obstructive pulmonary disease with (acute) exacerbation: Secondary | ICD-10-CM | POA: Diagnosis not present

## 2023-10-25 DIAGNOSIS — R9431 Abnormal electrocardiogram [ECG] [EKG]: Secondary | ICD-10-CM | POA: Diagnosis present

## 2023-10-25 DIAGNOSIS — G894 Chronic pain syndrome: Secondary | ICD-10-CM | POA: Diagnosis not present

## 2023-10-25 DIAGNOSIS — N134 Hydroureter: Secondary | ICD-10-CM | POA: Diagnosis not present

## 2023-10-25 DIAGNOSIS — F4321 Adjustment disorder with depressed mood: Secondary | ICD-10-CM | POA: Diagnosis not present

## 2023-10-25 DIAGNOSIS — R4182 Altered mental status, unspecified: Secondary | ICD-10-CM | POA: Diagnosis not present

## 2023-10-25 DIAGNOSIS — Z681 Body mass index (BMI) 19 or less, adult: Secondary | ICD-10-CM | POA: Diagnosis not present

## 2023-10-25 DIAGNOSIS — I5032 Chronic diastolic (congestive) heart failure: Secondary | ICD-10-CM | POA: Diagnosis present

## 2023-10-25 DIAGNOSIS — M1990 Unspecified osteoarthritis, unspecified site: Secondary | ICD-10-CM | POA: Diagnosis present

## 2023-10-25 DIAGNOSIS — R262 Difficulty in walking, not elsewhere classified: Secondary | ICD-10-CM | POA: Diagnosis not present

## 2023-10-25 DIAGNOSIS — G9341 Metabolic encephalopathy: Secondary | ICD-10-CM | POA: Diagnosis present

## 2023-10-25 DIAGNOSIS — N139 Obstructive and reflux uropathy, unspecified: Secondary | ICD-10-CM | POA: Diagnosis not present

## 2023-10-25 DIAGNOSIS — R0602 Shortness of breath: Secondary | ICD-10-CM | POA: Diagnosis not present

## 2023-10-25 DIAGNOSIS — R1310 Dysphagia, unspecified: Secondary | ICD-10-CM | POA: Diagnosis present

## 2023-10-25 DIAGNOSIS — R339 Retention of urine, unspecified: Secondary | ICD-10-CM | POA: Diagnosis not present

## 2023-10-25 DIAGNOSIS — Z8546 Personal history of malignant neoplasm of prostate: Secondary | ICD-10-CM

## 2023-10-25 DIAGNOSIS — J449 Chronic obstructive pulmonary disease, unspecified: Secondary | ICD-10-CM | POA: Diagnosis present

## 2023-10-25 DIAGNOSIS — R7989 Other specified abnormal findings of blood chemistry: Secondary | ICD-10-CM

## 2023-10-25 DIAGNOSIS — R918 Other nonspecific abnormal finding of lung field: Secondary | ICD-10-CM | POA: Diagnosis not present

## 2023-10-25 DIAGNOSIS — R509 Fever, unspecified: Secondary | ICD-10-CM | POA: Diagnosis not present

## 2023-10-25 DIAGNOSIS — J9601 Acute respiratory failure with hypoxia: Secondary | ICD-10-CM | POA: Diagnosis not present

## 2023-10-25 DIAGNOSIS — N184 Chronic kidney disease, stage 4 (severe): Secondary | ICD-10-CM | POA: Diagnosis present

## 2023-10-25 DIAGNOSIS — N136 Pyonephrosis: Secondary | ICD-10-CM | POA: Diagnosis not present

## 2023-10-25 DIAGNOSIS — Z743 Need for continuous supervision: Secondary | ICD-10-CM | POA: Diagnosis not present

## 2023-10-25 DIAGNOSIS — Z87891 Personal history of nicotine dependence: Secondary | ICD-10-CM

## 2023-10-25 DIAGNOSIS — F0390 Unspecified dementia without behavioral disturbance: Secondary | ICD-10-CM | POA: Diagnosis present

## 2023-10-25 DIAGNOSIS — R627 Adult failure to thrive: Secondary | ICD-10-CM | POA: Diagnosis not present

## 2023-10-25 DIAGNOSIS — J439 Emphysema, unspecified: Secondary | ICD-10-CM | POA: Diagnosis not present

## 2023-10-25 LAB — CBC WITH DIFFERENTIAL/PLATELET
Abs Immature Granulocytes: 0.03 10*3/uL (ref 0.00–0.07)
Abs Immature Granulocytes: 0.03 10*3/uL (ref 0.00–0.07)
Basophils Absolute: 0 10*3/uL (ref 0.0–0.1)
Basophils Absolute: 0 10*3/uL (ref 0.0–0.1)
Basophils Relative: 0 %
Basophils Relative: 0 %
Eosinophils Absolute: 0 10*3/uL (ref 0.0–0.5)
Eosinophils Absolute: 0 10*3/uL (ref 0.0–0.5)
Eosinophils Relative: 0 %
Eosinophils Relative: 0 %
HCT: 32.1 % — ABNORMAL LOW (ref 39.0–52.0)
HCT: 28.4 % — ABNORMAL LOW (ref 39.0–52.0)
Hemoglobin: 9.9 g/dL — ABNORMAL LOW (ref 13.0–17.0)
Hemoglobin: 9 g/dL — ABNORMAL LOW (ref 13.0–17.0)
Immature Granulocytes: 0 %
Immature Granulocytes: 0 %
Lymphocytes Relative: 10 %
Lymphocytes Relative: 15 %
Lymphs Abs: 0.7 10*3/uL (ref 0.7–4.0)
Lymphs Abs: 1.2 10*3/uL (ref 0.7–4.0)
MCH: 28.7 pg (ref 26.0–34.0)
MCH: 29.1 pg (ref 26.0–34.0)
MCHC: 30.8 g/dL (ref 30.0–36.0)
MCHC: 31.7 g/dL (ref 30.0–36.0)
MCV: 93 fL (ref 80.0–100.0)
MCV: 91.9 fL (ref 80.0–100.0)
Monocytes Absolute: 0.3 10*3/uL (ref 0.1–1.0)
Monocytes Absolute: 0.5 10*3/uL (ref 0.1–1.0)
Monocytes Relative: 5 %
Monocytes Relative: 6 %
Neutro Abs: 6.1 10*3/uL (ref 1.7–7.7)
Neutro Abs: 5.8 10*3/uL (ref 1.7–7.7)
Neutrophils Relative %: 85 %
Neutrophils Relative %: 79 %
Platelets: 235 10*3/uL (ref 150–400)
Platelets: 233 10*3/uL (ref 150–400)
RBC: 3.45 MIL/uL — ABNORMAL LOW (ref 4.22–5.81)
RBC: 3.09 MIL/uL — ABNORMAL LOW (ref 4.22–5.81)
RDW: 18.6 % — ABNORMAL HIGH (ref 11.5–15.5)
RDW: 18.7 % — ABNORMAL HIGH (ref 11.5–15.5)
WBC: 7.2 10*3/uL (ref 4.0–10.5)
WBC: 7.5 10*3/uL (ref 4.0–10.5)
nRBC: 0 % (ref 0.0–0.2)
nRBC: 0 % (ref 0.0–0.2)

## 2023-10-25 LAB — URINALYSIS, W/ REFLEX TO CULTURE (INFECTION SUSPECTED)
Bacteria, UA: NONE SEEN
Bilirubin Urine: NEGATIVE
Glucose, UA: NEGATIVE mg/dL
Hgb urine dipstick: NEGATIVE
Ketones, ur: NEGATIVE mg/dL
Nitrite: NEGATIVE
Protein, ur: NEGATIVE mg/dL
Specific Gravity, Urine: 1.011 (ref 1.005–1.030)
pH: 6 (ref 5.0–8.0)

## 2023-10-25 LAB — COMPREHENSIVE METABOLIC PANEL
ALT: 25 U/L (ref 0–44)
ALT: 25 U/L (ref 0–44)
AST: 20 U/L (ref 15–41)
AST: 22 U/L (ref 15–41)
Albumin: 3.3 g/dL — ABNORMAL LOW (ref 3.5–5.0)
Albumin: 3.2 g/dL — ABNORMAL LOW (ref 3.5–5.0)
Alkaline Phosphatase: 73 U/L (ref 38–126)
Alkaline Phosphatase: 66 U/L (ref 38–126)
Anion gap: 9 (ref 5–15)
Anion gap: 9 (ref 5–15)
BUN: 100 mg/dL — ABNORMAL HIGH (ref 8–23)
BUN: 101 mg/dL — ABNORMAL HIGH (ref 8–23)
CO2: 25 mmol/L (ref 22–32)
CO2: 23 mmol/L (ref 22–32)
Calcium: 9 mg/dL (ref 8.9–10.3)
Calcium: 8.7 mg/dL — ABNORMAL LOW (ref 8.9–10.3)
Chloride: 106 mmol/L (ref 98–111)
Chloride: 108 mmol/L (ref 98–111)
Creatinine, Ser: 3.02 mg/dL — ABNORMAL HIGH (ref 0.61–1.24)
Creatinine, Ser: 3.09 mg/dL — ABNORMAL HIGH (ref 0.61–1.24)
GFR, Estimated: 19 mL/min — ABNORMAL LOW (ref >60)
GFR, Estimated: 19 mL/min — ABNORMAL LOW (ref >60)
Glucose, Bld: 77 mg/dL (ref 70–99)
Glucose, Bld: 91 mg/dL (ref 70–99)
Potassium: 5 mmol/L (ref 3.5–5.1)
Potassium: 5.2 mmol/L — ABNORMAL HIGH (ref 3.5–5.1)
Sodium: 140 mmol/L (ref 135–145)
Sodium: 140 mmol/L (ref 135–145)
Total Bilirubin: 0.7 mg/dL (ref 0.0–1.2)
Total Bilirubin: 0.8 mg/dL (ref 0.0–1.2)
Total Protein: 7.8 g/dL (ref 6.5–8.1)
Total Protein: 7.4 g/dL (ref 6.5–8.1)

## 2023-10-25 LAB — LACTIC ACID, PLASMA: Lactic Acid, Venous: 1 mmol/L (ref 0.5–1.9)

## 2023-10-25 LAB — MAGNESIUM: Magnesium: 2.3 mg/dL (ref 1.7–2.4)

## 2023-10-25 LAB — TROPONIN I (HIGH SENSITIVITY): Troponin I (High Sensitivity): 15 ng/L (ref <18)

## 2023-10-25 MED ORDER — SODIUM CHLORIDE 0.9 % IV SOLN
1.0000 g | Freq: Once | INTRAVENOUS | Status: AC
  Administered 2023-10-25: 1 g via INTRAVENOUS
  Filled 2023-10-25: qty 10

## 2023-10-25 MED ORDER — HALOPERIDOL LACTATE 5 MG/ML IJ SOLN
2.0000 mg | Freq: Once | INTRAMUSCULAR | Status: AC
  Administered 2023-10-25: 2 mg via INTRAVENOUS
  Filled 2023-10-25: qty 1

## 2023-10-25 MED ORDER — LACTATED RINGERS IV BOLUS (SEPSIS)
500.0000 mL | Freq: Once | INTRAVENOUS | Status: AC
  Administered 2023-10-25: 500 mL via INTRAVENOUS

## 2023-10-25 MED ORDER — DOXYCYCLINE HYCLATE 100 MG PO TABS
100.0000 mg | ORAL_TABLET | Freq: Once | ORAL | Status: DC
Start: 2023-10-25 — End: 2023-10-25
  Filled 2023-10-25: qty 1

## 2023-10-25 MED ORDER — SODIUM CHLORIDE 0.9 % IV SOLN
100.0000 mg | Freq: Once | INTRAVENOUS | Status: AC
  Administered 2023-10-25: 100 mg via INTRAVENOUS
  Filled 2023-10-25 (×2): qty 100

## 2023-10-25 MED ORDER — SODIUM CHLORIDE 0.9 % IV BOLUS
500.0000 mL | Freq: Once | INTRAVENOUS | Status: AC
  Administered 2023-10-25: 500 mL via INTRAVENOUS

## 2023-10-25 NOTE — ED Provider Notes (Signed)
[facility] AT [name] Provider Note   CSN: [id number] Arrival date & time: 10/25/23  1934     History  Chief Complaint  Patient presents with   Fever    [name] is a 89 y.o. male.   Fever Associated symptoms: confusion   Patient presents for mental status.  Medical history includes lung cancer, anemia, HTN, prostate cancer, COPD, CHF, arthritis, hypothyroidism.  He was seen in the ED this morning after he was found on the floor at nursing facility with oxygen off.  Lab work from this morning notable for creatinine increase in baseline.  He was given IVF.  Patient returns for concern of fever and delirium.  Daughter reports delirium has been ongoing for the past 2 days.  She states that the patient is typically conversant at baseline.  He may have some undiagnosed dementia due to short-term memory loss.  He is nonambulatory, which is why he is currently residing at [name].  Patient reportedly had a fever of 103.2 degrees at facility this evening.  He received Tylenol prior to arrival.     Home Medications Prior to Admission medications   Medication Sig Start Date End Date Taking? Authorizing Provider  amLODipine (NORVASC) 10 MG tablet Take 1 tablet (10 mg total) by mouth daily. Hold for blood pressure reading <130 09/01/23   [name], NP  buPROPion ER [facility] SR) 100 MG 12 hr tablet Take 100 mg by mouth 2 (two) times daily.    [provider]  calcium carbonate (OSCAL) 1500 (600 Ca) MG TABS tablet Take 500 mg by mouth daily with breakfast. For calcium supplement    [provider]  feeding supplement (ENSURE ENLIVE / ENSURE PLUS) LIQD Take 237 mLs by mouth 3 (three) times daily between meals. 10/11/23   [name], [name], DO  ferrous sulfate 325 (65 FE) MG EC tablet Take 1 tablet (325 mg total) by mouth 3 (three) times a week. 09/01/23   [name], NP  fluticasone-salmeterol (ADVAIR) 250-50 MCG/ACT  AEPB Inhale 1 puff into the lungs in the morning and at bedtime. 09/01/23   [name], NP  furosemide (LASIX) 40 MG tablet Take 1 tablet (40 mg total) by mouth daily. 09/01/23   [name], NP  gabapentin (NEURONTIN) 250 MG/5ML solution Take 3 mLs (150 mg total) by mouth at bedtime. 09/01/23   [name], NP  labetalol (NORMODYNE) 200 MG tablet Take 1 tablet (200 mg total) by mouth 2 (two) times daily. Hold for blood pressure reading <130 09/01/23   [name], NP  levothyroxine (SYNTHROID) 150 MCG tablet Take 1 tablet (150 mcg total) by mouth daily. 09/01/23   [name], NP  Protein (PROSOURCE) PACK Take 1 each by mouth 3 (three) times daily. 09/01/23   [name], NP  tadalafil (CIALIS) 5 MG tablet Take 1 tablet (5 mg total) by mouth daily. 09/01/23   [name], NP  tamsulosin (FLOMAX) 0.4 MG CAPS capsule Take 1 capsule (0.4 mg total) by mouth daily. 09/01/23   [name], NP  Tiotropium Bromide Monohydrate (SPIRIVA RESPIMAT) 1.25 MCG/ACT AERS Inhale 2 puffs into the lungs daily at 2 PM. 09/01/23   Chilton Si, [name], NP  umeclidinium bromide (INCRUSE ELLIPTA) 62.5 MCG/ACT AEPB Inhale 1 puff into the lungs daily.    [provider]      Allergies    Patient has no known allergies.  Review of Systems   Review of Systems  Unable to perform ROS: Mental status change  Constitutional:  Positive for fever.  Psychiatric/Behavioral:  Positive for confusion and hallucinations.     Physical Exam Updated Vital Signs BP (!) 171/88   Pulse (!) 104   Temp 99.1 F (37.3 C) (Rectal)   Resp (!) 22   Ht 6\' 2"  (1.88 m)   Wt 57 kg   SpO2 95%   BMI 16.13 kg/m  Physical Exam Vitals and nursing note reviewed.  Constitutional:      General: He is not in acute distress.    Appearance: Normal appearance. He is well-developed. He is ill-appearing. He is not toxic-appearing or diaphoretic.  HENT:     Head: Normocephalic and atraumatic.     Right Ear:  External ear normal.     Left Ear: External ear normal.     Nose: Nose normal.     Mouth/Throat:     Mouth: Mucous membranes are dry.  Eyes:     Extraocular Movements: Extraocular movements intact.     Conjunctiva/sclera: Conjunctivae normal.  Cardiovascular:     Rate and Rhythm: Normal rate and regular rhythm.     Heart sounds: Murmur heard.  Pulmonary:     Effort: Pulmonary effort is normal. No respiratory distress.     Breath sounds: Normal breath sounds. No stridor. No wheezing or rales.  Chest:     Chest wall: No tenderness.  Abdominal:     General: There is no distension.     Palpations: Abdomen is soft.     Tenderness: There is no abdominal tenderness.  Musculoskeletal:        General: No swelling or deformity. Normal range of motion.     Cervical back: Normal range of motion and neck supple.     Right lower leg: No edema.     Left lower leg: No edema.  Skin:    General: Skin is warm and dry.     Capillary Refill: Capillary refill takes less than 2 seconds.     Coloration: Skin is not jaundiced or pale.     Comments: Vitiligo present  Neurological:     General: No focal deficit present.     Mental Status: He is alert. He is disoriented.  Psychiatric:        Attention and Perception: He perceives visual hallucinations.        Speech: Speech is slurred.        Behavior: Behavior is uncooperative.     Comments: Appears to be responding to internal stimuli.     ED Results / Procedures / Treatments   Labs (all labs ordered are listed, but only abnormal results are displayed) Labs Reviewed  COMPREHENSIVE METABOLIC PANEL - Abnormal; Notable for the following components:      Result Value   Potassium 5.2 (*)    BUN 101 (*)    Creatinine, Ser 3.09 (*)    Calcium 8.7 (*)    Albumin 3.2 (*)    GFR, Estimated 19 (*)    All other components within normal limits  CBC WITH DIFFERENTIAL/PLATELET - Abnormal; Notable for the following components:   RBC 3.09 (*)     Hemoglobin 9.0 (*)    HCT 28.4 (*)    RDW 18.7 (*)    All other components within normal limits  CULTURE, BLOOD (ROUTINE X 2)  CULTURE, BLOOD (ROUTINE X 2)  LACTIC ACID, PLASMA  MAGNESIUM  URINALYSIS, COMPLETE (UACMP) WITH  MICROSCOPIC  TROPONIN I (HIGH SENSITIVITY)  TROPONIN I (HIGH SENSITIVITY)    EKG EKG Interpretation Date/Time:  Monday October 25 2023 21:14:55 EST Ventricular Rate:  103 PR Interval:  42 QRS Duration:  144 QT Interval:  378 QTC Calculation: 495 R Axis:   251  Text Interpretation: Sinus or ectopic atrial tachycardia RBBB and LAFB Baseline wander in lead(s) V6 Confirmed by Gloris Manchester [phone]) on 10/25/2023 10:38:31 PM  Radiology CT CHEST ABDOMEN PELVIS WO CONTRAST Result Date: 10/25/2023 CLINICAL DATA:  Fever.  Delirious.  Sepsis. EXAM: CT CHEST, ABDOMEN AND PELVIS WITHOUT CONTRAST TECHNIQUE: Multidetector CT imaging of the chest, abdomen and pelvis was performed following the standard protocol without IV contrast. RADIATION DOSE REDUCTION: This exam was performed according to the departmental dose-optimization program which includes automated exposure control, adjustment of the mA and/or kV according to patient size and/or use of iterative reconstruction technique. COMPARISON:  Same day chest radiograph; PET/CT 08/31/2023; CT chest 07/07/2023; CT chest abdomen and pelvis 04/29/2023 FINDINGS: CT CHEST FINDINGS Cardiovascular: Small pericardial effusion. Advanced aortic atherosclerotic calcification. Normal heart size. Mediastinum/Nodes: No thoracic adenopathy noting limitations of noncontrast exam and respiratory motion artifact. Trachea and esophagus are unremarkable. Lungs/Pleura: Respiratory motion obscures detail. Emphysema with bullous change in the right apex. Unchanged right upper lobe volume loss, architectural distortion, and traction bronchiectasis, presumably radiation induced. New airspace opacities in the right lower lobe posteriorly may be due to atelectasis or  pneumonia. No pleural effusion or pneumothorax. Musculoskeletal: No acute fracture. Unchanged absence of a portion of the third anterolateral right rib. CT ABDOMEN PELVIS FINDINGS Hepatobiliary: Unremarkable noncontrast appearance of the liver, gallbladder, and biliary tree. Pancreas: Unremarkable. Spleen: No acute abnormality. Adrenals/Urinary Tract: Stable adrenal glands. Since PET/CT 08/31/2023 there is new moderate hydroureter in the proximal ureter. The distal ureter is not well evaluated. Increased marked left hydroureteronephrosis. No ureteral stone. Diffuse bladder wall thickening. Stomach/Bowel: Stomach is within normal limits. Paucity of intra-abdominal fat, lack of IV contrast, and motion artifact limits assessment of the bowel wall. No evidence of obstruction. Large colonic stool burden and stool ball in the rectum. Vascular/Lymphatic: Advanced aortic atherosclerotic calcification. No definite lymphadenopathy. Reproductive: Fiducial markers along the prostate. Other: No free intraperitoneal fluid or air. Musculoskeletal: No acute fracture. IMPRESSION: 1. New airspace opacities in the right lower lobe posteriorly may be due to atelectasis or pneumonia. 2. Since PET/CT 08/31/2023 there is new moderate right hydroureter and increased left hydroureteronephrosis. No ureteral stone. Diffuse bladder wall thickening. Findings may be due to cystitis and ascending urinary tract infection. 3. Constipation with large stool ball in the rectum. Correlate for fecal impaction. Aortic Atherosclerosis (ICD10-I70.0) and Emphysema (ICD10-J43.9). Electronically Signed   By: Minerva Fester M.D.   On: 10/25/2023 21:35   CT Head Wo Contrast Result Date: 10/25/2023 CLINICAL DATA:  Mental status change.  Fever. EXAM: CT HEAD WITHOUT CONTRAST TECHNIQUE: Contiguous axial images were obtained from the base of the skull through the vertex without intravenous contrast. RADIATION DOSE REDUCTION: This exam was performed according to  the departmental dose-optimization program which includes automated exposure control, adjustment of the mA and/or kV according to patient size and/or use of iterative reconstruction technique. COMPARISON:  Head CT 10/25/2023.  MRI brain 01/02/2021. FINDINGS: Brain: No evidence of acute infarction, hemorrhage, hydrocephalus, extra-axial collection or mass lesion/mass effect. Again seen is mild diffuse atrophy and moderate periventricular white matter hypodensity, likely chronic small vessel ischemic change. Vascular: Atherosclerotic calcifications are present within the cavernous internal carotid arteries. Skull: Normal. Negative  for fracture or focal lesion. Sinuses/Orbits: No acute finding. Other: None. IMPRESSION: 1. No acute intracranial process. 2. Stable atrophy and chronic small vessel ischemic changes. Electronically Signed   By: Darliss Cheney M.D.   On: 10/25/2023 21:16   DG Chest Port 1 View Result Date: 10/25/2023 CLINICAL DATA:  Questionable sepsis EXAM: PORTABLE CHEST 1 VIEW COMPARISON:  Chest x-ray 10/25/2023.  CT of the chest 07/07/2023. FINDINGS: Parenchymal opacity in the right upper lobe is unchanged. Emphysema again noted. No new lung infiltrate or pleural effusion. No pneumothorax. The cardiomediastinal silhouette appears stable. IMPRESSION: 1. Parenchymal opacity in the right upper lobe is unchanged. 2. No new lung infiltrate or pleural effusion. Electronically Signed   By: Darliss Cheney M.D.   On: 10/25/2023 20:48   CT Cervical Spine Wo Contrast Result Date: 10/25/2023 CLINICAL DATA:  89 year old male status post fall from bed. Found down. EXAM: CT CERVICAL SPINE WITHOUT CONTRAST TECHNIQUE: Multidetector CT imaging of the cervical spine was performed without intravenous contrast. Multiplanar CT image reconstructions were also generated. RADIATION DOSE REDUCTION: This exam was performed according to the departmental dose-optimization program which includes automated exposure control,  adjustment of the mA and/or kV according to patient size and/or use of iterative reconstruction technique. COMPARISON:  Head CT today.  No prior cervical spine CT. FINDINGS: Alignment: Maintained cervical lordosis. Cervicothoracic junction alignment is within normal limits. Bilateral posterior element alignment is within normal limits. Skull base and vertebrae: Mild motion artifact, less than on the head CT today. Visualized skull base is intact. No atlanto-occipital dissociation. C1 and C2 appear intact and aligned. Heterogeneous bone mineralization although stable compared to chest CT 07/07/2023. No acute osseous abnormality identified. Soft tissues and spinal canal: No prevertebral fluid or swelling. No visible canal hematoma. Calcified cervical carotid atherosclerosis. Disc levels: Generally mild for age, age-appropriate cervical spine degeneration. Up to mild associated cervical spinal stenosis. Upper chest: Emphysema and architectural distortion in the lung apices. Visible upper thoracic levels appear intact. IMPRESSION: 1. No acute traumatic injury identified in the cervical spine. 2. Generally mild for age cervical spine degeneration. 3.  Emphysema (ICD10-J43.9). Electronically Signed   By: Odessa Fleming M.D.   On: 10/25/2023 08:55   CT Head Wo Contrast Result Date: 10/25/2023 CLINICAL DATA:  89 year old male status post fall from bed. Found down. EXAM: CT HEAD WITHOUT CONTRAST TECHNIQUE: Contiguous axial images were obtained from the base of the skull through the vertex without intravenous contrast. RADIATION DOSE REDUCTION: This exam was performed according to the departmental dose-optimization program which includes automated exposure control, adjustment of the mA and/or kV according to patient size and/or use of iterative reconstruction technique. COMPARISON:  Brain MRI 01/02/2021.  Head CT 10/07/2023. FINDINGS: Mildly motion degraded exam today. Brain: Stable cerebral volume. No midline shift,  ventriculomegaly, mass effect, evidence of mass lesion, intracranial hemorrhage or evidence of cortically based acute infarction. Stable gray-white matter differentiation throughout the brain. Patchy bilateral periventricular is stable. Vascular: Calcified atherosclerosis at the skull base. No suspicious intracranial vascular hyperdensity. Skull: Stable, intact. Sinuses/Orbits: Visualized paranasal sinuses and mastoids are stable and well aerated. Other: Postoperative changes to both globes. No acute orbit or scalp soft tissue injury is identified. IMPRESSION: Mildly motion degraded exam with No acute intracranial abnormality or acute traumatic injury identified. Electronically Signed   By: Odessa Fleming M.D.   On: 10/25/2023 08:48   DG Chest Portable 1 View Result Date: 10/25/2023 CLINICAL DATA:  89 year old male with history of altered mental status. EXAM: PORTABLE  CHEST 1 VIEW COMPARISON:  Chest x-ray 10/07/2023. FINDINGS: Chronic mass-like area of architectural distortion in the periphery of the right upper lobe with overlying of architectural distortion in the adjacent ribs, similar to the prior study, most compatible with an area of chronic postradiation mass-like fibrosis. Lungs otherwise appear clear. No pleural effusions. No pneumothorax. No evidence of pulmonary edema. Heart size is normal. Upper mediastinal contours are within normal limits. Atherosclerotic calcifications are noted in the thoracic aorta. IMPRESSION: 1. No radiographic evidence of acute cardiopulmonary disease. 2. Chronic postradiation changes in the right lung, similar to prior studies. Electronically Signed   By: Trudie Reed M.D.   On: 10/25/2023 08:05    Procedures Procedures    Medications Ordered in ED Medications  cefTRIAXone (ROCEPHIN) 1 g in sodium chloride 0.9 % 100 mL IVPB (1 g Intravenous New Bag/Given 10/25/23 2251)  doxycycline (VIBRAMYCIN) 100 mg in sodium chloride 0.9 % 250 mL IVPB (has no administration in time  range)  lactated ringers bolus 500 mL (0 mLs Intravenous Stopped 10/25/23 2209)  haloperidol lactate (HALDOL) injection 2 mg (2 mg Intravenous Given 10/25/23 2222)    ED Course/ Medical Decision Making/ A&P                                 Medical Decision Making Amount and/or Complexity of Data Reviewed Labs: ordered. Radiology: ordered.  Risk Prescription drug management. Decision regarding hospitalization.   This patient presents to the ED for concern of altered mental status, this involves an extensive number of treatment options, and is a complaint that carries with it a high risk of complications and morbidity.  The differential diagnosis includes head trauma, infection, polypharmacy, medication withdrawal, progression of neurocognitive disorder, metabolic derangements   Co morbidities that complicate the patient evaluation  lung cancer, anemia, HTN, prostate cancer, COPD, CHF, arthritis, hypothyroidism   Additional history obtained:  Additional history obtained from patient's daughter External records from outside source obtained and reviewed including EMR   Lab Tests:  I Ordered, and personally interpreted labs.  The pertinent results include: Baseline anemia, no leukocytosis, creatinine mildly elevated from baseline, BUN elevated from baseline, mild hyperkalemia   Imaging Studies ordered:  I ordered imaging studies including chest x-ray, CT head, CT of chest, abdomen, pelvis I independently visualized and interpreted imaging which showed findings concerning for right lower lobe pneumonia in addition to right hydroureteronephrosis without obstructing stone. I agree with the radiologist interpretation   Cardiac Monitoring: / EKG:  The patient was maintained on a cardiac monitor.  I personally viewed and interpreted the cardiac monitored which showed an underlying rhythm of: Sinus rhythm  Problem List / ED Course / Critical interventions / Medication  management  Patient presenting for altered mental status.  Per daughter, this has been ongoing for the past 2 days.  Facility reports fever of 103.2 degrees this evening.  He was seen in the ED this morning.  Per chart review, urinalysis showed some moderate pyuria without bacteriuria.  CMP showed baseline creatinine, however, BUN was elevated.  CBC showed no leukocytosis.  On arrival this evening, patient appears to be responding to internal stimuli.  His speech is not intelligible.  He is moving all extremities.  Daughter reports that this is consistent with his behavior for the past 2 days, although it has been intermittent.  Patient is currently afebrile.  He did receive antipyresis prior to arrival.  Workup was initiated.  Lab work shows redemonstration from this morning's findings.  His creatinine and BUN did not improve despite IV fluids earlier today.  Imaging studies showed concern for right lower lobe pneumonia in addition to left-sided hydroureteronephrosis without obstructing stone.  Patient was ordered ceftriaxone and doxycycline for empiric treatment of pneumonia.  This will also cover for potential UTI.  Urine cultures from this morning are currently pending.  He was given Haldol while in the ED for agitation.  He was subsequently sleeping peacefully.  Daughters remained at bedside.  Patient to be admitted for further management. I ordered medication including IV fluids for hydration; ceftriaxone and doxycycline for pneumonia; Haldol for agitation Reevaluation of the patient after these medicines showed that the patient improved I have reviewed the patients home medicines and have made adjustments as needed   Social Determinants of Health:  Currently residing at [facility] rehab facility         Final Clinical Impression(s) / ED Diagnoses Final diagnoses:  Delirium  Pneumonia of right lower lobe due to infectious organism  Azotemia    Rx / DC Orders ED Discharge Orders      None         Gloris Manchester, MD 10/25/23 2256

## 2023-10-25 NOTE — ED Triage Notes (Signed)
Pt arrived via [facility] from [facility] for [facility] and a fall out of bed, was found on the floor with his oxygen off . Per EMS, the bed was 6 ft off the ground. Pt on 2 L [place] at baseline. Per [facility], pt had slurred speech and last and last week was c/o back pain and urinary frequency, confusion, unsure if he has a UTI. Per EMS pt follows all commands

## 2023-10-25 NOTE — H&P (Incomplete)
History and Physical    Patient: [name]:[id number] DOB: 11/30/1932 DOA: 10/25/2023 DOS: the patient was seen and examined on 10/25/2023 PCP: Micael Hampshire, FNP  Patient coming from: {Point_of_Origin:[address]}  Chief Complaint:  Chief Complaint  Patient presents with  . Fever   HPI: [name] is a 89 y.o. male with medical history significant of ***  Review of Systems: {ROS_Text:26778} Past Medical History:  Diagnosis Date  . Arthritis   . Hypertension   . Hyperthyroidism   . lung ca 11/2020   Past Surgical History:  Procedure Laterality Date  . COLONOSCOPY N/A 09/02/2016   Procedure: COLONOSCOPY;  Surgeon: [name], MD;  Location: [facility];  Service: Endoscopy;  Laterality: N/A;  830  . NO PAST SURGERIES    . POLYPECTOMY  09/02/2016   Procedure: POLYPECTOMY;  Surgeon: [name], MD;  Location: [facility];  Service: Endoscopy;;   Social History:  reports that he quit smoking about 25 years ago. His smoking use included cigarettes. He started smoking about 65 years ago. He has a 60 pack-year smoking history. He has never used smokeless tobacco. He reports that he does not drink alcohol and does not use drugs.  No Known Allergies  Family History  Problem Relation Age of Onset  . Colon cancer Other     Prior to Admission medications   Medication Sig Start Date End Date Taking? Authorizing Provider  amLODipine (NORVASC) 10 MG tablet Take 1 tablet (10 mg total) by mouth daily. Hold for blood pressure reading <130 09/01/23   [name], NP  buPROPion ER [facility] SR) 100 MG 12 hr tablet Take 100 mg by mouth 2 (two) times daily.    [provider]  calcium carbonate (OSCAL) 1500 (600 Ca) MG TABS tablet Take 500 mg by mouth daily with breakfast. For calcium supplement    [provider]  feeding supplement (ENSURE ENLIVE / ENSURE PLUS) LIQD Take 237 mLs by mouth 3 (three) times daily between meals. 10/11/23   [name],  [name], DO  ferrous sulfate 325 (65 FE) MG EC tablet Take 1 tablet (325 mg total) by mouth 3 (three) times a week. 09/01/23   [name], NP  fluticasone-salmeterol (ADVAIR) 250-50 MCG/ACT AEPB Inhale 1 puff into the lungs in the morning and at bedtime. 09/01/23   [name], NP  furosemide (LASIX) 40 MG tablet Take 1 tablet (40 mg total) by mouth daily. 09/01/23   [name], NP  gabapentin (NEURONTIN) 250 MG/5ML solution Take 3 mLs (150 mg total) by mouth at bedtime. 09/01/23   [name], NP  labetalol (NORMODYNE) 200 MG tablet Take 1 tablet (200 mg total) by mouth 2 (two) times daily. Hold for blood pressure reading <130 09/01/23   [name], NP  levothyroxine (SYNTHROID) 150 MCG tablet Take 1 tablet (150 mcg total) by mouth daily. 09/01/23   [name], NP  Protein (PROSOURCE) PACK Take 1 each by mouth 3 (three) times daily. 09/01/23   [name], NP  tadalafil (CIALIS) 5 MG tablet Take 1 tablet (5 mg total) by mouth daily. 09/01/23   [name], NP  tamsulosin (FLOMAX) 0.4 MG CAPS capsule Take 1 capsule (0.4 mg total) by mouth daily. 09/01/23   [name], NP  Tiotropium Bromide Monohydrate (SPIRIVA RESPIMAT) 1.25 MCG/ACT AERS Inhale 2 puffs into the lungs daily at 2 PM. 09/01/23   Chilton Si, [name], NP  umeclidinium  bromide (INCRUSE ELLIPTA) 62.5 MCG/ACT AEPB Inhale 1 puff into the lungs daily.    [provider]    Physical Exam: Vitals:   10/25/23 1937 10/25/23 1948 10/25/23 2000 10/25/23 2215  BP:  (!) 152/84 (!) 159/83 (!) 171/88  Pulse:  97 100 (!) 104  Resp:  (!) 22 20 (!) 22  Temp:  98.3 F (36.8 C) 99.1 F (37.3 C)   TempSrc:  Oral Rectal   SpO2:  97% 96% 95%  Weight: 57 kg     Height: 6\' 2"  (1.88 m)      *** Data Reviewed: {Tip this will not be part of the note when signed- Document your independent interpretation of telemetry tracing, EKG, lab, Radiology test or any other diagnostic tests. Add any new diagnostic  test ordered today. (Optional):26781} {Results:26384}  Assessment and Plan: No notes have been filed under this hospital service. Service: Hospitalist     Advance Care Planning:   Code Status: Prior ***  Consults: ***  Family Communication: ***  Severity of Illness: {Observation/Inpatient:21159}  Author: Frankey Shown, DO 10/25/2023 11:19 PM  For on call review [email or web address].

## 2023-10-25 NOTE — Discharge Instructions (Addendum)
He appears somewhat dehydrated.  Pause the Lasix for now to help rehydrate him.  Watch for signs of edema.

## 2023-10-25 NOTE — H&P (Signed)
History and Physical    Patient: [name] ZOX:[id number] DOB: 1932/12/04 DOA: 10/25/2023 DOS: the patient was seen and examined on 10/26/2023 PCP: Micael Hampshire, FNP  Patient coming from: SNF  Chief Complaint:  Chief Complaint  Patient presents with   Fever   HPI: [name] is a 89 y.o. male with medical history significant of  hypertension, hypothyroidism, lung cancer, chronic respiratory failure with hypoxia on supplemental oxygen at 2 LPM via [place] who presents to the emergency department from [facility] via EMS.  Patient was unable to provide any history possibly due to altered mental status, history was obtained from ED physician and ED medical record.  Per report, patient was found on the floor at the nursing facility without supplemental oxygen on.  Workup in the ED showed increase in creatinine from baseline, IV fluid was given and patient was discharged back to the facility. Patient returned in the evening due to concern of fever and delirium.  Apparently, patient has been delirious for about 2 days PTA per medical record, patient was reported to be able to maintain a conversation at baseline.  At baseline, he was nonambulatory (reason for placing him at [facility]).  He was reported to have a fever of 103.2 at the facility this evening.  Tylenol was given prior to being taken to the ED for further evaluation and management.  ED Course:  In the emergency department, BP was 159/83, O2 sat was 96% on 3 LPM.  Workup in the ED showed normocytic anemia.  BMP was normal except for potassium of 5.2, BUN 101/creatinine 3.09 (baseline creatinine at 2.5-2.7).  Troponin was normal, lactic acid was normal, magnesium 2.3 > 2.5.  Blood culture pending CT chest, abdomen and pelvis without contrast showed new airspace opacity in the right lower lobe posteriorly may be due to to atelectasis or pneumonia. New moderate right hydroureter and increased left hydroureteronephrosis  suspected to be due to cystitis and ascending urinary tract infection.  No ureteral stone.  Constipation with large stool ball in rectum CT head showed no acute intracranial process Chest x-ray showed parenchymal opacity in the right upper lobe is unchanged CT cervical spine without contrast showed no acute traumatic injury identified cervical spine Patient was treated with IV ceftriaxone due to presumed UTI.  IV ceftriaxone and doxycycline was given due to CAP.  Haldol was given in the ED. Hospitalist was asked to admit patient for further evaluation and management.  Review of Systems: Review of systems as noted in the HPI. All other systems reviewed and are negative.   Past Medical History:  Diagnosis Date   Arthritis    Hypertension    Hyperthyroidism    lung ca 11/2020   Past Surgical History:  Procedure Laterality Date   COLONOSCOPY N/A 09/02/2016   Procedure: COLONOSCOPY;  Surgeon: [name], MD;  Location: [facility];  Service: Endoscopy;  Laterality: N/A;  830   NO PAST SURGERIES     POLYPECTOMY  09/02/2016   Procedure: POLYPECTOMY;  Surgeon: [name], MD;  Location: [facility];  Service: Endoscopy;;    Social History:  reports that he quit smoking about 25 years ago. His smoking use included cigarettes. He started smoking about 65 years ago. He has a 60 pack-year smoking history. He has never used smokeless tobacco. He reports that he does not drink alcohol and does not use drugs.   No Known Allergies  Family History  Problem Relation Age of  Onset   Colon cancer Other      Prior to Admission medications   Medication Sig Start Date End Date Taking? Authorizing Provider  amLODipine (NORVASC) 10 MG tablet Take 1 tablet (10 mg total) by mouth daily. Hold for blood pressure reading <130 09/01/23   [name], NP  buPROPion ER [facility] SR) 100 MG 12 hr tablet Take 100 mg by mouth 2 (two) times daily.    [provider]  calcium carbonate  (OSCAL) 1500 (600 Ca) MG TABS tablet Take 500 mg by mouth daily with breakfast. For calcium supplement    [provider]  feeding supplement (ENSURE ENLIVE / ENSURE PLUS) LIQD Take 237 mLs by mouth 3 (three) times daily between meals. 10/11/23   [name], [name], DO  ferrous sulfate 325 (65 FE) MG EC tablet Take 1 tablet (325 mg total) by mouth 3 (three) times a week. 09/01/23   [name], NP  fluticasone-salmeterol (ADVAIR) 250-50 MCG/ACT AEPB Inhale 1 puff into the lungs in the morning and at bedtime. 09/01/23   [name], NP  furosemide (LASIX) 40 MG tablet Take 1 tablet (40 mg total) by mouth daily. 09/01/23   [name], NP  gabapentin (NEURONTIN) 250 MG/5ML solution Take 3 mLs (150 mg total) by mouth at bedtime. 09/01/23   [name], NP  labetalol (NORMODYNE) 200 MG tablet Take 1 tablet (200 mg total) by mouth 2 (two) times daily. Hold for blood pressure reading <130 09/01/23   [name], NP  levothyroxine (SYNTHROID) 150 MCG tablet Take 1 tablet (150 mcg total) by mouth daily. 09/01/23   [name], NP  Protein (PROSOURCE) PACK Take 1 each by mouth 3 (three) times daily. 09/01/23   [name], NP  tadalafil (CIALIS) 5 MG tablet Take 1 tablet (5 mg total) by mouth daily. 09/01/23   [name], NP  tamsulosin (FLOMAX) 0.4 MG CAPS capsule Take 1 capsule (0.4 mg total) by mouth daily. 09/01/23   [name], NP  Tiotropium Bromide Monohydrate (SPIRIVA RESPIMAT) 1.25 MCG/ACT AERS Inhale 2 puffs into the lungs daily at 2 PM. 09/01/23   Chilton Si, [name], NP  umeclidinium bromide (INCRUSE ELLIPTA) 62.5 MCG/ACT AEPB Inhale 1 puff into the lungs daily.    [provider]    Physical Exam: BP (!) 177/96   Pulse (!) 117   Temp 98.4 F (36.9 C) (Axillary)   Resp 18   Ht 6\' 2"  (1.88 m)   Wt 57 kg   SpO2 94%   BMI 16.13 kg/m   General: 89 y.o. year-old male ill appearing and in no acute distress.  Alert but oriented  HEENT: NCAT,  EOMI, dry mucous membrane Neck: Supple, trachea medial Cardiovascular: Regular rate and rhythm with no rubs or gallops.  No thyromegaly or JVD noted.  No lower extremity edema. 2/4 pulses in all 4 extremities. Respiratory: Clear to auscultation with no wheezes or rales. Good inspiratory effort. Abdomen: Soft, nontender nondistended with normal bowel sounds x4 quadrants. Muskuloskeletal: No cyanosis, clubbing or edema noted bilaterally Neuro: No focal neurologic deficit.  Sensation, reflexes intact Skin: No ulcerative lesions noted or rashes Psychiatry: Mood is appropriate for condition and setting          Labs on Admission:  Basic Metabolic Panel: Recent Labs  Lab 10/25/23 0759 10/25/23 2012 10/26/23 0518  NA 140 140 140  K 5.0 5.2* 5.4*  CL 106 108 109  CO2 25 23  20*  GLUCOSE 77 91 59*  BUN 100* 101* 97*  CREATININE 3.02* 3.09* 2.97*  CALCIUM 9.0 8.7* 8.5*  MG  --  2.3 2.5*  PHOS  --   --  3.6   Liver Function Tests: Recent Labs  Lab 10/25/23 0759 10/25/23 2012 10/26/23 0518  AST 20 22 25   ALT 25 25 24   ALKPHOS 73 66 66  BILITOT 0.7 0.8 0.9  PROT 7.8 7.4 7.2  ALBUMIN 3.3* 3.2* 3.1*   No results for input(s): "LIPASE", "AMYLASE" in the last 168 hours. No results for input(s): "AMMONIA" in the last 168 hours. CBC: Recent Labs  Lab 10/25/23 0759 10/25/23 2012 10/26/23 0518  WBC 7.2 7.5 7.4  NEUTROABS 6.1 5.8  --   HGB 9.9* 9.0* 10.2*  HCT 32.1* 28.4* 33.8*  MCV 93.0 91.9 98.5  PLT 235 233 181   Cardiac Enzymes: No results for input(s): "CKTOTAL", "CKMB", "CKMBINDEX", "TROPONINI" in the last 168 hours.  BNP (last 3 results) Recent Labs    07/07/23 1818  BNP 175.0*    ProBNP (last 3 results) No results for input(s): "PROBNP" in the last 8760 hours.  CBG: Recent Labs  Lab 10/26/23 0736 10/26/23 0842  GLUCAP 59* 71    Radiological Exams on Admission: CT CHEST ABDOMEN PELVIS WO CONTRAST Result Date: 10/25/2023 CLINICAL DATA:  Fever.   Delirious.  Sepsis. EXAM: CT CHEST, ABDOMEN AND PELVIS WITHOUT CONTRAST TECHNIQUE: Multidetector CT imaging of the chest, abdomen and pelvis was performed following the standard protocol without IV contrast. RADIATION DOSE REDUCTION: This exam was performed according to the departmental dose-optimization program which includes automated exposure control, adjustment of the mA and/or kV according to patient size and/or use of iterative reconstruction technique. COMPARISON:  Same day chest radiograph; PET/CT 08/31/2023; CT chest 07/07/2023; CT chest abdomen and pelvis 04/29/2023 FINDINGS: CT CHEST FINDINGS Cardiovascular: Small pericardial effusion. Advanced aortic atherosclerotic calcification. Normal heart size. Mediastinum/Nodes: No thoracic adenopathy noting limitations of noncontrast exam and respiratory motion artifact. Trachea and esophagus are unremarkable. Lungs/Pleura: Respiratory motion obscures detail. Emphysema with bullous change in the right apex. Unchanged right upper lobe volume loss, architectural distortion, and traction bronchiectasis, presumably radiation induced. New airspace opacities in the right lower lobe posteriorly may be due to atelectasis or pneumonia. No pleural effusion or pneumothorax. Musculoskeletal: No acute fracture. Unchanged absence of a portion of the third anterolateral right rib. CT ABDOMEN PELVIS FINDINGS Hepatobiliary: Unremarkable noncontrast appearance of the liver, gallbladder, and biliary tree. Pancreas: Unremarkable. Spleen: No acute abnormality. Adrenals/Urinary Tract: Stable adrenal glands. Since PET/CT 08/31/2023 there is new moderate hydroureter in the proximal ureter. The distal ureter is not well evaluated. Increased marked left hydroureteronephrosis. No ureteral stone. Diffuse bladder wall thickening. Stomach/Bowel: Stomach is within normal limits. Paucity of intra-abdominal fat, lack of IV contrast, and motion artifact limits assessment of the bowel wall. No  evidence of obstruction. Large colonic stool burden and stool ball in the rectum. Vascular/Lymphatic: Advanced aortic atherosclerotic calcification. No definite lymphadenopathy. Reproductive: Fiducial markers along the prostate. Other: No free intraperitoneal fluid or air. Musculoskeletal: No acute fracture. IMPRESSION: 1. New airspace opacities in the right lower lobe posteriorly may be due to atelectasis or pneumonia. 2. Since PET/CT 08/31/2023 there is new moderate right hydroureter and increased left hydroureteronephrosis. No ureteral stone. Diffuse bladder wall thickening. Findings may be due to cystitis and ascending urinary tract infection. 3. Constipation with large stool ball in the rectum. Correlate for fecal impaction. Aortic Atherosclerosis (ICD10-I70.0) and Emphysema (ICD10-J43.9). Electronically Signed  By: Minerva Fester M.D.   On: 10/25/2023 21:35   CT Head Wo Contrast Result Date: 10/25/2023 CLINICAL DATA:  Mental status change.  Fever. EXAM: CT HEAD WITHOUT CONTRAST TECHNIQUE: Contiguous axial images were obtained from the base of the skull through the vertex without intravenous contrast. RADIATION DOSE REDUCTION: This exam was performed according to the departmental dose-optimization program which includes automated exposure control, adjustment of the mA and/or kV according to patient size and/or use of iterative reconstruction technique. COMPARISON:  Head CT 10/25/2023.  MRI brain 01/02/2021. FINDINGS: Brain: No evidence of acute infarction, hemorrhage, hydrocephalus, extra-axial collection or mass lesion/mass effect. Again seen is mild diffuse atrophy and moderate periventricular white matter hypodensity, likely chronic small vessel ischemic change. Vascular: Atherosclerotic calcifications are present within the cavernous internal carotid arteries. Skull: Normal. Negative for fracture or focal lesion. Sinuses/Orbits: No acute finding. Other: None. IMPRESSION: 1. No acute intracranial  process. 2. Stable atrophy and chronic small vessel ischemic changes. Electronically Signed   By: Darliss Cheney M.D.   On: 10/25/2023 21:16   DG Chest Port 1 View Result Date: 10/25/2023 CLINICAL DATA:  Questionable sepsis EXAM: PORTABLE CHEST 1 VIEW COMPARISON:  Chest x-ray 10/25/2023.  CT of the chest 07/07/2023. FINDINGS: Parenchymal opacity in the right upper lobe is unchanged. Emphysema again noted. No new lung infiltrate or pleural effusion. No pneumothorax. The cardiomediastinal silhouette appears stable. IMPRESSION: 1. Parenchymal opacity in the right upper lobe is unchanged. 2. No new lung infiltrate or pleural effusion. Electronically Signed   By: Darliss Cheney M.D.   On: 10/25/2023 20:48   CT Cervical Spine Wo Contrast Result Date: 10/25/2023 CLINICAL DATA:  89 year old male status post fall from bed. Found down. EXAM: CT CERVICAL SPINE WITHOUT CONTRAST TECHNIQUE: Multidetector CT imaging of the cervical spine was performed without intravenous contrast. Multiplanar CT image reconstructions were also generated. RADIATION DOSE REDUCTION: This exam was performed according to the departmental dose-optimization program which includes automated exposure control, adjustment of the mA and/or kV according to patient size and/or use of iterative reconstruction technique. COMPARISON:  Head CT today.  No prior cervical spine CT. FINDINGS: Alignment: Maintained cervical lordosis. Cervicothoracic junction alignment is within normal limits. Bilateral posterior element alignment is within normal limits. Skull base and vertebrae: Mild motion artifact, less than on the head CT today. Visualized skull base is intact. No atlanto-occipital dissociation. C1 and C2 appear intact and aligned. Heterogeneous bone mineralization although stable compared to chest CT 07/07/2023. No acute osseous abnormality identified. Soft tissues and spinal canal: No prevertebral fluid or swelling. No visible canal hematoma. Calcified  cervical carotid atherosclerosis. Disc levels: Generally mild for age, age-appropriate cervical spine degeneration. Up to mild associated cervical spinal stenosis. Upper chest: Emphysema and architectural distortion in the lung apices. Visible upper thoracic levels appear intact. IMPRESSION: 1. No acute traumatic injury identified in the cervical spine. 2. Generally mild for age cervical spine degeneration. 3.  Emphysema (ICD10-J43.9). Electronically Signed   By: Odessa Fleming M.D.   On: 10/25/2023 08:55   CT Head Wo Contrast Result Date: 10/25/2023 CLINICAL DATA:  89 year old male status post fall from bed. Found down. EXAM: CT HEAD WITHOUT CONTRAST TECHNIQUE: Contiguous axial images were obtained from the base of the skull through the vertex without intravenous contrast. RADIATION DOSE REDUCTION: This exam was performed according to the departmental dose-optimization program which includes automated exposure control, adjustment of the mA and/or kV according to patient size and/or use of iterative reconstruction technique. COMPARISON:  Brain MRI  01/02/2021.  Head CT 10/07/2023. FINDINGS: Mildly motion degraded exam today. Brain: Stable cerebral volume. No midline shift, ventriculomegaly, mass effect, evidence of mass lesion, intracranial hemorrhage or evidence of cortically based acute infarction. Stable gray-white matter differentiation throughout the brain. Patchy bilateral periventricular is stable. Vascular: Calcified atherosclerosis at the skull base. No suspicious intracranial vascular hyperdensity. Skull: Stable, intact. Sinuses/Orbits: Visualized paranasal sinuses and mastoids are stable and well aerated. Other: Postoperative changes to both globes. No acute orbit or scalp soft tissue injury is identified. IMPRESSION: Mildly motion degraded exam with No acute intracranial abnormality or acute traumatic injury identified. Electronically Signed   By: Odessa Fleming M.D.   On: 10/25/2023 08:48   DG Chest Portable 1  View Result Date: 10/25/2023 CLINICAL DATA:  89 year old male with history of altered mental status. EXAM: PORTABLE CHEST 1 VIEW COMPARISON:  Chest x-ray 10/07/2023. FINDINGS: Chronic mass-like area of architectural distortion in the periphery of the right upper lobe with overlying of architectural distortion in the adjacent ribs, similar to the prior study, most compatible with an area of chronic postradiation mass-like fibrosis. Lungs otherwise appear clear. No pleural effusions. No pneumothorax. No evidence of pulmonary edema. Heart size is normal. Upper mediastinal contours are within normal limits. Atherosclerotic calcifications are noted in the thoracic aorta. IMPRESSION: 1. No radiographic evidence of acute cardiopulmonary disease. 2. Chronic postradiation changes in the right lung, similar to prior studies. Electronically Signed   By: Trudie Reed M.D.   On: 10/25/2023 08:05    EKG: I independently viewed the EKG done and my findings are as followed: Sinus tachycardia at rate of 122 bpm with RBBB and QTc of 520 ms  Assessment/Plan Present on Admission:  CAP (community acquired pneumonia)  Acute metabolic encephalopathy  Acquired hypothyroidism  Acute kidney injury superimposed on stage 4 chronic kidney disease (HCC)  Prolonged QT interval  Failure to thrive in adult  COPD (chronic obstructive pulmonary disease) (HCC)  Benign prostatic hyperplasia  Chronic diastolic CHF (congestive heart failure) (HCC)  Underweight  Principal Problem:   CAP (community acquired pneumonia) Active Problems:   COPD (chronic obstructive pulmonary disease) (HCC)   Chronic diastolic CHF (congestive heart failure) (HCC)   Acquired hypothyroidism   Underweight   Benign prostatic hyperplasia   Acute metabolic encephalopathy   Acute kidney injury superimposed on stage 4 chronic kidney disease (HCC)   Prolonged QT interval   Failure to thrive in adult   UTI (urinary tract infection)   Hyperkalemia    Hypermagnesemia  CAP POA Patient was started on ceftriaxone and doxycycline, we shall continue same at this time with plan to de-escalate/discontinue based on blood culture, sputum culture, urine Legionella, strep pneumo  Continue Tylenol as needed Continue Mucinex, incentive spirometry, flutter valve   Presumed UTI POA CT abdomen pelvis showed new moderate right hydroureter and increased left hydroureteronephrosis suspected to be due to cystitis and ascending urinary tract infection.  Urine culture on 10/07/2023 was positive for Proteus mirabilis which was sensitive to ceftriaxone Patient was started on IV ceftriaxone, we shall continue with same at this time Urine culture pending Patient may require urology consult as an outpatient   Acute metabolic encephalopathy possibly secondary to multifactorial This may be due to above 2 Continue fall precaution Continue management as described above Avoid CNS acting drugs  Acute kidney injury on CKD 4 Dehydration BUN 101/creatinine 3.09 (baseline creatinine at 2.5-2.7).   Continue gentle hydration Renally adjust medications, avoid nephrotoxic agents/dehydration/hypotension  Hyperkalemia K+ 5.2, Lokelma given Continue  to monitor potassium level Continue telemetry  Hypermagnesemia Mg 2.5, calcium gluconate was given Continue to monitor magnesium and treat for worsening levels Continue telemetry  Prolonged QT interval QTc 520 ms This may be due to hyperkalemia and hypermagnesemia Avoid QT prolonging drugs Magnesium was treated with calcium gluconate Repeat EKG in the afternoon  Failure to thrive in adult Underweight ( BMI 16.13) Dietitian will be consulted and we shall await further recommendation  COPD (not in acute exacerbation) Resume Spiriva when patient is able to cooperate (mental status currently altered)   BPH Resume Flomax when patient is able to cooperate (mental status currently altered)   Chronic diastolic  CHF Stable. Continue total input/output, daily weights and fluid restriction Continue heart healthy diet        Acquired hypothyroidism Resume Synthroid when patient is able to cooperate (mental status currently altered).   Essential hypertension Continue IV labetalol 5 mg every 6 hours as needed for SBP > 170 Resume home amlodipine and labetalol when patient is able to cooperate (mental status currently altered)   DVT prophylaxis: Heparin subcu  Code Status: DNR  Family Communication: None at bedside  Consults: Dietitian  Severity of Illness: The appropriate patient status for this patient is INPATIENT. Inpatient status is judged to be reasonable and necessary in order to provide the required intensity of service to ensure the patient's safety. The patient's presenting symptoms, physical exam findings, and initial radiographic and laboratory data in the context of their chronic comorbidities is felt to place them at high risk for further clinical deterioration. Furthermore, it is not anticipated that the patient will be medically stable for discharge from the hospital within 2 midnights of admission.   * I certify that at the point of admission it is my clinical judgment that the patient will require inpatient hospital care spanning beyond 2 midnights from the point of admission due to high intensity of service, high risk for further deterioration and high frequency of surveillance required.*  Author: Frankey Shown, DO 10/26/2023 9:14 AM  For on call review [email or web address].

## 2023-10-25 NOTE — ED Provider Notes (Signed)
[place] EMERGENCY DEPARTMENT AT [facility] Provider Note   CSN: [id number] Arrival date & time: 10/25/23  4098     History  Chief Complaint  Patient presents with   Altered Mental Status    [name] is a 89 y.o. male.   Altered Mental Status Patient reportedly brought in from [place] [facility].  Reportedly found on the floor with oxygen off.  Per EMS bed was reportedly 6 feet off the ground.  Reportedly had slurred speech.  Somewhat difficult history from patient.  States that he feels fine.    Past Medical History:  Diagnosis Date   Arthritis    Hypertension    Hyperthyroidism    lung ca 11/2020    Home Medications Prior to Admission medications   Medication Sig Start Date End Date Taking? Authorizing Provider  amLODipine (NORVASC) 10 MG tablet Take 1 tablet (10 mg total) by mouth daily. Hold for blood pressure reading <130 09/01/23   [name], NP  buPROPion ER [facility] SR) 100 MG 12 hr tablet Take 100 mg by mouth 2 (two) times daily.    [provider]  calcium carbonate (OSCAL) 1500 (600 Ca) MG TABS tablet Take 500 mg by mouth daily with breakfast. For calcium supplement    [provider]  feeding supplement (ENSURE ENLIVE / ENSURE PLUS) LIQD Take 237 mLs by mouth 3 (three) times daily between meals. 10/11/23   [name], [name], DO  ferrous sulfate 325 (65 FE) MG EC tablet Take 1 tablet (325 mg total) by mouth 3 (three) times a week. 09/01/23   [name], NP  fluticasone-salmeterol (ADVAIR) 250-50 MCG/ACT AEPB Inhale 1 puff into the lungs in the morning and at bedtime. 09/01/23   [name], NP  furosemide (LASIX) 40 MG tablet Take 1 tablet (40 mg total) by mouth daily. 09/01/23   [name], NP  gabapentin (NEURONTIN) 250 MG/5ML solution Take 3 mLs (150 mg total) by mouth at bedtime. 09/01/23   [name], NP  labetalol (NORMODYNE) 200 MG tablet Take 1 tablet (200 mg total) by mouth 2 (two) times  daily. Hold for blood pressure reading <130 09/01/23   [name], NP  levothyroxine (SYNTHROID) 150 MCG tablet Take 1 tablet (150 mcg total) by mouth daily. 09/01/23   [name], NP  Protein (PROSOURCE) PACK Take 1 each by mouth 3 (three) times daily. 09/01/23   [name], NP  tadalafil (CIALIS) 5 MG tablet Take 1 tablet (5 mg total) by mouth daily. 09/01/23   [name], NP  tamsulosin (FLOMAX) 0.4 MG CAPS capsule Take 1 capsule (0.4 mg total) by mouth daily. 09/01/23   [name], NP  Tiotropium Bromide Monohydrate (SPIRIVA RESPIMAT) 1.25 MCG/ACT AERS Inhale 2 puffs into the lungs daily at 2 PM. 09/01/23   Chilton Si, [name], NP  umeclidinium bromide (INCRUSE ELLIPTA) 62.5 MCG/ACT AEPB Inhale 1 puff into the lungs daily.    [provider]      Allergies    Patient has no known allergies.    Review of Systems   Review of Systems  Physical Exam Updated Vital Signs BP (!) 150/76   Pulse 97   Temp 98 F (36.7 C) (Oral)   Resp 14   SpO2 96%  Physical Exam Vitals reviewed.  HENT:     Head: Normocephalic.  Eyes:     General: No scleral icterus. Cardiovascular:     Rate  and Rhythm: Regular rhythm.  Pulmonary:     Breath sounds: No wheezing or rhonchi.  Abdominal:     Tenderness: There is no abdominal tenderness.  Musculoskeletal:        General: No tenderness.     Cervical back: Neck supple. No tenderness.  Skin:    Coloration: Skin is not jaundiced.     Comments: Patient with vitiligo  Neurological:     Mental Status: He is alert.     Comments: Awake and pleasant but may have some confusion.     ED Results / Procedures / Treatments   Labs (all labs ordered are listed, but only abnormal results are displayed) Labs Reviewed  URINALYSIS, W/ REFLEX TO CULTURE (INFECTION SUSPECTED) - Abnormal; Notable for the following components:      Result Value   Color, Urine STRAW (*)    Leukocytes,Ua MODERATE (*)    All other components within  normal limits  COMPREHENSIVE METABOLIC PANEL - Abnormal; Notable for the following components:   BUN 100 (*)    Creatinine, Ser 3.02 (*)    Albumin 3.3 (*)    GFR, Estimated 19 (*)    All other components within normal limits  CBC WITH DIFFERENTIAL/PLATELET - Abnormal; Notable for the following components:   RBC 3.45 (*)    Hemoglobin 9.9 (*)    HCT 32.1 (*)    RDW 18.6 (*)    All other components within normal limits  URINE CULTURE    EKG None  Radiology CT Cervical Spine Wo Contrast Result Date: 10/25/2023 CLINICAL DATA:  89 year old male status post fall from bed. Found down. EXAM: CT CERVICAL SPINE WITHOUT CONTRAST TECHNIQUE: Multidetector CT imaging of the cervical spine was performed without intravenous contrast. Multiplanar CT image reconstructions were also generated. RADIATION DOSE REDUCTION: This exam was performed according to the departmental dose-optimization program which includes automated exposure control, adjustment of the mA and/or kV according to patient size and/or use of iterative reconstruction technique. COMPARISON:  Head CT today.  No prior cervical spine CT. FINDINGS: Alignment: Maintained cervical lordosis. Cervicothoracic junction alignment is within normal limits. Bilateral posterior element alignment is within normal limits. Skull base and vertebrae: Mild motion artifact, less than on the head CT today. Visualized skull base is intact. No atlanto-occipital dissociation. C1 and C2 appear intact and aligned. Heterogeneous bone mineralization although stable compared to chest CT 07/07/2023. No acute osseous abnormality identified. Soft tissues and spinal canal: No prevertebral fluid or swelling. No visible canal hematoma. Calcified cervical carotid atherosclerosis. Disc levels: Generally mild for age, age-appropriate cervical spine degeneration. Up to mild associated cervical spinal stenosis. Upper chest: Emphysema and architectural distortion in the lung apices.  Visible upper thoracic levels appear intact. IMPRESSION: 1. No acute traumatic injury identified in the cervical spine. 2. Generally mild for age cervical spine degeneration. 3.  Emphysema (ICD10-J43.9). Electronically Signed   By: Odessa Fleming M.D.   On: 10/25/2023 08:55   CT Head Wo Contrast Result Date: 10/25/2023 CLINICAL DATA:  89 year old male status post fall from bed. Found down. EXAM: CT HEAD WITHOUT CONTRAST TECHNIQUE: Contiguous axial images were obtained from the base of the skull through the vertex without intravenous contrast. RADIATION DOSE REDUCTION: This exam was performed according to the departmental dose-optimization program which includes automated exposure control, adjustment of the mA and/or kV according to patient size and/or use of iterative reconstruction technique. COMPARISON:  Brain MRI 01/02/2021.  Head CT 10/07/2023. FINDINGS: Mildly motion degraded exam today. Brain: Stable cerebral  volume. No midline shift, ventriculomegaly, mass effect, evidence of mass lesion, intracranial hemorrhage or evidence of cortically based acute infarction. Stable gray-white matter differentiation throughout the brain. Patchy bilateral periventricular is stable. Vascular: Calcified atherosclerosis at the skull base. No suspicious intracranial vascular hyperdensity. Skull: Stable, intact. Sinuses/Orbits: Visualized paranasal sinuses and mastoids are stable and well aerated. Other: Postoperative changes to both globes. No acute orbit or scalp soft tissue injury is identified. IMPRESSION: Mildly motion degraded exam with No acute intracranial abnormality or acute traumatic injury identified. Electronically Signed   By: Odessa Fleming M.D.   On: 10/25/2023 08:48   DG Chest Portable 1 View Result Date: 10/25/2023 CLINICAL DATA:  89 year old male with history of altered mental status. EXAM: PORTABLE CHEST 1 VIEW COMPARISON:  Chest x-ray 10/07/2023. FINDINGS: Chronic mass-like area of architectural distortion in the  periphery of the right upper lobe with overlying of architectural distortion in the adjacent ribs, similar to the prior study, most compatible with an area of chronic postradiation mass-like fibrosis. Lungs otherwise appear clear. No pleural effusions. No pneumothorax. No evidence of pulmonary edema. Heart size is normal. Upper mediastinal contours are within normal limits. Atherosclerotic calcifications are noted in the thoracic aorta. IMPRESSION: 1. No radiographic evidence of acute cardiopulmonary disease. 2. Chronic postradiation changes in the right lung, similar to prior studies. Electronically Signed   By: Trudie Reed M.D.   On: 10/25/2023 08:05    Procedures Procedures    Medications Ordered in ED Medications  sodium chloride 0.9 % bolus 500 mL (500 mLs Intravenous New Bag/Given 10/25/23 1308)    ED Course/ Medical Decision Making/ A&P                                 Medical Decision Making Amount and/or Complexity of Data Reviewed Labs: ordered. Radiology: ordered.   Patient with reported fall out of bed and off oxygen.  However the report states that the bed was 6 feet at the ear which I think is somewhat unlikely.  Will get head CT and cervical spine CT.  Will get basic blood work.  Reportedly has had some urinary symptoms recently.  Urine does not show infection.  However creatinine is increased as is the BUN.  Likely some recurrent dehydration.  Fluid bolus given.  Will pause Lasix for now.  Short-term follow-up with PCP.  Discussed with patient's family member.        Final Clinical Impression(s) / ED Diagnoses Final diagnoses:  Dehydration    Rx / DC Orders ED Discharge Orders     None         Benjiman Core, MD 10/25/23 1004

## 2023-10-25 NOTE — ED Notes (Signed)
Pt has been restless and scooting out of bed since he got here. EDP notified family requesting something to help pt rest.

## 2023-10-25 NOTE — ED Triage Notes (Signed)
Per facility pt developed fever today and has been delirious (reaching for things not there). They state he got 650mg  of tylenol at 1830 and was also started on ativan today for being altered.

## 2023-10-26 DIAGNOSIS — G9341 Metabolic encephalopathy: Secondary | ICD-10-CM | POA: Diagnosis not present

## 2023-10-26 DIAGNOSIS — R627 Adult failure to thrive: Secondary | ICD-10-CM | POA: Diagnosis not present

## 2023-10-26 DIAGNOSIS — J189 Pneumonia, unspecified organism: Secondary | ICD-10-CM | POA: Diagnosis not present

## 2023-10-26 DIAGNOSIS — N390 Urinary tract infection, site not specified: Secondary | ICD-10-CM | POA: Insufficient documentation

## 2023-10-26 DIAGNOSIS — E875 Hyperkalemia: Secondary | ICD-10-CM | POA: Insufficient documentation

## 2023-10-26 LAB — CBC
HCT: 33.8 % — ABNORMAL LOW (ref 39.0–52.0)
Hemoglobin: 10.2 g/dL — ABNORMAL LOW (ref 13.0–17.0)
MCH: 29.7 pg (ref 26.0–34.0)
MCHC: 30.2 g/dL (ref 30.0–36.0)
MCV: 98.5 fL (ref 80.0–100.0)
Platelets: 181 10*3/uL (ref 150–400)
RBC: 3.43 MIL/uL — ABNORMAL LOW (ref 4.22–5.81)
RDW: 19.7 % — ABNORMAL HIGH (ref 11.5–15.5)
WBC: 7.4 10*3/uL (ref 4.0–10.5)
nRBC: 0 % (ref 0.0–0.2)

## 2023-10-26 LAB — COMPREHENSIVE METABOLIC PANEL
ALT: 24 U/L (ref 0–44)
AST: 25 U/L (ref 15–41)
Albumin: 3.1 g/dL — ABNORMAL LOW (ref 3.5–5.0)
Alkaline Phosphatase: 66 U/L (ref 38–126)
Anion gap: 11 (ref 5–15)
BUN: 97 mg/dL — ABNORMAL HIGH (ref 8–23)
CO2: 20 mmol/L — ABNORMAL LOW (ref 22–32)
Calcium: 8.5 mg/dL — ABNORMAL LOW (ref 8.9–10.3)
Chloride: 109 mmol/L (ref 98–111)
Creatinine, Ser: 2.97 mg/dL — ABNORMAL HIGH (ref 0.61–1.24)
GFR, Estimated: 20 mL/min — ABNORMAL LOW (ref >60)
Glucose, Bld: 59 mg/dL — ABNORMAL LOW (ref 70–99)
Potassium: 5.4 mmol/L — ABNORMAL HIGH (ref 3.5–5.1)
Sodium: 140 mmol/L (ref 135–145)
Total Bilirubin: 0.9 mg/dL (ref 0.0–1.2)
Total Protein: 7.2 g/dL (ref 6.5–8.1)

## 2023-10-26 LAB — GLUCOSE, CAPILLARY
Glucose-Capillary: 71 mg/dL (ref 70–99)
Glucose-Capillary: 68 mg/dL — ABNORMAL LOW (ref 70–99)
Glucose-Capillary: 88 mg/dL (ref 70–99)
Glucose-Capillary: 97 mg/dL (ref 70–99)

## 2023-10-26 LAB — BASIC METABOLIC PANEL
Anion gap: 10 (ref 5–15)
BUN: 100 mg/dL — ABNORMAL HIGH (ref 8–23)
CO2: 22 mmol/L (ref 22–32)
Calcium: 8.9 mg/dL (ref 8.9–10.3)
Chloride: 110 mmol/L (ref 98–111)
Creatinine, Ser: 3.33 mg/dL — ABNORMAL HIGH (ref 0.61–1.24)
GFR, Estimated: 17 mL/min — ABNORMAL LOW (ref >60)
Glucose, Bld: 106 mg/dL — ABNORMAL HIGH (ref 70–99)
Potassium: 4.9 mmol/L (ref 3.5–5.1)
Sodium: 142 mmol/L (ref 135–145)

## 2023-10-26 LAB — MAGNESIUM: Magnesium: 2.5 mg/dL — ABNORMAL HIGH (ref 1.7–2.4)

## 2023-10-26 LAB — STREP PNEUMONIAE URINARY ANTIGEN: Strep Pneumo Urinary Antigen: NEGATIVE

## 2023-10-26 LAB — URINE CULTURE

## 2023-10-26 LAB — PHOSPHORUS: Phosphorus: 3.6 mg/dL (ref 2.5–4.6)

## 2023-10-26 LAB — TROPONIN I (HIGH SENSITIVITY): Troponin I (High Sensitivity): 14 ng/L (ref <18)

## 2023-10-26 LAB — CBG MONITORING, ED: Glucose-Capillary: 59 mg/dL — ABNORMAL LOW (ref 70–99)

## 2023-10-26 MED ORDER — MELATONIN 3 MG PO TABS
3.0000 mg | ORAL_TABLET | Freq: Every day | ORAL | Status: DC
  Administered 2023-10-26 – 2023-10-28 (×3): 3 mg via ORAL
  Filled 2023-10-26 (×3): qty 1

## 2023-10-26 MED ORDER — SODIUM CHLORIDE 0.9 % IV SOLN
100.0000 mg | Freq: Two times a day (BID) | INTRAVENOUS | Status: DC
  Administered 2023-10-26 – 2023-10-28 (×3): 100 mg via INTRAVENOUS
  Filled 2023-10-26 (×6): qty 100

## 2023-10-26 MED ORDER — TAMSULOSIN HCL 0.4 MG PO CAPS
0.4000 mg | ORAL_CAPSULE | Freq: Every day | ORAL | Status: DC
  Administered 2023-10-26 – 2023-10-29 (×4): 0.4 mg via ORAL
  Filled 2023-10-26 (×4): qty 1

## 2023-10-26 MED ORDER — SODIUM CHLORIDE 0.9 % IV SOLN
1.0000 g | INTRAVENOUS | Status: DC
  Administered 2023-10-26 – 2023-10-28 (×3): 1 g via INTRAVENOUS
  Filled 2023-10-26 (×3): qty 10

## 2023-10-26 MED ORDER — DEXTROSE 50 % IV SOLN
INTRAVENOUS | Status: AC
  Administered 2023-10-26: 50 mL via INTRAVENOUS
  Filled 2023-10-26: qty 50

## 2023-10-26 MED ORDER — CALCIUM GLUCONATE-NACL 1-0.675 GM/50ML-% IV SOLN
1.0000 g | Freq: Once | INTRAVENOUS | Status: AC
  Administered 2023-10-26: 1000 mg via INTRAVENOUS
  Filled 2023-10-26: qty 50

## 2023-10-26 MED ORDER — DM-GUAIFENESIN ER 30-600 MG PO TB12
1.0000 | ORAL_TABLET | Freq: Two times a day (BID) | ORAL | Status: DC
  Administered 2023-10-26 – 2023-10-29 (×7): 1 via ORAL
  Filled 2023-10-26 (×7): qty 1

## 2023-10-26 MED ORDER — PROCHLORPERAZINE EDISYLATE 10 MG/2ML IJ SOLN: 10.0000 mg | Freq: Four times a day (QID) | INTRAMUSCULAR | Status: DC | PRN

## 2023-10-26 MED ORDER — UMECLIDINIUM BROMIDE 62.5 MCG/ACT IN AEPB
1.0000 | INHALATION_SPRAY | Freq: Every day | RESPIRATORY_TRACT | Status: DC
  Administered 2023-10-27 – 2023-10-29 (×3): 1 via RESPIRATORY_TRACT
  Filled 2023-10-26: qty 7

## 2023-10-26 MED ORDER — HEPARIN SODIUM (PORCINE) 5000 UNIT/ML IJ SOLN
5000.0000 [IU] | Freq: Three times a day (TID) | INTRAMUSCULAR | Status: DC
  Administered 2023-10-26 – 2023-10-29 (×11): 5000 [IU] via SUBCUTANEOUS
  Filled 2023-10-26 (×11): qty 1

## 2023-10-26 MED ORDER — SODIUM CHLORIDE 0.9 % IV SOLN: INTRAVENOUS | Status: DC

## 2023-10-26 MED ORDER — ACETAMINOPHEN 650 MG RE SUPP
650.0000 mg | Freq: Four times a day (QID) | RECTAL | Status: DC | PRN
Start: 2023-10-26 — End: 2023-10-29

## 2023-10-26 MED ORDER — LEVOTHYROXINE SODIUM 75 MCG PO TABS
150.0000 ug | ORAL_TABLET | Freq: Every day | ORAL | Status: DC
  Administered 2023-10-27 – 2023-10-29 (×3): 150 ug via ORAL
  Filled 2023-10-26 (×3): qty 2

## 2023-10-26 MED ORDER — DEXTROSE 50 % IV SOLN: 1.0000 | Freq: Once | INTRAVENOUS | Status: AC

## 2023-10-26 MED ORDER — HYDRALAZINE HCL 20 MG/ML IJ SOLN
10.0000 mg | Freq: Four times a day (QID) | INTRAMUSCULAR | Status: DC | PRN
  Administered 2023-10-26: 10 mg via INTRAVENOUS
  Filled 2023-10-26: qty 1

## 2023-10-26 MED ORDER — TIOTROPIUM BROMIDE MONOHYDRATE 1.25 MCG/ACT IN AERS: 2.0000 | INHALATION_SPRAY | Freq: Every day | RESPIRATORY_TRACT | Status: DC

## 2023-10-26 MED ORDER — SODIUM ZIRCONIUM CYCLOSILICATE 10 G PO PACK
10.0000 g | PACK | Freq: Once | ORAL | Status: AC
  Administered 2023-10-26: 10 g via ORAL
  Filled 2023-10-26: qty 1

## 2023-10-26 MED ORDER — LABETALOL HCL 5 MG/ML IV SOLN: 10.0000 mg | Freq: Four times a day (QID) | INTRAVENOUS | Status: DC | PRN

## 2023-10-26 MED ORDER — FLUTICASONE FUROATE-VILANTEROL 200-25 MCG/ACT IN AEPB
1.0000 | INHALATION_SPRAY | Freq: Every day | RESPIRATORY_TRACT | Status: DC
  Administered 2023-10-27 – 2023-10-29 (×3): 1 via RESPIRATORY_TRACT
  Filled 2023-10-26: qty 28

## 2023-10-26 MED ORDER — BUPROPION HCL ER (SR) 100 MG PO TB12
100.0000 mg | ORAL_TABLET | Freq: Two times a day (BID) | ORAL | Status: DC
  Administered 2023-10-26 – 2023-10-29 (×6): 100 mg via ORAL
  Filled 2023-10-26 (×6): qty 1

## 2023-10-26 MED ORDER — LABETALOL HCL 5 MG/ML IV SOLN
5.0000 mg | Freq: Four times a day (QID) | INTRAVENOUS | Status: DC | PRN
  Administered 2023-10-26: 5 mg via INTRAVENOUS
  Filled 2023-10-26: qty 4

## 2023-10-26 MED ORDER — AMLODIPINE BESYLATE 5 MG PO TABS
10.0000 mg | ORAL_TABLET | Freq: Every day | ORAL | Status: DC
Start: 2023-10-26 — End: 2023-10-29
  Administered 2023-10-26 – 2023-10-29 (×4): 10 mg via ORAL
  Filled 2023-10-26 (×4): qty 2

## 2023-10-26 MED ORDER — ACETAMINOPHEN 325 MG PO TABS: 650.0000 mg | ORAL_TABLET | Freq: Four times a day (QID) | ORAL | Status: DC | PRN

## 2023-10-26 MED ORDER — DEXTROSE-SODIUM CHLORIDE 5-0.45 % IV SOLN: INTRAVENOUS | Status: AC

## 2023-10-26 MED ORDER — HALOPERIDOL LACTATE 5 MG/ML IJ SOLN
1.0000 mg | Freq: Four times a day (QID) | INTRAMUSCULAR | Status: DC | PRN
  Administered 2023-10-26 – 2023-10-28 (×2): 1 mg via INTRAVENOUS
  Filled 2023-10-26 (×2): qty 1

## 2023-10-26 NOTE — TOC Initial Note (Signed)
Transition of Care [facility]) - Initial/Assessment Note    Patient Details  Name: [name] MRN: [id number] Date of Birth: 1933-08-01  Transition of Care [facility]) CM/SW Contact:    Elliot Gault, LCSW Phone Number: 10/26/2023, 11:47 AM  Clinical Narrative:                  Pt admitted from [facility] rehab. Pt was recently [facility] from [name]. Kerri at [facility] states pt can return at dc if he receives new SNF auth from insurance. PT/OT pending.  Pt currently not oriented. Met with daughters at bedside. They state that their preference is for pt to return to [facility] at dc.  TOC will follow and continue to assist with dc planning.  Expected Discharge Plan: Skilled Nursing Facility Barriers to Discharge: Continued Medical Work up   Patient Goals and CMS Choice Patient states their goals for this hospitalization and ongoing recovery are:: rehab CMS Medicare.gov Compare Post Acute Care list provided to:: Patient Represenative (must comment) Choice offered to / list presented to : Adult Children [place] ownership interest in [facility].provided to:: Adult Children    Expected Discharge Plan and Services In-house Referral: Clinical Social Work   Post Acute Care Choice: Skilled Nursing Facility Living arrangements for the past 2 months: Single Family Home                                      Prior Living Arrangements/Services Living arrangements for the past 2 months: Single Family Home Lives with:: Relatives Patient language and need for interpreter reviewed:: Yes Do you feel safe going back to the place where you live?: Yes      Need for Family Participation in Patient Care: Yes (Comment) Care giver support system in place?: Yes (comment)   Criminal Activity/Legal Involvement Pertinent to Current Situation/Hospitalization: No - Comment as needed  Activities of Daily Living      Permission  Sought/Granted Permission sought to share information with : Facility Industrial/product designer granted to share information with : Yes, Verbal Permission Granted     Permission granted to share info w AGENCY: [facility]        Emotional Assessment Appearance:: Appears stated age Attitude/Demeanor/Rapport: Unable to Assess Affect (typically observed): Unable to Assess Orientation: : Oriented to Self Alcohol / Substance Use: Not Applicable Psych Involvement: No (comment)  Admission diagnosis:  Delirium [R41.0] Azotemia [R79.89] CAP (community acquired pneumonia) [J18.9] Pneumonia of right lower lobe due to infectious organism [J18.9] Patient Active Problem List   Diagnosis Date Noted   UTI (urinary tract infection) 10/26/2023   Hyperkalemia 10/26/2023   Hypermagnesemia 10/26/2023   CAP (community acquired pneumonia) 10/25/2023   Acute metabolic encephalopathy 10/07/2023   Iron deficiency anemia 10/07/2023   Acute kidney injury superimposed on stage 4 chronic kidney disease (HCC) 10/07/2023   Dehydration 10/07/2023   Prolonged QT interval 10/07/2023   Generalized weakness 10/07/2023   Failure to thrive in adult 10/07/2023   Hypoalbuminemia due to protein-calorie malnutrition (HCC) 10/07/2023   Chronic respiratory failure with hypoxia (HCC) 10/07/2023   HCAP (healthcare-associated pneumonia) 08/24/2023   Fecal occult blood test positive 08/20/2023   Normocytic anemia 08/20/2023   Hypotension 08/20/2023   Weight loss 08/09/2023   Situational depression 08/09/2023   Aortic atherosclerosis (HCC) 07/30/2023   Hypocalcemia 07/26/2023   Benign hypertension with coincident congestive heart  failure (HCC) 07/22/2023   Benign hypertension with chronic kidney disease, stage IV (HCC) 07/22/2023   Severe protein-calorie malnutrition (HCC) 07/22/2023   Chronic diastolic CHF (congestive heart failure) (HCC) 07/10/2023   Thyromegaly 07/08/2023   Acute respiratory failure with  hypoxia (HCC) 07/08/2023   Peripheral edema 07/08/2023   Hypertensive crisis 07/08/2023   COPD (chronic obstructive pulmonary disease) (HCC) 07/07/2023   Penile discharge 05/06/2023   Vitiligo 12/17/2021   Chronic kidney disease, stage 4 (severe) (HCC) 07/18/2021   Essential hypertension 04/28/2021   Acquired hypothyroidism 04/28/2021   Underweight 04/28/2021   Chronic pain syndrome 04/28/2021   Carcinoma of prostate (HCC) 04/28/2021   Benign prostatic hyperplasia 04/28/2021   Osteoarthritis of knees, bilateral 04/28/2021   Dysphagia 04/10/2021   Anemia of chronic disease 02/10/2021   Cancer associated pain 12/23/2020   Primary cancer of right upper lobe of lung (HCC) 11/07/2020   PCP:  Micael Hampshire, FNP Pharmacy:   [facility] - [place], [place] - [address] [place] [address] Phone: [phone] Fax: [phone]     Social Drivers of Health (SDOH) Social History: SDOH Screenings   Food Insecurity: No Food Insecurity (10/07/2023)  Housing: Low Risk  (10/07/2023)  Transportation Needs: No Transportation Needs (10/07/2023)  Utilities: Not At Risk (10/07/2023)  Depression (PHQ2-9): Low Risk  (02/23/2023)  Social Connections: Unknown (10/08/2023)  Tobacco Use: Medium Risk (10/25/2023)   SDOH Interventions:     Readmission Risk Interventions    10/26/2023   11:45 AM 10/08/2023   12:45 PM  Readmission Risk Prevention Plan  Transportation Screening Complete Complete  Medication Review (RN Care Manager)  Complete  PCP or Specialist appointment within 3-5 days of discharge Complete   HRI or Home Care Consult Complete Complete  SW Recovery Care/Counseling Consult Complete Complete  Palliative Care Screening Not Applicable Not Applicable  Skilled Nursing Facility Complete Complete

## 2023-10-26 NOTE — Progress Notes (Addendum)
PROGRESS NOTE    [name]  GNF:[id number] DOB: [birth year] DOA: 10/25/2023 PCP: Micael Hampshire, FNP    Brief Narrative:   [name] is a 89 y.o. male with past medical history significant for HTN, hypothyroidism, Stage IV NSCLC, chronic hypoxic respiratory failure on 2 L nasal cannula at baseline, BPH who presented to [facility] ED on 10/25/2023 from [facility] via EMS for confusion after patient was found on the ground out of the bed with his oxygen off.  Patient unable to contribute to HPI given his confusion, most obtained from ED physician and medical record.  Apparently patient has been delirious for roughly 2 days, at baseline able to maintain a conversation.  He is nonambulatory at baseline but reported to have a fever of 103.2 F at the facility evening prior to transfer.  Tylenol was given and patient was transported to [name], ED for further evaluation and management per EMS.  In the ED, temperature 98.3 F, HR 104, RR 22, BP 152/84, SpO2 97% on 3 L nasal cannula.  WBC 7.2, hemoglobin 9.9, platelet count 235.  Sodium 140, potassium 5.0, chloride 106, CO2 25, glucose 77, BUN 100, creatinine 3.02.  AST 20, ALT 25, total bilirubin 0.7.  High-sensitivity troponin 15>14.  Urinalysis with moderate leukocytes, negative nitrite, 21-50 WBCs.  CT head without contrast with mildly motion degraded with no acute intracranial malady.  CT C-spine with no acute traumatic injury identified in the C-spine.  CT chest/abdomen/pelvis with new airspace opacities right lower lobe consistent with pneumonia, moderate right hydroureter and increased left hydroureteronephrosis, no ureteral stone, diffuse bladder wall thickening, constipation with large stool ball in the rectum.  Patient was given IV ceftriaxone, doxycycline for concern of UTI versus pneumonia.  Patient was given Haldol.  TRH consulted for admission for further evaluation management of acute metabolic encephalopathy likely  secondary to infectious process with UTI versus pneumonia.  Assessment & Plan:   Acute metabolic encephalopathy Patient presenting to the ED with confusion per records from SNF, typically able to carry on a conversation at baseline.  Was noted to be confused over the last 2 days with fever reported 103.2 F at the facility.  CT head without contrast with no acute findings.  Urinalysis consistent with UTI and chest CT notable for airspace opacities consistent with pneumonia; also with uremia from AKI likely contributing factor.  Also received Haldol in the ED; likely further contributing factor. -- Supportive care, treatment as below  Community-acquired pneumonia Patient with fever 103.2 F at the facility prior to arrival.  CT chest with new airspace opacities right lower lobe consistent with pneumonia. -- Blood cultures x 2: No growth less than 12 hours -- Sputum culture: Ordered -- Urine Legionella/strep pneumo antigen: Pending/ordered -- Doxycycline 100 mg IV every 12 hours -- Ceftriaxone 1 g IV every 24 hours -- Incentive spirometry/flutter valve -- Continue supplemental oxygen maintain SpO2 greater than 92%, on 2 L nasal cannula which is baseline  Urinary tract infection Urinalysis with moderate leukocytes, negative nitrite, 21-50 WBCs. -- Urine culture: Multiple species present -- Continue ceftriaxone as above  Acute renal failure on CKD stage IV BUN elevated 101 with creatinine 3.09 on admission (baseline creatinine at 2.5-2.7)  -- Cr 3.09>2.97 -- Continue IVF w/ D% 1/2 NS at 75 mL/h  Hyperkalemia Potassium 5.4 admission, likely secondary to acute renal failure as above. -- Lokelma 10 g p.o. x 1 -- Continue IV fluid hydration -- Repeat BMP in a.m.  Dysphagia --  SLP eval: Pending -- Dysphagia 1 diet, nectar thick liquids -- Aspiration precautions  Essential hypertension -- Amlodipine 10 mg p.o. daily -- Labetalol/hydralazine PRN SBP >165  Hypothyroidism --  Levothyroxine 150 mcg p.o. daily  Chronic hypoxic respiratory failure COPD, not in acute exacerbation -- Breo Ellipta 1 puff daily (substituted for home Advair) -- Continue Spiriva -- Albuterol neb every 4 hours as needed wheezing/shortness of breath -- Continue supplemental oxygen, maintain SpO2 greater than 92%, on 2 L which is at baseline  BPH --Tamsulosin 0.4 mg p.o. daily  Chronic diastolic congestive heart failure, compensated -- Hold home furosemide for now -- Strict I's and O's and daily weights  Stage IV non small cell lung cancer Follows with medical oncology outpatient, Dr. Shirline Frees.  S/p chemo radiation followed by immunotherapy.  Currently on observation.  Dementia --Delirium precautions --Get up during the day --Encourage a familiar face to remain present throughout the day --Keep blinds open and lights on during daylight hours --Minimize the use of opioids/benzodiazepines  Adult failure to thrive Generalized weakness/debility/deconditioning/gait disturbance: Seen by palliative care during most recent hospitalization, DNR.  At baseline nonambulatory.  From [facility] SNF.  If patient does not improve considerably back to his baseline with treatment as above, may need to consider transitioning to a more comfort/palliative approach. -- TOC consulted   DVT prophylaxis: heparin injection 5,000 Units Start: 10/26/23 0600 SCDs Start: 10/26/23 0453    Code Status: Limited: Do not attempt resuscitation (DNR) -DNR-LIMITED -Do Not Intubate/DNI  Family Communication:   Disposition Plan:  Level of care: Med-Surg Status is: Inpatient Remains inpatient appropriate because: IV antibiotics    Consultants:  None  Procedures:  None  Antimicrobials:  Doxycycline 1/27>> Ceftriaxone 1/27>>   Subjective: Patient seen examined bedside, lying in bed.  Daughter is present at bedside.  Remains confused, responds slowly to questions.  Objective: Vitals:   10/26/23 0715  10/26/23 0846 10/26/23 1030 10/26/23 1423  BP: (!) 172/91 (!) 177/96 (!) 174/99 136/77  Pulse: (!) 118 (!) 117 96 89  Resp: 20 18  18   Temp:  98.4 F (36.9 C) 98.8 F (37.1 C) 98.6 F (37 C)  TempSrc:  Axillary Axillary Axillary  SpO2: 94% 94% 98% 94%  Weight:      Height:        Intake/Output Summary (Last 24 hours) at 10/26/2023 1620 Last data filed at 10/26/2023 1500 Gross per 24 hour  Intake 924.39 ml  Output 150 ml  Net 774.39 ml   Filed Weights   10/25/23 1937  Weight: 57 kg    Examination:  Physical Exam: GEN: NAD, alert, oriented to person (self/daughters), time (2025), but not place (office), or situation HEENT: NCAT, PERRL, EOMI, sclera clear, dry mucous membranes PULM: Breath sounds slight diminished bilateral bases, no wheezes/crackles, normal respiratory effort without accessory muscle use, on 2 L nasal cannula which is at his baseline. CV: RRR w/o M/G/R GI: abd soft, NTND, + BS MSK: no peripheral edema, moves all extremities independently NEURO: No focal neurological deficits Integumentary: Noted vitiligo, otherwise no other concerning rashes/lesions/wounds noted on exposed skin surfaces    Data Reviewed: I have personally reviewed following labs and imaging studies  CBC: Recent Labs  Lab 10/25/23 0759 10/25/23 2012 10/26/23 0518  WBC 7.2 7.5 7.4  NEUTROABS 6.1 5.8  --   HGB 9.9* 9.0* 10.2*  HCT 32.1* 28.4* 33.8*  MCV 93.0 91.9 98.5  PLT 235 233 181   Basic Metabolic Panel: Recent Labs  Lab 10/25/23  1478 10/25/23 2012 10/26/23 0518 10/26/23 1249  NA 140 140 140 142  K 5.0 5.2* 5.4* 4.9  CL 106 108 109 110  CO2 25 23 20* 22  GLUCOSE 77 91 59* 106*  BUN 100* 101* 97* 100*  CREATININE 3.02* 3.09* 2.97* 3.33*  CALCIUM 9.0 8.7* 8.5* 8.9  MG  --  2.3 2.5*  --   PHOS  --   --  3.6  --    GFR: Estimated Creatinine Clearance: 12.4 mL/min (A) (by C-G formula based on SCr of 3.33 mg/dL (H)). Liver Function Tests: Recent Labs  Lab  10/25/23 0759 10/25/23 2012 10/26/23 0518  AST 20 22 25   ALT 25 25 24   ALKPHOS 73 66 66  BILITOT 0.7 0.8 0.9  PROT 7.8 7.4 7.2  ALBUMIN 3.3* 3.2* 3.1*   No results for input(s): "LIPASE", "AMYLASE" in the last 168 hours. No results for input(s): "AMMONIA" in the last 168 hours. Coagulation Profile: No results for input(s): "INR", "PROTIME" in the last 168 hours. Cardiac Enzymes: No results for input(s): "CKTOTAL", "CKMB", "CKMBINDEX", "TROPONINI" in the last 168 hours. BNP (last 3 results) No results for input(s): "PROBNP" in the last 8760 hours. HbA1C: No results for input(s): "HGBA1C" in the last 72 hours. CBG: Recent Labs  Lab 10/26/23 0736 10/26/23 0842 10/26/23 1125  GLUCAP 59* 71 68*   Lipid Profile: No results for input(s): "CHOL", "HDL", "LDLCALC", "TRIG", "CHOLHDL", "LDLDIRECT" in the last 72 hours. Thyroid Function Tests: No results for input(s): "TSH", "T4TOTAL", "FREET4", "T3FREE", "THYROIDAB" in the last 72 hours. Anemia Panel: No results for input(s): "VITAMINB12", "FOLATE", "FERRITIN", "TIBC", "IRON", "RETICCTPCT" in the last 72 hours. Sepsis Labs: Recent Labs  Lab 10/25/23 2013  LATICACIDVEN 1.0    Recent Results (from the past 240 hours)  Urine Culture     Status: Abnormal   Collection Time: 10/25/23  7:47 AM   Specimen: Urine, Random  Result Value Ref Range Status   Specimen Description   Final    URINE, RANDOM Performed at [facility], [address] [place], [place] [address]    Special Requests   Final    NONE Reflexed from [id number] Performed at [facility], [address] [place], [place] [address]    Culture MULTIPLE SPECIES PRESENT, SUGGEST RECOLLECTION (A)  Final   Report Status 10/26/2023 FINAL  Final  Blood Culture (routine x 2)     Status: None (Preliminary result)   Collection Time: 10/25/23  8:13 PM   Specimen: BLOOD  Result Value Ref Range Status   Specimen Description BLOOD BLOOD RIGHT FOREARM  Final   Special Requests    Final    BOTTLES DRAWN AEROBIC AND ANAEROBIC Blood Culture adequate volume   Culture   Final    NO GROWTH < 12 HOURS Performed at [facility], [address] [place], [place] [address]    Report Status PENDING  Incomplete  Blood Culture (routine x 2)     Status: None (Preliminary result)   Collection Time: 10/25/23  8:36 PM   Specimen: BLOOD  Result Value Ref Range Status   Specimen Description BLOOD BLOOD RIGHT HAND  Final   Special Requests   Final    BOTTLES DRAWN AEROBIC AND ANAEROBIC Blood Culture adequate volume   Culture   Final    NO GROWTH < 12 HOURS Performed at [facility], [address], [place], [place] [address]    Report Status PENDING  Incomplete  Radiology Studies: CT CHEST ABDOMEN PELVIS WO CONTRAST Result Date: 10/25/2023 CLINICAL DATA:  Fever.  Delirious.  Sepsis. EXAM: CT CHEST, ABDOMEN AND PELVIS WITHOUT CONTRAST TECHNIQUE: Multidetector CT imaging of the chest, abdomen and pelvis was performed following the standard protocol without IV contrast. RADIATION DOSE REDUCTION: This exam was performed according to the departmental dose-optimization program which includes automated exposure control, adjustment of the mA and/or kV according to patient size and/or use of iterative reconstruction technique. COMPARISON:  Same day chest radiograph; PET/CT 08/31/2023; CT chest 07/07/2023; CT chest abdomen and pelvis 04/29/2023 FINDINGS: CT CHEST FINDINGS Cardiovascular: Small pericardial effusion. Advanced aortic atherosclerotic calcification. Normal heart size. Mediastinum/Nodes: No thoracic adenopathy noting limitations of noncontrast exam and respiratory motion artifact. Trachea and esophagus are unremarkable. Lungs/Pleura: Respiratory motion obscures detail. Emphysema with bullous change in the right apex. Unchanged right upper lobe volume loss, architectural distortion, and traction bronchiectasis, presumably radiation induced. New airspace opacities in the right  lower lobe posteriorly may be due to atelectasis or pneumonia. No pleural effusion or pneumothorax. Musculoskeletal: No acute fracture. Unchanged absence of a portion of the third anterolateral right rib. CT ABDOMEN PELVIS FINDINGS Hepatobiliary: Unremarkable noncontrast appearance of the liver, gallbladder, and biliary tree. Pancreas: Unremarkable. Spleen: No acute abnormality. Adrenals/Urinary Tract: Stable adrenal glands. Since PET/CT 08/31/2023 there is new moderate hydroureter in the proximal ureter. The distal ureter is not well evaluated. Increased marked left hydroureteronephrosis. No ureteral stone. Diffuse bladder wall thickening. Stomach/Bowel: Stomach is within normal limits. Paucity of intra-abdominal fat, lack of IV contrast, and motion artifact limits assessment of the bowel wall. No evidence of obstruction. Large colonic stool burden and stool ball in the rectum. Vascular/Lymphatic: Advanced aortic atherosclerotic calcification. No definite lymphadenopathy. Reproductive: Fiducial markers along the prostate. Other: No free intraperitoneal fluid or air. Musculoskeletal: No acute fracture. IMPRESSION: 1. New airspace opacities in the right lower lobe posteriorly may be due to atelectasis or pneumonia. 2. Since PET/CT 08/31/2023 there is new moderate right hydroureter and increased left hydroureteronephrosis. No ureteral stone. Diffuse bladder wall thickening. Findings may be due to cystitis and ascending urinary tract infection. 3. Constipation with large stool ball in the rectum. Correlate for fecal impaction. Aortic Atherosclerosis (ICD10-I70.0) and Emphysema (ICD10-J43.9). Electronically Signed   By: Minerva Fester M.D.   On: 10/25/2023 21:35   CT Head Wo Contrast Result Date: 10/25/2023 CLINICAL DATA:  Mental status change.  Fever. EXAM: CT HEAD WITHOUT CONTRAST TECHNIQUE: Contiguous axial images were obtained from the base of the skull through the vertex without intravenous contrast. RADIATION  DOSE REDUCTION: This exam was performed according to the departmental dose-optimization program which includes automated exposure control, adjustment of the mA and/or kV according to patient size and/or use of iterative reconstruction technique. COMPARISON:  Head CT 10/25/2023.  MRI brain 01/02/2021. FINDINGS: Brain: No evidence of acute infarction, hemorrhage, hydrocephalus, extra-axial collection or mass lesion/mass effect. Again seen is mild diffuse atrophy and moderate periventricular white matter hypodensity, likely chronic small vessel ischemic change. Vascular: Atherosclerotic calcifications are present within the cavernous internal carotid arteries. Skull: Normal. Negative for fracture or focal lesion. Sinuses/Orbits: No acute finding. Other: None. IMPRESSION: 1. No acute intracranial process. 2. Stable atrophy and chronic small vessel ischemic changes. Electronically Signed   By: Darliss Cheney M.D.   On: 10/25/2023 21:16   DG Chest Port 1 View Result Date: 10/25/2023 CLINICAL DATA:  Questionable sepsis EXAM: PORTABLE CHEST 1 VIEW COMPARISON:  Chest x-ray 10/25/2023.  CT of the chest 07/07/2023. FINDINGS: Parenchymal opacity  in the right upper lobe is unchanged. Emphysema again noted. No new lung infiltrate or pleural effusion. No pneumothorax. The cardiomediastinal silhouette appears stable. IMPRESSION: 1. Parenchymal opacity in the right upper lobe is unchanged. 2. No new lung infiltrate or pleural effusion. Electronically Signed   By: Darliss Cheney M.D.   On: 10/25/2023 20:48   CT Cervical Spine Wo Contrast Result Date: 10/25/2023 CLINICAL DATA:  89 year old male status post fall from bed. Found down. EXAM: CT CERVICAL SPINE WITHOUT CONTRAST TECHNIQUE: Multidetector CT imaging of the cervical spine was performed without intravenous contrast. Multiplanar CT image reconstructions were also generated. RADIATION DOSE REDUCTION: This exam was performed according to the departmental dose-optimization  program which includes automated exposure control, adjustment of the mA and/or kV according to patient size and/or use of iterative reconstruction technique. COMPARISON:  Head CT today.  No prior cervical spine CT. FINDINGS: Alignment: Maintained cervical lordosis. Cervicothoracic junction alignment is within normal limits. Bilateral posterior element alignment is within normal limits. Skull base and vertebrae: Mild motion artifact, less than on the head CT today. Visualized skull base is intact. No atlanto-occipital dissociation. C1 and C2 appear intact and aligned. Heterogeneous bone mineralization although stable compared to chest CT 07/07/2023. No acute osseous abnormality identified. Soft tissues and spinal canal: No prevertebral fluid or swelling. No visible canal hematoma. Calcified cervical carotid atherosclerosis. Disc levels: Generally mild for age, age-appropriate cervical spine degeneration. Up to mild associated cervical spinal stenosis. Upper chest: Emphysema and architectural distortion in the lung apices. Visible upper thoracic levels appear intact. IMPRESSION: 1. No acute traumatic injury identified in the cervical spine. 2. Generally mild for age cervical spine degeneration. 3.  Emphysema (ICD10-J43.9). Electronically Signed   By: Odessa Fleming M.D.   On: 10/25/2023 08:55   CT Head Wo Contrast Result Date: 10/25/2023 CLINICAL DATA:  89 year old male status post fall from bed. Found down. EXAM: CT HEAD WITHOUT CONTRAST TECHNIQUE: Contiguous axial images were obtained from the base of the skull through the vertex without intravenous contrast. RADIATION DOSE REDUCTION: This exam was performed according to the departmental dose-optimization program which includes automated exposure control, adjustment of the mA and/or kV according to patient size and/or use of iterative reconstruction technique. COMPARISON:  Brain MRI 01/02/2021.  Head CT 10/07/2023. FINDINGS: Mildly motion degraded exam today. Brain:  Stable cerebral volume. No midline shift, ventriculomegaly, mass effect, evidence of mass lesion, intracranial hemorrhage or evidence of cortically based acute infarction. Stable gray-white matter differentiation throughout the brain. Patchy bilateral periventricular is stable. Vascular: Calcified atherosclerosis at the skull base. No suspicious intracranial vascular hyperdensity. Skull: Stable, intact. Sinuses/Orbits: Visualized paranasal sinuses and mastoids are stable and well aerated. Other: Postoperative changes to both globes. No acute orbit or scalp soft tissue injury is identified. IMPRESSION: Mildly motion degraded exam with No acute intracranial abnormality or acute traumatic injury identified. Electronically Signed   By: Odessa Fleming M.D.   On: 10/25/2023 08:48   DG Chest Portable 1 View Result Date: 10/25/2023 CLINICAL DATA:  89 year old male with history of altered mental status. EXAM: PORTABLE CHEST 1 VIEW COMPARISON:  Chest x-ray 10/07/2023. FINDINGS: Chronic mass-like area of architectural distortion in the periphery of the right upper lobe with overlying of architectural distortion in the adjacent ribs, similar to the prior study, most compatible with an area of chronic postradiation mass-like fibrosis. Lungs otherwise appear clear. No pleural effusions. No pneumothorax. No evidence of pulmonary edema. Heart size is normal. Upper mediastinal contours are within normal limits. Atherosclerotic calcifications  are noted in the thoracic aorta. IMPRESSION: 1. No radiographic evidence of acute cardiopulmonary disease. 2. Chronic postradiation changes in the right lung, similar to prior studies. Electronically Signed   By: Trudie Reed M.D.   On: 10/25/2023 08:05        Scheduled Meds:  dextromethorphan-guaiFENesin  1 tablet Oral BID   heparin  5,000 Units Subcutaneous Q8H   Continuous Infusions:  cefTRIAXone (ROCEPHIN)  IV     dextrose 5 % and 0.45 % NaCl 75 mL/hr at 10/26/23 1241    doxycycline (VIBRAMYCIN) IV       LOS: 1 day    Time spent: 52 minutes spent on chart review, discussion with nursing staff, consultants, updating family and interview/physical exam; more than 50% of that time was spent in counseling and/or coordination of care.    Alvira Philips [place], DO [facility] Available via Epic secure chat 7am-7pm After these hours, please refer to coverage provider listed on [email or web address] 10/26/2023, 4:20 PM

## 2023-10-26 NOTE — Evaluation (Signed)
Clinical/Bedside Swallow Evaluation Patient Details  Name: [name] MRN: [id number] Date of Birth: [birth year]  Today's Date: 10/26/2023 Time: SLP Start Time (ACUTE ONLY): 1802 SLP Stop Time (ACUTE ONLY): 1824 SLP Time Calculation (min) (ACUTE ONLY): 22 min  Past Medical History:  Past Medical History:  Diagnosis Date   Arthritis    Hypertension    Hyperthyroidism    lung ca 11/2020   Past Surgical History:  Past Surgical History:  Procedure Laterality Date   COLONOSCOPY N/A 09/02/2016   Procedure: COLONOSCOPY;  Surgeon: [name], MD;  Location: [facility];  Service: Endoscopy;  Laterality: N/A;  830   NO PAST SURGERIES     POLYPECTOMY  09/02/2016   Procedure: POLYPECTOMY;  Surgeon: [name], MD;  Location: [facility];  Service: Endoscopy;;   HPI:  [name] is a 89 y.o. male with past medical history significant for HTN, hypothyroidism, Stage IV NSCLC, chronic hypoxic respiratory failure on 2 L nasal cannula at baseline, BPH who presented to [facility] ED on 10/25/2023 from [facility] via EMS for confusion after patient was found on the ground out of the bed with his oxygen off.  Patient unable to contribute to HPI given his confusion, most obtained from ED physician and medical record.  Apparently patient has been delirious for roughly 2 days, at baseline able to maintain a conversation.  He is nonambulatory at baseline but reported to have a fever of 103.2 F at the facility evening prior to transfer.  Tylenol was given and patient was transported to [name], ED for further evaluation and management per EMS.  CT head without contrast with mildly motion degraded with no acute intracranial malady.  CT C-spine with no acute traumatic injury identified in the C-spine.  CT chest/abdomen/pelvis with new airspace opacities right lower lobe consistent with pneumonia, moderate right hydroureter and increased left hydroureteronephrosis, no ureteral  stone, diffuse bladder wall thickening, constipation with large stool ball in the rectum.  BSE requested.    Assessment / Plan / Recommendation  Clinical Impression  Clinical swallowing evaluation completed while Pt was sitting upright in bed with mitts on hands. Pt reports that he no longer has his dentures, but it able to eat most foods. Pt without overt signs or symptoms of aspiration with thins via cup/straw or with puree. He demonstrated prolonged mastication with mech soft and exhibited min lingual residue which cleared with liquid wash. Given reports of Pt pocketing solids and pills, recommend D2 and thin liquids. Medications crushed in puree and SLP will follow during acute stay.  SLP Visit Diagnosis: Dysphagia, unspecified (R13.10)    Aspiration Risk  Risk for inadequate nutrition/hydration    Diet Recommendation Dysphagia 2 (Fine chop);Thin liquid    Liquid Administration via: Cup;Straw Medication Administration: Crushed with puree Supervision: Staff to assist with self feeding;Full supervision/cueing for compensatory strategies Compensations: Slow rate;Small sips/bites Postural Changes: Seated upright at 90 degrees;Remain upright for at least 30 minutes after po intake    Other  Recommendations Oral Care Recommendations: Oral care BID    Recommendations for follow up therapy are one component of a multi-disciplinary discharge planning process, led by the attending physician.  Recommendations may be updated based on patient status, additional functional criteria and insurance authorization.  Follow up Recommendations Follow physician's recommendations for discharge plan and follow up therapies      Assistance Recommended at Discharge    Functional Status Assessment Patient has had a recent decline  in their functional status and demonstrates the ability to make significant improvements in function in a reasonable and predictable amount of time.  Frequency and Duration min 2x/week   1 week       Prognosis Prognosis for improved oropharyngeal function: Fair Barriers to Reach Goals: Cognitive deficits      Swallow Study   General Date of Onset: 10/25/23 HPI: [name] is a 89 y.o. male with past medical history significant for HTN, hypothyroidism, Stage IV NSCLC, chronic hypoxic respiratory failure on 2 L nasal cannula at baseline, BPH who presented to [facility] ED on 10/25/2023 from [facility] via EMS for confusion after patient was found on the ground out of the bed with his oxygen off.  Patient unable to contribute to HPI given his confusion, most obtained from ED physician and medical record.  Apparently patient has been delirious for roughly 2 days, at baseline able to maintain a conversation.  He is nonambulatory at baseline but reported to have a fever of 103.2 F at the facility evening prior to transfer.  Tylenol was given and patient was transported to [name], ED for further evaluation and management per EMS.  CT head without contrast with mildly motion degraded with no acute intracranial malady.  CT C-spine with no acute traumatic injury identified in the C-spine.  CT chest/abdomen/pelvis with new airspace opacities right lower lobe consistent with pneumonia, moderate right hydroureter and increased left hydroureteronephrosis, no ureteral stone, diffuse bladder wall thickening, constipation with large stool ball in the rectum.  BSE requested. Type of Study: Bedside Swallow Evaluation Previous Swallow Assessment: BSE January 2025 regular/thin Diet Prior to this Study: Dysphagia 1 (pureed);Mildly thick liquids (Level 2, nectar thick) Temperature Spikes Noted: No Respiratory Status: Room air History of Recent Intubation: No Behavior/Cognition: Alert;Cooperative;Pleasant mood Oral Cavity Assessment: Within Functional Limits Oral Care Completed by SLP: Recent completion by staff Oral Cavity - Dentition: Edentulous;Dentures, not available Vision:   (SLP fed as Pt in mitts) Patient Positioning: Upright in bed Baseline Vocal Quality: Normal Volitional Cough: Strong Volitional Swallow: Able to elicit    Oral/Motor/Sensory Function Overall Oral Motor/Sensory Function: Within functional limits   Ice Chips Ice chips: Within functional limits Presentation: Spoon   Thin Liquid Thin Liquid: Within functional limits Presentation: Cup;Straw    Nectar Thick Nectar Thick Liquid: Not tested   Honey Thick Honey Thick Liquid: Not tested   Puree Puree: Within functional limits Presentation: Spoon   Solid     Solid: Impaired Presentation: Spoon Oral Phase Impairments: Impaired mastication Oral Phase Functional Implications: Oral residue     Thank you,  [name], CCC-SLP [phone] ] [name] 10/26/2023,6:53 PM

## 2023-10-26 NOTE — ED Notes (Addendum)
Went to assess pt and pt HR was in the 120. Another EKG performed and given to EDP. Is is alert but not oriented. EDP to assess pt. Hospitalist made aware.   Pt linen changed and malewick placed on pt.

## 2023-10-26 NOTE — Progress Notes (Signed)
Initial Nutrition Assessment  DOCUMENTATION CODES:   Severe malnutrition in context of chronic illness, Underweight  INTERVENTION:   Change to dysphagia 1 with nectar thick liquids until SLP can assess his swallowing function.  Continue Ensure Plus High Protein po with  medications, each supplement provides 350 kcal and 20 grams of protein. Magic cup TID with meals, each supplement provides 290 kcal and 9 grams of protein.  NUTRITION DIAGNOSIS:   Severe Malnutrition related to chronic illness (COPD, CHF) as evidenced by severe muscle depletion, severe fat depletion, percent weight loss (13% weight loss within 3 months).  GOAL:   Patient will meet greater than or equal to 90% of their needs  MONITOR:   PO intake, Supplement acceptance  REASON FOR ASSESSMENT:   Consult Assessment of nutrition requirement/status  ASSESSMENT:   89 yo male admitted with CAP, FTT. PMH includes HTN, hyperthyroidism, arthritis, lung cancer, COPD, CHF.  Patient unable to provide any nutrition hx.  Currently on a heart healthy diet. Meal intakes not recorded. RN reports that patient had difficulty with his regular consistency diet and thin liquids. She plans to order a SLP evaluation. RD to downgrade diet to pureed foods and nectar thick liquids until SLP can evaluate. RN has been giving meds with Ensure.  Labs reviewed. K 5.4 CBG: 59-71  Medications reviewed and include Lokelma x 1 dose, IV calcium gluconate.   Weight history reviewed. Patient with 13% weight loss within the past 3 months. Patient meets criteria for severe malnutrition, given severe depletion of muscle and subcutaneous fat mass.  NUTRITION - FOCUSED PHYSICAL EXAM:  Flowsheet Row Most Recent Value  Orbital Region Severe depletion  Upper Arm Region Severe depletion  Thoracic and Lumbar Region Severe depletion  Buccal Region Severe depletion  Temple Region Severe depletion  Clavicle Bone Region Severe depletion  Clavicle and  Acromion Bone Region Severe depletion  Scapular Bone Region Severe depletion  Dorsal Hand Severe depletion  Patellar Region Severe depletion  Anterior Thigh Region Severe depletion  Posterior Calf Region Severe depletion  Edema (RD Assessment) None  Hair Reviewed  Eyes Reviewed  Mouth Reviewed  Skin Reviewed  Nails Reviewed       Diet Order:  Heart Healthy  EDUCATION NEEDS:   Not appropriate for education at this time  Skin:  Skin Assessment: Reviewed RN Assessment  Last BM:  unknown  Height:   Ht Readings from Last 1 Encounters:  10/25/23 6\' 2"  (1.88 m)    Weight:   Wt Readings from Last 1 Encounters:  10/25/23 57 kg    Ideal Body Weight:  86.4 kg  BMI:  Body mass index is 16.13 kg/m.  Estimated Nutritional Needs:   Kcal:  1800-2000  Protein:  75-95 gm  Fluid:  1.8-2 L   Gabriel Rainwater RD, LDN, CNSC Contact Inpatient RD using Secure Chat. If unavailable, use group chat "RD Inpatient" via Secure Chat in [employer].

## 2023-10-27 DIAGNOSIS — N184 Chronic kidney disease, stage 4 (severe): Secondary | ICD-10-CM | POA: Diagnosis not present

## 2023-10-27 DIAGNOSIS — N139 Obstructive and reflux uropathy, unspecified: Secondary | ICD-10-CM | POA: Diagnosis not present

## 2023-10-27 DIAGNOSIS — J189 Pneumonia, unspecified organism: Secondary | ICD-10-CM | POA: Diagnosis not present

## 2023-10-27 DIAGNOSIS — N179 Acute kidney failure, unspecified: Secondary | ICD-10-CM | POA: Diagnosis not present

## 2023-10-27 LAB — MAGNESIUM: Magnesium: 2.3 mg/dL (ref 1.7–2.4)

## 2023-10-27 LAB — COMPREHENSIVE METABOLIC PANEL
ALT: 23 U/L (ref 0–44)
AST: 25 U/L (ref 15–41)
Albumin: 3.1 g/dL — ABNORMAL LOW (ref 3.5–5.0)
Alkaline Phosphatase: 65 U/L (ref 38–126)
Anion gap: 9 (ref 5–15)
BUN: 85 mg/dL — ABNORMAL HIGH (ref 8–23)
CO2: 23 mmol/L (ref 22–32)
Calcium: 8.9 mg/dL (ref 8.9–10.3)
Chloride: 111 mmol/L (ref 98–111)
Creatinine, Ser: 2.91 mg/dL — ABNORMAL HIGH (ref 0.61–1.24)
GFR, Estimated: 20 mL/min — ABNORMAL LOW (ref >60)
Glucose, Bld: 91 mg/dL (ref 70–99)
Potassium: 4.5 mmol/L (ref 3.5–5.1)
Sodium: 143 mmol/L (ref 135–145)
Total Bilirubin: 0.7 mg/dL (ref 0.0–1.2)
Total Protein: 7.6 g/dL (ref 6.5–8.1)

## 2023-10-27 LAB — CBC
HCT: 29.1 % — ABNORMAL LOW (ref 39.0–52.0)
Hemoglobin: 9.1 g/dL — ABNORMAL LOW (ref 13.0–17.0)
MCH: 29.2 pg (ref 26.0–34.0)
MCHC: 31.3 g/dL (ref 30.0–36.0)
MCV: 93.3 fL (ref 80.0–100.0)
Platelets: 204 10*3/uL (ref 150–400)
RBC: 3.12 MIL/uL — ABNORMAL LOW (ref 4.22–5.81)
RDW: 19.2 % — ABNORMAL HIGH (ref 11.5–15.5)
WBC: 5.8 10*3/uL (ref 4.0–10.5)
nRBC: 0 % (ref 0.0–0.2)

## 2023-10-27 LAB — GLUCOSE, CAPILLARY
Glucose-Capillary: 103 mg/dL — ABNORMAL HIGH (ref 70–99)
Glucose-Capillary: 104 mg/dL — ABNORMAL HIGH (ref 70–99)
Glucose-Capillary: 110 mg/dL — ABNORMAL HIGH (ref 70–99)
Glucose-Capillary: 126 mg/dL — ABNORMAL HIGH (ref 70–99)
Glucose-Capillary: 93 mg/dL (ref 70–99)
Glucose-Capillary: 94 mg/dL (ref 70–99)
Glucose-Capillary: 99 mg/dL (ref 70–99)

## 2023-10-27 LAB — PHOSPHORUS: Phosphorus: 3.1 mg/dL (ref 2.5–4.6)

## 2023-10-27 MED ORDER — DOXYCYCLINE HYCLATE 100 MG IV SOLR
100.0000 mg | Freq: Two times a day (BID) | INTRAVENOUS | Status: AC
  Administered 2023-10-27: 100 mg via INTRAVENOUS
  Filled 2023-10-27: qty 100

## 2023-10-27 MED ORDER — SENNOSIDES-DOCUSATE SODIUM 8.6-50 MG PO TABS
1.0000 | ORAL_TABLET | Freq: Two times a day (BID) | ORAL | Status: DC
  Administered 2023-10-27 – 2023-10-29 (×5): 1 via ORAL
  Filled 2023-10-27 (×5): qty 1

## 2023-10-27 MED ORDER — SODIUM CHLORIDE 0.9 % IV SOLN: 100.0000 mg | Freq: Two times a day (BID) | INTRAVENOUS | Status: DC

## 2023-10-27 NOTE — Progress Notes (Signed)
PROGRESS NOTE    [name]  ZOX:[id number] DOB: 04/18/1933 DOA: 10/25/2023 PCP: Micael Hampshire, FNP    Brief Narrative:   [name] is a 89 y.o. male with past medical history significant for HTN, hypothyroidism, Stage IV NSCLC, chronic hypoxic respiratory failure on 2 L nasal cannula at baseline, BPH who presented to [facility] ED on 10/25/2023 from [name] via EMS for confusion after patient was found on the ground out of the bed with his oxygen off.  Patient unable to contribute to HPI given his confusion, most obtained from ED physician and medical record.  Apparently patient has been delirious for roughly 2 days, at baseline able to maintain a conversation.  He is nonambulatory at baseline but reported to have a fever of 103.2 F at the facility evening prior to transfer.  Tylenol was given and patient was transported to [name], ED for further evaluation and management per EMS.  In the ED, temperature 98.3 F, HR 104, RR 22, BP 152/84, SpO2 97% on 3 L nasal cannula.  WBC 7.2, hemoglobin 9.9, platelet count 235.  Sodium 140, potassium 5.0, chloride 106, CO2 25, glucose 77, BUN 100, creatinine 3.02.  AST 20, ALT 25, total bilirubin 0.7.  High-sensitivity troponin 15>14.  Urinalysis with moderate leukocytes, negative nitrite, 21-50 WBCs.  CT head without contrast with mildly motion degraded with no acute intracranial malady.  CT C-spine with no acute traumatic injury identified in the C-spine.  CT chest/abdomen/pelvis with new airspace opacities right lower lobe consistent with pneumonia, moderate right hydroureter and increased left hydroureteronephrosis, no ureteral stone, diffuse bladder wall thickening, constipation with large stool ball in the rectum.  Patient was given IV ceftriaxone, doxycycline for concern of UTI versus pneumonia.  Patient was given Haldol.  TRH consulted for admission for further evaluation management of acute metabolic encephalopathy likely  secondary to infectious process with UTI versus pneumonia.  Assessment & Plan:   Acute metabolic encephalopathy Patient presenting to the ED with confusion per records from SNF, typically able to carry on a conversation at baseline.  Was noted to be confused over the last 2 days with fever reported 103.2 F at the facility.  CT head without contrast with no acute findings.  Urinalysis consistent with UTI and chest CT notable for airspace opacities consistent with pneumonia; also with uremia from AKI likely contributing factor.  Also received Haldol in the ED; likely further contributing factor. -- Supportive care, treatment as below  Community-acquired pneumonia Patient with fever 103.2 F at the facility prior to arrival.  CT chest with new airspace opacities right lower lobe consistent with pneumonia. -- Blood cultures x 2: No growth x 2 days -- Strep pneumo urinary antigen: Negative -- Urine Legionella antigen: Pending -- Doxycycline 100 mg IV every 12 hours -- Ceftriaxone 1 g IV every 24 hours -- Incentive spirometry/flutter valve -- Continue supplemental oxygen maintain SpO2 greater than 92%, on 2 L nasal cannula which is baseline  Urinary tract infection Urinalysis with moderate leukocytes, negative nitrite, 21-50 WBCs. -- Urine culture: Multiple species present -- Continue ceftriaxone as above  Acute renal failure on CKD stage IV Obstructive uropathy, Hx BPH BUN elevated 101 with creatinine 3.09 on admission (baseline creatinine at 2.5-2.7).  CT abdomen/pelvis notable for moderate right hydroureter with increased left hydroureteronephrosis and diffuse bladder wall thickening.  After self forwarding on 1/29 bladder scan shows continued retained urine of 517 mL. -- Cr 3.09>2.97>3.33>2.91 -- Foley catheter: Ordered --Tamsulosin 0.4 mg  p.o. daily -- Continue IVF w/ D% 1/2 NS at 75 mL/h  Hyperkalemia: Resolved Potassium 5.4 admission, likely secondary to acute renal failure as  above.  Received Lokelma and IV fluid hydration with resolution of elevated potassium. -- Continue IV fluid hydration -- Repeat BMP in a.m.  Dysphagia Seen by speech therapy. -- Dysphagia 2 diet (fine chop), thin liquids -- Aspiration precautions  Essential hypertension -- Amlodipine 10 mg p.o. daily -- Labetalol/hydralazine PRN SBP >165  Hypothyroidism -- Levothyroxine 150 mcg p.o. daily  Chronic hypoxic respiratory failure COPD, not in acute exacerbation -- Breo Ellipta 1 puff daily (substituted for home Advair) -- Continue Spiriva -- Albuterol neb every 4 hours as needed wheezing/shortness of breath -- Continue supplemental oxygen, maintain SpO2 greater than 92%, on 2 L which is at baseline  Chronic diastolic congestive heart failure, compensated -- Hold home furosemide for now -- Strict I's and O's and daily weights  Stage IV non small cell lung cancer Follows with medical oncology outpatient, Dr. Shirline Frees.  S/p chemo radiation followed by immunotherapy.  Currently on observation.  Dementia --Delirium precautions --Get up during the day --Encourage a familiar face to remain present throughout the day --Keep blinds open and lights on during daylight hours --Minimize the use of opioids/benzodiazepines  Adult failure to thrive Generalized weakness/debility/deconditioning/gait disturbance: Seen by palliative care during most recent hospitalization, DNR.  At baseline nonambulatory.  From [facility].  If patient does not improve considerably back to his baseline with treatment as above, may need to consider transitioning to a more comfort/palliative approach. -- TOC following   DVT prophylaxis: heparin injection 5,000 Units Start: 10/26/23 0600 SCDs Start: 10/26/23 0453    Code Status: Limited: Do not attempt resuscitation (DNR) -DNR-LIMITED -Do Not Intubate/DNI  Family Communication: No family present at bedside this morning, updated daughters present at bedside  yesterday afternoon.  Disposition Plan:  Level of care: Med-Surg Status is: Inpatient Remains inpatient appropriate because: IV antibiotics; needs Foley catheter replacement.    Consultants:  None  Procedures:  None  Antimicrobials:  Doxycycline 1/27>> Ceftriaxone 1/27>>   Subjective: Patient seen examined bedside, sitting at edge of bed with multiple nursing staff, therapy personnel present.  Attempting to get to bedside chair.  RN reports bladder scan with 517 mL of retained urine despite urinating earlier in the morning.  Discussed will need placement of Foley given his elevated creatinine likely secondary to obstructive uropathy.  Patient remains confused, slow response to questions.  No family present this morning.  Denies chest pain, no shortness of breath, no abdominal pain.  No acute concerns overnight per nursing staff.  Objective: Vitals:   10/26/23 2035 10/26/23 2348 10/27/23 0411 10/27/23 0903  BP: (!) 157/88 (!) 166/88 (!) 143/81   Pulse: 89 98 (!) 103   Resp: 18 17 18    Temp: 97.7 F (36.5 C) 97.8 F (36.6 C) 98.3 F (36.8 C)   TempSrc: Oral Oral Oral   SpO2: 95% 94% 91% 98%  Weight:   60.2 kg   Height:        Intake/Output Summary (Last 24 hours) at 10/27/2023 1200 Last data filed at 10/27/2023 0950 Gross per 24 hour  Intake 413.75 ml  Output 1750 ml  Net -1336.25 ml   Filed Weights   10/25/23 1937 10/27/23 0411  Weight: 57 kg 60.2 kg    Examination:  Physical Exam: GEN: NAD, alert, oriented to person (self), time (2025), but not place (office), or situation HEENT: NCAT, PERRL, EOMI,  sclera clear, dry mucous membranes PULM: Breath sounds slight diminished bilateral bases, no wheezes/crackles, normal respiratory effort without accessory muscle use, on 2 L nasal cannula which is at his baseline. CV: RRR w/o M/G/R GI: abd soft, NTND, + BS MSK: no peripheral edema, moves all extremities independently NEURO: No focal neurological  deficits Integumentary: Noted vitiligo, otherwise no other concerning rashes/lesions/wounds noted on exposed skin surfaces    Data Reviewed: I have personally reviewed following labs and imaging studies  CBC: Recent Labs  Lab 10/25/23 0759 10/25/23 2012 10/26/23 0518 10/27/23 0449  WBC 7.2 7.5 7.4 5.8  NEUTROABS 6.1 5.8  --   --   HGB 9.9* 9.0* 10.2* 9.1*  HCT 32.1* 28.4* 33.8* 29.1*  MCV 93.0 91.9 98.5 93.3  PLT 235 233 181 204   Basic Metabolic Panel: Recent Labs  Lab 10/25/23 0759 10/25/23 2012 10/26/23 0518 10/26/23 1249 10/27/23 0449  NA 140 140 140 142 143  K 5.0 5.2* 5.4* 4.9 4.5  CL 106 108 109 110 111  CO2 25 23 20* 22 23  GLUCOSE 77 91 59* 106* 91  BUN 100* 101* 97* 100* 85*  CREATININE 3.02* 3.09* 2.97* 3.33* 2.91*  CALCIUM 9.0 8.7* 8.5* 8.9 8.9  MG  --  2.3 2.5*  --  2.3  PHOS  --   --  3.6  --  3.1   GFR: Estimated Creatinine Clearance: 14.9 mL/min (A) (by C-G formula based on SCr of 2.91 mg/dL (H)). Liver Function Tests: Recent Labs  Lab 10/25/23 0759 10/25/23 2012 10/26/23 0518 10/27/23 0449  AST 20 22 25 25   ALT 25 25 24 23   ALKPHOS 73 66 66 65  BILITOT 0.7 0.8 0.9 0.7  PROT 7.8 7.4 7.2 7.6  ALBUMIN 3.3* 3.2* 3.1* 3.1*   No results for input(s): "LIPASE", "AMYLASE" in the last 168 hours. No results for input(s): "AMMONIA" in the last 168 hours. Coagulation Profile: No results for input(s): "INR", "PROTIME" in the last 168 hours. Cardiac Enzymes: No results for input(s): "CKTOTAL", "CKMB", "CKMBINDEX", "TROPONINI" in the last 168 hours. BNP (last 3 results) No results for input(s): "PROBNP" in the last 8760 hours. HbA1C: No results for input(s): "HGBA1C" in the last 72 hours. CBG: Recent Labs  Lab 10/26/23 1641 10/26/23 2038 10/27/23 0353 10/27/23 0745 10/27/23 1115  GLUCAP 88 97 104* 94 99   Lipid Profile: No results for input(s): "CHOL", "HDL", "LDLCALC", "TRIG", "CHOLHDL", "LDLDIRECT" in the last 72 hours. Thyroid  Function Tests: No results for input(s): "TSH", "T4TOTAL", "FREET4", "T3FREE", "THYROIDAB" in the last 72 hours. Anemia Panel: No results for input(s): "VITAMINB12", "FOLATE", "FERRITIN", "TIBC", "IRON", "RETICCTPCT" in the last 72 hours. Sepsis Labs: Recent Labs  Lab 10/25/23 2013  LATICACIDVEN 1.0    Recent Results (from the past 240 hours)  Urine Culture     Status: Abnormal   Collection Time: 10/25/23  7:47 AM   Specimen: Urine, Random  Result Value Ref Range Status   Specimen Description   Final    URINE, RANDOM Performed at [facility], [address], [place], [place] [address]    Special Requests   Final    NONE Reflexed from [id number] Performed at [facility], [address] [place], [place] [address]    Culture MULTIPLE SPECIES PRESENT, SUGGEST RECOLLECTION (A)  Final   Report Status 10/26/2023 FINAL  Final  Blood Culture (routine x 2)     Status: None (Preliminary result)   Collection Time: 10/25/23  8:13 PM   Specimen:  BLOOD  Result Value Ref Range Status   Specimen Description BLOOD BLOOD RIGHT FOREARM  Final   Special Requests   Final    BOTTLES DRAWN AEROBIC AND ANAEROBIC Blood Culture adequate volume   Culture   Final    NO GROWTH 2 DAYS Performed at [facility], [address] [place], [place] [address]    Report Status PENDING  Incomplete  Blood Culture (routine x 2)     Status: None (Preliminary result)   Collection Time: 10/25/23  8:36 PM   Specimen: BLOOD  Result Value Ref Range Status   Specimen Description BLOOD BLOOD RIGHT HAND  Final   Special Requests   Final    BOTTLES DRAWN AEROBIC AND ANAEROBIC Blood Culture adequate volume   Culture   Final    NO GROWTH 2 DAYS Performed at [facility], [address], [place], [place] [address]    Report Status PENDING  Incomplete         Radiology Studies: CT CHEST ABDOMEN PELVIS WO CONTRAST Result Date: 10/25/2023 CLINICAL DATA:  Fever.  Delirious.  Sepsis. EXAM: CT CHEST, ABDOMEN AND PELVIS  WITHOUT CONTRAST TECHNIQUE: Multidetector CT imaging of the chest, abdomen and pelvis was performed following the standard protocol without IV contrast. RADIATION DOSE REDUCTION: This exam was performed according to the departmental dose-optimization program which includes automated exposure control, adjustment of the mA and/or kV according to patient size and/or use of iterative reconstruction technique. COMPARISON:  Same day chest radiograph; PET/CT 08/31/2023; CT chest 07/07/2023; CT chest abdomen and pelvis 04/29/2023 FINDINGS: CT CHEST FINDINGS Cardiovascular: Small pericardial effusion. Advanced aortic atherosclerotic calcification. Normal heart size. Mediastinum/Nodes: No thoracic adenopathy noting limitations of noncontrast exam and respiratory motion artifact. Trachea and esophagus are unremarkable. Lungs/Pleura: Respiratory motion obscures detail. Emphysema with bullous change in the right apex. Unchanged right upper lobe volume loss, architectural distortion, and traction bronchiectasis, presumably radiation induced. New airspace opacities in the right lower lobe posteriorly may be due to atelectasis or pneumonia. No pleural effusion or pneumothorax. Musculoskeletal: No acute fracture. Unchanged absence of a portion of the third anterolateral right rib. CT ABDOMEN PELVIS FINDINGS Hepatobiliary: Unremarkable noncontrast appearance of the liver, gallbladder, and biliary tree. Pancreas: Unremarkable. Spleen: No acute abnormality. Adrenals/Urinary Tract: Stable adrenal glands. Since PET/CT 08/31/2023 there is new moderate hydroureter in the proximal ureter. The distal ureter is not well evaluated. Increased marked left hydroureteronephrosis. No ureteral stone. Diffuse bladder wall thickening. Stomach/Bowel: Stomach is within normal limits. Paucity of intra-abdominal fat, lack of IV contrast, and motion artifact limits assessment of the bowel wall. No evidence of obstruction. Large colonic stool burden and  stool ball in the rectum. Vascular/Lymphatic: Advanced aortic atherosclerotic calcification. No definite lymphadenopathy. Reproductive: Fiducial markers along the prostate. Other: No free intraperitoneal fluid or air. Musculoskeletal: No acute fracture. IMPRESSION: 1. New airspace opacities in the right lower lobe posteriorly may be due to atelectasis or pneumonia. 2. Since PET/CT 08/31/2023 there is new moderate right hydroureter and increased left hydroureteronephrosis. No ureteral stone. Diffuse bladder wall thickening. Findings may be due to cystitis and ascending urinary tract infection. 3. Constipation with large stool ball in the rectum. Correlate for fecal impaction. Aortic Atherosclerosis (ICD10-I70.0) and Emphysema (ICD10-J43.9). Electronically Signed   By: Minerva Fester M.D.   On: 10/25/2023 21:35   CT Head Wo Contrast Result Date: 10/25/2023 CLINICAL DATA:  Mental status change.  Fever. EXAM: CT HEAD WITHOUT CONTRAST TECHNIQUE: Contiguous axial images were obtained from the base of the skull through  the vertex without intravenous contrast. RADIATION DOSE REDUCTION: This exam was performed according to the departmental dose-optimization program which includes automated exposure control, adjustment of the mA and/or kV according to patient size and/or use of iterative reconstruction technique. COMPARISON:  Head CT 10/25/2023.  MRI brain 01/02/2021. FINDINGS: Brain: No evidence of acute infarction, hemorrhage, hydrocephalus, extra-axial collection or mass lesion/mass effect. Again seen is mild diffuse atrophy and moderate periventricular white matter hypodensity, likely chronic small vessel ischemic change. Vascular: Atherosclerotic calcifications are present within the cavernous internal carotid arteries. Skull: Normal. Negative for fracture or focal lesion. Sinuses/Orbits: No acute finding. Other: None. IMPRESSION: 1. No acute intracranial process. 2. Stable atrophy and chronic small vessel ischemic  changes. Electronically Signed   By: Darliss Cheney M.D.   On: 10/25/2023 21:16   DG Chest Port 1 View Result Date: 10/25/2023 CLINICAL DATA:  Questionable sepsis EXAM: PORTABLE CHEST 1 VIEW COMPARISON:  Chest x-ray 10/25/2023.  CT of the chest 07/07/2023. FINDINGS: Parenchymal opacity in the right upper lobe is unchanged. Emphysema again noted. No new lung infiltrate or pleural effusion. No pneumothorax. The cardiomediastinal silhouette appears stable. IMPRESSION: 1. Parenchymal opacity in the right upper lobe is unchanged. 2. No new lung infiltrate or pleural effusion. Electronically Signed   By: Darliss Cheney M.D.   On: 10/25/2023 20:48        Scheduled Meds:  amLODipine  10 mg Oral Daily   buPROPion ER  100 mg Oral BID   dextromethorphan-guaiFENesin  1 tablet Oral BID   fluticasone furoate-vilanterol  1 puff Inhalation Daily   heparin  5,000 Units Subcutaneous Q8H   levothyroxine  150 mcg Oral Daily   melatonin  3 mg Oral QHS   senna-docusate  1 tablet Oral BID   tamsulosin  0.4 mg Oral Daily   umeclidinium bromide  1 puff Inhalation Daily   Continuous Infusions:  cefTRIAXone (ROCEPHIN)  IV 1 g (10/26/23 2233)   dextrose 5 % and 0.45 % NaCl 75 mL/hr at 10/27/23 0414   doxycycline (VIBRAMYCIN) 100 mg in dextrose 5 % 250 mL IVPB     doxycycline (VIBRAMYCIN) IV 100 mg (10/26/23 2331)     LOS: 2 days    Time spent: 52 minutes spent on chart review, discussion with nursing staff, consultants, updating family and interview/physical exam; more than 50% of that time was spent in counseling and/or coordination of care.    Alvira Philips [place], DO [facility] Available via Epic secure chat 7am-7pm After these hours, please refer to coverage provider listed on [email or web address] 10/27/2023, 12:00 PM

## 2023-10-27 NOTE — Progress Notes (Signed)
Speech Language Pathology Treatment: Dysphagia  Patient Details Name: [name] MRN: [id number] DOB: 19-Nov-1932 Today's Date: 10/27/2023 Time: 1540-1600 SLP Time Calculation (min) (ACUTE ONLY): 20 min  Assessment / Plan / Recommendation Clinical Impression  Ongoing dysphagia therapy provided today. Pt consumed soft peaches and thin liquids via straw without overt s/sx of oropharyngeal dysphagia. Note some prolonged mastication with soft textures but with prolonged time, minimal verbal cues and thin liquid wash no oral residue was noted after the swallow. Recommend continue with D2/fine chop and thin liquids. Recommend strategies re: small bites and sips, allow extra time and encourage alternating bites and sips to facilitate oral clearance. There are no further ST needs noted at this time, our service will sign off. Thank you,   HPI HPI: [name] is a 89 y.o. male with past medical history significant for HTN, hypothyroidism, Stage IV NSCLC, chronic hypoxic respiratory failure on 2 L nasal cannula at baseline, BPH who presented to [facility] ED on 10/25/2023 from [facility] via EMS for confusion after patient was found on the ground out of the bed with his oxygen off.  Patient unable to contribute to HPI given his confusion, most obtained from ED physician and medical record.  Apparently patient has been delirious for roughly 2 days, at baseline able to maintain a conversation.  He is nonambulatory at baseline but reported to have a fever of 103.2 F at the facility evening prior to transfer.  Tylenol was given and patient was transported to [name] ED for further evaluation and management per EMS.  CT head without contrast with mildly motion degraded with no acute intracranial malady.  CT C-spine with no acute traumatic injury identified in the C-spine.  CT chest/abdomen/pelvis with new airspace opacities right lower lobe consistent with pneumonia, moderate right hydroureter  and increased left hydroureteronephrosis, no ureteral stone, diffuse bladder wall thickening, constipation with large stool ball in the rectum.  BSE requested.      SLP Plan  All goals met      Recommendations for follow up therapy are one component of a multi-disciplinary discharge planning process, led by the attending physician.  Recommendations may be updated based on patient status, additional functional criteria and insurance authorization.    Recommendations  Diet recommendations: Dysphagia 2 (fine chop);Thin liquid Liquids provided via: Cup;Straw Medication Administration: Crushed with puree Supervision: Patient able to self feed Compensations: Slow rate;Small sips/bites Postural Changes and/or Swallow Maneuvers: Seated upright 90 degrees                  Oral care BID     Dysphagia, unspecified (R13.10)     All goals met    Anesa Fronek H. Romie Levee, CCC-SLP Speech Language Pathologist  [name]  10/27/2023, 4:00 PM

## 2023-10-27 NOTE — Progress Notes (Signed)
Mobility Specialist Progress Note:    10/27/23 1010  Mobility  Activity Transferred from chair to bed  Level of Assistance +2 (takes two people)  Press photographer wheel walker  Distance Ambulated (ft) 2 ft  Range of Motion/Exercises Active;All extremities  Activity Response Tolerated well  Mobility Referral Yes  Mobility visit 1 Mobility  Mobility Specialist Start Time (ACUTE ONLY) 0955  Mobility Specialist Stop Time (ACUTE ONLY) 1010  Mobility Specialist Time Calculation (min) (ACUTE ONLY) 15 min   Pt received in chair, agreeable to transfer. Required MaxA +2 to stand and transfer with RW. Tolerated well, asx throughout. Left pt supine, alarm on. All needs met.   Lawerance Bach Mobility Specialist Please contact via Special educational needs teacher or  Rehab office at [phone]

## 2023-10-27 NOTE — Plan of Care (Signed)
  Problem: Acute Rehab OT Goals (only OT should resolve) Goal: Pt. Will Perform Grooming Flowsheets (Taken 10/27/2023 0926) Pt Will Perform Grooming:  with modified independence  sitting Goal: Pt. Will Perform Upper Body Dressing Flowsheets (Taken 10/27/2023 0926) Pt Will Perform Upper Body Dressing:  with supervision  sitting Goal: Pt. Will Perform Lower Body Dressing Flowsheets (Taken 10/27/2023 0926) Pt Will Perform Lower Body Dressing:  with contact guard assist  sitting/lateral leans Goal: Pt. Will Transfer To Toilet Flowsheets (Taken 10/27/2023 [phone]) Pt Will Transfer to Toilet:  with min assist  stand pivot transfer Goal: Pt. Will Perform Toileting-Clothing Manipulation Flowsheets (Taken 10/27/2023 0926) Pt Will Perform Toileting - Clothing Manipulation and hygiene:  with mod assist  sitting/lateral leans Goal: Pt/Caregiver Will Perform Home Exercise Program Flowsheets (Taken 10/27/2023 [phone]) Pt/caregiver will Perform Home Exercise Program:  Increased ROM  Increased strength  Both right and left upper extremity  With minimal assist  Jann Ra OT, MOT

## 2023-10-27 NOTE — Evaluation (Signed)
Occupational Therapy Evaluation Patient Details Name: [name] MRN: [id number] DOB: 02-17-1933 Today's Date: 10/27/2023   History of Present Illness [name] is a 89 y.o. male with medical history significant of  hypertension, hypothyroidism, lung cancer, chronic respiratory failure with hypoxia on supplemental oxygen at 2 LPM via [place] who presents to the emergency department from [facility] via EMS.  Patient was unable to provide any history possibly due to altered mental status, history was obtained from ED physician and ED medical record.  Per report, patient was found on the floor at the nursing facility without supplemental oxygen on.  Workup in the ED showed increase in creatinine from baseline, IV fluid was given and patient was discharged back to the facility.  Patient returned in the evening due to concern of fever and delirium.  Apparently, patient has been delirious for about 2 days PTA per medical record, patient was reported to be able to maintain a conversation at baseline.  At baseline, he was nonambulatory (reason for placing him at [facility]).  He was reported to have a fever of 103.2 at the facility this evening.  Tylenol was given prior to being taken to the ED for further evaluation and management. (per DO)   Clinical Impression   Pt agreeable to OT and PT co-evaluation. Pt only oriented to self today but able to follow commands. Impulsive at times. Pt required CGA for bed mobility and max A for donning socks in bed. Pt required max A for peri-care with max A to stand at EOB with RW. Max A also for stand pivot to transport chair. B UE 3-/5 shoulder flexion. Pt left in the transport chair with chair alarm set and nurse notified. Lynsey Ange OT, MOT        If plan is discharge home, recommend the following: A lot of help with walking and/or transfers;A lot of help with bathing/dressing/bathroom;Assistance with cooking/housework;Direct  supervision/assist for medications management;Assist for transportation;Help with stairs or ramp for entrance    Functional Status Assessment  Patient has had a recent decline in their functional status and demonstrates the ability to make significant improvements in function in a reasonable and predictable amount of time.  Equipment Recommendations  None recommended by OT           Precautions / Restrictions Precautions Precautions: Fall Restrictions Weight Bearing Restrictions Per Provider Order: No      Mobility Bed Mobility Overal bed mobility: Needs Assistance Bed Mobility: Supine to Sit     Supine to sit: Supervision, Contact guard     General bed mobility comments: mild labored effort    Transfers Overall transfer level: Needs assistance Equipment used: Rolling walker (2 wheels) Transfers: Sit to/from Stand, Bed to chair/wheelchair/BSC Sit to Stand: Max assist Stand pivot transfers: Max assist         General transfer comment: Very poor standing balance; B LE weak and shaking.      Balance Overall balance assessment: Needs assistance Sitting-balance support: No upper extremity supported, Feet supported Sitting balance-Leahy Scale: Poor Sitting balance - Comments: seated at EOB Postural control: Posterior lean Standing balance support: Bilateral upper extremity supported, During functional activity Standing balance-Leahy Scale: Poor Standing balance comment: using RW                           ADL either performed or assessed with clinical judgement   ADL Overall ADL's : Needs assistance/impaired Eating/Feeding: Set up;Sitting  Grooming: Set up;Minimal assistance;Sitting   Upper Body Bathing: Minimal assistance;Sitting;Moderate assistance   Lower Body Bathing: Maximal assistance;Moderate assistance;Sitting/lateral leans   Upper Body Dressing : Minimal assistance;Moderate assistance;Sitting   Lower Body Dressing: Moderate  assistance;Maximal assistance;Bed level Lower Body Dressing Details (indicate cue type and reason): Attempted to don sock but required assist long sitting in bed. Nearly donned sock but could not quite reach his foot. Toilet Transfer: Maximal assistance;Stand-pivot;Rolling walker (2 wheels) Toilet Transfer Details (indicate cue type and reason): Simulated via EOB to transport chair. Toileting- Clothing Manipulation and Hygiene: Maximal assistance;Sitting/lateral lean Toileting - Clothing Manipulation Details (indicate cue type and reason): max A for peri-care by NT while pt was assisted in standing.             Vision Baseline Vision/History: 1 Wears glasses Ability to See in Adequate Light: 1 Impaired Patient Visual Report: Other (comment) (Reports "foggy" vision.) Vision Assessment?: Yes Tracking/Visual Pursuits: Other (comment) (difficulty dissociating head and eye movements for tracking) Additional Comments: will continue to assess.     Perception Perception: Not tested       Praxis Praxis: Not tested       Pertinent Vitals/Pain Pain Assessment Pain Assessment: No/denies pain     Extremity/Trunk Assessment Upper Extremity Assessment Upper Extremity Assessment: Generalized weakness (3-/5 bilaterally for shoulder flexion; able to grasp RW and support himself in bed.)   Lower Extremity Assessment Lower Extremity Assessment: Defer to PT evaluation   Cervical / Trunk Assessment Cervical / Trunk Assessment: Normal   Communication Communication Communication: Difficulty communicating thoughts/reduced clarity of speech (Difficult to understand at times.)   Cognition Arousal: Alert Behavior During Therapy: Impulsive Overall Cognitive Status: No family/caregiver present to determine baseline cognitive functioning Area of Impairment: Orientation, Safety/judgement                 Orientation Level: Person       Safety/Judgement: Decreased awareness of safety                             Home Living Family/patient expects to be discharged to:: Skilled nursing facility                                        Prior Functioning/Environment Prior Level of Function : Needs assist           ADLs (physical): Bathing;Dressing;Toileting Mobility Comments: Pt from SNF and not oriented. Reported ambulation without use of AD. History suggests assist needed. ADLs Comments: From SNF and not oriented today other than to self. Likely assisted at facility. Per chart pt was assisted prior to going to SNF.        OT Problem List: Decreased strength;Decreased range of motion;Decreased activity tolerance;Impaired balance (sitting and/or standing);Decreased safety awareness      OT Treatment/Interventions: Self-care/ADL training;Therapeutic exercise;Therapeutic activities;Patient/family education    OT Goals(Current goals can be found in the care plan section) Acute Rehab OT Goals Patient Stated Goal: improve function OT Goal Formulation: With patient Time For Goal Achievement: 11/10/23 Potential to Achieve Goals: Fair  OT Frequency: Min 2X/week    Co-evaluation PT/OT/SLP Co-Evaluation/Treatment: Yes Reason for Co-Treatment: To address functional/ADL transfers   OT goals addressed during session: ADL's and self-care                       End of  Session Equipment Utilized During Treatment: Gait belt;Rolling walker (2 wheels);Oxygen Nurse Communication: Mobility status  Activity Tolerance: Patient tolerated treatment well Patient left: Other (comment);with call bell/phone within reach;with chair alarm set (in transport chair)  OT Visit Diagnosis: Unsteadiness on feet (R26.81);Muscle weakness (generalized) (M62.81);Other symptoms and signs involving cognitive function                Time: 2841-3244 OT Time Calculation (min): 22 min Charges:  OT General Charges $OT Visit: 1 Visit OT Evaluation $OT Eval Low  Complexity: 1 Low  Meredeth Furber OT, MOT  Danie Chandler 10/27/2023, 9:24 AM

## 2023-10-27 NOTE — Evaluation (Signed)
Physical Therapy Evaluation Patient Details Name: [name] MRN: [id number] DOB: 09/23/1933 Today's Date: 10/27/2023  History of Present Illness  [name] is a 89 y.o. male with medical history significant of  hypertension, hypothyroidism, lung cancer, chronic respiratory failure with hypoxia on supplemental oxygen at 2 LPM via [place] who presents to the emergency department from [facility] via EMS.  Patient was unable to provide any history possibly due to altered mental status, history was obtained from ED physician and ED medical record.  Per report, patient was found on the floor at the nursing facility without supplemental oxygen on.  Workup in the ED showed increase in creatinine from baseline, IV fluid was given and patient was discharged back to the facility.  Patient returned in the evening due to concern of fever and delirium.  Apparently, patient has been delirious for about 2 days PTA per medical record, patient was reported to be able to maintain a conversation at baseline.  At baseline, he was nonambulatory (reason for placing him at [facility]).  He was reported to have a fever of 103.2 at the facility this evening.  Tylenol was given prior to being taken to the ED for further evaluation and management.   Clinical Impression  Patient demonstrates slow labored movement for sitting up at bedside, once seated had frequent posterior leaning, difficulty completing sit to stands and limited to a few slow labored shaky side steps at bedside before having to sit due to BLE weakness and fatigue. Patient tolerated sitting up in chair after therapy - nursing staff aware. Patient will benefit from continued skilled physical therapy in hospital and recommended venue below to increase strength, balance, endurance for safe ADLs and gait.           If plan is discharge home, recommend the following: A lot of help with walking and/or transfers;A lot of help with  bathing/dressing/bathroom;Help with stairs or ramp for entrance;Assistance with cooking/housework   Can travel by private vehicle   No    Equipment Recommendations None recommended by PT  Recommendations for Other Services       Functional Status Assessment Patient has had a recent decline in their functional status and demonstrates the ability to make significant improvements in function in a reasonable and predictable amount of time.     Precautions / Restrictions Precautions Precautions: Fall Restrictions Weight Bearing Restrictions Per Provider Order: No      Mobility  Bed Mobility Overal bed mobility: Needs Assistance Bed Mobility: Supine to Sit     Supine to sit: Supervision, Contact guard     General bed mobility comments: increased time, labored movement    Transfers Overall transfer level: Needs assistance Equipment used: Rolling walker (2 wheels) Transfers: Sit to/from Stand, Bed to chair/wheelchair/BSC Sit to Stand: Max assist   Step pivot transfers: Mod assist, Max assist       General transfer comment: unsteady labored movement    Ambulation/Gait Ambulation/Gait assistance: Max assist Gait Distance (Feet): 4 Feet Assistive device: Rolling walker (2 wheels) Gait Pattern/deviations: Decreased step length - right, Decreased step length - left, Decreased stride length Gait velocity: slow     General Gait Details: limited to a few slow labored unsteady side steps before having to sit due to weakness  Stairs            Wheelchair Mobility     Tilt Bed    Modified Rankin (Stroke Patients Only)       Balance  Overall balance assessment: Needs assistance Sitting-balance support: Feet supported Sitting balance-Leahy Scale: Poor Sitting balance - Comments: seated at EOB Postural control: Posterior lean Standing balance support: Bilateral upper extremity supported, During functional activity, Reliant on assistive device for  balance Standing balance-Leahy Scale: Poor Standing balance comment: using RW                             Pertinent Vitals/Pain Pain Assessment Pain Assessment: No/denies pain    Home Living Family/patient expects to be discharged to:: Private residence Living Arrangements: Alone;Other (Comment) Available Help at Discharge: Family;Available PRN/intermittently Type of Home: House Home Access: Level entry       Home Layout: One level Home Equipment: Agricultural consultant (2 wheels);Cane - quad;Grab bars - tub/shower;BSC/3in1      Prior Function Prior Level of Function : Needs assist       Physical Assist : Mobility (physical);ADLs (physical) Mobility (physical): Transfers ADLs (physical): Bathing;Dressing;Toileting Mobility Comments: household and short distanced community ambulator using quad-cane, does not drive prior to last admission ADLs Comments: Independent with household ADLs, assisted by family for community prior to last admission     Extremity/Trunk Assessment   Upper Extremity Assessment Upper Extremity Assessment: Defer to OT evaluation    Lower Extremity Assessment Lower Extremity Assessment: Generalized weakness    Cervical / Trunk Assessment Cervical / Trunk Assessment: Normal  Communication   Communication Communication: Difficulty communicating thoughts/reduced clarity of speech Cueing Techniques: Verbal cues;Tactile cues  Cognition Arousal: Alert Behavior During Therapy: Impulsive Overall Cognitive Status: No family/caregiver present to determine baseline cognitive functioning Area of Impairment: Orientation, Safety/judgement                 Orientation Level: Person     Following Commands: Follows one step commands consistently Safety/Judgement: Decreased awareness of safety              General Comments      Exercises     Assessment/Plan    PT Assessment Patient needs continued PT services  PT Problem List  Decreased strength;Decreased activity tolerance;Decreased balance;Decreased mobility       PT Treatment Interventions DME instruction;Gait training;Functional mobility training;Therapeutic activities;Therapeutic exercise;Balance training;Patient/family education    PT Goals (Current goals can be found in the Care Plan section)  Acute Rehab PT Goals Patient Stated Goal: return home PT Goal Formulation: With patient Time For Goal Achievement: 11/10/23 Potential to Achieve Goals: Good    Frequency Min 3X/week     Co-evaluation PT/OT/SLP Co-Evaluation/Treatment: Yes Reason for Co-Treatment: To address functional/ADL transfers PT goals addressed during session: Mobility/safety with mobility;Balance;Proper use of DME OT goals addressed during session: ADL's and self-care       AM-PAC PT "6 Clicks" Mobility  Outcome Measure Help needed turning from your back to your side while in a flat bed without using bedrails?: A Lot Help needed moving from lying on your back to sitting on the side of a flat bed without using bedrails?: A Lot Help needed moving to and from a bed to a chair (including a wheelchair)?: A Lot Help needed standing up from a chair using your arms (e.g., wheelchair or bedside chair)?: A Lot Help needed to walk in hospital room?: A Lot Help needed climbing 3-5 steps with a railing? : Total 6 Click Score: 11    End of Session   Activity Tolerance: Patient tolerated treatment well;Patient limited by fatigue Patient left: in chair;with call bell/phone within reach;with  chair alarm set Nurse Communication: Mobility status PT Visit Diagnosis: Unsteadiness on feet (R26.81);Muscle weakness (generalized) (M62.81);Other abnormalities of gait and mobility (R26.89)    Time: 2956-2130 PT Time Calculation (min) (ACUTE ONLY): 22 min   Charges:   PT Evaluation $PT Eval Moderate Complexity: 1 Mod PT Treatments $Therapeutic Activity: 8-22 mins PT General Charges $$ ACUTE PT  VISIT: 1 Visit         12:03 PM, 10/27/23 Ocie Bob, MPT Physical Therapist with [facility] [phone] office [phone] mobile phone

## 2023-10-27 NOTE — Plan of Care (Signed)
  Problem: Acute Rehab PT Goals(only PT should resolve) Goal: Pt Will Go Supine/Side To Sit Outcome: Progressing Flowsheets (Taken 10/27/2023 1204) Pt will go Supine/Side to Sit: with minimal assist Goal: Patient Will Transfer Sit To/From Stand Outcome: Progressing Flowsheets (Taken 10/27/2023 1204) Patient will transfer sit to/from stand: with minimal assist Goal: Pt Will Transfer Bed To Chair/Chair To Bed Outcome: Progressing Flowsheets (Taken 10/27/2023 1204) Pt will Transfer Bed to Chair/Chair to Bed:  with min assist  with mod assist Goal: Pt Will Ambulate Outcome: Progressing Flowsheets (Taken 10/27/2023 1204) Pt will Ambulate:  15 feet  with moderate assist  with rolling walker   12:04 PM, 10/27/23 [name], MPT Physical Therapist with [facility] [phone] office [phone] mobile phone

## 2023-10-28 DIAGNOSIS — J189 Pneumonia, unspecified organism: Secondary | ICD-10-CM | POA: Diagnosis not present

## 2023-10-28 LAB — GLUCOSE, CAPILLARY
Glucose-Capillary: 100 mg/dL — ABNORMAL HIGH (ref 70–99)
Glucose-Capillary: 80 mg/dL (ref 70–99)
Glucose-Capillary: 81 mg/dL (ref 70–99)
Glucose-Capillary: 81 mg/dL (ref 70–99)
Glucose-Capillary: 86 mg/dL (ref 70–99)
Glucose-Capillary: 97 mg/dL (ref 70–99)

## 2023-10-28 LAB — BASIC METABOLIC PANEL
Anion gap: 8 (ref 5–15)
BUN: 74 mg/dL — ABNORMAL HIGH (ref 8–23)
CO2: 21 mmol/L — ABNORMAL LOW (ref 22–32)
Calcium: 8.5 mg/dL — ABNORMAL LOW (ref 8.9–10.3)
Chloride: 113 mmol/L — ABNORMAL HIGH (ref 98–111)
Creatinine, Ser: 2.95 mg/dL — ABNORMAL HIGH (ref 0.61–1.24)
GFR, Estimated: 20 mL/min — ABNORMAL LOW (ref >60)
Glucose, Bld: 89 mg/dL (ref 70–99)
Potassium: 4.4 mmol/L (ref 3.5–5.1)
Sodium: 142 mmol/L (ref 135–145)

## 2023-10-28 LAB — LEGIONELLA PNEUMOPHILA SEROGP 1 UR AG: L. pneumophila Serogp 1 Ur Ag: NEGATIVE

## 2023-10-28 MED ORDER — POLYETHYLENE GLYCOL 3350 17 G PO PACK
17.0000 g | PACK | Freq: Every day | ORAL | Status: DC
  Administered 2023-10-28 – 2023-10-29 (×2): 17 g via ORAL
  Filled 2023-10-28 (×2): qty 1

## 2023-10-28 MED ORDER — LORAZEPAM 2 MG/ML IJ SOLN: 0.5000 mg | INTRAMUSCULAR | Status: DC | PRN

## 2023-10-28 MED ORDER — LORAZEPAM 2 MG/ML IJ SOLN: 0.5000 mg | INTRAMUSCULAR | Status: DC

## 2023-10-28 MED ORDER — DOXYCYCLINE HYCLATE 100 MG PO TABS
100.0000 mg | ORAL_TABLET | Freq: Two times a day (BID) | ORAL | Status: DC
  Administered 2023-10-28 – 2023-10-29 (×2): 100 mg via ORAL
  Filled 2023-10-28 (×2): qty 1

## 2023-10-28 MED ORDER — QUETIAPINE FUMARATE 25 MG PO TABS
12.5000 mg | ORAL_TABLET | Freq: Every day | ORAL | Status: DC
Start: 2023-10-28 — End: 2023-10-29
  Administered 2023-10-28: 12.5 mg via ORAL
  Filled 2023-10-28: qty 1

## 2023-10-28 MED ORDER — IPRATROPIUM-ALBUTEROL 0.5-2.5 (3) MG/3ML IN SOLN
3.0000 mL | RESPIRATORY_TRACT | Status: AC
  Administered 2023-10-28: 3 mL via RESPIRATORY_TRACT
  Filled 2023-10-28: qty 3

## 2023-10-28 MED ORDER — LORAZEPAM 2 MG/ML IJ SOLN
1.0000 mg | INTRAMUSCULAR | Status: AC
  Administered 2023-10-28: 1 mg via INTRAVENOUS
  Filled 2023-10-28: qty 1

## 2023-10-28 MED ORDER — IPRATROPIUM-ALBUTEROL 0.5-2.5 (3) MG/3ML IN SOLN: 3.0000 mL | RESPIRATORY_TRACT | Status: DC | PRN

## 2023-10-28 NOTE — Plan of Care (Signed)
  Problem: Clinical Measurements: Goal: Ability to maintain clinical measurements within normal limits will improve Outcome: Progressing   Problem: Activity: Goal: Risk for activity intolerance will decrease Outcome: Progressing   Problem: Nutrition: Goal: Adequate nutrition will be maintained Outcome: Progressing   Problem: Coping: Goal: Level of anxiety will decrease Outcome: Progressing   Problem: Elimination: Goal: Will not experience complications related to bowel motility Outcome: Progressing   Problem: Skin Integrity: Goal: Risk for impaired skin integrity will decrease Outcome: Progressing   Problem: Safety: Goal: Ability to remain free from injury will improve Outcome: Progressing

## 2023-10-28 NOTE — Care Management Important Message (Signed)
Important Message  Patient Details  Name: [name] MRN: [id number] Date of Birth: 1933-03-23   Important Message Given:  N/A - LOS <3 / Initial given by admissions     Corey Harold 10/28/2023, 10:50 AM

## 2023-10-28 NOTE — Consult Note (Signed)
Subjective:    Consult requested by Dr. Eric [place]  [name] is an 89yo with a history of BPH and urethral stricture that was dilated by Dr. [name] in 10/24.  He was admitted for dehydration.  He is voiding some but his PVR is .  He has been on flomax0.4mg  daily for over 5 years. He has a history of meatal stenosis. He has a history of prostate cancer treated with IMRT in 2016.  Attempt at 16 fr foley placement by the nurses was unsuccessful.   ROS:  Review of Systems  Unable to perform ROS: Patient unresponsive    Allergies  Allergen Reactions   Nsaids Other (See Comments)    Decreased renal function    Past Medical History:  Diagnosis Date   Arthritis    Hypertension    Hyperthyroidism    lung ca 11/2020    Past Surgical History:  Procedure Laterality Date   COLONOSCOPY N/A 09/02/2016   Procedure: COLONOSCOPY;  Surgeon: [name], MD;  Location: [facility];  Service: Endoscopy;  Laterality: N/A;  830   NO PAST SURGERIES     POLYPECTOMY  09/02/2016   Procedure: POLYPECTOMY;  Surgeon: [name], MD;  Location: [facility];  Service: Endoscopy;;    Social History   Socioeconomic History   Marital status: Divorced    Spouse name: Not on file   Number of children: Not on file   Years of education: Not on file   Highest education level: Not on file  Occupational History   Not on file  Tobacco Use   Smoking status: Former    Current packs/day: 0.00    Average packs/day: 1.5 packs/day for 40.0 years (60.0 ttl pk-yrs)    Types: Cigarettes    Start date: 15    Quit date: 2000    Years since quitting: 25.0   Smokeless tobacco: Never  Vaping Use   Vaping status: Never Used  Substance and Sexual Activity   Alcohol use: No   Drug use: No   Sexual activity: Not on file  Other Topics Concern   Not on file  Social History Narrative   Not on file   Social Drivers of Health   Financial Resource Strain: Not on file  Food Insecurity: No  Food Insecurity (10/07/2023)   Hunger Vital Sign    Worried About Running Out of Food in the Last Year: Never true    Ran Out of Food in the Last Year: Never true  Transportation Needs: No Transportation Needs (10/07/2023)   PRAPARE - Administrator, Civil Service (Medical): No    Lack of Transportation (Non-Medical): No  Physical Activity: Not on file  Stress: Not on file  Social Connections: Unknown (10/08/2023)   Social Connection and Isolation Panel [NHANES]    Frequency of Communication with Friends and Family: More than three times a week    Frequency of Social Gatherings with Friends and Family: More than three times a week    Attends Religious Services: Patient unable to answer    Active Member of Clubs or Organizations: No    Attends Banker Meetings: Never    Marital Status: Widowed  Intimate Partner Violence: Not At Risk (10/07/2023)   Humiliation, Afraid, Rape, and Kick questionnaire    Fear of Current or Ex-Partner: No    Emotionally Abused: No    Physically Abused: No    Sexually Abused: No    Family History  Problem Relation Age of Onset   Colon cancer Other     Anti-infectives: Anti-infectives (From admission, onward)    Start     Dose/Rate Route Frequency Ordered Stop   10/28/23 2200  doxycycline (VIBRA-TABS) tablet 100 mg        100 mg Oral Every 12 hours 10/28/23 1108     10/27/23 2300  doxycycline (VIBRAMYCIN) 100 mg in dextrose 5 % 250 mL IVPB        100 mg 125 mL/hr over 120 Minutes Intravenous Every 12 hours 10/27/23 0917 10/28/23 0007   10/27/23 1100  doxycycline (VIBRAMYCIN) 100 mg in sodium chloride 0.9 % 250 mL IVPB  Status:  Discontinued        100 mg 125 mL/hr over 120 Minutes Intravenous Every 12 hours 10/27/23 0916 10/27/23 0918   10/26/23 2300  doxycycline (VIBRAMYCIN) 100 mg in sodium chloride 0.9 % 250 mL IVPB  Status:  Discontinued        100 mg 125 mL/hr over 120 Minutes Intravenous Every 12 hours 10/26/23 0830 10/28/23  1108   10/26/23 2200  cefTRIAXone (ROCEPHIN) 1 g in sodium chloride 0.9 % 100 mL IVPB        1 g 200 mL/hr over 30 Minutes Intravenous Every 24 hours 10/26/23 0830     10/25/23 2300  doxycycline (VIBRAMYCIN) 100 mg in sodium chloride 0.9 % 250 mL IVPB        100 mg 125 mL/hr over 120 Minutes Intravenous  Once 10/25/23 2251 10/26/23 0145   10/25/23 2245  cefTRIAXone (ROCEPHIN) 1 g in sodium chloride 0.9 % 100 mL IVPB        1 g 200 mL/hr over 30 Minutes Intravenous  Once 10/25/23 2237 10/25/23 2324   10/25/23 2245  doxycycline (VIBRA-TABS) tablet 100 mg  Status:  Discontinued        100 mg Oral  Once 10/25/23 2238 10/25/23 2251       Current Facility-Administered Medications  Medication Dose Route Frequency Provider Last Rate Last Admin   acetaminophen (TYLENOL) tablet 650 mg  650 mg Oral Q6H PRN Adefeso, Oladapo, DO       Or   acetaminophen (TYLENOL) suppository 650 mg  650 mg Rectal Q6H PRN Adefeso, Oladapo, DO       amLODipine (NORVASC) tablet 10 mg  10 mg Oral Daily [place], Eric J, DO   10 mg at 10/28/23 1002   buPROPion ER (WELLBUTRIN SR) 12 hr tablet 100 mg  100 mg Oral BID [place], Alvira Philips, DO   100 mg at 10/28/23 1007   cefTRIAXone (ROCEPHIN) 1 g in sodium chloride 0.9 % 100 mL IVPB  1 g Intravenous Q24H Adefeso, Oladapo, DO 200 mL/hr at 10/27/23 2102 1 g at 10/27/23 2102   dextromethorphan-guaiFENesin (MUCINEX DM) 30-600 MG per 12 hr tablet 1 tablet  1 tablet Oral BID Adefeso, Oladapo, DO   1 tablet at 10/28/23 1006   doxycycline (VIBRA-TABS) tablet 100 mg  100 mg Oral Q12H [place], Eric J, DO       fluticasone furoate-vilanterol (BREO ELLIPTA) 200-25 MCG/ACT 1 puff  1 puff Inhalation Daily [place], Alvira Philips, DO   1 puff at 10/28/23 0747   haloperidol lactate (HALDOL) injection 1 mg  1 mg Intravenous Q6H PRN [place], Alvira Philips, DO   1 mg at 10/28/23 0034   heparin injection 5,000 Units  5,000 Units Subcutaneous Q8H Adefeso, Oladapo, DO   5,000 Units at 10/28/23 1514   hydrALAZINE  (APRESOLINE) injection 10  mg  10 mg Intravenous Q6H PRN [place], Eric J, DO   10 mg at 10/26/23 1241   ipratropium-albuterol (DUONEB) 0.5-2.5 (3) MG/3ML nebulizer solution 3 mL  3 mL Nebulization Q4H PRN Mansy, Jan A, MD       labetalol (NORMODYNE) injection 10 mg  10 mg Intravenous Q6H PRN [place], Eric J, DO       levothyroxine (SYNTHROID) tablet 150 mcg  150 mcg Oral Daily [place], Eric J, DO   150 mcg at 10/28/23 0454   LORazepam (ATIVAN) injection 0.5 mg  0.5 mg Intravenous Q4H PRN Mansy, Jan A, MD       melatonin tablet 3 mg  3 mg Oral QHS [place], Alvira Philips, DO   3 mg at 10/27/23 2105   polyethylene glycol (MIRALAX / GLYCOLAX) packet 17 g  17 g Oral Daily [place], Eric J, DO   17 g at 10/28/23 1002   prochlorperazine (COMPAZINE) injection 10 mg  10 mg Intravenous Q6H PRN Adefeso, Oladapo, DO       QUEtiapine (SEROQUEL) tablet 12.5 mg  12.5 mg Oral QHS [place], Eric J, DO       senna-docusate (Senokot-S) tablet 1 tablet  1 tablet Oral BID [place], Eric J, DO   1 tablet at 10/28/23 1002   tamsulosin (FLOMAX) capsule 0.4 mg  0.4 mg Oral Daily [place], Alvira Philips, DO   0.4 mg at 10/28/23 1007   umeclidinium bromide (INCRUSE ELLIPTA) 62.5 MCG/ACT 1 puff  1 puff Inhalation Daily [place], Alvira Philips, DO   1 puff at 10/28/23 0747     Objective: Vital signs in last 24 hours: BP (!) 161/92   Pulse (!) 107   Temp 98.1 F (36.7 C) (Oral)   Resp 20   Ht 6\' 2"  (1.88 m)   Wt 59.8 kg   SpO2 99%   BMI 16.93 kg/m   Intake/Output from previous day: 01/29 0701 - 01/30 0700 In: 1382.2 [P.O.:480; IV Piggyback:902.2] Out: 1583 [Urine:1583] Intake/Output this shift: Total I/O In: 25 [P.O.:25] Out: 300 [Urine:300]   Physical Exam Vitals reviewed.  Constitutional:      Appearance: He is ill-appearing.     Comments: Non-verbal.  Somewhat agitated in mittens.   Genitourinary:    Penis: Normal.      Lab Results:  Results for orders placed or performed during the hospital encounter of 10/25/23 (from  the past 24 hours)  Glucose, capillary     Status: Abnormal   Collection Time: 10/27/23  4:26 PM  Result Value Ref Range   Glucose-Capillary 103 (H) 70 - 99 mg/dL  Glucose, capillary     Status: None   Collection Time: 10/27/23  7:59 PM  Result Value Ref Range   Glucose-Capillary 93 70 - 99 mg/dL  Glucose, capillary     Status: Abnormal   Collection Time: 10/27/23 11:57 PM  Result Value Ref Range   Glucose-Capillary 110 (H) 70 - 99 mg/dL  Glucose, capillary     Status: None   Collection Time: 10/28/23  3:35 AM  Result Value Ref Range   Glucose-Capillary 97 70 - 99 mg/dL  Basic metabolic panel     Status: Abnormal   Collection Time: 10/28/23  5:00 AM  Result Value Ref Range   Sodium 142 135 - 145 mmol/L   Potassium 4.4 3.5 - 5.1 mmol/L   Chloride 113 (H) 98 - 111 mmol/L   CO2 21 (L) 22 - 32 mmol/L   Glucose, Bld 89 70 - 99 mg/dL  BUN 74 (H) 8 - 23 mg/dL   Creatinine, Ser 1.61 (H) 0.61 - 1.24 mg/dL   Calcium 8.5 (L) 8.9 - 10.3 mg/dL   GFR, Estimated 20 (L) >60 mL/min   Anion gap 8 5 - 15  Glucose, capillary     Status: None   Collection Time: 10/28/23  7:37 AM  Result Value Ref Range   Glucose-Capillary 80 70 - 99 mg/dL  Glucose, capillary     Status: Abnormal   Collection Time: 10/28/23 11:08 AM  Result Value Ref Range   Glucose-Capillary 100 (H) 70 - 99 mg/dL  Glucose, capillary     Status: None   Collection Time: 10/28/23  4:09 PM  Result Value Ref Range   Glucose-Capillary 81 70 - 99 mg/dL    BMET Recent Labs    10/27/23 0449 10/28/23 0500  NA 143 142  K 4.5 4.4  CL 111 113*  CO2 23 21*  GLUCOSE 91 89  BUN 85* 74*  CREATININE 2.91* 2.95*  CALCIUM 8.9 8.5*   PT/INR No results for input(s): "LABPROT", "INR" in the last 72 hours. ABG No results for input(s): "PHART", "HCO3" in the last 72 hours.  Invalid input(s): "PCO2", "PO2"  Studies/Results: No results found. PVR 480 Prior note from Dr. [name] reviewed.   Procedure: Foley placement.  He was  prepped with betadine and draped.  Lubricating jelly was instilled.  A 41fr coude foley was passed with some resistance at the bulb but successful positioned in the bladder with clear urine return.  Balloon filled with 10ml.   Assessment/Plan: Urethral stricture with incomplete emptying.    A 14 fr coude foley was placed with moderate resistance but successfully.  Reconsult if needed.         No follow-ups on file.    CC: Dr. Eric [place].      Bjorn Pippin 10/28/2023

## 2023-10-28 NOTE — Progress Notes (Signed)
Patient ID: [name], male   DOB: Apr 04, 1933, 89 y.o.   MRN: [id number] Attempted in and out cath. Can not navigate around anatomy of prostate and acth unsuccessful. Md aware, Urology consult placed.  Lidia Collum, RN

## 2023-10-28 NOTE — Progress Notes (Addendum)
PROGRESS NOTE    [name]  UJW:[id number] DOB: [birth year] DOA: 10/25/2023 PCP: Micael Hampshire, FNP    Brief Narrative:   [name] is a 89 y.o. male with past medical history significant for HTN, hypothyroidism, Stage IV NSCLC, chronic hypoxic respiratory failure on 2 L nasal cannula at baseline, BPH who presented to [facility] ED on 10/25/2023 from [facility] via EMS for confusion after patient was found on the ground out of the bed with his oxygen off.  Patient unable to contribute to HPI given his confusion, most obtained from ED physician and medical record.  Apparently patient has been delirious for roughly 2 days, at baseline able to maintain a conversation.  He is nonambulatory at baseline but reported to have a fever of 103.2 F at the facility evening prior to transfer.  Tylenol was given and patient was transported to [name] ED for further evaluation and management per EMS.  In the ED, temperature 98.3 F, HR 104, RR 22, BP 152/84, SpO2 97% on 3 L nasal cannula.  WBC 7.2, hemoglobin 9.9, platelet count 235.  Sodium 140, potassium 5.0, chloride 106, CO2 25, glucose 77, BUN 100, creatinine 3.02.  AST 20, ALT 25, total bilirubin 0.7.  High-sensitivity troponin 15>14.  Urinalysis with moderate leukocytes, negative nitrite, 21-50 WBCs.  CT head without contrast with mildly motion degraded with no acute intracranial malady.  CT C-spine with no acute traumatic injury identified in the C-spine.  CT chest/abdomen/pelvis with new airspace opacities right lower lobe consistent with pneumonia, moderate right hydroureter and increased left hydroureteronephrosis, no ureteral stone, diffuse bladder wall thickening, constipation with large stool ball in the rectum.  Patient was given IV ceftriaxone, doxycycline for concern of UTI versus pneumonia.  Patient was given Haldol.  TRH consulted for admission for further evaluation management of acute metabolic encephalopathy likely  secondary to infectious process with UTI versus pneumonia.  Assessment & Plan:   Acute metabolic encephalopathy Patient presenting to the ED with confusion per records from SNF, typically able to carry on a conversation at baseline.  Was noted to be confused over the last 2 days with fever reported 103.2 F at the facility.  CT head without contrast with no acute findings.  Urinalysis consistent with UTI and chest CT notable for airspace opacities consistent with pneumonia; also with uremia from AKI likely contributing factor.  Also received Haldol in the ED; likely further contributing factor. -- Supportive care, treatment as below  Community-acquired pneumonia Patient with fever 103.2 F at the facility prior to arrival.  CT chest with new airspace opacities right lower lobe consistent with pneumonia. -- Blood cultures x 2: No growth x 2 days -- Strep pneumo urinary antigen: Negative -- Urine Legionella antigen: Pending -- Doxycycline 100 mg IV every 12 hours -- Ceftriaxone 1 g IV every 24 hours -- Incentive spirometry/flutter valve -- Continue supplemental oxygen maintain SpO2 greater than 92%, on 2 L nasal cannula which is baseline  Urinary tract infection Urinalysis with moderate leukocytes, negative nitrite, 21-50 WBCs. -- Urine culture: Multiple species present -- Continue ceftriaxone as above  Acute renal failure on CKD stage IV Obstructive uropathy, Hx BPH BUN elevated 101 with creatinine 3.09 on admission (baseline creatinine at 2.5-2.7).  CT abdomen/pelvis notable for moderate right hydroureter with increased left hydroureteronephrosis and diffuse bladder wall thickening.  After self forwarding on 1/29 bladder scan shows continued retained urine of 517 mL. -- Cr 3.09>2.97>3.33>2.91>2.95 -- Tamsulosin 0.4 mg p.o. daily --  Continue IVF w/ D% 1/2 NS at 75 mL/h  Hyperkalemia: Resolved Potassium 5.4 admission, likely secondary to acute renal failure as above.  Received Lokelma  and IV fluid hydration with resolution of elevated potassium. -- Continue IV fluid hydration -- Repeat BMP in a.m.  Dysphagia Seen by speech therapy. -- Dysphagia 2 diet (fine chop), thin liquids -- Aspiration precautions  Essential hypertension -- Amlodipine 10 mg p.o. daily -- Labetalol/hydralazine PRN SBP >165  Hypothyroidism -- Levothyroxine 150 mcg p.o. daily  Chronic hypoxic respiratory failure COPD, not in acute exacerbation -- Breo Ellipta 1 puff daily (substituted for home Advair) -- Continue Spiriva -- Albuterol neb every 4 hours as needed wheezing/shortness of breath -- Continue supplemental oxygen, maintain SpO2 greater than 92%, on 2 L which is at baseline  Chronic diastolic congestive heart failure, compensated -- Hold home furosemide for now -- Strict I's and O's and daily weights  Stage IV non small cell lung cancer Follows with medical oncology outpatient, Dr. Shirline Frees.  S/p chemo radiation followed by immunotherapy.  Currently on observation.  Dysphagia Dysphagia 2 (fine chop);Thin liquid Liquids provided via: Cup;Straw Medication Administration: Crushed with puree Supervision: Patient able to self feed Compensations: Slow rate;Small sips/bites Postural Changes and/or Swallow Maneuvers: Seated upright 90 degrees  Dementia -- Delirium precautions -- Get up during the day -- Encourage a familiar face to remain present throughout the day -- Keep blinds open and lights on during daylight hours -- Minimize the use of opioids/benzodiazepines -- Melatonin 3 mg p.o. nightly -- Seroquel 12.5 mg p.o. nightly  Adult failure to thrive Generalized weakness/debility/deconditioning/gait disturbance: Seen by palliative care during most recent hospitalization, DNR.  At baseline nonambulatory.  From [facility] SNF.  If patient does not improve considerably back to his baseline with treatment as above, may need to consider transitioning to a more comfort/palliative  approach. -- TOC following   DVT prophylaxis: heparin injection 5,000 Units Start: 10/26/23 0600 SCDs Start: 10/26/23 0453    Code Status: Limited: Do not attempt resuscitation (DNR) -DNR-LIMITED -Do Not Intubate/DNI  Family Communication: No family present at bedside this morning, updated daughters present at bedside yesterday afternoon.  Disposition Plan:  Level of care: Med-Surg Status is: Inpatient Remains inpatient appropriate because: IV antibiotics; awaiting insurance authorization for return to SNF    Consultants:  None  Procedures:  None  Antimicrobials:  Doxycycline 1/27>> Ceftriaxone 1/27>>   Subjective: Patient seen examined bedside, lying in bed.  Nursing staff assisting with breakfast this morning.  Patient remains confused, slow response to questions.  No family present this morning.  Denies chest pain, no shortness of breath, no abdominal pain.  Slightly agitated overnight received Haldol, otherwise no acute concerns overnight per nursing staff.  Objective: Vitals:   10/27/23 1248 10/27/23 2049 10/28/23 0317 10/28/23 0747  BP: (!) 151/87 129/63 (!) 155/80   Pulse: 100 99 98   Resp: 16 19 19    Temp: 98.2 F (36.8 C) 98.3 F (36.8 C) 98.2 F (36.8 C)   TempSrc: Oral Oral Axillary   SpO2: 91% 93% 99% 99%  Weight:   59.8 kg   Height:        Intake/Output Summary (Last 24 hours) at 10/28/2023 1308 Last data filed at 10/28/2023 1030 Gross per 24 hour  Intake 1167.21 ml  Output 1233 ml  Net -65.79 ml   Filed Weights   10/25/23 1937 10/27/23 0411 10/28/23 0317  Weight: 57 kg 60.2 kg 59.8 kg    Examination:  Physical Exam:  GEN: NAD, alert, pleasantly confused HEENT: NCAT, PERRL, EOMI, sclera clear, dry mucous membranes PULM: Breath sounds slight diminished bilateral bases, no wheezes/crackles, normal respiratory effort without accessory muscle use, on 2 L nasal cannula which is at his baseline. CV: RRR w/o M/G/R GI: abd soft, NTND, + BS MSK: no  peripheral edema, moves all extremities independently NEURO: No focal neurological deficits Integumentary: Noted vitiligo, otherwise no other concerning rashes/lesions/wounds noted on exposed skin surfaces    Data Reviewed: I have personally reviewed following labs and imaging studies  CBC: Recent Labs  Lab 10/25/23 0759 10/25/23 2012 10/26/23 0518 10/27/23 0449  WBC 7.2 7.5 7.4 5.8  NEUTROABS 6.1 5.8  --   --   HGB 9.9* 9.0* 10.2* 9.1*  HCT 32.1* 28.4* 33.8* 29.1*  MCV 93.0 91.9 98.5 93.3  PLT 235 233 181 204   Basic Metabolic Panel: Recent Labs  Lab 10/25/23 2012 10/26/23 0518 10/26/23 1249 10/27/23 0449 10/28/23 0500  NA 140 140 142 143 142  K 5.2* 5.4* 4.9 4.5 4.4  CL 108 109 110 111 113*  CO2 23 20* 22 23 21*  GLUCOSE 91 59* 106* 91 89  BUN 101* 97* 100* 85* 74*  CREATININE 3.09* 2.97* 3.33* 2.91* 2.95*  CALCIUM 8.7* 8.5* 8.9 8.9 8.5*  MG 2.3 2.5*  --  2.3  --   PHOS  --  3.6  --  3.1  --    GFR: Estimated Creatinine Clearance: 14.6 mL/min (A) (by C-G formula based on SCr of 2.95 mg/dL (H)). Liver Function Tests: Recent Labs  Lab 10/25/23 0759 10/25/23 2012 10/26/23 0518 10/27/23 0449  AST 20 22 25 25   ALT 25 25 24 23   ALKPHOS 73 66 66 65  BILITOT 0.7 0.8 0.9 0.7  PROT 7.8 7.4 7.2 7.6  ALBUMIN 3.3* 3.2* 3.1* 3.1*   No results for input(s): "LIPASE", "AMYLASE" in the last 168 hours. No results for input(s): "AMMONIA" in the last 168 hours. Coagulation Profile: No results for input(s): "INR", "PROTIME" in the last 168 hours. Cardiac Enzymes: No results for input(s): "CKTOTAL", "CKMB", "CKMBINDEX", "TROPONINI" in the last 168 hours. BNP (last 3 results) No results for input(s): "PROBNP" in the last 8760 hours. HbA1C: No results for input(s): "HGBA1C" in the last 72 hours. CBG: Recent Labs  Lab 10/27/23 1959 10/27/23 2357 10/28/23 0335 10/28/23 0737 10/28/23 1108  GLUCAP 93 110* 97 80 100*   Lipid Profile: No results for input(s): "CHOL",  "HDL", "LDLCALC", "TRIG", "CHOLHDL", "LDLDIRECT" in the last 72 hours. Thyroid Function Tests: No results for input(s): "TSH", "T4TOTAL", "FREET4", "T3FREE", "THYROIDAB" in the last 72 hours. Anemia Panel: No results for input(s): "VITAMINB12", "FOLATE", "FERRITIN", "TIBC", "IRON", "RETICCTPCT" in the last 72 hours. Sepsis Labs: Recent Labs  Lab 10/25/23 2013  LATICACIDVEN 1.0    Recent Results (from the past 240 hours)  Urine Culture     Status: Abnormal   Collection Time: 10/25/23  7:47 AM   Specimen: Urine, Random  Result Value Ref Range Status   Specimen Description   Final    URINE, RANDOM Performed at [facility], [address], [place], [place] [address]    Special Requests   Final    NONE Reflexed from [phone] [id number] Performed at [facility], [address], [place], [place] [address]    Culture MULTIPLE SPECIES PRESENT, SUGGEST RECOLLECTION (A)  Final   Report Status 10/26/2023 FINAL  Final  Blood Culture (routine x 2)     Status: None (Preliminary result)  Collection Time: 10/25/23  8:13 PM   Specimen: BLOOD  Result Value Ref Range Status   Specimen Description BLOOD BLOOD RIGHT FOREARM  Final   Special Requests   Final    BOTTLES DRAWN AEROBIC AND ANAEROBIC Blood Culture adequate volume   Culture   Final    NO GROWTH 3 DAYS Performed at [facility], [address], [place], [place] [address]    Report Status PENDING  Incomplete  Blood Culture (routine x 2)     Status: None (Preliminary result)   Collection Time: 10/25/23  8:36 PM   Specimen: BLOOD  Result Value Ref Range Status   Specimen Description BLOOD BLOOD RIGHT HAND  Final   Special Requests   Final    BOTTLES DRAWN AEROBIC AND ANAEROBIC Blood Culture adequate volume   Culture   Final    NO GROWTH 3 DAYS Performed at [facility], [address] [place], [place] [address]    Report Status PENDING  Incomplete         Radiology Studies: No results found.       Scheduled Meds:  amLODipine  10  mg Oral Daily   buPROPion ER  100 mg Oral BID   dextromethorphan-guaiFENesin  1 tablet Oral BID   doxycycline  100 mg Oral Q12H   fluticasone furoate-vilanterol  1 puff Inhalation Daily   heparin  5,000 Units Subcutaneous Q8H   levothyroxine  150 mcg Oral Daily   melatonin  3 mg Oral QHS   polyethylene glycol  17 g Oral Daily   senna-docusate  1 tablet Oral BID   tamsulosin  0.4 mg Oral Daily   umeclidinium bromide  1 puff Inhalation Daily   Continuous Infusions:  cefTRIAXone (ROCEPHIN)  IV 1 g (10/27/23 2102)     LOS: 3 days    Time spent: 52 minutes spent on chart review, discussion with nursing staff, consultants, updating family and interview/physical exam; more than 50% of that time was spent in counseling and/or coordination of care.    Alvira Philips [place], DO [facility] Available via Epic secure chat 7am-7pm After these hours, please refer to coverage provider listed on [email or web address] 10/28/2023, 1:08 PM

## 2023-10-28 NOTE — Progress Notes (Signed)
Pt growing increasingly agitated and trying to pull oxygen and Tele monitor off, pt removed left arm IV and new IV placed, and 1mg  of Haldol administered. Pt is still trying to climb out of bed and take Oxygen off. Pt O2 sats 88 on 3Ls.

## 2023-10-29 DIAGNOSIS — Z8546 Personal history of malignant neoplasm of prostate: Secondary | ICD-10-CM | POA: Diagnosis not present

## 2023-10-29 DIAGNOSIS — R339 Retention of urine, unspecified: Secondary | ICD-10-CM | POA: Diagnosis not present

## 2023-10-29 DIAGNOSIS — R488 Other symbolic dysfunctions: Secondary | ICD-10-CM | POA: Diagnosis not present

## 2023-10-29 DIAGNOSIS — N139 Obstructive and reflux uropathy, unspecified: Secondary | ICD-10-CM | POA: Diagnosis not present

## 2023-10-29 DIAGNOSIS — E039 Hypothyroidism, unspecified: Secondary | ICD-10-CM | POA: Diagnosis not present

## 2023-10-29 DIAGNOSIS — Y846 Urinary catheterization as the cause of abnormal reaction of the patient, or of later complication, without mention of misadventure at the time of the procedure: Secondary | ICD-10-CM | POA: Diagnosis present

## 2023-10-29 DIAGNOSIS — I509 Heart failure, unspecified: Secondary | ICD-10-CM | POA: Diagnosis not present

## 2023-10-29 DIAGNOSIS — Z79899 Other long term (current) drug therapy: Secondary | ICD-10-CM | POA: Diagnosis not present

## 2023-10-29 DIAGNOSIS — I5032 Chronic diastolic (congestive) heart failure: Secondary | ICD-10-CM | POA: Diagnosis not present

## 2023-10-29 DIAGNOSIS — C3412 Malignant neoplasm of upper lobe, left bronchus or lung: Secondary | ICD-10-CM | POA: Diagnosis not present

## 2023-10-29 DIAGNOSIS — M6281 Muscle weakness (generalized): Secondary | ICD-10-CM | POA: Diagnosis not present

## 2023-10-29 DIAGNOSIS — N281 Cyst of kidney, acquired: Secondary | ICD-10-CM | POA: Diagnosis present

## 2023-10-29 DIAGNOSIS — N189 Chronic kidney disease, unspecified: Secondary | ICD-10-CM | POA: Diagnosis not present

## 2023-10-29 DIAGNOSIS — R296 Repeated falls: Secondary | ICD-10-CM | POA: Diagnosis present

## 2023-10-29 DIAGNOSIS — G9341 Metabolic encephalopathy: Secondary | ICD-10-CM | POA: Diagnosis not present

## 2023-10-29 DIAGNOSIS — Z79890 Hormone replacement therapy (postmenopausal): Secondary | ICD-10-CM | POA: Diagnosis not present

## 2023-10-29 DIAGNOSIS — I110 Hypertensive heart disease with heart failure: Secondary | ICD-10-CM | POA: Diagnosis not present

## 2023-10-29 DIAGNOSIS — C3411 Malignant neoplasm of upper lobe, right bronchus or lung: Secondary | ICD-10-CM | POA: Diagnosis not present

## 2023-10-29 DIAGNOSIS — J9611 Chronic respiratory failure with hypoxia: Secondary | ICD-10-CM | POA: Diagnosis not present

## 2023-10-29 DIAGNOSIS — J984 Other disorders of lung: Secondary | ICD-10-CM | POA: Diagnosis not present

## 2023-10-29 DIAGNOSIS — F419 Anxiety disorder, unspecified: Secondary | ICD-10-CM | POA: Diagnosis present

## 2023-10-29 DIAGNOSIS — Z9981 Dependence on supplemental oxygen: Secondary | ICD-10-CM | POA: Diagnosis not present

## 2023-10-29 DIAGNOSIS — R627 Adult failure to thrive: Secondary | ICD-10-CM | POA: Diagnosis not present

## 2023-10-29 DIAGNOSIS — Z87891 Personal history of nicotine dependence: Secondary | ICD-10-CM | POA: Diagnosis not present

## 2023-10-29 DIAGNOSIS — I700 Atherosclerosis of aorta: Secondary | ICD-10-CM | POA: Diagnosis not present

## 2023-10-29 DIAGNOSIS — D6489 Other specified anemias: Secondary | ICD-10-CM | POA: Diagnosis not present

## 2023-10-29 DIAGNOSIS — J189 Pneumonia, unspecified organism: Secondary | ICD-10-CM | POA: Diagnosis not present

## 2023-10-29 DIAGNOSIS — N3001 Acute cystitis with hematuria: Secondary | ICD-10-CM | POA: Diagnosis not present

## 2023-10-29 DIAGNOSIS — F32A Depression, unspecified: Secondary | ICD-10-CM | POA: Diagnosis present

## 2023-10-29 DIAGNOSIS — I6782 Cerebral ischemia: Secondary | ICD-10-CM | POA: Diagnosis not present

## 2023-10-29 DIAGNOSIS — I130 Hypertensive heart and chronic kidney disease with heart failure and stage 1 through stage 4 chronic kidney disease, or unspecified chronic kidney disease: Secondary | ICD-10-CM | POA: Diagnosis present

## 2023-10-29 DIAGNOSIS — T83518A Infection and inflammatory reaction due to other urinary catheter, initial encounter: Secondary | ICD-10-CM | POA: Diagnosis present

## 2023-10-29 DIAGNOSIS — R498 Other voice and resonance disorders: Secondary | ICD-10-CM | POA: Diagnosis not present

## 2023-10-29 DIAGNOSIS — J9601 Acute respiratory failure with hypoxia: Secondary | ICD-10-CM | POA: Diagnosis not present

## 2023-10-29 DIAGNOSIS — F0152 Vascular dementia, unspecified severity, with psychotic disturbance: Secondary | ICD-10-CM | POA: Diagnosis not present

## 2023-10-29 DIAGNOSIS — R636 Underweight: Secondary | ICD-10-CM | POA: Diagnosis not present

## 2023-10-29 DIAGNOSIS — Z886 Allergy status to analgesic agent status: Secondary | ICD-10-CM | POA: Diagnosis not present

## 2023-10-29 DIAGNOSIS — R1310 Dysphagia, unspecified: Secondary | ICD-10-CM | POA: Diagnosis not present

## 2023-10-29 DIAGNOSIS — G894 Chronic pain syndrome: Secondary | ICD-10-CM | POA: Diagnosis not present

## 2023-10-29 DIAGNOSIS — N390 Urinary tract infection, site not specified: Secondary | ICD-10-CM | POA: Diagnosis not present

## 2023-10-29 DIAGNOSIS — J449 Chronic obstructive pulmonary disease, unspecified: Secondary | ICD-10-CM | POA: Diagnosis not present

## 2023-10-29 DIAGNOSIS — I10 Essential (primary) hypertension: Secondary | ICD-10-CM | POA: Diagnosis not present

## 2023-10-29 DIAGNOSIS — N35811 Other urethral stricture, male, meatal: Secondary | ICD-10-CM | POA: Diagnosis not present

## 2023-10-29 DIAGNOSIS — I129 Hypertensive chronic kidney disease with stage 1 through stage 4 chronic kidney disease, or unspecified chronic kidney disease: Secondary | ICD-10-CM | POA: Diagnosis not present

## 2023-10-29 DIAGNOSIS — N401 Enlarged prostate with lower urinary tract symptoms: Secondary | ICD-10-CM | POA: Diagnosis present

## 2023-10-29 DIAGNOSIS — N184 Chronic kidney disease, stage 4 (severe): Secondary | ICD-10-CM | POA: Diagnosis not present

## 2023-10-29 DIAGNOSIS — Z7951 Long term (current) use of inhaled steroids: Secondary | ICD-10-CM | POA: Diagnosis not present

## 2023-10-29 DIAGNOSIS — D631 Anemia in chronic kidney disease: Secondary | ICD-10-CM | POA: Diagnosis present

## 2023-10-29 DIAGNOSIS — E875 Hyperkalemia: Secondary | ICD-10-CM | POA: Diagnosis not present

## 2023-10-29 DIAGNOSIS — R2689 Other abnormalities of gait and mobility: Secondary | ICD-10-CM | POA: Diagnosis not present

## 2023-10-29 DIAGNOSIS — R0602 Shortness of breath: Secondary | ICD-10-CM | POA: Diagnosis not present

## 2023-10-29 DIAGNOSIS — J9811 Atelectasis: Secondary | ICD-10-CM | POA: Diagnosis not present

## 2023-10-29 DIAGNOSIS — D638 Anemia in other chronic diseases classified elsewhere: Secondary | ICD-10-CM | POA: Diagnosis not present

## 2023-10-29 DIAGNOSIS — F4321 Adjustment disorder with depressed mood: Secondary | ICD-10-CM | POA: Diagnosis not present

## 2023-10-29 DIAGNOSIS — R531 Weakness: Secondary | ICD-10-CM | POA: Diagnosis not present

## 2023-10-29 DIAGNOSIS — Q791 Other congenital malformations of diaphragm: Secondary | ICD-10-CM | POA: Diagnosis not present

## 2023-10-29 DIAGNOSIS — R0902 Hypoxemia: Secondary | ICD-10-CM | POA: Diagnosis not present

## 2023-10-29 DIAGNOSIS — R262 Difficulty in walking, not elsewhere classified: Secondary | ICD-10-CM | POA: Diagnosis not present

## 2023-10-29 DIAGNOSIS — Z8719 Personal history of other diseases of the digestive system: Secondary | ICD-10-CM | POA: Diagnosis not present

## 2023-10-29 DIAGNOSIS — G9340 Encephalopathy, unspecified: Secondary | ICD-10-CM | POA: Diagnosis not present

## 2023-10-29 DIAGNOSIS — J441 Chronic obstructive pulmonary disease with (acute) exacerbation: Secondary | ICD-10-CM | POA: Diagnosis not present

## 2023-10-29 DIAGNOSIS — R918 Other nonspecific abnormal finding of lung field: Secondary | ICD-10-CM | POA: Diagnosis not present

## 2023-10-29 DIAGNOSIS — R4182 Altered mental status, unspecified: Secondary | ICD-10-CM | POA: Diagnosis not present

## 2023-10-29 DIAGNOSIS — Z66 Do not resuscitate: Secondary | ICD-10-CM | POA: Diagnosis present

## 2023-10-29 DIAGNOSIS — N179 Acute kidney failure, unspecified: Secondary | ICD-10-CM | POA: Diagnosis not present

## 2023-10-29 DIAGNOSIS — E43 Unspecified severe protein-calorie malnutrition: Secondary | ICD-10-CM | POA: Diagnosis not present

## 2023-10-29 DIAGNOSIS — I4510 Unspecified right bundle-branch block: Secondary | ICD-10-CM | POA: Diagnosis present

## 2023-10-29 DIAGNOSIS — R1312 Dysphagia, oropharyngeal phase: Secondary | ICD-10-CM | POA: Diagnosis not present

## 2023-10-29 LAB — GLUCOSE, CAPILLARY
Glucose-Capillary: 72 mg/dL (ref 70–99)
Glucose-Capillary: 71 mg/dL (ref 70–99)

## 2023-10-29 LAB — BASIC METABOLIC PANEL
Anion gap: 10 (ref 5–15)
BUN: 62 mg/dL — ABNORMAL HIGH (ref 8–23)
CO2: 21 mmol/L — ABNORMAL LOW (ref 22–32)
Calcium: 8.8 mg/dL — ABNORMAL LOW (ref 8.9–10.3)
Chloride: 115 mmol/L — ABNORMAL HIGH (ref 98–111)
Creatinine, Ser: 2.66 mg/dL — ABNORMAL HIGH (ref 0.61–1.24)
GFR, Estimated: 22 mL/min — ABNORMAL LOW (ref >60)
Glucose, Bld: 71 mg/dL (ref 70–99)
Potassium: 3.8 mmol/L (ref 3.5–5.1)
Sodium: 146 mmol/L — ABNORMAL HIGH (ref 135–145)

## 2023-10-29 MED ORDER — DOXYCYCLINE HYCLATE 100 MG PO TABS: 100.0000 mg | ORAL_TABLET | Freq: Two times a day (BID) | ORAL | Status: DC

## 2023-10-29 MED ORDER — POLYETHYLENE GLYCOL 3350 17 G PO PACK: 17.0000 g | PACK | Freq: Every day | ORAL | Status: AC

## 2023-10-29 MED ORDER — LORAZEPAM 0.5 MG PO TABS: 0.5000 mg | ORAL_TABLET | Freq: Three times a day (TID) | ORAL | 0 refills | Status: DC | PRN

## 2023-10-29 MED ORDER — SODIUM CHLORIDE 0.9 % IV SOLN: 1.0000 g | INTRAVENOUS | Status: DC

## 2023-10-29 MED ORDER — SENNOSIDES-DOCUSATE SODIUM 8.6-50 MG PO TABS: 1.0000 | ORAL_TABLET | Freq: Two times a day (BID) | ORAL | Status: DC

## 2023-10-29 MED ORDER — QUETIAPINE FUMARATE 25 MG PO TABS: 12.5000 mg | ORAL_TABLET | Freq: Every day | ORAL | 0 refills | Status: DC

## 2023-10-29 MED ORDER — DOXYCYCLINE HYCLATE 100 MG PO TABS: 100.0000 mg | ORAL_TABLET | Freq: Two times a day (BID) | ORAL | Status: AC

## 2023-10-29 MED ORDER — MELATONIN 3 MG PO TABS: 3.0000 mg | ORAL_TABLET | Freq: Every day | ORAL | Status: DC

## 2023-10-29 NOTE — Plan of Care (Signed)
  Problem: Clinical Measurements: Goal: Will remain free from infection Outcome: Progressing Goal: Respiratory complications will improve Outcome: Progressing   Problem: Activity: Goal: Risk for activity intolerance will decrease Outcome: Progressing   Problem: Coping: Goal: Level of anxiety will decrease Outcome: Progressing   Problem: Safety: Goal: Ability to remain free from injury will improve Outcome: Progressing   Problem: Skin Integrity: Goal: Risk for impaired skin integrity will decrease Outcome: Progressing

## 2023-10-29 NOTE — Discharge Summary (Signed)
Physician Discharge Summary  THANIEL COLUCCIO WUJ:[id number] DOB: [birth year] DOA: 10/25/2023  PCP: Micael Hampshire, FNP  Admit date: 10/25/2023 Discharge date: 10/29/2023  Admitted From: [facility] Disposition: [facility]  Recommendations for Outpatient Follow-up:  Follow up with PCP in 1-2 weeks Follow-up with urology, Dr. [name] for further management of Foley catheter, consideration of voiding trial Started on Seroquel 12.5 mg p.o. nightly for management of his underlying dementia with behavioral disturbance If no significant improvement in mental status, physical activity may need to consider referral to hospice in the near future given his advanced age, comorbidities and dementia  Equipment/Devices: Foley catheter  Discharge Condition: Stable CODE STATUS: DNR  Diet recommendation: Heart healthy diet Dysphagia 2 (fine chop);Thin liquid Liquids provided via: Cup;Straw Medication Administration: Crushed with puree Supervision: Patient able to self feed Compensations: Slow rate;Small sips/bites Postural Changes and/or Swallow Maneuvers: Seated upright 90 degrees   History of present illness:  [name] is a 89 y.o. male with past medical history significant for HTN, hypothyroidism, Stage IV NSCLC, chronic hypoxic respiratory failure on 2 L nasal cannula at baseline, BPH who presented to [facility] ED on 10/25/2023 from [facility] via EMS for confusion after patient was found on the ground out of the bed with his oxygen off.  Patient unable to contribute to HPI given his confusion, most obtained from ED physician and medical record.  Apparently patient has been delirious for roughly 2 days, at baseline able to maintain a conversation.  He is nonambulatory at baseline but reported to have a fever of 103.2 F at the facility evening prior to transfer.  Tylenol was given and patient was transported to [name] ED for further evaluation and management per  EMS.   In the ED, temperature 98.3 F, HR 104, RR 22, BP 152/84, SpO2 97% on 3 L nasal cannula.  WBC 7.2, hemoglobin 9.9, platelet count 235.  Sodium 140, potassium 5.0, chloride 106, CO2 25, glucose 77, BUN 100, creatinine 3.02.  AST 20, ALT 25, total bilirubin 0.7.  High-sensitivity troponin 15>14.  Urinalysis with moderate leukocytes, negative nitrite, 21-50 WBCs.  CT head without contrast with mildly motion degraded with no acute intracranial malady.  CT C-spine with no acute traumatic injury identified in the C-spine.  CT chest/abdomen/pelvis with new airspace opacities right lower lobe consistent with pneumonia, moderate right hydroureter and increased left hydroureteronephrosis, no ureteral stone, diffuse bladder wall thickening, constipation with large stool ball in the rectum.  Patient was given IV ceftriaxone, doxycycline for concern of UTI versus pneumonia.  Patient was given Haldol.  TRH consulted for admission for further evaluation management of acute metabolic encephalopathy likely secondary to infectious process with UTI versus pneumonia.  Hospital course:  Acute metabolic encephalopathy Patient presenting to the ED with confusion per records from SNF, typically able to carry on a conversation at baseline.  Was noted to be confused over the last 2 days with fever reported 103.2 F at the facility.  CT head without contrast with no acute findings.  Urinalysis consistent with UTI and chest CT notable for airspace opacities consistent with pneumonia; also with uremia from AKI likely contributing factor.  Also received Haldol in the ED; likely further contributing factor.  Mental status now appears back at baseline.  Consider hospice evaluation outpatient.   Community-acquired pneumonia Patient with fever 103.2 F at the facility prior to arrival.  CT chest with new airspace opacities right lower lobe consistent with pneumonia.  Patient completed  course of doxycycline, ceftriaxone.  Strep  pneumo urine antigen negative.  Remains on his 2 L nasal cannula.   Urinary tract infection Urinalysis with moderate leukocytes, negative nitrite, 21-50 WBCs.Urine culture: Multiple species present.  Treated with course of ceftriaxone as above.   Acute renal failure on CKD stage IV Obstructive uropathy, Hx BPH BUN elevated 101 with creatinine 3.09 on admission (baseline creatinine at 2.5-2.7).  CT abdomen/pelvis notable for moderate right hydroureter with increased left hydroureteronephrosis and diffuse bladder wall thickening.  After self forwarding on 1/29 bladder scan shows continued retained urine of 517 mL.  Foley catheter placed by urology, Dr. Annabell Howells on 10/28/2023.  Continue tamsulosin 0.4 mg p.o. daily.  Creatinine improved to 2.66 at time of discharge.  Outpatient follow-up with urology.   Hyperkalemia: Resolved Potassium 5.4 admission, likely secondary to acute renal failure as above.  Received Lokelma and IV fluid hydration with resolution of elevated potassium.  Potassium 3.8 at time of discharge.   Dysphagia Seen by speech therapy. Dysphagia 2 diet (fine chop), thin liquids. Aspiration precautions   Essential hypertension Amlodipine 10 mg p.o. daily   Hypothyroidism Levothyroxine 150 mcg p.o. daily   Chronic hypoxic respiratory failure COPD, not in acute exacerbation Continue Advair and Spiriva.  Continues on his home oxygen, 2 L per nasal cannula.   Chronic diastolic congestive heart failure, compensated Continue furosemide.   Stage IV non small cell lung cancer Follows with medical oncology outpatient, Dr. Shirline Frees.  S/p chemo radiation followed by immunotherapy.  Currently on observation.   Dysphagia Dysphagia 2 (fine chop);Thin liquid Liquids provided via: Cup;Straw Medication Administration: Crushed with puree Supervision: Patient able to self feed Compensations: Slow rate;Small sips/bites Postural Changes and/or Swallow Maneuvers: Seated upright 90 degrees    Dementia -- Delirium precautions -- Get up during the day -- Encourage a familiar face to remain present throughout the day -- Keep blinds open and lights on during daylight hours -- Minimize the use of opioids/benzodiazepines -- Melatonin 3 mg p.o. nightly -- Seroquel 12.5 mg p.o. nightly   Adult failure to thrive Generalized weakness/debility/deconditioning/gait disturbance: Seen by palliative care during most recent hospitalization, DNR.  At baseline nonambulatory.  From [facility] SN[facility].  Would recommend palliative care/hospice to follow on discharge    Discharge Diagnoses:  Principal Problem:   CAP (community acquired pneumonia) Active Problems:   COPD (chronic obstructive pulmonary disease) (HCC)   Chronic diastolic CHF (congestive heart failure) (HCC)   Acquired hypothyroidism   Underweight   Benign prostatic hyperplasia   Acute metabolic encephalopathy   Acute kidney injury superimposed on stage 4 chronic kidney disease (HCC)   Prolonged QT interval   Failure to thrive in adult   UTI (urinary tract infection)   Hyperkalemia   Hypermagnesemia    Discharge Instructions  Discharge Instructions     Call MD for:  difficulty breathing, headache or visual disturbances   Complete by: As directed    Call MD for:  extreme fatigue   Complete by: As directed    Call MD for:  persistant dizziness or light-headedness   Complete by: As directed    Call MD for:  persistant nausea and vomiting   Complete by: As directed    Call MD for:  severe uncontrolled pain   Complete by: As directed    Call MD for:  temperature >100.4   Complete by: As directed    Diet - low sodium heart healthy   Complete by: As directed    Increase  activity slowly   Complete by: As directed       Allergies as of 10/29/2023       Reactions   Nsaids Other (See Comments)   Decreased renal function        Medication List     PAUSE taking these medications    furosemide 40 MG  tablet Wait to take this until your doctor or other care provider tells you to start again. Commonly known as: LASIX Take 1 tablet (40 mg total) by mouth daily.       STOP taking these medications    cefTRIAXone 1 g injection Commonly known as: ROCEPHIN   tadalafil 5 MG tablet Commonly known as: CIALIS       TAKE these medications    acetaminophen 650 MG CR tablet Commonly known as: TYLENOL Take 650 mg by mouth every 6 (six) hours as needed for pain.   amLODipine 10 MG tablet Commonly known as: NORVASC Take 1 tablet (10 mg total) by mouth daily. Hold for blood pressure reading <130   buPROPion ER 100 MG 12 hr tablet Commonly known as: WELLBUTRIN SR Take 100 mg by mouth 2 (two) times daily.   Calcium Carbonate Antacid 200 MG Tbdp Take 200 mg by mouth daily.   doxycycline 100 MG tablet Commonly known as: VIBRA-TABS Take 1 tablet (100 mg total) by mouth every 12 (twelve) hours for 1 dose.   ferrous sulfate 325 (65 FE) MG EC tablet Take 1 tablet (325 mg total) by mouth 3 (three) times a week. What changed: when to take this   fluticasone-salmeterol 250-50 MCG/ACT Aepb Commonly known as: ADVAIR Inhale 1 puff into the lungs in the morning and at bedtime.   gabapentin 250 MG/5ML solution Commonly known as: NEURONTIN Take 3 mLs (150 mg total) by mouth at bedtime.   Incruse Ellipta 62.5 MCG/ACT Aepb Generic drug: umeclidinium bromide Inhale 1 puff into the lungs daily.   labetalol 200 MG tablet Commonly known as: NORMODYNE Take 1 tablet (200 mg total) by mouth 2 (two) times daily. Hold for blood pressure reading <130   levothyroxine 150 MCG tablet Commonly known as: SYNTHROID Take 1 tablet (150 mcg total) by mouth daily.   LORazepam 0.5 MG tablet Commonly known as: ATIVAN Take 1 tablet (0.5 mg total) by mouth every 8 (eight) hours as needed (agitation).   melatonin 3 MG Tabs tablet Take 1 tablet (3 mg total) by mouth at bedtime.   polyethylene glycol 17 g  packet Commonly known as: MIRALAX / GLYCOLAX Take 17 g by mouth daily. Start taking on: October 30, 2023   ProSource Liqd Take 30 mLs by mouth 3 (three) times daily. What changed: Another medication with the same name was removed. Continue taking this medication, and follow the directions you see here.   QUEtiapine 25 MG tablet Commonly known as: SEROQUEL Take 0.5 tablets (12.5 mg total) by mouth at bedtime.   senna-docusate 8.6-50 MG tablet Commonly known as: Senokot-S Take 1 tablet by mouth 2 (two) times daily.   tamsulosin 0.4 MG Caps capsule Commonly known as: FLOMAX Take 1 capsule (0.4 mg total) by mouth daily.        Follow-up Information     Micael Hampshire, FNP. Schedule an appointment as soon as possible for a visit in 1 week(s).   Specialty: Nurse Practitioner Contact information: [address] [name] [address] [phone]         [name] Mardene Celeste, MD. Schedule an appointment as  soon as possible for a visit.   Specialty: Urology Why: Consideration of voiding trial, management of Foley catheter Contact information: [address] [place] [address] [phone]                Allergies  Allergen Reactions   Nsaids Other (See Comments)    Decreased renal function    Consultations: Urology, Dr. Annabell Howells   Procedures/Studies: CT CHEST ABDOMEN PELVIS WO CONTRAST Result Date: 10/25/2023 CLINICAL DATA:  Fever.  Delirious.  Sepsis. EXAM: CT CHEST, ABDOMEN AND PELVIS WITHOUT CONTRAST TECHNIQUE: Multidetector CT imaging of the chest, abdomen and pelvis was performed following the standard protocol without IV contrast. RADIATION DOSE REDUCTION: This exam was performed according to the departmental dose-optimization program which includes automated exposure control, adjustment of the mA and/or kV according to patient size and/or use of iterative reconstruction technique. COMPARISON:  Same day chest radiograph; PET/CT  08/31/2023; CT chest 07/07/2023; CT chest abdomen and pelvis 04/29/2023 FINDINGS: CT CHEST FINDINGS Cardiovascular: Small pericardial effusion. Advanced aortic atherosclerotic calcification. Normal heart size. Mediastinum/Nodes: No thoracic adenopathy noting limitations of noncontrast exam and respiratory motion artifact. Trachea and esophagus are unremarkable. Lungs/Pleura: Respiratory motion obscures detail. Emphysema with bullous change in the right apex. Unchanged right upper lobe volume loss, architectural distortion, and traction bronchiectasis, presumably radiation induced. New airspace opacities in the right lower lobe posteriorly may be due to atelectasis or pneumonia. No pleural effusion or pneumothorax. Musculoskeletal: No acute fracture. Unchanged absence of a portion of the third anterolateral right rib. CT ABDOMEN PELVIS FINDINGS Hepatobiliary: Unremarkable noncontrast appearance of the liver, gallbladder, and biliary tree. Pancreas: Unremarkable. Spleen: No acute abnormality. Adrenals/Urinary Tract: Stable adrenal glands. Since PET/CT 08/31/2023 there is new moderate hydroureter in the proximal ureter. The distal ureter is not well evaluated. Increased marked left hydroureteronephrosis. No ureteral stone. Diffuse bladder wall thickening. Stomach/Bowel: Stomach is within normal limits. Paucity of intra-abdominal fat, lack of IV contrast, and motion artifact limits assessment of the bowel wall. No evidence of obstruction. Large colonic stool burden and stool ball in the rectum. Vascular/Lymphatic: Advanced aortic atherosclerotic calcification. No definite lymphadenopathy. Reproductive: Fiducial markers along the prostate. Other: No free intraperitoneal fluid or air. Musculoskeletal: No acute fracture. IMPRESSION: 1. New airspace opacities in the right lower lobe posteriorly may be due to atelectasis or pneumonia. 2. Since PET/CT 08/31/2023 there is new moderate right hydroureter and increased left  hydroureteronephrosis. No ureteral stone. Diffuse bladder wall thickening. Findings may be due to cystitis and ascending urinary tract infection. 3. Constipation with large stool ball in the rectum. Correlate for fecal impaction. Aortic Atherosclerosis (ICD10-I70.0) and Emphysema (ICD10-J43.9). Electronically Signed   By: Minerva Fester M.D.   On: 10/25/2023 21:35   CT Head Wo Contrast Result Date: 10/25/2023 CLINICAL DATA:  Mental status change.  Fever. EXAM: CT HEAD WITHOUT CONTRAST TECHNIQUE: Contiguous axial images were obtained from the base of the skull through the vertex without intravenous contrast. RADIATION DOSE REDUCTION: This exam was performed according to the departmental dose-optimization program which includes automated exposure control, adjustment of the mA and/or kV according to patient size and/or use of iterative reconstruction technique. COMPARISON:  Head CT 10/25/2023.  MRI brain 01/02/2021. FINDINGS: Brain: No evidence of acute infarction, hemorrhage, hydrocephalus, extra-axial collection or mass lesion/mass effect. Again seen is mild diffuse atrophy and moderate periventricular white matter hypodensity, likely chronic small vessel ischemic change. Vascular: Atherosclerotic calcifications are present within the cavernous internal carotid arteries. Skull: Normal. Negative for fracture or focal  lesion. Sinuses/Orbits: No acute finding. Other: None. IMPRESSION: 1. No acute intracranial process. 2. Stable atrophy and chronic small vessel ischemic changes. Electronically Signed   By: Darliss Cheney M.D.   On: 10/25/2023 21:16   DG Chest Port 1 View Result Date: 10/25/2023 CLINICAL DATA:  Questionable sepsis EXAM: PORTABLE CHEST 1 VIEW COMPARISON:  Chest x-ray 10/25/2023.  CT of the chest 07/07/2023. FINDINGS: Parenchymal opacity in the right upper lobe is unchanged. Emphysema again noted. No new lung infiltrate or pleural effusion. No pneumothorax. The cardiomediastinal silhouette appears  stable. IMPRESSION: 1. Parenchymal opacity in the right upper lobe is unchanged. 2. No new lung infiltrate or pleural effusion. Electronically Signed   By: Darliss Cheney M.D.   On: 10/25/2023 20:48   CT Cervical Spine Wo Contrast Result Date: 10/25/2023 CLINICAL DATA:  89 year old male status post fall from bed. Found down. EXAM: CT CERVICAL SPINE WITHOUT CONTRAST TECHNIQUE: Multidetector CT imaging of the cervical spine was performed without intravenous contrast. Multiplanar CT image reconstructions were also generated. RADIATION DOSE REDUCTION: This exam was performed according to the departmental dose-optimization program which includes automated exposure control, adjustment of the mA and/or kV according to patient size and/or use of iterative reconstruction technique. COMPARISON:  Head CT today.  No prior cervical spine CT. FINDINGS: Alignment: Maintained cervical lordosis. Cervicothoracic junction alignment is within normal limits. Bilateral posterior element alignment is within normal limits. Skull base and vertebrae: Mild motion artifact, less than on the head CT today. Visualized skull base is intact. No atlanto-occipital dissociation. C1 and C2 appear intact and aligned. Heterogeneous bone mineralization although stable compared to chest CT 07/07/2023. No acute osseous abnormality identified. Soft tissues and spinal canal: No prevertebral fluid or swelling. No visible canal hematoma. Calcified cervical carotid atherosclerosis. Disc levels: Generally mild for age, age-appropriate cervical spine degeneration. Up to mild associated cervical spinal stenosis. Upper chest: Emphysema and architectural distortion in the lung apices. Visible upper thoracic levels appear intact. IMPRESSION: 1. No acute traumatic injury identified in the cervical spine. 2. Generally mild for age cervical spine degeneration. 3.  Emphysema (ICD10-J43.9). Electronically Signed   By: Odessa Fleming M.D.   On: 10/25/2023 08:55   CT Head Wo  Contrast Result Date: 10/25/2023 CLINICAL DATA:  89 year old male status post fall from bed. Found down. EXAM: CT HEAD WITHOUT CONTRAST TECHNIQUE: Contiguous axial images were obtained from the base of the skull through the vertex without intravenous contrast. RADIATION DOSE REDUCTION: This exam was performed according to the departmental dose-optimization program which includes automated exposure control, adjustment of the mA and/or kV according to patient size and/or use of iterative reconstruction technique. COMPARISON:  Brain MRI 01/02/2021.  Head CT 10/07/2023. FINDINGS: Mildly motion degraded exam today. Brain: Stable cerebral volume. No midline shift, ventriculomegaly, mass effect, evidence of mass lesion, intracranial hemorrhage or evidence of cortically based acute infarction. Stable gray-white matter differentiation throughout the brain. Patchy bilateral periventricular is stable. Vascular: Calcified atherosclerosis at the skull base. No suspicious intracranial vascular hyperdensity. Skull: Stable, intact. Sinuses/Orbits: Visualized paranasal sinuses and mastoids are stable and well aerated. Other: Postoperative changes to both globes. No acute orbit or scalp soft tissue injury is identified. IMPRESSION: Mildly motion degraded exam with No acute intracranial abnormality or acute traumatic injury identified. Electronically Signed   By: Odessa Fleming M.D.   On: 10/25/2023 08:48   DG Chest Portable 1 View Result Date: 10/25/2023 CLINICAL DATA:  89 year old male with history of altered mental status. EXAM: PORTABLE CHEST 1 VIEW COMPARISON:  Chest x-ray 10/07/2023. FINDINGS: Chronic mass-like area of architectural distortion in the periphery of the right upper lobe with overlying of architectural distortion in the adjacent ribs, similar to the prior study, most compatible with an area of chronic postradiation mass-like fibrosis. Lungs otherwise appear clear. No pleural effusions. No pneumothorax. No evidence of  pulmonary edema. Heart size is normal. Upper mediastinal contours are within normal limits. Atherosclerotic calcifications are noted in the thoracic aorta. IMPRESSION: 1. No radiographic evidence of acute cardiopulmonary disease. 2. Chronic postradiation changes in the right lung, similar to prior studies. Electronically Signed   By: Trudie Reed M.D.   On: 10/25/2023 08:05   CT HEAD WO CONTRAST Result Date: 10/07/2023 CLINICAL DATA:  Mental status change, unknown cause EXAM: CT HEAD WITHOUT CONTRAST TECHNIQUE: Contiguous axial images were obtained from the base of the skull through the vertex without intravenous contrast. RADIATION DOSE REDUCTION: This exam was performed according to the departmental dose-optimization program which includes automated exposure control, adjustment of the mA and/or kV according to patient size and/or use of iterative reconstruction technique. COMPARISON:  Brain MR 01/02/21 FINDINGS: Brain: No hemorrhage. No hydrocephalus. No extra-axial fluid collection. No CT evidence of an acute cortical infarct. No mass effect. No mass lesion. There is a background of mild chronic microvascular ischemic change. Vascular: No hyperdense vessel or unexpected calcification. Skull: Normal. Negative for fracture or focal lesion. Sinuses/Orbits: No middle ear or mastoid effusion. Paranasal sinuses clear. Orbits are unremarkable. Other: None. IMPRESSION: No CT etiology for altered mental status identified. Electronically Signed   By: Lorenza Cambridge M.D.   On: 10/07/2023 16:15   DG Chest Port 1 View Result Date: 10/07/2023 CLINICAL DATA:  Altered mental status EXAM: PORTABLE CHEST 1 VIEW COMPARISON:  07/07/2023, PET CT 08/31/2023, chest x-ray 12/12/2020, CT 01/27/2023 FINDINGS: Emphysema. Stable bandlike scarring in the right upper lung with distortion along the right upper lobe pleuroparenchymal surface and consistent with scarring. Right hilar fullness and distortion, no change and presumably due to  post therapeutic change, no findings suspicious for recurrence on interval PET CT 08/31/2023. stable cardiomediastinal silhouette with aortic atherosclerosis. No pneumothorax IMPRESSION: No active disease. Emphysema with right upper lobe/parahilar scarring and distortion as seen on prior exam. Electronically Signed   By: Jasmine Pang M.D.   On: 10/07/2023 15:56     Subjective: Patient seen examined bedside, resting calmly.  Lying in bed.  Eating breakfast.  NT present.  Discharging back to SNF today.  Remains pleasantly confused.  Denies headache, no fever, no chest pain, no shortness breath, no abdominal pain.  No acute events overnight per nursing staff.  Discharge Exam: Vitals:   10/29/23 0830 10/29/23 1449  BP:  (!) 150/80  Pulse:    Resp:  14  Temp:  98.7 F (37.1 C)  SpO2: 93% 92%   Vitals:   10/29/23 0418 10/29/23 0423 10/29/23 0830 10/29/23 1449  BP: (!) 160/86   (!) 150/80  Pulse: 93     Resp: 17   14  Temp: 97.7 F (36.5 C)   98.7 F (37.1 C)  TempSrc: Oral   Oral  SpO2: 100%  93% 92%  Weight:  57.4 kg    Height:        Physical Exam: GEN: NAD, alert, pleasantly confused HEENT: NCAT, PERRL, EOMI, sclera clear, dry mucous membranes PULM: Breath sounds slight diminished bilateral bases, no wheezes/crackles, normal respiratory effort without accessory muscle use, on 2 L nasal cannula which is at his baseline. CV: RRR  w/o M/G/R GI: abd soft, NTND, + BS GU: Foley catheter noted with clear yellow urine in collection bag. MSK: no peripheral edema, moves all extremities independently NEURO: No focal neurological deficits Integumentary: Noted vitiligo, otherwise no other concerning rashes/lesions/wounds noted on exposed skin surfaces    The results of significant diagnostics from this hospitalization (including imaging, microbiology, ancillary and laboratory) are listed below for reference.     Microbiology: Recent Results (from the past 240 hours)  Urine Culture      Status: Abnormal   Collection Time: 10/25/23  7:47 AM   Specimen: Urine, Random  Result Value Ref Range Status   Specimen Description   Final    URINE, RANDOM Performed at [facility], [address] [place], [place] [address]    Special Requests   Final    NONE Reflexed from [id number] Performed at [facility], [address], [place], [place] [address]    Culture MULTIPLE SPECIES PRESENT, SUGGEST RECOLLECTION (A)  Final   Report Status 10/26/2023 FINAL  Final  Blood Culture (routine x 2)     Status: None (Preliminary result)   Collection Time: 10/25/23  8:13 PM   Specimen: BLOOD  Result Value Ref Range Status   Specimen Description BLOOD BLOOD RIGHT FOREARM  Final   Special Requests   Final    BOTTLES DRAWN AEROBIC AND ANAEROBIC Blood Culture adequate volume   Culture   Final    NO GROWTH 4 DAYS Performed at [facility], [address], [place], [place] [address]    Report Status PENDING  Incomplete  Blood Culture (routine x 2)     Status: None (Preliminary result)   Collection Time: 10/25/23  8:36 PM   Specimen: BLOOD  Result Value Ref Range Status   Specimen Description BLOOD BLOOD RIGHT HAND  Final   Special Requests   Final    BOTTLES DRAWN AEROBIC AND ANAEROBIC Blood Culture adequate volume   Culture   Final    NO GROWTH 4 DAYS Performed at [facility], [address] [place], [place] [address]    Report Status PENDING  Incomplete     Labs: BNP (last 3 results) Recent Labs    07/07/23 1818  BNP 175.0*   Basic Metabolic Panel: Recent Labs  Lab 10/25/23 2012 10/26/23 0518 10/26/23 1249 10/27/23 0449 10/28/23 0500 10/29/23 0503  NA 140 140 142 143 142 146*  K 5.2* 5.4* 4.9 4.5 4.4 3.8  CL 108 109 110 111 113* 115*  CO2 23 20* 22 23 21* 21*  GLUCOSE 91 59* 106* 91 89 71  BUN 101* 97* 100* 85* 74* 62*  CREATININE 3.09* 2.97* 3.33* 2.91* 2.95* 2.66*  CALCIUM 8.7* 8.5* 8.9 8.9 8.5* 8.8*  MG 2.3 2.5*  --  2.3  --   --   PHOS  --  3.6  --  3.1  --   --     Liver Function Tests: Recent Labs  Lab 10/25/23 0759 10/25/23 2012 10/26/23 0518 10/27/23 0449  AST 20 22 25 25   ALT 25 25 24 23   ALKPHOS 73 66 66 65  BILITOT 0.7 0.8 0.9 0.7  PROT 7.8 7.4 7.2 7.6  ALBUMIN 3.3* 3.2* 3.1* 3.1*   No results for input(s): "LIPASE", "AMYLASE" in the last 168 hours. No results for input(s): "AMMONIA" in the last 168 hours. CBC: Recent Labs  Lab 10/25/23 0759 10/25/23 2012 10/26/23 0518 10/27/23 0449  WBC 7.2 7.5 7.4 5.8  NEUTROABS 6.1 5.8  --   --  HGB 9.9* 9.0* 10.2* 9.1*  HCT 32.1* 28.4* 33.8* 29.1*  MCV 93.0 91.9 98.5 93.3  PLT 235 233 181 204   Cardiac Enzymes: No results for input(s): "CKTOTAL", "CKMB", "CKMBINDEX", "TROPONINI" in the last 168 hours. BNP: Invalid input(s): "POCBNP" CBG: Recent Labs  Lab 10/28/23 1609 10/28/23 2019 10/28/23 2345 10/29/23 0419 10/29/23 0751  GLUCAP 81 81 86 72 71   D-Dimer No results for input(s): "DDIMER" in the last 72 hours. Hgb A1c No results for input(s): "HGBA1C" in the last 72 hours. Lipid Profile No results for input(s): "CHOL", "HDL", "LDLCALC", "TRIG", "CHOLHDL", "LDLDIRECT" in the last 72 hours. Thyroid function studies No results for input(s): "TSH", "T4TOTAL", "T3FREE", "THYROIDAB" in the last 72 hours.  Invalid input(s): "FREET3" Anemia work up No results for input(s): "VITAMINB12", "FOLATE", "FERRITIN", "TIBC", "IRON", "RETICCTPCT" in the last 72 hours. Urinalysis    Component Value Date/Time   COLORURINE STRAW (A) 10/25/2023 0747   APPEARANCEUR CLEAR 10/25/2023 0747   APPEARANCEUR Cloudy (A) 04/07/2023 1149   LABSPEC 1.011 10/25/2023 0747   PHURINE 6.0 10/25/2023 0747   GLUCOSEU NEGATIVE 10/25/2023 0747   HGBUR NEGATIVE 10/25/2023 0747   BILIRUBINUR NEGATIVE 10/25/2023 0747   BILIRUBINUR Negative 04/07/2023 1149   KETONESUR NEGATIVE 10/25/2023 0747   PROTEINUR NEGATIVE 10/25/2023 0747   UROBILINOGEN 0.2 05/16/2009 0857   NITRITE NEGATIVE 10/25/2023 0747    LEUKOCYTESUR MODERATE (A) 10/25/2023 0747   Sepsis Labs Recent Labs  Lab 10/25/23 0759 10/25/23 2012 10/26/23 0518 10/27/23 0449  WBC 7.2 7.5 7.4 5.8   Microbiology Recent Results (from the past 240 hours)  Urine Culture     Status: Abnormal   Collection Time: 10/25/23  7:47 AM   Specimen: Urine, Random  Result Value Ref Range Status   Specimen Description   Final    URINE, RANDOM Performed at [facility], [address] [place], [place] [address]    Special Requests   Final    NONE Reflexed from [id number] Performed at [facility], [address], [place], [place] [address]    Culture MULTIPLE SPECIES PRESENT, SUGGEST RECOLLECTION (A)  Final   Report Status 10/26/2023 FINAL  Final  Blood Culture (routine x 2)     Status: None (Preliminary result)   Collection Time: 10/25/23  8:13 PM   Specimen: BLOOD  Result Value Ref Range Status   Specimen Description BLOOD BLOOD RIGHT FOREARM  Final   Special Requests   Final    BOTTLES DRAWN AEROBIC AND ANAEROBIC Blood Culture adequate volume   Culture   Final    NO GROWTH 4 DAYS Performed at [facility], [address], [place], [place] [address]    Report Status PENDING  Incomplete  Blood Culture (routine x 2)     Status: None (Preliminary result)   Collection Time: 10/25/23  8:36 PM   Specimen: BLOOD  Result Value Ref Range Status   Specimen Description BLOOD BLOOD RIGHT HAND  Final   Special Requests   Final    BOTTLES DRAWN AEROBIC AND ANAEROBIC Blood Culture adequate volume   Culture   Final    NO GROWTH 4 DAYS Performed at [facility], [address] [place], [place] [address]    Report Status PENDING  Incomplete     Time coordinating discharge: Over 30 minutes  SIGNED:   Alvira Philips [place], DO  [facility] 10/29/2023, 3:29 PM.

## 2023-10-29 NOTE — Care Management Important Message (Signed)
Important Message  Patient Details  Name: [name] MRN: [id number] Date of Birth: 25-Nov-1932   Important Message Given:  Yes - Medicare IM     Corey Harold 10/29/2023, 11:39 AM

## 2023-10-29 NOTE — TOC Transition Note (Signed)
Transition of Care [facility]) - Discharge Note   Patient Details  Name: [name] MRN: [id number] Date of Birth: 1932/12/24  Transition of Care [facility]) CM/SW Contact:  Elliot Gault, LCSW Phone Number: 10/29/2023, 3:28 PM   Clinical Narrative:     Pt medically stable for dc and we have insurance auth for SNF. Updated Kerri at [facility] and they can admit pt today.  Updated pt's daughter who remains in agreement with the dc plan.  DC clinical to be sent electronically. RN to call report.  No other TOC needs for dc.  Final next level of care: Skilled Nursing Facility Barriers to Discharge: Barriers Resolved   Patient Goals and CMS Choice Patient states their goals for this hospitalization and ongoing recovery are:: rehab CMS Medicare.gov Compare Post Acute Care list provided to:: Patient Represenative (must comment) Choice offered to / list presented to : Adult Children [place] ownership interest in [facility].provided to:: Adult Children    Discharge Placement                       Discharge Plan and Services Additional resources added to the After Visit Summary for   In-house Referral: Clinical Social Work   Post Acute Care Choice: Skilled Nursing Facility                               Social Drivers of Health (SDOH) Interventions SDOH Screenings   Food Insecurity: No Food Insecurity (10/07/2023)  Housing: Low Risk  (10/07/2023)  Transportation Needs: No Transportation Needs (10/07/2023)  Utilities: Not At Risk (10/07/2023)  Depression (PHQ2-9): Low Risk  (02/23/2023)  Social Connections: Unknown (10/08/2023)  Tobacco Use: Medium Risk (10/25/2023)     Readmission Risk Interventions    10/26/2023   11:45 AM 10/08/2023   12:45 PM  Readmission Risk Prevention Plan  Transportation Screening Complete Complete  Medication Review (RN Care Manager)  Complete  PCP or Specialist appointment within 3-5 days of discharge Complete   HRI or Home Care  Consult Complete Complete  SW Recovery Care/Counseling Consult Complete Complete  Palliative Care Screening Not Applicable Not Applicable  Skilled Nursing Facility Complete Complete

## 2023-10-29 NOTE — Progress Notes (Signed)
PROGRESS NOTE    [name]  ZOX:[id number] DOB: 23-Mar-1933 DOA: 10/25/2023 PCP: Micael Hampshire, FNP    Brief Narrative:   [name] is a 89 y.o. male with past medical history significant for HTN, hypothyroidism, Stage IV NSCLC, chronic hypoxic respiratory failure on 2 L nasal cannula at baseline, BPH who presented to [facility] ED on 10/25/2023 from [facility] via EMS for confusion after patient was found on the ground out of the bed with his oxygen off.  Patient unable to contribute to HPI given his confusion, most obtained from ED physician and medical record.  Apparently patient has been delirious for roughly 2 days, at baseline able to maintain a conversation.  He is nonambulatory at baseline but reported to have a fever of 103.2 F at the facility evening prior to transfer.  Tylenol was given and patient was transported to [name] ED for further evaluation and management per EMS.  In the ED, temperature 98.3 F, HR 104, RR 22, BP 152/84, SpO2 97% on 3 L nasal cannula.  WBC 7.2, hemoglobin 9.9, platelet count 235.  Sodium 140, potassium 5.0, chloride 106, CO2 25, glucose 77, BUN 100, creatinine 3.02.  AST 20, ALT 25, total bilirubin 0.7.  High-sensitivity troponin 15>14.  Urinalysis with moderate leukocytes, negative nitrite, 21-50 WBCs.  CT head without contrast with mildly motion degraded with no acute intracranial malady.  CT C-spine with no acute traumatic injury identified in the C-spine.  CT chest/abdomen/pelvis with new airspace opacities right lower lobe consistent with pneumonia, moderate right hydroureter and increased left hydroureteronephrosis, no ureteral stone, diffuse bladder wall thickening, constipation with large stool ball in the rectum.  Patient was given IV ceftriaxone, doxycycline for concern of UTI versus pneumonia.  Patient was given Haldol.  TRH consulted for admission for further evaluation management of acute metabolic encephalopathy likely  secondary to infectious process with UTI versus pneumonia.  Assessment & Plan:   Acute metabolic encephalopathy Patient presenting to the ED with confusion per records from SNF, typically able to carry on a conversation at baseline.  Was noted to be confused over the last 2 days with fever reported 103.2 F at the facility.  CT head without contrast with no acute findings.  Urinalysis consistent with UTI and chest CT notable for airspace opacities consistent with pneumonia; also with uremia from AKI likely contributing factor.  Also received Haldol in the ED; likely further contributing factor. -- Supportive care, treatment as below  Community-acquired pneumonia Patient with fever 103.2 F at the facility prior to arrival.  CT chest with new airspace opacities right lower lobe consistent with pneumonia. -- Blood cultures x 2: No growth x 2 days -- Strep pneumo urinary antigen: Negative -- Urine Legionella antigen: Pending -- Doxycycline 100 mg IV every 12 hours -- Ceftriaxone 1 g IV every 24 hours -- Incentive spirometry/flutter valve -- Continue supplemental oxygen maintain SpO2 greater than 92%, on 2 L nasal cannula which is baseline  Urinary tract infection Urinalysis with moderate leukocytes, negative nitrite, 21-50 WBCs. -- Urine culture: Multiple species present -- Continue ceftriaxone as above  Acute renal failure on CKD stage IV Obstructive uropathy, Hx BPH BUN elevated 101 with creatinine 3.09 on admission (baseline creatinine at 2.5-2.7).  CT abdomen/pelvis notable for moderate right hydroureter with increased left hydroureteronephrosis and diffuse bladder wall thickening.  After self forwarding on 1/29 bladder scan shows continued retained urine of 517 mL. -- Cr 3.09>2.97>3.33>2.91>2.95>2.66 -- Tamsulosin 0.4 mg p.o. daily --  Foley catheter placed by urology, Dr. Annabell Howells on 10/28/2023 -- Outpatient follow-up with urology  Hyperkalemia: Resolved Potassium 5.4 admission, likely  secondary to acute renal failure as above.  Received Lokelma and IV fluid hydration with resolution of elevated potassium. -- Repeat BMP in a.m.  Dysphagia Seen by speech therapy. -- Dysphagia 2 diet (fine chop), thin liquids -- Aspiration precautions  Essential hypertension -- Amlodipine 10 mg p.o. daily -- Labetalol/hydralazine PRN SBP >165  Hypothyroidism -- Levothyroxine 150 mcg p.o. daily  Chronic hypoxic respiratory failure COPD, not in acute exacerbation -- Breo Ellipta 1 puff daily (substituted for home Advair) -- Continue Spiriva -- Albuterol neb every 4 hours as needed wheezing/shortness of breath -- Continue supplemental oxygen, maintain SpO2 greater than 92%, on 2 L which is at baseline  Chronic diastolic congestive heart failure, compensated -- Hold home furosemide for now -- Strict I's and O's and daily weights  Stage IV non small cell lung cancer Follows with medical oncology outpatient, Dr. Shirline Frees.  S/p chemo radiation followed by immunotherapy.  Currently on observation.  Dysphagia Dysphagia 2 (fine chop);Thin liquid Liquids provided via: Cup;Straw Medication Administration: Crushed with puree Supervision: Patient able to self feed Compensations: Slow rate;Small sips/bites Postural Changes and/or Swallow Maneuvers: Seated upright 90 degrees  Dementia -- Delirium precautions -- Get up during the day -- Encourage a familiar face to remain present throughout the day -- Keep blinds open and lights on during daylight hours -- Minimize the use of opioids/benzodiazepines -- Melatonin 3 mg p.o. nightly -- Seroquel 12.5 mg p.o. nightly  Adult failure to thrive Generalized weakness/debility/deconditioning/gait disturbance: Seen by palliative care during most recent hospitalization, DNR.  At baseline nonambulatory.  From [facility] SNF.  -- TOC following; pending insurance authorization for return to SNF -- Would recommend palliative care/hospice to follow  on discharge   DVT prophylaxis: heparin injection 5,000 Units Start: 10/26/23 0600 SCDs Start: 10/26/23 0453    Code Status: Limited: Do not attempt resuscitation (DNR) -DNR-LIMITED -Do Not Intubate/DNI  Family Communication: No family present at bedside this morning  Disposition Plan:  Level of care: Med-Surg Status is: Inpatient Remains inpatient appropriate because: IV antibiotics; awaiting insurance authorization for return to SNF    Consultants:  None  Procedures:  None  Antimicrobials:  Doxycycline 1/27>> Ceftriaxone 1/27>>   Subjective: Patient seen examined bedside, lying in bed.  Eating breakfast.  NT present at bedside.  No family present this morning.  Remains pleasantly confused; although overall confusion improved following Foley catheter placement yesterday with improved uremia.  Denies chest pain, no shortness of breath, no abdominal pain.  No acute concerns overnight per nursing staff.  Medically stable for discharge back to [facility] once insurance authorization received.  Objective: Vitals:   10/28/23 1339 10/28/23 2016 10/29/23 0418 10/29/23 0423  BP: (!) 161/92 (!) 160/86 (!) 160/86   Pulse: (!) 107 95 93   Resp: 20 15 17    Temp: 98.1 F (36.7 C) 99 F (37.2 C) 97.7 F (36.5 C)   TempSrc: Oral Oral Oral   SpO2:  100% 100%   Weight:    57.4 kg  Height:        Intake/Output Summary (Last 24 hours) at 10/29/2023 1049 Last data filed at 10/29/2023 0930 Gross per 24 hour  Intake 700 ml  Output 3200 ml  Net -2500 ml   Filed Weights   10/27/23 0411 10/28/23 0317 10/29/23 0423  Weight: 60.2 kg 59.8 kg 57.4 kg    Examination:  Physical Exam: GEN: NAD, alert, pleasantly confused HEENT: NCAT, PERRL, EOMI, sclera clear, dry mucous membranes PULM: Breath sounds slight diminished bilateral bases, no wheezes/crackles, normal respiratory effort without accessory muscle use, on 2 L nasal cannula which is at his baseline. CV: RRR w/o M/G/R GI:  abd soft, NTND, + BS GU: Foley catheter noted with clear yellow urine in collection bag. MSK: no peripheral edema, moves all extremities independently NEURO: No focal neurological deficits Integumentary: Noted vitiligo, otherwise no other concerning rashes/lesions/wounds noted on exposed skin surfaces    Data Reviewed: I have personally reviewed following labs and imaging studies  CBC: Recent Labs  Lab 10/25/23 0759 10/25/23 2012 10/26/23 0518 10/27/23 0449  WBC 7.2 7.5 7.4 5.8  NEUTROABS 6.1 5.8  --   --   HGB 9.9* 9.0* 10.2* 9.1*  HCT 32.1* 28.4* 33.8* 29.1*  MCV 93.0 91.9 98.5 93.3  PLT 235 233 181 204   Basic Metabolic Panel: Recent Labs  Lab 10/25/23 2012 10/26/23 0518 10/26/23 1249 10/27/23 0449 10/28/23 0500 10/29/23 0503  NA 140 140 142 143 142 146*  K 5.2* 5.4* 4.9 4.5 4.4 3.8  CL 108 109 110 111 113* 115*  CO2 23 20* 22 23 21* 21*  GLUCOSE 91 59* 106* 91 89 71  BUN 101* 97* 100* 85* 74* 62*  CREATININE 3.09* 2.97* 3.33* 2.91* 2.95* 2.66*  CALCIUM 8.7* 8.5* 8.9 8.9 8.5* 8.8*  MG 2.3 2.5*  --  2.3  --   --   PHOS  --  3.6  --  3.1  --   --    GFR: Estimated Creatinine Clearance: 15.6 mL/min (A) (by C-G formula based on SCr of 2.66 mg/dL (H)). Liver Function Tests: Recent Labs  Lab 10/25/23 0759 10/25/23 2012 10/26/23 0518 10/27/23 0449  AST 20 22 25 25   ALT 25 25 24 23   ALKPHOS 73 66 66 65  BILITOT 0.7 0.8 0.9 0.7  PROT 7.8 7.4 7.2 7.6  ALBUMIN 3.3* 3.2* 3.1* 3.1*   No results for input(s): "LIPASE", "AMYLASE" in the last 168 hours. No results for input(s): "AMMONIA" in the last 168 hours. Coagulation Profile: No results for input(s): "INR", "PROTIME" in the last 168 hours. Cardiac Enzymes: No results for input(s): "CKTOTAL", "CKMB", "CKMBINDEX", "TROPONINI" in the last 168 hours. BNP (last 3 results) No results for input(s): "PROBNP" in the last 8760 hours. HbA1C: No results for input(s): "HGBA1C" in the last 72 hours. CBG: Recent Labs   Lab 10/28/23 1609 10/28/23 2019 10/28/23 2345 10/29/23 0419 10/29/23 0751  GLUCAP 81 81 86 72 71   Lipid Profile: No results for input(s): "CHOL", "HDL", "LDLCALC", "TRIG", "CHOLHDL", "LDLDIRECT" in the last 72 hours. Thyroid Function Tests: No results for input(s): "TSH", "T4TOTAL", "FREET4", "T3FREE", "THYROIDAB" in the last 72 hours. Anemia Panel: No results for input(s): "VITAMINB12", "FOLATE", "FERRITIN", "TIBC", "IRON", "RETICCTPCT" in the last 72 hours. Sepsis Labs: Recent Labs  Lab 10/25/23 2013  LATICACIDVEN 1.0    Recent Results (from the past 240 hours)  Urine Culture     Status: Abnormal   Collection Time: 10/25/23  7:47 AM   Specimen: Urine, Random  Result Value Ref Range Status   Specimen Description   Final    URINE, RANDOM Performed at [facility], [address] [place], [place] [address]    Special Requests   Final    NONE Reflexed from [id number] Performed at [facility], [address] [place], [place] [address]    Culture MULTIPLE SPECIES PRESENT,  SUGGEST RECOLLECTION (A)  Final   Report Status 10/26/2023 FINAL  Final  Blood Culture (routine x 2)     Status: None (Preliminary result)   Collection Time: 10/25/23  8:13 PM   Specimen: BLOOD  Result Value Ref Range Status   Specimen Description BLOOD BLOOD RIGHT FOREARM  Final   Special Requests   Final    BOTTLES DRAWN AEROBIC AND ANAEROBIC Blood Culture adequate volume   Culture   Final    NO GROWTH 4 DAYS Performed at [facility], [address] [place], [place] [address]    Report Status PENDING  Incomplete  Blood Culture (routine x 2)     Status: None (Preliminary result)   Collection Time: 10/25/23  8:36 PM   Specimen: BLOOD  Result Value Ref Range Status   Specimen Description BLOOD BLOOD RIGHT HAND  Final   Special Requests   Final    BOTTLES DRAWN AEROBIC AND ANAEROBIC Blood Culture adequate volume   Culture   Final    NO GROWTH 4 DAYS Performed at [facility], [address],  [place], [place] [address]    Report Status PENDING  Incomplete         Radiology Studies: No results found.       Scheduled Meds:  amLODipine  10 mg Oral Daily   buPROPion ER  100 mg Oral BID   dextromethorphan-guaiFENesin  1 tablet Oral BID   doxycycline  100 mg Oral Q12H   fluticasone furoate-vilanterol  1 puff Inhalation Daily   heparin  5,000 Units Subcutaneous Q8H   levothyroxine  150 mcg Oral Daily   melatonin  3 mg Oral QHS   polyethylene glycol  17 g Oral Daily   QUEtiapine  12.5 mg Oral QHS   senna-docusate  1 tablet Oral BID   tamsulosin  0.4 mg Oral Daily   umeclidinium bromide  1 puff Inhalation Daily   Continuous Infusions:  cefTRIAXone (ROCEPHIN)  IV 1 g (10/28/23 2135)     LOS: 4 days    Time spent: 52 minutes spent on chart review, discussion with nursing staff, consultants, updating family and interview/physical exam; more than 50% of that time was spent in counseling and/or coordination of care.    Alvira Philips [place], DO [facility] Available via Epic secure chat 7am-7pm After these hours, please refer to coverage provider listed on [email or web address] 10/29/2023, 10:49 AM

## 2023-10-30 LAB — CULTURE, BLOOD (ROUTINE X 2)
Culture: NO GROWTH
Culture: NO GROWTH
Special Requests: ADEQUATE
Special Requests: ADEQUATE

## 2023-11-01 ENCOUNTER — Non-Acute Institutional Stay (SKILLED_NURSING_FACILITY): Payer: Self-pay | Admitting: Adult Health

## 2023-11-01 ENCOUNTER — Encounter: Payer: Self-pay | Admitting: Adult Health

## 2023-11-01 DIAGNOSIS — E039 Hypothyroidism, unspecified: Secondary | ICD-10-CM

## 2023-11-01 DIAGNOSIS — R1310 Dysphagia, unspecified: Secondary | ICD-10-CM | POA: Diagnosis not present

## 2023-11-01 DIAGNOSIS — I129 Hypertensive chronic kidney disease with stage 1 through stage 4 chronic kidney disease, or unspecified chronic kidney disease: Secondary | ICD-10-CM | POA: Diagnosis not present

## 2023-11-01 DIAGNOSIS — G9341 Metabolic encephalopathy: Secondary | ICD-10-CM

## 2023-11-01 DIAGNOSIS — D638 Anemia in other chronic diseases classified elsewhere: Secondary | ICD-10-CM

## 2023-11-01 DIAGNOSIS — J9611 Chronic respiratory failure with hypoxia: Secondary | ICD-10-CM | POA: Diagnosis not present

## 2023-11-01 DIAGNOSIS — I5032 Chronic diastolic (congestive) heart failure: Secondary | ICD-10-CM | POA: Diagnosis not present

## 2023-11-01 DIAGNOSIS — C3411 Malignant neoplasm of upper lobe, right bronchus or lung: Secondary | ICD-10-CM | POA: Diagnosis not present

## 2023-11-01 DIAGNOSIS — I700 Atherosclerosis of aorta: Secondary | ICD-10-CM | POA: Diagnosis not present

## 2023-11-01 DIAGNOSIS — J449 Chronic obstructive pulmonary disease, unspecified: Secondary | ICD-10-CM | POA: Diagnosis not present

## 2023-11-01 DIAGNOSIS — N184 Chronic kidney disease, stage 4 (severe): Secondary | ICD-10-CM | POA: Diagnosis not present

## 2023-11-01 DIAGNOSIS — J189 Pneumonia, unspecified organism: Secondary | ICD-10-CM

## 2023-11-01 DIAGNOSIS — I110 Hypertensive heart disease with heart failure: Secondary | ICD-10-CM | POA: Diagnosis not present

## 2023-11-01 DIAGNOSIS — G894 Chronic pain syndrome: Secondary | ICD-10-CM

## 2023-11-01 NOTE — Progress Notes (Signed)
Location:  [facility] Room Number: [id number] Place of Service:  SNF (31)   CODE STATUS: dnr   Allergies  Allergen Reactions   Nsaids Other (See Comments)    Decreased renal function    Chief Complaint  Patient presents with   Hospitalization Follow-up    HPI:  He is a 89 year old man who has been hospitalized from 10-25-23 through 10-29-23. His past medical history includes: hypertension; hypothyroidism; stage IV NSCLC; chronic hypoxic respiratory failure on chronic 02. He had been found on the floor at this facility and had been delirious for aobut 2 days prior to hospitalization. He is non-ambulatory. He had a fevers of 103.2 degrees. He was taken to the ED.  Acute metabolic encephalopathy: he had confusion in the ED his CT scan did not reveal acute causes. The UA was consistent with uti. His ct chest demonstrated opacities consistent with pneumonia. He did receive haldol in the ED. His mental status has improved.  Community acquired pneumonia: presented with fever 103.2. The ct chest with new airspace opacities consistent with pneumonia. He has completed doxycycline and ceftriaxone.  He was treated with ceftriaxone for a presumed UTI Acute on chronic renal failure on chronic kidney disease stage 4; obstructive uropathy: CT abdomen/pelvis notable for moderate right hydroureter with increased left hydroureteronephrosis and diffuse bladder wall thickening.  He had urine retention of 500 cc with foley place. He is unable to take flomax as this medication cannot be crushed.  Dysphagia: is on D2 diet with thin liquids and crushed medications.  At this time the goal of his care is to return back home. He will continue to be followed for his chronic illnesses including: Benign hypertension with coincident congestive heart failure /  benign hypertension with chronic kidney disease stage IV:    Chronic respiratory failure with hypoxia / COPD (chronic obstructive pulmonary  disease)   Primary lung cancer right upper lobe stage IVA    Past Medical History:  Diagnosis Date   Arthritis    Hypertension    Hyperthyroidism    lung ca 11/2020    Past Surgical History:  Procedure Laterality Date   COLONOSCOPY N/A 09/02/2016   Procedure: COLONOSCOPY;  Surgeon: [name], MD;  Location: [facility];  Service: Endoscopy;  Laterality: N/A;  830   NO PAST SURGERIES     POLYPECTOMY  09/02/2016   Procedure: POLYPECTOMY;  Surgeon: [name], MD;  Location: [facility];  Service: Endoscopy;;    Social History   Socioeconomic History   Marital status: Divorced    Spouse name: Not on file   Number of children: Not on file   Years of education: Not on file   Highest education level: Not on file  Occupational History   Not on file  Tobacco Use   Smoking status: Former    Current packs/day: 0.00    Average packs/day: 1.5 packs/day for 40.0 years (60.0 ttl pk-yrs)    Types: Cigarettes    Start date: 37    Quit date: 2000    Years since quitting: 25.1   Smokeless tobacco: Never  Vaping Use   Vaping status: Never Used  Substance and Sexual Activity   Alcohol use: No   Drug use: No   Sexual activity: Not on file  Other Topics Concern   Not on file  Social History Narrative   Not on file   Social Drivers of Health   Financial Resource Strain:  Not on file  Food Insecurity: No Food Insecurity (10/07/2023)   Hunger Vital Sign    Worried About Running Out of Food in the Last Year: Never true    Ran Out of Food in the Last Year: Never true  Transportation Needs: No Transportation Needs (10/07/2023)   PRAPARE - Administrator, Civil Service (Medical): No    Lack of Transportation (Non-Medical): No  Physical Activity: Not on file  Stress: Not on file  Social Connections: Unknown (10/08/2023)   Social Connection and Isolation Panel [NHANES]    Frequency of Communication with Friends and Family: More than three times a week     Frequency of Social Gatherings with Friends and Family: More than three times a week    Attends Religious Services: Patient unable to answer    Active Member of Clubs or Organizations: No    Attends Banker Meetings: Never    Marital Status: Widowed  Intimate Partner Violence: Not At Risk (10/07/2023)   Humiliation, Afraid, Rape, and Kick questionnaire    Fear of Current or Ex-Partner: No    Emotionally Abused: No    Physically Abused: No    Sexually Abused: No   Family History  Problem Relation Age of Onset   Colon cancer Other       VITAL SIGNS BP 126/66   Pulse 66   Temp (!) 97.2 F (36.2 C)   Resp 18   Ht 6\' 2"  (1.88 m)   Wt 124 lb (56.2 kg)   SpO2 95%   BMI 15.92 kg/m   Outpatient Encounter Medications as of 11/01/2023  Medication Sig Note   buPROPion (WELLBUTRIN) 100 MG tablet Take 100 mg by mouth 2 (two) times daily.    ferrous sulfate 325 (65 FE) MG EC tablet Take 1 tablet (325 mg total) by mouth 3 (three) times a week.    melatonin 5 MG TABS Take 5 mg by mouth.    acetaminophen (TYLENOL) 650 MG CR tablet Take 650 mg by mouth every 6 (six) hours as needed for pain.    amLODipine (NORVASC) 10 MG tablet Take 1 tablet (10 mg total) by mouth daily. Hold for blood pressure reading <130    Calcium Carbonate Antacid 200 MG TBDP Take 200 mg by mouth daily.    fluticasone-salmeterol (ADVAIR) 250-50 MCG/ACT AEPB Inhale 1 puff into the lungs in the morning and at bedtime.    gabapentin (NEURONTIN) 250 MG/5ML solution Take 3 mLs (150 mg total) by mouth at bedtime.    labetalol (NORMODYNE) 200 MG tablet Take 1 tablet (200 mg total) by mouth 2 (two) times daily. Hold for blood pressure reading <130    levothyroxine (SYNTHROID) 150 MCG tablet Take 1 tablet (150 mcg total) by mouth daily.    LORazepam (ATIVAN) 0.5 MG tablet Take 1 tablet (0.5 mg total) by mouth every 8 (eight) hours as needed (agitation).    Nutritional Supplements (PROSOURCE) LIQD Take 30 mLs by mouth 3  (three) times daily.    polyethylene glycol (MIRALAX / GLYCOLAX) 17 g packet Take 17 g by mouth daily.    QUEtiapine (SEROQUEL) 25 MG tablet Take 0.5 tablets (12.5 mg total) by mouth at bedtime.    senna-docusate (SENOKOT-S) 8.6-50 MG tablet Take 1 tablet by mouth 2 (two) times daily.    umeclidinium bromide (INCRUSE ELLIPTA) 62.5 MCG/ACT AEPB Inhale 1 puff into the lungs daily.    No facility-administered encounter medications on file as of 11/01/2023.  SIGNIFICANT DIAGNOSTIC EXAMS  PREVIOUS   07-07-23: wbc 5.3; hgb 10.2; hct 32.9; mcv 95.9 plt 167; glucose 98; bun 26; creat 2.04; k+ 3.9; na++ 137; ca 8.0; gfr 31; protein 7.3 albumin 2.7 mag 1.9 07-08-23: tsh 9.279 vitamin B12: 479; folate 7.5; iron 21; tibc 168 07-15-23: wbc 3.9; hgb 8.3; hct 27.1; mcv 98.2 plt 181; glucose 105; bun 39; creat 2.23; k+ 4.3; na++ 137; ca 7.7; gfr 28; mag 1.8  07-20-23: wbc 4.5; hgb 8.1; hct 26.4; mcv 100.4 plt 175; glucose 93; bun 45; creat 2.08; k+ 4.3; na++ 138; ca 7.5; gfr 30 protein 5.5 albumin 2.1; tsh 6.070 07-26-23: wbc 3.6; hgb 8.3; hct 26.7; mcv 99.6 plt 178; glucose 86; bun 50; creat 2.18; k+ 4.8; na++ 139; ca 7.8; gfr 29; protein 6.0 albumin 2.2 iron 33 tibc 201 08-20-23: wbc 5.0; hgb 9.9; hct 31.9; mcv 97.3 plt 210; glucose 94; bun 74; creat 3.13; k+ 4.6; na++ 132; ca 8.3; gfr 19; protein 7.5 albumin 2.7  08-24-23: glucose 111; bun 68; creat 2.67; k+ 4.1; na++ 136; ca 7.8; gfr 22  TODAY  10-07-23: wbc 5.3; hgb 10.5; hct 32.5; mcv 90.3 plt 168; glucose 84; bun 66; creat 3.78; k+ 4.2; na++ 134; ca 8.7; gfr 15; protein 7.8; albumin 3.1; urine culture: proteus mirabilis  10-09-23: wbc 4.2; hgb 8.8; hct 26.8; mcv 89.0 plt 165; glucose 100; bun 66; creat 2.72; k+ 3.5; na++ 131; ca 8.1; gfr 22; mag 1.7 10-25-23: wbc 7.2; hgb 9.9; hct 32.1; mcv 93.0 plt 235; glucose 91; bun 101; creat 3.09; k+ 5.2; na++ 140 ca; 8.7; gfr 1.9 protein 7.4 albumin 3.2 ; mag 2.3 urine culture: mixed; blood culture no growth.    Review of Systems  Constitutional:  Positive for malaise/fatigue.  Respiratory:  Negative for cough and shortness of breath.   Cardiovascular:  Positive for leg swelling. Negative for chest pain and palpitations.  Gastrointestinal:  Negative for abdominal pain, constipation and heartburn.  Musculoskeletal:  Negative for back pain, joint pain and myalgias.  Skin: Negative.   Neurological:  Negative for dizziness.  Psychiatric/Behavioral:  The patient is not nervous/anxious.    Physical Exam Constitutional:      General: He is not in acute distress.    Appearance: He is well-developed. He is not diaphoretic.  Neck:     Thyroid: No thyromegaly.  Cardiovascular:     Rate and Rhythm: Normal rate and regular rhythm.     Pulses: Normal pulses.     Heart sounds: Normal heart sounds.  Pulmonary:     Effort: Pulmonary effort is normal. No respiratory distress.     Breath sounds: Normal breath sounds.  Abdominal:     General: Bowel sounds are normal. There is no distension.     Palpations: Abdomen is soft.     Tenderness: There is no abdominal tenderness.  Musculoskeletal:        General: Normal range of motion.     Cervical back: Neck supple.     Right lower leg: Edema present.     Left lower leg: Edema present.  Lymphadenopathy:     Cervical: No cervical adenopathy.  Skin:    General: Skin is warm and dry.  Neurological:     Mental Status: He is alert. Mental status is at baseline.  Psychiatric:        Mood and Affect: Mood normal.      ASSESSMENT/ PLAN:  TODAY  Community acquired pneumonia/acute metabolic encephalopathy : he has completed his  abt. Will continue with therapy as directed to improve upon his level of independence with his adls.   2. Benign hypertension with coincident congestive heart failure /  benign hypertension with chronic kidney disease stage IV: b/p 126/66 will continue norvasc 10 mg daily and labetolol 200 mg twice daily   3. Chronic respiratory  failure with hypoxia / COPD (chronic obstructive pulmonary disease) will continue  advair 250/50 1 puff twice daily; incruse 62.5 mcg 1 puff daily   4. Primary lung cancer right upper lobe stage IVA is followed by Dr. Shirline Frees s/p chemo, radiation, immunotherapy. Is presently being observed.   5. Acquired hypothyroidism: tsh 9.279 will continue synthroid 150 mcg daily   6. Benign prostatic hyperplasia without symptoms: currently has foley; needed to stop his flomax; his meds need to be crushed.   7. Anemia of chronic disease: hgb 9.9; iron 21; will continue iron three times weekly   8. Chronic pain syndrome: will continue gabapentin 150 mg daily   9.  Severe protein calorie malnutrition: albumin 3.1 will continue prosource 30 mL and ensure three times daily   10. Chronic diastolic congestive heart failure: is presently off diuretic   11. Depression, chronic: will continue wellbutrin 100 mg twice daily    12. Aortic atherosclerosis (ct 07-07-23) is not on statin due to advanced age.   13. Chronic kidney disease stage 4: bun 62; creat 2.66 gfr 22   14. Dysphagia: is on D2 diet with thin liquids and crushed medications.   Will check hgb/hct/bmp on 11-08-23   Synthia Innocent NP [facility]  call [phone]

## 2023-11-02 ENCOUNTER — Encounter: Payer: Self-pay | Admitting: Internal Medicine

## 2023-11-02 ENCOUNTER — Non-Acute Institutional Stay (SKILLED_NURSING_FACILITY): Payer: Self-pay | Admitting: Internal Medicine

## 2023-11-02 DIAGNOSIS — R627 Adult failure to thrive: Secondary | ICD-10-CM

## 2023-11-02 DIAGNOSIS — N184 Chronic kidney disease, stage 4 (severe): Secondary | ICD-10-CM | POA: Diagnosis not present

## 2023-11-02 DIAGNOSIS — J189 Pneumonia, unspecified organism: Secondary | ICD-10-CM

## 2023-11-02 DIAGNOSIS — G9341 Metabolic encephalopathy: Secondary | ICD-10-CM

## 2023-11-02 DIAGNOSIS — N179 Acute kidney failure, unspecified: Secondary | ICD-10-CM | POA: Diagnosis not present

## 2023-11-02 NOTE — Assessment & Plan Note (Addendum)
 The chronic adult failure to thrive is also in context of obstructive hydronephrosis with profound azotemia. Urology F/U with Dr Inga Manges as scheduled. Nutritionist to follow at [facility].

## 2023-11-02 NOTE — Assessment & Plan Note (Addendum)
 Actually this would be considered healthcare associated pneumonia as he had been in the hospital and subsequently the nursing home.  RLL PNA vs ATX & chronic post radiation changes in RUL. He received doxycycline  and ceftriaxone . He is afebrile and denies purulent sputum.  Continue to monitor.

## 2023-11-02 NOTE — Assessment & Plan Note (Addendum)
 He was rehospitalized 1/27 - 10/29/2023 with healthcare associated  RLL pneumonia ; moderate right hydroureter and increasing left hydroureteronephrosis with profound azotemia and end-stage renal disease. Mental status improved following placement of the Foley.  Continue to monitor at SNF.

## 2023-11-02 NOTE — Assessment & Plan Note (Addendum)
 10/25/23 CT of the abdomen/pelvis w/o contrast revealed moderate right hydronephrosis with increased left hydroureteronephrosis and diffuse bladder wall thickening.  Foley inserted for urinary retention of 517 mL.  With placement of Foley creatinine dropped from 3.09 down to a value of 2.66.  GFR rose from 19 @ admission to 22 at discharge, still low stage IV CKD. Urology outpatient follow-up will be completed after rehab for voiding trial & possible Foley removal.

## 2023-11-02 NOTE — Progress Notes (Signed)
 NURSING HOME LOCATION:  [facility] ROOM NUMBER:  [id number] P  CODE STATUS: DNR  PCP:  Rosaline Macadam FNP  This is a nursing facility follow up visit for Nursing Facility readmission within 30 days.  Interim medical record and care since last SNF visit was updated with review of diagnostic studies and change in clinical status since last visit were documented.  HPI: He was readmitted to the hospital 1/27 - 10/29/2023 sent from [facility] via EMS for AMS in context of elevated temperature to 103.2.  He had been found on the floor at bedside with his oxygen  off. In the ED he was unable to provide any history.  CXR revealed chronic masslike distortion in the periphery of the right upper lobe ,most compatible with area of chronic postradiation masslike fibrosis.  No other pulmonary infiltrates were noted. There was also clinical concern for uremia from AKI. For the AMS he received Haldol  in the emergency room. Doxycycline  and ceftriaxone  were initiated empirically.   Urinalysis was concerning for possible UTI revealing moderate leukocytes, negative nitrite, and 21-50 white blood cells.  Urine culture revealed multiple species. CT of the abdomen/pelvis 01/27/2023 had revealed bladder wall thickening attributed to chronic bladder outlet obstruction.  Renal ultrasound 07/08/2023 had revealed moderate bilateral hydronephrosis.  CT of the chest, abdomen, and pelvis without contrast 1/27 revealed new airspace opacities in the right lower lobe posteriorly suggesting atelectasis versus pneumonia.  There was a new moderate right hydroureter and increasing left hydroureteronephrosis.  No ureteral stone was present.  Diffuse bladder wall thickening was again documented.  Findings were postulated to be secondary to cystitis and ascending UTI.Bladder scan revealed retained urine of 517 mL.  Dr Watt Destine placed a Foley on 1/30.  Tamsulosin  0.4 mg was continued.  At admission BUN had been 100  and creatinine 3.09; following placement of the Foley the creatinine dropped to 2.66 at discharge.  GFR rose from a value of 19 at admission to a final value of 22, still low stage IV CKD.  Outpatient urologic follow-up was to be scheduled for voiding trial. Hyperkalemia was present with a potassium level of 5.4.  He did receive Lokelma  and IV fluid hydration with resolution.  Potassium was 3.8 at discharge. Anemia progressed slightly with final H/H of 9.1/29.1 , down from 9.9/32.1. Mental status did stabilize.  He was continued on melatonin 3 mg nightly as well as Seroquel  12.5 mg. Palliative care consulted and DNR initiated. He was deemed stable enough clinically to return to the SNF for rehab.  Review of systems: Dementia invalidated responses.  He stated he was hospitalized due to pneumonia.He had no comprehension of the renal issues, stating heard nothing about kidneys.SABRA  He indicates that he has not been able to ambulate because of pain in the RLE causing fall down.  He states this has improved somewhat.   His oxygen  dependent hypoxic respiratory failure is essentially stable, but  he stated breathing slows down when I do anything. He describes minimal sputum.  Physical exam:  Pertinent or positive findings: He appears cachectic and chronically ill.  Voice is weak and gravelly. Hair is full and disheveled.  He has mustache and goatee.  He has facial vitiligo with irregular hyperpigmentation in the periorbital areas as well as over the ears.  He is essentially edentulous with erosions to & below the gumline.  He exhibits the silent lungs of advanced COPD.Heart sounds are distant.  PMI is heard best in the epigastrium.  Foley in place.Pedal pulses are not palpable.  He has limb atrophy and interosseous wasting. The irregular vitiligo is also present on the neck, thorax and extremities.   General appearance:  no acute distress, increased work of breathing is present.   Lymphatic: No  lymphadenopathy about the head, neck, axilla. Eyes: No conjunctival inflammation or lid edema is present. There is no scleral icterus. Ears:  External ear exam shows no deformities.   Nose:  External nasal examination shows no deformity or inflammation. Nasal mucosa are pink and moist without lesions, exudates Neck:  No thyromegaly, masses, tenderness noted.    Heart:  No gallop, murmur, click, rub .  Lungs:  without wheezes, rhonchi, rales, rubs. Abdomen: Bowel sounds are normal. Abdomen is soft and nontender with no organomegaly, hernias, masses. GU: Deferred  Extremities:  No cyanosis, clubbing, edema  Neurologic exam :Balance, Rhomberg, finger to nose testing could not be completed due to clinical state Skin: Warm & dry w/o tenting. No significant  rash; diffuse vitiligo as noted.  See summary under each active problem in the Problem List with associated updated therapeutic plan

## 2023-11-02 NOTE — Patient Instructions (Signed)
 See assessment and plan under each diagnosis in the problem list and acutely for this visit

## 2023-11-03 DIAGNOSIS — R0902 Hypoxemia: Secondary | ICD-10-CM | POA: Diagnosis not present

## 2023-11-03 DIAGNOSIS — M6281 Muscle weakness (generalized): Secondary | ICD-10-CM | POA: Diagnosis not present

## 2023-11-03 DIAGNOSIS — J441 Chronic obstructive pulmonary disease with (acute) exacerbation: Secondary | ICD-10-CM | POA: Diagnosis not present

## 2023-11-03 DIAGNOSIS — R262 Difficulty in walking, not elsewhere classified: Secondary | ICD-10-CM | POA: Diagnosis not present

## 2023-11-03 DIAGNOSIS — R2689 Other abnormalities of gait and mobility: Secondary | ICD-10-CM | POA: Diagnosis not present

## 2023-11-03 DIAGNOSIS — I509 Heart failure, unspecified: Secondary | ICD-10-CM | POA: Diagnosis not present

## 2023-11-03 DIAGNOSIS — J449 Chronic obstructive pulmonary disease, unspecified: Secondary | ICD-10-CM | POA: Diagnosis not present

## 2023-11-03 DIAGNOSIS — I110 Hypertensive heart disease with heart failure: Secondary | ICD-10-CM | POA: Diagnosis not present

## 2023-11-03 DIAGNOSIS — J9601 Acute respiratory failure with hypoxia: Secondary | ICD-10-CM | POA: Diagnosis not present

## 2023-11-03 DIAGNOSIS — R531 Weakness: Secondary | ICD-10-CM | POA: Diagnosis not present

## 2023-11-03 DIAGNOSIS — C3412 Malignant neoplasm of upper lobe, left bronchus or lung: Secondary | ICD-10-CM | POA: Diagnosis not present

## 2023-11-03 DIAGNOSIS — N184 Chronic kidney disease, stage 4 (severe): Secondary | ICD-10-CM | POA: Diagnosis not present

## 2023-11-08 ENCOUNTER — Encounter: Payer: Self-pay | Admitting: Adult Health

## 2023-11-08 ENCOUNTER — Other Ambulatory Visit (HOSPITAL_COMMUNITY)
Admission: RE | Admit: 2023-11-08 | Discharge: 2023-11-08 | Disposition: A | Discharge status: Home / Self Care | Payer: Medicare HMO | Source: Skilled Nursing Facility | Attending: Adult Health | Admitting: Adult Health

## 2023-11-08 ENCOUNTER — Non-Acute Institutional Stay (SKILLED_NURSING_FACILITY): Payer: Self-pay | Admitting: Adult Health

## 2023-11-08 DIAGNOSIS — E875 Hyperkalemia: Secondary | ICD-10-CM | POA: Diagnosis not present

## 2023-11-08 DIAGNOSIS — I10 Essential (primary) hypertension: Secondary | ICD-10-CM | POA: Insufficient documentation

## 2023-11-08 LAB — COMPREHENSIVE METABOLIC PANEL
ALT: 23 U/L (ref 0–44)
AST: 20 U/L (ref 15–41)
Albumin: 2.7 g/dL — ABNORMAL LOW (ref 3.5–5.0)
Alkaline Phosphatase: 71 U/L (ref 38–126)
Anion gap: 6 (ref 5–15)
BUN: 60 mg/dL — ABNORMAL HIGH (ref 8–23)
CO2: 25 mmol/L (ref 22–32)
Calcium: 8.6 mg/dL — ABNORMAL LOW (ref 8.9–10.3)
Chloride: 106 mmol/L (ref 98–111)
Creatinine, Ser: 2.33 mg/dL — ABNORMAL HIGH (ref 0.61–1.24)
GFR, Estimated: 26 mL/min — ABNORMAL LOW (ref >60)
Glucose, Bld: 82 mg/dL (ref 70–99)
Potassium: 5.3 mmol/L — ABNORMAL HIGH (ref 3.5–5.1)
Sodium: 137 mmol/L (ref 135–145)
Total Bilirubin: 0.3 mg/dL (ref 0.0–1.2)
Total Protein: 6.6 g/dL (ref 6.5–8.1)

## 2023-11-08 LAB — HEMOGLOBIN AND HEMATOCRIT, BLOOD
HCT: 28.4 % — ABNORMAL LOW (ref 39.0–52.0)
Hemoglobin: 8.6 g/dL — ABNORMAL LOW (ref 13.0–17.0)

## 2023-11-08 NOTE — Progress Notes (Signed)
Location:  [facility] Room Number: 160 Place of Service:  SNF (31)   CODE STATUS: dnr   Allergies  Allergen Reactions   Nsaids Other (See Comments)    Decreased renal function    Chief Complaint  Patient presents with   Acute Visit    Follow up lab work     HPI:  His k+ is 5.3.  His hgb is slightly lower at 8.6. there are no indications of blood in stools. His renal function is without significant change. His albumin is 2.7; he has recently been started on prosource.   Past Medical History:  Diagnosis Date   Arthritis    Hypertension    Hyperthyroidism    lung ca 11/2020    Past Surgical History:  Procedure Laterality Date   COLONOSCOPY N/A 09/02/2016   Procedure: COLONOSCOPY;  Surgeon: [name], MD;  Location: [facility];  Service: Endoscopy;  Laterality: N/A;  830   NO PAST SURGERIES     POLYPECTOMY  09/02/2016   Procedure: POLYPECTOMY;  Surgeon: [name], MD;  Location: [facility];  Service: Endoscopy;;    Social History   Socioeconomic History   Marital status: Divorced    Spouse name: Not on file   Number of children: Not on file   Years of education: Not on file   Highest education level: Not on file  Occupational History   Not on file  Tobacco Use   Smoking status: Former    Current packs/day: 0.00    Average packs/day: 1.5 packs/day for 40.0 years (60.0 ttl pk-yrs)    Types: Cigarettes    Start date: 29    Quit date: 2000    Years since quitting: 25.1   Smokeless tobacco: Never  Vaping Use   Vaping status: Never Used  Substance and Sexual Activity   Alcohol use: No   Drug use: No   Sexual activity: Not on file  Other Topics Concern   Not on file  Social History Narrative   Not on file   Social Drivers of Health   Financial Resource Strain: Not on file  Food Insecurity: No Food Insecurity (10/07/2023)   Hunger Vital Sign    Worried About Running Out of Food in the Last Year: Never true     Ran Out of Food in the Last Year: Never true  Transportation Needs: No Transportation Needs (10/07/2023)   PRAPARE - Administrator, Civil Service (Medical): No    Lack of Transportation (Non-Medical): No  Physical Activity: Not on file  Stress: Not on file  Social Connections: Unknown (10/08/2023)   Social Connection and Isolation Panel [NHANES]    Frequency of Communication with Friends and Family: More than three times a week    Frequency of Social Gatherings with Friends and Family: More than three times a week    Attends Religious Services: Patient unable to answer    Active Member of Clubs or Organizations: No    Attends Banker Meetings: Never    Marital Status: Widowed  Intimate Partner Violence: Not At Risk (10/07/2023)   Humiliation, Afraid, Rape, and Kick questionnaire    Fear of Current or Ex-Partner: No    Emotionally Abused: No    Physically Abused: No    Sexually Abused: No   Family History  Problem Relation Age of Onset   Colon cancer Other       VITAL SIGNS BP 138/71  Pulse 84   Temp 98.1 F (36.7 C)   Resp 19   Ht 6\' 2"  (1.88 m)   Wt 124 lb 9.6 oz (56.5 kg)   SpO2 96%   BMI 16.00 kg/m   Outpatient Encounter Medications as of 11/08/2023  Medication Sig   acetaminophen (TYLENOL) 650 MG CR tablet Take 650 mg by mouth every 6 (six) hours as needed for pain.   amLODipine (NORVASC) 10 MG tablet Take 1 tablet (10 mg total) by mouth daily. Hold for blood pressure reading <130   buPROPion (WELLBUTRIN) 100 MG tablet Take 100 mg by mouth 2 (two) times daily.   Calcium Carbonate Antacid 200 MG TBDP Take 200 mg by mouth daily.   ferrous sulfate 325 (65 FE) MG EC tablet Take 1 tablet (325 mg total) by mouth 3 (three) times a week.   fluticasone-salmeterol (ADVAIR) 250-50 MCG/ACT AEPB Inhale 1 puff into the lungs in the morning and at bedtime.   gabapentin (NEURONTIN) 250 MG/5ML solution Take 3 mLs (150 mg total) by mouth at bedtime.   labetalol  (NORMODYNE) 200 MG tablet Take 1 tablet (200 mg total) by mouth 2 (two) times daily. Hold for blood pressure reading <130   levothyroxine (SYNTHROID) 150 MCG tablet Take 1 tablet (150 mcg total) by mouth daily.   LORazepam (ATIVAN) 0.5 MG tablet Take 1 tablet (0.5 mg total) by mouth every 8 (eight) hours as needed (agitation).   melatonin 5 MG TABS Take 5 mg by mouth.   Nutritional Supplements (PROSOURCE) LIQD Take 30 mLs by mouth 3 (three) times daily.   polyethylene glycol (MIRALAX / GLYCOLAX) 17 g packet Take 17 g by mouth daily.   QUEtiapine (SEROQUEL) 25 MG tablet Take 0.5 tablets (12.5 mg total) by mouth at bedtime.   senna-docusate (SENOKOT-S) 8.6-50 MG tablet Take 1 tablet by mouth 2 (two) times daily.   umeclidinium bromide (INCRUSE ELLIPTA) 62.5 MCG/ACT AEPB Inhale 1 puff into the lungs daily.   No facility-administered encounter medications on file as of 11/08/2023.     SIGNIFICANT DIAGNOSTIC EXAMS  PREVIOUS   07-07-23: wbc 5.3; hgb 10.2; hct 32.9; mcv 95.9 plt 167; glucose 98; bun 26; creat 2.04; k+ 3.9; na++ 137; ca 8.0; gfr 31; protein 7.3 albumin 2.7 mag 1.9 07-08-23: tsh 9.279 vitamin B12: 479; folate 7.5; iron 21; tibc 168 07-15-23: wbc 3.9; hgb 8.3; hct 27.1; mcv 98.2 plt 181; glucose 105; bun 39; creat 2.23; k+ 4.3; na++ 137; ca 7.7; gfr 28; mag 1.8  07-20-23: wbc 4.5; hgb 8.1; hct 26.4; mcv 100.4 plt 175; glucose 93; bun 45; creat 2.08; k+ 4.3; na++ 138; ca 7.5; gfr 30 protein 5.5 albumin 2.1; tsh 6.070 07-26-23: wbc 3.6; hgb 8.3; hct 26.7; mcv 99.6 plt 178; glucose 86; bun 50; creat 2.18; k+ 4.8; na++ 139; ca 7.8; gfr 29; protein 6.0 albumin 2.2 iron 33 tibc 201 08-20-23: wbc 5.0; hgb 9.9; hct 31.9; mcv 97.3 plt 210; glucose 94; bun 74; creat 3.13; k+ 4.6; na++ 132; ca 8.3; gfr 19; protein 7.5 albumin 2.7  08-24-23: glucose 111; bun 68; creat 2.67; k+ 4.1; na++ 136; ca 7.8; gfr 22 10-07-23: wbc 5.3; hgb 10.5; hct 32.5; mcv 90.3 plt 168; glucose 84; bun 66; creat 3.78; k+  4.2; na++ 134; ca 8.7; gfr 15; protein 7.8; albumin 3.1; urine culture: proteus mirabilis  10-09-23: wbc 4.2; hgb 8.8; hct 26.8; mcv 89.0 plt 165; glucose 100; bun 66; creat 2.72; k+ 3.5; na++ 131; ca 8.1; gfr 22; mag 1.7  10-25-23: wbc 7.2; hgb 9.9; hct 32.1; mcv 93.0 plt 235; glucose 91; bun 101; creat 3.09; k+ 5.2; na++ 140 ca; 8.7; gfr 1.9 protein 7.4 albumin 3.2 ; mag 2.3 urine culture: mixed; blood culture no growth.   TODAY   11-08-23: hgb 8.6; hct 28.4 glucose 82; bun 60; creat 2.33 k+ 5.3 albumin 137; ca 8.6 gfr 26 protein 6.6 albumin 2.7   Review of Systems  Constitutional:  Negative for malaise/fatigue.  Respiratory:  Negative for cough and shortness of breath.   Cardiovascular:  Negative for chest pain, palpitations and leg swelling.  Gastrointestinal:  Negative for abdominal pain, constipation and heartburn.  Musculoskeletal:  Negative for back pain, joint pain and myalgias.  Skin: Negative.   Neurological:  Negative for dizziness.  Psychiatric/Behavioral:  The patient is not nervous/anxious.    Physical Exam Constitutional:      General: He is not in acute distress.    Appearance: He is well-developed. He is not diaphoretic.  Neck:     Thyroid: No thyromegaly.  Cardiovascular:     Rate and Rhythm: Normal rate and regular rhythm.     Pulses: Normal pulses.     Heart sounds: Normal heart sounds.  Pulmonary:     Effort: Pulmonary effort is normal. No respiratory distress.     Breath sounds: Normal breath sounds.  Abdominal:     General: Bowel sounds are normal. There is no distension.     Palpations: Abdomen is soft.     Tenderness: There is no abdominal tenderness.  Musculoskeletal:        General: Normal range of motion.     Cervical back: Neck supple.     Right lower leg: Edema present.     Left lower leg: Edema present.  Lymphadenopathy:     Cervical: No cervical adenopathy.  Skin:    General: Skin is warm and dry.  Neurological:     Mental Status: He is alert.  Mental status is at baseline.  Psychiatric:        Mood and Affect: Mood normal.      ASSESSMENT/ PLAN:  TODAY  Hyperkalemia: will give lokelma 10 gm twice daily for one day and will repeat k+ level.    Synthia Innocent NP [facility]   call [phone]

## 2023-11-09 ENCOUNTER — Non-Acute Institutional Stay (SKILLED_NURSING_FACILITY): Payer: Self-pay | Admitting: Adult Health

## 2023-11-09 ENCOUNTER — Encounter: Payer: Self-pay | Admitting: Adult Health

## 2023-11-09 ENCOUNTER — Other Ambulatory Visit (HOSPITAL_COMMUNITY)
Admission: RE | Admit: 2023-11-09 | Discharge: 2023-11-09 | Disposition: A | Discharge status: Home / Self Care | Payer: Medicare HMO | Source: Skilled Nursing Facility | Attending: Adult Health | Admitting: Adult Health

## 2023-11-09 DIAGNOSIS — E875 Hyperkalemia: Secondary | ICD-10-CM | POA: Diagnosis not present

## 2023-11-09 DIAGNOSIS — N184 Chronic kidney disease, stage 4 (severe): Secondary | ICD-10-CM

## 2023-11-09 DIAGNOSIS — E876 Hypokalemia: Secondary | ICD-10-CM | POA: Insufficient documentation

## 2023-11-09 LAB — POTASSIUM: Potassium: 5.2 mmol/L — ABNORMAL HIGH (ref 3.5–5.1)

## 2023-11-09 NOTE — Progress Notes (Unsigned)
Location:  [facility] Room Number: 160 Place of Service:  SNF (31)   CODE STATUS: dnr   Allergies  Allergen Reactions   Nsaids Other (See Comments)    Decreased renal function    Chief Complaint  Patient presents with   Acute Visit    Follow up labs.     HPI:  He remains hyperkalemic with k+ level of 5.2. he did receive lokelma yesterday. There are no reports of uncontrolled pain.   Past Medical History:  Diagnosis Date   Arthritis    Hypertension    Hyperthyroidism    lung ca 11/2020    Past Surgical History:  Procedure Laterality Date   COLONOSCOPY N/A 09/02/2016   Procedure: COLONOSCOPY;  Surgeon: [name], MD;  Location: [facility];  Service: Endoscopy;  Laterality: N/A;  830   NO PAST SURGERIES     POLYPECTOMY  09/02/2016   Procedure: POLYPECTOMY;  Surgeon: [name], MD;  Location: [facility];  Service: Endoscopy;;    Social History   Socioeconomic History   Marital status: Divorced    Spouse name: Not on file   Number of children: Not on file   Years of education: Not on file   Highest education level: Not on file  Occupational History   Not on file  Tobacco Use   Smoking status: Former    Current packs/day: 0.00    Average packs/day: 1.5 packs/day for 40.0 years (60.0 ttl pk-yrs)    Types: Cigarettes    Start date: 76    Quit date: 2000    Years since quitting: 25.1   Smokeless tobacco: Never  Vaping Use   Vaping status: Never Used  Substance and Sexual Activity   Alcohol use: No   Drug use: No   Sexual activity: Not on file  Other Topics Concern   Not on file  Social History Narrative   Not on file   Social Drivers of Health   Financial Resource Strain: Not on file  Food Insecurity: No Food Insecurity (10/07/2023)   Hunger Vital Sign    Worried About Running Out of Food in the Last Year: Never true    Ran Out of Food in the Last Year: Never true  Transportation Needs: No Transportation  Needs (10/07/2023)   PRAPARE - Administrator, Civil Service (Medical): No    Lack of Transportation (Non-Medical): No  Physical Activity: Not on file  Stress: Not on file  Social Connections: Unknown (10/08/2023)   Social Connection and Isolation Panel [NHANES]    Frequency of Communication with Friends and Family: More than three times a week    Frequency of Social Gatherings with Friends and Family: More than three times a week    Attends Religious Services: Patient unable to answer    Active Member of Clubs or Organizations: No    Attends Banker Meetings: Never    Marital Status: Widowed  Intimate Partner Violence: Not At Risk (10/07/2023)   Humiliation, Afraid, Rape, and Kick questionnaire    Fear of Current or Ex-Partner: No    Emotionally Abused: No    Physically Abused: No    Sexually Abused: No   Family History  Problem Relation Age of Onset   Colon cancer Other       VITAL SIGNS BP (!) 133/57   Pulse 64   Temp 98.3 F (36.8 C)   Resp 20   Ht 6'  2" (1.88 m)   Wt 130 lb (59 kg)   SpO2 96%   BMI 16.69 kg/m   Outpatient Encounter Medications as of 11/09/2023  Medication Sig   acetaminophen (TYLENOL) 650 MG CR tablet Take 650 mg by mouth every 6 (six) hours as needed for pain.   amLODipine (NORVASC) 10 MG tablet Take 1 tablet (10 mg total) by mouth daily. Hold for blood pressure reading <130   buPROPion (WELLBUTRIN) 100 MG tablet Take 100 mg by mouth 2 (two) times daily.   Calcium Carbonate Antacid 200 MG TBDP Take 200 mg by mouth daily.   ferrous sulfate 325 (65 FE) MG EC tablet Take 1 tablet (325 mg total) by mouth 3 (three) times a week.   fluticasone-salmeterol (ADVAIR) 250-50 MCG/ACT AEPB Inhale 1 puff into the lungs in the morning and at bedtime.   gabapentin (NEURONTIN) 250 MG/5ML solution Take 3 mLs (150 mg total) by mouth at bedtime.   labetalol (NORMODYNE) 200 MG tablet Take 1 tablet (200 mg total) by mouth 2 (two) times daily. Hold  for blood pressure reading <130   levothyroxine (SYNTHROID) 150 MCG tablet Take 1 tablet (150 mcg total) by mouth daily.   LORazepam (ATIVAN) 0.5 MG tablet Take 1 tablet (0.5 mg total) by mouth every 8 (eight) hours as needed (agitation).   melatonin 5 MG TABS Take 5 mg by mouth.   Nutritional Supplements (PROSOURCE) LIQD Take 30 mLs by mouth 3 (three) times daily.   polyethylene glycol (MIRALAX / GLYCOLAX) 17 g packet Take 17 g by mouth daily.   QUEtiapine (SEROQUEL) 25 MG tablet Take 0.5 tablets (12.5 mg total) by mouth at bedtime.   senna-docusate (SENOKOT-S) 8.6-50 MG tablet Take 1 tablet by mouth 2 (two) times daily.   umeclidinium bromide (INCRUSE ELLIPTA) 62.5 MCG/ACT AEPB Inhale 1 puff into the lungs daily.   No facility-administered encounter medications on file as of 11/09/2023.     SIGNIFICANT DIAGNOSTIC EXAMS  PREVIOUS   07-07-23: wbc 5.3; hgb 10.2; hct 32.9; mcv 95.9 plt 167; glucose 98; bun 26; creat 2.04; k+ 3.9; na++ 137; ca 8.0; gfr 31; protein 7.3 albumin 2.7 mag 1.9 07-08-23: tsh 9.279 vitamin B12: 479; folate 7.5; iron 21; tibc 168 07-15-23: wbc 3.9; hgb 8.3; hct 27.1; mcv 98.2 plt 181; glucose 105; bun 39; creat 2.23; k+ 4.3; na++ 137; ca 7.7; gfr 28; mag 1.8  07-20-23: wbc 4.5; hgb 8.1; hct 26.4; mcv 100.4 plt 175; glucose 93; bun 45; creat 2.08; k+ 4.3; na++ 138; ca 7.5; gfr 30 protein 5.5 albumin 2.1; tsh 6.070 07-26-23: wbc 3.6; hgb 8.3; hct 26.7; mcv 99.6 plt 178; glucose 86; bun 50; creat 2.18; k+ 4.8; na++ 139; ca 7.8; gfr 29; protein 6.0 albumin 2.2 iron 33 tibc 201 08-20-23: wbc 5.0; hgb 9.9; hct 31.9; mcv 97.3 plt 210; glucose 94; bun 74; creat 3.13; k+ 4.6; na++ 132; ca 8.3; gfr 19; protein 7.5 albumin 2.7  08-24-23: glucose 111; bun 68; creat 2.67; k+ 4.1; na++ 136; ca 7.8; gfr 22 10-07-23: wbc 5.3; hgb 10.5; hct 32.5; mcv 90.3 plt 168; glucose 84; bun 66; creat 3.78; k+ 4.2; na++ 134; ca 8.7; gfr 15; protein 7.8; albumin 3.1; urine culture: proteus mirabilis   10-09-23: wbc 4.2; hgb 8.8; hct 26.8; mcv 89.0 plt 165; glucose 100; bun 66; creat 2.72; k+ 3.5; na++ 131; ca 8.1; gfr 22; mag 1.7 10-25-23: wbc 7.2; hgb 9.9; hct 32.1; mcv 93.0 plt 235; glucose 91; bun 101; creat 3.09; k+ 5.2;  na++ 140 ca; 8.7; gfr 1.9 protein 7.4 albumin 3.2 ; mag 2.3 urine culture: mixed; blood culture no growth.   TODAY   11-08-23: hgb 8.6; hct 28.4 glucose 82; bun 60; creat 2.33 k+ 5.3 albumin 137; ca 8.6 gfr 26 protein 6.6 albumin 2.7  11-09-23: k+ 5.2   Review of Systems  Constitutional:  Negative for malaise/fatigue.  Respiratory:  Negative for cough and shortness of breath.   Cardiovascular:  Negative for chest pain, palpitations and leg swelling.  Gastrointestinal:  Negative for abdominal pain, constipation and heartburn.  Musculoskeletal:  Negative for back pain, joint pain and myalgias.  Skin: Negative.   Neurological:  Negative for dizziness.  Psychiatric/Behavioral:  The patient is not nervous/anxious.    Physical Exam Constitutional:      General: He is not in acute distress.    Appearance: He is well-developed. He is not diaphoretic.  Neck:     Thyroid: No thyromegaly.  Cardiovascular:     Rate and Rhythm: Normal rate and regular rhythm.     Pulses: Normal pulses.     Heart sounds: Normal heart sounds.  Pulmonary:     Effort: Pulmonary effort is normal. No respiratory distress.     Breath sounds: Normal breath sounds.  Abdominal:     General: Bowel sounds are normal. There is no distension.     Palpations: Abdomen is soft.     Tenderness: There is no abdominal tenderness.  Musculoskeletal:        General: Normal range of motion.     Cervical back: Neck supple.     Right lower leg: Edema present.     Left lower leg: Edema present.  Lymphadenopathy:     Cervical: No cervical adenopathy.  Skin:    General: Skin is warm and dry.  Neurological:     Mental Status: He is alert. Mental status is at baseline.  Psychiatric:        Mood and Affect:  Mood normal.      ASSESSMENT/ PLAN:  TODAY  Chronic kidney disease stage 4 Hyperkalemia  Will give lokelma 10 mg twice and will repeat labs.    Synthia Innocent NP [facility]  call [phone]

## 2023-11-10 ENCOUNTER — Other Ambulatory Visit (HOSPITAL_COMMUNITY)
Admission: RE | Admit: 2023-11-10 | Discharge: 2023-11-10 | Disposition: A | Discharge status: Home / Self Care | Payer: Medicare HMO | Source: Skilled Nursing Facility | Attending: Adult Health | Admitting: Adult Health

## 2023-11-10 DIAGNOSIS — E875 Hyperkalemia: Secondary | ICD-10-CM | POA: Insufficient documentation

## 2023-11-10 LAB — POTASSIUM: Potassium: 4.8 mmol/L (ref 3.5–5.1)

## 2023-11-10 NOTE — Progress Notes (Unsigned)
Name: [name] DOB: January 22, 1933 MRN: [id number]  History of Present Illness: [name] is a 89 y.o. male who presents today for follow up visit at [facility] . He is accompanied by ***, who assists with providing history. - GU history: 1. Prostate cancer. - 07/24/2015: Prostate biopsy by Dr. [name] (pathology positive for prostate adenocarcinoma). - 11/27/2015: Underwent gold seed placement at [facility] prior to IMRT in [place].  - No PSA values within past 10 years found per chart review. 2. Bilateral renal cysts. 3. CKD stage 4. Followed by Nephrology [facility]).  4. Erectile dysfunction.   At last visit on 04/06/2023: - Exam: "Penile skin tissue appears dry and white with nearly complete loss of melanin throughout. There are areas of scabbing around the corona; no active bleeding noted. No edema, erythema, or warmth. There was a small amount of white discharge noted at urethral orifice, which was swabbed for culture." - "We discussed penile irritation with loss of pigmentation. Possible etiologies include but are not limited to: vitiligo secondary to oncologic medications, lichen sclerosus / balanitis xerotica obliterans, penile cancer." - Advised the following: Culture swab sent to evaluate penile discharge. Grew heavy growth of MRSA. Referred to Infectious Disease due to antibiotic resistance.  Clobetasol 0.05% PRN.  Vaseline use daily as a skin protectorant.  Referral placed to dermatology for further evaluation.  Relevant records requested from PCP & nephrology.  Urine culture (grew Beta hemolytic Streptococcus, group B; Augmentin prescribed).  Return in about 3 months (around 07/07/2023) for f/u with Dr. [name].  Since last visit: > 10/07/2023 - 10/11/2023: Admitted for acute metabolic encephalopathy in the setting of AKI as well as generalized weakness with suspicion of failure to thrive.   > 10/25/2023 - 10/29/2023:  - Admitted  for pneumonia and uremia from AKI.  - Creatinine elevated to 3.09 upon admission. His baseline creatinine is 2.5-2.7. - Initially suspected to have UTI based on urinalysis with moderate leukocytes, however urine culture came back with multiple species present.  - 10/25/2023: CT abdomen/pelvis notable for moderate right hydroureter with increased left hydroureteronephrosis and diffuse bladder wall thickening.   - 10/27/2023: Bladder scan showed urinary retention (PVR = 517 mL).  - 10/28/2023: Foley catheter placed by urology (Dr. Annabell Howells). - Creatinine improved to 2.66 at time of discharge.   - Advised to continue Flomax (Tamsulosin) 0.4 mg daily.  Today: He reports ***  He reports the catheter is draining ***.  He {Actions; denies-reports:120008} gross hematuria.  He {Actions; denies-reports:120008} flank pain or abdominal pain. He {Actions; denies-reports:120008} fevers, nausea, or vomiting.  ***will be due for cath exchange around 11/27/23 ***Advised to continue with indwelling Foley catheter at this time due to immobility and high likelihood of recurrent urinary retention / hydronephrosis   Fall Screening: Do you usually have a device to assist in your mobility? {yes/no:20286} ***cane / ***walker / ***wheelchair   Medications: Current Outpatient Medications  Medication Sig Dispense Refill   acetaminophen (TYLENOL) 650 MG CR tablet Take 650 mg by mouth every 6 (six) hours as needed for pain.     amLODipine (NORVASC) 10 MG tablet Take 1 tablet (10 mg total) by mouth daily. Hold for blood pressure reading <130 30 tablet 0   buPROPion (WELLBUTRIN) 100 MG tablet Take 100 mg by mouth 2 (two) times daily.     Calcium Carbonate Antacid 200 MG TBDP Take 200 mg by mouth daily.     ferrous sulfate 325 (65 FE) MG EC tablet  Take 1 tablet (325 mg total) by mouth 3 (three) times a week. 12 tablet 0   fluticasone-salmeterol (ADVAIR) 250-50 MCG/ACT AEPB Inhale 1 puff into the lungs in the morning and at  bedtime. 60 each 0   gabapentin (NEURONTIN) 250 MG/5ML solution Take 3 mLs (150 mg total) by mouth at bedtime. 90 mL 0   labetalol (NORMODYNE) 200 MG tablet Take 1 tablet (200 mg total) by mouth 2 (two) times daily. Hold for blood pressure reading <130 60 tablet 0   levothyroxine (SYNTHROID) 150 MCG tablet Take 1 tablet (150 mcg total) by mouth daily. 30 tablet 0   LORazepam (ATIVAN) 0.5 MG tablet Take 1 tablet (0.5 mg total) by mouth every 8 (eight) hours as needed (agitation). 30 tablet 0   melatonin 5 MG TABS Take 5 mg by mouth.     Nutritional Supplements (PROSOURCE) LIQD Take 30 mLs by mouth 3 (three) times daily.     polyethylene glycol (MIRALAX / GLYCOLAX) 17 g packet Take 17 g by mouth daily.     QUEtiapine (SEROQUEL) 25 MG tablet Take 0.5 tablets (12.5 mg total) by mouth at bedtime. 15 tablet 0   senna-docusate (SENOKOT-S) 8.6-50 MG tablet Take 1 tablet by mouth 2 (two) times daily.     umeclidinium bromide (INCRUSE ELLIPTA) 62.5 MCG/ACT AEPB Inhale 1 puff into the lungs daily.     No current facility-administered medications for this visit.    Allergies: Allergies  Allergen Reactions   Nsaids Other (See Comments)    Decreased renal function    Past Medical History:  Diagnosis Date   Arthritis    Hypertension    Hyperthyroidism    lung ca 11/2020   Past Surgical History:  Procedure Laterality Date   COLONOSCOPY N/A 09/02/2016   Procedure: COLONOSCOPY;  Surgeon: [name], MD;  [facility];  Service: Endoscopy;  Laterality: N/A;  830   NO PAST SURGERIES     POLYPECTOMY  09/02/2016   Procedure: POLYPECTOMY;  Surgeon: [name], MD;  Location: [facility];  Service: Endoscopy;;   Family History  Problem Relation Age of Onset   Colon cancer Other    Social History   Socioeconomic History   Marital status: Divorced    Spouse name: Not on file   Number of children: Not on file   Years of education: Not on file   Highest education level: Not  on file  Occupational History   Not on file  Tobacco Use   Smoking status: Former    Current packs/day: 0.00    Average packs/day: 1.5 packs/day for 40.0 years (60.0 ttl pk-yrs)    Types: Cigarettes    Start date: 67    Quit date: 2000    Years since quitting: 25.1   Smokeless tobacco: Never  Vaping Use   Vaping status: Never Used  Substance and Sexual Activity   Alcohol use: No   Drug use: No   Sexual activity: Not on file  Other Topics Concern   Not on file  Social History Narrative   Not on file   Social Drivers of Health   Financial Resource Strain: Not on file  Food Insecurity: No Food Insecurity (10/07/2023)   Hunger Vital Sign    Worried About Running Out of Food in the Last Year: Never true    Ran Out of Food in the Last Year: Never true  Transportation Needs: No Transportation Needs (10/07/2023)   PRAPARE - Transportation  Lack of Transportation (Medical): No    Lack of Transportation (Non-Medical): No  Physical Activity: Not on file  Stress: Not on file  Social Connections: Unknown (10/08/2023)   Social Connection and Isolation Panel [NHANES]    Frequency of Communication with Friends and Family: More than three times a week    Frequency of Social Gatherings with Friends and Family: More than three times a week    Attends Religious Services: Patient unable to answer    Active Member of Clubs or Organizations: No    Attends Banker Meetings: Never    Marital Status: Widowed  Intimate Partner Violence: Not At Risk (10/07/2023)   Humiliation, Afraid, Rape, and Kick questionnaire    Fear of Current or Ex-Partner: No    Emotionally Abused: No    Physically Abused: No    Sexually Abused: No    Review of Systems Constitutional: Patient denies any unintentional weight loss or change in strength lntegumentary: Patient denies any rashes or pruritus Cardiovascular: Patient denies chest pain or syncope Respiratory: Patient denies shortness of  breath Gastrointestinal: Patient ***denies nausea, vomiting, constipation, or diarrhea Musculoskeletal: Patient denies muscle cramps or weakness Neurologic: Patient denies convulsions or seizures Allergic/Immunologic: Patient denies recent allergic reaction(s) Hematologic/Lymphatic: Patient denies bleeding tendencies Endocrine: Patient denies heat/cold intolerance  GU: As per HPI.  OBJECTIVE There were no vitals filed for this visit. There is no height or weight on file to calculate BMI.  Physical Examination Constitutional: No obvious distress; patient is non-toxic appearing  Cardiovascular: No visible lower extremity edema.  Respiratory: The patient does not have audible wheezing/stridor; respirations do not appear labored  Gastrointestinal: Abdomen non-distended Musculoskeletal: Normal ROM of UEs  Skin: No obvious rashes/open sores  Neurologic: CN 2-12 grossly intact Psychiatric: Answered questions appropriately with normal affect  Hematologic/Lymphatic/Immunologic: No obvious bruises or sites of spontaneous bleeding  UA:  ***positive for *** leukocytes, *** blood, ***nitrites ***Urine microscopy:  ***negative  *** WBC/hpf, *** RBC/hpf, *** bacteria ***with no evidence of UTI ***with no evidence of microscopic hematuria ***otherwise unremarkable ***glucosuria (secondary to ***Jardiance ***Farxiga use)  PVR: *** ml  ASSESSMENT No diagnosis found. ***  We agreed to plan for follow up in *** months / ***1 year or sooner if needed. Patient verbalized understanding of and agreement with current plan. All questions were answered.  PLAN Advised the following: 1. *** 2. ***No follow-ups on file.  No orders of the defined types were placed in this encounter.   It has been explained that the patient is to follow regularly with their PCP in addition to all other providers involved in their care and to follow instructions provided by these respective offices. Patient advised  to contact urology clinic if any urologic-pertaining questions, concerns, new symptoms or problems arise in the interim period.  There are no Patient Instructions on file for this visit.  Electronically signed by:  Donnita Falls, FNP   11/10/23    9:21 AM

## 2023-11-11 ENCOUNTER — Encounter: Payer: Self-pay | Admitting: Urology

## 2023-11-11 ENCOUNTER — Ambulatory Visit (INDEPENDENT_AMBULATORY_CARE_PROVIDER_SITE_OTHER): Payer: Medicare HMO | Admitting: Urology

## 2023-11-11 VITALS — BP 126/77 | HR 62

## 2023-11-11 DIAGNOSIS — R5381 Other malaise: Secondary | ICD-10-CM

## 2023-11-11 DIAGNOSIS — R627 Adult failure to thrive: Secondary | ICD-10-CM

## 2023-11-11 DIAGNOSIS — Z8546 Personal history of malignant neoplasm of prostate: Secondary | ICD-10-CM | POA: Diagnosis not present

## 2023-11-11 DIAGNOSIS — R262 Difficulty in walking, not elsewhere classified: Secondary | ICD-10-CM

## 2023-11-11 DIAGNOSIS — Z09 Encounter for follow-up examination after completed treatment for conditions other than malignant neoplasm: Secondary | ICD-10-CM

## 2023-11-11 DIAGNOSIS — N184 Chronic kidney disease, stage 4 (severe): Secondary | ICD-10-CM

## 2023-11-11 DIAGNOSIS — C61 Malignant neoplasm of prostate: Secondary | ICD-10-CM

## 2023-11-11 DIAGNOSIS — Z978 Presence of other specified devices: Secondary | ICD-10-CM

## 2023-11-11 DIAGNOSIS — R339 Retention of urine, unspecified: Secondary | ICD-10-CM | POA: Diagnosis not present

## 2023-11-11 DIAGNOSIS — N1330 Unspecified hydronephrosis: Secondary | ICD-10-CM | POA: Diagnosis not present

## 2023-11-11 LAB — BLADDER SCAN AMB NON-IMAGING: Scan Result: 69

## 2023-11-11 MED ORDER — TAMSULOSIN HCL 0.4 MG PO CAPS: 0.4000 mg | ORAL_CAPSULE | Freq: Every day | ORAL | 5 refills | Status: DC

## 2023-11-11 MED ORDER — CIPROFLOXACIN HCL 500 MG PO TABS
500.0000 mg | ORAL_TABLET | Freq: Once | ORAL | Status: AC
  Administered 2023-11-11: 500 mg via ORAL

## 2023-11-11 NOTE — Progress Notes (Signed)
Fill and Pull Catheter Removal  Patient is present today for a catheter removal.  280 ml of sterile water was instilled into the bladder when the patient felt the urge to urinate. 10  ml of water was then drained from the balloon.  A 14 FR foley cath was removed from the bladder no complications were noted .  Foley catheter intact and time of removal. Patient as then given some time to void on their own.  Patient can void  200 ml on their own after some time Patient's chuck was wet and patient had PVR to indicated 69 ml seen left in bladder.  Patient tolerated well.  One oral prophylactic antibiotic given per MD orders  Performed by: Kennyth Lose, CMA  Follow up/ Additional notes: Return Monday or Tuesday PVR recheck.

## 2023-11-15 ENCOUNTER — Other Ambulatory Visit (HOSPITAL_COMMUNITY)
Admission: RE | Admit: 2023-11-15 | Discharge: 2023-11-15 | Disposition: A | Discharge status: Home / Self Care | Payer: Medicare HMO | Source: Skilled Nursing Facility | Attending: Adult Health | Admitting: Adult Health

## 2023-11-15 ENCOUNTER — Telehealth: Payer: Self-pay

## 2023-11-15 DIAGNOSIS — I10 Essential (primary) hypertension: Secondary | ICD-10-CM | POA: Insufficient documentation

## 2023-11-15 DIAGNOSIS — R4182 Altered mental status, unspecified: Secondary | ICD-10-CM | POA: Diagnosis not present

## 2023-11-15 DIAGNOSIS — G9340 Encephalopathy, unspecified: Secondary | ICD-10-CM | POA: Diagnosis not present

## 2023-11-15 DIAGNOSIS — N189 Chronic kidney disease, unspecified: Secondary | ICD-10-CM | POA: Diagnosis not present

## 2023-11-15 DIAGNOSIS — G9341 Metabolic encephalopathy: Secondary | ICD-10-CM | POA: Diagnosis not present

## 2023-11-15 DIAGNOSIS — J984 Other disorders of lung: Secondary | ICD-10-CM | POA: Diagnosis not present

## 2023-11-15 DIAGNOSIS — N3001 Acute cystitis with hematuria: Secondary | ICD-10-CM | POA: Diagnosis not present

## 2023-11-15 DIAGNOSIS — I129 Hypertensive chronic kidney disease with stage 1 through stage 4 chronic kidney disease, or unspecified chronic kidney disease: Secondary | ICD-10-CM | POA: Diagnosis not present

## 2023-11-15 DIAGNOSIS — T83518A Infection and inflammatory reaction due to other urinary catheter, initial encounter: Secondary | ICD-10-CM | POA: Diagnosis not present

## 2023-11-15 DIAGNOSIS — R918 Other nonspecific abnormal finding of lung field: Secondary | ICD-10-CM | POA: Diagnosis not present

## 2023-11-15 DIAGNOSIS — I6782 Cerebral ischemia: Secondary | ICD-10-CM | POA: Diagnosis not present

## 2023-11-15 LAB — BASIC METABOLIC PANEL
Anion gap: 8 (ref 5–15)
BUN: 87 mg/dL — ABNORMAL HIGH (ref 8–23)
CO2: 20 mmol/L — ABNORMAL LOW (ref 22–32)
Calcium: 8.7 mg/dL — ABNORMAL LOW (ref 8.9–10.3)
Chloride: 111 mmol/L (ref 98–111)
Creatinine, Ser: 2.48 mg/dL — ABNORMAL HIGH (ref 0.61–1.24)
GFR, Estimated: 24 mL/min — ABNORMAL LOW (ref >60)
Glucose, Bld: 108 mg/dL — ABNORMAL HIGH (ref 70–99)
Potassium: 5.1 mmol/L (ref 3.5–5.1)
Sodium: 139 mmol/L (ref 135–145)

## 2023-11-15 LAB — CBC WITH DIFFERENTIAL/PLATELET
Abs Immature Granulocytes: 0.02 10*3/uL (ref 0.00–0.07)
Basophils Absolute: 0 10*3/uL (ref 0.0–0.1)
Basophils Relative: 0 %
Eosinophils Absolute: 0 10*3/uL (ref 0.0–0.5)
Eosinophils Relative: 1 %
HCT: 29.8 % — ABNORMAL LOW (ref 39.0–52.0)
Hemoglobin: 9.2 g/dL — ABNORMAL LOW (ref 13.0–17.0)
Immature Granulocytes: 1 %
Lymphocytes Relative: 18 %
Lymphs Abs: 0.7 10*3/uL (ref 0.7–4.0)
MCH: 29.8 pg (ref 26.0–34.0)
MCHC: 30.9 g/dL (ref 30.0–36.0)
MCV: 96.4 fL (ref 80.0–100.0)
Monocytes Absolute: 0.2 10*3/uL (ref 0.1–1.0)
Monocytes Relative: 5 %
Neutro Abs: 2.9 10*3/uL (ref 1.7–7.7)
Neutrophils Relative %: 75 %
Platelets: 215 10*3/uL (ref 150–400)
RBC: 3.09 MIL/uL — ABNORMAL LOW (ref 4.22–5.81)
RDW: 19.9 % — ABNORMAL HIGH (ref 11.5–15.5)
WBC: 3.9 10*3/uL — ABNORMAL LOW (ref 4.0–10.5)
nRBC: 0 % (ref 0.0–0.2)

## 2023-11-15 NOTE — Telephone Encounter (Signed)
Patient was sent to ER due to retention to have catheter placed.

## 2023-11-16 ENCOUNTER — Inpatient Hospital Stay (HOSPITAL_COMMUNITY)
Admission: EM | Admit: 2023-11-16 | Discharge: 2023-11-19 | DRG: 698 | Disposition: A | Discharge status: Skilled Nursing Facility | Payer: Medicare HMO | Attending: Family Medicine | Admitting: Family Medicine

## 2023-11-16 ENCOUNTER — Emergency Department (HOSPITAL_COMMUNITY): Payer: Medicare HMO

## 2023-11-16 ENCOUNTER — Encounter (HOSPITAL_COMMUNITY): Payer: Self-pay | Admitting: Emergency Medicine

## 2023-11-16 DIAGNOSIS — Z978 Presence of other specified devices: Secondary | ICD-10-CM

## 2023-11-16 DIAGNOSIS — G9341 Metabolic encephalopathy: Secondary | ICD-10-CM | POA: Diagnosis present

## 2023-11-16 DIAGNOSIS — Z7951 Long term (current) use of inhaled steroids: Secondary | ICD-10-CM

## 2023-11-16 DIAGNOSIS — T83518A Infection and inflammatory reaction due to other urinary catheter, initial encounter: Principal | ICD-10-CM | POA: Diagnosis present

## 2023-11-16 DIAGNOSIS — Z8546 Personal history of malignant neoplasm of prostate: Secondary | ICD-10-CM

## 2023-11-16 DIAGNOSIS — G9340 Encephalopathy, unspecified: Secondary | ICD-10-CM | POA: Diagnosis present

## 2023-11-16 DIAGNOSIS — N1330 Unspecified hydronephrosis: Secondary | ICD-10-CM

## 2023-11-16 DIAGNOSIS — I4510 Unspecified right bundle-branch block: Secondary | ICD-10-CM | POA: Diagnosis present

## 2023-11-16 DIAGNOSIS — C61 Malignant neoplasm of prostate: Secondary | ICD-10-CM

## 2023-11-16 DIAGNOSIS — I10 Essential (primary) hypertension: Secondary | ICD-10-CM | POA: Diagnosis present

## 2023-11-16 DIAGNOSIS — R339 Retention of urine, unspecified: Secondary | ICD-10-CM

## 2023-11-16 DIAGNOSIS — Z09 Encounter for follow-up examination after completed treatment for conditions other than malignant neoplasm: Secondary | ICD-10-CM

## 2023-11-16 DIAGNOSIS — D631 Anemia in chronic kidney disease: Secondary | ICD-10-CM | POA: Diagnosis present

## 2023-11-16 DIAGNOSIS — N189 Chronic kidney disease, unspecified: Secondary | ICD-10-CM | POA: Diagnosis not present

## 2023-11-16 DIAGNOSIS — E039 Hypothyroidism, unspecified: Secondary | ICD-10-CM | POA: Diagnosis not present

## 2023-11-16 DIAGNOSIS — N400 Enlarged prostate without lower urinary tract symptoms: Secondary | ICD-10-CM

## 2023-11-16 DIAGNOSIS — R296 Repeated falls: Secondary | ICD-10-CM | POA: Diagnosis present

## 2023-11-16 DIAGNOSIS — Z66 Do not resuscitate: Secondary | ICD-10-CM | POA: Diagnosis present

## 2023-11-16 DIAGNOSIS — J449 Chronic obstructive pulmonary disease, unspecified: Secondary | ICD-10-CM | POA: Insufficient documentation

## 2023-11-16 DIAGNOSIS — N3001 Acute cystitis with hematuria: Secondary | ICD-10-CM | POA: Diagnosis not present

## 2023-11-16 DIAGNOSIS — Z79890 Hormone replacement therapy (postmenopausal): Secondary | ICD-10-CM

## 2023-11-16 DIAGNOSIS — F32A Depression, unspecified: Secondary | ICD-10-CM | POA: Insufficient documentation

## 2023-11-16 DIAGNOSIS — Z8719 Personal history of other diseases of the digestive system: Secondary | ICD-10-CM

## 2023-11-16 DIAGNOSIS — I130 Hypertensive heart and chronic kidney disease with heart failure and stage 1 through stage 4 chronic kidney disease, or unspecified chronic kidney disease: Secondary | ICD-10-CM | POA: Diagnosis present

## 2023-11-16 DIAGNOSIS — N281 Cyst of kidney, acquired: Secondary | ICD-10-CM | POA: Diagnosis present

## 2023-11-16 DIAGNOSIS — F419 Anxiety disorder, unspecified: Secondary | ICD-10-CM | POA: Diagnosis present

## 2023-11-16 DIAGNOSIS — N184 Chronic kidney disease, stage 4 (severe): Secondary | ICD-10-CM | POA: Diagnosis present

## 2023-11-16 DIAGNOSIS — Z9981 Dependence on supplemental oxygen: Secondary | ICD-10-CM

## 2023-11-16 DIAGNOSIS — Z87891 Personal history of nicotine dependence: Secondary | ICD-10-CM

## 2023-11-16 DIAGNOSIS — I5032 Chronic diastolic (congestive) heart failure: Secondary | ICD-10-CM | POA: Diagnosis not present

## 2023-11-16 DIAGNOSIS — R627 Adult failure to thrive: Secondary | ICD-10-CM

## 2023-11-16 DIAGNOSIS — R5381 Other malaise: Secondary | ICD-10-CM

## 2023-11-16 DIAGNOSIS — N390 Urinary tract infection, site not specified: Secondary | ICD-10-CM | POA: Diagnosis not present

## 2023-11-16 DIAGNOSIS — Y846 Urinary catheterization as the cause of abnormal reaction of the patient, or of later complication, without mention of misadventure at the time of the procedure: Secondary | ICD-10-CM | POA: Diagnosis present

## 2023-11-16 DIAGNOSIS — Z886 Allergy status to analgesic agent status: Secondary | ICD-10-CM

## 2023-11-16 DIAGNOSIS — Z79899 Other long term (current) drug therapy: Secondary | ICD-10-CM

## 2023-11-16 DIAGNOSIS — N401 Enlarged prostate with lower urinary tract symptoms: Secondary | ICD-10-CM | POA: Diagnosis present

## 2023-11-16 DIAGNOSIS — R262 Difficulty in walking, not elsewhere classified: Secondary | ICD-10-CM

## 2023-11-16 DIAGNOSIS — I129 Hypertensive chronic kidney disease with stage 1 through stage 4 chronic kidney disease, or unspecified chronic kidney disease: Secondary | ICD-10-CM | POA: Diagnosis not present

## 2023-11-16 LAB — COMPREHENSIVE METABOLIC PANEL
ALT: 32 U/L (ref 0–44)
AST: 24 U/L (ref 15–41)
Albumin: 3.2 g/dL — ABNORMAL LOW (ref 3.5–5.0)
Alkaline Phosphatase: 75 U/L (ref 38–126)
Anion gap: 9 (ref 5–15)
BUN: 84 mg/dL — ABNORMAL HIGH (ref 8–23)
CO2: 18 mmol/L — ABNORMAL LOW (ref 22–32)
Calcium: 8.5 mg/dL — ABNORMAL LOW (ref 8.9–10.3)
Chloride: 111 mmol/L (ref 98–111)
Creatinine, Ser: 2.44 mg/dL — ABNORMAL HIGH (ref 0.61–1.24)
GFR, Estimated: 25 mL/min — ABNORMAL LOW (ref >60)
Glucose, Bld: 101 mg/dL — ABNORMAL HIGH (ref 70–99)
Potassium: 5 mmol/L (ref 3.5–5.1)
Sodium: 138 mmol/L (ref 135–145)
Total Bilirubin: 0.8 mg/dL (ref 0.0–1.2)
Total Protein: 7.4 g/dL (ref 6.5–8.1)

## 2023-11-16 LAB — CBC WITH DIFFERENTIAL/PLATELET
Abs Immature Granulocytes: 0.01 10*3/uL (ref 0.00–0.07)
Basophils Absolute: 0 10*3/uL (ref 0.0–0.1)
Basophils Relative: 0 %
Eosinophils Absolute: 0 10*3/uL (ref 0.0–0.5)
Eosinophils Relative: 0 %
HCT: 30.3 % — ABNORMAL LOW (ref 39.0–52.0)
Hemoglobin: 9.4 g/dL — ABNORMAL LOW (ref 13.0–17.0)
Immature Granulocytes: 0 %
Lymphocytes Relative: 23 %
Lymphs Abs: 1.1 10*3/uL (ref 0.7–4.0)
MCH: 29.7 pg (ref 26.0–34.0)
MCHC: 31 g/dL (ref 30.0–36.0)
MCV: 95.6 fL (ref 80.0–100.0)
Monocytes Absolute: 0.4 10*3/uL (ref 0.1–1.0)
Monocytes Relative: 9 %
Neutro Abs: 3.2 10*3/uL (ref 1.7–7.7)
Neutrophils Relative %: 68 %
Platelets: 203 10*3/uL (ref 150–400)
RBC: 3.17 MIL/uL — ABNORMAL LOW (ref 4.22–5.81)
RDW: 20.1 % — ABNORMAL HIGH (ref 11.5–15.5)
WBC: 4.7 10*3/uL (ref 4.0–10.5)
nRBC: 0 % (ref 0.0–0.2)

## 2023-11-16 LAB — URINALYSIS, ROUTINE W REFLEX MICROSCOPIC
Bilirubin Urine: NEGATIVE
Glucose, UA: NEGATIVE mg/dL
Ketones, ur: NEGATIVE mg/dL
Nitrite: NEGATIVE
Protein, ur: 100 mg/dL — AB
RBC / HPF: >50 RBC/hpf (ref 0–5)
Specific Gravity, Urine: 1.012 (ref 1.005–1.030)
WBC, UA: >50 WBC/hpf (ref 0–5)
pH: 5 (ref 5.0–8.0)

## 2023-11-16 LAB — LACTIC ACID, PLASMA: Lactic Acid, Venous: 0.9 mmol/L (ref 0.5–1.9)

## 2023-11-16 MED ORDER — SODIUM CHLORIDE 0.9 % IV SOLN
1.0000 g | Freq: Once | INTRAVENOUS | Status: AC
  Administered 2023-11-16: 1 g via INTRAVENOUS
  Filled 2023-11-16: qty 10

## 2023-11-16 MED ORDER — SODIUM CHLORIDE 0.9 % IV SOLN: INTRAVENOUS | Status: AC

## 2023-11-16 MED ORDER — SODIUM CHLORIDE 0.9 % IV SOLN
1.0000 g | INTRAVENOUS | Status: DC
  Administered 2023-11-17 – 2023-11-18 (×2): 1 g via INTRAVENOUS
  Filled 2023-11-16 (×2): qty 10

## 2023-11-16 MED ORDER — FERROUS SULFATE 325 (65 FE) MG PO TABS
325.0000 mg | ORAL_TABLET | ORAL | Status: DC
  Administered 2023-11-17 – 2023-11-19 (×2): 325 mg via ORAL
  Filled 2023-11-16 (×2): qty 1

## 2023-11-16 MED ORDER — QUETIAPINE FUMARATE 25 MG PO TABS
12.5000 mg | ORAL_TABLET | Freq: Every day | ORAL | Status: DC
  Administered 2023-11-17 – 2023-11-18 (×2): 12.5 mg via ORAL
  Filled 2023-11-16 (×2): qty 1

## 2023-11-16 MED ORDER — POLYETHYLENE GLYCOL 3350 17 G PO PACK: 17.0000 g | PACK | Freq: Every day | ORAL | Status: DC

## 2023-11-16 MED ORDER — BUPROPION HCL 100 MG PO TABS
100.0000 mg | ORAL_TABLET | Freq: Two times a day (BID) | ORAL | Status: DC
  Administered 2023-11-17 – 2023-11-19 (×5): 100 mg via ORAL
  Filled 2023-11-16 (×13): qty 1

## 2023-11-16 MED ORDER — LORAZEPAM 0.5 MG PO TABS: 0.5000 mg | ORAL_TABLET | Freq: Three times a day (TID) | ORAL | Status: DC | PRN

## 2023-11-16 MED ORDER — MOMETASONE FURO-FORMOTEROL FUM 200-5 MCG/ACT IN AERO
2.0000 | INHALATION_SPRAY | Freq: Two times a day (BID) | RESPIRATORY_TRACT | Status: DC
  Administered 2023-11-17 – 2023-11-19 (×5): 2 via RESPIRATORY_TRACT
  Filled 2023-11-16 (×2): qty 8.8

## 2023-11-16 MED ORDER — CALCIUM CARBONATE ANTACID 500 MG PO CHEW
500.0000 mg | CHEWABLE_TABLET | Freq: Every day | ORAL | Status: DC
  Administered 2023-11-17 – 2023-11-19 (×3): 200 mg via ORAL
  Filled 2023-11-16 (×3): qty 1

## 2023-11-16 MED ORDER — TRAZODONE HCL 50 MG PO TABS: 25.0000 mg | ORAL_TABLET | Freq: Every evening | ORAL | Status: DC | PRN

## 2023-11-16 MED ORDER — AMLODIPINE BESYLATE 5 MG PO TABS
10.0000 mg | ORAL_TABLET | Freq: Every day | ORAL | Status: DC
  Administered 2023-11-17 – 2023-11-19 (×3): 10 mg via ORAL
  Filled 2023-11-16 (×3): qty 2

## 2023-11-16 MED ORDER — ENOXAPARIN SODIUM 30 MG/0.3ML IJ SOSY
30.0000 mg | PREFILLED_SYRINGE | INTRAMUSCULAR | Status: DC
  Administered 2023-11-17 – 2023-11-19 (×3): 30 mg via SUBCUTANEOUS
  Filled 2023-11-16 (×3): qty 0.3

## 2023-11-16 MED ORDER — SODIUM CHLORIDE 0.9 % IV BOLUS
500.0000 mL | Freq: Once | INTRAVENOUS | Status: AC
  Administered 2023-11-16: 500 mL via INTRAVENOUS

## 2023-11-16 MED ORDER — ACETAMINOPHEN 650 MG RE SUPP: 650.0000 mg | Freq: Four times a day (QID) | RECTAL | Status: DC | PRN

## 2023-11-16 MED ORDER — TAMSULOSIN HCL 0.4 MG PO CAPS: 0.4000 mg | ORAL_CAPSULE | Freq: Every day | ORAL | Status: DC

## 2023-11-16 MED ORDER — UMECLIDINIUM BROMIDE 62.5 MCG/ACT IN AEPB
1.0000 | INHALATION_SPRAY | Freq: Every day | RESPIRATORY_TRACT | Status: DC
  Administered 2023-11-17 – 2023-11-19 (×3): 1 via RESPIRATORY_TRACT
  Filled 2023-11-16 (×2): qty 7

## 2023-11-16 MED ORDER — MAGNESIUM HYDROXIDE 400 MG/5ML PO SUSP: 30.0000 mL | Freq: Every day | ORAL | Status: DC | PRN

## 2023-11-16 MED ORDER — LABETALOL HCL 200 MG PO TABS
200.0000 mg | ORAL_TABLET | Freq: Two times a day (BID) | ORAL | Status: DC
  Administered 2023-11-17 – 2023-11-19 (×5): 200 mg via ORAL
  Filled 2023-11-16 (×5): qty 1

## 2023-11-16 MED ORDER — LEVOTHYROXINE SODIUM 75 MCG PO TABS
150.0000 ug | ORAL_TABLET | Freq: Every day | ORAL | Status: DC
  Administered 2023-11-17 – 2023-11-19 (×3): 150 ug via ORAL
  Filled 2023-11-16 (×2): qty 2
  Filled 2023-11-16: qty 3

## 2023-11-16 MED ORDER — METOCLOPRAMIDE HCL 5 MG/ML IJ SOLN: 5.0000 mg | Freq: Four times a day (QID) | INTRAMUSCULAR | Status: DC | PRN

## 2023-11-16 MED ORDER — GABAPENTIN 250 MG/5ML PO SOLN
150.0000 mg | Freq: Every day | ORAL | Status: DC
  Administered 2023-11-18: 150 mg via ORAL
  Filled 2023-11-16 (×6): qty 3

## 2023-11-16 MED ORDER — PROSOURCE PLUS PO LIQD
30.0000 mL | Freq: Three times a day (TID) | ORAL | Status: DC
Start: 2023-11-17 — End: 2023-11-19
  Administered 2023-11-17 – 2023-11-19 (×6): 30 mL via ORAL
  Filled 2023-11-16 (×12): qty 30

## 2023-11-16 MED ORDER — SENNOSIDES-DOCUSATE SODIUM 8.6-50 MG PO TABS: 1.0000 | ORAL_TABLET | Freq: Two times a day (BID) | ORAL | Status: DC

## 2023-11-16 MED ORDER — MELATONIN 3 MG PO TABS
6.0000 mg | ORAL_TABLET | Freq: Every day | ORAL | Status: DC
  Administered 2023-11-17 – 2023-11-18 (×2): 6 mg via ORAL
  Filled 2023-11-16 (×2): qty 2

## 2023-11-16 MED ORDER — FUROSEMIDE 40 MG PO TABS: 40.0000 mg | ORAL_TABLET | Freq: Every day | ORAL | Status: DC

## 2023-11-16 MED ORDER — ACETAMINOPHEN 325 MG PO TABS
650.0000 mg | ORAL_TABLET | Freq: Four times a day (QID) | ORAL | Status: DC | PRN
  Administered 2023-11-17: 650 mg via ORAL
  Filled 2023-11-16: qty 2

## 2023-11-16 MED ORDER — PROSOURCE PO LIQD: 30.0000 mL | Freq: Three times a day (TID) | ORAL | Status: DC

## 2023-11-16 NOTE — Assessment & Plan Note (Signed)
>>  ASSESSMENT AND PLAN FOR BPH (BENIGN PROSTATIC HYPERPLASIA) WRITTEN ON 11/16/2023 10:55 PM BY MANSY, JAN A, MD  - We will continue Flomax.

## 2023-11-16 NOTE — Assessment & Plan Note (Signed)
-   We will continue Synthroid and check TSH level.

## 2023-11-16 NOTE — H&P (Signed)
[place]   PATIENT NAME: [name]    MR#:  [id number]  DATE OF BIRTH:  [birth year]  DATE OF ADMISSION:  11/16/2023  PRIMARY CARE PHYSICIAN: [name], MD   Patient is coming from: SNF  REQUESTING/REFERRING PHYSICIAN: Jacalyn Lefevre, MD  CHIEF COMPLAINT:   Chief Complaint  Patient presents with   Altered Mental Status    HISTORY OF PRESENT ILLNESS:  [name] is a 89 y.o. male with medical history significant for essential hypertension, hypothyroidism, lung cancer, stage IV CKD, osteoarthritis and chronic respiratory failure on home O2 at 2 LPM by [place], who presented to the emergency room with acute onset of altered mental status with confusion as well as generalized weakness at his SNF.  He has had several falls since Sunday, 11/14/2023.  Had an episode of unresponsiveness today.  No reported fever or chills.  No reported nausea or vomiting or abdominal pain.  He has a chronic indwelling Foley catheter due to chronic retention. No chest pain or palpitations.  No cough or wheezing or dyspnea.  No bleeding diathesis.  ED Course: When he came to the ER BP was 158/81 otherwise normal vital signs.  Respiratory rate was later on 25.  Labs revealed a BUN of 84 and creatinine is 2.4 close to previous levels consistent with stage IV chronic kidney disease.  CBC showed anemia with hemoglobin 9.4 hematocrit 30.3 close to his baseline.  UA was positive for UTI and urine culture will be sent. EKG as reviewed by me : Sinus rhythm with a rate of 78 with prolonged PR interval, right bundle branch block and left intrafascicular block and QTc of 462 MS. Imaging: Noncontrasted head CT scan revealed no acute intracranial normalities.  It showed age-related atrophy and chronic small vessel ischemic changes of the hemispheric white matter.  Portable chest x-ray showed no acute cardiopulmonary disease.  It showed chronic lung disease with emphysematous changes and scarring that is more  advanced on the right than the left.  The patient had his Foley catheter replaced in the ER.  He will be admitted to a medical telemetry observation bed for further evaluation and management. PAST MEDICAL HISTORY:   Past Medical History:  Diagnosis Date   Arthritis    Hypertension    Hyperthyroidism    lung ca 11/2020  -Hypothyroidism  PAST SURGICAL HISTORY:   Past Surgical History:  Procedure Laterality Date   COLONOSCOPY N/A 09/02/2016   Procedure: COLONOSCOPY;  Surgeon: [name], MD;  [facility];  Service: Endoscopy;  Laterality: N/A;  830   NO PAST SURGERIES     POLYPECTOMY  09/02/2016   Procedure: POLYPECTOMY;  Surgeon: [name], MD;  [facility];  Service: Endoscopy;;    SOCIAL HISTORY:   Social History   Tobacco Use   Smoking status: Former    Current packs/day: 0.00    Average packs/day: 1.5 packs/day for 40.0 years (60.0 ttl pk-yrs)    Types: Cigarettes    Start date: 75    Quit date: 2000    Years since quitting: 25.1   Smokeless tobacco: Never  Substance Use Topics   Alcohol use: No    FAMILY HISTORY:   Family History  Problem Relation Age of Onset   Colon cancer Other     DRUG ALLERGIES:   Allergies  Allergen Reactions   Nsaids Other (See Comments)    Decreased renal function    REVIEW OF  SYSTEMS:   ROS As per history of present illness. All pertinent systems were reviewed above. Constitutional, HEENT, cardiovascular, respiratory, GI, GU, musculoskeletal, neuro, psychiatric, endocrine, integumentary and hematologic systems were reviewed and are otherwise negative/unremarkable except for positive findings mentioned above in the HPI.   MEDICATIONS AT HOME:   Prior to Admission medications   Medication Sig Start Date End Date Taking? Authorizing Provider  acetaminophen (TYLENOL) 325 MG tablet Take 650 mg by mouth every 6 (six) hours as needed for mild pain (pain score 1-3).   Yes [provider]   amLODipine (NORVASC) 10 MG tablet Take 1 tablet (10 mg total) by mouth daily. Hold for blood pressure reading <130 09/01/23  Yes [name], NP  buPROPion (WELLBUTRIN) 100 MG tablet Take 100 mg by mouth 2 (two) times daily.   Yes [provider]  Calcium Carbonate Antacid 200 MG TBDP Take 200 mg by mouth daily.   Yes [provider]  ferrous sulfate 325 (65 FE) MG EC tablet Take 1 tablet (325 mg total) by mouth 3 (three) times a week. Patient taking differently: Take 325 mg by mouth 3 (three) times a week. M,W,F 09/01/23  Yes [name], NP  fluticasone-salmeterol (ADVAIR) 250-50 MCG/ACT AEPB Inhale 1 puff into the lungs in the morning and at bedtime. 09/01/23  Yes [name], NP  gabapentin (NEURONTIN) 250 MG/5ML solution Take 3 mLs (150 mg total) by mouth at bedtime. 09/01/23  Yes [name], NP  labetalol (NORMODYNE) 200 MG tablet Take 1 tablet (200 mg total) by mouth 2 (two) times daily. Hold for blood pressure reading <130 09/01/23  Yes [name], NP  levothyroxine (SYNTHROID) 150 MCG tablet Take 1 tablet (150 mcg total) by mouth daily. 09/01/23  Yes [name], NP  melatonin 5 MG TABS Take 5 mg by mouth at bedtime.   Yes [provider]  Nutritional Supplements (PROSOURCE) LIQD Take 30 mLs by mouth 3 (three) times daily.   Yes [provider]  polyethylene glycol (MIRALAX / GLYCOLAX) 17 g packet Take 17 g by mouth daily. 10/30/23  Yes [place], Eric J, DO  QUEtiapine (SEROQUEL) 25 MG tablet Take 0.5 tablets (12.5 mg total) by mouth at bedtime. 10/29/23  Yes [place], Eric J, DO  senna-docusate (SENOKOT-S) 8.6-50 MG tablet Take 1 tablet by mouth 2 (two) times daily. 10/29/23  Yes [place], Alvira Philips, DO  tamsulosin (FLOMAX) 0.4 MG CAPS capsule Take 1 capsule (0.4 mg total) by mouth daily. 11/11/23  Yes Donnita Falls, FNP  umeclidinium bromide (INCRUSE ELLIPTA) 62.5 MCG/ACT AEPB Inhale 1 puff into the lungs daily.   Yes [provider]  LORazepam (ATIVAN) 0.5 MG tablet Take 1 tablet (0.5 mg total) by mouth every 8 (eight) hours as needed (agitation). 10/29/23   [place], Alvira Philips, DO      VITAL SIGNS:  Blood pressure (!) 158/81, pulse 80, temperature 98.3 F (36.8 C), temperature source Oral, resp. rate 13, height 6\' 2"  (1.88 m), weight 59 kg, SpO2 100%.  PHYSICAL EXAMINATION:  Physical Exam  GENERAL:  89 y.o.-year-old male patient lying in the bed with no acute distress.  He was restless and lethargic.Marland Kitchen  EYES: Pupils equal, round, reactive to light and accommodation. No scleral icterus. Extraocular muscles intact.  HEENT: Head atraumatic, normocephalic. Oropharynx and nasopharynx clear.  NECK:  Supple, no jugular venous distention. No thyroid enlargement, no tenderness.  LUNGS: Normal breath sounds bilaterally, no wheezing, rales,rhonchi or crepitation. No use  of accessory muscles of respiration.  CARDIOVASCULAR: Regular rate and rhythm, S1, S2 normal. No murmurs, rubs, or gallops.  ABDOMEN: Soft, nondistended, nontender. Bowel sounds present. No organomegaly or mass.  EXTREMITIES: No pedal edema, cyanosis, or clubbing.  NEUROLOGIC: Cranial nerves II through XII are intact. Muscle strength 5/5 in all extremities. Sensation intact. Gait not checked.  PSYCHIATRIC: The patient is alert and oriented x 3.  Normal affect and good eye contact. SKIN: He has diffuse skin hypopigmentation that reportedly occurred after previous chemotherapy for his lung cancer according to his family. LABORATORY PANEL:   CBC Recent Labs  Lab 11/16/23 1922  WBC 4.7  HGB 9.4*  HCT 30.3*  PLT 203   ------------------------------------------------------------------------------------------------------------------  Chemistries  Recent Labs  Lab 11/16/23 1922  NA 138  K 5.0  CL 111  CO2 18*  GLUCOSE 101*  BUN 84*  CREATININE 2.44*  CALCIUM 8.5*  AST 24  ALT 32  ALKPHOS 75  BILITOT 0.8    ------------------------------------------------------------------------------------------------------------------  Cardiac Enzymes No results for input(s): "TROPONINI" in the last 168 hours. ------------------------------------------------------------------------------------------------------------------  RADIOLOGY:  DG Chest Portable 1 View Result Date: 11/16/2023 CLINICAL DATA:  Altered mental status EXAM: PORTABLE CHEST 1 VIEW COMPARISON:  10/25/2023 FINDINGS: Heart size remains upper limits of normal. 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 No sign of acute infiltrate, edema, collapse or effusion. No acute bone finding. IMPRESSION: No active disease. 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 Electronically Signed   By: Paulina Fusi M.D.   On: 11/16/2023 19:38   CT Head Wo Contrast Result Date: 11/16/2023 CLINICAL DATA:  Mental status change of unknown cause. Multiple recent falls. EXAM: CT HEAD WITHOUT CONTRAST TECHNIQUE: Contiguous axial images were obtained from the base of the skull through the vertex without intravenous contrast. RADIATION DOSE REDUCTION: This exam was performed according to the departmental dose-optimization program which includes automated exposure control, adjustment of the mA and/or kV according to patient size and/or use of iterative reconstruction technique. COMPARISON:  10/25/2023 FINDINGS: Brain: No acute finding. Age related atrophy. Chronic small-vessel ischemic changes of the hemispheric white matter. No acute stroke, mass, hemorrhage, hydrocephalus or extra-axial collection. Vascular: There is atherosclerotic calcification of the major vessels at the base of the brain. Skull: Negative Sinuses/Orbits: Clear/normal Other: None IMPRESSION: No acute CT finding. Age related atrophy. Chronic small-vessel ischemic changes of the hemispheric white  matter. Electronically Signed   By: Paulina Fusi M.D.   On: 11/16/2023 19:37      IMPRESSION AND PLAN:  Assessment and Plan: * Acute encephalopathy - This associated with confusion and generalized weakness. - It is likely secondary to his acute lower UTI. - We will follow neurochecks every 4 hours for 24 hours. - We will monitor his mental status. - Management otherwise as below.  Acute lower UTI - We will continue the patient on IV Rocephin and follow serial culture and sensitivity.  Chronic diastolic CHF (congestive heart failure) (HCC) - It is currently compensated.   Essential hypertension - Will continue antihypertensive therapy.  Hypothyroidism - We will continue Synthroid and check TSH level  Chronic obstructive pulmonary disease (COPD) (HCC) - We will continue his inhalers.  BPH (benign prostatic hyperplasia) - We will continue Flomax.  Anxiety and depression - We will continue Ativan and Wellbutrin XR as well as Seroquel.   DVT prophylaxis: Lovenox.  Advanced Care Planning:  Code Status:  The patient is DNR and DNI. Family Communication:  The plan of care was discussed in details with the patient (and family). I answered all questions. The patient agreed to proceed with the above mentioned plan. Further management will depend upon hospital course. Disposition Plan: Back to previous home environment Consults called: none.  All the records are reviewed and case discussed with ED provider.  Status is: Observation  I certify that at the time of admission, it is my clinical judgment that the patient will require hospital care extending less than 2 midnights.                            Dispo: The patient is from: Home              Anticipated d/c is to: Home              Patient currently is not medically stable to d/c.              Difficult to place patient: No  Hannah Beat M.D on 11/16/2023 at 10:58 PM  [facility]   From 7 PM-7 AM, contact  night-coverage [email or web address]  CC: Primary care physician; [name], MD

## 2023-11-16 NOTE — ED Notes (Signed)
 ED Provider at bedside.

## 2023-11-16 NOTE — Assessment & Plan Note (Signed)
-  We will continue his inhalers.

## 2023-11-16 NOTE — Assessment & Plan Note (Signed)
-   This associated with confusion and generalized weakness. - It is likely secondary to his acute lower UTI. - We will follow neurochecks every 4 hours for 24 hours. - We will monitor his mental status. - Management otherwise as below.

## 2023-11-16 NOTE — ED Provider Notes (Signed)
[facility] DEPARTMENT AT [facility] Provider Note   CSN: [id number] Arrival date & time: 11/16/23  1834     History  Chief Complaint  Patient presents with   Altered Mental Status    [name] is a 89 y.o. male.  Pt is a 89 yo male with pmhx significant for lung cancer, htn, ckd, prostate cancer, arthritis, and hyperthyroidism.  Pt has fallen several times since Sunday, 2/16.  He has been more altered and had an episode of unresponsive ness today.  Pt said he feels fine.       Home Medications Prior to Admission medications   Medication Sig Start Date End Date Taking? Authorizing Provider  acetaminophen (TYLENOL) 325 MG tablet Take 650 mg by mouth every 6 (six) hours as needed for mild pain (pain score 1-3).   Yes [provider]  amLODipine (NORVASC) 10 MG tablet Take 1 tablet (10 mg total) by mouth daily. Hold for blood pressure reading <130 09/01/23  Yes [name], NP  buPROPion (WELLBUTRIN) 100 MG tablet Take 100 mg by mouth 2 (two) times daily.   Yes [provider]  Calcium Carbonate Antacid 200 MG TBDP Take 200 mg by mouth daily.   Yes [provider]  ferrous sulfate 325 (65 FE) MG EC tablet Take 1 tablet (325 mg total) by mouth 3 (three) times a week. Patient taking differently: Take 325 mg by mouth 3 (three) times a week. M,W,F 09/01/23  Yes [name], NP  fluticasone-salmeterol (ADVAIR) 250-50 MCG/ACT AEPB Inhale 1 puff into the lungs in the morning and at bedtime. 09/01/23  Yes [name], NP  gabapentin (NEURONTIN) 250 MG/5ML solution Take 3 mLs (150 mg total) by mouth at bedtime. 09/01/23  Yes [name], NP  labetalol (NORMODYNE) 200 MG tablet Take 1 tablet (200 mg total) by mouth 2 (two) times daily. Hold for blood pressure reading <130 09/01/23  Yes [name], NP  levothyroxine (SYNTHROID) 150 MCG tablet Take 1 tablet (150 mcg total) by mouth daily. 09/01/23  Yes [name], NP   melatonin 5 MG TABS Take 5 mg by mouth at bedtime.   Yes [provider]  Nutritional Supplements (PROSOURCE) LIQD Take 30 mLs by mouth 3 (three) times daily.   Yes [provider]  polyethylene glycol (MIRALAX / GLYCOLAX) 17 g packet Take 17 g by mouth daily. 10/30/23  Yes [place], Eric J, DO  QUEtiapine (SEROQUEL) 25 MG tablet Take 0.5 tablets (12.5 mg total) by mouth at bedtime. 10/29/23  Yes [place], Eric J, DO  senna-docusate (SENOKOT-S) 8.6-50 MG tablet Take 1 tablet by mouth 2 (two) times daily. 10/29/23  Yes [place], Alvira Philips, DO  tamsulosin (FLOMAX) 0.4 MG CAPS capsule Take 1 capsule (0.4 mg total) by mouth daily. 11/11/23  Yes Donnita Falls, FNP  umeclidinium bromide (INCRUSE ELLIPTA) 62.5 MCG/ACT AEPB Inhale 1 puff into the lungs daily.   Yes [provider]  LORazepam (ATIVAN) 0.5 MG tablet Take 1 tablet (0.5 mg total) by mouth every 8 (eight) hours as needed (agitation). 10/29/23   [place], Eric J, DO      Allergies    Nsaids    Review of Systems   Review of Systems  All other systems reviewed and are negative.   Physical Exam Updated Vital Signs BP (!) 158/81   Pulse 80   Temp 98.3 F (36.8 C) (Oral)   Resp 13   Ht 6\' 2"  (  1.88 m)   Wt 59 kg   SpO2 100%   BMI 16.70 kg/m  Physical Exam Vitals and nursing note reviewed.  Constitutional:      Appearance: Normal appearance.  HENT:     Head: Normocephalic and atraumatic.     Right Ear: External ear normal.     Left Ear: External ear normal.     Nose: Nose normal.     Mouth/Throat:     Mouth: Mucous membranes are dry.  Eyes:     Extraocular Movements: Extraocular movements intact.     Conjunctiva/sclera: Conjunctivae normal.     Pupils: Pupils are equal, round, and reactive to light.  Cardiovascular:     Rate and Rhythm: Normal rate and regular rhythm.     Pulses: Normal pulses.     Heart sounds: Normal heart sounds.  Pulmonary:     Effort: Pulmonary effort is normal.     Breath  sounds: Normal breath sounds.  Abdominal:     General: Abdomen is flat. Bowel sounds are normal.     Palpations: Abdomen is soft.  Genitourinary:    Comments: Indwelling foley Musculoskeletal:        General: Normal range of motion.     Cervical back: Normal range of motion and neck supple.  Skin:    General: Skin is warm.     Capillary Refill: Capillary refill takes less than 2 seconds.     Comments: Changes c/w vitiligo  Neurological:     General: No focal deficit present.     Mental Status: He is alert. He is disoriented.  Psychiatric:        Mood and Affect: Mood normal.        Behavior: Behavior normal.     ED Results / Procedures / Treatments   Labs (all labs ordered are listed, but only abnormal results are displayed) Labs Reviewed  CBC WITH DIFFERENTIAL/PLATELET - Abnormal; Notable for the following components:      Result Value   RBC 3.17 (*)    Hemoglobin 9.4 (*)    HCT 30.3 (*)    RDW 20.1 (*)    All other components within normal limits  COMPREHENSIVE METABOLIC PANEL - Abnormal; Notable for the following components:   CO2 18 (*)    Glucose, Bld 101 (*)    BUN 84 (*)    Creatinine, Ser 2.44 (*)    Calcium 8.5 (*)    Albumin 3.2 (*)    GFR, Estimated 25 (*)    All other components within normal limits  URINALYSIS, ROUTINE W REFLEX MICROSCOPIC - Abnormal; Notable for the following components:   APPearance HAZY (*)    Hgb urine dipstick LARGE (*)    Protein, ur 100 (*)    Leukocytes,Ua MODERATE (*)    Bacteria, UA RARE (*)    All other components within normal limits  URINE CULTURE  LACTIC ACID, PLASMA  CBG MONITORING, ED    EKG EKG Interpretation Date/Time:  Tuesday November 16 2023 18:46:57 EST Ventricular Rate:  78 PR Interval:  339 QRS Duration:  145 QT Interval:  405 QTC Calculation: 462 R Axis:   253  Text Interpretation: Sinus rhythm Prolonged PR interval RBBB and LAFB No significant change since last tracing Confirmed by Jacalyn Lefevre  [phone]) on 11/16/2023 6:59:30 PM  Radiology DG Chest Portable 1 View Result Date: 11/16/2023 CLINICAL DATA:  Altered mental status EXAM: PORTABLE CHEST 1 VIEW COMPARISON:  10/25/2023 FINDINGS: Heart size remains upper limits of  normal. 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 No sign of acute infiltrate, edema, collapse or effusion. No acute bone finding. IMPRESSION: No active disease. 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 Electronically Signed   By: Paulina Fusi M.D.   On: 11/16/2023 19:38   CT Head Wo Contrast Result Date: 11/16/2023 CLINICAL DATA:  Mental status change of unknown cause. Multiple recent falls. EXAM: CT HEAD WITHOUT CONTRAST TECHNIQUE: Contiguous axial images were obtained from the base of the skull through the vertex without intravenous contrast. RADIATION DOSE REDUCTION: This exam was performed according to the departmental dose-optimization program which includes automated exposure control, adjustment of the mA and/or kV according to patient size and/or use of iterative reconstruction technique. COMPARISON:  10/25/2023 FINDINGS: Brain: No acute finding. Age related atrophy. Chronic small-vessel ischemic changes of the hemispheric white matter. No acute stroke, mass, hemorrhage, hydrocephalus or extra-axial collection. Vascular: There is atherosclerotic calcification of the major vessels at the base of the brain. Skull: Negative Sinuses/Orbits: Clear/normal Other: None IMPRESSION: No acute CT finding. Age related atrophy. Chronic small-vessel ischemic changes of the hemispheric white matter. Electronically Signed   By: Paulina Fusi M.D.   On: 11/16/2023 19:37    Procedures Procedures    Medications Ordered in ED Medications  cefTRIAXone (ROCEPHIN) 1 g in sodium chloride 0.9 % 100 mL IVPB (1 g Intravenous New Bag/Given 11/16/23 2220)  sodium chloride 0.9 %  bolus 500 mL (0 mLs Intravenous Stopped 11/16/23 2138)    ED Course/ Medical Decision Making/ A&P                                 Medical Decision Making Amount and/or Complexity of Data Reviewed Labs: ordered. Radiology: ordered.  Risk Decision regarding hospitalization.   This patient presents to the ED for concern of ams, this involves an extensive number of treatment options, and is a complaint that carries with it a high risk of complications and morbidity.  The differential diagnosis includes infection, electrolyte abn, anemia, cva   Co morbidities that complicate the patient evaluation  lung cancer, htn, ckd, prostate cancer, arthritis, and hyperthyroidism   Additional history obtained:  Additional history obtained from epic chart review External records from outside source obtained and reviewed including EMS report   Lab Tests:  I Ordered, and personally interpreted labs.  The pertinent results include:  cbc with hgb 9.4 (stable), cmp with bun 84 and cr 2.44 (stable); ua + moderate leukocytes, rbc >50 and wbc >50   Imaging Studies ordered:  I ordered imaging studies including cxr and ct  I independently visualized and interpreted imaging which showed  CXR: No active disease. Chronic lung disease with emphysematous changes  and scarring, more advanced on the right than the left.   CT head: No acute CT finding. Age related atrophy. Chronic small-vessel  ischemic changes of the hemispheric white matter.   I agree with the radiologist interpretation   Cardiac Monitoring:  The patient was maintained on a cardiac monitor.  I personally viewed and interpreted the cardiac monitored which showed an underlying rhythm of: nsr   Medicines ordered and prescription drug management:  I ordered medication including rocephin  for uti  Reevaluation of the patient after these medicines showed that the patient improved I have reviewed the patients home medicines and have  made adjustments as needed   Test Considered:  ct   Critical Interventions:  abx   Consultations Obtained:  I requested consultation with the hospitalist (Dr. Arville Care),  and discussed lab and imaging findings as well as pertinent plan -he will admit   Problem List / ED Course:  UTI:  foley changed and urine sent from new foley.  Urine sent for cx.  Rocephin given.   Metabolic encephalopathy:  likely from uti   Reevaluation:  After the interventions noted above, I reevaluated the patient and found that they have :improved   Social Determinants of Health:  Lives in a facility   Dispostion:  After consideration of the diagnostic results and the patients response to treatment, I feel that the patent would benefit from admission.          Final Clinical Impression(s) / ED Diagnoses Final diagnoses:  Acute cystitis with hematuria  Metabolic encephalopathy  Indwelling Foley catheter present  CKD (chronic kidney disease) stage 4, GFR 15-29 ml/min (HCC)  Anemia due to stage 4 chronic kidney disease (HCC)    Rx / DC Orders ED Discharge Orders     None         Jacalyn Lefevre, MD 11/16/23 2228

## 2023-11-16 NOTE — Assessment & Plan Note (Signed)
 -  We will continue Flomax

## 2023-11-16 NOTE — ED Triage Notes (Signed)
Per facility pt fell multiple times Sunday. Pt has been more altered and had an episode of unresponsiveness lasting about 5 minutes.

## 2023-11-16 NOTE — Assessment & Plan Note (Signed)
Will continue antihypertensive therapy.

## 2023-11-16 NOTE — Assessment & Plan Note (Signed)
-   It is currently compensated.

## 2023-11-16 NOTE — Assessment & Plan Note (Addendum)
-   We will continue Ativan and Wellbutrin XR as well as Seroquel.

## 2023-11-16 NOTE — ED Notes (Signed)
 Patient transported to CT

## 2023-11-16 NOTE — Assessment & Plan Note (Signed)
-   We will continue the patient on IV Rocephin and follow serial culture and sensitivity.

## 2023-11-17 ENCOUNTER — Ambulatory Visit (HOSPITAL_COMMUNITY): Payer: Medicare HMO

## 2023-11-17 ENCOUNTER — Observation Stay (HOSPITAL_COMMUNITY): Payer: Medicare HMO

## 2023-11-17 DIAGNOSIS — Z79890 Hormone replacement therapy (postmenopausal): Secondary | ICD-10-CM | POA: Diagnosis not present

## 2023-11-17 DIAGNOSIS — Z886 Allergy status to analgesic agent status: Secondary | ICD-10-CM | POA: Diagnosis not present

## 2023-11-17 DIAGNOSIS — G9341 Metabolic encephalopathy: Secondary | ICD-10-CM | POA: Diagnosis not present

## 2023-11-17 DIAGNOSIS — Y846 Urinary catheterization as the cause of abnormal reaction of the patient, or of later complication, without mention of misadventure at the time of the procedure: Secondary | ICD-10-CM | POA: Diagnosis not present

## 2023-11-17 DIAGNOSIS — N3001 Acute cystitis with hematuria: Secondary | ICD-10-CM | POA: Diagnosis not present

## 2023-11-17 DIAGNOSIS — R627 Adult failure to thrive: Secondary | ICD-10-CM | POA: Diagnosis not present

## 2023-11-17 DIAGNOSIS — E43 Unspecified severe protein-calorie malnutrition: Secondary | ICD-10-CM | POA: Diagnosis not present

## 2023-11-17 DIAGNOSIS — Z7951 Long term (current) use of inhaled steroids: Secondary | ICD-10-CM | POA: Diagnosis not present

## 2023-11-17 DIAGNOSIS — Z8719 Personal history of other diseases of the digestive system: Secondary | ICD-10-CM | POA: Diagnosis not present

## 2023-11-17 DIAGNOSIS — R1312 Dysphagia, oropharyngeal phase: Secondary | ICD-10-CM | POA: Diagnosis not present

## 2023-11-17 DIAGNOSIS — Z66 Do not resuscitate: Secondary | ICD-10-CM | POA: Diagnosis not present

## 2023-11-17 DIAGNOSIS — D6489 Other specified anemias: Secondary | ICD-10-CM | POA: Diagnosis not present

## 2023-11-17 DIAGNOSIS — I5032 Chronic diastolic (congestive) heart failure: Secondary | ICD-10-CM | POA: Diagnosis not present

## 2023-11-17 DIAGNOSIS — F32A Depression, unspecified: Secondary | ICD-10-CM | POA: Diagnosis not present

## 2023-11-17 DIAGNOSIS — D638 Anemia in other chronic diseases classified elsewhere: Secondary | ICD-10-CM | POA: Diagnosis not present

## 2023-11-17 DIAGNOSIS — R296 Repeated falls: Secondary | ICD-10-CM | POA: Diagnosis not present

## 2023-11-17 DIAGNOSIS — J9611 Chronic respiratory failure with hypoxia: Secondary | ICD-10-CM | POA: Diagnosis not present

## 2023-11-17 DIAGNOSIS — D631 Anemia in chronic kidney disease: Secondary | ICD-10-CM | POA: Diagnosis not present

## 2023-11-17 DIAGNOSIS — M6281 Muscle weakness (generalized): Secondary | ICD-10-CM | POA: Diagnosis not present

## 2023-11-17 DIAGNOSIS — J441 Chronic obstructive pulmonary disease with (acute) exacerbation: Secondary | ICD-10-CM | POA: Diagnosis not present

## 2023-11-17 DIAGNOSIS — I10 Essential (primary) hypertension: Secondary | ICD-10-CM | POA: Diagnosis not present

## 2023-11-17 DIAGNOSIS — C3412 Malignant neoplasm of upper lobe, left bronchus or lung: Secondary | ICD-10-CM | POA: Diagnosis not present

## 2023-11-17 DIAGNOSIS — T83518A Infection and inflammatory reaction due to other urinary catheter, initial encounter: Secondary | ICD-10-CM | POA: Diagnosis not present

## 2023-11-17 DIAGNOSIS — N189 Chronic kidney disease, unspecified: Secondary | ICD-10-CM | POA: Diagnosis not present

## 2023-11-17 DIAGNOSIS — I130 Hypertensive heart and chronic kidney disease with heart failure and stage 1 through stage 4 chronic kidney disease, or unspecified chronic kidney disease: Secondary | ICD-10-CM | POA: Diagnosis not present

## 2023-11-17 DIAGNOSIS — N184 Chronic kidney disease, stage 4 (severe): Secondary | ICD-10-CM | POA: Diagnosis not present

## 2023-11-17 DIAGNOSIS — F419 Anxiety disorder, unspecified: Secondary | ICD-10-CM | POA: Diagnosis not present

## 2023-11-17 DIAGNOSIS — R636 Underweight: Secondary | ICD-10-CM | POA: Diagnosis not present

## 2023-11-17 DIAGNOSIS — N3000 Acute cystitis without hematuria: Secondary | ICD-10-CM | POA: Diagnosis not present

## 2023-11-17 DIAGNOSIS — J449 Chronic obstructive pulmonary disease, unspecified: Secondary | ICD-10-CM | POA: Diagnosis not present

## 2023-11-17 DIAGNOSIS — N281 Cyst of kidney, acquired: Secondary | ICD-10-CM | POA: Diagnosis not present

## 2023-11-17 DIAGNOSIS — Z87891 Personal history of nicotine dependence: Secondary | ICD-10-CM | POA: Diagnosis not present

## 2023-11-17 DIAGNOSIS — Z79899 Other long term (current) drug therapy: Secondary | ICD-10-CM | POA: Diagnosis not present

## 2023-11-17 DIAGNOSIS — I110 Hypertensive heart disease with heart failure: Secondary | ICD-10-CM | POA: Diagnosis not present

## 2023-11-17 DIAGNOSIS — F0152 Vascular dementia, unspecified severity, with psychotic disturbance: Secondary | ICD-10-CM | POA: Diagnosis not present

## 2023-11-17 DIAGNOSIS — Z9981 Dependence on supplemental oxygen: Secondary | ICD-10-CM | POA: Diagnosis not present

## 2023-11-17 DIAGNOSIS — Z8546 Personal history of malignant neoplasm of prostate: Secondary | ICD-10-CM | POA: Diagnosis not present

## 2023-11-17 DIAGNOSIS — R488 Other symbolic dysfunctions: Secondary | ICD-10-CM | POA: Diagnosis not present

## 2023-11-17 DIAGNOSIS — N401 Enlarged prostate with lower urinary tract symptoms: Secondary | ICD-10-CM | POA: Diagnosis not present

## 2023-11-17 DIAGNOSIS — R498 Other voice and resonance disorders: Secondary | ICD-10-CM | POA: Diagnosis not present

## 2023-11-17 DIAGNOSIS — E039 Hypothyroidism, unspecified: Secondary | ICD-10-CM | POA: Diagnosis not present

## 2023-11-17 DIAGNOSIS — F4321 Adjustment disorder with depressed mood: Secondary | ICD-10-CM | POA: Diagnosis not present

## 2023-11-17 DIAGNOSIS — N35811 Other urethral stricture, male, meatal: Secondary | ICD-10-CM | POA: Diagnosis not present

## 2023-11-17 DIAGNOSIS — I4510 Unspecified right bundle-branch block: Secondary | ICD-10-CM | POA: Diagnosis not present

## 2023-11-17 DIAGNOSIS — R262 Difficulty in walking, not elsewhere classified: Secondary | ICD-10-CM | POA: Diagnosis not present

## 2023-11-17 DIAGNOSIS — G9340 Encephalopathy, unspecified: Secondary | ICD-10-CM | POA: Diagnosis not present

## 2023-11-17 LAB — CBC
HCT: 29.5 % — ABNORMAL LOW (ref 39.0–52.0)
Hemoglobin: 8.9 g/dL — ABNORMAL LOW (ref 13.0–17.0)
MCH: 29 pg (ref 26.0–34.0)
MCHC: 30.2 g/dL (ref 30.0–36.0)
MCV: 96.1 fL (ref 80.0–100.0)
Platelets: 175 10*3/uL (ref 150–400)
RBC: 3.07 MIL/uL — ABNORMAL LOW (ref 4.22–5.81)
RDW: 19.9 % — ABNORMAL HIGH (ref 11.5–15.5)
WBC: 4 10*3/uL (ref 4.0–10.5)
nRBC: 0 % (ref 0.0–0.2)

## 2023-11-17 LAB — BASIC METABOLIC PANEL
Anion gap: 9 (ref 5–15)
BUN: 76 mg/dL — ABNORMAL HIGH (ref 8–23)
CO2: 22 mmol/L (ref 22–32)
Calcium: 8.6 mg/dL — ABNORMAL LOW (ref 8.9–10.3)
Chloride: 115 mmol/L — ABNORMAL HIGH (ref 98–111)
Creatinine, Ser: 2.29 mg/dL — ABNORMAL HIGH (ref 0.61–1.24)
GFR, Estimated: 27 mL/min — ABNORMAL LOW (ref >60)
Glucose, Bld: 94 mg/dL (ref 70–99)
Potassium: 4.7 mmol/L (ref 3.5–5.1)
Sodium: 146 mmol/L — ABNORMAL HIGH (ref 135–145)

## 2023-11-17 LAB — MRSA NEXT GEN BY PCR, NASAL: MRSA by PCR Next Gen: NOT DETECTED

## 2023-11-17 MED ORDER — HYDRALAZINE HCL 20 MG/ML IJ SOLN
10.0000 mg | Freq: Four times a day (QID) | INTRAMUSCULAR | Status: DC | PRN
  Administered 2023-11-17: 10 mg via INTRAVENOUS
  Filled 2023-11-17: qty 1

## 2023-11-17 MED ORDER — POLYETHYLENE GLYCOL 3350 17 G PO PACK
17.0000 g | PACK | Freq: Two times a day (BID) | ORAL | Status: DC
  Administered 2023-11-17 – 2023-11-19 (×4): 17 g via ORAL
  Filled 2023-11-17 (×4): qty 1

## 2023-11-17 MED ORDER — HALOPERIDOL LACTATE 5 MG/ML IJ SOLN: 1.0000 mg | Freq: Four times a day (QID) | INTRAMUSCULAR | Status: DC | PRN

## 2023-11-17 MED ORDER — TAMSULOSIN HCL 0.4 MG PO CAPS
0.4000 mg | ORAL_CAPSULE | Freq: Two times a day (BID) | ORAL | Status: DC
  Administered 2023-11-17 – 2023-11-19 (×5): 0.4 mg via ORAL
  Filled 2023-11-17 (×5): qty 1

## 2023-11-17 MED ORDER — SENNOSIDES-DOCUSATE SODIUM 8.6-50 MG PO TABS
2.0000 | ORAL_TABLET | Freq: Every day | ORAL | Status: DC
  Administered 2023-11-17 – 2023-11-18 (×2): 2 via ORAL
  Filled 2023-11-17 (×2): qty 2

## 2023-11-17 MED ORDER — LABETALOL HCL 5 MG/ML IV SOLN
20.0000 mg | INTRAVENOUS | Status: DC | PRN
  Administered 2023-11-17: 20 mg via INTRAVENOUS
  Filled 2023-11-17: qty 4

## 2023-11-17 NOTE — Progress Notes (Signed)
PROGRESS NOTE   [name], is a 89 y.o. male, DOB - 03-Oct-1932, ZOX:[id number]  Admit date - 11/16/2023   Admitting Physician Hannah Beat, MD  Outpatient Primary MD for the patient is [name], MD  LOS - 0  Chief Complaint  Patient presents with   Altered Mental Status       Brief Narrative:  89 y.o. male with medical history significant for essential hypertension, hypothyroidism, lung cancer, stage IV CKD, osteoarthritis and chronic respiratory failure on home O2 at 2 LPM by [place] admitted on 11/15/2022 with acute metabolic encephalopathy with recurrent falls in the setting of presumed UTI in a patient with chronic indwelling Foley (POA)    -Assessment and Plan: 1)Acute Metabolic Encephalopathy -Suspect this is due to CAUTI--POA -Prior urine culture from 10/07/2023 with multidrug-resistant Proteus Mirabilis -- Continue IV Rocephin pending culture data  2)CAUTI--POA -Patient has chronic indwelling Foley catheter--POA -UTI and antibiotic choices as above #1 -Renal ultrasound without obstructive uropathy,, medical renal disease noted -Foley in situ noted on renal ultrasound with possible hemorrhage or blood clot in the bladder--clinically no Gross hematuria noted  3)Chronic diastolic CHF (congestive heart failure) (HCC) -Appears compensated,  -May be somewhat dehydrated actually so hold PTA Lasix  4)BPH with LUTs-- -Renal ultrasound without obstructive uropathy -Continue Flomax  5)Bilateral cysts --noted on renal ultrasound from 11/17/2023 --. This includes complex 3.7 cm left renal cyst which is not significantly changed compared to prior exam. Follow-up ultrasound as outpatient in 6 months is recommended to ensure stability.  6)HTN-- -restart amlodipine 10 mg daily, labetalol 200 mg twice daily -May use IV hydralazine as needed for BP  6)Hypothyroidism - -Continue levothyroxine  7)Chronic obstructive pulmonary disease (COPD) (HCC) -No acute exacerbation,  continue bronchodilators  8)Anxiety and depression - We will continue Ativan and Wellbutrin XR as well as Seroquel.  Disposition: The patient is from: SNF              Anticipated d/c is to: SNF              Anticipated d/c date is: 2 days              Patient currently is not medically stable to d/c. Barriers: Not Clinically Stable-   Code Status :  -  Code Status: Limited: Do not attempt resuscitation (DNR) -DNR-LIMITED -Do Not Intubate/DNI    Family Communication:   -None at bedside  DVT Prophylaxis  :   - SCDs   enoxaparin (LOVENOX) injection 30 mg Start: 11/17/23 1000   Lab Results  Component Value Date   PLT 175 11/17/2023   Inpatient Medications  Scheduled Meds:  (feeding supplement) PROSource Plus  30 mL Oral TID BM   amLODipine  10 mg Oral Daily   buPROPion  100 mg Oral BID   calcium carbonate  500 mg Oral Daily   enoxaparin (LOVENOX) injection  30 mg Subcutaneous Q24H   ferrous sulfate  325 mg Oral Once per day on Monday Wednesday Friday   gabapentin  150 mg Oral QHS   labetalol  200 mg Oral BID   levothyroxine  150 mcg Oral Q0600   melatonin  6 mg Oral QHS   mometasone-formoterol  2 puff Inhalation BID   polyethylene glycol  17 g Oral BID   QUEtiapine  12.5 mg Oral QHS   senna-docusate  2 tablet Oral QHS   tamsulosin  0.4 mg Oral BID   umeclidinium bromide  1 puff Inhalation Daily  Continuous Infusions:  sodium chloride 100 mL/hr at 11/17/23 0125   cefTRIAXone (ROCEPHIN)  IV 1 g (11/17/23 0953)   PRN Meds:.acetaminophen **OR** acetaminophen, haloperidol lactate, hydrALAZINE, labetalol, LORazepam, magnesium hydroxide, metoCLOPramide (REGLAN) injection, traZODone   Anti-infectives (From admission, onward)    Start     Dose/Rate Route Frequency Ordered Stop   11/17/23 1000  cefTRIAXone (ROCEPHIN) 1 g in sodium chloride 0.9 % 100 mL IVPB        1 g 200 mL/hr over 30 Minutes Intravenous Every 24 hours 11/16/23 2254     11/16/23 2215  cefTRIAXone  (ROCEPHIN) 1 g in sodium chloride 0.9 % 100 mL IVPB        1 g 200 mL/hr over 30 Minutes Intravenous  Once 11/16/23 2203 11/16/23 2252       Subjective: [name] today has no fevers, no emesis,  No chest pain,   - Cooperative -Some cognitive and memory deficits noted -Foley in situ but no gross hematuria noted   Objective: Vitals:   11/17/23 0824 11/17/23 0900 11/17/23 0930 11/17/23 0955  BP:  (!) 151/76 120/71   Pulse:  69 67   Resp:  18 14   Temp:    (!) 97.5 F (36.4 C)  TempSrc:    Axillary  SpO2: 96% 95% 93%   Weight:      Height:        Intake/Output Summary (Last 24 hours) at 11/17/2023 1049 Last data filed at 11/17/2023 1610 Gross per 24 hour  Intake --  Output 1100 ml  Net -1100 ml   Filed Weights   11/16/23 1838  Weight: 59 kg    Physical Exam  Gen:- Awake Alert,  In no apparent distress  HEENT:- [place].AT, No sclera icterus Neck-Supple Neck,No JVD,.  Lungs-   fair symmetrical air movement, no significant wheezing CV- S1, S2 normal, regular  Abd-  +ve B.Sounds, Abd Soft, No tenderness, no CVA area tenderness Extremity/Skin:- No  edema, pedal pulses present , Vitiligo Psych-affect is appropriate, oriented x2, underlying memory and cognitive deficits Neuro-generalized weakness, no new focal deficits, no tremors GU--Foley in situ with clear urine (POA-Chronic foley)  Data Reviewed: I have personally reviewed following labs and imaging studies  CBC: Recent Labs  Lab 11/15/23 1257 11/16/23 1922 11/17/23 0243  WBC 3.9* 4.7 4.0  NEUTROABS 2.9 3.2  --   HGB 9.2* 9.4* 8.9*  HCT 29.8* 30.3* 29.5*  MCV 96.4 95.6 96.1  PLT 215 203 175   Basic Metabolic Panel: Recent Labs  Lab 11/15/23 1257 11/16/23 1922 11/17/23 0243  NA 139 138 146*  K 5.1 5.0 4.7  CL 111 111 115*  CO2 20* 18* 22  GLUCOSE 108* 101* 94  BUN 87* 84* 76*  CREATININE 2.48* 2.44* 2.29*  CALCIUM 8.7* 8.5* 8.6*   GFR: Estimated Creatinine Clearance: 18.6 mL/min (A) (by C-G  formula based on SCr of 2.29 mg/dL (H)). Liver Function Tests: Recent Labs  Lab 11/16/23 1922  AST 24  ALT 32  ALKPHOS 75  BILITOT 0.8  PROT 7.4  ALBUMIN 3.2*   US Renal Result Date: 11/17/2023 CLINICAL DATA:  Chronic kidney disease. EXAM: RENAL / URINARY TRACT ULTRASOUND COMPLETE COMPARISON:  July 08, 2023. FINDINGS: Right Kidney: Renal measurements: 8.8 x 4.4 x 3.5 cm = volume: 72 mL. Increased echogenicity of renal parenchyma is noted consistent with medical renal disease. 2 simple cysts are noted, the largest measuring 2.1 cm. No mass or hydronephrosis visualized. Left Kidney: Renal measurements: 11.2 x  5.7 x 6.1 cm = volume: 204 mL. Increased echogenicity of renal parenchyma is noted suggesting medical renal disease. 3.7 cm complex cyst is noted in midpole which is not significantly changed compared to prior exam. No mass or hydronephrosis visualized. Bladder: Foley catheter is noted. Complex echogenic material is noted within urinary bladder concerning for possible hemorrhage or blood clot. Other: None. IMPRESSION: Increased echogenicity of renal parenchyma is noted bilaterally consistent with medical renal disease. No hydronephrosis or renal obstruction is noted. Foley catheter is noted in urinary bladder. Complex echogenic material is noted within urinary bladder concerning for possible hemorrhage or blood clot. Bilateral cysts are noted. This includes complex 3.7 cm left renal cyst which is not significantly changed compared to prior exam. Follow-up ultrasound in 6 months is recommended to ensure stability. Electronically Signed   By: Lupita Raider M.D.   On: 11/17/2023 10:00   DG Chest Portable 1 View Result Date: 11/16/2023 CLINICAL DATA:  Altered mental status EXAM: PORTABLE CHEST 1 VIEW COMPARISON:  10/25/2023 FINDINGS: Heart size remains upper limits of normal. 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 No sign of acute  infiltrate, edema, collapse or effusion. No acute bone finding. IMPRESSION: No active disease. 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 Electronically Signed   By: Paulina Fusi M.D.   On: 11/16/2023 19:38   CT Head Wo Contrast Result Date: 11/16/2023 CLINICAL DATA:  Mental status change of unknown cause. Multiple recent falls. EXAM: CT HEAD WITHOUT CONTRAST TECHNIQUE: Contiguous axial images were obtained from the base of the skull through the vertex without intravenous contrast. RADIATION DOSE REDUCTION: This exam was performed according to the departmental dose-optimization program which includes automated exposure control, adjustment of the mA and/or kV according to patient size and/or use of iterative reconstruction technique. COMPARISON:  10/25/2023 FINDINGS: Brain: No acute finding. Age related atrophy. Chronic small-vessel ischemic changes of the hemispheric white matter. No acute stroke, mass, hemorrhage, hydrocephalus or extra-axial collection. Vascular: There is atherosclerotic calcification of the major vessels at the base of the brain. Skull: Negative Sinuses/Orbits: Clear/normal Other: None IMPRESSION: No acute CT finding. Age related atrophy. Chronic small-vessel ischemic changes of the hemispheric white matter. Electronically Signed   By: Paulina Fusi M.D.   On: 11/16/2023 19:37   Scheduled Meds:  (feeding supplement) PROSource Plus  30 mL Oral TID BM   amLODipine  10 mg Oral Daily   buPROPion  100 mg Oral BID   calcium carbonate  500 mg Oral Daily   enoxaparin (LOVENOX) injection  30 mg Subcutaneous Q24H   ferrous sulfate  325 mg Oral Once per day on Monday Wednesday Friday   gabapentin  150 mg Oral QHS   labetalol  200 mg Oral BID   levothyroxine  150 mcg Oral Q0600   melatonin  6 mg Oral QHS   mometasone-formoterol  2 puff Inhalation BID   polyethylene glycol  17 g Oral BID   QUEtiapine  12.5 mg Oral QHS    senna-docusate  2 tablet Oral QHS   tamsulosin  0.4 mg Oral BID   umeclidinium bromide  1 puff Inhalation Daily   Continuous Infusions:  sodium chloride 100 mL/hr at 11/17/23 0125   cefTRIAXone (ROCEPHIN)  IV 1 g (11/17/23 0953)    LOS: 0 days   Shon Hale M.D on 11/17/2023 at 10:49 AM  Go to [email or web address] - for contact  info  [facility] - Office  [phone]  If 7PM-7AM, please contact night-coverage [email or web address] 11/17/2023, 10:49 AM

## 2023-11-17 NOTE — Plan of Care (Signed)
  Problem: Acute Rehab PT Goals(only PT should resolve) Goal: Pt Will Go Supine/Side To Sit Outcome: Progressing Flowsheets (Taken 11/17/2023 1215) Pt will go Supine/Side to Sit: with moderate assist Goal: Patient Will Transfer Sit To/From Stand Outcome: Progressing Flowsheets (Taken 11/17/2023 1215) Patient will transfer sit to/from stand: with moderate assist Goal: Pt Will Transfer Bed To Chair/Chair To Bed Outcome: Progressing Flowsheets (Taken 11/17/2023 1215) Pt will Transfer Bed to Chair/Chair to Bed: with mod assist Goal: Pt Will Ambulate Outcome: Progressing Flowsheets (Taken 11/17/2023 1215) Pt will Ambulate:  10 feet  with moderate assist  with rolling walker    Denna Fryberger SPT

## 2023-11-17 NOTE — Evaluation (Signed)
Physical Therapy Evaluation Patient Details Name: [name] MRN: [id number] DOB: 1933-05-25 Today's Date: 11/17/2023  History of Present Illness  [name] is a 89 y.o. male with medical history significant for essential hypertension, hypothyroidism, lung cancer, stage IV CKD, osteoarthritis and chronic respiratory failure on home O2 at 2 LPM by [place], who presented to the emergency room with acute onset of altered mental status with confusion as well as generalized weakness at his SNF.  He has had several falls since Sunday, 11/14/2023.  Had an episode of unresponsiveness today.  No reported fever or chills.  No reported nausea or vomiting or abdominal pain.  He has a chronic indwelling Foley catheter due to chronic retention. No chest pain or palpitations.  No cough or wheezing or dyspnea.  No bleeding diathesis.   Clinical Impression  Patient was agreeable to therapy. Patient was max assist for all tasks assessed and has a posterior lean when standing and ambulating. RW was used for transfers and ambulation. Cueing was required to keep walker close and turn it while ambulating.Patient was limited to a few side steps during transfers as patient had posterior lean and was unsteady during ambulation with heavy reliance on RW for balance. Patient was left in chair at conclusion of session. Patient will benefit from continued skilled physical therapy in hospital and recommended venue below to increase strength, balance, endurance for safe ADLs and gait.        If plan is discharge home, recommend the following: A lot of help with walking and/or transfers;A lot of help with bathing/dressing/bathroom;Assistance with cooking/housework;Help with stairs or ramp for entrance   Can travel by private vehicle   No    Equipment Recommendations None recommended by PT  Recommendations for Other Services       Functional Status Assessment Patient has had a recent decline in their functional  status and demonstrates the ability to make significant improvements in function in a reasonable and predictable amount of time.     Precautions / Restrictions Precautions Precautions: Fall Restrictions Weight Bearing Restrictions Per Provider Order: No      Mobility  Bed Mobility Overal bed mobility: Needs Assistance Bed Mobility: Supine to Sit     Supine to sit: Supervision, Contact guard          Transfers Overall transfer level: Needs assistance Equipment used: Rolling walker (2 wheels) Transfers: Sit to/from Stand, Bed to chair/wheelchair/BSC Sit to Stand: Max assist   Step pivot transfers: Max assist            Ambulation/Gait Ambulation/Gait assistance: Max assist Gait Distance (Feet): 5 Feet (side steps) Assistive device: Rolling walker (2 wheels) Gait Pattern/deviations: Step-to pattern, Decreased step length - right, Decreased step length - left, Decreased stride length Gait velocity: slow     General Gait Details: limited to a few slow labored unsteady side steps before having to sit due to weakness  Stairs            Wheelchair Mobility     Tilt Bed    Modified Rankin (Stroke Patients Only)       Balance Overall balance assessment: Needs assistance Sitting-balance support: Feet supported Sitting balance-Leahy Scale: Poor Sitting balance - Comments: seated at EOB Postural control: Posterior lean Standing balance support: Bilateral upper extremity supported, During functional activity, Reliant on assistive device for balance Standing balance-Leahy Scale: Poor Standing balance comment: using RW  Pertinent Vitals/Pain Pain Assessment Pain Assessment: No/denies pain    Home Living Family/patient expects to be discharged to:: Private residence Living Arrangements: Alone;Other (Comment) Available Help at Discharge: Family;Available PRN/intermittently Type of Home: House Home Access: Level  entry       Home Layout: One level Home Equipment: Agricultural consultant (2 wheels);Cane - quad;Grab bars - tub/shower;BSC/3in1      Prior Function Prior Level of Function : Needs assist       Physical Assist : Mobility (physical);ADLs (physical) Mobility (physical): Transfers ADLs (physical): Bathing;Dressing;Toileting Mobility Comments: household and short distanced community ambulator using quad-cane, does not drive prior to last admission ADLs Comments: Independent with household ADLs, assisted by family for community prior to last admission     Extremity/Trunk Assessment   Upper Extremity Assessment Upper Extremity Assessment: Defer to OT evaluation    Lower Extremity Assessment Lower Extremity Assessment: Generalized weakness    Cervical / Trunk Assessment Cervical / Trunk Assessment: Normal  Communication   Communication Communication: No apparent difficulties    Cognition Arousal: Alert Behavior During Therapy: WFL for tasks assessed/performed                             Following commands: Intact       Cueing Cueing Techniques: Verbal cues, Tactile cues     General Comments      Exercises     Assessment/Plan    PT Assessment Patient needs continued PT services  PT Problem List Decreased strength;Decreased range of motion;Decreased activity tolerance;Decreased balance;Decreased mobility       PT Treatment Interventions DME instruction;Gait training;Stair training;Functional mobility training;Therapeutic activities;Therapeutic exercise;Balance training;Patient/family education    PT Goals (Current goals can be found in the Care Plan section)  Acute Rehab PT Goals Patient Stated Goal: return home PT Goal Formulation: With patient Time For Goal Achievement: 12/01/23 Potential to Achieve Goals: Good    Frequency Min 3X/week     Co-evaluation               AM-PAC PT "6 Clicks" Mobility  Outcome Measure Help needed turning from  your back to your side while in a flat bed without using bedrails?: A Lot Help needed moving from lying on your back to sitting on the side of a flat bed without using bedrails?: A Lot Help needed moving to and from a bed to a chair (including a wheelchair)?: A Lot Help needed standing up from a chair using your arms (e.g., wheelchair or bedside chair)?: A Lot Help needed to walk in hospital room?: A Lot Help needed climbing 3-5 steps with a railing? : Total 6 Click Score: 11    End of Session   Activity Tolerance: Patient tolerated treatment well;Patient limited by fatigue Patient left: in chair;with call bell/phone within reach;with family/visitor present Nurse Communication: Mobility status PT Visit Diagnosis: Unsteadiness on feet (R26.81);Other abnormalities of gait and mobility (R26.89);Repeated falls (R29.6);Muscle weakness (generalized) (M62.81)    Time: 1127-1200 PT Time Calculation (min) (ACUTE ONLY): 33 min   Charges:   PT Evaluation $PT Eval Moderate Complexity: 1 Mod PT Treatments $Therapeutic Activity: 23-37 mins PT General Charges $$ ACUTE PT VISIT: 1 Visit         Jordell Outten SPT

## 2023-11-17 NOTE — TOC Initial Note (Signed)
Transition of Care [facility]) - Initial/Assessment Note   Patient Details  Name: [name] MRN: [id number] Date of Birth: 24-Jun-1933  Transition of Care [facility]) CM/SW Contact:    Villa Herb, LCSWA Phone Number: 11/17/2023, 8:57 AM  Clinical Narrative:                 CSW notes per chart review that pt arrived from [facility]. CSW spoke with Lynnea Ferrier in admissions at [facility] who states pt is there for SNF at this time. Lynnea Ferrier states pt will need to be seen by PT and have auth started prior to return but pt can return for SNF. CSW to speak with pts family as well for update on plans. TOC to follow.   Expected Discharge Plan: Skilled Nursing Facility Barriers to Discharge: Continued Medical Work up   Patient Goals and CMS Choice Patient states their goals for this hospitalization and ongoing recovery are:: SNF CMS Medicare.gov Compare Post Acute Care list provided to:: Patient Represenative (must comment) Choice offered to / list presented to : Adult Children Green ownership interest in [facility].provided to:: Adult Children    Expected Discharge Plan and Services In-house Referral: Clinical Social Work Discharge Planning Services: CM Consult Post Acute Care Choice: Skilled Nursing Facility Living arrangements for the past 2 months: Single Family Home                                      Prior Living Arrangements/Services Living arrangements for the past 2 months: Single Family Home Lives with:: Relatives Patient language and need for interpreter reviewed:: Yes Do you feel safe going back to the place where you live?: Yes      Need for Family Participation in Patient Care: Yes (Comment) Care giver support system in place?: Yes (comment)   Criminal Activity/Legal Involvement Pertinent to Current Situation/Hospitalization: No - Comment as needed  Activities of Daily Living      Permission Sought/Granted                  Emotional  Assessment Appearance:: Appears stated age       Alcohol / Substance Use: Not Applicable Psych Involvement: No (comment)  Admission diagnosis:  Acute encephalopathy [G93.40] Patient Active Problem List   Diagnosis Date Noted   Acute encephalopathy 11/16/2023   Acute lower UTI 11/16/2023   Anxiety and depression 11/16/2023   BPH (benign prostatic hyperplasia) 11/16/2023   Hypothyroidism 11/16/2023   Chronic obstructive pulmonary disease (COPD) (HCC) 11/16/2023   Hyperkalemia 10/26/2023   Hypermagnesemia 10/26/2023   CAP (community acquired pneumonia) 10/25/2023   Acute metabolic encephalopathy 10/07/2023   Iron deficiency anemia 10/07/2023   Acute kidney injury superimposed on stage 4 chronic kidney disease (HCC) 10/07/2023   Dehydration 10/07/2023   Prolonged QT interval 10/07/2023   Generalized weakness 10/07/2023   Failure to thrive in adult 10/07/2023   Hypoalbuminemia due to protein-calorie malnutrition (HCC) 10/07/2023   Chronic respiratory failure with hypoxia (HCC) 10/07/2023   Anxiety 09/27/2023   Mixed anxiety and depressive disorder 09/27/2023   Physical deconditioning 09/13/2023   Non-small cell lung cancer (HCC) 09/13/2023   HCAP (healthcare-associated pneumonia) 08/24/2023   Fecal occult blood test positive 08/20/2023   Normocytic anemia 08/20/2023   Hypotension 08/20/2023   Weight loss 08/09/2023   Situational depression 08/09/2023   Aortic atherosclerosis (HCC) 07/30/2023   Hypocalcemia 07/26/2023   Benign hypertension with coincident  congestive heart failure (HCC) 07/22/2023   Benign hypertension with chronic kidney disease, stage IV (HCC) 07/22/2023   Severe protein-calorie malnutrition (HCC) 07/22/2023   Chronic diastolic CHF (congestive heart failure) (HCC) 07/10/2023   Thyromegaly 07/08/2023   Acute respiratory failure with hypoxia (HCC) 07/08/2023   Peripheral edema 07/08/2023   Hypertensive crisis 07/08/2023   COPD (chronic obstructive pulmonary  disease) (HCC) 07/07/2023   Penile discharge 05/06/2023   Vitiligo 12/17/2021   Chronic kidney disease, stage 4 (severe) (HCC) 07/18/2021   Essential hypertension 04/28/2021   Acquired hypothyroidism 04/28/2021   Underweight 04/28/2021   Chronic pain syndrome 04/28/2021   Carcinoma of prostate (HCC) 04/28/2021   Benign prostatic hyperplasia 04/28/2021   Osteoarthritis of knees, bilateral 04/28/2021   Dysphagia 04/10/2021   Anemia of chronic disease 02/10/2021   Cancer associated pain 12/23/2020   Primary cancer of right upper lobe of lung (HCC) 11/07/2020   PCP:  [name], MD Pharmacy:   [facility] - [place], [place] - [address] [place] [address] Phone: [phone] Fax: [phone]  [facility] - [place], [place] - [address] [place] [address] Phone: [phone] Fax: [phone]     Social Drivers of Health (SDOH) Social History: SDOH Screenings   Food Insecurity: No Food Insecurity (10/07/2023)  Housing: Low Risk  (10/07/2023)  Transportation Needs: No Transportation Needs (10/07/2023)  Utilities: Not At Risk (10/07/2023)  Depression (PHQ2-9): Low Risk  (02/23/2023)  Social Connections: Unknown (10/08/2023)  Tobacco Use: Medium Risk (11/16/2023)   SDOH Interventions:     Readmission Risk Interventions    10/26/2023   11:45 AM 10/08/2023   12:45 PM  Readmission Risk Prevention Plan  Transportation Screening Complete Complete  Medication Review (RN Care Manager)  Complete  PCP or Specialist appointment within 3-5 days of discharge Complete   HRI or Home Care Consult Complete Complete  SW Recovery Care/Counseling Consult Complete Complete  Palliative Care Screening Not Applicable Not Applicable  Skilled Nursing Facility Complete Complete

## 2023-11-18 DIAGNOSIS — G9340 Encephalopathy, unspecified: Secondary | ICD-10-CM | POA: Diagnosis not present

## 2023-11-18 LAB — RENAL FUNCTION PANEL
Albumin: 2.5 g/dL — ABNORMAL LOW (ref 3.5–5.0)
Anion gap: 8 (ref 5–15)
BUN: 70 mg/dL — ABNORMAL HIGH (ref 8–23)
CO2: 19 mmol/L — ABNORMAL LOW (ref 22–32)
Calcium: 7.9 mg/dL — ABNORMAL LOW (ref 8.9–10.3)
Chloride: 113 mmol/L — ABNORMAL HIGH (ref 98–111)
Creatinine, Ser: 2.19 mg/dL — ABNORMAL HIGH (ref 0.61–1.24)
GFR, Estimated: 28 mL/min — ABNORMAL LOW (ref >60)
Glucose, Bld: 109 mg/dL — ABNORMAL HIGH (ref 70–99)
Phosphorus: 3.3 mg/dL (ref 2.5–4.6)
Potassium: 4.5 mmol/L (ref 3.5–5.1)
Sodium: 140 mmol/L (ref 135–145)

## 2023-11-18 MED ORDER — CHLORHEXIDINE GLUCONATE CLOTH 2 % EX PADS
6.0000 | MEDICATED_PAD | Freq: Every day | CUTANEOUS | Status: DC
  Administered 2023-11-18 – 2023-11-19 (×2): 6 via TOPICAL

## 2023-11-18 MED ORDER — AMOXICILLIN 250 MG PO CAPS
500.0000 mg | ORAL_CAPSULE | Freq: Two times a day (BID) | ORAL | Status: DC
  Administered 2023-11-18 – 2023-11-19 (×2): 500 mg via ORAL
  Filled 2023-11-18 (×2): qty 2

## 2023-11-18 NOTE — Progress Notes (Signed)
Physical Therapy Treatment Patient Details Name: [name] MRN: [id number] DOB: 01-09-1933 Today's Date: 11/18/2023   History of Present Illness [name] is a 89 y.o. male with medical history significant for essential hypertension, hypothyroidism, lung cancer, stage IV CKD, osteoarthritis and chronic respiratory failure on home O2 at 2 LPM by [place], who presented to the emergency room with acute onset of altered mental status with confusion as well as generalized weakness at his SNF.  He has had several falls since Sunday, 11/14/2023.  Had an episode of unresponsiveness today.  No reported fever or chills.  No reported nausea or vomiting or abdominal pain.  He has a chronic indwelling Foley catheter due to chronic retention. No chest pain or palpitations.  No cough or wheezing or dyspnea.  No bleeding diathesis.    PT Comments  Patient was agreeable to therapy. Required supervision/ctg for bed mobility and max assist for transfers and ambulation. RW was used for transfers and ambulation. Patient required min cueing for moving RW with them when ambulating backwards. Was able to maintain balance and perform exercises AROM while seated in the chair. Patient was left in chair at conclusion of session. Patient will benefit from continued skilled physical therapy in hospital and recommended venue below to increase strength, balance, endurance for safe ADLs and gait.     If plan is discharge home, recommend the following: A lot of help with walking and/or transfers;A lot of help with bathing/dressing/bathroom;Assistance with cooking/housework;Help with stairs or ramp for entrance   Can travel by private vehicle     No  Equipment Recommendations  None recommended by PT    Recommendations for Other Services       Precautions / Restrictions Precautions Precautions: Fall Restrictions Weight Bearing Restrictions Per Provider Order: No     Mobility  Bed Mobility Overal bed mobility:  Needs Assistance Bed Mobility: Supine to Sit     Supine to sit: Supervision, Contact guard     General bed mobility comments: increased time, labored movement Patient Response: Cooperative  Transfers Overall transfer level: Needs assistance Equipment used: Rolling walker (2 wheels) Transfers: Sit to/from Stand, Bed to chair/wheelchair/BSC Sit to Stand: Max assist   Step pivot transfers: Max assist       General transfer comment: unsteady labored movement    Ambulation/Gait Ambulation/Gait assistance: Max Chemical engineer (Feet): 8 Feet Assistive device: Rolling walker (2 wheels) Gait Pattern/deviations: Step-to pattern, Decreased step length - right, Decreased step length - left, Decreased stride length Gait velocity: slow     General Gait Details: slow labored unsteady steps, frequent breaks due to fatigue   Stairs             Wheelchair Mobility     Tilt Bed Tilt Bed Patient Response: Cooperative  Modified Rankin (Stroke Patients Only)       Balance Overall balance assessment: Needs assistance Sitting-balance support: Feet supported Sitting balance-Leahy Scale: Poor Sitting balance - Comments: seated at EOB Postural control: Posterior lean Standing balance support: Bilateral upper extremity supported, During functional activity, Reliant on assistive device for balance Standing balance-Leahy Scale: Poor Standing balance comment: using RW                            Communication Communication Communication: No apparent difficulties  Cognition Arousal: Alert Behavior During Therapy: [facility] for tasks assessed/performed  Following commands: Intact      Cueing Cueing Techniques: Verbal cues, Tactile cues  Exercises General Exercises - Lower Extremity Long Arc Quad: Seated, AROM, Both, 10 reps Hip Flexion/Marching: Seated, AROM, Both, 10 reps Toe Raises: Seated, AROM, Both, 10 reps Heel  Raises: Seated, AROM, Both, 10 reps    General Comments        Pertinent Vitals/Pain Pain Assessment Pain Assessment: No/denies pain    Home Living                          Prior Function            PT Goals (current goals can now be found in the care plan section) Acute Rehab PT Goals Patient Stated Goal: return home PT Goal Formulation: With patient Time For Goal Achievement: 12/01/23 Potential to Achieve Goals: Good Progress towards PT goals: Progressing toward goals    Frequency    Min 3X/week      PT Plan      Co-evaluation              AM-PAC PT "6 Clicks" Mobility   Outcome Measure  Help needed turning from your back to your side while in a flat bed without using bedrails?: A Little Help needed moving from lying on your back to sitting on the side of a flat bed without using bedrails?: A Little Help needed moving to and from a bed to a chair (including a wheelchair)?: A Lot Help needed standing up from a chair using your arms (e.g., wheelchair or bedside chair)?: A Lot Help needed to walk in hospital room?: A Lot Help needed climbing 3-5 steps with a railing? : Total 6 Click Score: 13    End of Session   Activity Tolerance: Patient tolerated treatment well;Patient limited by fatigue Patient left: in chair;with call bell/phone within reach Nurse Communication: Mobility status PT Visit Diagnosis: Unsteadiness on feet (R26.81);Other abnormalities of gait and mobility (R26.89);Repeated falls (R29.6);Muscle weakness (generalized) (M62.81)     Time: 8469-6295 PT Time Calculation (min) (ACUTE ONLY): 25 min  Charges:    $Therapeutic Exercise: 8-22 mins $Therapeutic Activity: 8-22 mins PT General Charges $$ ACUTE PT VISIT: 1 Visit                     Madhavi Hamblen SPT

## 2023-11-18 NOTE — Plan of Care (Signed)
   Problem: Education: Goal: Knowledge of General Education information will improve Description Including pain rating scale, medication(s)/side effects and non-pharmacologic comfort measures Outcome: Progressing   Problem: Health Behavior/Discharge Planning: Goal: Ability to manage health-related needs will improve Outcome: Progressing

## 2023-11-18 NOTE — Progress Notes (Signed)
PROGRESS NOTE  [name], is a 89 y.o. male, DOB - 1933/06/27, ZOX:[id number]  Admit date - 11/16/2023   Admitting Physician [name] Brian Clonts, MD  Outpatient Primary MD for the patient is Brian Stabile, MD  LOS - 1  Chief Complaint  Patient presents with   Altered Mental Status       Brief Narrative:  89 y.o. male with medical history significant for essential hypertension, hypothyroidism, lung cancer, stage IV CKD, osteoarthritis and chronic respiratory failure on home O2 at 2 LPM by [place] admitted on 11/15/2022 with acute metabolic encephalopathy with recurrent falls in the setting of presumed UTI in a patient with chronic indwelling Foley (POA)    -Assessment and Plan: 1)Acute Metabolic Encephalopathy - due to CAUTI--POA -Prior urine culture from 10/07/2023 with multidrug-resistant Proteus Mirabilis Urine Cx from 11/16/23 with Enterococcus faecalis -Stop IV Rocephin -Start amoxicillin on 11/18/23  2) Enterococcus faecalis CAUTI--POA -Patient has chronic indwelling Foley catheter--POA -UTI and antibiotic choices as above #1 -Renal ultrasound without obstructive uropathy,, medical renal disease noted -Foley in situ noted on renal ultrasound with possible hemorrhage or blood clot in the bladder--clinically no Gross hematuria noted  3)Chronic diastolic CHF (congestive heart failure) (HCC) -Appears compensated,  --May restart PTA Lasix at discharge  4)BPH with LUTs-- -Renal ultrasound without obstructive uropathy -Continue Flomax  5)Bilateral cysts --noted on renal ultrasound from 11/17/2023 --. This includes complex 3.7 cm left renal cyst which is not significantly changed compared to prior exam. Follow-up ultrasound as outpatient in 6 months is recommended to ensure stability.  6)HTN-- -c/n  amlodipine 10 mg daily, labetalol 200 mg twice daily -May use IV hydralazine as needed for BP  6)Hypothyroidism - -Continue levothyroxine  7)Chronic obstructive pulmonary disease  (COPD) (HCC) -No acute exacerbation, continue bronchodilators  8)Anxiety and depression - We will continue Ativan and Wellbutrin XR as well as Seroquel.  9) generalized weakness and deconditioning --PT eval appreciated recommends SNF rehab   Disposition--- awaiting insurance authorization to return to SNF rehab  Disposition: The patient is from: SNF              Anticipated d/c is to: SNF              Anticipated d/c date is: 1 day              Patient currently is medically stable to d/c. Barriers: Disposition--- awaiting insurance authorization to return to SNF rehab  Code Status :  -  Code Status: Limited: Do not attempt resuscitation (DNR) -DNR-LIMITED -Do Not Intubate/DNI    Family Communication:   -None at bedside  DVT Prophylaxis  :   - SCDs   enoxaparin (LOVENOX) injection 30 mg Start: 11/17/23 1000   Lab Results  Component Value Date   PLT 175 11/17/2023   Inpatient Medications  Scheduled Meds:  (feeding supplement) PROSource Plus  30 mL Oral TID BM   amLODipine  10 mg Oral Daily   amoxicillin  500 mg Oral Q12H   buPROPion  100 mg Oral BID   calcium carbonate  500 mg Oral Daily   Chlorhexidine Gluconate Cloth  6 each Topical Q0600   enoxaparin (LOVENOX) injection  30 mg Subcutaneous Q24H   ferrous sulfate  325 mg Oral Once per day on Monday Wednesday Friday   gabapentin  150 mg Oral QHS   labetalol  200 mg Oral BID   levothyroxine  150 mcg Oral Q0600   melatonin  6 mg Oral QHS   mometasone-formoterol  2 puff Inhalation BID   polyethylene glycol  17 g Oral BID   QUEtiapine  12.5 mg Oral QHS   senna-docusate  2 tablet Oral QHS   tamsulosin  0.4 mg Oral BID   umeclidinium bromide  1 puff Inhalation Daily   Continuous Infusions:  PRN Meds:.acetaminophen **OR** acetaminophen, haloperidol lactate, hydrALAZINE, labetalol, LORazepam, magnesium hydroxide, metoCLOPramide (REGLAN) injection, traZODone   Anti-infectives (From admission, onward)    Start      Dose/Rate Route Frequency Ordered Stop   11/18/23 1600  amoxicillin (AMOXIL) capsule 500 mg        500 mg Oral Every 12 hours 11/18/23 1449     11/17/23 1000  cefTRIAXone (ROCEPHIN) 1 g in sodium chloride 0.9 % 100 mL IVPB  Status:  Discontinued        1 g 200 mL/hr over 30 Minutes Intravenous Every 24 hours 11/16/23 2254 11/18/23 1449   11/16/23 2215  cefTRIAXone (ROCEPHIN) 1 g in sodium chloride 0.9 % 100 mL IVPB        1 g 200 mL/hr over 30 Minutes Intravenous  Once 11/16/23 2203 11/16/23 2252      Subjective: [name] today has no fevers, no emesis,  No chest pain,   - Cooperative -Oral intake is fair, not great LPN Lauren at bedside   Disposition--- awaiting insurance authorization to return to SNF rehab  Objective: Vitals:   11/17/23 1942 11/17/23 2208 11/18/23 0454 11/18/23 1225  BP: (!) 154/73  (!) 150/76 129/64  Pulse: 79  72   Resp: 18  16 17   Temp: 97.8 F (36.6 C)  98.3 F (36.8 C) 98.2 F (36.8 C)  TempSrc: Oral  Oral   SpO2: 100% 98% 97% 97%  Weight:      Height:        Intake/Output Summary (Last 24 hours) at 11/18/2023 1742 Last data filed at 11/18/2023 1700 Gross per 24 hour  Intake 920.09 ml  Output 1500 ml  Net -579.91 ml   Filed Weights   11/16/23 1838  Weight: 59 kg    Physical Exam  Gen:- Awake Alert,  In no apparent distress  HEENT:- [place].AT, No sclera icterus Neck-Supple Neck,No JVD,.  Lungs-   fair symmetrical air movement, no significant wheezing CV- S1, S2 normal, regular  Abd-  +ve B.Sounds, Abd Soft, No tenderness, no CVA area tenderness Extremity/Skin:- No  edema, pedal pulses present , Vitiligo Psych-affect is appropriate, oriented x2, underlying memory and cognitive deficits Neuro-generalized weakness, no new focal deficits, no tremors GU--Foley in situ with clear urine (POA-Chronic foley)  Data Reviewed: I have personally reviewed following labs and imaging studies  CBC: Recent Labs  Lab 11/15/23 1257  11/16/23 1922 11/17/23 0243  WBC 3.9* 4.7 4.0  NEUTROABS 2.9 3.2  --   HGB 9.2* 9.4* 8.9*  HCT 29.8* 30.3* 29.5*  MCV 96.4 95.6 96.1  PLT 215 203 175   Basic Metabolic Panel: Recent Labs  Lab 11/15/23 1257 11/16/23 1922 11/17/23 0243 11/18/23 0305  NA 139 138 146* 140  K 5.1 5.0 4.7 4.5  CL 111 111 115* 113*  CO2 20* 18* 22 19*  GLUCOSE 108* 101* 94 109*  BUN 87* 84* 76* 70*  CREATININE 2.48* 2.44* 2.29* 2.19*  CALCIUM 8.7* 8.5* 8.6* 7.9*  PHOS  --   --   --  3.3   GFR: Estimated Creatinine Clearance: 19.5 mL/min (A) (by C-G formula based on SCr of 2.19 mg/dL (H)). Liver Function Tests: Recent Labs  Lab 11/16/23 1922 11/18/23 0305  AST 24  --   ALT 32  --   ALKPHOS 75  --   BILITOT 0.8  --   PROT 7.4  --   ALBUMIN 3.2* 2.5*   US Renal Result Date: 11/17/2023 CLINICAL DATA:  Chronic kidney disease. EXAM: RENAL / URINARY TRACT ULTRASOUND COMPLETE COMPARISON:  July 08, 2023. FINDINGS: Right Kidney: Renal measurements: 8.8 x 4.4 x 3.5 cm = volume: 72 mL. Increased echogenicity of renal parenchyma is noted consistent with medical renal disease. 2 simple cysts are noted, the largest measuring 2.1 cm. No mass or hydronephrosis visualized. Left Kidney: Renal measurements: 11.2 x 5.7 x 6.1 cm = volume: 204 mL. Increased echogenicity of renal parenchyma is noted suggesting medical renal disease. 3.7 cm complex cyst is noted in midpole which is not significantly changed compared to prior exam. No mass or hydronephrosis visualized. Bladder: Foley catheter is noted. Complex echogenic material is noted within urinary bladder concerning for possible hemorrhage or blood clot. Other: None. IMPRESSION: Increased echogenicity of renal parenchyma is noted bilaterally consistent with medical renal disease. No hydronephrosis or renal obstruction is noted. Foley catheter is noted in urinary bladder. Complex echogenic material is noted within urinary bladder concerning for possible hemorrhage or  blood clot. Bilateral cysts are noted. This includes complex 3.7 cm left renal cyst which is not significantly changed compared to prior exam. Follow-up ultrasound in 6 months is recommended to ensure stability. Electronically Signed   By: Lupita Raider M.D.   On: 11/17/2023 10:00   DG Chest Portable 1 View Result Date: 11/16/2023 CLINICAL DATA:  Altered mental status EXAM: PORTABLE CHEST 1 VIEW COMPARISON:  10/25/2023 FINDINGS: Heart size remains upper limits of normal. 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 No sign of acute infiltrate, edema, collapse or effusion. No acute bone finding. IMPRESSION: No active disease. 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 Electronically Signed   By: Paulina Fusi M.D.   On: 11/16/2023 19:38   CT Head Wo Contrast Result Date: 11/16/2023 CLINICAL DATA:  Mental status change of unknown cause. Multiple recent falls. EXAM: CT HEAD WITHOUT CONTRAST TECHNIQUE: Contiguous axial images were obtained from the base of the skull through the vertex without intravenous contrast. RADIATION DOSE REDUCTION: This exam was performed according to the departmental dose-optimization program which includes automated exposure control, adjustment of the mA and/or kV according to patient size and/or use of iterative reconstruction technique. COMPARISON:  10/25/2023 FINDINGS: Brain: No acute finding. Age related atrophy. Chronic small-vessel ischemic changes of the hemispheric white matter. No acute stroke, mass, hemorrhage, hydrocephalus or extra-axial collection. Vascular: There is atherosclerotic calcification of the major vessels at the base of the brain. Skull: Negative Sinuses/Orbits: Clear/normal Other: None IMPRESSION: No acute CT finding. Age related atrophy. Chronic small-vessel ischemic changes of the hemispheric white matter. Electronically Signed   By: Paulina Fusi M.D.   On: 11/16/2023 19:37   Scheduled Meds:  (feeding supplement) PROSource Plus  30 mL Oral TID BM   amLODipine  10 mg Oral Daily   amoxicillin  500 mg Oral Q12H   buPROPion  100 mg Oral BID   calcium carbonate  500 mg Oral Daily   Chlorhexidine Gluconate Cloth  6 each Topical Q0600   enoxaparin (LOVENOX) injection  30 mg Subcutaneous Q24H   ferrous sulfate  325 mg Oral Once per day on  Monday Wednesday Friday   gabapentin  150 mg Oral QHS   labetalol  200 mg Oral BID   levothyroxine  150 mcg Oral Q0600   melatonin  6 mg Oral QHS   mometasone-formoterol  2 puff Inhalation BID   polyethylene glycol  17 g Oral BID   QUEtiapine  12.5 mg Oral QHS   senna-docusate  2 tablet Oral QHS   tamsulosin  0.4 mg Oral BID   umeclidinium bromide  1 puff Inhalation Daily   Continuous Infusions:    LOS: 1 day   Shon Hale M.D on 11/18/2023 at 5:42 PM  Go to [email or web address] - for contact info  [facility] - Office  [phone]  If 7PM-7AM, please contact night-coverage [email or web address] 11/18/2023, 5:42 PM

## 2023-11-19 DIAGNOSIS — R627 Adult failure to thrive: Secondary | ICD-10-CM | POA: Diagnosis not present

## 2023-11-19 DIAGNOSIS — N184 Chronic kidney disease, stage 4 (severe): Secondary | ICD-10-CM | POA: Diagnosis not present

## 2023-11-19 DIAGNOSIS — N400 Enlarged prostate without lower urinary tract symptoms: Secondary | ICD-10-CM | POA: Diagnosis not present

## 2023-11-19 DIAGNOSIS — E039 Hypothyroidism, unspecified: Secondary | ICD-10-CM | POA: Diagnosis not present

## 2023-11-19 DIAGNOSIS — I129 Hypertensive chronic kidney disease with stage 1 through stage 4 chronic kidney disease, or unspecified chronic kidney disease: Secondary | ICD-10-CM | POA: Diagnosis not present

## 2023-11-19 DIAGNOSIS — R262 Difficulty in walking, not elsewhere classified: Secondary | ICD-10-CM | POA: Diagnosis not present

## 2023-11-19 DIAGNOSIS — F4321 Adjustment disorder with depressed mood: Secondary | ICD-10-CM | POA: Diagnosis not present

## 2023-11-19 DIAGNOSIS — I110 Hypertensive heart disease with heart failure: Secondary | ICD-10-CM | POA: Diagnosis not present

## 2023-11-19 DIAGNOSIS — F01B18 Vascular dementia, moderate, with other behavioral disturbance: Secondary | ICD-10-CM | POA: Diagnosis not present

## 2023-11-19 DIAGNOSIS — C3411 Malignant neoplasm of upper lobe, right bronchus or lung: Secondary | ICD-10-CM | POA: Diagnosis not present

## 2023-11-19 DIAGNOSIS — R1310 Dysphagia, unspecified: Secondary | ICD-10-CM | POA: Diagnosis not present

## 2023-11-19 DIAGNOSIS — C3412 Malignant neoplasm of upper lobe, left bronchus or lung: Secondary | ICD-10-CM | POA: Diagnosis not present

## 2023-11-19 DIAGNOSIS — J449 Chronic obstructive pulmonary disease, unspecified: Secondary | ICD-10-CM | POA: Diagnosis not present

## 2023-11-19 DIAGNOSIS — R488 Other symbolic dysfunctions: Secondary | ICD-10-CM | POA: Diagnosis not present

## 2023-11-19 DIAGNOSIS — R636 Underweight: Secondary | ICD-10-CM | POA: Diagnosis not present

## 2023-11-19 DIAGNOSIS — M6281 Muscle weakness (generalized): Secondary | ICD-10-CM | POA: Diagnosis not present

## 2023-11-19 DIAGNOSIS — D508 Other iron deficiency anemias: Secondary | ICD-10-CM | POA: Diagnosis not present

## 2023-11-19 DIAGNOSIS — Z9981 Dependence on supplemental oxygen: Secondary | ICD-10-CM | POA: Diagnosis not present

## 2023-11-19 DIAGNOSIS — D6489 Other specified anemias: Secondary | ICD-10-CM | POA: Diagnosis not present

## 2023-11-19 DIAGNOSIS — I10 Essential (primary) hypertension: Secondary | ICD-10-CM | POA: Diagnosis not present

## 2023-11-19 DIAGNOSIS — N3000 Acute cystitis without hematuria: Secondary | ICD-10-CM | POA: Diagnosis not present

## 2023-11-19 DIAGNOSIS — N390 Urinary tract infection, site not specified: Secondary | ICD-10-CM | POA: Diagnosis not present

## 2023-11-19 DIAGNOSIS — J441 Chronic obstructive pulmonary disease with (acute) exacerbation: Secondary | ICD-10-CM | POA: Diagnosis not present

## 2023-11-19 DIAGNOSIS — M7541 Impingement syndrome of right shoulder: Secondary | ICD-10-CM | POA: Diagnosis not present

## 2023-11-19 DIAGNOSIS — G9340 Encephalopathy, unspecified: Secondary | ICD-10-CM | POA: Diagnosis not present

## 2023-11-19 DIAGNOSIS — E43 Unspecified severe protein-calorie malnutrition: Secondary | ICD-10-CM | POA: Diagnosis not present

## 2023-11-19 DIAGNOSIS — D638 Anemia in other chronic diseases classified elsewhere: Secondary | ICD-10-CM | POA: Diagnosis not present

## 2023-11-19 DIAGNOSIS — N35811 Other urethral stricture, male, meatal: Secondary | ICD-10-CM | POA: Diagnosis not present

## 2023-11-19 DIAGNOSIS — J9611 Chronic respiratory failure with hypoxia: Secondary | ICD-10-CM | POA: Diagnosis not present

## 2023-11-19 DIAGNOSIS — I700 Atherosclerosis of aorta: Secondary | ICD-10-CM | POA: Diagnosis not present

## 2023-11-19 DIAGNOSIS — R1312 Dysphagia, oropharyngeal phase: Secondary | ICD-10-CM | POA: Diagnosis not present

## 2023-11-19 DIAGNOSIS — I5032 Chronic diastolic (congestive) heart failure: Secondary | ICD-10-CM | POA: Diagnosis not present

## 2023-11-19 DIAGNOSIS — G9341 Metabolic encephalopathy: Secondary | ICD-10-CM | POA: Diagnosis not present

## 2023-11-19 DIAGNOSIS — R498 Other voice and resonance disorders: Secondary | ICD-10-CM | POA: Diagnosis not present

## 2023-11-19 DIAGNOSIS — F0152 Vascular dementia, unspecified severity, with psychotic disturbance: Secondary | ICD-10-CM | POA: Diagnosis not present

## 2023-11-19 LAB — URINE CULTURE: Culture: 20000 — AB

## 2023-11-19 MED ORDER — FERROUS SULFATE 325 (65 FE) MG PO TBEC: 325.0000 mg | DELAYED_RELEASE_TABLET | ORAL | 3 refills | Status: DC

## 2023-11-19 MED ORDER — ACETAMINOPHEN 325 MG PO TABS: 650.0000 mg | ORAL_TABLET | Freq: Four times a day (QID) | ORAL | Status: AC | PRN

## 2023-11-19 MED ORDER — FLUTICASONE-SALMETEROL 250-50 MCG/ACT IN AEPB: 1.0000 | INHALATION_SPRAY | Freq: Two times a day (BID) | RESPIRATORY_TRACT | 3 refills | Status: DC

## 2023-11-19 MED ORDER — FUROSEMIDE 40 MG PO TABS: 40.0000 mg | ORAL_TABLET | ORAL | 3 refills | Status: DC

## 2023-11-19 MED ORDER — INCRUSE ELLIPTA 62.5 MCG/ACT IN AEPB: 1.0000 | INHALATION_SPRAY | Freq: Every day | RESPIRATORY_TRACT | 2 refills | Status: DC

## 2023-11-19 MED ORDER — LABETALOL HCL 200 MG PO TABS: 200.0000 mg | ORAL_TABLET | Freq: Two times a day (BID) | ORAL | 4 refills | Status: DC

## 2023-11-19 MED ORDER — SENNOSIDES-DOCUSATE SODIUM 8.6-50 MG PO TABS: 2.0000 | ORAL_TABLET | Freq: Every day | ORAL | 4 refills | Status: AC

## 2023-11-19 MED ORDER — AMOXICILLIN 500 MG PO CAPS
500.0000 mg | ORAL_CAPSULE | Freq: Two times a day (BID) | ORAL | 0 refills | Status: AC
Start: 2023-11-19 — End: 2023-11-24

## 2023-11-19 MED ORDER — TAMSULOSIN HCL 0.4 MG PO CAPS: 0.4000 mg | ORAL_CAPSULE | Freq: Every day | ORAL | 5 refills | Status: DC

## 2023-11-19 MED ORDER — AMLODIPINE BESYLATE 10 MG PO TABS: 10.0000 mg | ORAL_TABLET | Freq: Every day | ORAL | 5 refills | Status: DC

## 2023-11-19 NOTE — Plan of Care (Signed)
  Problem: Nutrition: Goal: Adequate nutrition will be maintained Outcome: Progressing   Problem: Pain Managment: Goal: General experience of comfort will improve and/or be controlled Outcome: Progressing

## 2023-11-19 NOTE — Care Management Important Message (Signed)
Important Message  Patient Details  Name: [name] MRN: [id number] Date of Birth: Dec 20, 1932   Important Message Given:  N/A - LOS <3 / Initial given by admissions     Corey Harold 11/19/2023, 1:42 PM

## 2023-11-19 NOTE — Discharge Instructions (Signed)
 1)Avoid ibuprofen/Advil/Aleve/Motrin/Goody Powders/Naproxen/BC powders/Meloxicam/Diclofenac/Indomethacin and other Nonsteroidal anti-inflammatory medications as these will make you more likely to bleed and can cause stomach ulcers, can also cause Kidney problems.   2)Repeat CBC and BMP Blood Tests in 1 week

## 2023-11-19 NOTE — TOC Transition Note (Signed)
Transition of Care [facility]) - Discharge Note   Patient Details  Name: [name] MRN: [id number] Date of Birth: 1933-01-06  [facility]) CM/SW Contact:  Karn Cassis, LCSW Phone Number: 11/19/2023, 12:40 PM   Clinical Narrative: Pt d/c today back to [facility]. Pt's daughter and facility notified. RN given number to call report. D/C summary sent to SNF. SNF auth received. No FL2 needed per Lynnea Ferrier at [facility]. Pt will transfer with staff.         Barriers to Discharge: Barriers Resolved   Patient Goals and CMS Choice Patient states their goals for this hospitalization and ongoing recovery are:: SNF CMS Medicare.gov Compare Post Acute Care list provided to:: Patient Represenative (must comment) Choice offered to / list presented to : Adult Children [place] ownership interest in [facility].provided to:: Adult Children    Discharge Placement              Patient chooses bed at: [facility] Patient to be transferred to facility by: staff Name of family member notified: daughterBarry Brunner Patient and family notified of of transfer: 11/19/23  Discharge Plan and Services Additional resources added to the After Visit Summary for   In-house Referral: Clinical Social Work Discharge Planning Services: CM Consult Post Acute Care Choice: Skilled Nursing Facility                               Social Drivers of Health (SDOH) Interventions SDOH Screenings   Food Insecurity: No Food Insecurity (11/17/2023)  Housing: Low Risk  (11/17/2023)  Transportation Needs: No Transportation Needs (11/17/2023)  Utilities: Not At Risk (11/17/2023)  Depression (PHQ2-9): Low Risk  (02/23/2023)  Social Connections: Unknown (11/17/2023)  Tobacco Use: Medium Risk (11/16/2023)     Readmission Risk Interventions    11/17/2023    1:40 PM 10/26/2023   11:45 AM 10/08/2023   12:45 PM  Readmission Risk Prevention Plan  Transportation Screening Complete Complete  Complete  Medication Review [employer]) Complete  Complete  PCP or Specialist appointment within 3-5 days of discharge  Complete   HRI or Home Care Consult Complete Complete Complete  SW Recovery Care/Counseling Consult Complete Complete Complete  Palliative Care Screening Not Applicable Not Applicable Not Applicable  Skilled Nursing Facility Complete Complete Complete

## 2023-11-19 NOTE — Progress Notes (Signed)
Report called to Vickie LPN at [facility]. Patient can be transported via wheelchair through the tunnel.

## 2023-11-19 NOTE — Discharge Summary (Signed)
[name], is a 89 y.o. male  DOB 09-25-1933  MRN [id number].  Admission date:  11/16/2023  Admitting Physician  Shon Hale, MD  Discharge Date:  11/19/2023   Primary MD  [name], MD  Recommendations for primary care physician for things to follow:  1)Avoid ibuprofen/Advil/Aleve/Motrin/Goody Powders/Naproxen/BC powders/Meloxicam/Diclofenac/Indomethacin and other Nonsteroidal anti-inflammatory medications as these will make you more likely to bleed and can cause stomach ulcers, can also cause Kidney problems.   2)Repeat CBC and BMP Blood Tests in 1 week   Admission Diagnosis  Metabolic encephalopathy [G93.41] CKD (chronic kidney disease) stage 4, GFR 15-29 ml/min (HCC) [N18.4] Acute cystitis with hematuria [N30.01] Acute encephalopathy [G93.40] Acute metabolic encephalopathy [G93.41] Anemia due to stage 4 chronic kidney disease (HCC) [N18.4, D63.1] Indwelling Foley catheter present [Z97.8]   Discharge Diagnosis  Metabolic encephalopathy [G93.41] CKD (chronic kidney disease) stage 4, GFR 15-29 ml/min (HCC) [N18.4] Acute cystitis with hematuria [N30.01] Acute encephalopathy [G93.40] Acute metabolic encephalopathy [G93.41] Anemia due to stage 4 chronic kidney disease (HCC) [N18.4, D63.1] Indwelling Foley catheter present [Z97.8]    Principal Problem:   Acute encephalopathy Active Problems:   Chronic diastolic CHF (congestive heart failure) (HCC)   Acute lower UTI   Essential hypertension   Hypothyroidism   Acute metabolic encephalopathy   Anxiety and depression   BPH (benign prostatic hyperplasia)   Chronic obstructive pulmonary disease (COPD) (HCC)      Past Medical History:  Diagnosis Date   Arthritis    Hypertension    Hyperthyroidism    lung ca 11/2020    Past Surgical History:  Procedure Laterality Date   COLONOSCOPY N/A 09/02/2016   Procedure: COLONOSCOPY;  Surgeon:  [name], MD;  Location: [facility];  Service: Endoscopy;  Laterality: N/A;  830   NO PAST SURGERIES     POLYPECTOMY  09/02/2016   Procedure: POLYPECTOMY;  Surgeon: [name], MD;  Location: [facility];  Service: Endoscopy;;     HPI  from the history and physical done on the day of admission:     [name] is a 89 y.o. male with medical history significant for essential hypertension, hypothyroidism, lung cancer, stage IV CKD, osteoarthritis and chronic respiratory failure on home O2 at 2 LPM by [place], who presented to the emergency room with acute onset of altered mental status with confusion as well as generalized weakness at his SNF.  He has had several falls since Sunday, 11/14/2023.  Had an episode of unresponsiveness today.  No reported fever or chills.  No reported nausea or vomiting or abdominal pain.  He has a chronic indwelling Foley catheter due to chronic retention. No chest pain or palpitations.  No cough or wheezing or dyspnea.  No bleeding diathesis.   ED Course: When he came to the ER BP was 158/81 otherwise normal vital signs.  Respiratory rate was later on 25.  Labs revealed a BUN of 84 and creatinine is 2.4 close to previous levels consistent with stage IV chronic kidney disease.  CBC showed anemia with hemoglobin 9.4 hematocrit 30.3 close to his baseline.  UA was positive for UTI and urine culture will be sent. EKG as reviewed by me : Sinus rhythm with a rate of 78 with prolonged PR interval, right bundle branch block and left intrafascicular block and QTc of 462 MS. Imaging: Noncontrasted head CT scan revealed no acute intracranial normalities.  It showed age-related atrophy and chronic small vessel ischemic changes of the hemispheric white matter.  Portable chest x-ray showed no acute cardiopulmonary disease.  It showed chronic lung disease with emphysematous changes and scarring that is more advanced on the right than the left.   The patient had his Foley  catheter replaced in the ER.  He will be admitted to a medical telemetry observation bed for further evaluation and management.    Hospital Course:    Brief Narrative:  89 y.o. male with medical history significant for essential hypertension, hypothyroidism, lung cancer, stage IV CKD, osteoarthritis and chronic respiratory failure on home O2 at 2 LPM by [place] admitted on 11/15/2022 with acute metabolic encephalopathy with recurrent falls in the setting of presumed UTI in a patient with chronic indwelling Foley (POA)     -Assessment and Plan: 1)Acute Metabolic Encephalopathy--Now Resolved - due to CAUTI--POA -Prior urine culture from 10/07/2023 with multidrug-resistant Proteus Mirabilis Urine Cx from 11/16/23 with Enterococcus faecalis -Stopped IV Rocephin after 11/18/23 am dose -Started amoxicillin on 11/18/23--- okay to complete amoxicillin as prescribed   2) Enterococcus faecalis CAUTI--POA -Patient has chronic indwelling Foley catheter--POA -UTI and antibiotic choices as above #1 -Renal ultrasound without obstructive uropathy,, medical renal disease noted -Foley in situ noted on renal ultrasound with possible hemorrhage or blood clot in the bladder--clinically no Gross hematuria noted   3)Chronic diastolic CHF (congestive heart failure) (HCC) -Appears compensated,  -- Discharged on Lasix 40 mg on Mondays Wednesdays and Fridays   4)BPH with LUTs-- -Renal ultrasound without obstructive uropathy -Continue Flomax   5)Bilateral cysts --noted on renal ultrasound from 11/17/2023 --. This includes complex 3.7 cm left renal cyst which is not significantly changed compared to prior exam. Follow-up ultrasound as outpatient in 6 months is recommended to ensure stability.   6)HTN-- -c/n  amlodipine 10 mg daily, labetalol 200 mg twice daily   6)Hypothyroidism - -Continue levothyroxine   7)Chronic obstructive pulmonary disease (COPD) (HCC) -No acute exacerbation, continue bronchodilators    8)Anxiety and depression - continue  Wellbutrin XR as well as Seroquel.   9)Generalized weakness and deconditioning --PT eval appreciated recommends SNF rehab   10) chronic anemia--suspect related to underlying CKD, Hgb currently around 9 -No bleeding concerns  11)CKD stage -IV -Renal function is currently stable - renally adjust medications, avoid nephrotoxic agents / dehydration  / hypotension   Disposition: The patient is from: SNF              Anticipated d/c is to: SNF  Discharge Condition: stable  Follow UP   Follow-up Information     [name], MD. Schedule an appointment as soon as possible for a visit.   Specialty: Internal Medicine Contact information: [address] [name] [address] [phone]                 Diet and Activity recommendation:  As advised  Discharge Instructions    Discharge Instructions     Call MD for:  difficulty breathing, headache or visual disturbances   Complete by: As directed    Call MD for:  persistant dizziness or light-headedness   Complete by: As directed    Call MD for:  persistant nausea and vomiting   Complete by: As directed    Call MD for:  temperature >100.4   Complete by: As directed    Diet - low sodium heart healthy   Complete by: As directed    Discharge instructions   Complete by: As directed    1)Avoid ibuprofen/Advil/Aleve/Motrin/Goody Powders/Naproxen/BC powders/Meloxicam/Diclofenac/Indomethacin and other Nonsteroidal anti-inflammatory medications as these will make you more likely to bleed and can cause stomach ulcers, can also cause Kidney problems.   2)Repeat CBC and BMP Blood Tests in 1 week   Increase activity slowly   Complete by: As directed        Discharge Medications     Allergies as of 11/19/2023       Reactions   Nsaids Other (See Comments)   Decreased renal function        Medication List     STOP taking these medications    LORazepam 0.5 MG  tablet Commonly known as: ATIVAN   melatonin 5 MG Tabs   tadalafil 5 MG tablet Commonly known as: CIALIS       TAKE these medications    acetaminophen 325 MG tablet Commonly known as: TYLENOL Take 2 tablets (650 mg total) by mouth every 6 (six) hours as needed for mild pain (pain score 1-3), fever or headache (or Fever >/= 101). What changed: reasons to take this   amLODipine 10 MG tablet Commonly known as: NORVASC Take 1 tablet (10 mg total) by mouth daily. Hold for blood pressure reading <130   amoxicillin 500 MG capsule Commonly known as: AMOXIL Take 1 capsule (500 mg total) by mouth 2 (two) times daily for 5 days.   buPROPion 100 MG tablet Commonly known as: WELLBUTRIN Take 100 mg by mouth 2 (two) times daily.   Calcium Carbonate Antacid 200 MG Tbdp Take 200 mg by mouth daily.   ferrous sulfate 325 (65 FE) MG EC tablet Take 1 tablet (325 mg total) by mouth every Monday, Wednesday, and Friday. What changed: when to take this   fluticasone-salmeterol 250-50 MCG/ACT Aepb Commonly known as: ADVAIR Inhale 1 puff into the lungs in the morning and at bedtime.   furosemide 40 MG tablet Commonly known as: Lasix Take 1 tablet (40 mg total) by mouth every Monday, Wednesday, and Friday. What changed: when to take this   gabapentin 250 MG/5ML solution Commonly known as: NEURONTIN Take 3 mLs (150 mg total) by mouth at bedtime.   Incruse Ellipta 62.5 MCG/ACT Aepb Generic drug: umeclidinium bromide Inhale 1 puff into the lungs daily.   labetalol 200 MG tablet Commonly known as: NORMODYNE Take 1 tablet (200 mg total) by mouth 2 (two) times daily. Hold for blood pressure reading <130   levothyroxine 150 MCG tablet Commonly known as: SYNTHROID Take 1 tablet (150 mcg total) by mouth daily.   polyethylene glycol 17 g packet Commonly known as: MIRALAX / GLYCOLAX Take 17 g by mouth daily.   ProSource Liqd Take 30 mLs by mouth 3 (three) times daily.   QUEtiapine 25 MG  tablet Commonly known as: SEROQUEL Take 0.5 tablets (12.5 mg total) by mouth at bedtime.   senna-docusate 8.6-50 MG tablet Commonly known as: Senokot-S Take 2 tablets by mouth at bedtime. What changed:  how much to take when to take this   tamsulosin 0.4 MG Caps capsule Commonly known as: FLOMAX Take 1 capsule (0.4 mg total)  by mouth daily after supper. What changed: when to take this       Major procedures and Radiology Reports - PLEASE review detailed and final reports for all details, in brief -   US Renal Result Date: 11/17/2023 CLINICAL DATA:  Chronic kidney disease. EXAM: RENAL / URINARY TRACT ULTRASOUND COMPLETE COMPARISON:  July 08, 2023. FINDINGS: Right Kidney: Renal measurements: 8.8 x 4.4 x 3.5 cm = volume: 72 mL. Increased echogenicity of renal parenchyma is noted consistent with medical renal disease. 2 simple cysts are noted, the largest measuring 2.1 cm. No mass or hydronephrosis visualized. Left Kidney: Renal measurements: 11.2 x 5.7 x 6.1 cm = volume: 204 mL. Increased echogenicity of renal parenchyma is noted suggesting medical renal disease. 3.7 cm complex cyst is noted in midpole which is not significantly changed compared to prior exam. No mass or hydronephrosis visualized. Bladder: Foley catheter is noted. Complex echogenic material is noted within urinary bladder concerning for possible hemorrhage or blood clot. Other: None. IMPRESSION: Increased echogenicity of renal parenchyma is noted bilaterally consistent with medical renal disease. No hydronephrosis or renal obstruction is noted. Foley catheter is noted in urinary bladder. Complex echogenic material is noted within urinary bladder concerning for possible hemorrhage or blood clot. Bilateral cysts are noted. This includes complex 3.7 cm left renal cyst which is not significantly changed compared to prior exam. Follow-up ultrasound in 6 months is recommended to ensure stability. Electronically Signed   By: Lupita Raider M.D.   On: 11/17/2023 10:00   DG Chest Portable 1 View Result Date: 11/16/2023 CLINICAL DATA:  Altered mental status EXAM: PORTABLE CHEST 1 VIEW COMPARISON:  10/25/2023 FINDINGS: Heart size remains upper limits of normal. 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 No sign of acute infiltrate, edema, collapse or effusion. No acute bone finding. IMPRESSION: No active disease. 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 Electronically Signed   By: Paulina Fusi M.D.   On: 11/16/2023 19:38   CT Head Wo Contrast Result Date: 11/16/2023 CLINICAL DATA:  Mental status change of unknown cause. Multiple recent falls. EXAM: CT HEAD WITHOUT CONTRAST TECHNIQUE: Contiguous axial images were obtained from the base of the skull through the vertex without intravenous contrast. RADIATION DOSE REDUCTION: This exam was performed according to the departmental dose-optimization program which includes automated exposure control, adjustment of the mA and/or kV according to patient size and/or use of iterative reconstruction technique. COMPARISON:  10/25/2023 FINDINGS: Brain: No acute finding. Age related atrophy. Chronic small-vessel ischemic changes of the hemispheric white matter. No acute stroke, mass, hemorrhage, hydrocephalus or extra-axial collection. Vascular: There is atherosclerotic calcification of the major vessels at the base of the brain. Skull: Negative Sinuses/Orbits: Clear/normal Other: None IMPRESSION: No acute CT finding. Age related atrophy. Chronic small-vessel ischemic changes of the hemispheric white matter. Electronically Signed   By: Paulina Fusi M.D.   On: 11/16/2023 19:37   CT CHEST ABDOMEN PELVIS WO CONTRAST Result Date: 10/25/2023 CLINICAL DATA:  Fever.  Delirious.  Sepsis. EXAM: CT CHEST, ABDOMEN AND PELVIS WITHOUT CONTRAST TECHNIQUE: Multidetector CT imaging of the chest,  abdomen and pelvis was performed following the standard protocol without IV contrast. RADIATION DOSE REDUCTION: This exam was performed according to the departmental dose-optimization program which includes automated exposure control, adjustment of the mA and/or kV according to patient size and/or use of iterative reconstruction technique. COMPARISON:  Same day  chest radiograph; PET/CT 08/31/2023; CT chest 07/07/2023; CT chest abdomen and pelvis 04/29/2023 FINDINGS: CT CHEST FINDINGS Cardiovascular: Small pericardial effusion. Advanced aortic atherosclerotic calcification. Normal heart size. Mediastinum/Nodes: No thoracic adenopathy noting limitations of noncontrast exam and respiratory motion artifact. Trachea and esophagus are unremarkable. Lungs/Pleura: Respiratory motion obscures detail. Emphysema with bullous change in the right apex. Unchanged right upper lobe volume loss, architectural distortion, and traction bronchiectasis, presumably radiation induced. New airspace opacities in the right lower lobe posteriorly may be due to atelectasis or pneumonia. No pleural effusion or pneumothorax. Musculoskeletal: No acute fracture. Unchanged absence of a portion of the third anterolateral right rib. CT ABDOMEN PELVIS FINDINGS Hepatobiliary: Unremarkable noncontrast appearance of the liver, gallbladder, and biliary tree. Pancreas: Unremarkable. Spleen: No acute abnormality. Adrenals/Urinary Tract: Stable adrenal glands. Since PET/CT 08/31/2023 there is new moderate hydroureter in the proximal ureter. The distal ureter is not well evaluated. Increased marked left hydroureteronephrosis. No ureteral stone. Diffuse bladder wall thickening. Stomach/Bowel: Stomach is within normal limits. Paucity of intra-abdominal fat, lack of IV contrast, and motion artifact limits assessment of the bowel wall. No evidence of obstruction. Large colonic stool burden and stool ball in the rectum. Vascular/Lymphatic: Advanced aortic  atherosclerotic calcification. No definite lymphadenopathy. Reproductive: Fiducial markers along the prostate. Other: No free intraperitoneal fluid or air. Musculoskeletal: No acute fracture. IMPRESSION: 1. New airspace opacities in the right lower lobe posteriorly may be due to atelectasis or pneumonia. 2. Since PET/CT 08/31/2023 there is new moderate right hydroureter and increased left hydroureteronephrosis. No ureteral stone. Diffuse bladder wall thickening. Findings may be due to cystitis and ascending urinary tract infection. 3. Constipation with large stool ball in the rectum. Correlate for fecal impaction. Aortic Atherosclerosis (ICD10-I70.0) and Emphysema (ICD10-J43.9). Electronically Signed   By: Minerva Fester M.D.   On: 10/25/2023 21:35   CT Head Wo Contrast Result Date: 10/25/2023 CLINICAL DATA:  Mental status change.  Fever. EXAM: CT HEAD WITHOUT CONTRAST TECHNIQUE: Contiguous axial images were obtained from the base of the skull through the vertex without intravenous contrast. RADIATION DOSE REDUCTION: This exam was performed according to the departmental dose-optimization program which includes automated exposure control, adjustment of the mA and/or kV according to patient size and/or use of iterative reconstruction technique. COMPARISON:  Head CT 10/25/2023.  MRI brain 01/02/2021. FINDINGS: Brain: No evidence of acute infarction, hemorrhage, hydrocephalus, extra-axial collection or mass lesion/mass effect. Again seen is mild diffuse atrophy and moderate periventricular white matter hypodensity, likely chronic small vessel ischemic change. Vascular: Atherosclerotic calcifications are present within the cavernous internal carotid arteries. Skull: Normal. Negative for fracture or focal lesion. Sinuses/Orbits: No acute finding. Other: None. IMPRESSION: 1. No acute intracranial process. 2. Stable atrophy and chronic small vessel ischemic changes. Electronically Signed   By: Darliss Cheney M.D.   On:  10/25/2023 21:16   DG Chest Port 1 View Result Date: 10/25/2023 CLINICAL DATA:  Questionable sepsis EXAM: PORTABLE CHEST 1 VIEW COMPARISON:  Chest x-ray 10/25/2023.  CT of the chest 07/07/2023. FINDINGS: Parenchymal opacity in the right upper lobe is unchanged. Emphysema again noted. No new lung infiltrate or pleural effusion. No pneumothorax. The cardiomediastinal silhouette appears stable. IMPRESSION: 1. Parenchymal opacity in the right upper lobe is unchanged. 2. No new lung infiltrate or pleural effusion. Electronically Signed   By: Darliss Cheney M.D.   On: 10/25/2023 20:48   CT Cervical Spine Wo Contrast Result Date: 10/25/2023 CLINICAL DATA:  89 year old male status post fall from bed. Found down. EXAM: CT CERVICAL SPINE WITHOUT  CONTRAST TECHNIQUE: Multidetector CT imaging of the cervical spine was performed without intravenous contrast. Multiplanar CT image reconstructions were also generated. RADIATION DOSE REDUCTION: This exam was performed according to the departmental dose-optimization program which includes automated exposure control, adjustment of the mA and/or kV according to patient size and/or use of iterative reconstruction technique. COMPARISON:  Head CT today.  No prior cervical spine CT. FINDINGS: Alignment: Maintained cervical lordosis. Cervicothoracic junction alignment is within normal limits. Bilateral posterior element alignment is within normal limits. Skull base and vertebrae: Mild motion artifact, less than on the head CT today. Visualized skull base is intact. No atlanto-occipital dissociation. C1 and C2 appear intact and aligned. Heterogeneous bone mineralization although stable compared to chest CT 07/07/2023. No acute osseous abnormality identified. Soft tissues and spinal canal: No prevertebral fluid or swelling. No visible canal hematoma. Calcified cervical carotid atherosclerosis. Disc levels: Generally mild for age, age-appropriate cervical spine degeneration. Up to mild  associated cervical spinal stenosis. Upper chest: Emphysema and architectural distortion in the lung apices. Visible upper thoracic levels appear intact. IMPRESSION: 1. No acute traumatic injury identified in the cervical spine. 2. Generally mild for age cervical spine degeneration. 3.  Emphysema (ICD10-J43.9). Electronically Signed   By: Odessa Fleming M.D.   On: 10/25/2023 08:55   CT Head Wo Contrast Result Date: 10/25/2023 CLINICAL DATA:  89 year old male status post fall from bed. Found down. EXAM: CT HEAD WITHOUT CONTRAST TECHNIQUE: Contiguous axial images were obtained from the base of the skull through the vertex without intravenous contrast. RADIATION DOSE REDUCTION: This exam was performed according to the departmental dose-optimization program which includes automated exposure control, adjustment of the mA and/or kV according to patient size and/or use of iterative reconstruction technique. COMPARISON:  Brain MRI 01/02/2021.  Head CT 10/07/2023. FINDINGS: Mildly motion degraded exam today. Brain: Stable cerebral volume. No midline shift, ventriculomegaly, mass effect, evidence of mass lesion, intracranial hemorrhage or evidence of cortically based acute infarction. Stable gray-white matter differentiation throughout the brain. Patchy bilateral periventricular is stable. Vascular: Calcified atherosclerosis at the skull base. No suspicious intracranial vascular hyperdensity. Skull: Stable, intact. Sinuses/Orbits: Visualized paranasal sinuses and mastoids are stable and well aerated. Other: Postoperative changes to both globes. No acute orbit or scalp soft tissue injury is identified. IMPRESSION: Mildly motion degraded exam with No acute intracranial abnormality or acute traumatic injury identified. Electronically Signed   By: Odessa Fleming M.D.   On: 10/25/2023 08:48   DG Chest Portable 1 View Result Date: 10/25/2023 CLINICAL DATA:  89 year old male with history of altered mental status. EXAM: PORTABLE CHEST 1 VIEW  COMPARISON:  Chest x-ray 10/07/2023. FINDINGS: Chronic mass-like area of architectural distortion in the periphery of the right upper lobe with overlying of architectural distortion in the adjacent ribs, similar to the prior study, most compatible with an area of chronic postradiation mass-like fibrosis. Lungs otherwise appear clear. No pleural effusions. No pneumothorax. No evidence of pulmonary edema. Heart size is normal. Upper mediastinal contours are within normal limits. Atherosclerotic calcifications are noted in the thoracic aorta. IMPRESSION: 1. No radiographic evidence of acute cardiopulmonary disease. 2. Chronic postradiation changes in the right lung, similar to prior studies. Electronically Signed   By: Trudie Reed M.D.   On: 10/25/2023 08:05   Micro Results   Recent Results (from the past 240 hours)  Urine Culture     Status: Abnormal   Collection Time: 11/16/23  9:40 PM   Specimen: Urine, Catheterized  Result Value Ref Range Status  Specimen Description   Final    URINE, CATHETERIZED Performed at [facility], [address], [place], [place] [address]    Special Requests   Final    NONE Performed at [facility], [address] [place], [place] [address]    Culture 20,000 COLONIES/mL ENTEROCOCCUS FAECALIS (A)  Final   Report Status 11/19/2023 FINAL  Final   Organism ID, Bacteria ENTEROCOCCUS FAECALIS (A)  Final      Susceptibility   Enterococcus faecalis - MIC*    AMPICILLIN <=2 SENSITIVE Sensitive     NITROFURANTOIN <=16 SENSITIVE Sensitive     VANCOMYCIN 1 SENSITIVE Sensitive     * 20,000 COLONIES/mL ENTEROCOCCUS FAECALIS  MRSA Next Gen by PCR, Nasal     Status: None   Collection Time: 11/17/23  6:02 PM   Specimen: Nasal Mucosa; Nasal Swab  Result Value Ref Range Status   MRSA by PCR Next Gen NOT DETECTED NOT DETECTED Final    Comment: (NOTE) The GeneXpert MRSA Assay (FDA approved for NASAL specimens only), is one component of a comprehensive MRSA colonization  surveillance program. It is not intended to diagnose MRSA infection nor to guide or monitor treatment for MRSA infections. Test performance is not FDA approved in patients less than 61 years old. Performed at [facility], [address], [place], [place] [address]    Today   Subjective    [name] today has no new complaints  No fever  Or chills   No Nausea, Vomiting or Diarrhea -Alert, oriented and cooperative -Eating and drinking well         Patient has been seen and examined prior to discharge   Objective   Blood pressure (!) 155/80, pulse 71, temperature 98.2 F (36.8 C), resp. rate 12, height 6\' 2"  (1.88 m), weight 59 kg, SpO2 94%.   Intake/Output Summary (Last 24 hours) at 11/19/2023 1245 Last data filed at 11/19/2023 0912 Gross per 24 hour  Intake 1160.09 ml  Output 1500 ml  Net -339.91 ml    Exam Gen:- Awake Alert, no acute distress  HEENT:- [place].AT, No sclera icterus Neck-Supple Neck,No JVD,.  Lungs-  CTAB , good air movement bilaterally CV- S1, S2 normal, regular Abd-  +ve B.Sounds, Abd Soft, No tenderness, no CVA area tenderness Extremity/Skin:- No  edema, pedal pulses present , Vitiligo Psych-affect is appropriate, oriented x2, underlying memory and cognitive deficits Neuro-generalized weakness, no new focal deficits, no tremors GU--Foley in situ with clear urine (POA-Chronic foley)   Data Review   CBC w Diff:  Lab Results  Component Value Date   WBC 4.0 11/17/2023   HGB 8.9 (L) 11/17/2023   HGB 9.1 (L) 08/12/2023   HCT 29.5 (L) 11/17/2023   PLT 175 11/17/2023   PLT 181 08/12/2023   LYMPHOPCT 23 11/16/2023   MONOPCT 9 11/16/2023   EOSPCT 0 11/16/2023   BASOPCT 0 11/16/2023   CMP:  Lab Results  Component Value Date   NA 140 11/18/2023   K 4.5 11/18/2023   CL 113 (H) 11/18/2023   CO2 19 (L) 11/18/2023   BUN 70 (H) 11/18/2023   CREATININE 2.19 (H) 11/18/2023   CREATININE 2.51 (H) 08/12/2023   PROT 7.4 11/16/2023   ALBUMIN 2.5 (L)  11/18/2023   BILITOT 0.8 11/16/2023   BILITOT 0.4 08/12/2023   ALKPHOS 75 11/16/2023   AST 24 11/16/2023   AST 7 (L) 08/12/2023   ALT 32 11/16/2023   ALT 13 08/12/2023   Total Discharge time is about  33 minutes  Shon Hale M.D on 11/19/2023 at 12:45 PM  Go to [email or web address] -  for contact info  [facility] - Office  [phone]

## 2023-11-22 ENCOUNTER — Non-Acute Institutional Stay (SKILLED_NURSING_FACILITY): Payer: Self-pay | Admitting: Adult Health

## 2023-11-22 ENCOUNTER — Encounter: Payer: Self-pay | Admitting: Adult Health

## 2023-11-22 DIAGNOSIS — E039 Hypothyroidism, unspecified: Secondary | ICD-10-CM

## 2023-11-22 DIAGNOSIS — I5032 Chronic diastolic (congestive) heart failure: Secondary | ICD-10-CM

## 2023-11-22 DIAGNOSIS — C3411 Malignant neoplasm of upper lobe, right bronchus or lung: Secondary | ICD-10-CM | POA: Diagnosis not present

## 2023-11-22 DIAGNOSIS — J9611 Chronic respiratory failure with hypoxia: Secondary | ICD-10-CM | POA: Diagnosis not present

## 2023-11-22 DIAGNOSIS — E43 Unspecified severe protein-calorie malnutrition: Secondary | ICD-10-CM

## 2023-11-22 DIAGNOSIS — N400 Enlarged prostate without lower urinary tract symptoms: Secondary | ICD-10-CM | POA: Diagnosis not present

## 2023-11-22 DIAGNOSIS — R1310 Dysphagia, unspecified: Secondary | ICD-10-CM | POA: Diagnosis not present

## 2023-11-22 DIAGNOSIS — I110 Hypertensive heart disease with heart failure: Secondary | ICD-10-CM | POA: Diagnosis not present

## 2023-11-22 DIAGNOSIS — N390 Urinary tract infection, site not specified: Secondary | ICD-10-CM

## 2023-11-22 DIAGNOSIS — I129 Hypertensive chronic kidney disease with stage 1 through stage 4 chronic kidney disease, or unspecified chronic kidney disease: Secondary | ICD-10-CM | POA: Diagnosis not present

## 2023-11-22 DIAGNOSIS — N184 Chronic kidney disease, stage 4 (severe): Secondary | ICD-10-CM

## 2023-11-22 DIAGNOSIS — I700 Atherosclerosis of aorta: Secondary | ICD-10-CM

## 2023-11-22 DIAGNOSIS — F01B18 Vascular dementia, moderate, with other behavioral disturbance: Secondary | ICD-10-CM

## 2023-11-22 DIAGNOSIS — J449 Chronic obstructive pulmonary disease, unspecified: Secondary | ICD-10-CM

## 2023-11-22 NOTE — Progress Notes (Signed)
 Location:  [facility] Room Number: 148 Place of Service:  SNF (31)   CODE STATUS: dnr   Allergies  Allergen Reactions   Nsaids Other (See Comments)    Decreased renal function    Chief Complaint  Patient presents with   Hospitalization Follow-up    HPI:  He is a 89 year old man who has been hospitalized from 11-16-23 through 11-19-23. His past medical history includes: hypertension; hypothyroidism; lung cancer; stage 4 chronic kidney disease; osteoarthritis; chronic respiratory failure on 02.  He presented to the ED with acute onset of altered mental status with confusion and generalized weakness. He had had falls as well. He did have a period of unresponsiveness on the day of admission.  He was treated for metabolic encephalopathy; acute cystitis with hematuria. He was treated for enterococcus faecalis and will need 5 more days of amoxicillin. At this time, his goal is to return back home; however; that may not be a possibility for him. He will continue to be followed for his chronic illnesses including:  Benign hypertension with coincident congestive heart failure /  benign hypertension with chronic kidney disease stage IV: Chronic respiratory failure with hypoxia / COPD (chronic obstructive pulmonary disease)  Primary lung cancer right upper lobe stage IVA    Past Medical History:  Diagnosis Date   Arthritis    Hypertension    Hyperthyroidism    lung ca 11/2020    Past Surgical History:  Procedure Laterality Date   COLONOSCOPY N/A 09/02/2016   Procedure: COLONOSCOPY;  Surgeon: [name], MD;  [facility];  Service: Endoscopy;  Laterality: N/A;  830   NO PAST SURGERIES     POLYPECTOMY  09/02/2016   Procedure: POLYPECTOMY;  Surgeon: [name], MD;  Location: [facility];  Service: Endoscopy;;    Social History   Socioeconomic History   Marital status: Divorced    Spouse name: Not on file   Number of children: Not on file    Years of education: Not on file   Highest education level: Not on file  Occupational History   Not on file  Tobacco Use   Smoking status: Former    Current packs/day: 0.00    Average packs/day: 1.5 packs/day for 40.0 years (60.0 ttl pk-yrs)    Types: Cigarettes    Start date: 50    Quit date: 2000    Years since quitting: 25.1   Smokeless tobacco: Never  Vaping Use   Vaping status: Never Used  Substance and Sexual Activity   Alcohol use: No   Drug use: No   Sexual activity: Not on file  Other Topics Concern   Not on file  Social History Narrative   Not on file   Social Drivers of Health   Financial Resource Strain: Not on file  Food Insecurity: No Food Insecurity (11/17/2023)   Hunger Vital Sign    Worried About Running Out of Food in the Last Year: Never true    Ran Out of Food in the Last Year: Never true  Transportation Needs: No Transportation Needs (11/17/2023)   PRAPARE - Administrator, Civil Service (Medical): No    Lack of Transportation (Non-Medical): No  Physical Activity: Not on file  Stress: Not on file  Social Connections: Unknown (11/17/2023)   Social Connection and Isolation Panel [NHANES]    Frequency of Communication with Friends and Family: More than three times a week  Frequency of Social Gatherings with Friends and Family: More than three times a week    Attends Religious Services: Patient declined    Active Member of Clubs or Organizations: No    Attends Banker Meetings: Never    Marital Status: Patient declined  Intimate Partner Violence: Not At Risk (11/17/2023)   Humiliation, Afraid, Rape, and Kick questionnaire    Fear of Current or Ex-Partner: No    Emotionally Abused: No    Physically Abused: No    Sexually Abused: No   Family History  Problem Relation Age of Onset   Colon cancer Other       VITAL SIGNS BP (!) 160/88   Pulse 76   Temp (!) 97.4 F (36.3 C)   Resp 20   Ht 6\' 2"  (1.88 m)   Wt 135 lb  (61.2 kg)   SpO2 99%   BMI 17.33 kg/m   Outpatient Encounter Medications as of 11/22/2023  Medication Sig   acetaminophen (TYLENOL) 325 MG tablet Take 2 tablets (650 mg total) by mouth every 6 (six) hours as needed for mild pain (pain score 1-3), fever or headache (or Fever >/= 101).   amLODipine (NORVASC) 10 MG tablet Take 1 tablet (10 mg total) by mouth daily. Hold for blood pressure reading <130   amoxicillin (AMOXIL) 500 MG capsule Take 1 capsule (500 mg total) by mouth 2 (two) times daily for 5 days.   buPROPion (WELLBUTRIN) 100 MG tablet Take 100 mg by mouth 2 (two) times daily.   Calcium Carbonate Antacid 200 MG TBDP Take 200 mg by mouth daily.   ferrous sulfate 325 (65 FE) MG EC tablet Take 1 tablet (325 mg total) by mouth every Monday, Wednesday, and Friday.   fluticasone-salmeterol (ADVAIR) 250-50 MCG/ACT AEPB Inhale 1 puff into the lungs in the morning and at bedtime.   furosemide (LASIX) 40 MG tablet Take 1 tablet (40 mg total) by mouth every Monday, Wednesday, and Friday.   gabapentin (NEURONTIN) 250 MG/5ML solution Take 3 mLs (150 mg total) by mouth at bedtime.   labetalol (NORMODYNE) 200 MG tablet Take 1 tablet (200 mg total) by mouth 2 (two) times daily. Hold for blood pressure reading <130   levothyroxine (SYNTHROID) 150 MCG tablet Take 1 tablet (150 mcg total) by mouth daily.   Nutritional Supplements (PROSOURCE) LIQD Take 30 mLs by mouth 3 (three) times daily.   polyethylene glycol (MIRALAX / GLYCOLAX) 17 g packet Take 17 g by mouth daily.   QUEtiapine (SEROQUEL) 25 MG tablet Take 0.5 tablets (12.5 mg total) by mouth at bedtime.   senna-docusate (SENOKOT-S) 8.6-50 MG tablet Take 2 tablets by mouth at bedtime.   tamsulosin (FLOMAX) 0.4 MG CAPS capsule Take 1 capsule (0.4 mg total) by mouth daily after supper.   umeclidinium bromide (INCRUSE ELLIPTA) 62.5 MCG/ACT AEPB Inhale 1 puff into the lungs daily.   No facility-administered encounter medications on file as of 11/22/2023.      SIGNIFICANT DIAGNOSTIC EXAMS   PREVIOUS   07-07-23: wbc 5.3; hgb 10.2; hct 32.9; mcv 95.9 plt 167; glucose 98; bun 26; creat 2.04; k+ 3.9; na++ 137; ca 8.0; gfr 31; protein 7.3 albumin 2.7 mag 1.9 07-08-23: tsh 9.279 vitamin B12: 479; folate 7.5; iron 21; tibc 168 07-15-23: wbc 3.9; hgb 8.3; hct 27.1; mcv 98.2 plt 181; glucose 105; bun 39; creat 2.23; k+ 4.3; na++ 137; ca 7.7; gfr 28; mag 1.8  07-20-23: wbc 4.5; hgb 8.1; hct 26.4; mcv 100.4 plt 175; glucose  93; bun 45; creat 2.08; k+ 4.3; na++ 138; ca 7.5; gfr 30 protein 5.5 albumin 2.1; tsh 6.070 07-26-23: wbc 3.6; hgb 8.3; hct 26.7; mcv 99.6 plt 178; glucose 86; bun 50; creat 2.18; k+ 4.8; na++ 139; ca 7.8; gfr 29; protein 6.0 albumin 2.2 iron 33 tibc 201 08-20-23: wbc 5.0; hgb 9.9; hct 31.9; mcv 97.3 plt 210; glucose 94; bun 74; creat 3.13; k+ 4.6; na++ 132; ca 8.3; gfr 19; protein 7.5 albumin 2.7  08-24-23: glucose 111; bun 68; creat 2.67; k+ 4.1; na++ 136; ca 7.8; gfr 22 10-07-23: wbc 5.3; hgb 10.5; hct 32.5; mcv 90.3 plt 168; glucose 84; bun 66; creat 3.78; k+ 4.2; na++ 134; ca 8.7; gfr 15; protein 7.8; albumin 3.1; urine culture: proteus mirabilis  10-09-23: wbc 4.2; hgb 8.8; hct 26.8; mcv 89.0 plt 165; glucose 100; bun 66; creat 2.72; k+ 3.5; na++ 131; ca 8.1; gfr 22; mag 1.7 10-25-23: wbc 7.2; hgb 9.9; hct 32.1; mcv 93.0 plt 235; glucose 91; bun 101; creat 3.09; k+ 5.2; na++ 140 ca; 8.7; gfr 1.9 protein 7.4 albumin 3.2 ; mag 2.3 urine culture: mixed; blood culture no growth.   TODAY   11-08-23: hgb 8.6; hct 28.4 glucose 82; bun 60; creat 2.33 k+ 5.3 albumin 137; ca 8.6 gfr 26 protein 6.6 albumin 2.7  11-09-23: k+ 5.2  11-16-23: wbc 4.7; hgb 9,4; hct 30.3; mcv 95.6 plt 203; glucose 101; bun 84; creat 2.44; k+ 5.0; na++ 138; ca 8.5 gfr 25; albumin 3.2 protein 7.4 urine culture: 20,000 colonies 11-18-23: glucose 109; bun 70; creat 2.19; k+ 4.5; na++ 140; ca 7.9; gfr 28; protein 2.5 phos 3.3   Review of Systems  Constitutional:  Negative  for malaise/fatigue.  Respiratory:  Negative for cough and shortness of breath.   Cardiovascular:  Negative for chest pain, palpitations and leg swelling.  Gastrointestinal:  Negative for abdominal pain, constipation and heartburn.  Musculoskeletal:  Negative for back pain, joint pain and myalgias.  Skin: Negative.   Neurological:  Negative for dizziness.  Psychiatric/Behavioral:  The patient is not nervous/anxious.    Physical Exam Constitutional:      General: He is not in acute distress.    Appearance: He is well-developed. He is not diaphoretic.  Neck:     Thyroid: No thyromegaly.  Cardiovascular:     Rate and Rhythm: Normal rate and regular rhythm.     Pulses: Normal pulses.     Heart sounds: Normal heart sounds.  Pulmonary:     Effort: Pulmonary effort is normal. No respiratory distress.     Breath sounds: Normal breath sounds.  Abdominal:     General: Bowel sounds are normal. There is no distension.     Palpations: Abdomen is soft.     Tenderness: There is no abdominal tenderness.  Genitourinary:    Comments: foley Musculoskeletal:        General: Normal range of motion.     Cervical back: Neck supple.     Right lower leg: Edema present.     Left lower leg: Edema present.  Lymphadenopathy:     Cervical: No cervical adenopathy.  Skin:    General: Skin is warm and dry.  Neurological:     Mental Status: He is alert. Mental status is at baseline.  Psychiatric:        Mood and Affect: Mood normal.     ASSESSMENT/ PLAN:  Acute cystitis will complete 5 more days of amoxicillin 500 mg twice daily   2. Benign  hypertension with coincident congestive heart failure /  benign hypertension with chronic kidney disease stage IV: b/p 140/82 will continue norvasc 10 mg daily and labetolol 200 mg twice daily   3. Chronic respiratory failure with hypoxia / COPD (chronic obstructive pulmonary disease) will continue  advair 250/50 1 puff twice daily; incruse 62.5 mcg 1 puff daily    4. Primary lung cancer right upper lobe stage IVA is followed by Dr. Shirline Frees s/p chemo, radiation, immunotherapy. Is presently being observed.   5. Acquired hypothyroidism: tsh 9.279 will continue synthroid 150 mcg daily   6. Benign prostatic hyperplasia without symptoms: currently has foley; needed to stop his flomax; his meds need to be crushed.   7. Anemia of chronic disease: hgb 9.4; iron 21; will continue iron three times weekly   8. Chronic pain syndrome: will continue gabapentin 150 mg daily   9.  Severe protein calorie malnutrition: albumin 2.5 will continue prosource 30 mL and ensure three times daily   10. Chronic diastolic congestive heart failure: is presently taking lasix 40 mg three days weekly    11. Depression, chronic: will continue wellbutrin 100 mg twice daily;   seroquel 12.5 mg nightly     12. Aortic atherosclerosis (ct 07-07-23) is not on statin due to advanced age.   13. Chronic kidney disease stage 4: bun 62; creat 2.66 gfr 22   14. Dysphagia: is on D2 diet with thin liquids and crushed medications.   15. Chronic constipation: will continue senna s 2 tabs nightly    Synthia Innocent NP [facility]   call [phone]

## 2023-11-23 ENCOUNTER — Non-Acute Institutional Stay (SKILLED_NURSING_FACILITY): Payer: Self-pay | Admitting: Internal Medicine

## 2023-11-23 ENCOUNTER — Encounter: Payer: Self-pay | Admitting: Internal Medicine

## 2023-11-23 DIAGNOSIS — F0150 Vascular dementia without behavioral disturbance: Secondary | ICD-10-CM | POA: Insufficient documentation

## 2023-11-23 DIAGNOSIS — D508 Other iron deficiency anemias: Secondary | ICD-10-CM | POA: Diagnosis not present

## 2023-11-23 DIAGNOSIS — G9341 Metabolic encephalopathy: Secondary | ICD-10-CM | POA: Diagnosis not present

## 2023-11-23 DIAGNOSIS — F01B18 Vascular dementia, moderate, with other behavioral disturbance: Secondary | ICD-10-CM | POA: Diagnosis not present

## 2023-11-23 DIAGNOSIS — M7541 Impingement syndrome of right shoulder: Secondary | ICD-10-CM | POA: Diagnosis not present

## 2023-11-23 NOTE — Assessment & Plan Note (Signed)
 He has had repeated episodes of acute encephalopathy.  He has also had several falls.  TSH, B12, and RPR/VDRL will be performed to see if there is a reversible component to his neurocognitive issues.

## 2023-11-23 NOTE — Progress Notes (Deleted)
 Name: [name] DOB: [birth year] MRN: [id number]  History of Present Illness: [name] is a 89 y.o. male who presents today for follow up visit at [facility] [place]. He is accompanied by ***, who assists with providing history due to patient's dementia. - GU history: 1. Prostate cancer. - 07/24/2015: Prostate biopsy by [name] (pathology positive for prostate adenocarcinoma). - 11/27/2015: Underwent gold seed placement at [facility] prior to IMRT in [place].  - No PSA values within past 10 years found per chart review. 2. Intermittent urge incontinence. 3. Bilateral renal cysts. 4. CKD stage 4. Followed by Nephrology [facility]).  5. Erectile dysfunction.   Recent history: > 10/25/2023 - 10/29/2023:  - Admitted for pneumonia and uremia from AKI.  - Creatinine elevated to 3.09 upon admission. His baseline creatinine is 2.5-2.7. - Initially suspected to have UTI based on urinalysis with moderate leukocytes, however urine culture came back with multiple species present.  - 10/25/2023: CT abdomen/pelvis notable for moderate right hydroureter with increased left hydroureteronephrosis and diffuse bladder wall thickening.   - 10/27/2023: Bladder scan showed urinary retention (PVR = 517 mL).  - 10/28/2023: Foley catheter placed by urology ([name]). - Creatinine improved to 2.66 at time of discharge.   - Advised to continue Flomax (Tamsulosin) 0.4 mg daily.  > 11/11/2023: Urology visit. Passed voiding trial.  The plan was: 1. Foley catheter removed.  2. Staff at [facility] instructed to assess his post-void residual (PVR) later today around dinner time either via bladder scan or I&O cath. If PVR is >200 ml: Replace indwelling Foley catheter and notify [facility] [place].  3. Increase Flomax (Tamsulosin) 0.4 mg to BID (2x/day). 4. Staff at [facility] are instructed to encourage hydration with water at least once every 2-4 hours  while patient is awake.  5. Return in about 2 weeks (around 11/25/2023) for UA, PVR, & f/u with [name] with [facility] prior.  Since last visit: > 11/15/2023: Sent from SNF to ER for urinary retention for catheter placement.   > 11/16/2023 - 11/19/2023: - Admitted for altered mental status / confusion / weakness / multiple falls. Diagnosed with metabolic encephalopathy secondary to Enterococcus faecalis CAUTI. Treated with Amoxicillin. - His Foley catheter replaced in the ER.   Today: He reports the catheter is draining ***.  He {Actions; denies-reports:120008} gross hematuria.  He {Actions; denies-reports:120008} flank pain or abdominal pain. He {Actions; denies-reports:120008} fevers, nausea, or vomiting.  ***recommend that he continue with catheter long-term due to failed voiding trial / high risk for recurrence of urinary retention if catheter is removed. He is not a candidate for CIC.   Fall Screening: Do you usually have a device to assist in your mobility? {yes/no:20286} ***cane / ***walker / ***wheelchair   Medications: Current Outpatient Medications  Medication Sig Dispense Refill   acetaminophen (TYLENOL) 325 MG tablet Take 2 tablets (650 mg total) by mouth every 6 (six) hours as needed for mild pain (pain score 1-3), fever or headache (or Fever >/= 101).     amLODipine (NORVASC) 10 MG tablet Take 1 tablet (10 mg total) by mouth daily. Hold for blood pressure reading <130 30 tablet 5   amoxicillin (AMOXIL) 500 MG capsule Take 1 capsule (500 mg total) by mouth 2 (two) times daily for 5 days. 10 capsule 0   buPROPion (WELLBUTRIN) 100 MG tablet Take 100 mg by mouth 2 (two) times daily.     Calcium Carbonate Antacid  200 MG TBDP Take 200 mg by mouth daily.     ferrous sulfate 325 (65 FE) MG EC tablet Take 1 tablet (325 mg total) by mouth every Monday, Wednesday, and Friday. 12 tablet 3   fluticasone-salmeterol (ADVAIR) 250-50 MCG/ACT AEPB Inhale 1 puff into the lungs in the  morning and at bedtime. 60 each 3   furosemide (LASIX) 40 MG tablet Take 1 tablet (40 mg total) by mouth every Monday, Wednesday, and Friday. 12 tablet 3   gabapentin (NEURONTIN) 250 MG/5ML solution Take 3 mLs (150 mg total) by mouth at bedtime. 90 mL 0   labetalol (NORMODYNE) 200 MG tablet Take 1 tablet (200 mg total) by mouth 2 (two) times daily. Hold for blood pressure reading <130 60 tablet 4   levothyroxine (SYNTHROID) 150 MCG tablet Take 1 tablet (150 mcg total) by mouth daily. 30 tablet 0   Nutritional Supplements (PROSOURCE) LIQD Take 30 mLs by mouth 3 (three) times daily.     polyethylene glycol (MIRALAX / GLYCOLAX) 17 g packet Take 17 g by mouth daily.     QUEtiapine (SEROQUEL) 25 MG tablet Take 0.5 tablets (12.5 mg total) by mouth at bedtime. 15 tablet 0   senna-docusate (SENOKOT-S) 8.6-50 MG tablet Take 2 tablets by mouth at bedtime. 60 tablet 4   tamsulosin (FLOMAX) 0.4 MG CAPS capsule Take 1 capsule (0.4 mg total) by mouth daily after supper. 30 capsule 5   umeclidinium bromide (INCRUSE ELLIPTA) 62.5 MCG/ACT AEPB Inhale 1 puff into the lungs daily. 30 each 2   No current facility-administered medications for this visit.    Allergies: Allergies  Allergen Reactions   Nsaids Other (See Comments)    Decreased renal function    Past Medical History:  Diagnosis Date   Arthritis    Hypertension    Hyperthyroidism    lung ca 11/2020   Past Surgical History:  Procedure Laterality Date   COLONOSCOPY N/A 09/02/2016   Procedure: COLONOSCOPY;  Surgeon: Brian Hippo, MD;  [facility];  Service: Endoscopy;  Laterality: N/A;  830   NO PAST SURGERIES     POLYPECTOMY  09/02/2016   Procedure: POLYPECTOMY;  Surgeon: Brian Hippo, MD;  [facility];  Service: Endoscopy;;   Family History  Problem Relation Age of Onset   Colon cancer Other    Social History   Socioeconomic History   Marital status: Divorced    Spouse name: Not on file   Number of  children: Not on file   Years of education: Not on file   Highest education level: Not on file  Occupational History   Not on file  Tobacco Use   Smoking status: Former    Current packs/day: 0.00    Average packs/day: 1.5 packs/day for 40.0 years (60.0 ttl pk-yrs)    Types: Cigarettes    Start date: 12    Quit date: 2000    Years since quitting: 25.1   Smokeless tobacco: Never  Vaping Use   Vaping status: Never Used  Substance and Sexual Activity   Alcohol use: No   Drug use: No   Sexual activity: Not on file  Other Topics Concern   Not on file  Social History Narrative   Not on file   Social Drivers of Health   Financial Resource Strain: Not on file  Food Insecurity: No Food Insecurity (11/17/2023)   Hunger Vital Sign    Worried About Running Out of Food in the Last Year: Never  true    Ran Out of Food in the Last Year: Never true  Transportation Needs: No Transportation Needs (11/17/2023)   PRAPARE - Administrator, Civil Service (Medical): No    Lack of Transportation (Non-Medical): No  Physical Activity: Not on file  Stress: Not on file  Social Connections: Unknown (11/17/2023)   Social Connection and Isolation Panel [NHANES]    Frequency of Communication with Friends and Family: More than three times a week    Frequency of Social Gatherings with Friends and Family: More than three times a week    Attends Religious Services: Patient declined    Database administrator or Organizations: No    Attends Banker Meetings: Never    Marital Status: Patient declined  Catering manager Violence: Not At Risk (11/17/2023)   Humiliation, Afraid, Rape, and Kick questionnaire    Fear of Current or Ex-Partner: No    Emotionally Abused: No    Physically Abused: No    Sexually Abused: No    Review of Systems Constitutional: Patient denies any unintentional weight loss or change in strength lntegumentary: Patient denies any rashes or  pruritus Cardiovascular: Patient denies chest pain or syncope Respiratory: Patient denies shortness of breath Gastrointestinal: Patient ***denies nausea, vomiting, constipation, or diarrhea Musculoskeletal: Patient denies muscle cramps or weakness Neurologic: Patient denies convulsions or seizures Allergic/Immunologic: Patient denies recent allergic reaction(s) Hematologic/Lymphatic: Patient denies bleeding tendencies Endocrine: Patient denies heat/cold intolerance  GU: As per HPI.  OBJECTIVE There were no vitals filed for this visit. There is no height or weight on file to calculate BMI.  Physical Examination Constitutional: No obvious distress; patient is non-toxic appearing  Cardiovascular: No visible lower extremity edema.  Respiratory: The patient does not have audible wheezing/stridor; respirations do not appear labored  Gastrointestinal: Abdomen non-distended Musculoskeletal: Normal ROM of UEs  Skin: No obvious rashes/open sores  Neurologic: CN 2-12 grossly intact Psychiatric: Answered questions appropriately with normal affect  Hematologic/Lymphatic/Immunologic: No obvious bruises or sites of spontaneous bleeding  UA:  ***positive for *** leukocytes, *** blood, ***nitrites ***Urine microscopy:  ***negative  *** WBC/hpf, *** RBC/hpf, *** bacteria ***with no evidence of UTI ***with no evidence of microscopic hematuria ***otherwise unremarkable ***glucosuria (secondary to ***Jardiance ***Farxiga use)  PVR: *** ml  ASSESSMENT No diagnosis found. ***  We agreed to plan for follow up in *** months / ***1 year or sooner if needed. Patient verbalized understanding of and agreement with current plan. All questions were answered.  PLAN Advised the following: 1. *** 2. ***No follow-ups on file.  No orders of the defined types were placed in this encounter.   It has been explained that the patient is to follow regularly with their PCP in addition to all other  providers involved in their care and to follow instructions provided by these respective offices. Patient advised to contact urology clinic if any urologic-pertaining questions, concerns, new symptoms or problems arise in the interim period.  There are no Patient Instructions on file for this visit.  Electronically signed by:  Donnita Falls, FNP   11/23/23    4:33 PM

## 2023-11-23 NOTE — Assessment & Plan Note (Signed)
 Update TSH, B12 level & add VDRL/RPR.

## 2023-11-23 NOTE — Progress Notes (Unsigned)
 NURSING HOME LOCATION:  [facility] ROOM NUMBER:  [id number]  CODE STATUS:  DNR  PCP: [name], MD   This is a nursing facility follow up visit for Nursing Facility readmission within 30 days.  Interim medical record and care since last SNF visit was updated with review of diagnostic studies and change in clinical status since last visit were documented.  HPI: He was rehospitalized 2/18 - 11/19/2023 with metabolic encephalopathy in the context of acute cystitis with hematuria.  He was referred from the SNF for acute altered mental status with confusion and generalized weakness associated with several falls since Sunday, 2/16.  The day of admission he had an episode of unresponsiveness.  These findings were in the context of a chronic indwelling Foley catheter due to chronic retention. Initially systolic hypertension was present with a blood pressure 158/81.  Tachypnea was present with respiratory rate of 25. Labs revealed BUN of 84 and creatinine of 2.44 & GFR of 25 consistent with stage IV CKD.  Normochromic, normocytic anemia was present with H/H of 9.4/30.3.  Final H/H was 8.9/29.5.  White count & differential were normal. CT head scan without contrast revealed no acute intracranial findings.  There is age-related atrophy and chronic small vessel ischemic changes of the hemispheric white matter. Portable chest x-ray revealed emphysematous changes and scarring asymmetrically. Renal ultrasound revealed no obstructive uropathy.  Bilateral cysts were present including a complex 3.7 cm left renal cyst ,stable compared to prior exams.  Possible hemorrhage or blood clot in the bladder was suggested.  Clinically there was no gross hematuria. In the ED Foley catheter was replaced; urine was sent for C&S.  Final results revealed Enterococcus faecalis but only 20,000 colonies. Rocephin was transitioned to amoxicillin. Final creatinine was 2.19 and GFR 28 indicating high stage IV  CKD. He has a diagnosis of hypothyroidism; the most recent TSH was 6.070, indicating suboptimal supplementation.  This actually had improved compared to the prior value of 9.279.  B12 level was also last checked in October 2024 and was normal at 479.  There is no RPR/VDRL on record. He was deemed stable enough to return to the SNF.  Review of systems: Dementia invalidated responses.  He was unable to provide the dates of hospitalization.  He stated that he had been in the hospital because of "kidney failing."  He validates some exertional dyspnea.  He also apparently indicates he has dysuria.  He describes diarrhea as loose stool.  His major complaint is pain in the right shoulder area with elevation.  For the most part responses are mumbled and almost incoherent.   Constitutional: No fever, significant weight change  Eyes: No redness, discharge, pain, vision change ENT/mouth: No nasal congestion,  purulent discharge, earache, change in hearing, sore throat  Cardiovascular: No chest pain, palpitations, paroxysmal nocturnal dyspnea, claudication, edema  Respiratory: No cough, sputum production, hemoptysis, significant snoring, apnea   Gastrointestinal: No heartburn, dysphagia, abdominal pain, nausea /vomiting, rectal bleeding, melena Genitourinary: No hematuria, pyuria, incontinence, nocturia Dermatologic: No rash, pruritus, change in appearance of skin Neurologic: No dizziness, headache, syncope, seizures, numbness, tingling Psychiatric: No significant anxiety, depression, insomnia, anorexia Endocrine: No change in hair/skin/nails, excessive thirst, excessive hunger, excessive urination  Hematologic/lymphatic: No significant bruising, lymphadenopathy, abnormal bleeding Allergy/immunology: No itchy/watery eyes, significant sneezing, urticaria, angioedema  Physical exam:  Pertinent or positive findings: He appears chronically ill and malnourished. The most striking physical finding is the diffuse  vitiligo especially over the face.  There is normal pigmentation about the eyes.  The vitiligo is irregular and located over the extremities and trunk.  Facies tend to be blank.  He is wearing nasal oxygen.  He is unshaven with a thin mustache and beard.  He is edentulous.  Heart sounds are markedly distant, almost undiscernible.  Chest was surprisingly clear although breath sounds are decreased.  Foley catheter present.  Pedal pulses are not palpated.  Limb atrophy is noted.  He has variable hyperpigmentation and hypopigmentation over the shins.  He cannot raise the right upper extremity above the shoulder.  General appearance: no acute distress, increased work of breathing is present.   Lymphatic: No lymphadenopathy about the head, neck, axilla. Eyes: No conjunctival inflammation or lid edema is present. There is no scleral icterus. Ears:  External ear exam shows no significant lesions or deformities.   Nose:  External nasal examination shows no deformity or inflammation. Nasal mucosa are pink and moist without lesions, exudates Neck:  No thyromegaly, masses, tenderness noted.    Heart:  No gallop, murmur, click, rub .  Lungs:  without wheezes, rhonchi, rales, rubs. Abdomen: Bowel sounds are normal. Abdomen is soft and nontender with no organomegaly, hernias, masses. GU: Deferred  Extremities:  No cyanosis, clubbing, edema  Neurologic exam :Balance, Rhomberg, finger to nose testing could not be completed due to clinical state Skin: Warm & dry w/o tenting.  See summary under each active problem in the Problem List with associated updated therapeutic plan

## 2023-11-23 NOTE — Patient Instructions (Signed)
 See assessment and plan under each diagnosis in the problem list and acutely for this visit

## 2023-11-23 NOTE — Assessment & Plan Note (Signed)
 Serially chronic anemia was relatively stable with current H/H of 8.9/29.5.  This is in the context of CKD stage IV.

## 2023-11-24 ENCOUNTER — Encounter: Payer: Self-pay | Admitting: Adult Health

## 2023-11-25 ENCOUNTER — Other Ambulatory Visit (HOSPITAL_COMMUNITY)
Admission: RE | Admit: 2023-11-25 | Discharge: 2023-11-25 | Disposition: A | Discharge status: Home / Self Care | Payer: Medicare HMO | Source: Skilled Nursing Facility | Attending: Adult Health | Admitting: Adult Health

## 2023-11-25 ENCOUNTER — Ambulatory Visit: Payer: Medicare HMO | Admitting: Urology

## 2023-11-25 DIAGNOSIS — I10 Essential (primary) hypertension: Secondary | ICD-10-CM | POA: Insufficient documentation

## 2023-11-25 DIAGNOSIS — Z978 Presence of other specified devices: Secondary | ICD-10-CM

## 2023-11-25 DIAGNOSIS — F01B18 Vascular dementia, moderate, with other behavioral disturbance: Secondary | ICD-10-CM

## 2023-11-25 DIAGNOSIS — R339 Retention of urine, unspecified: Secondary | ICD-10-CM

## 2023-11-25 DIAGNOSIS — N390 Urinary tract infection, site not specified: Secondary | ICD-10-CM

## 2023-11-25 DIAGNOSIS — C61 Malignant neoplasm of prostate: Secondary | ICD-10-CM

## 2023-11-25 DIAGNOSIS — Z09 Encounter for follow-up examination after completed treatment for conditions other than malignant neoplasm: Secondary | ICD-10-CM

## 2023-11-25 LAB — CBC WITH DIFFERENTIAL/PLATELET
Abs Immature Granulocytes: 0.01 10*3/uL (ref 0.00–0.07)
Basophils Absolute: 0 10*3/uL (ref 0.0–0.1)
Basophils Relative: 0 %
Eosinophils Absolute: 0.1 10*3/uL (ref 0.0–0.5)
Eosinophils Relative: 2 %
HCT: 29.4 % — ABNORMAL LOW (ref 39.0–52.0)
Hemoglobin: 8.8 g/dL — ABNORMAL LOW (ref 13.0–17.0)
Immature Granulocytes: 0 %
Lymphocytes Relative: 37 %
Lymphs Abs: 1.1 10*3/uL (ref 0.7–4.0)
MCH: 28.9 pg (ref 26.0–34.0)
MCHC: 29.9 g/dL — ABNORMAL LOW (ref 30.0–36.0)
MCV: 96.7 fL (ref 80.0–100.0)
Monocytes Absolute: 0.2 10*3/uL (ref 0.1–1.0)
Monocytes Relative: 8 %
Neutro Abs: 1.6 10*3/uL — ABNORMAL LOW (ref 1.7–7.7)
Neutrophils Relative %: 53 %
Platelets: 172 10*3/uL (ref 150–400)
RBC: 3.04 MIL/uL — ABNORMAL LOW (ref 4.22–5.81)
RDW: 19.5 % — ABNORMAL HIGH (ref 11.5–15.5)
WBC: 3 10*3/uL — ABNORMAL LOW (ref 4.0–10.5)
nRBC: 0 % (ref 0.0–0.2)

## 2023-11-25 LAB — COMPREHENSIVE METABOLIC PANEL
ALT: 30 U/L (ref 0–44)
AST: 21 U/L (ref 15–41)
Albumin: 2.9 g/dL — ABNORMAL LOW (ref 3.5–5.0)
Alkaline Phosphatase: 74 U/L (ref 38–126)
Anion gap: 4 — ABNORMAL LOW (ref 5–15)
BUN: 68 mg/dL — ABNORMAL HIGH (ref 8–23)
CO2: 22 mmol/L (ref 22–32)
Calcium: 8.1 mg/dL — ABNORMAL LOW (ref 8.9–10.3)
Chloride: 114 mmol/L — ABNORMAL HIGH (ref 98–111)
Creatinine, Ser: 2.56 mg/dL — ABNORMAL HIGH (ref 0.61–1.24)
GFR, Estimated: 23 mL/min — ABNORMAL LOW (ref >60)
Glucose, Bld: 80 mg/dL (ref 70–99)
Potassium: 4.7 mmol/L (ref 3.5–5.1)
Sodium: 140 mmol/L (ref 135–145)
Total Bilirubin: 0.4 mg/dL (ref 0.0–1.2)
Total Protein: 6.8 g/dL (ref 6.5–8.1)

## 2023-12-01 DIAGNOSIS — C3412 Malignant neoplasm of upper lobe, left bronchus or lung: Secondary | ICD-10-CM | POA: Diagnosis not present

## 2023-12-01 DIAGNOSIS — R262 Difficulty in walking, not elsewhere classified: Secondary | ICD-10-CM | POA: Diagnosis not present

## 2023-12-01 DIAGNOSIS — R0902 Hypoxemia: Secondary | ICD-10-CM | POA: Diagnosis not present

## 2023-12-01 DIAGNOSIS — J9601 Acute respiratory failure with hypoxia: Secondary | ICD-10-CM | POA: Diagnosis not present

## 2023-12-01 DIAGNOSIS — J441 Chronic obstructive pulmonary disease with (acute) exacerbation: Secondary | ICD-10-CM | POA: Diagnosis not present

## 2023-12-01 DIAGNOSIS — J449 Chronic obstructive pulmonary disease, unspecified: Secondary | ICD-10-CM | POA: Diagnosis not present

## 2023-12-01 DIAGNOSIS — M6281 Muscle weakness (generalized): Secondary | ICD-10-CM | POA: Diagnosis not present

## 2023-12-01 DIAGNOSIS — R2689 Other abnormalities of gait and mobility: Secondary | ICD-10-CM | POA: Diagnosis not present

## 2023-12-01 DIAGNOSIS — N184 Chronic kidney disease, stage 4 (severe): Secondary | ICD-10-CM | POA: Diagnosis not present

## 2023-12-01 DIAGNOSIS — I110 Hypertensive heart disease with heart failure: Secondary | ICD-10-CM | POA: Diagnosis not present

## 2023-12-01 DIAGNOSIS — I509 Heart failure, unspecified: Secondary | ICD-10-CM | POA: Diagnosis not present

## 2023-12-01 DIAGNOSIS — R531 Weakness: Secondary | ICD-10-CM | POA: Diagnosis not present

## 2023-12-03 ENCOUNTER — Non-Acute Institutional Stay (SKILLED_NURSING_FACILITY): Payer: Self-pay | Admitting: Adult Health

## 2023-12-03 ENCOUNTER — Encounter: Payer: Self-pay | Admitting: Adult Health

## 2023-12-03 DIAGNOSIS — I700 Atherosclerosis of aorta: Secondary | ICD-10-CM | POA: Diagnosis not present

## 2023-12-03 DIAGNOSIS — N184 Chronic kidney disease, stage 4 (severe): Secondary | ICD-10-CM

## 2023-12-03 DIAGNOSIS — I129 Hypertensive chronic kidney disease with stage 1 through stage 4 chronic kidney disease, or unspecified chronic kidney disease: Secondary | ICD-10-CM

## 2023-12-03 DIAGNOSIS — I5032 Chronic diastolic (congestive) heart failure: Secondary | ICD-10-CM | POA: Diagnosis not present

## 2023-12-03 NOTE — Progress Notes (Signed)
 Location:  [facility] Room Number: 148 Place of Service:  SNF ([id number])   CODE STATUS: dnr   Allergies  Allergen Reactions   Nsaids Other (See Comments)    Decreased renal function    Chief Complaint  Patient presents with   Acute Visit    Care plan meeting    HPI:  We have come together for his care plan meeting. BIMS 10/15 mood 5/30: not sleeping well; decreased energy; not eating well. Is nonambulatory no falls since admission. He requires moderate to dependent assist with his adls. He has a long term foley and is incontinent of bowel. Dietary: regular diet feeds self; weight is stable at 135 pounds. Therapy: ambulate 50 feet with rolling walker and min assist; upper body min assist; lower body mod assist; transfers contact guard to min assist; brp: mod assist. He will continue to be followed for his chronic illnesses including: Chronic diastolic CHF (Congestive heart failure)  Aortic atherosclerosis  Benign hypertension with chronic kidney disease stage IV  Past Medical History:  Diagnosis Date   Arthritis    Hypertension    Hyperthyroidism    lung ca 11/2020    Past Surgical History:  Procedure Laterality Date   COLONOSCOPY N/A 09/02/2016   Procedure: COLONOSCOPY;  Surgeon: [name], MD;  Location: [facility];  Service: Endoscopy;  Laterality: N/A;  830   NO PAST SURGERIES     POLYPECTOMY  09/02/2016   Procedure: POLYPECTOMY;  Surgeon: [name], MD;  Location: [facility];  Service: Endoscopy;;    Social History   Socioeconomic History   Marital status: Divorced    Spouse name: Not on file   Number of children: Not on file   Years of education: Not on file   Highest education level: Not on file  Occupational History   Not on file  Tobacco Use   Smoking status: Former    Current packs/day: 0.00    Average packs/day: 1.5 packs/day for 40.0 years (60.0 ttl pk-yrs)    Types: Cigarettes    Start date: 27    Quit date:  2000    Years since quitting: 25.1   Smokeless tobacco: Never  Vaping Use   Vaping status: Never Used  Substance and Sexual Activity   Alcohol use: No   Drug use: No   Sexual activity: Not on file  Other Topics Concern   Not on file  Social History Narrative   Not on file   Social Drivers of Health   Financial Resource Strain: Not on file  Food Insecurity: No Food Insecurity (11/17/2023)   Hunger Vital Sign    Worried About Running Out of Food in the Last Year: Never true    Ran Out of Food in the Last Year: Never true  Transportation Needs: No Transportation Needs (11/17/2023)   PRAPARE - Administrator, Civil Service (Medical): No    Lack of Transportation (Non-Medical): No  Physical Activity: Not on file  Stress: Not on file  Social Connections: Unknown (11/17/2023)   Social Connection and Isolation Panel [NHANES]    Frequency of Communication with Friends and Family: More than three times a week    Frequency of Social Gatherings with Friends and Family: More than three times a week    Attends Religious Services: Patient declined    Database administrator or Organizations: No    Attends Banker Meetings: Never    Marital  Status: Patient declined  Intimate Partner Violence: Not At Risk (11/17/2023)   Humiliation, Afraid, Rape, and Kick questionnaire    Fear of Current or Ex-Partner: No    Emotionally Abused: No    Physically Abused: No    Sexually Abused: No   Family History  Problem Relation Age of Onset   Colon cancer Other       VITAL SIGNS BP (!) 140/72   Pulse 64   Temp 98.1 F (36.7 C)   Resp 20   Ht 6\' 2"  (1.88 m)   Wt 132 lb (59.9 kg)   SpO2 98%   BMI 16.95 kg/m   Outpatient Encounter Medications as of 12/03/2023  Medication Sig   acetaminophen (TYLENOL) 325 MG tablet Take 2 tablets (650 mg total) by mouth every 6 (six) hours as needed for mild pain (pain score 1-3), fever or headache (or Fever >/= 101).   amLODipine  (NORVASC) 10 MG tablet Take 1 tablet (10 mg total) by mouth daily. Hold for blood pressure reading <130   buPROPion (WELLBUTRIN) 100 MG tablet Take 100 mg by mouth 2 (two) times daily.   Calcium Carbonate Antacid 200 MG TBDP Take 200 mg by mouth daily.   ferrous sulfate 325 (65 FE) MG EC tablet Take 1 tablet (325 mg total) by mouth every Monday, Wednesday, and Friday.   fluticasone-salmeterol (ADVAIR) 250-50 MCG/ACT AEPB Inhale 1 puff into the lungs in the morning and at bedtime.   furosemide (LASIX) 40 MG tablet Take 1 tablet (40 mg total) by mouth every Monday, Wednesday, and Friday.   gabapentin (NEURONTIN) 250 MG/5ML solution Take 3 mLs (150 mg total) by mouth at bedtime.   labetalol (NORMODYNE) 200 MG tablet Take 1 tablet (200 mg total) by mouth 2 (two) times daily. Hold for blood pressure reading <130   levothyroxine (SYNTHROID) 150 MCG tablet Take 1 tablet (150 mcg total) by mouth daily.   Nutritional Supplements (PROSOURCE) LIQD Take 30 mLs by mouth 3 (three) times daily.   polyethylene glycol (MIRALAX / GLYCOLAX) 17 g packet Take 17 g by mouth daily.   QUEtiapine (SEROQUEL) 25 MG tablet Take 0.5 tablets (12.5 mg total) by mouth at bedtime.   senna-docusate (SENOKOT-S) 8.6-50 MG tablet Take 2 tablets by mouth at bedtime.   tamsulosin (FLOMAX) 0.4 MG CAPS capsule Take 1 capsule (0.4 mg total) by mouth daily after supper.   umeclidinium bromide (INCRUSE ELLIPTA) 62.5 MCG/ACT AEPB Inhale 1 puff into the lungs daily.   No facility-administered encounter medications on file as of 12/03/2023.     SIGNIFICANT DIAGNOSTIC EXAMS  PREVIOUS   07-07-23: wbc 5.3; hgb 10.2; hct 32.9; mcv 95.9 plt 167; glucose 98; bun 26; creat 2.04; k+ 3.9; na++ 137; ca 8.0; gfr 31; protein 7.3 albumin 2.7 mag 1.9 07-08-23: tsh 9.279 vitamin B12: 479; folate 7.5; iron 21; tibc 168 07-15-23: wbc 3.9; hgb 8.3; hct 27.1; mcv 98.2 plt 181; glucose 105; bun 39; creat 2.23; k+ 4.3; na++ 137; ca 7.7; gfr 28; mag 1.8   07-20-23: wbc 4.5; hgb 8.1; hct 26.4; mcv 100.4 plt 175; glucose 93; bun 45; creat 2.08; k+ 4.3; na++ 138; ca 7.5; gfr 30 protein 5.5 albumin 2.1; tsh 6.070 07-26-23: wbc 3.6; hgb 8.3; hct 26.7; mcv 99.6 plt 178; glucose 86; bun 50; creat 2.18; k+ 4.8; na++ 139; ca 7.8; gfr 29; protein 6.0 albumin 2.2 iron 33 tibc 201 08-20-23: wbc 5.0; hgb 9.9; hct 31.9; mcv 97.3 plt 210; glucose 94; bun 74; creat 3.13;  k+ 4.6; na++ 132; ca 8.3; gfr 19; protein 7.5 albumin 2.7  08-24-23: glucose 111; bun 68; creat 2.67; k+ 4.1; na++ 136; ca 7.8; gfr 22 10-07-23: wbc 5.3; hgb 10.5; hct 32.5; mcv 90.3 plt 168; glucose 84; bun 66; creat 3.78; k+ 4.2; na++ 134; ca 8.7; gfr 15; protein 7.8; albumin 3.1; urine culture: proteus mirabilis  10-09-23: wbc 4.2; hgb 8.8; hct 26.8; mcv 89.0 plt 165; glucose 100; bun 66; creat 2.72; k+ 3.5; na++ 131; ca 8.1; gfr 22; mag 1.7 10-25-23: wbc 7.2; hgb 9.9; hct 32.1; mcv 93.0 plt 235; glucose 91; bun 101; creat 3.09; k+ 5.2; na++ 140 ca; 8.7; gfr 1.9 protein 7.4 albumin 3.2 ; mag 2.3 urine culture: mixed; blood culture no growth.  11-08-23: hgb 8.6; hct 28.4 glucose 82; bun 60; creat 2.33 k+ 5.3 albumin 137; ca 8.6 gfr 26 protein 6.6 albumin 2.7  11-09-23: k+ 5.2  11-16-23: wbc 4.7; hgb 9,4; hct 30.3; mcv 95.6 plt 203; glucose 101; bun 84; creat 2.44; k+ 5.0; na++ 138; ca 8.5 gfr 25; albumin 3.2 protein 7.4 urine culture: 20,000 colonies 11-18-23: glucose 109; bun 70; creat 2.19; k+ 4.5; na++ 140; ca 7.9; gfr 28; protein 2.5 phos 3.3  NO NEW LABS.    Review of Systems  Constitutional:  Negative for malaise/fatigue.  Respiratory:  Negative for cough and shortness of breath.   Cardiovascular:  Negative for chest pain, palpitations and leg swelling.  Gastrointestinal:  Negative for abdominal pain, constipation and heartburn.  Musculoskeletal:  Negative for back pain, joint pain and myalgias.  Skin: Negative.   Neurological:  Negative for dizziness.  Psychiatric/Behavioral:  The patient is  not nervous/anxious.    Physical Exam Constitutional:      General: He is not in acute distress.    Appearance: He is well-developed. He is not diaphoretic.  Neck:     Thyroid: No thyromegaly.  Cardiovascular:     Rate and Rhythm: Normal rate and regular rhythm.     Pulses: Normal pulses.     Heart sounds: Normal heart sounds.  Pulmonary:     Effort: Pulmonary effort is normal. No respiratory distress.     Breath sounds: Normal breath sounds.  Abdominal:     General: Bowel sounds are normal. There is no distension.     Palpations: Abdomen is soft.     Tenderness: There is no abdominal tenderness.  Genitourinary:    Comments: foley Musculoskeletal:        General: Normal range of motion.     Cervical back: Neck supple.     Right lower leg: Edema present.     Left lower leg: Edema present.  Lymphadenopathy:     Cervical: No cervical adenopathy.  Skin:    General: Skin is warm and dry.  Neurological:     Mental Status: He is alert. Mental status is at baseline.  Psychiatric:        Mood and Affect: Mood normal.      ASSESSMENT/ PLAN:  TODAY  Chronic diastolic CHF (Congestive heart failure) Aortic atherosclerosis Benign hypertension with chronic kidney disease stage IV  Will continue current medications Will continue therapy as directed Will continue to monitor his status   Time spent with patient: 40 minutes: therapy; dietary; plan of care.    Synthia Innocent NP [facility]  call [phone]

## 2023-12-05 ENCOUNTER — Encounter (HOSPITAL_COMMUNITY)
Admission: RE | Admit: 2023-12-05 | Discharge: 2023-12-05 | Disposition: A | Discharge status: Home / Self Care | Source: Skilled Nursing Facility | Attending: Adult Health | Admitting: Adult Health

## 2023-12-05 DIAGNOSIS — Z1383 Encounter for screening for respiratory disorder NEC: Secondary | ICD-10-CM | POA: Insufficient documentation

## 2023-12-05 DIAGNOSIS — J9811 Atelectasis: Secondary | ICD-10-CM | POA: Diagnosis not present

## 2023-12-05 LAB — RESP PANEL BY RT-PCR (RSV, FLU A&B, COVID)  RVPGX2
Influenza A by PCR: POSITIVE — AB
Influenza B by PCR: NEGATIVE
Resp Syncytial Virus by PCR: NEGATIVE
SARS Coronavirus 2 by RT PCR: NEGATIVE

## 2023-12-08 ENCOUNTER — Other Ambulatory Visit: Payer: Self-pay | Admitting: Adult Health

## 2023-12-08 ENCOUNTER — Encounter: Payer: Self-pay | Admitting: Adult Health

## 2023-12-08 ENCOUNTER — Non-Acute Institutional Stay (SKILLED_NURSING_FACILITY): Payer: Self-pay | Admitting: Adult Health

## 2023-12-08 DIAGNOSIS — N184 Chronic kidney disease, stage 4 (severe): Secondary | ICD-10-CM | POA: Diagnosis not present

## 2023-12-08 DIAGNOSIS — C61 Malignant neoplasm of prostate: Secondary | ICD-10-CM

## 2023-12-08 DIAGNOSIS — I700 Atherosclerosis of aorta: Secondary | ICD-10-CM

## 2023-12-08 DIAGNOSIS — R5381 Other malaise: Secondary | ICD-10-CM

## 2023-12-08 DIAGNOSIS — I5032 Chronic diastolic (congestive) heart failure: Secondary | ICD-10-CM

## 2023-12-08 DIAGNOSIS — Z09 Encounter for follow-up examination after completed treatment for conditions other than malignant neoplasm: Secondary | ICD-10-CM

## 2023-12-08 DIAGNOSIS — C3411 Malignant neoplasm of upper lobe, right bronchus or lung: Secondary | ICD-10-CM | POA: Diagnosis not present

## 2023-12-08 DIAGNOSIS — N1330 Unspecified hydronephrosis: Secondary | ICD-10-CM

## 2023-12-08 DIAGNOSIS — I129 Hypertensive chronic kidney disease with stage 1 through stage 4 chronic kidney disease, or unspecified chronic kidney disease: Secondary | ICD-10-CM | POA: Diagnosis not present

## 2023-12-08 DIAGNOSIS — R262 Difficulty in walking, not elsewhere classified: Secondary | ICD-10-CM

## 2023-12-08 DIAGNOSIS — R627 Adult failure to thrive: Secondary | ICD-10-CM

## 2023-12-08 DIAGNOSIS — R339 Retention of urine, unspecified: Secondary | ICD-10-CM

## 2023-12-08 DIAGNOSIS — Z978 Presence of other specified devices: Secondary | ICD-10-CM

## 2023-12-08 MED ORDER — BUPROPION HCL 100 MG PO TABS: 100.0000 mg | ORAL_TABLET | Freq: Two times a day (BID) | ORAL | 0 refills | Status: AC

## 2023-12-08 MED ORDER — TAMSULOSIN HCL 0.4 MG PO CAPS
0.4000 mg | ORAL_CAPSULE | Freq: Every day | ORAL | 0 refills | Status: AC
Start: 2023-12-08 — End: ?

## 2023-12-08 MED ORDER — LEVOTHYROXINE SODIUM 150 MCG PO TABS: 150.0000 ug | ORAL_TABLET | Freq: Every day | ORAL | 0 refills | Status: AC

## 2023-12-08 MED ORDER — FERROUS SULFATE 325 (65 FE) MG PO TBEC: 325.0000 mg | DELAYED_RELEASE_TABLET | ORAL | 0 refills | Status: AC

## 2023-12-08 MED ORDER — INCRUSE ELLIPTA 62.5 MCG/ACT IN AEPB: 1.0000 | INHALATION_SPRAY | Freq: Every day | RESPIRATORY_TRACT | 0 refills | Status: AC

## 2023-12-08 MED ORDER — FUROSEMIDE 40 MG PO TABS: 40.0000 mg | ORAL_TABLET | ORAL | 0 refills | Status: DC

## 2023-12-08 MED ORDER — FLUTICASONE-SALMETEROL 250-50 MCG/ACT IN AEPB: 1.0000 | INHALATION_SPRAY | Freq: Two times a day (BID) | RESPIRATORY_TRACT | 0 refills | Status: DC

## 2023-12-08 MED ORDER — LABETALOL HCL 200 MG PO TABS: 200.0000 mg | ORAL_TABLET | Freq: Two times a day (BID) | ORAL | 0 refills | Status: AC

## 2023-12-08 MED ORDER — GABAPENTIN 250 MG/5ML PO SOLN: 150.0000 mg | Freq: Every day | ORAL | 0 refills | Status: AC

## 2023-12-08 MED ORDER — AMLODIPINE BESYLATE 10 MG PO TABS: 10.0000 mg | ORAL_TABLET | Freq: Every day | ORAL | 0 refills | Status: AC

## 2023-12-08 NOTE — Progress Notes (Signed)
 Location:  [facility] Room Number: 148 Place of Service:  SNF (31)   CODE STATUS: dnr   Allergies  Allergen Reactions   Nsaids Other (See Comments)    Decreased renal function    Chief Complaint  Patient presents with   Discharge Note    HPI:  He is being discharged to home with home health for pt/ot/rn. He will not need any dme; will need to follow up with his medical provider. Will need prescriptions written. He had been hospitalized for: metabolic encephalopathy; acute cystitis with hematuria.  He was admitted to this facility for short term rehab: ambulate 90 feet with rolling wlkaer and min assist; upper body contact guard; lower body mod to max assist brp mod assist. He is ready to return back home.   Past Medical History:  Diagnosis Date   Arthritis    Hypertension    Hyperthyroidism    lung ca 11/2020    Past Surgical History:  Procedure Laterality Date   COLONOSCOPY N/A 09/02/2016   Procedure: COLONOSCOPY;  Surgeon: [name], MD;  [facility];  Service: Endoscopy;  Laterality: N/A;  830   NO PAST SURGERIES     POLYPECTOMY  09/02/2016   Procedure: POLYPECTOMY;  Surgeon: [name], MD;  Location: [facility];  Service: Endoscopy;;    Social History   Socioeconomic History   Marital status: Divorced    Spouse name: Not on file   Number of children: Not on file   Years of education: Not on file   Highest education level: Not on file  Occupational History   Not on file  Tobacco Use   Smoking status: Former    Current packs/day: 0.00    Average packs/day: 1.5 packs/day for 40.0 years (60.0 ttl pk-yrs)    Types: Cigarettes    Start date: 52    Quit date: 2000    Years since quitting: 25.2   Smokeless tobacco: Never  Vaping Use   Vaping status: Never Used  Substance and Sexual Activity   Alcohol use: No   Drug use: No   Sexual activity: Not on file  Other Topics Concern   Not on file  Social  History Narrative   Not on file   Social Drivers of Health   Financial Resource Strain: Not on file  Food Insecurity: No Food Insecurity (11/17/2023)   Hunger Vital Sign    Worried About Running Out of Food in the Last Year: Never true    Ran Out of Food in the Last Year: Never true  Transportation Needs: No Transportation Needs (11/17/2023)   PRAPARE - Administrator, Civil Service (Medical): No    Lack of Transportation (Non-Medical): No  Physical Activity: Not on file  Stress: Not on file  Social Connections: Unknown (11/17/2023)   Social Connection and Isolation Panel [NHANES]    Frequency of Communication with Friends and Family: More than three times a week    Frequency of Social Gatherings with Friends and Family: More than three times a week    Attends Religious Services: Patient declined    Database administrator or Organizations: No    Attends Banker Meetings: Never    Marital Status: Patient declined  Intimate Partner Violence: Not At Risk (11/17/2023)   Humiliation, Afraid, Rape, and Kick questionnaire    Fear of Current or Ex-Partner: No    Emotionally Abused: No    Physically  Abused: No    Sexually Abused: No   Family History  Problem Relation Age of Onset   Colon cancer Other       VITAL SIGNS BP 137/71   Pulse 82   Temp 98.3 F (36.8 C)   Resp 20   Ht 6\' 2"  (1.88 m)   Wt 127 lb 9.6 oz (57.9 kg)   SpO2 93%   BMI 16.38 kg/m   Outpatient Encounter Medications as of 12/08/2023  Medication Sig   acetaminophen (TYLENOL) 325 MG tablet Take 2 tablets (650 mg total) by mouth every 6 (six) hours as needed for mild pain (pain score 1-3), fever or headache (or Fever >/= 101).   amLODipine (NORVASC) 10 MG tablet Take 1 tablet (10 mg total) by mouth daily. Hold for blood pressure reading <130   buPROPion (WELLBUTRIN) 100 MG tablet Take 1 tablet (100 mg total) by mouth 2 (two) times daily.   Calcium Carbonate Antacid 200 MG TBDP Take 200 mg  by mouth daily.   ferrous sulfate 325 (65 FE) MG EC tablet Take 1 tablet (325 mg total) by mouth every Monday, Wednesday, and Friday.   fluticasone-salmeterol (ADVAIR) 250-50 MCG/ACT AEPB Inhale 1 puff into the lungs in the morning and at bedtime.   furosemide (LASIX) 40 MG tablet Take 1 tablet (40 mg total) by mouth every Monday, Wednesday, and Friday.   gabapentin (NEURONTIN) 250 MG/5ML solution Take 3 mLs (150 mg total) by mouth at bedtime.   labetalol (NORMODYNE) 200 MG tablet Take 1 tablet (200 mg total) by mouth 2 (two) times daily. Hold for blood pressure reading <130   levothyroxine (SYNTHROID) 150 MCG tablet Take 1 tablet (150 mcg total) by mouth daily.   Nutritional Supplements (PROSOURCE) LIQD Take 30 mLs by mouth 3 (three) times daily.   polyethylene glycol (MIRALAX / GLYCOLAX) 17 g packet Take 17 g by mouth daily.   senna-docusate (SENOKOT-S) 8.6-50 MG tablet Take 2 tablets by mouth at bedtime.   tamsulosin (FLOMAX) 0.4 MG CAPS capsule Take 1 capsule (0.4 mg total) by mouth daily after supper.   umeclidinium bromide (INCRUSE ELLIPTA) 62.5 MCG/ACT AEPB Inhale 1 puff into the lungs daily.   No facility-administered encounter medications on file as of 12/08/2023.     SIGNIFICANT DIAGNOSTIC EXAMS  PREVIOUS   07-07-23: wbc 5.3; hgb 10.2; hct 32.9; mcv 95.9 plt 167; glucose 98; bun 26; creat 2.04; k+ 3.9; na++ 137; ca 8.0; gfr 31; protein 7.3 albumin 2.7 mag 1.9 07-08-23: tsh 9.279 vitamin B12: 479; folate 7.5; iron 21; tibc 168 07-15-23: wbc 3.9; hgb 8.3; hct 27.1; mcv 98.2 plt 181; glucose 105; bun 39; creat 2.23; k+ 4.3; na++ 137; ca 7.7; gfr 28; mag 1.8  07-20-23: wbc 4.5; hgb 8.1; hct 26.4; mcv 100.4 plt 175; glucose 93; bun 45; creat 2.08; k+ 4.3; na++ 138; ca 7.5; gfr 30 protein 5.5 albumin 2.1; tsh 6.070 07-26-23: wbc 3.6; hgb 8.3; hct 26.7; mcv 99.6 plt 178; glucose 86; bun 50; creat 2.18; k+ 4.8; na++ 139; ca 7.8; gfr 29; protein 6.0 albumin 2.2 iron 33 tibc 201 08-20-23: wbc  5.0; hgb 9.9; hct 31.9; mcv 97.3 plt 210; glucose 94; bun 74; creat 3.13; k+ 4.6; na++ 132; ca 8.3; gfr 19; protein 7.5 albumin 2.7  08-24-23: glucose 111; bun 68; creat 2.67; k+ 4.1; na++ 136; ca 7.8; gfr 22 10-07-23: wbc 5.3; hgb 10.5; hct 32.5; mcv 90.3 plt 168; glucose 84; bun 66; creat 3.78; k+ 4.2; na++ 134; ca 8.7;  gfr 15; protein 7.8; albumin 3.1; urine culture: proteus mirabilis  10-09-23: wbc 4.2; hgb 8.8; hct 26.8; mcv 89.0 plt 165; glucose 100; bun 66; creat 2.72; k+ 3.5; na++ 131; ca 8.1; gfr 22; mag 1.7 10-25-23: wbc 7.2; hgb 9.9; hct 32.1; mcv 93.0 plt 235; glucose 91; bun 101; creat 3.09; k+ 5.2; na++ 140 ca; 8.7; gfr 1.9 protein 7.4 albumin 3.2 ; mag 2.3 urine culture: mixed; blood culture no growth.  11-08-23: hgb 8.6; hct 28.4 glucose 82; bun 60; creat 2.33 k+ 5.3 albumin 137; ca 8.6 gfr 26 protein 6.6 albumin 2.7  11-09-23: k+ 5.2  11-16-23: wbc 4.7; hgb 9,4; hct 30.3; mcv 95.6 plt 203; glucose 101; bun 84; creat 2.44; k+ 5.0; na++ 138; ca 8.5 gfr 25; albumin 3.2 protein 7.4 urine culture: 20,000 colonies 11-18-23: glucose 109; bun 70; creat 2.19; k+ 4.5; na++ 140; ca 7.9; gfr 28; protein 2.5 phos 3.3  NO NEW LABS.    Review of Systems  Constitutional:  Negative for malaise/fatigue.  Respiratory:  Negative for cough and shortness of breath.   Cardiovascular:  Negative for chest pain, palpitations and leg swelling.  Gastrointestinal:  Negative for abdominal pain, constipation and heartburn.  Musculoskeletal:  Negative for back pain, joint pain and myalgias.  Skin: Negative.   Neurological:  Negative for dizziness.  Psychiatric/Behavioral:  The patient is not nervous/anxious.    Physical Exam Constitutional:      General: He is not in acute distress.    Appearance: He is well-developed. He is not diaphoretic.  Neck:     Thyroid: No thyromegaly.  Cardiovascular:     Rate and Rhythm: Normal rate and regular rhythm.     Heart sounds: Normal heart sounds.  Pulmonary:     Effort:  Pulmonary effort is normal. No respiratory distress.     Breath sounds: Normal breath sounds.  Abdominal:     General: Bowel sounds are normal. There is no distension.     Palpations: Abdomen is soft.     Tenderness: There is no abdominal tenderness.  Genitourinary:    Comments: foley Musculoskeletal:        General: Normal range of motion.     Cervical back: Neck supple.     Right lower leg: No edema.     Left lower leg: No edema.  Lymphadenopathy:     Cervical: No cervical adenopathy.  Skin:    General: Skin is warm and dry.  Neurological:     Mental Status: He is alert. Mental status is at baseline.  Psychiatric:        Mood and Affect: Mood normal.    ASSESSMENT/ PLAN:   Patient is being discharged with the following home health services:  pt/ot/rn: to evaluate and treat as indicated for gait balance strength adl training. Medication management   Patient is being discharged with the following durable medical equipment:  none needed   Patient has been advised to f/u with their PCP in 1-2 weeks to for a transitions of care visit.  Social services at their facility was responsible for arranging this appointment.  Pt was provided with adequate prescriptions of noncontrolled medications to reach the scheduled appointment .  For controlled substances, a limited supply was provided as appropriate for the individual patient.  If the pt normally receives these medications from a pain clinic or has a contract with another physician, these medications should be received from that clinic or physician only).   Prescriptions have been sent to [facility]    Time spent with patient: 35 minutes: medications;dme; home health   Synthia Innocent NP [facility]   call [phone]

## 2023-12-13 DIAGNOSIS — J9611 Chronic respiratory failure with hypoxia: Secondary | ICD-10-CM | POA: Diagnosis not present

## 2023-12-13 DIAGNOSIS — Z9981 Dependence on supplemental oxygen: Secondary | ICD-10-CM | POA: Diagnosis not present

## 2023-12-13 DIAGNOSIS — I700 Atherosclerosis of aorta: Secondary | ICD-10-CM | POA: Diagnosis not present

## 2023-12-13 DIAGNOSIS — E46 Unspecified protein-calorie malnutrition: Secondary | ICD-10-CM | POA: Diagnosis not present

## 2023-12-13 DIAGNOSIS — E039 Hypothyroidism, unspecified: Secondary | ICD-10-CM | POA: Diagnosis not present

## 2023-12-13 DIAGNOSIS — I5032 Chronic diastolic (congestive) heart failure: Secondary | ICD-10-CM | POA: Diagnosis not present

## 2023-12-13 DIAGNOSIS — D509 Iron deficiency anemia, unspecified: Secondary | ICD-10-CM | POA: Diagnosis not present

## 2023-12-13 DIAGNOSIS — Z87891 Personal history of nicotine dependence: Secondary | ICD-10-CM | POA: Diagnosis not present

## 2023-12-13 DIAGNOSIS — Z466 Encounter for fitting and adjustment of urinary device: Secondary | ICD-10-CM | POA: Diagnosis not present

## 2023-12-13 DIAGNOSIS — I130 Hypertensive heart and chronic kidney disease with heart failure and stage 1 through stage 4 chronic kidney disease, or unspecified chronic kidney disease: Secondary | ICD-10-CM | POA: Diagnosis not present

## 2023-12-13 DIAGNOSIS — N184 Chronic kidney disease, stage 4 (severe): Secondary | ICD-10-CM | POA: Diagnosis not present

## 2023-12-13 DIAGNOSIS — J439 Emphysema, unspecified: Secondary | ICD-10-CM | POA: Diagnosis not present

## 2023-12-16 DIAGNOSIS — J439 Emphysema, unspecified: Secondary | ICD-10-CM | POA: Diagnosis not present

## 2023-12-16 DIAGNOSIS — D509 Iron deficiency anemia, unspecified: Secondary | ICD-10-CM | POA: Diagnosis not present

## 2023-12-16 DIAGNOSIS — I700 Atherosclerosis of aorta: Secondary | ICD-10-CM | POA: Diagnosis not present

## 2023-12-16 DIAGNOSIS — I130 Hypertensive heart and chronic kidney disease with heart failure and stage 1 through stage 4 chronic kidney disease, or unspecified chronic kidney disease: Secondary | ICD-10-CM | POA: Diagnosis not present

## 2023-12-16 DIAGNOSIS — Z87891 Personal history of nicotine dependence: Secondary | ICD-10-CM | POA: Diagnosis not present

## 2023-12-16 DIAGNOSIS — Z9981 Dependence on supplemental oxygen: Secondary | ICD-10-CM | POA: Diagnosis not present

## 2023-12-16 DIAGNOSIS — E039 Hypothyroidism, unspecified: Secondary | ICD-10-CM | POA: Diagnosis not present

## 2023-12-16 DIAGNOSIS — N184 Chronic kidney disease, stage 4 (severe): Secondary | ICD-10-CM | POA: Diagnosis not present

## 2023-12-16 DIAGNOSIS — E46 Unspecified protein-calorie malnutrition: Secondary | ICD-10-CM | POA: Diagnosis not present

## 2023-12-16 DIAGNOSIS — I5032 Chronic diastolic (congestive) heart failure: Secondary | ICD-10-CM | POA: Diagnosis not present

## 2023-12-16 DIAGNOSIS — J9611 Chronic respiratory failure with hypoxia: Secondary | ICD-10-CM | POA: Diagnosis not present

## 2023-12-16 DIAGNOSIS — Z466 Encounter for fitting and adjustment of urinary device: Secondary | ICD-10-CM | POA: Diagnosis not present

## 2023-12-17 DIAGNOSIS — Z87891 Personal history of nicotine dependence: Secondary | ICD-10-CM | POA: Diagnosis not present

## 2023-12-17 DIAGNOSIS — I5032 Chronic diastolic (congestive) heart failure: Secondary | ICD-10-CM | POA: Diagnosis not present

## 2023-12-17 DIAGNOSIS — Z466 Encounter for fitting and adjustment of urinary device: Secondary | ICD-10-CM | POA: Diagnosis not present

## 2023-12-17 DIAGNOSIS — E46 Unspecified protein-calorie malnutrition: Secondary | ICD-10-CM | POA: Diagnosis not present

## 2023-12-17 DIAGNOSIS — J439 Emphysema, unspecified: Secondary | ICD-10-CM | POA: Diagnosis not present

## 2023-12-17 DIAGNOSIS — Z9981 Dependence on supplemental oxygen: Secondary | ICD-10-CM | POA: Diagnosis not present

## 2023-12-17 DIAGNOSIS — I700 Atherosclerosis of aorta: Secondary | ICD-10-CM | POA: Diagnosis not present

## 2023-12-17 DIAGNOSIS — E039 Hypothyroidism, unspecified: Secondary | ICD-10-CM | POA: Diagnosis not present

## 2023-12-17 DIAGNOSIS — I130 Hypertensive heart and chronic kidney disease with heart failure and stage 1 through stage 4 chronic kidney disease, or unspecified chronic kidney disease: Secondary | ICD-10-CM | POA: Diagnosis not present

## 2023-12-17 DIAGNOSIS — N184 Chronic kidney disease, stage 4 (severe): Secondary | ICD-10-CM | POA: Diagnosis not present

## 2023-12-17 DIAGNOSIS — D509 Iron deficiency anemia, unspecified: Secondary | ICD-10-CM | POA: Diagnosis not present

## 2023-12-17 DIAGNOSIS — J9611 Chronic respiratory failure with hypoxia: Secondary | ICD-10-CM | POA: Diagnosis not present

## 2023-12-20 DIAGNOSIS — Z758 Other problems related to medical facilities and other health care: Secondary | ICD-10-CM | POA: Diagnosis not present

## 2023-12-20 DIAGNOSIS — I129 Hypertensive chronic kidney disease with stage 1 through stage 4 chronic kidney disease, or unspecified chronic kidney disease: Secondary | ICD-10-CM | POA: Diagnosis not present

## 2023-12-20 DIAGNOSIS — Z85118 Personal history of other malignant neoplasm of bronchus and lung: Secondary | ICD-10-CM | POA: Diagnosis not present

## 2023-12-20 DIAGNOSIS — N1832 Chronic kidney disease, stage 3b: Secondary | ICD-10-CM | POA: Diagnosis not present

## 2023-12-22 ENCOUNTER — Telehealth: Payer: Self-pay

## 2023-12-22 ENCOUNTER — Ambulatory Visit (INDEPENDENT_AMBULATORY_CARE_PROVIDER_SITE_OTHER): Payer: Medicare HMO | Admitting: Urology

## 2023-12-22 ENCOUNTER — Encounter: Payer: Self-pay | Admitting: Urology

## 2023-12-22 VITALS — BP 100/62 | HR 56

## 2023-12-22 DIAGNOSIS — Z9981 Dependence on supplemental oxygen: Secondary | ICD-10-CM | POA: Diagnosis not present

## 2023-12-22 DIAGNOSIS — R339 Retention of urine, unspecified: Secondary | ICD-10-CM | POA: Diagnosis not present

## 2023-12-22 DIAGNOSIS — Z87891 Personal history of nicotine dependence: Secondary | ICD-10-CM | POA: Diagnosis not present

## 2023-12-22 DIAGNOSIS — I700 Atherosclerosis of aorta: Secondary | ICD-10-CM | POA: Diagnosis not present

## 2023-12-22 DIAGNOSIS — D509 Iron deficiency anemia, unspecified: Secondary | ICD-10-CM | POA: Diagnosis not present

## 2023-12-22 DIAGNOSIS — J439 Emphysema, unspecified: Secondary | ICD-10-CM | POA: Diagnosis not present

## 2023-12-22 DIAGNOSIS — I130 Hypertensive heart and chronic kidney disease with heart failure and stage 1 through stage 4 chronic kidney disease, or unspecified chronic kidney disease: Secondary | ICD-10-CM | POA: Diagnosis not present

## 2023-12-22 DIAGNOSIS — E46 Unspecified protein-calorie malnutrition: Secondary | ICD-10-CM | POA: Diagnosis not present

## 2023-12-22 DIAGNOSIS — I5032 Chronic diastolic (congestive) heart failure: Secondary | ICD-10-CM | POA: Diagnosis not present

## 2023-12-22 DIAGNOSIS — J9611 Chronic respiratory failure with hypoxia: Secondary | ICD-10-CM | POA: Diagnosis not present

## 2023-12-22 DIAGNOSIS — R399 Unspecified symptoms and signs involving the genitourinary system: Secondary | ICD-10-CM

## 2023-12-22 DIAGNOSIS — Z978 Presence of other specified devices: Secondary | ICD-10-CM | POA: Insufficient documentation

## 2023-12-22 DIAGNOSIS — Z8546 Personal history of malignant neoplasm of prostate: Secondary | ICD-10-CM

## 2023-12-22 DIAGNOSIS — Z466 Encounter for fitting and adjustment of urinary device: Secondary | ICD-10-CM | POA: Diagnosis not present

## 2023-12-22 DIAGNOSIS — C61 Malignant neoplasm of prostate: Secondary | ICD-10-CM

## 2023-12-22 DIAGNOSIS — N184 Chronic kidney disease, stage 4 (severe): Secondary | ICD-10-CM | POA: Diagnosis not present

## 2023-12-22 DIAGNOSIS — E039 Hypothyroidism, unspecified: Secondary | ICD-10-CM | POA: Diagnosis not present

## 2023-12-22 MED ORDER — CEPHALEXIN 500 MG PO CAPS: 500.0000 mg | ORAL_CAPSULE | Freq: Two times a day (BID) | ORAL | 0 refills | Status: AC

## 2023-12-22 NOTE — Telephone Encounter (Signed)
 Called Pt daughter to verify home health has cath supplies and will continue cath changes Pt daughter confirmed

## 2023-12-22 NOTE — Telephone Encounter (Signed)
 Adding to last telephone encounter Joni Reining states home health has cath supplies and will take over cath changes after the one completed by [facility] on 03/26

## 2023-12-22 NOTE — Progress Notes (Signed)
 Cath Change/ Replacement  Patient is present today for a catheter change due to urinary retention.  8ml of water was removed from the balloon, a 16FR coude foley cath was removed without difficulty.  Patient was cleaned and prepped in a sterile fashion with Betadinex3.  A 16 coude FR foley cath was replaced into the bladder, no complications were noted. Urine return was noted 0ml catheter was flushed with 70mL of sterile water. The balloon was filled with 10ml of sterile water. A bed bag was attached for drainage. Patient was given proper instruction on catheter care.    Performed by: Alfonse Spruce. CMA  Follow up:  w/ NP in office today

## 2023-12-22 NOTE — Progress Notes (Signed)
 Name: [name] DOB: 04-Jul-1933 MRN: [id number]  History of Present Illness: [name] is a 89 y.o. male who presents today for follow up visit at [facility] [place]. He is accompanied by Dorris Singh, who assists with providing history due to patient's dementia. - GU history: 1. Prostate cancer. - 07/24/2015: Prostate biopsy by Dr. [name] (pathology positive for prostate adenocarcinoma). - 11/27/2015: Underwent gold seed placement at [facility] prior to IMRT in [place].  - No PSA values within past 10 years found per chart review. 2. Chronic urinary retention. 3. Bilateral renal cysts. 4. CKD stage 4. Followed by Nephrology [facility]).  5. Erectile dysfunction.   Recent history: > 10/25/2023 - 10/29/2023:  - Admitted for pneumonia and uremia from AKI.  - Creatinine elevated to 3.09 upon admission. His baseline creatinine is 2.5-2.7. - Initially suspected to have UTI based on urinalysis with moderate leukocytes, however urine culture came back with multiple species present.  - 10/25/2023: CT abdomen/pelvis notable for moderate right hydroureter with increased left hydroureteronephrosis and diffuse bladder wall thickening.   - 10/27/2023: Bladder scan showed urinary retention (PVR = 517 mL).  - 10/28/2023: Foley catheter placed by urology (Dr. Annabell Howells). - Creatinine improved to 2.66 at time of discharge.   - Advised to continue Flomax (Tamsulosin) 0.4 mg daily.  > 11/11/2023: Urology visit. Passed voiding trial.  The plan was: 1. Foley catheter removed.  2. Staff at [facility] instructed to assess his post-void residual (PVR) later today around dinner time either via bladder scan or I&O cath. If PVR is >200 ml: Replace indwelling Foley catheter and notify [facility].  3. Increase Flomax (Tamsulosin) 0.4 mg to BID (2x/day). 4. Staff at [facility] are instructed to encourage hydration with water at least once every  2-4 hours while patient is awake.  5. Return in about 2 weeks (around 11/25/2023) for UA, PVR, & f/u with Evette Georges NP with [facility] prior.  Since last visit: > 11/15/2023: Sent from SNF to ER for urinary retention for catheter placement.   > 11/16/2023 - 11/19/2023: - Admitted for altered mental status / confusion / weakness / multiple falls. Diagnosed with metabolic encephalopathy secondary to Enterococcus faecalis CAUTI. Treated with Amoxicillin. - His Foley catheter replaced in the ER.   Today: [name] reports that [name] is a bit more confused than usual today.  They report that the catheter has been draining well; due for exchange (last exchanged 11/16/2023). He denies gross hematuria, flank pain, abdominal pain, fevers, nausea, vomiting, increased weakness / fatigue.  Medications: Current Outpatient Medications  Medication Sig Dispense Refill   acetaminophen (TYLENOL) 325 MG tablet Take 2 tablets (650 mg total) by mouth every 6 (six) hours as needed for mild pain (pain score 1-3), fever or headache (or Fever >/= 101).     amLODipine (NORVASC) 10 MG tablet Take 1 tablet (10 mg total) by mouth daily. Hold for blood pressure reading <130 30 tablet 0   buPROPion (WELLBUTRIN) 100 MG tablet Take 1 tablet (100 mg total) by mouth 2 (two) times daily. 60 tablet 0   Calcium Carbonate Antacid 200 MG TBDP Take 200 mg by mouth daily.     cephALEXin (KEFLEX) 500 MG capsule Take 1 capsule (500 mg total) by mouth 2 (two) times daily for 7 days. 14 capsule 0   ferrous sulfate 325 (65 FE) MG EC tablet Take 1 tablet (325 mg total) by mouth every Monday, Wednesday, and Friday.  12 tablet 0   fluticasone-salmeterol (ADVAIR) 250-50 MCG/ACT AEPB Inhale 1 puff into the lungs in the morning and at bedtime. 60 each 0   furosemide (LASIX) 40 MG tablet Take 1 tablet (40 mg total) by mouth every Monday, Wednesday, and Friday. 12 tablet 0   gabapentin (NEURONTIN) 250 MG/5ML solution Take 3 mLs (150 mg total) by mouth  at bedtime. 90 mL 0   labetalol (NORMODYNE) 200 MG tablet Take 1 tablet (200 mg total) by mouth 2 (two) times daily. Hold for blood pressure reading <130 60 tablet 0   levothyroxine (SYNTHROID) 150 MCG tablet Take 1 tablet (150 mcg total) by mouth daily. 30 tablet 0   Nutritional Supplements (PROSOURCE) LIQD Take 30 mLs by mouth 3 (three) times daily.     polyethylene glycol (MIRALAX / GLYCOLAX) 17 g packet Take 17 g by mouth daily.     senna-docusate (SENOKOT-S) 8.6-50 MG tablet Take 2 tablets by mouth at bedtime. 60 tablet 4   tamsulosin (FLOMAX) 0.4 MG CAPS capsule Take 1 capsule (0.4 mg total) by mouth daily after supper. 30 capsule 0   umeclidinium bromide (INCRUSE ELLIPTA) 62.5 MCG/ACT AEPB Inhale 1 puff into the lungs daily. 30 each 0   No current facility-administered medications for this visit.    Allergies: Allergies  Allergen Reactions   Nsaids Other (See Comments)    Decreased renal function    Past Medical History:  Diagnosis Date   Arthritis    Hypertension    Hyperthyroidism    lung ca 11/2020   Past Surgical History:  Procedure Laterality Date   COLONOSCOPY N/A 09/02/2016   Procedure: COLONOSCOPY;  Surgeon: [name], MD;  [facility];  Service: Endoscopy;  Laterality: N/A;  830   NO PAST SURGERIES     POLYPECTOMY  09/02/2016   Procedure: POLYPECTOMY;  Surgeon: [name], MD;  [facility];  Service: Endoscopy;;   Family History  Problem Relation Age of Onset   Colon cancer Other    Social History   Socioeconomic History   Marital status: Divorced    Spouse name: Not on file   Number of children: Not on file   Years of education: Not on file   Highest education level: Not on file  Occupational History   Not on file  Tobacco Use   Smoking status: Former    Current packs/day: 0.00    Average packs/day: 1.5 packs/day for 40.0 years (60.0 ttl pk-yrs)    Types: Cigarettes    Start date: 83    Quit date: 2000    Years  since quitting: 25.2   Smokeless tobacco: Never  Vaping Use   Vaping status: Never Used  Substance and Sexual Activity   Alcohol use: No   Drug use: No   Sexual activity: Not on file  Other Topics Concern   Not on file  Social History Narrative   Not on file   Social Drivers of Health   Financial Resource Strain: Not on file  Food Insecurity: No Food Insecurity (11/17/2023)   Hunger Vital Sign    Worried About Running Out of Food in the Last Year: Never true    Ran Out of Food in the Last Year: Never true  Transportation Needs: No Transportation Needs (11/17/2023)   PRAPARE - Administrator, Civil Service (Medical): No    Lack of Transportation (Non-Medical): No  Physical Activity: Not on file  Stress: Not on file  Social Connections: Unknown (11/17/2023)   Social Connection and Isolation Panel [NHANES]    Frequency of Communication with Friends and Family: More than three times a week    Frequency of Social Gatherings with Friends and Family: More than three times a week    Attends Religious Services: Patient declined    Database administrator or Organizations: No    Attends Banker Meetings: Never    Marital Status: Patient declined  Catering manager Violence: Not At Risk (11/17/2023)   Humiliation, Afraid, Rape, and Kick questionnaire    Fear of Current or Ex-Partner: No    Emotionally Abused: No    Physically Abused: No    Sexually Abused: No    Review of Systems Constitutional: Patient denies any unintentional weight loss or change in strength lntegumentary: Patient denies any rashes or pruritus Cardiovascular: Patient denies chest pain or syncope Respiratory: Patient denies shortness of breath Gastrointestinal: Patient denies nausea, vomiting, constipation, or diarrhea Musculoskeletal: Patient denies muscle cramps or weakness Neurologic: Patient denies convulsions or seizures Allergic/Immunologic: Patient denies recent allergic  reaction(s) Hematologic/Lymphatic: Patient denies bleeding tendencies Endocrine: Patient denies heat/cold intolerance  GU: As per HPI.  OBJECTIVE Vitals:   12/22/23 1414  BP: 100/62  Pulse: (!) 56   There is no height or weight on file to calculate BMI.  Physical Examination Constitutional: No obvious distress; patient is non-toxic appearing  Cardiovascular: No visible lower extremity edema.  Respiratory: The patient does not have audible wheezing/stridor; respirations do not appear labored  Gastrointestinal: Abdomen non-distended Musculoskeletal: Normal ROM of UEs  Skin: No obvious rashes/open sores  Neurologic: CN 2-12 grossly intact Psychiatric: Answered questions appropriately with normal affect  Hematologic/Lymphatic/Immunologic: No obvious bruises or sites of spontaneous bleeding   ASSESSMENT UTI symptoms - Plan: cephALEXin (KEFLEX) 500 MG capsule, INSERT,TEMP INDWELLING BLAD CATH,SIMPLE, Urine Culture  Urinary retention  Carcinoma of prostate (HCC)  Foley catheter in place  Although bacteriuria is anticipated due to indwelling Foley catheter, will treat empirically with Keflex (Cephalexin) 500 mg 2x/day for 7 days for suspected CAUTI based on current symptoms (increased confusion) while awaiting urine culture / sensitivity report.  Catheter exchanged today by nursing staff. We discussed recommendation for long term indwelling Foley catheter use due to prior failed voiding trials / chronic urinary retention. He is not a candidate for CIC. Will plan for monthly catheter exchange either in office or via home health.  Patient / [name] verbalized understanding of and agreement with current plan. All questions were answered.  PLAN Advised the following: 1. Urine culture. 2. Keflex (Cephalexin) 500 mg 2x/day for 7 days. 3. Continue indwelling Foley catheter; exchange at least once every 4 weeks and PRN. 4. Return in about 4 weeks (around 01/19/2024) for nurse visit for  Foley catheter exchange.  Orders Placed This Encounter  Procedures   Urine Culture   INSERT,TEMP INDWELLING BLAD CATH,SIMPLE    It has been explained that the patient is to follow regularly with their PCP in addition to all other providers involved in their care and to follow instructions provided by these respective offices. Patient advised to contact urology clinic if any urologic-pertaining questions, concerns, new symptoms or problems arise in the interim period.  There are no Patient Instructions on file for this visit.  Electronically signed by:  Donnita Falls, FNP   12/22/23    2:48 PM

## 2023-12-22 NOTE — Telephone Encounter (Signed)
 Home health: [name]) states Pt daughter says he has increased confusion (Pt said "he wasn't at home" when he really was) potential UTI catheter last changed 02/18 when Pt was in the nursing home wants [place] to change catheter and do a possible UA for the confusion

## 2023-12-22 NOTE — Telephone Encounter (Signed)
-----   Message from Donnita Falls sent at 12/22/2023  2:47 PM EDT ----- Please contact pt's daughter Beacher May to find out if home health order is needed for catheter exchanges monthly. For now we're schedule nurse visit here in 4 weeks for that.

## 2023-12-22 NOTE — Addendum Note (Signed)
 Addended by: Sarajane Jews on: 12/22/2023 03:48 PM   Modules accepted: Orders

## 2023-12-23 DIAGNOSIS — N184 Chronic kidney disease, stage 4 (severe): Secondary | ICD-10-CM | POA: Diagnosis not present

## 2023-12-23 DIAGNOSIS — Z9981 Dependence on supplemental oxygen: Secondary | ICD-10-CM | POA: Diagnosis not present

## 2023-12-23 DIAGNOSIS — D509 Iron deficiency anemia, unspecified: Secondary | ICD-10-CM | POA: Diagnosis not present

## 2023-12-23 DIAGNOSIS — Z87891 Personal history of nicotine dependence: Secondary | ICD-10-CM | POA: Diagnosis not present

## 2023-12-23 DIAGNOSIS — E46 Unspecified protein-calorie malnutrition: Secondary | ICD-10-CM | POA: Diagnosis not present

## 2023-12-23 DIAGNOSIS — I700 Atherosclerosis of aorta: Secondary | ICD-10-CM | POA: Diagnosis not present

## 2023-12-23 DIAGNOSIS — I130 Hypertensive heart and chronic kidney disease with heart failure and stage 1 through stage 4 chronic kidney disease, or unspecified chronic kidney disease: Secondary | ICD-10-CM | POA: Diagnosis not present

## 2023-12-23 DIAGNOSIS — E039 Hypothyroidism, unspecified: Secondary | ICD-10-CM | POA: Diagnosis not present

## 2023-12-23 DIAGNOSIS — J9611 Chronic respiratory failure with hypoxia: Secondary | ICD-10-CM | POA: Diagnosis not present

## 2023-12-23 DIAGNOSIS — I5032 Chronic diastolic (congestive) heart failure: Secondary | ICD-10-CM | POA: Diagnosis not present

## 2023-12-23 DIAGNOSIS — Z466 Encounter for fitting and adjustment of urinary device: Secondary | ICD-10-CM | POA: Diagnosis not present

## 2023-12-23 DIAGNOSIS — J439 Emphysema, unspecified: Secondary | ICD-10-CM | POA: Diagnosis not present

## 2023-12-24 DIAGNOSIS — Z466 Encounter for fitting and adjustment of urinary device: Secondary | ICD-10-CM | POA: Diagnosis not present

## 2023-12-24 DIAGNOSIS — Z87891 Personal history of nicotine dependence: Secondary | ICD-10-CM | POA: Diagnosis not present

## 2023-12-24 DIAGNOSIS — J439 Emphysema, unspecified: Secondary | ICD-10-CM | POA: Diagnosis not present

## 2023-12-24 DIAGNOSIS — I700 Atherosclerosis of aorta: Secondary | ICD-10-CM | POA: Diagnosis not present

## 2023-12-24 DIAGNOSIS — N184 Chronic kidney disease, stage 4 (severe): Secondary | ICD-10-CM | POA: Diagnosis not present

## 2023-12-24 DIAGNOSIS — E039 Hypothyroidism, unspecified: Secondary | ICD-10-CM | POA: Diagnosis not present

## 2023-12-24 DIAGNOSIS — I5032 Chronic diastolic (congestive) heart failure: Secondary | ICD-10-CM | POA: Diagnosis not present

## 2023-12-24 DIAGNOSIS — E46 Unspecified protein-calorie malnutrition: Secondary | ICD-10-CM | POA: Diagnosis not present

## 2023-12-24 DIAGNOSIS — D509 Iron deficiency anemia, unspecified: Secondary | ICD-10-CM | POA: Diagnosis not present

## 2023-12-24 DIAGNOSIS — J9611 Chronic respiratory failure with hypoxia: Secondary | ICD-10-CM | POA: Diagnosis not present

## 2023-12-24 DIAGNOSIS — Z9981 Dependence on supplemental oxygen: Secondary | ICD-10-CM | POA: Diagnosis not present

## 2023-12-24 DIAGNOSIS — I130 Hypertensive heart and chronic kidney disease with heart failure and stage 1 through stage 4 chronic kidney disease, or unspecified chronic kidney disease: Secondary | ICD-10-CM | POA: Diagnosis not present

## 2023-12-26 ENCOUNTER — Emergency Department (HOSPITAL_COMMUNITY)
Admission: EM | Admit: 2023-12-26 | Discharge: 2023-12-26 | Disposition: A | Discharge status: Home / Self Care | Attending: Emergency Medicine | Admitting: Emergency Medicine

## 2023-12-26 ENCOUNTER — Other Ambulatory Visit: Payer: Self-pay

## 2023-12-26 ENCOUNTER — Emergency Department (HOSPITAL_COMMUNITY)

## 2023-12-26 ENCOUNTER — Encounter (HOSPITAL_COMMUNITY): Payer: Self-pay | Admitting: *Deleted

## 2023-12-26 DIAGNOSIS — R413 Other amnesia: Secondary | ICD-10-CM | POA: Diagnosis not present

## 2023-12-26 DIAGNOSIS — R4182 Altered mental status, unspecified: Secondary | ICD-10-CM | POA: Diagnosis not present

## 2023-12-26 DIAGNOSIS — I10 Essential (primary) hypertension: Secondary | ICD-10-CM | POA: Diagnosis not present

## 2023-12-26 DIAGNOSIS — Z79899 Other long term (current) drug therapy: Secondary | ICD-10-CM | POA: Insufficient documentation

## 2023-12-26 DIAGNOSIS — R531 Weakness: Secondary | ICD-10-CM | POA: Diagnosis not present

## 2023-12-26 DIAGNOSIS — N3001 Acute cystitis with hematuria: Secondary | ICD-10-CM | POA: Diagnosis not present

## 2023-12-26 DIAGNOSIS — F0390 Unspecified dementia without behavioral disturbance: Secondary | ICD-10-CM | POA: Diagnosis not present

## 2023-12-26 HISTORY — DX: Retention of urine, unspecified: R33.9

## 2023-12-26 LAB — CBC WITH DIFFERENTIAL/PLATELET
Abs Immature Granulocytes: 0 10*3/uL (ref 0.00–0.07)
Basophils Absolute: 0 10*3/uL (ref 0.0–0.1)
Basophils Relative: 1 %
Eosinophils Absolute: 0 10*3/uL (ref 0.0–0.5)
Eosinophils Relative: 1 %
HCT: 33.9 % — ABNORMAL LOW (ref 39.0–52.0)
Hemoglobin: 10.6 g/dL — ABNORMAL LOW (ref 13.0–17.0)
Immature Granulocytes: 0 %
Lymphocytes Relative: 34 %
Lymphs Abs: 0.9 10*3/uL (ref 0.7–4.0)
MCH: 29.2 pg (ref 26.0–34.0)
MCHC: 31.3 g/dL (ref 30.0–36.0)
MCV: 93.4 fL (ref 80.0–100.0)
Monocytes Absolute: 0.2 10*3/uL (ref 0.1–1.0)
Monocytes Relative: 6 %
Neutro Abs: 1.5 10*3/uL — ABNORMAL LOW (ref 1.7–7.7)
Neutrophils Relative %: 58 %
Platelets: 149 10*3/uL — ABNORMAL LOW (ref 150–400)
RBC: 3.63 MIL/uL — ABNORMAL LOW (ref 4.22–5.81)
RDW: 16.1 % — ABNORMAL HIGH (ref 11.5–15.5)
WBC: 2.5 10*3/uL — ABNORMAL LOW (ref 4.0–10.5)
nRBC: 0 % (ref 0.0–0.2)

## 2023-12-26 LAB — COMPREHENSIVE METABOLIC PANEL WITH GFR
ALT: 48 U/L — ABNORMAL HIGH (ref 0–44)
AST: 30 U/L (ref 15–41)
Albumin: 3.1 g/dL — ABNORMAL LOW (ref 3.5–5.0)
Alkaline Phosphatase: 85 U/L (ref 38–126)
Anion gap: 6 (ref 5–15)
BUN: 55 mg/dL — ABNORMAL HIGH (ref 8–23)
CO2: 23 mmol/L (ref 22–32)
Calcium: 8.8 mg/dL — ABNORMAL LOW (ref 8.9–10.3)
Chloride: 110 mmol/L (ref 98–111)
Creatinine, Ser: 2.37 mg/dL — ABNORMAL HIGH (ref 0.61–1.24)
GFR, Estimated: 26 mL/min — ABNORMAL LOW (ref >60)
Glucose, Bld: 98 mg/dL (ref 70–99)
Potassium: 4.5 mmol/L (ref 3.5–5.1)
Sodium: 139 mmol/L (ref 135–145)
Total Bilirubin: 0.6 mg/dL (ref 0.0–1.2)
Total Protein: 7.4 g/dL (ref 6.5–8.1)

## 2023-12-26 LAB — URINALYSIS, ROUTINE W REFLEX MICROSCOPIC
Bilirubin Urine: NEGATIVE
Glucose, UA: NEGATIVE mg/dL
Ketones, ur: NEGATIVE mg/dL
Nitrite: NEGATIVE
Protein, ur: 100 mg/dL — AB
Specific Gravity, Urine: 1.012 (ref 1.005–1.030)
WBC, UA: >50 WBC/hpf (ref 0–5)
pH: 5 (ref 5.0–8.0)

## 2023-12-26 MED ORDER — SODIUM CHLORIDE 0.9 % IV SOLN
2.0000 g | Freq: Once | INTRAVENOUS | Status: AC
  Administered 2023-12-26: 2 g via INTRAVENOUS
  Filled 2023-12-26: qty 20

## 2023-12-26 MED ORDER — SODIUM CHLORIDE 0.9 % IV BOLUS
500.0000 mL | Freq: Once | INTRAVENOUS | Status: AC
  Administered 2023-12-26: 500 mL via INTRAVENOUS

## 2023-12-26 MED ORDER — CIPROFLOXACIN HCL 500 MG PO TABS: 500.0000 mg | ORAL_TABLET | Freq: Two times a day (BID) | ORAL | 0 refills | Status: DC

## 2023-12-26 NOTE — ED Notes (Signed)
 EDP at [facility]

## 2023-12-26 NOTE — ED Triage Notes (Signed)
 Pt brought by family for AMS and seeing things that aren't there. Pt is on home O2 at 2 L/M and foley catheter in place-last replaced on 3/26

## 2023-12-26 NOTE — ED Notes (Signed)
 Pt in CT.

## 2023-12-26 NOTE — ED Notes (Signed)
 Xray at [facility]

## 2023-12-26 NOTE — ED Provider Notes (Signed)
 [facility]PARTMENT AT [facility] Provider Note   CSN: [id number] Arrival date & time: 12/26/23  1809     History {Add pertinent medical, surgical, social history, OB history to HPI:1} Chief Complaint  Patient presents with   Altered Mental Status    [name] is a 89 y.o. male.  Patient with history of hypertension and indwelling Foley.  Patient has been more confused lately.  He has had urinary tract infections because of this before   Altered Mental Status      Home Medications Prior to Admission medications   Medication Sig Start Date End Date Taking? Authorizing Provider  ciprofloxacin (CIPRO) 500 MG tablet Take 1 tablet (500 mg total) by mouth 2 (two) times daily. One po bid x 7 days 12/26/23  Yes Bethann Berkshire, MD  acetaminophen (TYLENOL) 325 MG tablet Take 2 tablets (650 mg total) by mouth every 6 (six) hours as needed for mild pain (pain score 1-3), fever or headache (or Fever >/= 101). 11/19/23   Emokpae, Courage, MD  amLODipine (NORVASC) 10 MG tablet Take 1 tablet (10 mg total) by mouth daily. Hold for blood pressure reading <130 12/08/23   [name], NP  buPROPion (WELLBUTRIN) 100 MG tablet Take 1 tablet (100 mg total) by mouth 2 (two) times daily. 12/08/23   [name], NP  Calcium Carbonate Antacid 200 MG TBDP Take 200 mg by mouth daily.    [provider]  cephALEXin (KEFLEX) 500 MG capsule Take 1 capsule (500 mg total) by mouth 2 (two) times daily for 7 days. 12/22/23 12/29/23  Donnita Falls, FNP  ferrous sulfate 325 (65 FE) MG EC tablet Take 1 tablet (325 mg total) by mouth every Monday, Wednesday, and Friday. 12/08/23   [name], NP  fluticasone-salmeterol (ADVAIR) 250-50 MCG/ACT AEPB Inhale 1 puff into the lungs in the morning and at bedtime. 12/08/23   [name], NP  furosemide (LASIX) 40 MG tablet Take 1 tablet (40 mg total) by mouth every Monday, Wednesday, and Friday. 12/08/23 12/07/24  [name], NP  gabapentin (NEURONTIN) 250 MG/5ML solution Take 3 mLs (150 mg total) by mouth at bedtime. 12/08/23   [name], NP  labetalol (NORMODYNE) 200 MG tablet Take 1 tablet (200 mg total) by mouth 2 (two) times daily. Hold for blood pressure reading <130 12/08/23   [name], NP  levothyroxine (SYNTHROID) 150 MCG tablet Take 1 tablet (150 mcg total) by mouth daily. 12/08/23   [name], NP  Nutritional Supplements (PROSOURCE) LIQD Take 30 mLs by mouth 3 (three) times daily.    [provider]  polyethylene glycol (MIRALAX / GLYCOLAX) 17 g packet Take 17 g by mouth daily. 10/30/23   [place], Eric J, DO  senna-docusate (SENOKOT-S) 8.6-50 MG tablet Take 2 tablets by mouth at bedtime. 11/19/23   Shon Hale, MD  tamsulosin (FLOMAX) 0.4 MG CAPS capsule Take 1 capsule (0.4 mg total) by mouth daily after supper. 12/08/23   [name], NP  umeclidinium bromide (INCRUSE ELLIPTA) 62.5 MCG/ACT AEPB Inhale 1 puff into the lungs daily. 12/08/23   [name], NP      Allergies    Nsaids    Review of Systems   Review of Systems  Physical Exam Updated Vital Signs BP (!) 157/71   Pulse (!) 55   Temp 98.3 F (36.8 C) (Oral)   Resp 12   Ht 6\' 2"  (1.88 m)  Wt 57.9 kg   SpO2 100%   BMI 16.39 kg/m  Physical Exam  ED Results / Procedures / Treatments   Labs (all labs ordered are listed, but only abnormal results are displayed) Labs Reviewed  CBC WITH DIFFERENTIAL/PLATELET - Abnormal; Notable for the following components:      Result Value   WBC 2.5 (*)    RBC 3.63 (*)    Hemoglobin 10.6 (*)    HCT 33.9 (*)    RDW 16.1 (*)    Platelets 149 (*)    Neutro Abs 1.5 (*)    All other components within normal limits  COMPREHENSIVE METABOLIC PANEL WITH GFR - Abnormal; Notable for the following components:   BUN 55 (*)    Creatinine, Ser 2.37 (*)    Calcium 8.8 (*)    Albumin 3.1 (*)    ALT 48 (*)    GFR, Estimated 26 (*)    All other components  within normal limits  URINALYSIS, ROUTINE W REFLEX MICROSCOPIC - Abnormal; Notable for the following components:   Hgb urine dipstick MODERATE (*)    Protein, ur 100 (*)    Leukocytes,Ua LARGE (*)    Bacteria, UA RARE (*)    All other components within normal limits  RESP PANEL BY RT-PCR (RSV, FLU A&B, COVID)  RVPGX2  URINE CULTURE    EKG EKG Interpretation Date/Time:  Sunday December 26 2023 18:19:37 EDT Ventricular Rate:  62 PR Interval:  326 QRS Duration:  147 QT Interval:  453 QTC Calculation: 460 R Axis:   -57  Text Interpretation: Sinus or ectopic atrial rhythm Prolonged PR interval Consider left atrial enlargement RBBB and LAFB Confirmed by Bethann Berkshire [phone]) on 12/26/2023 9:10:37 PM  Radiology CT Head Wo Contrast Result Date: 12/26/2023 CLINICAL DATA:  Memory loss, altered mental status EXAM: CT HEAD WITHOUT CONTRAST TECHNIQUE: Contiguous axial images were obtained from the base of the skull through the vertex without intravenous contrast. RADIATION DOSE REDUCTION: This exam was performed according to the departmental dose-optimization program which includes automated exposure control, adjustment of the mA and/or kV according to patient size and/or use of iterative reconstruction technique. COMPARISON:  11/16/2023 FINDINGS: Brain: There is atrophy and chronic small vessel disease changes. No acute intracranial abnormality. Specifically, no hemorrhage, hydrocephalus, mass lesion, acute infarction, or significant intracranial injury. Vascular: No hyperdense vessel or unexpected calcification. Skull: No acute calvarial abnormality. Sinuses/Orbits: No acute findings Other: None IMPRESSION: Atrophy, chronic microvascular disease. No acute intracranial abnormality. Electronically Signed   By: Charlett Nose M.D.   On: 12/26/2023 19:09   DG Chest Port 1 View Result Date: 12/26/2023 CLINICAL DATA:  weak EXAM: PORTABLE CHEST 1 VIEW COMPARISON:  November 16, 2023, October 25, 2023 FINDINGS:  The cardiomediastinal silhouette is unchanged in contour.Atherosclerotic calcifications. No pleural effusion. No pneumothorax. Similar band like peripheral opacity of the RIGHT upper lobe with a rounded RIGHT hilar contour. Emphysematous changes. No acute pleuroparenchymal abnormality. IMPRESSION: 1. No acute cardiopulmonary abnormality. 2. Similar band like peripheral opacity of the RIGHT upper lobe with a rounded RIGHT hilar contour compared to prior and previously evaluated on chest CT dated October 25, 2023 Electronically Signed   By: Meda Klinefelter M.D.   On: 12/26/2023 18:39    Procedures Procedures  {Document cardiac monitor, telemetry assessment procedure when appropriate:1}  Medications Ordered in ED Medications  sodium chloride 0.9 % bolus 500 mL (0 mLs Intravenous Stopped 12/26/23 1933)  cefTRIAXone (ROCEPHIN) 2 g in sodium chloride 0.9 % 100 mL  IVPB (0 g Intravenous Stopped 12/26/23 2001)    ED Course/ Medical Decision Making/ A&P   {   Click here for ABCD2, HEART and other calculatorsREFRESH Note before signing :1}                              Medical Decision Making Amount and/or Complexity of Data Reviewed Labs: ordered. Radiology: ordered.  Risk Prescription drug management.  Urinary tract infection.  Patient will be put on Cipro follow-up with PCP  {Document critical care time when appropriate:1} {Document review of labs and clinical decision tools ie heart score, Chads2Vasc2 etc:1}  {Document your independent review of radiology images, and any outside records:1} {Document your discussion with family members, caretakers, and with consultants:1} {Document social determinants of health affecting pt's care:1} {Document your decision making why or why not admission, treatments were needed:1} Final Clinical Impression(s) / ED Diagnoses Final diagnoses:  Acute cystitis with hematuria    Rx / DC Orders ED Discharge Orders          Ordered    ciprofloxacin  (CIPRO) 500 MG tablet  2 times daily        12/26/23 2121

## 2023-12-26 NOTE — Discharge Instructions (Signed)
Follow-up with your doctor in a couple days for recheck °

## 2023-12-27 DIAGNOSIS — N184 Chronic kidney disease, stage 4 (severe): Secondary | ICD-10-CM | POA: Diagnosis not present

## 2023-12-27 DIAGNOSIS — D509 Iron deficiency anemia, unspecified: Secondary | ICD-10-CM | POA: Diagnosis not present

## 2023-12-27 DIAGNOSIS — E039 Hypothyroidism, unspecified: Secondary | ICD-10-CM | POA: Diagnosis not present

## 2023-12-27 DIAGNOSIS — I700 Atherosclerosis of aorta: Secondary | ICD-10-CM | POA: Diagnosis not present

## 2023-12-27 DIAGNOSIS — Z9981 Dependence on supplemental oxygen: Secondary | ICD-10-CM | POA: Diagnosis not present

## 2023-12-27 DIAGNOSIS — Z87891 Personal history of nicotine dependence: Secondary | ICD-10-CM | POA: Diagnosis not present

## 2023-12-27 DIAGNOSIS — J439 Emphysema, unspecified: Secondary | ICD-10-CM | POA: Diagnosis not present

## 2023-12-27 DIAGNOSIS — I5032 Chronic diastolic (congestive) heart failure: Secondary | ICD-10-CM | POA: Diagnosis not present

## 2023-12-27 DIAGNOSIS — Z466 Encounter for fitting and adjustment of urinary device: Secondary | ICD-10-CM | POA: Diagnosis not present

## 2023-12-27 DIAGNOSIS — I130 Hypertensive heart and chronic kidney disease with heart failure and stage 1 through stage 4 chronic kidney disease, or unspecified chronic kidney disease: Secondary | ICD-10-CM | POA: Diagnosis not present

## 2023-12-27 DIAGNOSIS — J9611 Chronic respiratory failure with hypoxia: Secondary | ICD-10-CM | POA: Diagnosis not present

## 2023-12-27 DIAGNOSIS — E46 Unspecified protein-calorie malnutrition: Secondary | ICD-10-CM | POA: Diagnosis not present

## 2023-12-28 LAB — URINE CULTURE: Culture: <10000 — AB

## 2023-12-29 DIAGNOSIS — Z87891 Personal history of nicotine dependence: Secondary | ICD-10-CM | POA: Diagnosis not present

## 2023-12-29 DIAGNOSIS — I5032 Chronic diastolic (congestive) heart failure: Secondary | ICD-10-CM | POA: Diagnosis not present

## 2023-12-29 DIAGNOSIS — J9611 Chronic respiratory failure with hypoxia: Secondary | ICD-10-CM | POA: Diagnosis not present

## 2023-12-29 DIAGNOSIS — N184 Chronic kidney disease, stage 4 (severe): Secondary | ICD-10-CM | POA: Diagnosis not present

## 2023-12-29 DIAGNOSIS — Z9981 Dependence on supplemental oxygen: Secondary | ICD-10-CM | POA: Diagnosis not present

## 2023-12-29 DIAGNOSIS — I130 Hypertensive heart and chronic kidney disease with heart failure and stage 1 through stage 4 chronic kidney disease, or unspecified chronic kidney disease: Secondary | ICD-10-CM | POA: Diagnosis not present

## 2023-12-29 DIAGNOSIS — J439 Emphysema, unspecified: Secondary | ICD-10-CM | POA: Diagnosis not present

## 2023-12-29 DIAGNOSIS — I700 Atherosclerosis of aorta: Secondary | ICD-10-CM | POA: Diagnosis not present

## 2023-12-29 DIAGNOSIS — D509 Iron deficiency anemia, unspecified: Secondary | ICD-10-CM | POA: Diagnosis not present

## 2023-12-29 DIAGNOSIS — E039 Hypothyroidism, unspecified: Secondary | ICD-10-CM | POA: Diagnosis not present

## 2023-12-29 DIAGNOSIS — Z466 Encounter for fitting and adjustment of urinary device: Secondary | ICD-10-CM | POA: Diagnosis not present

## 2023-12-29 DIAGNOSIS — E46 Unspecified protein-calorie malnutrition: Secondary | ICD-10-CM | POA: Diagnosis not present

## 2024-01-01 DIAGNOSIS — J441 Chronic obstructive pulmonary disease with (acute) exacerbation: Secondary | ICD-10-CM | POA: Diagnosis not present

## 2024-01-01 DIAGNOSIS — R0902 Hypoxemia: Secondary | ICD-10-CM | POA: Diagnosis not present

## 2024-01-01 DIAGNOSIS — R2689 Other abnormalities of gait and mobility: Secondary | ICD-10-CM | POA: Diagnosis not present

## 2024-01-01 DIAGNOSIS — R262 Difficulty in walking, not elsewhere classified: Secondary | ICD-10-CM | POA: Diagnosis not present

## 2024-01-01 DIAGNOSIS — R531 Weakness: Secondary | ICD-10-CM | POA: Diagnosis not present

## 2024-01-01 DIAGNOSIS — J9601 Acute respiratory failure with hypoxia: Secondary | ICD-10-CM | POA: Diagnosis not present

## 2024-01-01 DIAGNOSIS — I110 Hypertensive heart disease with heart failure: Secondary | ICD-10-CM | POA: Diagnosis not present

## 2024-01-01 DIAGNOSIS — C3412 Malignant neoplasm of upper lobe, left bronchus or lung: Secondary | ICD-10-CM | POA: Diagnosis not present

## 2024-01-01 DIAGNOSIS — J449 Chronic obstructive pulmonary disease, unspecified: Secondary | ICD-10-CM | POA: Diagnosis not present

## 2024-01-01 DIAGNOSIS — I509 Heart failure, unspecified: Secondary | ICD-10-CM | POA: Diagnosis not present

## 2024-01-01 DIAGNOSIS — N184 Chronic kidney disease, stage 4 (severe): Secondary | ICD-10-CM | POA: Diagnosis not present

## 2024-01-01 DIAGNOSIS — M6281 Muscle weakness (generalized): Secondary | ICD-10-CM | POA: Diagnosis not present

## 2024-01-03 ENCOUNTER — Encounter (HOSPITAL_COMMUNITY): Payer: Self-pay

## 2024-01-03 ENCOUNTER — Emergency Department (HOSPITAL_COMMUNITY)

## 2024-01-03 ENCOUNTER — Inpatient Hospital Stay: Payer: Medicare HMO | Attending: Internal Medicine

## 2024-01-03 ENCOUNTER — Inpatient Hospital Stay (HOSPITAL_COMMUNITY)
Admission: EM | Admit: 2024-01-03 | Discharge: 2024-01-07 | DRG: 682 | Disposition: A | Discharge status: Hospice | Attending: Family Medicine | Admitting: Family Medicine

## 2024-01-03 ENCOUNTER — Ambulatory Visit (HOSPITAL_COMMUNITY)

## 2024-01-03 ENCOUNTER — Other Ambulatory Visit: Payer: Self-pay

## 2024-01-03 DIAGNOSIS — F03A0 Unspecified dementia, mild, without behavioral disturbance, psychotic disturbance, mood disturbance, and anxiety: Secondary | ICD-10-CM

## 2024-01-03 DIAGNOSIS — Z1152 Encounter for screening for COVID-19: Secondary | ICD-10-CM | POA: Diagnosis not present

## 2024-01-03 DIAGNOSIS — Z79890 Hormone replacement therapy (postmenopausal): Secondary | ICD-10-CM

## 2024-01-03 DIAGNOSIS — R531 Weakness: Secondary | ICD-10-CM | POA: Diagnosis not present

## 2024-01-03 DIAGNOSIS — Z515 Encounter for palliative care: Secondary | ICD-10-CM | POA: Diagnosis not present

## 2024-01-03 DIAGNOSIS — C3490 Malignant neoplasm of unspecified part of unspecified bronchus or lung: Secondary | ICD-10-CM | POA: Diagnosis present

## 2024-01-03 DIAGNOSIS — Z87891 Personal history of nicotine dependence: Secondary | ICD-10-CM | POA: Diagnosis not present

## 2024-01-03 DIAGNOSIS — R339 Retention of urine, unspecified: Secondary | ICD-10-CM | POA: Diagnosis not present

## 2024-01-03 DIAGNOSIS — E43 Unspecified severe protein-calorie malnutrition: Secondary | ICD-10-CM | POA: Diagnosis present

## 2024-01-03 DIAGNOSIS — J9611 Chronic respiratory failure with hypoxia: Secondary | ICD-10-CM | POA: Diagnosis present

## 2024-01-03 DIAGNOSIS — E039 Hypothyroidism, unspecified: Secondary | ICD-10-CM | POA: Diagnosis present

## 2024-01-03 DIAGNOSIS — D72819 Decreased white blood cell count, unspecified: Secondary | ICD-10-CM | POA: Diagnosis not present

## 2024-01-03 DIAGNOSIS — M1990 Unspecified osteoarthritis, unspecified site: Secondary | ICD-10-CM | POA: Diagnosis present

## 2024-01-03 DIAGNOSIS — G9341 Metabolic encephalopathy: Secondary | ICD-10-CM | POA: Diagnosis not present

## 2024-01-03 DIAGNOSIS — F03A18 Unspecified dementia, mild, with other behavioral disturbance: Secondary | ICD-10-CM | POA: Diagnosis present

## 2024-01-03 DIAGNOSIS — R4182 Altered mental status, unspecified: Secondary | ICD-10-CM | POA: Diagnosis not present

## 2024-01-03 DIAGNOSIS — N179 Acute kidney failure, unspecified: Secondary | ICD-10-CM | POA: Diagnosis not present

## 2024-01-03 DIAGNOSIS — Z886 Allergy status to analgesic agent status: Secondary | ICD-10-CM

## 2024-01-03 DIAGNOSIS — I509 Heart failure, unspecified: Secondary | ICD-10-CM | POA: Diagnosis not present

## 2024-01-03 DIAGNOSIS — I6782 Cerebral ischemia: Secondary | ICD-10-CM | POA: Diagnosis not present

## 2024-01-03 DIAGNOSIS — Z9981 Dependence on supplemental oxygen: Secondary | ICD-10-CM

## 2024-01-03 DIAGNOSIS — Z993 Dependence on wheelchair: Secondary | ICD-10-CM

## 2024-01-03 DIAGNOSIS — Z681 Body mass index (BMI) 19 or less, adult: Secondary | ICD-10-CM

## 2024-01-03 DIAGNOSIS — I700 Atherosclerosis of aorta: Secondary | ICD-10-CM | POA: Diagnosis not present

## 2024-01-03 DIAGNOSIS — F0390 Unspecified dementia without behavioral disturbance: Secondary | ICD-10-CM | POA: Diagnosis not present

## 2024-01-03 DIAGNOSIS — D638 Anemia in other chronic diseases classified elsewhere: Secondary | ICD-10-CM | POA: Diagnosis present

## 2024-01-03 DIAGNOSIS — C779 Secondary and unspecified malignant neoplasm of lymph node, unspecified: Secondary | ICD-10-CM | POA: Diagnosis present

## 2024-01-03 DIAGNOSIS — I130 Hypertensive heart and chronic kidney disease with heart failure and stage 1 through stage 4 chronic kidney disease, or unspecified chronic kidney disease: Secondary | ICD-10-CM | POA: Diagnosis present

## 2024-01-03 DIAGNOSIS — R410 Disorientation, unspecified: Secondary | ICD-10-CM | POA: Diagnosis not present

## 2024-01-03 DIAGNOSIS — R627 Adult failure to thrive: Secondary | ICD-10-CM | POA: Diagnosis present

## 2024-01-03 DIAGNOSIS — F03918 Unspecified dementia, unspecified severity, with other behavioral disturbance: Secondary | ICD-10-CM | POA: Insufficient documentation

## 2024-01-03 DIAGNOSIS — Z79899 Other long term (current) drug therapy: Secondary | ICD-10-CM

## 2024-01-03 DIAGNOSIS — Z7189 Other specified counseling: Secondary | ICD-10-CM | POA: Diagnosis not present

## 2024-01-03 DIAGNOSIS — N184 Chronic kidney disease, stage 4 (severe): Secondary | ICD-10-CM | POA: Diagnosis not present

## 2024-01-03 DIAGNOSIS — D631 Anemia in chronic kidney disease: Secondary | ICD-10-CM | POA: Diagnosis present

## 2024-01-03 DIAGNOSIS — J439 Emphysema, unspecified: Secondary | ICD-10-CM | POA: Diagnosis not present

## 2024-01-03 DIAGNOSIS — D509 Iron deficiency anemia, unspecified: Secondary | ICD-10-CM | POA: Diagnosis not present

## 2024-01-03 DIAGNOSIS — I10 Essential (primary) hypertension: Secondary | ICD-10-CM | POA: Diagnosis not present

## 2024-01-03 DIAGNOSIS — Z87440 Personal history of urinary (tract) infections: Secondary | ICD-10-CM | POA: Diagnosis not present

## 2024-01-03 DIAGNOSIS — Z7951 Long term (current) use of inhaled steroids: Secondary | ICD-10-CM

## 2024-01-03 DIAGNOSIS — E46 Unspecified protein-calorie malnutrition: Secondary | ICD-10-CM | POA: Diagnosis not present

## 2024-01-03 DIAGNOSIS — I5032 Chronic diastolic (congestive) heart failure: Secondary | ICD-10-CM | POA: Diagnosis not present

## 2024-01-03 DIAGNOSIS — Z66 Do not resuscitate: Secondary | ICD-10-CM | POA: Diagnosis not present

## 2024-01-03 DIAGNOSIS — Z7401 Bed confinement status: Secondary | ICD-10-CM | POA: Diagnosis not present

## 2024-01-03 DIAGNOSIS — E860 Dehydration: Secondary | ICD-10-CM | POA: Diagnosis not present

## 2024-01-03 DIAGNOSIS — Z466 Encounter for fitting and adjustment of urinary device: Secondary | ICD-10-CM | POA: Diagnosis not present

## 2024-01-03 LAB — RESP PANEL BY RT-PCR (RSV, FLU A&B, COVID)  RVPGX2
Influenza A by PCR: NEGATIVE
Influenza B by PCR: NEGATIVE
Resp Syncytial Virus by PCR: NEGATIVE
SARS Coronavirus 2 by RT PCR: NEGATIVE

## 2024-01-03 LAB — COMPREHENSIVE METABOLIC PANEL WITH GFR
ALT: 43 U/L (ref 0–44)
AST: 33 U/L (ref 15–41)
Albumin: 3.5 g/dL (ref 3.5–5.0)
Alkaline Phosphatase: 90 U/L (ref 38–126)
Anion gap: 11 (ref 5–15)
BUN: 55 mg/dL — ABNORMAL HIGH (ref 8–23)
CO2: 22 mmol/L (ref 22–32)
Calcium: 9.2 mg/dL (ref 8.9–10.3)
Chloride: 105 mmol/L (ref 98–111)
Creatinine, Ser: 3.56 mg/dL — ABNORMAL HIGH (ref 0.61–1.24)
GFR, Estimated: 16 mL/min — ABNORMAL LOW (ref >60)
Glucose, Bld: 96 mg/dL (ref 70–99)
Potassium: 4.8 mmol/L (ref 3.5–5.1)
Sodium: 138 mmol/L (ref 135–145)
Total Bilirubin: 0.7 mg/dL (ref 0.0–1.2)
Total Protein: 7.8 g/dL (ref 6.5–8.1)

## 2024-01-03 LAB — BLOOD GAS, VENOUS
Acid-base deficit: 2 mmol/L (ref 0.0–2.0)
Bicarbonate: 23.7 mmol/L (ref 20.0–28.0)
Drawn by: 67337
O2 Saturation: 57.2 %
Patient temperature: 36.9
pCO2, Ven: 43 mmHg — ABNORMAL LOW (ref 44–60)
pH, Ven: 7.35 (ref 7.25–7.43)
pO2, Ven: 33 mmHg (ref 32–45)

## 2024-01-03 LAB — CBC WITH DIFFERENTIAL/PLATELET
Abs Immature Granulocytes: 0.02 10*3/uL (ref 0.00–0.07)
Basophils Absolute: 0 10*3/uL (ref 0.0–0.1)
Basophils Relative: 0 %
Eosinophils Absolute: 0 10*3/uL (ref 0.0–0.5)
Eosinophils Relative: 1 %
HCT: 36.2 % — ABNORMAL LOW (ref 39.0–52.0)
Hemoglobin: 11.4 g/dL — ABNORMAL LOW (ref 13.0–17.0)
Immature Granulocytes: 1 %
Lymphocytes Relative: 32 %
Lymphs Abs: 1.2 10*3/uL (ref 0.7–4.0)
MCH: 29.7 pg (ref 26.0–34.0)
MCHC: 31.5 g/dL (ref 30.0–36.0)
MCV: 94.3 fL (ref 80.0–100.0)
Monocytes Absolute: 0.3 10*3/uL (ref 0.1–1.0)
Monocytes Relative: 7 %
Neutro Abs: 2.3 10*3/uL (ref 1.7–7.7)
Neutrophils Relative %: 59 %
Platelets: 156 10*3/uL (ref 150–400)
RBC: 3.84 MIL/uL — ABNORMAL LOW (ref 4.22–5.81)
RDW: 16.6 % — ABNORMAL HIGH (ref 11.5–15.5)
WBC: 3.9 10*3/uL — ABNORMAL LOW (ref 4.0–10.5)
nRBC: 0 % (ref 0.0–0.2)

## 2024-01-03 LAB — AMMONIA: Ammonia: 11 umol/L (ref 9–35)

## 2024-01-03 LAB — CBG MONITORING, ED: Glucose-Capillary: 85 mg/dL (ref 70–99)

## 2024-01-03 LAB — ETHANOL: Alcohol, Ethyl (B): <10 mg/dL (ref <10)

## 2024-01-03 MED ORDER — LACTATED RINGERS IV BOLUS
1000.0000 mL | Freq: Once | INTRAVENOUS | Status: AC
  Administered 2024-01-03: 1000 mL via INTRAVENOUS

## 2024-01-03 NOTE — ED Triage Notes (Signed)
 Pt arrived via POV from home c/o on-going altered mental status. Per family, Pt is experiencing A/V  hallucinations. Pts occupational therapist at his home today reported concern for Pts continued altered mental status after being treated with antibiotics for recent UTI. Pt O2 Sats 91% on room air.

## 2024-01-03 NOTE — ED Provider Notes (Signed)
 [facility] EMERGENCY DEPARTMENT AT [facility] Provider Note   CSN: [id number] Arrival date & time: 01/03/24  1712     History {Add pertinent medical, surgical, social history, OB history to HPI:1} Chief Complaint  Patient presents with   Altered Mental Status    [name] is a 89 y.o. male.  89 year old male with history of dementia, CHF, COPD on 2L, lung cancer, and urinary retention with indwelling Foley catheter who presents emergency department with confusion.  Over the past year patient has been having intermittent confusion.  Worsened over the past week.  Was seen in the emergency department and diagnosed with a UTI but ultimately the urine culture was negative.  Since going home has been more confused than usual.  Has been too weak to work with therapy at home as well.  Has had a cough recently.  No other infectious symptoms.  No medication changes.       Home Medications Prior to Admission medications   Medication Sig Start Date End Date Taking? Authorizing Provider  acetaminophen (TYLENOL) 325 MG tablet Take 2 tablets (650 mg total) by mouth every 6 (six) hours as needed for mild pain (pain score 1-3), fever or headache (or Fever >/= 101). 11/19/23   Emokpae, Courage, MD  amLODipine (NORVASC) 10 MG tablet Take 1 tablet (10 mg total) by mouth daily. Hold for blood pressure reading <130 12/08/23   [name], NP  buPROPion (WELLBUTRIN) 100 MG tablet Take 1 tablet (100 mg total) by mouth 2 (two) times daily. 12/08/23   [name], NP  Calcium Carbonate Antacid 200 MG TBDP Take 200 mg by mouth daily.    [provider]  ciprofloxacin (CIPRO) 500 MG tablet Take 1 tablet (500 mg total) by mouth 2 (two) times daily. One po bid x 7 days 12/26/23   Bethann Berkshire, MD  ferrous sulfate 325 (65 FE) MG EC tablet Take 1 tablet (325 mg total) by mouth every Monday, Wednesday, and Friday. 12/08/23   [name], NP  fluticasone-salmeterol (ADVAIR)  250-50 MCG/ACT AEPB Inhale 1 puff into the lungs in the morning and at bedtime. 12/08/23   [name], NP  furosemide (LASIX) 40 MG tablet Take 1 tablet (40 mg total) by mouth every Monday, Wednesday, and Friday. 12/08/23 12/07/24  [name], NP  gabapentin (NEURONTIN) 250 MG/5ML solution Take 3 mLs (150 mg total) by mouth at bedtime. 12/08/23   [name], NP  labetalol (NORMODYNE) 200 MG tablet Take 1 tablet (200 mg total) by mouth 2 (two) times daily. Hold for blood pressure reading <130 12/08/23   [name], NP  levothyroxine (SYNTHROID) 150 MCG tablet Take 1 tablet (150 mcg total) by mouth daily. 12/08/23   [name], NP  Nutritional Supplements (PROSOURCE) LIQD Take 30 mLs by mouth 3 (three) times daily.    [provider]  polyethylene glycol (MIRALAX / GLYCOLAX) 17 g packet Take 17 g by mouth daily. 10/30/23   [place], Eric J, DO  senna-docusate (SENOKOT-S) 8.6-50 MG tablet Take 2 tablets by mouth at bedtime. 11/19/23   Shon Hale, MD  tamsulosin (FLOMAX) 0.4 MG CAPS capsule Take 1 capsule (0.4 mg total) by mouth daily after supper. 12/08/23   [name], NP  umeclidinium bromide (INCRUSE ELLIPTA) 62.5 MCG/ACT AEPB Inhale 1 puff into the lungs daily. 12/08/23   [name], NP      Allergies    Nsaids  Review of Systems   Review of Systems  Physical Exam Updated Vital Signs BP 132/77 (BP Location: Right Arm)   Pulse 72   Temp 98.5 F (36.9 C) (Oral)   Resp 18   Ht 6\' 2"  (1.88 m)   Wt 57.9 kg   SpO2 91%   BMI 16.39 kg/m  Physical Exam Vitals and nursing note reviewed.  Constitutional:      General: He is not in acute distress.    Appearance: He is well-developed.     Comments: Alert and oriented to self only.  Not at baseline per family.  HENT:     Head: Normocephalic and atraumatic.     Right Ear: External ear normal.     Left Ear: External ear normal.     Nose: Nose normal.  Eyes:     Extraocular Movements:  Extraocular movements intact.     Conjunctiva/sclera: Conjunctivae normal.     Pupils: Pupils are equal, round, and reactive to light.  Cardiovascular:     Rate and Rhythm: Normal rate and regular rhythm.     Heart sounds: Normal heart sounds.  Pulmonary:     Effort: Pulmonary effort is normal. No respiratory distress.     Breath sounds: Normal breath sounds.  Musculoskeletal:     Cervical back: Normal range of motion and neck supple.     Right lower leg: No edema.     Left lower leg: No edema.  Skin:    General: Skin is warm and dry.  Neurological:     Mental Status: He is alert.     Cranial Nerves: No cranial nerve deficit.     Sensory: No sensory deficit.     Motor: No weakness.  Psychiatric:        Mood and Affect: Mood normal.        Behavior: Behavior normal.     ED Results / Procedures / Treatments   Labs (all labs ordered are listed, but only abnormal results are displayed) Labs Reviewed  CBG MONITORING, ED    EKG None  Radiology No results found.  Procedures Procedures  {Document cardiac monitor, telemetry assessment procedure when appropriate:1}  Medications Ordered in ED Medications - No data to display  ED Course/ Medical Decision Making/ A&P Clinical Course as of 01/03/24 2210  Mon Jan 03, 2024  2206 Creatinine(!): 3.56 2.5 baseline [RP]    Clinical Course User Index [RP] Rondel Baton, MD   {   Click here for ABCD2, HEART and other calculatorsREFRESH Note before signing :1}                              Medical Decision Making Amount and/or Complexity of Data Reviewed Labs: ordered. Decision-making details documented in ED Course. Radiology: ordered.   ***  {Document critical care time when appropriate:1} {Document review of labs and clinical decision tools ie heart score, Chads2Vasc2 etc:1}  {Document your independent review of radiology images, and any outside records:1} {Document your discussion with family members, caretakers,  and with consultants:1} {Document social determinants of health affecting pt's care:1} {Document your decision making why or why not admission, treatments were needed:1} Final Clinical Impression(s) / ED Diagnoses Final diagnoses:  None    Rx / DC Orders ED Discharge Orders     None

## 2024-01-04 ENCOUNTER — Inpatient Hospital Stay (HOSPITAL_COMMUNITY)

## 2024-01-04 ENCOUNTER — Encounter (HOSPITAL_COMMUNITY): Payer: Self-pay | Admitting: Internal Medicine

## 2024-01-04 DIAGNOSIS — D72819 Decreased white blood cell count, unspecified: Secondary | ICD-10-CM | POA: Diagnosis not present

## 2024-01-04 DIAGNOSIS — G9341 Metabolic encephalopathy: Secondary | ICD-10-CM

## 2024-01-04 DIAGNOSIS — E43 Unspecified severe protein-calorie malnutrition: Secondary | ICD-10-CM | POA: Insufficient documentation

## 2024-01-04 DIAGNOSIS — E039 Hypothyroidism, unspecified: Secondary | ICD-10-CM

## 2024-01-04 DIAGNOSIS — N184 Chronic kidney disease, stage 4 (severe): Secondary | ICD-10-CM

## 2024-01-04 DIAGNOSIS — N179 Acute kidney failure, unspecified: Secondary | ICD-10-CM | POA: Diagnosis not present

## 2024-01-04 DIAGNOSIS — R627 Adult failure to thrive: Secondary | ICD-10-CM | POA: Diagnosis not present

## 2024-01-04 DIAGNOSIS — R339 Retention of urine, unspecified: Secondary | ICD-10-CM

## 2024-01-04 DIAGNOSIS — I10 Essential (primary) hypertension: Secondary | ICD-10-CM

## 2024-01-04 DIAGNOSIS — F03918 Unspecified dementia, unspecified severity, with other behavioral disturbance: Secondary | ICD-10-CM | POA: Insufficient documentation

## 2024-01-04 LAB — URINALYSIS, ROUTINE W REFLEX MICROSCOPIC
Bilirubin Urine: NEGATIVE
Glucose, UA: NEGATIVE mg/dL
Ketones, ur: NEGATIVE mg/dL
Nitrite: NEGATIVE
Protein, ur: 30 mg/dL — AB
Specific Gravity, Urine: 1.008 (ref 1.005–1.030)
pH: 5 (ref 5.0–8.0)

## 2024-01-04 LAB — RAPID URINE DRUG SCREEN, HOSP PERFORMED
Amphetamines: NOT DETECTED
Barbiturates: NOT DETECTED
Benzodiazepines: NOT DETECTED
Cocaine: NOT DETECTED
Opiates: NOT DETECTED
Tetrahydrocannabinol: NOT DETECTED

## 2024-01-04 MED ORDER — AMLODIPINE BESYLATE 5 MG PO TABS
10.0000 mg | ORAL_TABLET | Freq: Every day | ORAL | Status: DC
  Administered 2024-01-04 – 2024-01-07 (×4): 10 mg via ORAL
  Filled 2024-01-04 (×4): qty 2

## 2024-01-04 MED ORDER — GABAPENTIN 250 MG/5ML PO SOLN
150.0000 mg | Freq: Every day | ORAL | Status: DC
  Administered 2024-01-04 – 2024-01-06 (×2): 150 mg via ORAL
  Filled 2024-01-04 (×4): qty 3

## 2024-01-04 MED ORDER — PROSOURCE PO LIQD: 30.0000 mL | Freq: Three times a day (TID) | ORAL | Status: DC

## 2024-01-04 MED ORDER — HYDRALAZINE HCL 20 MG/ML IJ SOLN: 5.0000 mg | Freq: Four times a day (QID) | INTRAMUSCULAR | Status: DC | PRN

## 2024-01-04 MED ORDER — CHLORHEXIDINE GLUCONATE CLOTH 2 % EX PADS
6.0000 | MEDICATED_PAD | Freq: Every day | CUTANEOUS | Status: DC
  Administered 2024-01-04 – 2024-01-07 (×4): 6 via TOPICAL

## 2024-01-04 MED ORDER — ENSURE ENLIVE PO LIQD
237.0000 mL | Freq: Two times a day (BID) | ORAL | Status: DC
  Administered 2024-01-04: 237 mL via ORAL

## 2024-01-04 MED ORDER — POLYETHYLENE GLYCOL 3350 17 G PO PACK
17.0000 g | PACK | Freq: Every day | ORAL | Status: DC
  Administered 2024-01-04 – 2024-01-07 (×4): 17 g via ORAL
  Filled 2024-01-04 (×4): qty 1

## 2024-01-04 MED ORDER — FLUTICASONE FUROATE-VILANTEROL 200-25 MCG/ACT IN AEPB
1.0000 | INHALATION_SPRAY | Freq: Every day | RESPIRATORY_TRACT | Status: DC
  Administered 2024-01-04 – 2024-01-07 (×4): 1 via RESPIRATORY_TRACT
  Filled 2024-01-04: qty 28

## 2024-01-04 MED ORDER — HEPARIN SODIUM (PORCINE) 5000 UNIT/ML IJ SOLN
5000.0000 [IU] | Freq: Three times a day (TID) | INTRAMUSCULAR | Status: DC
  Administered 2024-01-04 – 2024-01-07 (×11): 5000 [IU] via SUBCUTANEOUS
  Filled 2024-01-04 (×11): qty 1

## 2024-01-04 MED ORDER — MOMETASONE FURO-FORMOTEROL FUM 200-5 MCG/ACT IN AERO
2.0000 | INHALATION_SPRAY | Freq: Two times a day (BID) | RESPIRATORY_TRACT | Status: DC
  Filled 2024-01-04: qty 8.8

## 2024-01-04 MED ORDER — LACTATED RINGERS IV SOLN: INTRAVENOUS | Status: AC

## 2024-01-04 MED ORDER — BUPROPION HCL 100 MG PO TABS
100.0000 mg | ORAL_TABLET | Freq: Two times a day (BID) | ORAL | Status: DC
  Administered 2024-01-04 – 2024-01-07 (×7): 100 mg via ORAL
  Filled 2024-01-04 (×9): qty 1

## 2024-01-04 MED ORDER — LABETALOL HCL 200 MG PO TABS
200.0000 mg | ORAL_TABLET | Freq: Two times a day (BID) | ORAL | Status: DC
Start: 2024-01-04 — End: 2024-01-07
  Administered 2024-01-04 – 2024-01-07 (×7): 200 mg via ORAL
  Filled 2024-01-04 (×7): qty 1

## 2024-01-04 MED ORDER — LEVOTHYROXINE SODIUM 75 MCG PO TABS
150.0000 ug | ORAL_TABLET | Freq: Every day | ORAL | Status: DC
  Administered 2024-01-04 – 2024-01-07 (×4): 150 ug via ORAL
  Filled 2024-01-04 (×4): qty 2

## 2024-01-04 MED ORDER — LABETALOL HCL 5 MG/ML IV SOLN: 10.0000 mg | Freq: Four times a day (QID) | INTRAVENOUS | Status: DC | PRN

## 2024-01-04 MED ORDER — PROCHLORPERAZINE EDISYLATE 10 MG/2ML IJ SOLN: 10.0000 mg | Freq: Four times a day (QID) | INTRAMUSCULAR | Status: DC | PRN

## 2024-01-04 MED ORDER — TAMSULOSIN HCL 0.4 MG PO CAPS
0.4000 mg | ORAL_CAPSULE | Freq: Every day | ORAL | Status: DC
  Administered 2024-01-04 – 2024-01-06 (×3): 0.4 mg via ORAL
  Filled 2024-01-04 (×3): qty 1

## 2024-01-04 MED ORDER — ACETAMINOPHEN 325 MG PO TABS
650.0000 mg | ORAL_TABLET | Freq: Four times a day (QID) | ORAL | Status: DC | PRN
  Administered 2024-01-06: 650 mg via ORAL
  Filled 2024-01-04: qty 2

## 2024-01-04 MED ORDER — UMECLIDINIUM BROMIDE 62.5 MCG/ACT IN AEPB
1.0000 | INHALATION_SPRAY | Freq: Every day | RESPIRATORY_TRACT | Status: DC
  Administered 2024-01-04 – 2024-01-07 (×4): 1 via RESPIRATORY_TRACT
  Filled 2024-01-04: qty 7

## 2024-01-04 MED ORDER — ACETAMINOPHEN 650 MG RE SUPP: 650.0000 mg | Freq: Four times a day (QID) | RECTAL | Status: DC | PRN

## 2024-01-04 MED ORDER — SODIUM CHLORIDE 0.9 % IV SOLN
1.0000 g | INTRAVENOUS | Status: DC
  Administered 2024-01-04 – 2024-01-06 (×3): 1 g via INTRAVENOUS
  Filled 2024-01-04 (×3): qty 10

## 2024-01-04 MED ORDER — FERROUS SULFATE 325 (65 FE) MG PO TABS
325.0000 mg | ORAL_TABLET | ORAL | Status: DC
  Administered 2024-01-05 – 2024-01-07 (×2): 325 mg via ORAL
  Filled 2024-01-04 (×2): qty 1

## 2024-01-04 MED ORDER — ENSURE ENLIVE PO LIQD
237.0000 mL | Freq: Three times a day (TID) | ORAL | Status: DC
  Administered 2024-01-04 – 2024-01-07 (×8): 237 mL via ORAL

## 2024-01-04 MED ORDER — QUETIAPINE FUMARATE 25 MG PO TABS
25.0000 mg | ORAL_TABLET | Freq: Every day | ORAL | Status: DC
  Administered 2024-01-04 – 2024-01-06 (×3): 25 mg via ORAL
  Filled 2024-01-04 (×3): qty 1

## 2024-01-04 NOTE — ED Notes (Signed)
Dr Adefeso at bedside. 

## 2024-01-04 NOTE — ED Notes (Signed)
 Courtesy call to 3rd floor to inform pt status is over 40 minutes and pt will be transported upstairs now

## 2024-01-04 NOTE — Progress Notes (Signed)
 Progress Note   Patient: [name] MVH:[id number] DOB: [birth year] DOA: 01/03/2024     1 DOS: the patient was seen and examined on 01/04/2024 at 7:52AM      Brief hospital course: 89 y.o. M with dementia lives at home, wheelchair bound, lung CA metastatic to LN and right chest wall, chronic respiratory failure on 2L home O2, CKD IV baseline 2.4, chronic urinary retention with indwelling foley, HTN, and hypothyroidism who presented with AMS.  In the ER, Cr back up to 3.5. CTH normal. Admitted on fluids.     Assessment and Plan: Principal Problem:   Acute kidney injury superimposed on stage 4 chronic kidney disease (HCC) Baseline Cr appears to be in the 2.2-2.6 range in the last year.    On admission here it is back up to 3.5.   - IV fluids - Trend Cr - Avoid nephrotoxins    Active Problems:   Generalized weakness   Acute metabolic encephalopathy At baseline he is responsive to questions, conversational.  At present he is somnolent and unresponsive.  Unclear if this is from an infection, the AKI, strokes, or new mets to the brain. 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  At baseline, patient lives alone, requires assistance to get out of bed and transfer to toilet/wheelchair, and cannot walk.    Family check on him frequently and provide him food.  He has dementia they know, but recognizes them and they report he is oriented to place and time usually (notes from [facility] Alwyn Ren at [facility] over the last 4 months describe an individual who typically is not aware of his surroundings, or recent events, not a reliable historian, and so I am unable to reconcile the discrepancy at present).  He has lost 20% body weight since  last 8 months (15 kg) and now has BMI of 17 and clearly is not eating enough due to his dementia and cancer, despite loving attention by family.  Given his pattern of now fourth readmission in 3 months, if there is not a readily identifiable cause of his current condition (ie a stroke or bacteremia), I have told daughter/POA [name] that I suspect this is his body failing to thrive in the setting of metastatic cancer, dementia, weight loss and advanced renal failure and I would recommend either home with hospice or long term care placement with palliative care following. - Consult Palliative Care, appreciate expertise     Anemia of chronic disease   Essential hypertension   Hypothyroidism   Chronic respiratory failure with hypoxia (HCC)   Chronic urinary retention  - Continue amlodipine, Breo, Ensure, Labetalol, levothyroxine, Seroquel, Flomax, Incruse             Physical Exam: BP (!) 142/67 (BP Location: Right Arm)   Pulse (!) 51   Temp (!) 97.5 F (36.4 C) (Axillary)   Resp 18   Ht 6\' 2"  (1.88 m)   Wt 60.2 kg   SpO2 100%   BMI 17.04 kg/m   Patient seen and examined, cachectic elderly male, mitts and restraints in place, lying in bed, does not open his eyes or make purposeful movements.    Family Communication: Daughter    Disposition: Status is: Inpatient  Author: Alberteen Sam, MD 01/04/2024 2:29 PM  For on call review [email or web address].

## 2024-01-04 NOTE — Plan of Care (Signed)

## 2024-01-04 NOTE — Plan of Care (Signed)
  Problem: Education: Goal: Knowledge of General Education information will improve Description: Including pain rating scale, medication(s)/side effects and non-pharmacologic comfort measures Outcome: Progressing   Problem: Health Behavior/Discharge Planning: Goal: Ability to manage health-related needs will improve Outcome: Progressing   Problem: Clinical Measurements: Goal: Ability to maintain clinical measurements within normal limits will improve Outcome: Progressing   Problem: Clinical Measurements: Goal: Will remain free from infection Outcome: Progressing   Problem: Clinical Measurements: Goal: Diagnostic test results will improve Outcome: Progressing   Problem: Clinical Measurements: Goal: Respiratory complications will improve Outcome: Progressing   Problem: Activity: Goal: Risk for activity intolerance will decrease Outcome: Progressing   Problem: Clinical Measurements: Goal: Cardiovascular complication will be avoided Outcome: Progressing

## 2024-01-04 NOTE — Hospital Course (Addendum)
 89 y.o. M with dementia lives at home, wheelchair bound, lung CA metastatic to LN and right chest wall, chronic respiratory failure on 2L home O2, CKD IV baseline 2.4, chronic urinary retention with indwelling foley, HTN, and hypothyroidism who presented with AMS.  In the ER, Cr back up to 3.5. CTH normal. Admitted on fluids.

## 2024-01-04 NOTE — Progress Notes (Addendum)
 Initial Nutrition Assessment  DOCUMENTATION CODES:   Underweight  INTERVENTION:   Ensure Enlive po TID, each supplement provides 350 kcal and 20 grams of protein. Magic cup TID with meals, each supplement provides 290 kcal and 9 grams of protein. MVI with minerals daily. Liberalize diet to regular.   NUTRITION DIAGNOSIS:   Inadequate oral intake related to decreased appetite as evidenced by percent weight loss (18% weight loss within 6 months).  GOAL:   Patient will meet greater than or equal to 90% of their needs  MONITOR:   PO intake, Supplement acceptance  REASON FOR ASSESSMENT:   Consult Assessment of nutrition requirement/status, Poor PO  ASSESSMENT:   89 yo male admitted with AKI on CKD stage 4, dehydration, FTT. PMH includes HTN, hypothyroidism, arthritis, lung cancer (2022), CKD stage 4, osteoarthritis, urinary retention.  Unable to speak with patient or complete NFPE at this time. Per review of usual weights in EMR, patient has had 18% weight loss over the past 6 months. He weighed 73.3 kg on 07/19/23, current weight 60.2 kg. He is underweight with BMI=17. Suspect patient is malnourished, however, unable to obtain enough information at this time for identification of malnutrition.   Currently on a heart healthy diet. Meal intakes not recorded. Ensure has been ordered BID and Prosource Plus TID. Patient accepted an Ensure this morning, but Prosource Plus was not given.   Labs reviewed.  Medications reviewed and include ferrous sulfate, miralax, IV rocephin.   NUTRITION - FOCUSED PHYSICAL EXAM:  Unable to complete  Diet Order:   Diet Order             Diet Heart Room service appropriate? Yes; Fluid consistency: Thin  Diet effective now                   EDUCATION NEEDS:   Not appropriate for education at this time  Skin:  Skin Assessment: Reviewed RN Assessment  Last BM:  4/7  Height:   Ht Readings from Last 1 Encounters:  01/03/24 6\' 2"   (1.88 m)    Weight:   Wt Readings from Last 1 Encounters:  01/04/24 60.2 kg    Ideal Body Weight:  86.4 kg  BMI:  Body mass index is 17.04 kg/m.  Estimated Nutritional Needs:   Kcal:  1800-2000  Protein:  85-95 gm  Fluid:  1.8-2 L   Gabriel Rainwater RD, LDN, CNSC Contact via secure chat. If unavailable, use group chat "RD Inpatient."

## 2024-01-04 NOTE — TOC Initial Note (Signed)
 Transition of Care [facility]) - Initial/Assessment Note    Patient Details  Name: [name] MRN: [id number] Date of Birth: 1933-07-11  Transition of Care [facility]) CM/SW Contact:    Elliot Gault, LCSW Phone Number: 01/04/2024, 1:47 PM  Clinical Narrative:                  Pt admitted from home. He has a high readmission risk score. Pt known to TOC from multiple previous admissions.  Spoke with pt's dtr to complete assessment. Pt has been at home with [facility] after his most recent dc from [facility]. Pt currently active with HH RN, OT, and SW. Dtr states pt has used his 100 days of SNF for this benefit period. He has not stayed out of the hospital or SNF for 60 consecutive days which is what would be needed for his benefit period to re-set. Explained this to pt's daughter who states that pt cannot private pay so he will have to return home with [facility] at dc. Dtr states that she and her sister have been providing assistance to pt at home as needed but neither of them live in the home with pt.  Pt has a cane, walker, w/c, BSC, and shower chair for DME at home. His daughters assist him in getting to appointments and getting his medications filled.  TOC will follow and assist with dc planning.   Expected Discharge Plan: Home w Home Health Services Barriers to Discharge: Continued Medical Work up   Patient Goals and CMS Choice Patient states their goals for this hospitalization and ongoing recovery are:: get better          Expected Discharge Plan and Services In-house Referral: Clinical Social Work   Post Acute Care Choice: Home Health, Resumption of Svcs/PTA Provider Living arrangements for the past 2 months: Single Family Home                                      Prior Living Arrangements/Services Living arrangements for the past 2 months: Single Family Home Lives with:: Self Patient language and need for interpreter reviewed:: Yes Do you feel safe going back to  the place where you live?: Yes      Need for Family Participation in Patient Care: Yes (Comment) Care giver support system in place?: Yes (comment) Current home services: DME, Home OT, Home RN Criminal Activity/Legal Involvement Pertinent to Current Situation/Hospitalization: No - Comment as needed  Activities of Daily Living   ADL Screening (condition at time of admission) Independently performs ADLs?: No Does the patient have a NEW difficulty with bathing/dressing/toileting/self-feeding that is expected to last >3 days?: No Does the patient have a NEW difficulty with getting in/out of bed, walking, or climbing stairs that is expected to last >3 days?: No Does the patient have a NEW difficulty with communication that is expected to last >3 days?: No Is the patient deaf or have difficulty hearing?: No Does the patient have difficulty seeing, even when wearing glasses/contacts?: Yes Does the patient have difficulty concentrating, remembering, or making decisions?: Yes  Permission Sought/Granted                  Emotional Assessment Appearance:: Appears stated age     Orientation: : Oriented to Self Alcohol / Substance Use: Not Applicable Psych Involvement: No (comment)  Admission diagnosis:  Acute kidney injury superimposed on stage 4 chronic  kidney disease (HCC) [N17.9, N18.4] Patient Active Problem List   Diagnosis Date Noted   Leukopenia 01/04/2024   Acute kidney injury superimposed on stage 4 chronic kidney disease (HCC) 01/03/2024   Urinary retention 12/22/2023   Foley catheter in place 12/22/2023   Vascular dementia (HCC) 11/23/2023   Acute encephalopathy 11/16/2023   Anxiety and depression 11/16/2023   Hypothyroidism 11/16/2023   Chronic obstructive pulmonary disease (COPD) (HCC) 11/16/2023   Hyperkalemia 10/26/2023   Hypermagnesemia 10/26/2023   CAP (community acquired pneumonia) 10/25/2023   Acute metabolic encephalopathy 10/07/2023   Iron deficiency anemia  10/07/2023   Dehydration 10/07/2023   Prolonged QT interval 10/07/2023   Generalized weakness 10/07/2023   Failure to thrive in adult 10/07/2023   Hypoalbuminemia due to protein-calorie malnutrition (HCC) 10/07/2023   Chronic respiratory failure with hypoxia (HCC) 10/07/2023   Anxiety 09/27/2023   Mixed anxiety and depressive disorder 09/27/2023   Physical deconditioning 09/13/2023   Non-small cell lung cancer (HCC) 09/13/2023   Fecal occult blood test positive 08/20/2023   Normocytic anemia 08/20/2023   Hypotension 08/20/2023   Weight loss 08/09/2023   Situational depression 08/09/2023   Aortic atherosclerosis (HCC) 07/30/2023   Hypocalcemia 07/26/2023   Benign hypertension with coincident congestive heart failure (HCC) 07/22/2023   Benign hypertension with chronic kidney disease, stage IV (HCC) 07/22/2023   Severe protein-calorie malnutrition (HCC) 07/22/2023   Chronic diastolic CHF (congestive heart failure) (HCC) 07/10/2023   Thyromegaly 07/08/2023   Peripheral edema 07/08/2023   Hypertensive crisis 07/08/2023   COPD (chronic obstructive pulmonary disease) (HCC) 07/07/2023   Vitiligo 12/17/2021   Chronic kidney disease, stage 4 (severe) (HCC) 07/18/2021   Essential hypertension 04/28/2021   Acquired hypothyroidism 04/28/2021   Underweight 04/28/2021   Chronic pain syndrome 04/28/2021   Carcinoma of prostate (HCC) 04/28/2021   Benign prostatic hyperplasia 04/28/2021   Osteoarthritis of knees, bilateral 04/28/2021   Dysphagia 04/10/2021   Anemia of chronic disease 02/10/2021   Cancer associated pain 12/23/2020   Primary cancer of right upper lobe of lung (HCC) 11/07/2020   PCP:  [name], MD Pharmacy:   [facility] - [place], [place] - [address] [place] [address] Phone: [phone] Fax: [phone]  [facility] - [place], [place] - [address] [place] [address] Phone:  [phone] Fax: [phone]     Social Drivers of Health (SDOH) Social History: SDOH Screenings   Food Insecurity: No Food Insecurity (01/04/2024)  Housing: Low Risk  (01/04/2024)  Transportation Needs: No Transportation Needs (01/04/2024)  Utilities: Not At Risk (01/04/2024)  Depression (PHQ2-9): Low Risk  (02/23/2023)  Social Connections: Unknown (01/04/2024)  Tobacco Use: Medium Risk (01/04/2024)   SDOH Interventions:     Readmission Risk Interventions    01/04/2024    1:45 PM 11/17/2023    1:40 PM 10/26/2023   11:45 AM  Readmission Risk Prevention Plan  Transportation Screening Complete Complete Complete  Medication Review (RN Care Manager) Complete Complete   PCP or Specialist appointment within 3-5 days of discharge   Complete  HRI or Home Care Consult Complete Complete Complete  SW Recovery Care/Counseling Consult Complete Complete Complete  Palliative Care Screening Not Applicable Not Applicable Not Applicable  Skilled Nursing Facility Not Applicable Complete Complete

## 2024-01-04 NOTE — H&P (Signed)
 History and Physical    Patient: [name] ZOX:[id number] DOB: [birth year] DOA: 01/03/2024 DOS: the patient was seen and examined on 01/04/2024 PCP: [name], MD  Patient coming from: Home  Chief Complaint:  Chief Complaint  Patient presents with   Altered Mental Status   HPI: [name] is a 89 y.o. male with medical history significant of essential hypertension, hypothyroidism, lung cancer, stage IV CKD, osteoarthritis, urinary retention with indwelling Foley catheter and chronic respiratory failure on home O2 at 2 LPM by  who presents to the emergency department due to altered mental status.  Patient was unable to provide history, history was obtained from EDP and daughter at bedside.  Per report, patient was noted to present with hallucination about 2 weeks ago and this has progressively worsened.  He was recently seen in the ED on 12/26/2023 due to altered mental status, was diagnosed to have acute cystitis with hematuria at that time, IV ceftriaxone was given in the ED and patient was discharged with ciprofloxacin.  Despite this, Patient's confusion continue to worsen so was brought back to the ED for further evaluation and management.  ED Course:  In the emergency department, BP was 166/99, other vital signs are within normal range.  Workup in the ED showed leukopenia, normocytic anemia.  BMP was normal except for BUN/creatinine 55/3.56 (baseline creatinine at 2.3-2.6).  Ammonia 11, alcohol level was undetectable, urine drug screen was normal, UTI was normal.  Influenza A, B, SARS coronavirus 2, RSV was normal. Chest x-ray showed emphysema  with stable asymmetrical density in the right hilus and scarring and distortion in the right upper lung compared to prior. No acute airspace disease CT head without contrast showed no CT evidence for acute intracranial abnormality IV hydration was provided.  Hospitalist was asked to admit patient for further evaluation and  management.  Review of Systems: Review of systems as noted in the HPI. All other systems reviewed and are negative.   Past Medical History:  Diagnosis Date   Arthritis    Hypertension    Hyperthyroidism    lung ca 11/2020   Urinary retention    Past Surgical History:  Procedure Laterality Date   COLONOSCOPY N/A 09/02/2016   Procedure: COLONOSCOPY;  Surgeon: [name], MD;  Location: [facility];  Service: Endoscopy;  Laterality: N/A;  830   NO PAST SURGERIES     POLYPECTOMY  09/02/2016   Procedure: POLYPECTOMY;  Surgeon: [name], MD;  Location: [facility];  Service: Endoscopy;;    Social History:  reports that he quit smoking about 25 years ago. His smoking use included cigarettes. He started smoking about 65 years ago. He has a 60 pack-year smoking history. He has never used smokeless tobacco. He reports that he does not drink alcohol and does not use drugs.   Allergies  Allergen Reactions   Nsaids Other (See Comments)    Decreased renal function    Family History  Problem Relation Age of Onset   Colon cancer Other      Prior to Admission medications   Medication Sig Start Date End Date Taking? Authorizing Provider  acetaminophen (TYLENOL) 325 MG tablet Take 2 tablets (650 mg total) by mouth every 6 (six) hours as needed for mild pain (pain score 1-3), fever or headache (or Fever >/= 101). 11/19/23   Emokpae, Courage, MD  amLODipine (NORVASC) 10 MG tablet Take 1 tablet (10 mg total) by mouth daily. Hold for blood pressure  reading <130 12/08/23   [name], NP  buPROPion (WELLBUTRIN) 100 MG tablet Take 1 tablet (100 mg total) by mouth 2 (two) times daily. 12/08/23   [name], NP  Calcium Carbonate Antacid 200 MG TBDP Take 200 mg by mouth daily.    [provider]  ciprofloxacin (CIPRO) 500 MG tablet Take 1 tablet (500 mg total) by mouth 2 (two) times daily. One po bid x 7 days 12/26/23   Bethann Berkshire, MD  ferrous sulfate 325 (65 FE)  MG EC tablet Take 1 tablet (325 mg total) by mouth every Monday, Wednesday, and Friday. 12/08/23   [name], NP  fluticasone-salmeterol (ADVAIR) 250-50 MCG/ACT AEPB Inhale 1 puff into the lungs in the morning and at bedtime. 12/08/23   [name], NP  furosemide (LASIX) 40 MG tablet Take 1 tablet (40 mg total) by mouth every Monday, Wednesday, and Friday. 12/08/23 12/07/24  [name], NP  gabapentin (NEURONTIN) 250 MG/5ML solution Take 3 mLs (150 mg total) by mouth at bedtime. 12/08/23   [name], NP  labetalol (NORMODYNE) 200 MG tablet Take 1 tablet (200 mg total) by mouth 2 (two) times daily. Hold for blood pressure reading <130 12/08/23   [name], NP  levothyroxine (SYNTHROID) 150 MCG tablet Take 1 tablet (150 mcg total) by mouth daily. 12/08/23   [name], NP  Nutritional Supplements (PROSOURCE) LIQD Take 30 mLs by mouth 3 (three) times daily.    [provider]  polyethylene glycol (MIRALAX / GLYCOLAX) 17 g packet Take 17 g by mouth daily. 10/30/23   [place], Eric J, DO  senna-docusate (SENOKOT-S) 8.6-50 MG tablet Take 2 tablets by mouth at bedtime. 11/19/23   Shon Hale, MD  tamsulosin (FLOMAX) 0.4 MG CAPS capsule Take 1 capsule (0.4 mg total) by mouth daily after supper. 12/08/23   [name], NP  umeclidinium bromide (INCRUSE ELLIPTA) 62.5 MCG/ACT AEPB Inhale 1 puff into the lungs daily. 12/08/23   [name], NP    Physical Exam: BP (!) 162/90 (BP Location: Right Arm)   Pulse 84   Temp (!) 97.5 F (36.4 C) (Oral)   Resp (!) 22   Ht 6\' 2"  (1.88 m)   Wt 60.2 kg   SpO2 99%   BMI 17.04 kg/m   General: 89 y.o. year-old male well chronically ill-appearing, but in no acute distress.  Alert and oriented x 1. HEENT: NCAT, EOMI, dry mucous membrane Neck: Supple, trachea medial Cardiovascular: Regular rate and rhythm with no rubs or gallops.  No thyromegaly or JVD noted.  No lower extremity edema. 2/4 pulses in all 4  extremities. Respiratory: Clear to auscultation with no wheezes or rales. Good inspiratory effort. Abdomen: Soft, nontender nondistended with normal bowel sounds x4 quadrants. Muskuloskeletal: No cyanosis, clubbing or edema noted bilaterally Neuro: CN II-XII intact, strength 5/5 x 4, sensation, reflexes intact Skin: No ulcerative lesions noted or rashes Psychiatry: Judgement and insight appear normal. Mood is appropriate for condition and setting          Labs on Admission:  Basic Metabolic Panel: Recent Labs  Lab 01/03/24 2054  NA 138  K 4.8  CL 105  CO2 22  GLUCOSE 96  BUN 55*  CREATININE 3.56*  CALCIUM 9.2   Liver Function Tests: Recent Labs  Lab 01/03/24 2054  AST 33  ALT 43  ALKPHOS 90  BILITOT 0.7  PROT 7.8  ALBUMIN 3.5   No results for input(s): "  LIPASE", "AMYLASE" in the last 168 hours. Recent Labs  Lab 01/03/24 2054  AMMONIA 11   CBC: Recent Labs  Lab 01/03/24 2054  WBC 3.9*  NEUTROABS 2.3  HGB 11.4*  HCT 36.2*  MCV 94.3  PLT 156   Cardiac Enzymes: No results for input(s): "CKTOTAL", "CKMB", "CKMBINDEX", "TROPONINI" in the last 168 hours.  BNP (last 3 results) Recent Labs    07/07/23 1818  BNP 175.0*    ProBNP (last 3 results) No results for input(s): "PROBNP" in the last 8760 hours.  CBG: Recent Labs  Lab 01/03/24 1727  GLUCAP 85    Radiological Exams on Admission: DG Chest Portable 1 View Result Date: 01/03/2024 CLINICAL DATA:  Cough altered EXAM: PORTABLE CHEST 1 VIEW COMPARISON:  12/26/2023, CT chest 10/25/2023, chest x-ray 07/07/2023, 10/21/2020, 10/25/2023 FINDINGS: Emphysema. Stable distortion and scarring in the right upper lung foot presumably related to post therapeutic change. Asymmetrical density in the right hilus, also without significant change. Stable cardiomediastinal silhouette with aortic atherosclerosis. No pneumothorax IMPRESSION: Emphysema with stable asymmetrical density in the right hilus and scarring and  distortion in the right upper lung compared to prior. No acute airspace disease Electronically Signed   By: Jasmine Pang M.D.   On: 01/03/2024 23:02   CT HEAD WO CONTRAST Result Date: 01/03/2024 CLINICAL DATA:  Altered mental status EXAM: CT HEAD WITHOUT CONTRAST TECHNIQUE: Contiguous axial images were obtained from the base of the skull through the vertex without intravenous contrast. RADIATION DOSE REDUCTION: This exam was performed according to the departmental dose-optimization program which includes automated exposure control, adjustment of the mA and/or kV according to patient size and/or use of iterative reconstruction technique. COMPARISON:  CT brain 11/16/2023, 12/26/2023 FINDINGS: Brain: No acute territorial infarction, hemorrhage or intracranial mass. Atrophy and chronic small vessel ischemic changes of the white matter. Stable ventricle size Vascular: No hyperdense vessels.  Carotid vascular calcification Skull: Normal. Negative for fracture or focal lesion. Sinuses/Orbits: No acute finding. Other: None IMPRESSION: 1. No CT evidence for acute intracranial abnormality. 2. Atrophy and chronic small vessel ischemic changes of the white matter. Electronically Signed   By: Jasmine Pang M.D.   On: 01/03/2024 22:59    EKG: I independently viewed the EKG done and my findings are as followed: EKG was not done in the ED  Assessment/Plan Present on Admission:  Acute kidney injury superimposed on stage 4 chronic kidney disease (HCC)  Dehydration  Acute metabolic encephalopathy  Failure to thrive in adult  Urinary retention  Essential hypertension  Acquired hypothyroidism  Active Problems:   Essential hypertension   Acquired hypothyroidism   Acute metabolic encephalopathy   Dehydration   Failure to thrive in adult   Urinary retention   Acute kidney injury superimposed on stage 4 chronic kidney disease (HCC)   Leukopenia   Acute kidney injury superimposed on stage IV chronic kidney  disease Dehydration BUN/creatinine 55/3.56 (baseline creatinine at 2.3-2.6).   Continue gentle hydration Renally adjust medications, avoid nephrotoxic agents/dehydration/hypotension  Acute metabolic encephalopathy in the setting of above Continue management as described above Continue fall precaution  Leukopenia WBC 3.9, this was 2.5 on 12/26/2023 Continue to monitor WBC with morning labs  Failure to thrive in adult Underweight (BMI 16.39) Protein supplement to be provided Dietitian will be consulted and we shall await further recommendation  Urinary retention Patient has Foley catheter Patient will follow-up with outpatient urology  Essential hypertension Continue amlodipine, labetalol  Acquired hypothyroidism Continue Synthroid   DVT prophylaxis: Subcu heparin  Code Status: DNR  Family Communication: Daughter at bedside (all questions answered to satisfaction)  Consults: None  SSeverity of Illness: The appropriate patient status for this patient is INPATIENT. Inpatient status is judged to be reasonable and necessary in order to provide the required intensity of service to ensure the patient's safety. The patient's presenting symptoms, physical exam findings, and initial radiographic and laboratory data in the context of their chronic comorbidities is felt to place them at high risk for further clinical deterioration. Furthermore, it is not anticipated that the patient will be medically stable for discharge from the hospital within 2 midnights of admission.   * I certify that at the point of admission it is my clinical judgment that the patient will require inpatient hospital care spanning beyond 2 midnights from the point of admission due to high intensity of service, high risk for further deterioration and high frequency of surveillance required.*  Author: Frankey Shown, DO 01/04/2024 6:41 AM  For on call review [email or web address].

## 2024-01-05 DIAGNOSIS — N179 Acute kidney failure, unspecified: Secondary | ICD-10-CM | POA: Diagnosis not present

## 2024-01-05 DIAGNOSIS — F03A0 Unspecified dementia, mild, without behavioral disturbance, psychotic disturbance, mood disturbance, and anxiety: Secondary | ICD-10-CM | POA: Diagnosis not present

## 2024-01-05 DIAGNOSIS — R531 Weakness: Secondary | ICD-10-CM

## 2024-01-05 DIAGNOSIS — R627 Adult failure to thrive: Secondary | ICD-10-CM | POA: Diagnosis not present

## 2024-01-05 LAB — COMPREHENSIVE METABOLIC PANEL WITH GFR
ALT: 28 U/L (ref 0–44)
AST: 19 U/L (ref 15–41)
Albumin: 2.7 g/dL — ABNORMAL LOW (ref 3.5–5.0)
Alkaline Phosphatase: 75 U/L (ref 38–126)
Anion gap: 8 (ref 5–15)
BUN: 49 mg/dL — ABNORMAL HIGH (ref 8–23)
CO2: 22 mmol/L (ref 22–32)
Calcium: 8.4 mg/dL — ABNORMAL LOW (ref 8.9–10.3)
Chloride: 109 mmol/L (ref 98–111)
Creatinine, Ser: 2.94 mg/dL — ABNORMAL HIGH (ref 0.61–1.24)
GFR, Estimated: 20 mL/min — ABNORMAL LOW (ref >60)
Glucose, Bld: 70 mg/dL (ref 70–99)
Potassium: 4.4 mmol/L (ref 3.5–5.1)
Sodium: 139 mmol/L (ref 135–145)
Total Bilirubin: 0.6 mg/dL (ref 0.0–1.2)
Total Protein: 6.2 g/dL — ABNORMAL LOW (ref 6.5–8.1)

## 2024-01-05 LAB — URINE CULTURE
Culture: NO GROWTH
Culture: NO GROWTH

## 2024-01-05 LAB — CBC
HCT: 30.8 % — ABNORMAL LOW (ref 39.0–52.0)
Hemoglobin: 9.4 g/dL — ABNORMAL LOW (ref 13.0–17.0)
MCH: 28.7 pg (ref 26.0–34.0)
MCHC: 30.5 g/dL (ref 30.0–36.0)
MCV: 93.9 fL (ref 80.0–100.0)
Platelets: 152 10*3/uL (ref 150–400)
RBC: 3.28 MIL/uL — ABNORMAL LOW (ref 4.22–5.81)
RDW: 16.2 % — ABNORMAL HIGH (ref 11.5–15.5)
WBC: 2.7 10*3/uL — ABNORMAL LOW (ref 4.0–10.5)
nRBC: 0 % (ref 0.0–0.2)

## 2024-01-05 LAB — MAGNESIUM: Magnesium: 2 mg/dL (ref 1.7–2.4)

## 2024-01-05 LAB — PHOSPHORUS: Phosphorus: 3.2 mg/dL (ref 2.5–4.6)

## 2024-01-05 NOTE — Plan of Care (Signed)

## 2024-01-05 NOTE — Plan of Care (Signed)

## 2024-01-05 NOTE — Progress Notes (Signed)
 [facility]  PROGRESS NOTE  FENTON CANDEE ZOX:[id number] DOB: 07-Jun-1933 DOA: 01/03/2024 PCP: [name], MD   Brief HPI:   89 y.o. M with dementia lives at home, wheelchair bound, lung CA metastatic to LN and right chest wall, chronic respiratory failure on 2L home O2, CKD IV baseline 2.4, chronic urinary retention with indwelling foley, HTN, and hypothyroidism who presented with AMS.   In the ER, Cr back up to 3.5. CTH normal. Admitted on fluids.    Assessment/Plan:    Acute kidney injury superimposed on stage 4 chronic kidney disease (HCC) Baseline Cr appears to be in the 2.2-2.6 range in the last year.    -Creatinine has improved to 2.94       Active Problems:   Generalized weakness   Acute metabolic encephalopathy At baseline he is responsive to questions, conversational.   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  At baseline, patient lives alone, requires assistance to get out of bed and transfer to toilet/wheelchair, and cannot walk.     Family check on him frequently and provide him food.  He has dementia they know, but recognizes them and they report he is oriented to place and time usually (notes from Dr[facility] Alwyn Ren at [facility] over the last 4 months describe an individual who typically is not aware of his surroundings, or recent events, not a reliable historian, and so I am unable to reconcile the discrepancy at present).   He has lost 20% body weight since last 8 months (15 kg) and now has BMI of 17 and clearly is not eating enough due to his dementia and cancer, despite loving attention by family.   Given his pattern of now fourth readmission in 3 months, if there is not a readily  identifiable cause of his current condition (ie a stroke or bacteremia), Dr. Maryfrances Bunnell  told daughter/POA [name] that I suspect this is his body failing to thrive in the setting of metastatic cancer, dementia, weight loss and advanced renal failure and I would recommend either home with hospice or long term care placement with palliative care following. - Palliative care consulted       Anemia of chronic disease   Essential hypertension   Hypothyroidism   Chronic respiratory failure with hypoxia (HCC)   Chronic urinary retention   - Continue amlodipine, Breo, Ensure, Labetalol, levothyroxine, Seroquel, Flomax, Incruse   Medications     amLODipine  10 mg Oral Daily   buPROPion  100 mg Oral BID   Chlorhexidine Gluconate Cloth  6 each Topical Q0600   feeding supplement  237 mL Oral TID BM   ferrous sulfate  325 mg Oral Q M,W,F   fluticasone furoate-vilanterol  1 puff Inhalation Daily   gabapentin  150 mg Oral QHS   heparin  5,000 Units Subcutaneous Q8H   labetalol  200 mg Oral BID   levothyroxine  150 mcg Oral Daily   polyethylene glycol  17 g Oral Daily   QUEtiapine  25 mg Oral QHS   tamsulosin  0.4 mg Oral QPC supper   umeclidinium bromide  1 puff Inhalation Daily     Data Reviewed:   CBG:  Recent Labs  Lab 01/03/24  1727  GLUCAP 85    SpO2: 97 % O2 Flow Rate (L/min): 2 L/min    Vitals:   01/04/24 2055 01/05/24 0530 01/05/24 0739 01/05/24 0905  BP: (!) 158/88 136/72  138/72  Pulse: 70 66  63  Resp: 15 18  16   Temp: 98.1 F (36.7 C) 98 F (36.7 C)    TempSrc: Oral Oral    SpO2: 100% 98% 97%   Weight:      Height:          Data Reviewed:  Basic Metabolic Panel: Recent Labs  Lab 01/03/24 2054 01/05/24 0420  NA 138 139  K 4.8 4.4  CL 105 109  CO2 22 22  GLUCOSE 96 70  BUN 55* 49*  CREATININE 3.56* 2.94*  CALCIUM 9.2 8.4*  MG  --  2.0  PHOS  --  3.2    CBC: Recent Labs  Lab 01/03/24 2054 01/05/24 0420  WBC 3.9* 2.7*  NEUTROABS 2.3  --    HGB 11.4* 9.4*  HCT 36.2* 30.8*  MCV 94.3 93.9  PLT 156 152    LFT Recent Labs  Lab 01/03/24 2054 01/05/24 0420  AST 33 19  ALT 43 28  ALKPHOS 90 75  BILITOT 0.7 0.6  PROT 7.8 6.2*  ALBUMIN 3.5 2.7*     Antibiotics: Anti-infectives (From admission, onward)    Start     Dose/Rate Route Frequency Ordered Stop   01/04/24 0945  cefTRIAXone (ROCEPHIN) 1 g in sodium chloride 0.9 % 100 mL IVPB        1 g 200 mL/hr over 30 Minutes Intravenous Every 24 hours 01/04/24 0854          DVT prophylaxis: SCDs  Code Status: DNR  Family Communication:    CONSULTS    Subjective   Denies any complaints   Objective    Physical Examination:   General-appears in no acute distress Heart-S1-S2, regular, no murmur auscultated Lungs-clear to auscultation bilaterally, no wheezing or crackles auscultated Abdomen-soft, nontender, no organomegaly Extremities-no edema in the lower extremities Neuro-alert, eyes open, answering questions with yes and no   Status is: Inpatient:             [name]   [facility] If 7PM-7AM, please contact night-coverage at [email or web address], Office  [phone]   01/05/2024, 10:46 AM  LOS: 2 days

## 2024-01-05 NOTE — Progress Notes (Signed)
 Patients IVF order has expired. Dr. Thomes Dinning paged and stated to wait until labs resulted this morning to see if they need reordered. Will continue to monitor.

## 2024-01-06 ENCOUNTER — Encounter (HOSPITAL_COMMUNITY): Payer: Self-pay | Admitting: Internal Medicine

## 2024-01-06 ENCOUNTER — Ambulatory Visit (HOSPITAL_COMMUNITY): Admission: RE | Admit: 2024-01-06 | Source: Ambulatory Visit

## 2024-01-06 DIAGNOSIS — R531 Weakness: Secondary | ICD-10-CM | POA: Diagnosis not present

## 2024-01-06 DIAGNOSIS — Z7189 Other specified counseling: Secondary | ICD-10-CM

## 2024-01-06 DIAGNOSIS — R627 Adult failure to thrive: Secondary | ICD-10-CM | POA: Diagnosis not present

## 2024-01-06 DIAGNOSIS — F03918 Unspecified dementia, unspecified severity, with other behavioral disturbance: Secondary | ICD-10-CM

## 2024-01-06 DIAGNOSIS — N179 Acute kidney failure, unspecified: Secondary | ICD-10-CM | POA: Diagnosis not present

## 2024-01-06 DIAGNOSIS — Z515 Encounter for palliative care: Secondary | ICD-10-CM

## 2024-01-06 DIAGNOSIS — F03A0 Unspecified dementia, mild, without behavioral disturbance, psychotic disturbance, mood disturbance, and anxiety: Secondary | ICD-10-CM | POA: Diagnosis not present

## 2024-01-06 MED ORDER — DIAZEPAM 5 MG/ML IJ SOLN
1.2500 mg | Freq: Once | INTRAMUSCULAR | Status: AC
  Administered 2024-01-06: 1.25 mg via INTRAVENOUS
  Filled 2024-01-06: qty 2

## 2024-01-06 NOTE — Evaluation (Addendum)
 Physical Therapy Evaluation Patient Details Name: [name] MRN: [id number] DOB: [birth year] Today's Date: 01/06/2024  History of Present Illness  [name] is a 89 y.o. male with medical history significant of essential hypertension, hypothyroidism, lung cancer, stage IV CKD, osteoarthritis, urinary retention with indwelling Foley catheter and chronic respiratory failure on home O2 at 2 LPM by [place] who presents to the emergency department due to altered mental status.  Patient was unable to provide history, history was obtained from EDP and daughter at bedside.  Per report, patient was noted to present with hallucination about 2 weeks ago and this has progressively worsened.  He was recently seen in the ED on 12/26/2023 due to altered mental status, was diagnosed to have acute cystitis with hematuria at that time, IV ceftriaxone was given in the ED and patient was discharged with ciprofloxacin.  Despite this, Patient's confusion continue to worsen so was brought back to the ED for further evaluation and management.   Clinical Impression  Patient tolerated session well despite reports of RLE pain on this date. Patient is only oriented to self during evaluation. History pulled from Hospitalist and social work note and At baseline, patient is w/c or bed bound, requiring assist from daughters for transfers and unable to walk. However, patient reporting he walked yesterday. On this date, patient needs min A to roll R/L, and mod A for supine<>sit and max A for STS, unable to achieve full stand 2/2 weakness. Patient demonstrates impairments in cognition, general weakness, impaired seated balance, and decreased activity tolerance/endurance. Patient is currently at his baseline with mobility but would benefit from continued skilled physical therapy in recommended venue in order to address the above as patient is unable to return to SNF, per family and SW. Patient discharged to care of nursing for  ambulation daily as tolerated for length of stay.        If plan is discharge home, recommend the following: A lot of help with bathing/dressing/bathroom;A lot of help with walking and/or transfers;Assist for transportation;Supervision due to cognitive status;Direct supervision/assist for medications management;Assistance with cooking/housework   Can travel by private vehicle        Equipment Recommendations None recommended by PT  Recommendations for Other Services       Functional Status Assessment Patient has had a recent decline in their functional status and/or demonstrates limited ability to make significant improvements in function in a reasonable and predictable amount of time     Precautions / Restrictions Precautions Precautions: Fall Recall of Precautions/Restrictions: Impaired Restrictions Weight Bearing Restrictions Per Provider Order: No      Mobility  Bed Mobility Overal bed mobility: Needs Assistance Bed Mobility: Rolling, Supine to Sit, Sit to Supine Rolling: Min assist   Supine to sit: Mod assist, Min assist Sit to supine: Min assist, Mod assist   General bed mobility comments: HOB flat. Min-mod A for trunk control    Transfers Overall transfer level: Needs assistance   Transfers: Sit to/from Stand Sit to Stand: Max assist     General transfer comment: Max A for STS. Unable to fully WB through LEs 2/2 weakness. Achieved 75% of full stand..    Ambulation/Gait   General Gait Details: Unable to assess on this date. Safety concerns  Stairs      Wheelchair Mobility     Tilt Bed    Modified Rankin (Stroke Patients Only)       Balance Overall balance assessment: Needs assistance Sitting-balance support: No upper extremity supported,  Feet supported Sitting balance-Leahy Scale: Fair Sitting balance - Comments: Posterior lean evident when lifting LE seated EOB.       Standing balance comment: Unable to assess on this date. Safety  concerns         Pertinent Vitals/Pain Pain Assessment Pain Assessment: 0-10 Pain Score: 9  Pain Location: RLE Pain Descriptors / Indicators: Aching Pain Intervention(s): Limited activity within patient's tolerance, Monitored during session, Repositioned    Home Living Family/patient expects to be discharged to:: Private residence Living Arrangements: Alone;Children (Patient reports living alone but daughter(S) coming to assist him) Available Help at Discharge: Available PRN/intermittently;Family Type of Home: House Home Access: Level entry       Home Layout: One level Home Equipment: Grab bars - tub/shower;Cane - single Librarian, academic (2 wheels);Shower seat      Prior Function Prior Level of Function : Needs assist       Physical Assist : ADLs (physical);Mobility (physical) Mobility (physical): Bed mobility;Transfers ADLs (physical): Bathing;Toileting;Dressing;IADLs Mobility Comments: Pt poor historian. Reports ambulation yesterday. Per Hospitalist note, daughters report pt as w/c or bed bound, requiring assist for transfers and unable to walk ADLs Comments: Per Hospitalist and SW note, patient requires assist for ADLs.     Extremity/Trunk Assessment   Upper Extremity Assessment Upper Extremity Assessment: Generalized weakness (Grossly 3+/4- t/o)    Lower Extremity Assessment Lower Extremity Assessment: Generalized weakness (Grossly 3+/4- t/o)    Cervical / Trunk Assessment Cervical / Trunk Assessment: Kyphotic  Communication   Communication Communication: No apparent difficulties (Low/slurred speech at times)    Cognition Arousal: Lethargic Behavior During Therapy: WFL for tasks assessed/performed         PT - Cognition Comments: Oriented to person only. Following commands: Intact       Cueing Cueing Techniques: Verbal cues     General Comments      Exercises     Assessment/Plan    PT Assessment All further PT needs can be met in the  next venue of care  PT Problem List Decreased cognition;Decreased strength;Decreased range of motion;Decreased activity tolerance;Decreased balance;Decreased mobility;Pain       PT Treatment Interventions Functional mobility training;Therapeutic activities;Therapeutic exercise;Patient/family education;Gait training    PT Goals (Current goals can be found in the Care Plan section)  Acute Rehab PT Goals Patient Stated Goal: Return home with Home health services to improve QOL. PT Goal Formulation: With patient Time For Goal Achievement: 01/13/24 Potential to Achieve Goals: Fair    Frequency       Co-evaluation               AM-PAC PT "6 Clicks" Mobility  Outcome Measure Help needed turning from your back to your side while in a flat bed without using bedrails?: A Little Help needed moving from lying on your back to sitting on the side of a flat bed without using bedrails?: A Lot Help needed moving to and from a bed to a chair (including a wheelchair)?: A Lot Help needed standing up from a chair using your arms (e.g., wheelchair or bedside chair)?: A Lot Help needed to walk in hospital room?: A Lot Help needed climbing 3-5 steps with a railing? : Total 6 Click Score: 12    End of Session Equipment Utilized During Treatment: Gait belt Activity Tolerance: Patient tolerated treatment well Patient left: in bed;with call bell/phone within reach;with bed alarm set;with restraints reapplied   PT Visit Diagnosis: Pain;Other abnormalities of gait and mobility (R26.89);Muscle weakness (generalized) (M62.81);Adult, failure  to thrive (R62.7) Pain - Right/Left: Right Pain - part of body: Leg    Time: 0910-0926 PT Time Calculation (min) (ACUTE ONLY): 16 min   Charges:   PT Evaluation $PT Eval Low Complexity: 1 Low PT Treatments $Therapeutic Activity: 8-22 mins PT General Charges $$ ACUTE PT VISIT: 1 Visit         11:29 AM, 01/06/24 Chryl Heck, PT, DPT [facility]

## 2024-01-06 NOTE — TOC Progression Note (Signed)
 Transition of Care [facility]) - Progression Note    Patient Details  Name: [name] MRN: [id number] Date of Birth: [birth year]  [facility] CM/SW Contact  Leitha Bleak, RN Phone Number: 01/06/2024, 12:06 PM  Clinical Narrative:    Palliative consulted TOC to send referral to [facility] for home with hospice. Discussion with [name] who is HCPOA. She will need to speak with her sister to decide where the patient will go. Possibly Sharon's house. Referral sent to [facility], she added Shawn to follow. CM also texted Morrie Sheldon she is here today to follow up and assist the family with a plan to discharge home.     Expected Discharge Plan: Home w Hospice Care Barriers to Discharge:  (Hospice to assess)  Expected Discharge Plan and Services In-house Referral: Clinical Social Work   Post Acute Care Choice: Home Health, Resumption of Svcs/PTA Provider Living arrangements for the past 2 months: Single Family Home                      HH Agency:  [facility]) Date [facility] Agency Contacted: 01/06/24 Time HH Agency Contacted: 1153 Representative spoke with at [facility] Agency: Gardiner Ramus, Shawn   Social Determinants of Health (SDOH) Interventions SDOH Screenings   Food Insecurity: No Food Insecurity (01/04/2024)  Housing: Low Risk  (01/04/2024)  Transportation Needs: No Transportation Needs (01/04/2024)  Utilities: Not At Risk (01/04/2024)  Depression (PHQ2-9): Low Risk  (02/23/2023)  Social Connections: Unknown (01/04/2024)  Tobacco Use: Medium Risk (01/06/2024)    Readmission Risk Interventions    01/04/2024    1:45 PM 11/17/2023    1:40 PM 10/26/2023   11:45 AM  Readmission Risk Prevention Plan  Transportation Screening Complete Complete Complete  Medication Review Oceanographer) Complete Complete   PCP or Specialist appointment within 3-5 days of discharge   Complete  HRI or Home Care Consult Complete Complete Complete  SW Recovery Care/Counseling Consult Complete Complete  Complete  Palliative Care Screening Not Applicable Not Applicable Not Applicable  Skilled Nursing Facility Not Applicable Complete Complete

## 2024-01-06 NOTE — Progress Notes (Signed)
 [facility]  PROGRESS NOTE  [name] OZH:[id number] DOB: October 21, 1932 DOA: 01/03/2024 PCP: [name], MD   Brief HPI:   89 y.o. M with dementia lives at home, wheelchair bound, lung CA metastatic to LN and right chest wall, chronic respiratory failure on 2L home O2, CKD IV baseline 2.4, chronic urinary retention with indwelling foley, HTN, and hypothyroidism who presented with AMS.   In the ER, Cr back up to 3.5. CTH normal. Admitted on fluids.    Assessment/Plan:    Acute kidney injury superimposed on stage 4 chronic kidney disease (HCC) Baseline Cr appears to be in the 2.2-2.6 range in the last year.    -Creatinine has improved to 2.94       Active Problems:   Generalized weakness   Acute metabolic encephalopathy At baseline he is responsive to questions, conversational.   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  At baseline, patient lives alone, requires assistance to get out of bed and transfer to toilet/wheelchair, and cannot walk.     Family check on him frequently and provide him food.  He has dementia they know, but recognizes them and they report he is oriented to place and time usually (notes from Dr. Alwyn Ren at [facility] over the last 4 months describe an individual who typically is not aware of his surroundings, or recent events, not a reliable historian, and so I am unable to reconcile the discrepancy at present).   He has lost 20% body weight since last 8 months (15 kg) and now has BMI of 17 and clearly is not eating enough due to his dementia and cancer, despite loving attention by family.   Given his pattern of now fourth readmission in 3 months, if there is not a readily  identifiable cause of his current condition (ie a stroke or bacteremia), Dr. Maryfrances Bunnell  told daughter/POA [name] that I suspect this is his body failing to thrive in the setting of metastatic cancer, dementia, weight loss and advanced renal failure and I would recommend either home with hospice or long term care placement with palliative care following. - Palliative care consulted       Anemia of chronic disease   Essential hypertension   Hypothyroidism   Chronic respiratory failure with hypoxia (HCC)   Chronic urinary retention   - Continue amlodipine, Breo, Ensure, Labetalol, levothyroxine, Seroquel, Flomax, Incruse   Medications     amLODipine  10 mg Oral Daily   buPROPion  100 mg Oral BID   Chlorhexidine Gluconate Cloth  6 each Topical Q0600   feeding supplement  237 mL Oral TID BM   ferrous sulfate  325 mg Oral Q M,W,F   fluticasone furoate-vilanterol  1 puff Inhalation Daily   gabapentin  150 mg Oral QHS   heparin  5,000 Units Subcutaneous Q8H   labetalol  200 mg Oral BID   levothyroxine  150 mcg Oral Daily   polyethylene glycol  17 g Oral Daily   QUEtiapine  25 mg Oral QHS   tamsulosin  0.4 mg Oral QPC supper   umeclidinium bromide  1 puff Inhalation Daily     Data Reviewed:   CBG:  Recent Labs  Lab 01/03/24  1727  GLUCAP 85    SpO2: 100 % O2 Flow Rate (L/min): 2 L/min    Vitals:   01/05/24 0905 01/05/24 1320 01/05/24 2056 01/06/24 0525  BP: 138/72 120/66 129/73 (!) 159/82  Pulse: 63 (!) 59 (!) 58 65  Resp: 16 16 18 18   Temp:  97.9 F (36.6 C) 98.5 F (36.9 C) 97.6 F (36.4 C)  TempSrc:  Oral Oral Oral  SpO2:  100% 100% 100%  Weight:      Height:          Data Reviewed:  Basic Metabolic Panel: Recent Labs  Lab 01/03/24 2054 01/05/24 0420  NA 138 139  K 4.8 4.4  CL 105 109  CO2 22 22  GLUCOSE 96 70  BUN 55* 49*  CREATININE 3.56* 2.94*  CALCIUM 9.2 8.4*  MG  --  2.0  PHOS  --  3.2    CBC: Recent Labs  Lab 01/03/24 2054  01/05/24 0420  WBC 3.9* 2.7*  NEUTROABS 2.3  --   HGB 11.4* 9.4*  HCT 36.2* 30.8*  MCV 94.3 93.9  PLT 156 152    LFT Recent Labs  Lab 01/03/24 2054 01/05/24 0420  AST 33 19  ALT 43 28  ALKPHOS 90 75  BILITOT 0.7 0.6  PROT 7.8 6.2*  ALBUMIN 3.5 2.7*     Antibiotics: Anti-infectives (From admission, onward)    Start     Dose/Rate Route Frequency Ordered Stop   01/04/24 0945  cefTRIAXone (ROCEPHIN) 1 g in sodium chloride 0.9 % 100 mL IVPB        1 g 200 mL/hr over 30 Minutes Intravenous Every 24 hours 01/04/24 0854          DVT prophylaxis: SCDs  Code Status: DNR  Family Communication:    CONSULTS    Subjective    No new complaints  Objective    Physical Examination:  Appears in no acute distress Alert, communicating Heart S1-S2, regular   Status is: Inpatient:             [name]   [facility] If 7PM-7AM, please contact night-coverage at [email or web address], Office  [phone]   01/06/2024, 8:15 AM  LOS: 3 days

## 2024-01-06 NOTE — Consult Note (Signed)
 Consultation Note Date: 01/06/2024   Patient Name: [name]  DOB: October 11, 1932  MRN: [id number]  Age / Sex: 89 y.o., male  PCP: [name], MD Referring Physician: [name], MD  Reason for Consultation: Establishing goals of care  HPI/Patient Profile: 89 y.o. male  with past medical history of dementia lives at home, wheelchair bound, lung CA metastatic to LN and right chest wall, chronic respiratory failure on 2L home O2, CKD IV baseline 2.4, chronic urinary retention with indwelling foley, HTN, and hypothyroidism  admitted on 01/03/2024 with AKI on CKD, failure to thrive and dementia with non-small cell lung cancer with metastatic burden.   Clinical Assessment and Goals of Care: I have reviewed medical records including EPIC notes, labs and imaging, received report from RN, assessed the patient.  [name] is sitting up in the bed.  He appears chronically ill and somewhat frail.  He will briefly make but not keep eye contact.  He has known dementia, but is able to tell me his name.  He cannot tell me where we are states that we are at physical therapy.  He is independently peeling a banana which he feeds himself.  I believe that he can make his basic needs known.  There is no family at bedside at this time.  Call to daughter, [name].to discuss diagnosis prognosis, GOC, EOL wishes, disposition and options.  I introduced Palliative Medicine as specialized medical care for people living with serious illness. It focuses on providing relief from the symptoms and stress of a serious illness. The goal is to improve quality of life for both the patient and the family.  We discussed a brief life review of the patient.  [name] rents his home, and has Medicaid.  Daughters [name] and [name] live locally and help provide care, at times they are able to spend the night.  Son [name] also lives locally.  He  has 2 or 3 children who live out of state.  They state that [name] cannot maintain his ADLs.    We then focused on their current illness.  We talk in detail about memory loss and failure to thrive.  We talked about 5 hospital stays in the last 6 months.  We talked about reversible problems, reviewing creatinine, MRI, urine culture, treatment plan, lung cancer with metastatic burden, dementia.   We talked about acute on chronic kidney injury.  We talked about the chronic illness trajectory, what is normal and expected.  We talked about the changes that are expected with progressive memory loss.  The natural disease trajectory and expectations at EOL were discussed.  I attempted to elicit values and goals of care important to the patient.  The difference between aggressive medical intervention and comfort care was considered in light of the patient's goals of care.  We talked about prognosis with permission.  I shared that it would be anticipated [name] would be sick again within 1 month.  We talked about how to make choices for loved  ones including 1) keeping them at the center of decision-making 2) are we doing something for him or to him, and 3) the person he was 10 years ago, how would that man tell family to care for him now.  Advanced directives, concepts specific to code status, artifical feeding and hydration, and rehospitalization were considered and discussed.  DNR verified.  We also talked about the concept of "let nature take its course".  I shared that it is not illegal, nor unethical, but some people have a more a problem with not treating.  Hospice Care services outpatient were explained and offered.  We talk in detail about the benefits of hospice care both in-home and in residential hospice.  We talked about provider choice, they choose [facility].  Discussed the importance of continued conversation with family and the medical providers regarding overall plan of care and treatment  options, ensuring decisions are within the context of the patient's values and GOCs.  Questions and concerns were addressed.  Hard Choices booklet left for review. The family was encouraged to call with questions or concerns.  PMT will continue to support holistically.  Conference with attending, bedside nursing staff, transition of care team related to patient condition, needs, goals of care, disposition.    HCPOA  HCPOA - [name] tells me that she is legal healthcare power of attorney.  She states that [name] raised [name] and [name] as his own children and she also has (2?) siblings who live out of state.  She tells me that all siblings share that they will support her and what ever her choice.    SUMMARY OF RECOMMENDATIONS   Considering home with [facility] hospice VS  Long-term care with [facility] hospice    Code Status/Advance Care Planning: DNR  Symptom Management:  Per hospitalist, no additional needs at this time.  Palliative Prophylaxis:  Frequent Pain Assessment and Oral Care  Additional Recommendations (Limitations, Scope, Preferences): Requesting hospice care with anticipated transition to comfort care within 1 month  Psycho-social/Spiritual:  Desire for further Chaplaincy support:no Additional Recommendations: Caregiving  Support/Resources and Education on Hospice  Prognosis:  < 6 weeks would be anticipated based on advancing dementia, advancing/untreated lung cancer, 20% weight loss in the last 8 months with a BMI of 17, chronic illness burden, wheelchair-bound, 5 hospital admits and 3 ED visits in the last 6 months  Discharge Planning: To Be Determined      Primary Diagnoses: Present on Admission:  Acute kidney injury superimposed on stage 4 chronic kidney disease (HCC)  Acute metabolic encephalopathy  Failure to thrive in adult  Chronic urinary retention  Essential hypertension  Hypothyroidism  Anemia of chronic disease  Chronic respiratory  failure with hypoxia (HCC)  Non-small cell lung cancer metastatic to lymph node and chest wall (HCC)   I have reviewed the medical record, interviewed the patient and family, and examined the patient. The following aspects are pertinent.  Past Medical History:  Diagnosis Date   Arthritis    Hypertension    Hyperthyroidism    lung ca 11/2020   Urinary retention    Social History   Socioeconomic History   Marital status: Divorced    Spouse name: Not on file   Number of children: Not on file   Years of education: Not on file   Highest education level: Not on file  Occupational History   Not on file  Tobacco Use   Smoking status: Former    Current packs/day: 0.00    Average  packs/day: 1.5 packs/day for 40.0 years (60.0 ttl pk-yrs)    Types: Cigarettes    Start date: 60    Quit date: 2000    Years since quitting: 25.2   Smokeless tobacco: Never  Vaping Use   Vaping status: Never Used  Substance and Sexual Activity   Alcohol use: No   Drug use: No   Sexual activity: Not on file  Other Topics Concern   Not on file  Social History Narrative   Not on file   Social Drivers of Health   Financial Resource Strain: Not on file  Food Insecurity: No Food Insecurity (01/04/2024)   Hunger Vital Sign    Worried About Running Out of Food in the Last Year: Never true    Ran Out of Food in the Last Year: Never true  Transportation Needs: No Transportation Needs (01/04/2024)   PRAPARE - Administrator, Civil Service (Medical): No    Lack of Transportation (Non-Medical): No  Physical Activity: Not on file  Stress: Not on file  Social Connections: Unknown (01/04/2024)   Social Connection and Isolation Panel [NHANES]    Frequency of Communication with Friends and Family: More than three times a week    Frequency of Social Gatherings with Friends and Family: More than three times a week    Attends Religious Services: Patient declined    Database administrator or  Organizations: No    Attends Engineer, structural: Never    Marital Status: Patient declined   Family History  Problem Relation Age of Onset   Colon cancer Other    Scheduled Meds:  amLODipine  10 mg Oral Daily   buPROPion  100 mg Oral BID   Chlorhexidine Gluconate Cloth  6 each Topical Q0600   feeding supplement  237 mL Oral TID BM   ferrous sulfate  325 mg Oral Q M,W,F   fluticasone furoate-vilanterol  1 puff Inhalation Daily   gabapentin  150 mg Oral QHS   heparin  5,000 Units Subcutaneous Q8H   labetalol  200 mg Oral BID   levothyroxine  150 mcg Oral Daily   polyethylene glycol  17 g Oral Daily   QUEtiapine  25 mg Oral QHS   tamsulosin  0.4 mg Oral QPC supper   umeclidinium bromide  1 puff Inhalation Daily   Continuous Infusions:  cefTRIAXone (ROCEPHIN)  IV 1 g (01/05/24 0853)   PRN Meds:.acetaminophen **OR** acetaminophen, hydrALAZINE, labetalol, prochlorperazine Medications Prior to Admission:  Prior to Admission medications   Medication Sig Start Date End Date Taking? Authorizing Provider  acetaminophen (TYLENOL) 325 MG tablet Take 2 tablets (650 mg total) by mouth every 6 (six) hours as needed for mild pain (pain score 1-3), fever or headache (or Fever >/= 101). 11/19/23  Yes Emokpae, Courage, MD  amLODipine (NORVASC) 10 MG tablet Take 1 tablet (10 mg total) by mouth daily. Hold for blood pressure reading <130 12/08/23  Yes Green, [name], NP  BREO ELLIPTA 100-25 MCG/ACT AEPB Inhale 1 puff into the lungs daily. 12/21/23  Yes [provider]  buPROPion (WELLBUTRIN) 100 MG tablet Take 1 tablet (100 mg total) by mouth 2 (two) times daily. 12/08/23  Yes [name], NP  Calcium Carbonate Antacid 200 MG TBDP Take 200 mg by mouth daily.   Yes [provider]  ferrous sulfate 325 (65 FE) MG EC tablet Take 1 tablet (325 mg total) by mouth every Monday, Wednesday, and Friday. 12/08/23  Yes Green,  [name], NP  furosemide (LASIX) 40 MG tablet Take 1  tablet (40 mg total) by mouth every Monday, Wednesday, and Friday. 12/08/23 12/07/24 Yes [name], NP  gabapentin (NEURONTIN) 250 MG/5ML solution Take 3 mLs (150 mg total) by mouth at bedtime. 12/08/23  Yes [name], NP  labetalol (NORMODYNE) 200 MG tablet Take 1 tablet (200 mg total) by mouth 2 (two) times daily. Hold for blood pressure reading <130 12/08/23  Yes [name], NP  levothyroxine (SYNTHROID) 150 MCG tablet Take 1 tablet (150 mcg total) by mouth daily. 12/08/23  Yes [name], NP  Nutritional Supplements (PROSOURCE) LIQD Take 30 mLs by mouth 3 (three) times daily.   Yes [provider]  polyethylene glycol (MIRALAX / GLYCOLAX) 17 g packet Take 17 g by mouth daily. 10/30/23  Yes [place], Eric J, DO  senna-docusate (SENOKOT-S) 8.6-50 MG tablet Take 2 tablets by mouth at bedtime. 11/19/23  Yes Emokpae, Courage, MD  tamsulosin (FLOMAX) 0.4 MG CAPS capsule Take 1 capsule (0.4 mg total) by mouth daily after supper. 12/08/23  Yes [name], NP  umeclidinium bromide (INCRUSE ELLIPTA) 62.5 MCG/ACT AEPB Inhale 1 puff into the lungs daily. 12/08/23  Yes [name], NP   Allergies  Allergen Reactions   Nsaids Other (See Comments)    Decreased renal function   Review of Systems  Unable to perform ROS: Age    Physical Exam Vitals and nursing note reviewed.     Vital Signs: BP (!) 159/82   Pulse 65   Temp 97.6 F (36.4 C) (Oral)   Resp 18   Ht 6\' 2"  (1.88 m)   Wt 60.2 kg   SpO2 100%   BMI 17.04 kg/m  Pain Scale: Faces   Pain Score: 0-No pain   SpO2: SpO2: 100 % O2 Device:SpO2: 100 % O2 Flow Rate: .O2 Flow Rate (L/min): 2 L/min  IO: Intake/output summary:  Intake/Output Summary (Last 24 hours) at 01/06/2024 0911 Last data filed at 01/06/2024 0500 Gross per 24 hour  Intake 240 ml  Output 800 ml  Net -560 ml    LBM: Last BM Date : 01/03/24 Baseline Weight: Weight: 57.9 kg Most recent weight: Weight: 60.2 kg     Palliative  Assessment/Data:     Time In: 0830 Time Out: 0945 Time Total: 75 minutes  Greater than 50%  of this time was spent counseling and coordinating care related to the above assessment and plan.  Signed by: Katheran Awe, NP   Please contact Palliative Medicine Team phone at [phone] for questions and concerns.  For individual provider: See Loretha Stapler

## 2024-01-06 NOTE — Progress Notes (Signed)
 RE:can [name] have something to help him relax he is very active and agitated. Can not sleep  Valium 1.25 mg x 1.

## 2024-01-06 NOTE — Progress Notes (Signed)
 Contacted Dr. Allena Katz because patient is with increased agitation pulling off covers throwing legs over the side of the bed unable to rest playing with his foley. Medicated with tylenol earlier for complaints of back and leg pain with minimal relief

## 2024-01-06 NOTE — Progress Notes (Signed)
 [facility] Liaison Note  Received request from Lurena Joiner, Transitions of Care Manager, for hospice services at home after discharge. Spoke with [name], Daughter, to initiate education related to hospice philosophy, services, and team approach to care. Patient/family verbalized understanding of information given. Per discussion, the plan is for discharge home likely tomorrow.   DME needs discussed. Patient has a cane, wheelchair, walker, bedside commode, shower chair and oxygen in the home. Patient/family requests the following equipment for delivery: hospital bed and overbed table. . The address has been verified and is correct in the chart. [name] is the family contact to arrange time of equipment delivery.  Please send signed and completed DNR home with patient/family. Please provide prescriptions at discharge as needed to ensure ongoing symptom management.  [facility] information and contact numbers given to [place]. Above information shared with Lurena Joiner, Transitions of Care Manager. Please call with any questions or concerns.   Thank you for the opportunity to participate in this patient's care.   Glenna Fellows BSN, Charity fundraiser, OCN [employer] [phone]

## 2024-01-07 DIAGNOSIS — N184 Chronic kidney disease, stage 4 (severe): Secondary | ICD-10-CM | POA: Diagnosis not present

## 2024-01-07 DIAGNOSIS — G9341 Metabolic encephalopathy: Secondary | ICD-10-CM | POA: Diagnosis not present

## 2024-01-07 DIAGNOSIS — N179 Acute kidney failure, unspecified: Secondary | ICD-10-CM | POA: Diagnosis not present

## 2024-01-07 DIAGNOSIS — R627 Adult failure to thrive: Secondary | ICD-10-CM | POA: Diagnosis not present

## 2024-01-07 NOTE — TOC Transition Note (Signed)
 Transition of Care [facility]) - Discharge Note   Patient Details  Name: [name] MRN: [id number] Date of Birth: 06/06/1933  Transition of Care [facility]) CM/SW Contact:  Villa Herb, LCSWA Phone Number: 01/07/2024, 2:30 PM   Clinical Narrative:    CSW spoke to [place] with [facility] who states DME was to be delivered at noon. Morrie Sheldon states hospice RN will be out tomorrow to see pt at the home. CSW spoke with pts daughter to confirm DME delivery, it has made it to the home. MD updated and will complete D/C. RN updated that EMS will be called, med necessity sent to nurses station for RN. TOC singing off.   Final next level of care: Home w Hospice Care Barriers to Discharge: Barriers Resolved   Patient Goals and CMS Choice Patient states their goals for this hospitalization and ongoing recovery are:: reutrn home CMS Medicare.gov Compare Post Acute Care list provided to:: Patient Represenative (must comment) Choice offered to / list presented to : Adult Children      Discharge Placement                  Name of family member notified: daughter Patient and family notified of of transfer: 01/07/24  Discharge Plan and Services Additional resources added to the After Visit Summary for   In-house Referral: Clinical Social Work   Post Acute Care Choice: Home Health, Resumption of Svcs/PTA Provider                      HH Agency:  [facility]) Date [facility] Agency Contacted: 01/06/24 Time HH Agency Contacted: 1153 Representative spoke with at [facility] Agency: Gardiner Ramus, Shawn  Social Drivers of Health (SDOH) Interventions SDOH Screenings   Food Insecurity: No Food Insecurity (01/04/2024)  Housing: Low Risk  (01/04/2024)  Transportation Needs: No Transportation Needs (01/04/2024)  Utilities: Not At Risk (01/04/2024)  Depression (PHQ2-9): Low Risk  (02/23/2023)  Social Connections: Unknown (01/04/2024)  Tobacco Use: Medium Risk (01/06/2024)     Readmission Risk  Interventions    01/04/2024    1:45 PM 11/17/2023    1:40 PM 10/26/2023   11:45 AM  Readmission Risk Prevention Plan  Transportation Screening Complete Complete Complete  Medication Review Oceanographer) Complete Complete   PCP or Specialist appointment within 3-5 days of discharge   Complete  HRI or Home Care Consult Complete Complete Complete  SW Recovery Care/Counseling Consult Complete Complete Complete  Palliative Care Screening Not Applicable Not Applicable Not Applicable  Skilled Nursing Facility Not Applicable Complete Complete

## 2024-01-07 NOTE — Progress Notes (Signed)
 Patient ID: [name], male   DOB: [birth year], 89 y.o.   MRN: [id number] Called patient daughter and reviewed AVS over the [facility]. Placed patient's DNR and other paperwork from home in folder with AVS. Lidia Collum, RN

## 2024-01-07 NOTE — Discharge Summary (Signed)
 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 [facility] Course: 89 y.o. M with dementia lives at home, wheelchair bound, lung CA metastatic to LN and right chest wall, chronic respiratory failure on 2L home O2, CKD IV baseline 2.4, chronic urinary retention with indwelling foley, HTN, and hypothyroidism who presented with AMS.  In the ER, Cr back up to 3.5. CTH normal. Admitted on fluids.  Assessment and Plan:  Acute kidney injury superimposed on stage 4 chronic kidney disease (HCC) Baseline Cr appears to be in the 2.2-2.6 range in the last year.    -Creatinine has improved to 2.94 -Will discontinue Lasix       Active Problems:   Generalized weakness   Acute metabolic encephalopathy At baseline he is responsive to questions, conversational.   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  At baseline, patient lives alone,  requires assistance to get out of bed and transfer to toilet/wheelchair, and cannot walk.        He has lost 20% body weight since last 8 months (15 kg) and now has BMI of 17 and clearly is not eating enough due to his dementia and cancer, despite loving attention by family.   Given his pattern of now fourth readmission in 3 months, if there is not a readily identifiable cause of his current condition (ie a stroke or bacteremia), option for hospice was discussed with patient's daughter - Palliative care consulted -Patient to go home with home hospice       Anemia of chronic disease   Essential hypertension   Hypothyroidism   Chronic respiratory failure with hypoxia (HCC)   Chronic urinary retention            Consultants:  Procedures performed:  Disposition: Home Diet recommendation:  Discharge Diet Orders (From admission, onward)     Start     Ordered   01/07/24 0000  Diet - low sodium heart healthy        01/07/24 1428           Regular diet DISCHARGE MEDICATION: Allergies as of 01/07/2024       Reactions   Nsaids Other (See Comments)   Decreased renal function        Medication List     STOP taking these medications    furosemide 40 MG tablet Commonly known as: Lasix       TAKE  these medications    acetaminophen 325 MG tablet Commonly known as: TYLENOL Take 2 tablets (650 mg total) by mouth every 6 (six) hours as needed for mild pain (pain score 1-3), fever or headache (or Fever >/= 101).   amLODipine 10 MG tablet Commonly known as: NORVASC Take 1 tablet (10 mg total) by mouth daily. Hold for blood pressure reading <130   Breo Ellipta 100-25 MCG/ACT Aepb Generic drug: fluticasone furoate-vilanterol Inhale 1 puff into the lungs daily.   buPROPion 100 MG tablet Commonly known as: WELLBUTRIN Take 1 tablet (100 mg total) by mouth 2 (two) times daily.   Calcium Carbonate Antacid 200 MG Tbdp Take 200 mg by mouth daily.   ferrous sulfate  325 (65 FE) MG EC tablet Take 1 tablet (325 mg total) by mouth every Monday, Wednesday, and Friday.   gabapentin 250 MG/5ML solution Commonly known as: NEURONTIN Take 3 mLs (150 mg total) by mouth at bedtime.   Incruse Ellipta 62.5 MCG/ACT Aepb Generic drug: umeclidinium bromide Inhale 1 puff into the lungs daily.   labetalol 200 MG tablet Commonly known as: NORMODYNE Take 1 tablet (200 mg total) by mouth 2 (two) times daily. Hold for blood pressure reading <130   levothyroxine 150 MCG tablet Commonly known as: SYNTHROID Take 1 tablet (150 mcg total) by mouth daily.   polyethylene glycol 17 g packet Commonly known as: MIRALAX / GLYCOLAX Take 17 g by mouth daily.   ProSource Liqd Take 30 mLs by mouth 3 (three) times daily.   senna-docusate 8.6-50 MG tablet Commonly known as: Senokot-S Take 2 tablets by mouth at bedtime.   tamsulosin 0.4 MG Caps capsule Commonly known as: FLOMAX Take 1 capsule (0.4 mg total) by mouth daily after supper.        Follow-up Information     [facility] Follow up.   Why: Home with Hospice nurse will call to set a time to meet you at home.               Discharge Exam: Filed Weights   01/03/24 1722 01/04/24 0218  Weight: 57.9 kg 60.2 kg   General-appears in no acute distress Heart-S1-S2, regular, no murmur auscultated Lungs-clear to auscultation bilaterally, no wheezing or crackles auscultated Abdomen-soft, nontender, no organomegaly Extremities-no edema in the lower extremities Neuro-alert, oriented x3, no focal deficit noted  Condition at discharge: good  The results of significant diagnostics from this hospitalization (including imaging, microbiology, ancillary and laboratory) are listed below for reference.   Imaging Studies: MR BRAIN WO CONTRAST Result Date: 01/05/2024 CLINICAL DATA:  Initial evaluation for mental status change, unknown cause. EXAM: MRI HEAD WITHOUT CONTRAST TECHNIQUE: Multiplanar, multiecho pulse  sequences of the brain and surrounding structures were obtained without intravenous contrast. COMPARISON:  CT from 12/03/2023. FINDINGS: Brain: Cerebral volume within normal limits for age. Patchy and confluent T2/FLAIR hyperintensity involving the periventricular deep white matter both cerebral hemispheres, consistent with chronic small vessel ischemic disease, moderately advanced in nature. No abnormal foci of restricted diffusion to suggest acute or subacute ischemia. Gray-white matter differentiation maintained. No acute or chronic intracranial blood products. No mass lesion, midline shift or mass effect. No hydrocephalus or extra-axial fluid collection. Pituitary gland within normal limits. Vascular: Major intracranial vascular flow voids are maintained. Skull and upper cervical spine: Craniocervical junction within normal limits. Bone marrow signal intensity normal. No scalp soft tissue abnormality. Sinuses/Orbits: Globes orbital soft tissues within normal limits. Mild scattered mucosal thickening present about the paranasal sinuses. No significant mastoid effusion.  Other: None. IMPRESSION: 1. No acute intracranial abnormality. 2. Moderately advanced chronic microvascular ischemic disease. Electronically Signed   By: Rise Mu M.D.   On: 01/05/2024 05:14   DG Chest Portable 1 View Result Date: 01/03/2024 CLINICAL DATA:  Cough altered EXAM: PORTABLE CHEST 1 VIEW COMPARISON:  12/26/2023, CT chest 10/25/2023, chest x-ray 07/07/2023, 10/21/2020, 10/25/2023 FINDINGS: Emphysema. Stable distortion and scarring in the right upper lung foot presumably related to post therapeutic change. Asymmetrical density in the right hilus, also without significant change. Stable cardiomediastinal silhouette with aortic atherosclerosis. No pneumothorax IMPRESSION: Emphysema with stable asymmetrical density in the right hilus and scarring and distortion in the right upper lung compared to prior. No acute airspace disease  Electronically Signed   By: Jasmine Pang M.D.   On: 01/03/2024 23:02   CT HEAD WO CONTRAST Result Date: 01/03/2024 CLINICAL DATA:  Altered mental status EXAM: CT HEAD WITHOUT CONTRAST TECHNIQUE: Contiguous axial images were obtained from the base of the skull through the vertex without intravenous contrast. RADIATION DOSE REDUCTION: This exam was performed according to the departmental dose-optimization program which includes automated exposure control, adjustment of the mA and/or kV according to patient size and/or use of iterative reconstruction technique. COMPARISON:  CT brain 11/16/2023, 12/26/2023 FINDINGS: Brain: No acute territorial infarction, hemorrhage or intracranial mass. Atrophy and chronic small vessel ischemic changes of the white matter. Stable ventricle size Vascular: No hyperdense vessels.  Carotid vascular calcification Skull: Normal. Negative for fracture or focal lesion. Sinuses/Orbits: No acute finding. Other: None IMPRESSION: 1. No CT evidence for acute intracranial abnormality. 2. Atrophy and chronic small vessel ischemic changes of the white matter. Electronically Signed   By: Jasmine Pang M.D.   On: 01/03/2024 22:59   CT Head Wo Contrast Result Date: 12/26/2023 CLINICAL DATA:  Memory loss, altered mental status EXAM: CT HEAD WITHOUT CONTRAST TECHNIQUE: Contiguous axial images were obtained from the base of the skull through the vertex without intravenous contrast. RADIATION DOSE REDUCTION: This exam was performed according to the departmental dose-optimization program which includes automated exposure control, adjustment of the mA and/or kV according to patient size and/or use of iterative reconstruction technique. COMPARISON:  11/16/2023 FINDINGS: Brain: There is atrophy and chronic small vessel disease changes. No acute intracranial abnormality. Specifically, no hemorrhage, hydrocephalus, mass lesion, acute infarction, or significant intracranial injury. Vascular: No hyperdense  vessel or unexpected calcification. Skull: No acute calvarial abnormality. Sinuses/Orbits: No acute findings Other: None IMPRESSION: Atrophy, chronic microvascular disease. No acute intracranial abnormality. Electronically Signed   By: Charlett Nose M.D.   On: 12/26/2023 19:09   DG Chest Port 1 View Result Date: 12/26/2023 CLINICAL DATA:  weak EXAM: PORTABLE CHEST 1 VIEW COMPARISON:  November 16, 2023, October 25, 2023 FINDINGS: The cardiomediastinal silhouette is unchanged in contour.Atherosclerotic calcifications. No pleural effusion. No pneumothorax. Similar band like peripheral opacity of the RIGHT upper lobe with a rounded RIGHT hilar contour. Emphysematous changes. No acute pleuroparenchymal abnormality. IMPRESSION: 1. No acute cardiopulmonary abnormality. 2. Similar band like peripheral opacity of the RIGHT upper lobe with a rounded RIGHT hilar contour compared to prior and previously evaluated on chest CT dated October 25, 2023 Electronically Signed   By: Meda Klinefelter M.D.   On: 12/26/2023 18:39    Microbiology: Results for orders placed or performed during the hospital encounter of 01/03/24  Resp panel by RT-PCR (RSV, Flu A&B, Covid) Anterior Nasal Swab     Status: None   Collection Time: 01/03/24 10:26 PM   Specimen: Anterior  Nasal Swab  Result Value Ref Range Status   SARS Coronavirus 2 by RT PCR NEGATIVE NEGATIVE Final    Comment: (NOTE) SARS-CoV-2 target nucleic acids are NOT DETECTED.  The SARS-CoV-2 RNA is generally detectable in upper respiratory specimens during the acute phase of infection. The lowest concentration of SARS-CoV-2 viral copies this assay can detect is 138 copies/mL. A negative result does not preclude SARS-Cov-2 infection and should not be used as the sole basis for treatment or other patient management decisions. A negative result may occur with  improper specimen collection/handling, submission of specimen other than nasopharyngeal swab, presence of  viral mutation(s) within the areas targeted by this assay, and inadequate number of viral copies(<138 copies/mL). A negative result must be combined with clinical observations, patient history, and epidemiological information. The expected result is Negative.  Fact Sheet for Patients:  BloggerCourse.com  Fact Sheet for Healthcare Providers:  SeriousBroker.it  This test is no t yet approved or cleared by the [place] FDA and  has been authorized for detection and/or diagnosis of SARS-CoV-2 by FDA under an Emergency Use Authorization (EUA). This EUA will remain  in effect (meaning this test can be used) for the duration of the COVID-19 declaration under Section 564(b)(1) of the Act, 21 U.S.C.section 360bbb-3(b)(1), unless the authorization is terminated  or revoked sooner.       Influenza A by PCR NEGATIVE NEGATIVE Final   Influenza B by PCR NEGATIVE NEGATIVE Final    Comment: (NOTE) The Xpert Xpress SARS-CoV-2/FLU/RSV plus assay is intended as an aid in the diagnosis of influenza from Nasopharyngeal swab specimens and should not be used as a sole basis for treatment. Nasal washings and aspirates are unacceptable for Xpert Xpress SARS-CoV-2/FLU/RSV testing.  Fact Sheet for Patients: BloggerCourse.com  Fact Sheet for Healthcare Providers: SeriousBroker.it  This test is not yet approved or cleared by the [place] FDA and has been authorized for detection and/or diagnosis of SARS-CoV-2 by FDA under an Emergency Use Authorization (EUA). This EUA will remain in effect (meaning this test can be used) for the duration of the COVID-19 declaration under Section 564(b)(1) of the Act, 21 U.S.C. section 360bbb-3(b)(1), unless the authorization is terminated or revoked.     Resp Syncytial Virus by PCR NEGATIVE NEGATIVE Final    Comment: (NOTE) Fact Sheet for  Patients: BloggerCourse.com  Fact Sheet for Healthcare Providers: SeriousBroker.it  This test is not yet approved or cleared by the [place] FDA and has been authorized for detection and/or diagnosis of SARS-CoV-2 by FDA under an Emergency Use Authorization (EUA). This EUA will remain in effect (meaning this test can be used) for the duration of the COVID-19 declaration under Section 564(b)(1) of the Act, 21 U.S.C. section 360bbb-3(b)(1), unless the authorization is terminated or revoked.  Performed at [facility], [address] [place], [place] [address]   Remove and replace urinary cath (placed > 5 days) then obtain urine culture from new indwelling urinary catheter.     Status: None   Collection Time: 01/03/24 11:57 PM   Specimen: Urine, Catheterized  Result Value Ref Range Status   Specimen Description   Final    URINE, CATHETERIZED Performed at [facility], [address] [place], [place] [address]    Special Requests   Final    NONE Performed at [facility], [address], [place], [place] [address]    Culture   Final    NO GROWTH Performed at [facility], [address]  [place], [place] [address]    Report Status 01/05/2024 FINAL  Final  Culture, blood (Routine X 2) w Reflex to ID Panel     Status: None (Preliminary result)   Collection Time: 01/04/24  9:16 AM   Specimen: BLOOD  Result Value Ref Range Status   Specimen Description BLOOD BLOOD LEFT ARM  Final   Special Requests   Final    BOTTLES DRAWN AEROBIC AND ANAEROBIC Blood Culture adequate volume   Culture   Final    NO GROWTH 3 DAYS Performed at [facility], [address] [place], [place] [address]    Report Status PENDING  Incomplete  Culture, blood (Routine X 2) w Reflex to ID Panel     Status: None (Preliminary result)   Collection Time: 01/04/24  9:20 AM   Specimen: BLOOD  Result Value Ref Range Status   Specimen Description BLOOD  BLOOD LEFT ARM  Final   Special Requests   Final    BOTTLES DRAWN AEROBIC AND ANAEROBIC Blood Culture adequate volume   Culture   Final    NO GROWTH 3 DAYS Performed at [facility], [address], [place], [place] [address]    Report Status PENDING  Incomplete  Remove and replace urinary cath (placed > 5 days) then obtain urine culture from new indwelling urinary catheter.     Status: None   Collection Time: 01/04/24 10:17 AM   Specimen: Urine, Catheterized  Result Value Ref Range Status   Specimen Description   Final    URINE, CATHETERIZED Performed at [facility], [address] [place], [place] [address]    Special Requests   Final    NONE Performed at [facility], [address], [place], [place] [address]    Culture   Final    NO GROWTH Performed at [facility], [address], [place], [place] [address]    Report Status 01/05/2024 FINAL  Final    Labs: CBC: Recent Labs  Lab 01/03/24 2054 01/05/24 0420  WBC 3.9* 2.7*  NEUTROABS 2.3  --   HGB 11.4* 9.4*  HCT 36.2* 30.8*  MCV 94.3 93.9  PLT 156 152   Basic Metabolic Panel: Recent Labs  Lab 01/03/24 2054 01/05/24 0420  NA 138 139  K 4.8 4.4  CL 105 109  CO2 22 22  GLUCOSE 96 70  BUN 55* 49*  CREATININE 3.56* 2.94*  CALCIUM 9.2 8.4*  MG  --  2.0  PHOS  --  3.2   Liver Function Tests: Recent Labs  Lab 01/03/24 2054 01/05/24 0420  AST 33 19  ALT 43 28  ALKPHOS 90 75  BILITOT 0.7 0.6  PROT 7.8 6.2*  ALBUMIN 3.5 2.7*   CBG: Recent Labs  Lab 01/03/24 1727  GLUCAP 85    Discharge time spent: greater than 30 minutes.  Signed: [name], MD [facility] 01/07/2024

## 2024-01-07 NOTE — Progress Notes (Signed)
 [facility]  PROGRESS NOTE  [name] BJY:[id number] DOB: [birth year] DOA: 01/03/2024 PCP: [name], MD   Brief HPI:   89 y.o. M with dementia lives at home, wheelchair bound, lung CA metastatic to LN and right chest wall, chronic respiratory failure on 2L home O2, CKD IV baseline 2.4, chronic urinary retention with indwelling foley, HTN, and hypothyroidism who presented with AMS.   In the ER, Cr back up to 3.5. CTH normal. Admitted on fluids.    Assessment/Plan:    Acute kidney injury superimposed on stage 4 chronic kidney disease (HCC) Baseline Cr appears to be in the 2.2-2.6 range in the last year.    -Creatinine has improved to 2.94       Active Problems:   Generalized weakness   Acute metabolic encephalopathy At baseline he is responsive to questions, conversational.   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  At baseline, patient lives alone, requires assistance to get out of bed and transfer to toilet/wheelchair, and cannot walk.     Family check on him frequently and provide him food.  He has dementia they know, but recognizes them and they report he is oriented to place and time usually (notes from Dr. Alwyn Ren at [facility] over the last 4 months describe an individual who typically is not aware of his surroundings, or recent events, not a reliable historian, and so I am unable to reconcile the discrepancy at present).   He has lost 20% body weight since last 8 months (15 kg) and now has BMI of 17 and clearly is not eating enough due to his dementia and cancer, despite loving attention by family.   Given his pattern of now fourth readmission in 3 months, if there is not a readily  identifiable cause of his current condition (ie a stroke or bacteremia), Dr. Maryfrances Bunnell  told daughter/POA [name] that I suspect this is his body failing to thrive in the setting of metastatic cancer, dementia, weight loss and advanced renal failure and I would recommend either home with hospice or long term care placement with palliative care following. - Palliative care consulted -Plan to go home with home hospice.       Anemia of chronic disease   Essential hypertension   Hypothyroidism   Chronic respiratory failure with hypoxia (HCC)   Chronic urinary retention   - Continue amlodipine, Breo, Ensure, Labetalol, levothyroxine, Seroquel, Flomax, Incruse   Medications     amLODipine  10 mg Oral Daily   buPROPion  100 mg Oral BID   Chlorhexidine Gluconate Cloth  6 each Topical Q0600   feeding supplement  237 mL Oral TID BM   ferrous sulfate  325 mg Oral Q M,W,F   fluticasone furoate-vilanterol  1 puff Inhalation Daily   gabapentin  150 mg Oral QHS   heparin  5,000 Units Subcutaneous Q8H   labetalol  200 mg Oral BID   levothyroxine  150 mcg Oral Daily   polyethylene glycol  17 g Oral Daily   QUEtiapine  25 mg Oral QHS   tamsulosin  0.4 mg Oral QPC supper   umeclidinium bromide  1 puff Inhalation Daily     Data Reviewed:  CBG:  Recent Labs  Lab 01/03/24 1727  GLUCAP 85    SpO2: 100 % O2 Flow Rate (L/min): 2 L/min    Vitals:   01/06/24 0951 01/06/24 1226 01/06/24 2115 01/07/24 0453  BP: (!) 158/80 123/62 (!) 144/76 138/77  Pulse: 61 (!) 53 62 66  Resp:  16 20 16   Temp:  97.9 F (36.6 C) 98.1 F (36.7 C) 98.4 F (36.9 C)  TempSrc:  Oral Oral   SpO2:  100% 100% 100%  Weight:      Height:          Data Reviewed:  Basic Metabolic Panel: Recent Labs  Lab 01/03/24 2054 01/05/24 0420  NA 138 139  K 4.8 4.4  CL 105 109  CO2 22 22  GLUCOSE 96 70  BUN 55* 49*  CREATININE 3.56* 2.94*  CALCIUM 9.2 8.4*  MG  --  2.0  PHOS  --  3.2    CBC: Recent Labs   Lab 01/03/24 2054 01/05/24 0420  WBC 3.9* 2.7*  NEUTROABS 2.3  --   HGB 11.4* 9.4*  HCT 36.2* 30.8*  MCV 94.3 93.9  PLT 156 152    LFT Recent Labs  Lab 01/03/24 2054 01/05/24 0420  AST 33 19  ALT 43 28  ALKPHOS 90 75  BILITOT 0.7 0.6  PROT 7.8 6.2*  ALBUMIN 3.5 2.7*     Antibiotics: Anti-infectives (From admission, onward)    Start     Dose/Rate Route Frequency Ordered Stop   01/04/24 0945  cefTRIAXone (ROCEPHIN) 1 g in sodium chloride 0.9 % 100 mL IVPB  Status:  Discontinued        1 g 200 mL/hr over 30 Minutes Intravenous Every 24 hours 01/04/24 0854 01/06/24 1339        DVT prophylaxis: SCDs  Code Status: DNR  Family Communication:    CONSULTS    Subjective   Denies any complaints.   Objective    Physical Examination:  Appears in no acute distress S1-S2, regular Lungs clear to auscultation bilaterally Abdomen is soft, nontender, no organomegaly  Status is: Inpatient:             [name]   [facility] If 7PM-7AM, please contact night-coverage at [email or web address], Office  [phone]   01/07/2024, 8:35 AM  LOS: 4 days

## 2024-01-07 NOTE — Care Management Important Message (Signed)
 Important Message  Patient Details  Name: [name] MRN: [id number] Date of Birth: 1933/09/13   Important Message Given:  Yes - Medicare IM     Corey Harold 01/07/2024, 3:03 PM

## 2024-01-07 NOTE — Plan of Care (Signed)
 Patient ID: [name], male   DOB: [birth year], 89 y.o.   MRN: [id number]  Problem: Education: Goal: Knowledge of General Education information will improve Description: Including pain rating scale, medication(s)/side effects and non-pharmacologic comfort measures Outcome: Adequate for Discharge   Problem: Health Behavior/Discharge Planning: Goal: Ability to manage health-related needs will improve Outcome: Adequate for Discharge   Problem: Clinical Measurements: Goal: Ability to maintain clinical measurements within normal limits will improve Outcome: Adequate for Discharge Goal: Will remain free from infection Outcome: Adequate for Discharge Goal: Diagnostic test results will improve Outcome: Adequate for Discharge Goal: Respiratory complications will improve Outcome: Adequate for Discharge Goal: Cardiovascular complication will be avoided Outcome: Adequate for Discharge   Problem: Activity: Goal: Risk for activity intolerance will decrease Outcome: Adequate for Discharge   Problem: Nutrition: Goal: Adequate nutrition will be maintained Outcome: Adequate for Discharge   Problem: Coping: Goal: Level of anxiety will decrease Outcome: Adequate for Discharge   Problem: Elimination: Goal: Will not experience complications related to bowel motility Outcome: Adequate for Discharge Goal: Will not experience complications related to urinary retention Outcome: Adequate for Discharge   Problem: Pain Managment: Goal: General experience of comfort will improve and/or be controlled Outcome: Adequate for Discharge   Problem: Safety: Goal: Ability to remain free from injury will improve Outcome: Adequate for Discharge   Problem: Skin Integrity: Goal: Risk for impaired skin integrity will decrease Outcome: Adequate for Discharge   Problem: Safety: Goal: Non-violent Restraint(s) Outcome: Adequate for Discharge    Lidia Collum, RN

## 2024-01-08 DIAGNOSIS — R6889 Other general symptoms and signs: Secondary | ICD-10-CM | POA: Diagnosis not present

## 2024-01-08 DIAGNOSIS — Z7401 Bed confinement status: Secondary | ICD-10-CM | POA: Diagnosis not present

## 2024-01-09 LAB — CULTURE, BLOOD (ROUTINE X 2)
Culture: NO GROWTH
Culture: NO GROWTH
Special Requests: ADEQUATE
Special Requests: ADEQUATE

## 2024-01-10 ENCOUNTER — Telehealth: Payer: Self-pay | Admitting: *Deleted

## 2024-01-10 ENCOUNTER — Inpatient Hospital Stay: Payer: Medicare HMO | Admitting: Internal Medicine

## 2024-01-10 NOTE — Transitions of Care (Post Inpatient/ED Visit) (Signed)
   01/10/2024  Name: [name] MRN: [id number] DOB: 03/14/1933  Today's TOC FU Call Status: Today's TOC FU Call Status:: Successful TOC FU Call Completed TOC FU Call Complete Date: 01/10/24 Patient's Name and Date of Birth confirmed.  Transition Care Management Follow-up Telephone Call Date of Discharge: 01/07/24 Discharge [facility]: [facility] Type of Discharge: Inpatient Admission Primary Inpatient Discharge Diagnosis:: Acute kidney injury superimposed on stage 4 chronic kidney disease How have you been since you were released from the hospital?: Same Any questions or concerns?: Yes Patient Questions/Concerns:: Patient fell out of bed. Foley Catheter pulled apart. Patient Questions/Concerns Addressed: Other: (Patient daughter called hospice Nurse and they are coming to the [facility])  Items Reviewed: Did you receive and understand the discharge instructions provided?: Yes Medications obtained,verified, and reconciled?: No   RN verified that [facility] nurse has started coming to the home.     Una Ganser BSN RN  [facility] Health Care Management Coordinator [email or web address] Direct Dial: [phone]  [email or web address]: [phone] Website: [email or web address]

## 2024-01-19 ENCOUNTER — Ambulatory Visit

## 2024-02-27 DEATH — deceased

## 2024-04-21 ENCOUNTER — Other Ambulatory Visit: Payer: Self-pay | Admitting: Physician Assistant

## 2024-06-22 ENCOUNTER — Encounter: Payer: Self-pay | Admitting: Physician Assistant
